# Patient Record
Sex: Male | Born: 1960
Health system: Southern US, Community
[De-identification: ages and names within clinical notes are randomized; demographics above are authoritative.]

## PROBLEM LIST (undated history)

## (undated) DIAGNOSIS — R609 Edema, unspecified: Secondary | ICD-10-CM

## (undated) DIAGNOSIS — I712 Thoracic aortic aneurysm, without rupture, unspecified: Secondary | ICD-10-CM

## (undated) DIAGNOSIS — R600 Localized edema: Secondary | ICD-10-CM

## (undated) DIAGNOSIS — C3490 Malignant neoplasm of unspecified part of unspecified bronchus or lung: Secondary | ICD-10-CM

## (undated) DIAGNOSIS — I38 Endocarditis, valve unspecified: Secondary | ICD-10-CM

## (undated) DIAGNOSIS — T7840XA Allergy, unspecified, initial encounter: Secondary | ICD-10-CM

## (undated) HISTORY — DX: Thoracic aortic aneurysm, without rupture: I71.2

## (undated) HISTORY — PX: TONSILLECTOMY: SUR1361

## (undated) HISTORY — PX: ELBOW SURGERY: SHX618

## (undated) HISTORY — DX: Malignant neoplasm of unspecified part of unspecified bronchus or lung: C34.90

## (undated) HISTORY — DX: Localized edema: R60.0

## (undated) HISTORY — DX: Allergy, unspecified, initial encounter: T78.40XA

## (undated) HISTORY — DX: Endocarditis, valve unspecified: I38

## (undated) HISTORY — DX: Edema, unspecified: R60.9

## (undated) HISTORY — DX: Thoracic aortic aneurysm, without rupture, unspecified: I71.20

---

## 2009-12-03 HISTORY — PX: ELBOW SURGERY: SHX618

## 2011-01-24 ENCOUNTER — Ambulatory Visit: Payer: Self-pay | Admitting: Otolaryngology

## 2011-01-25 ENCOUNTER — Ambulatory Visit: Payer: Self-pay | Admitting: Cardiothoracic Surgery

## 2011-01-26 ENCOUNTER — Ambulatory Visit: Payer: Self-pay | Admitting: Internal Medicine

## 2011-01-30 ENCOUNTER — Ambulatory Visit: Payer: Self-pay | Admitting: Internal Medicine

## 2011-02-01 ENCOUNTER — Ambulatory Visit: Payer: Self-pay | Admitting: Cardiothoracic Surgery

## 2011-02-01 HISTORY — PX: PORTACATH PLACEMENT: SHX2246

## 2011-02-01 HISTORY — PX: OTHER SURGICAL HISTORY: SHX169

## 2011-02-02 ENCOUNTER — Ambulatory Visit: Payer: Self-pay | Admitting: Cardiothoracic Surgery

## 2011-02-07 ENCOUNTER — Inpatient Hospital Stay: Payer: Self-pay | Admitting: Cardiothoracic Surgery

## 2011-02-13 LAB — CEA: CEA: 17.2 ng/mL — ABNORMAL HIGH (ref 0.0–4.7)

## 2011-02-13 LAB — AFP TUMOR MARKER: AFP-Tumor Marker: 3.3 ng/mL (ref 0.0–8.3)

## 2011-02-13 LAB — PSA

## 2011-02-13 LAB — BETA HCG QUANT (REF LAB)

## 2011-02-26 ENCOUNTER — Ambulatory Visit: Payer: Self-pay | Admitting: Otolaryngology

## 2011-03-04 ENCOUNTER — Ambulatory Visit: Payer: Self-pay | Admitting: Cardiothoracic Surgery

## 2011-04-03 ENCOUNTER — Ambulatory Visit: Payer: Self-pay | Admitting: Cardiothoracic Surgery

## 2011-05-04 ENCOUNTER — Ambulatory Visit: Payer: Self-pay | Admitting: Cardiothoracic Surgery

## 2011-06-03 ENCOUNTER — Ambulatory Visit: Payer: Self-pay | Admitting: Cardiothoracic Surgery

## 2011-07-04 ENCOUNTER — Ambulatory Visit: Payer: Self-pay | Admitting: Cardiothoracic Surgery

## 2011-08-04 ENCOUNTER — Ambulatory Visit: Payer: Self-pay | Admitting: Cardiothoracic Surgery

## 2011-09-03 ENCOUNTER — Ambulatory Visit: Payer: Self-pay | Admitting: Cardiothoracic Surgery

## 2011-10-04 ENCOUNTER — Ambulatory Visit: Payer: Self-pay | Admitting: Cardiothoracic Surgery

## 2011-11-03 ENCOUNTER — Ambulatory Visit: Payer: Self-pay | Admitting: Cardiothoracic Surgery

## 2011-12-04 ENCOUNTER — Ambulatory Visit: Payer: Self-pay | Admitting: Cardiothoracic Surgery

## 2011-12-07 LAB — CBC CANCER CENTER
Basophil #: 0 x10 3/mm (ref 0.0–0.1)
Basophil %: 0.1 %
Eosinophil #: 0 x10 3/mm (ref 0.0–0.7)
Eosinophil %: 0 %
HCT: 41 % (ref 40.0–52.0)
HGB: 14.2 g/dL (ref 13.0–18.0)
Lymphocyte #: 0.8 x10 3/mm — ABNORMAL LOW (ref 1.0–3.6)
Lymphocyte %: 10.3 %
MCH: 31.8 pg (ref 26.0–34.0)
MCV: 92 fL (ref 80–100)
Monocyte #: 0.4 x10 3/mm (ref 0.0–0.7)
Neutrophil #: 6.2 x10 3/mm (ref 1.4–6.5)
Neutrophil %: 84 %
Platelet: 176 x10 3/mm (ref 150–440)
RBC: 4.48 10*6/uL (ref 4.40–5.90)
WBC: 7.3 x10 3/mm (ref 3.8–10.6)

## 2011-12-07 LAB — BASIC METABOLIC PANEL
Anion Gap: 9 (ref 7–16)
Calcium, Total: 9.4 mg/dL (ref 8.5–10.1)
Chloride: 97 mmol/L — ABNORMAL LOW (ref 98–107)
Co2: 29 mmol/L (ref 21–32)
Creatinine: 1.15 mg/dL (ref 0.60–1.30)
EGFR (African American): >60
Osmolality: 277 (ref 275–301)
Potassium: 3.9 mmol/L (ref 3.5–5.1)
Sodium: 135 mmol/L — ABNORMAL LOW (ref 136–145)

## 2011-12-07 LAB — HEPATIC FUNCTION PANEL A (ARMC)
Albumin: 3.9 g/dL (ref 3.4–5.0)
Alkaline Phosphatase: 74 U/L (ref 50–136)
Bilirubin, Direct: 0.1 mg/dL (ref 0.00–0.20)
Bilirubin,Total: 0.5 mg/dL (ref 0.2–1.0)
Total Protein: 7.8 g/dL (ref 6.4–8.2)

## 2011-12-07 LAB — CREATININE, URINE, RANDOM: Creatinine, Urine Random: 87.3 mg/dL (ref 30.0–125.0)

## 2011-12-27 LAB — CBC CANCER CENTER
Basophil %: 0 %
Eosinophil %: 0 %
HGB: 14.6 g/dL (ref 13.0–18.0)
Lymphocyte %: 9.5 %
MCV: 92 fL (ref 80–100)
Neutrophil %: 86.6 %
RBC: 4.62 10*6/uL (ref 4.40–5.90)
RDW: 14.2 % (ref 11.5–14.5)
WBC: 7.9 x10 3/mm (ref 3.8–10.6)

## 2011-12-27 LAB — COMPREHENSIVE METABOLIC PANEL
Albumin: 3.9 g/dL (ref 3.4–5.0)
Anion Gap: 13 (ref 7–16)
BUN: 20 mg/dL — ABNORMAL HIGH (ref 7–18)
Bilirubin,Total: 0.5 mg/dL (ref 0.2–1.0)
Calcium, Total: 9.3 mg/dL (ref 8.5–10.1)
Chloride: 100 mmol/L (ref 98–107)
Co2: 24 mmol/L (ref 21–32)
EGFR (African American): >60
EGFR (Non-African Amer.): >60
Potassium: 3.9 mmol/L (ref 3.5–5.1)
SGOT(AST): 21 U/L (ref 15–37)
Sodium: 137 mmol/L (ref 136–145)

## 2011-12-27 LAB — MAGNESIUM: Magnesium: 1.8 mg/dL

## 2012-01-04 ENCOUNTER — Ambulatory Visit: Payer: Self-pay | Admitting: Cardiothoracic Surgery

## 2012-01-18 LAB — CBC CANCER CENTER
Basophil #: 0 x10 3/mm (ref 0.0–0.1)
Basophil %: 0.2 %
Eosinophil %: 0 %
HCT: 41.7 % (ref 40.0–52.0)
HGB: 14.4 g/dL (ref 13.0–18.0)
Lymphocyte #: 0.8 x10 3/mm — ABNORMAL LOW (ref 1.0–3.6)
Lymphocyte %: 10.1 %
MCHC: 34.5 g/dL (ref 32.0–36.0)
Monocyte %: 5.1 %
Neutrophil #: 6.3 x10 3/mm (ref 1.4–6.5)
Neutrophil %: 84.6 %
RBC: 4.55 10*6/uL (ref 4.40–5.90)

## 2012-01-18 LAB — URINALYSIS, COMPLETE
Bilirubin,UR: NEGATIVE
Blood: NEGATIVE
Glucose,UR: 50 mg/dL (ref 0–75)
Leukocyte Esterase: NEGATIVE
Nitrite: NEGATIVE
Ph: 6 (ref 4.5–8.0)
Squamous Epithelial: NONE SEEN

## 2012-01-18 LAB — BASIC METABOLIC PANEL
BUN: 19 mg/dL — ABNORMAL HIGH (ref 7–18)
Chloride: 99 mmol/L (ref 98–107)
Creatinine: 1.18 mg/dL (ref 0.60–1.30)
Glucose: 207 mg/dL — ABNORMAL HIGH (ref 65–99)
Osmolality: 282 (ref 275–301)
Potassium: 3.8 mmol/L (ref 3.5–5.1)
Sodium: 137 mmol/L (ref 136–145)

## 2012-01-18 LAB — HEPATIC FUNCTION PANEL A (ARMC)
Albumin: 4 g/dL (ref 3.4–5.0)
SGOT(AST): 18 U/L (ref 15–37)
SGPT (ALT): 37 U/L
Total Protein: 7.7 g/dL (ref 6.4–8.2)

## 2012-01-18 LAB — MAGNESIUM: Magnesium: 1.8 mg/dL

## 2012-01-18 LAB — PROTEIN, URINE, RANDOM: Protein, Random Urine: <5 mg/dL — ABNORMAL LOW (ref 0–12)

## 2012-02-01 ENCOUNTER — Ambulatory Visit: Payer: Self-pay | Admitting: Cardiothoracic Surgery

## 2012-02-11 LAB — BASIC METABOLIC PANEL
Calcium, Total: 9.3 mg/dL (ref 8.5–10.1)
Chloride: 96 mmol/L — ABNORMAL LOW (ref 98–107)
Glucose: 199 mg/dL — ABNORMAL HIGH (ref 65–99)
Osmolality: 278 (ref 275–301)
Sodium: 135 mmol/L — ABNORMAL LOW (ref 136–145)

## 2012-02-11 LAB — CREATININE, URINE, RANDOM: Creatinine, Urine Random: 27.8 mg/dL — ABNORMAL LOW (ref 30.0–125.0)

## 2012-02-11 LAB — CBC CANCER CENTER
Basophil %: 0 %
Eosinophil %: 0 %
HCT: 43.5 % (ref 40.0–52.0)
HGB: 14.8 g/dL (ref 13.0–18.0)
Lymphocyte #: 0.8 x10 3/mm — ABNORMAL LOW (ref 1.0–3.6)
MCH: 31.7 pg (ref 26.0–34.0)
MCHC: 34.1 g/dL (ref 32.0–36.0)
Monocyte #: 0.5 x10 3/mm (ref 0.0–0.7)
Monocyte %: 5.6 %
Neutrophil %: 86.1 %
Platelet: 181 x10 3/mm (ref 150–440)
WBC: 9.1 x10 3/mm (ref 3.8–10.6)

## 2012-02-11 LAB — HEPATIC FUNCTION PANEL A (ARMC)
Bilirubin, Direct: 0.1 mg/dL (ref 0.00–0.20)
Bilirubin,Total: 0.5 mg/dL (ref 0.2–1.0)
SGOT(AST): 20 U/L (ref 15–37)
SGPT (ALT): 38 U/L
Total Protein: 7.8 g/dL (ref 6.4–8.2)

## 2012-02-11 LAB — MAGNESIUM: Magnesium: 2 mg/dL

## 2012-03-03 ENCOUNTER — Ambulatory Visit: Payer: Self-pay | Admitting: Cardiothoracic Surgery

## 2012-03-07 LAB — CBC CANCER CENTER
Basophil #: 0 x10 3/mm (ref 0.0–0.1)
Eosinophil %: 0 %
Lymphocyte #: 0.7 x10 3/mm — ABNORMAL LOW (ref 1.0–3.6)
Lymphocyte %: 9.9 %
MCHC: 34.6 g/dL (ref 32.0–36.0)
Monocyte %: 5.3 %
Neutrophil #: 5.6 x10 3/mm (ref 1.4–6.5)
Neutrophil %: 84.6 %
Platelet: 176 x10 3/mm (ref 150–440)
RBC: 4.44 10*6/uL (ref 4.40–5.90)
RDW: 13.7 % (ref 11.5–14.5)
WBC: 6.6 x10 3/mm (ref 3.8–10.6)

## 2012-03-07 LAB — HEPATIC FUNCTION PANEL A (ARMC)
Albumin: 3.9 g/dL (ref 3.4–5.0)
SGOT(AST): 23 U/L (ref 15–37)
Total Protein: 7.4 g/dL (ref 6.4–8.2)

## 2012-03-07 LAB — BASIC METABOLIC PANEL
Anion Gap: 12 (ref 7–16)
BUN: 16 mg/dL (ref 7–18)
Chloride: 101 mmol/L (ref 98–107)
Co2: 24 mmol/L (ref 21–32)
Creatinine: 1.14 mg/dL (ref 0.60–1.30)
Glucose: 164 mg/dL — ABNORMAL HIGH (ref 65–99)
Sodium: 137 mmol/L (ref 136–145)

## 2012-03-28 LAB — MAGNESIUM: Magnesium: 2.8 mg/dL — ABNORMAL HIGH

## 2012-03-28 LAB — CBC CANCER CENTER
Basophil #: 0 x10 3/mm (ref 0.0–0.1)
Basophil %: 0.1 %
Eosinophil %: 0.1 %
MCHC: 33 g/dL (ref 32.0–36.0)
Neutrophil #: 5.3 x10 3/mm (ref 1.4–6.5)
Neutrophil %: 85.7 %
RBC: 4.59 10*6/uL (ref 4.40–5.90)
RDW: 13.2 % (ref 11.5–14.5)
WBC: 6.2 x10 3/mm (ref 3.8–10.6)

## 2012-03-28 LAB — CREATININE, URINE, RANDOM: Creatinine, Urine Random: 56.2 mg/dL (ref 30.0–125.0)

## 2012-03-28 LAB — HEPATIC FUNCTION PANEL A (ARMC)
Albumin: 4 g/dL (ref 3.4–5.0)
SGOT(AST): 25 U/L (ref 15–37)
SGPT (ALT): 37 U/L
Total Protein: 7.4 g/dL (ref 6.4–8.2)

## 2012-03-28 LAB — BASIC METABOLIC PANEL
Co2: 27 mmol/L (ref 21–32)
EGFR (African American): >60
EGFR (Non-African Amer.): >60
Osmolality: 281 (ref 275–301)

## 2012-04-02 ENCOUNTER — Ambulatory Visit: Payer: Self-pay | Admitting: Cardiothoracic Surgery

## 2012-04-18 LAB — HEPATIC FUNCTION PANEL A (ARMC)
Bilirubin, Direct: 0.1 mg/dL (ref 0.00–0.20)
Bilirubin,Total: 0.5 mg/dL (ref 0.2–1.0)
SGPT (ALT): 38 U/L
Total Protein: 7.5 g/dL (ref 6.4–8.2)

## 2012-04-18 LAB — CBC CANCER CENTER
Basophil %: 0.3 %
Eosinophil %: 0 %
HGB: 13.8 g/dL (ref 13.0–18.0)
Lymphocyte %: 9.7 %
MCH: 30.9 pg (ref 26.0–34.0)
MCHC: 33.4 g/dL (ref 32.0–36.0)
MCV: 93 fL (ref 80–100)
Neutrophil #: 6.2 x10 3/mm (ref 1.4–6.5)
RBC: 4.46 10*6/uL (ref 4.40–5.90)

## 2012-04-18 LAB — BASIC METABOLIC PANEL
Chloride: 100 mmol/L (ref 98–107)
EGFR (African American): >60
EGFR (Non-African Amer.): >60

## 2012-05-03 ENCOUNTER — Ambulatory Visit: Payer: Self-pay | Admitting: Cardiothoracic Surgery

## 2012-05-09 LAB — HEPATIC FUNCTION PANEL A (ARMC)
Alkaline Phosphatase: 64 U/L (ref 50–136)
Bilirubin, Direct: 0.1 mg/dL (ref 0.00–0.20)
SGOT(AST): 19 U/L (ref 15–37)
Total Protein: 7.4 g/dL (ref 6.4–8.2)

## 2012-05-09 LAB — CBC CANCER CENTER
Basophil #: 0 x10 3/mm (ref 0.0–0.1)
Eosinophil #: 0 x10 3/mm (ref 0.0–0.7)
HCT: 41.1 % (ref 40.0–52.0)
HGB: 13.7 g/dL (ref 13.0–18.0)
Lymphocyte #: 0.6 x10 3/mm — ABNORMAL LOW (ref 1.0–3.6)
MCH: 30.9 pg (ref 26.0–34.0)
Monocyte %: 3.3 %
Neutrophil #: 6.8 x10 3/mm — ABNORMAL HIGH (ref 1.4–6.5)
Platelet: 158 x10 3/mm (ref 150–440)
WBC: 7.7 x10 3/mm (ref 3.8–10.6)

## 2012-05-09 LAB — BASIC METABOLIC PANEL WITH GFR
Anion Gap: 11
BUN: 19 mg/dL — ABNORMAL HIGH
Calcium, Total: 9.4 mg/dL
Chloride: 99 mmol/L
Co2: 25 mmol/L
Creatinine: 1.11 mg/dL
EGFR (African American): >60
EGFR (Non-African Amer.): >60
Glucose: 168 mg/dL — ABNORMAL HIGH
Osmolality: 276
Potassium: 4.1 mmol/L
Sodium: 135 mmol/L — ABNORMAL LOW

## 2012-05-09 LAB — MAGNESIUM: Magnesium: 2 mg/dL

## 2012-05-09 LAB — CREATININE, URINE, RANDOM: Creatinine, Urine Random: 14.1 mg/dL — ABNORMAL LOW (ref 30.0–125.0)

## 2012-05-09 LAB — PROTEIN, URINE, RANDOM: Protein, Random Urine: <5 mg/dL — ABNORMAL LOW (ref 0–12)

## 2012-05-30 LAB — CBC CANCER CENTER
Basophil #: 0 x10 3/mm (ref 0.0–0.1)
Basophil %: 0.1 %
Eosinophil #: 0 x10 3/mm (ref 0.0–0.7)
Eosinophil %: 0 %
HGB: 13 g/dL (ref 13.0–18.0)
Lymphocyte #: 0.5 x10 3/mm — ABNORMAL LOW (ref 1.0–3.6)
Lymphocyte %: 8.2 %
MCH: 31.2 pg (ref 26.0–34.0)
MCHC: 33.4 g/dL (ref 32.0–36.0)
Monocyte %: 3.7 %
Neutrophil %: 88 %
Platelet: 144 x10 3/mm — ABNORMAL LOW (ref 150–440)
RBC: 4.18 10*6/uL — ABNORMAL LOW (ref 4.40–5.90)
RDW: 13.9 % (ref 11.5–14.5)
WBC: 6.4 x10 3/mm (ref 3.8–10.6)

## 2012-05-30 LAB — BASIC METABOLIC PANEL
Anion Gap: 10 (ref 7–16)
BUN: 21 mg/dL — ABNORMAL HIGH (ref 7–18)
Calcium, Total: 8.8 mg/dL (ref 8.5–10.1)
Creatinine: 1.18 mg/dL (ref 0.60–1.30)
EGFR (Non-African Amer.): >60
Osmolality: 278 (ref 275–301)
Potassium: 3.9 mmol/L (ref 3.5–5.1)
Sodium: 135 mmol/L — ABNORMAL LOW (ref 136–145)

## 2012-05-30 LAB — PROTEIN, URINE, RANDOM: Protein, Random Urine: <5 mg/dL — ABNORMAL LOW (ref 0–12)

## 2012-05-30 LAB — HEPATIC FUNCTION PANEL A (ARMC)
Albumin: 3.7 g/dL (ref 3.4–5.0)
Bilirubin,Total: 0.4 mg/dL (ref 0.2–1.0)
SGOT(AST): 19 U/L (ref 15–37)
SGPT (ALT): 30 U/L

## 2012-06-02 ENCOUNTER — Ambulatory Visit: Payer: Self-pay | Admitting: Cardiothoracic Surgery

## 2012-06-20 LAB — CBC CANCER CENTER
Basophil #: 0 x10 3/mm (ref 0.0–0.1)
Eosinophil %: 0 %
Lymphocyte #: 0.6 x10 3/mm — ABNORMAL LOW (ref 1.0–3.6)
Lymphocyte %: 8.7 %
MCH: 31.9 pg (ref 26.0–34.0)
MCV: 94 fL (ref 80–100)
Monocyte #: 0.3 x10 3/mm (ref 0.2–1.0)
Neutrophil %: 87.5 %
Platelet: 170 x10 3/mm (ref 150–440)
RDW: 13.8 % (ref 11.5–14.5)

## 2012-06-20 LAB — BASIC METABOLIC PANEL
Anion Gap: 9 (ref 7–16)
Calcium, Total: 8.9 mg/dL (ref 8.5–10.1)
Co2: 25 mmol/L (ref 21–32)
EGFR (African American): >60
EGFR (Non-African Amer.): >60
Glucose: 170 mg/dL — ABNORMAL HIGH (ref 65–99)
Osmolality: 277 (ref 275–301)
Potassium: 3.8 mmol/L (ref 3.5–5.1)
Sodium: 136 mmol/L (ref 136–145)

## 2012-06-20 LAB — CREATININE, URINE, RANDOM: Creatinine, Urine Random: 31.2 mg/dL (ref 30.0–125.0)

## 2012-06-20 LAB — HEPATIC FUNCTION PANEL A (ARMC): Bilirubin, Direct: 0.1 mg/dL (ref 0.00–0.20)

## 2012-07-03 ENCOUNTER — Ambulatory Visit: Payer: Self-pay | Admitting: Cardiothoracic Surgery

## 2012-07-11 LAB — HEPATIC FUNCTION PANEL A (ARMC)
Albumin: 3.9 g/dL (ref 3.4–5.0)
Alkaline Phosphatase: 70 U/L (ref 50–136)
Bilirubin,Total: 0.6 mg/dL (ref 0.2–1.0)
SGOT(AST): 22 U/L (ref 15–37)
Total Protein: 7.4 g/dL (ref 6.4–8.2)

## 2012-07-11 LAB — BASIC METABOLIC PANEL
Chloride: 100 mmol/L (ref 98–107)
Co2: 24 mmol/L (ref 21–32)
Creatinine: 1.24 mg/dL (ref 0.60–1.30)
Potassium: 3.8 mmol/L (ref 3.5–5.1)
Sodium: 136 mmol/L (ref 136–145)

## 2012-07-11 LAB — CBC CANCER CENTER
Basophil #: 0 x10 3/mm (ref 0.0–0.1)
Basophil %: 0.3 %
Eosinophil #: 0 x10 3/mm (ref 0.0–0.7)
Eosinophil %: 0.1 %
HCT: 40.3 % (ref 40.0–52.0)
HGB: 13.8 g/dL (ref 13.0–18.0)
MCH: 32.7 pg (ref 26.0–34.0)
MCHC: 34.3 g/dL (ref 32.0–36.0)
MCV: 95 fL (ref 80–100)
Monocyte #: 0.3 x10 3/mm (ref 0.2–1.0)
Neutrophil #: 5.6 x10 3/mm (ref 1.4–6.5)
Platelet: 182 x10 3/mm (ref 150–440)
RBC: 4.23 10*6/uL — ABNORMAL LOW (ref 4.40–5.90)

## 2012-07-11 LAB — MAGNESIUM: Magnesium: 2 mg/dL

## 2012-07-11 LAB — PROTEIN, URINE, RANDOM: Protein, Random Urine: 9 mg/dL (ref 0–12)

## 2012-08-01 LAB — BASIC METABOLIC PANEL
BUN: 19 mg/dL — ABNORMAL HIGH (ref 7–18)
Calcium, Total: 8.8 mg/dL (ref 8.5–10.1)
Chloride: 104 mmol/L (ref 98–107)
Creatinine: 1.24 mg/dL (ref 0.60–1.30)
EGFR (African American): >60
EGFR (Non-African Amer.): >60
Glucose: 170 mg/dL — ABNORMAL HIGH (ref 65–99)
Sodium: 140 mmol/L (ref 136–145)

## 2012-08-01 LAB — MAGNESIUM: Magnesium: 1.8 mg/dL

## 2012-08-01 LAB — CBC CANCER CENTER
Basophil #: 0 x10 3/mm (ref 0.0–0.1)
Eosinophil #: 0.1 x10 3/mm (ref 0.0–0.7)
Lymphocyte %: 27 %
MCHC: 33.8 g/dL (ref 32.0–36.0)
Neutrophil %: 61.6 %
Platelet: 133 x10 3/mm — ABNORMAL LOW (ref 150–440)
RDW: 13.2 % (ref 11.5–14.5)

## 2012-08-01 LAB — HEPATIC FUNCTION PANEL A (ARMC)
Albumin: 3.7 g/dL (ref 3.4–5.0)
Alkaline Phosphatase: 63 U/L (ref 50–136)
Bilirubin, Direct: 0.1 mg/dL (ref 0.00–0.20)
Bilirubin,Total: 0.4 mg/dL (ref 0.2–1.0)
SGOT(AST): 25 U/L (ref 15–37)

## 2012-08-01 LAB — PROTEIN, URINE, RANDOM: Protein, Random Urine: <5 mg/dL — ABNORMAL LOW (ref 0–12)

## 2012-08-01 LAB — CREATININE, URINE, RANDOM: Creatinine, Urine Random: 19.5 mg/dL — ABNORMAL LOW (ref 30.0–125.0)

## 2012-08-03 ENCOUNTER — Ambulatory Visit: Payer: Self-pay | Admitting: Cardiothoracic Surgery

## 2012-08-22 LAB — BASIC METABOLIC PANEL
Calcium, Total: 9.2 mg/dL (ref 8.5–10.1)
Co2: 24 mmol/L (ref 21–32)
EGFR (African American): >60
EGFR (Non-African Amer.): >60
Glucose: 101 mg/dL — ABNORMAL HIGH (ref 65–99)
Osmolality: 275 (ref 275–301)
Potassium: 3.8 mmol/L (ref 3.5–5.1)
Sodium: 137 mmol/L (ref 136–145)

## 2012-08-22 LAB — CBC CANCER CENTER
Basophil #: 0 x10 3/mm (ref 0.0–0.1)
Basophil %: 0.7 %
Eosinophil #: 0.1 x10 3/mm (ref 0.0–0.7)
Eosinophil %: 1.6 %
Lymphocyte %: 28.1 %
MCHC: 33.6 g/dL (ref 32.0–36.0)
Monocyte %: 10.6 %
Neutrophil #: 3.4 x10 3/mm (ref 1.4–6.5)
Neutrophil %: 59 %
Platelet: 167 x10 3/mm (ref 150–440)
RDW: 13.5 % (ref 11.5–14.5)
WBC: 5.8 x10 3/mm (ref 3.8–10.6)

## 2012-08-22 LAB — HEPATIC FUNCTION PANEL A (ARMC)
Albumin: 3.7 g/dL (ref 3.4–5.0)
Alkaline Phosphatase: 55 U/L (ref 50–136)
SGOT(AST): 23 U/L (ref 15–37)
SGPT (ALT): 23 U/L (ref 12–78)

## 2012-08-22 LAB — PROTEIN, URINE, RANDOM: Protein, Random Urine: <5 mg/dL — ABNORMAL LOW (ref 0–12)

## 2012-09-02 ENCOUNTER — Ambulatory Visit: Payer: Self-pay | Admitting: Cardiothoracic Surgery

## 2012-09-12 LAB — CBC CANCER CENTER
Basophil %: 0.5 %
Eosinophil #: 0.2 x10 3/mm (ref 0.0–0.7)
HGB: 13.6 g/dL (ref 13.0–18.0)
Lymphocyte #: 1.4 x10 3/mm (ref 1.0–3.6)
Lymphocyte %: 24.6 %
MCH: 31.6 pg (ref 26.0–34.0)
MCHC: 33 g/dL (ref 32.0–36.0)
MCV: 96 fL (ref 80–100)
Monocyte %: 10.4 %
Neutrophil #: 3.6 x10 3/mm (ref 1.4–6.5)
Platelet: 129 x10 3/mm — ABNORMAL LOW (ref 150–440)
RBC: 4.32 10*6/uL — ABNORMAL LOW (ref 4.40–5.90)
WBC: 5.9 x10 3/mm (ref 3.8–10.6)

## 2012-09-12 LAB — BASIC METABOLIC PANEL
BUN: 15 mg/dL (ref 7–18)
Calcium, Total: 9.2 mg/dL (ref 8.5–10.1)
Creatinine: 1.07 mg/dL (ref 0.60–1.30)
EGFR (African American): >60
EGFR (Non-African Amer.): >60
Glucose: 103 mg/dL — ABNORMAL HIGH (ref 65–99)
Osmolality: 275 (ref 275–301)
Potassium: 3.8 mmol/L (ref 3.5–5.1)
Sodium: 137 mmol/L (ref 136–145)

## 2012-09-12 LAB — HEPATIC FUNCTION PANEL A (ARMC)
Albumin: 3.7 g/dL (ref 3.4–5.0)
Alkaline Phosphatase: 54 U/L (ref 50–136)
Bilirubin, Direct: 0.1 mg/dL (ref 0.00–0.20)
Bilirubin,Total: 0.4 mg/dL (ref 0.2–1.0)
Total Protein: 7.3 g/dL (ref 6.4–8.2)

## 2012-09-12 LAB — PROTEIN, URINE, RANDOM: Protein, Random Urine: <5 mg/dL — ABNORMAL LOW (ref 0–12)

## 2012-09-12 LAB — CREATININE, URINE, RANDOM: Creatinine, Urine Random: 14.8 mg/dL — ABNORMAL LOW (ref 30.0–125.0)

## 2012-10-03 ENCOUNTER — Ambulatory Visit: Payer: Self-pay | Admitting: Cardiothoracic Surgery

## 2012-10-03 LAB — CBC CANCER CENTER
Basophil %: 0.7 %
Eosinophil %: 2.7 %
HCT: 42.4 % (ref 40.0–52.0)
HGB: 13.9 g/dL (ref 13.0–18.0)
Lymphocyte #: 1.4 x10 3/mm (ref 1.0–3.6)
Lymphocyte %: 29.6 %
MCH: 31 pg (ref 26.0–34.0)
Monocyte %: 11.5 %
Neutrophil #: 2.7 x10 3/mm (ref 1.4–6.5)
RBC: 4.48 10*6/uL (ref 4.40–5.90)

## 2012-10-03 LAB — BASIC METABOLIC PANEL
BUN: 12 mg/dL (ref 7–18)
Co2: 25 mmol/L (ref 21–32)
Creatinine: 1.02 mg/dL (ref 0.60–1.30)
EGFR (African American): >60
EGFR (Non-African Amer.): >60
Osmolality: 275 (ref 275–301)
Sodium: 137 mmol/L (ref 136–145)

## 2012-10-03 LAB — HEPATIC FUNCTION PANEL A (ARMC)
Albumin: 3.6 g/dL (ref 3.4–5.0)
Bilirubin,Total: 0.4 mg/dL (ref 0.2–1.0)
SGOT(AST): 21 U/L (ref 15–37)
SGPT (ALT): 30 U/L (ref 12–78)
Total Protein: 7 g/dL (ref 6.4–8.2)

## 2012-10-03 LAB — PROTEIN, URINE, RANDOM: Protein, Random Urine: <5 mg/dL — ABNORMAL LOW (ref 0–12)

## 2012-10-03 LAB — CREATININE, URINE, RANDOM: Creatinine, Urine Random: 13.8 mg/dL — ABNORMAL LOW (ref 30.0–125.0)

## 2012-10-03 LAB — MAGNESIUM: Magnesium: 1.9 mg/dL

## 2012-10-17 LAB — CBC CANCER CENTER
Basophil #: 0 x10 3/mm (ref 0.0–0.1)
Eosinophil #: 0.2 x10 3/mm (ref 0.0–0.7)
HCT: 41.2 % (ref 40.0–52.0)
Lymphocyte #: 2 x10 3/mm (ref 1.0–3.6)
MCHC: 33.4 g/dL (ref 32.0–36.0)
MCV: 94 fL (ref 80–100)
Monocyte #: 0.7 x10 3/mm (ref 0.2–1.0)
Monocyte %: 11.1 %
Neutrophil #: 3.3 x10 3/mm (ref 1.4–6.5)
Platelet: 132 x10 3/mm — ABNORMAL LOW (ref 150–440)
RDW: 13.3 % (ref 11.5–14.5)
WBC: 6.3 x10 3/mm (ref 3.8–10.6)

## 2012-10-24 LAB — CBC CANCER CENTER
Basophil #: 0 x10 3/mm (ref 0.0–0.1)
Basophil %: 0.7 %
Eosinophil %: 5.1 %
HGB: 13.7 g/dL (ref 13.0–18.0)
Lymphocyte #: 1.9 x10 3/mm (ref 1.0–3.6)
MCH: 31.5 pg (ref 26.0–34.0)
MCHC: 33.2 g/dL (ref 32.0–36.0)
MCV: 95 fL (ref 80–100)
Monocyte #: 0.6 x10 3/mm (ref 0.2–1.0)
Neutrophil #: 3.7 x10 3/mm (ref 1.4–6.5)
Platelet: 139 x10 3/mm — ABNORMAL LOW (ref 150–440)
RBC: 4.35 10*6/uL — ABNORMAL LOW (ref 4.40–5.90)
RDW: 13 % (ref 11.5–14.5)

## 2012-10-24 LAB — PROTEIN, URINE, RANDOM: Protein, Random Urine: <5 mg/dL — ABNORMAL LOW (ref 0–12)

## 2012-10-24 LAB — HEPATIC FUNCTION PANEL A (ARMC)
Bilirubin, Direct: 0.1 mg/dL (ref 0.00–0.20)
SGPT (ALT): 32 U/L (ref 12–78)

## 2012-10-24 LAB — BASIC METABOLIC PANEL
Anion Gap: 11 (ref 7–16)
BUN: 17 mg/dL (ref 7–18)
Chloride: 99 mmol/L (ref 98–107)
Creatinine: 1.15 mg/dL (ref 0.60–1.30)
EGFR (African American): >60
EGFR (Non-African Amer.): >60
Sodium: 136 mmol/L (ref 136–145)

## 2012-10-24 LAB — MAGNESIUM: Magnesium: 1.7 mg/dL — ABNORMAL LOW

## 2012-11-02 ENCOUNTER — Ambulatory Visit: Payer: Self-pay | Admitting: Cardiothoracic Surgery

## 2012-11-14 LAB — BASIC METABOLIC PANEL
Anion Gap: 8 (ref 7–16)
BUN: 23 mg/dL — ABNORMAL HIGH (ref 7–18)
Chloride: 98 mmol/L (ref 98–107)
Creatinine: 1.23 mg/dL (ref 0.60–1.30)
EGFR (African American): >60
Potassium: 4.9 mmol/L (ref 3.5–5.1)
Sodium: 137 mmol/L (ref 136–145)

## 2012-11-14 LAB — CBC CANCER CENTER
Basophil #: 0 x10 3/mm (ref 0.0–0.1)
HCT: 42.7 % (ref 40.0–52.0)
HGB: 14.8 g/dL (ref 13.0–18.0)
Lymphocyte #: 1.7 x10 3/mm (ref 1.0–3.6)
Lymphocyte %: 32.7 %
MCH: 32.1 pg (ref 26.0–34.0)
Monocyte %: 10.8 %
Platelet: 143 x10 3/mm — ABNORMAL LOW (ref 150–440)
RBC: 4.62 10*6/uL (ref 4.40–5.90)
RDW: 13.1 % (ref 11.5–14.5)
WBC: 5.2 x10 3/mm (ref 3.8–10.6)

## 2012-11-14 LAB — HEPATIC FUNCTION PANEL A (ARMC)
Albumin: 3.9 g/dL (ref 3.4–5.0)
Bilirubin,Total: 0.4 mg/dL (ref 0.2–1.0)
SGOT(AST): 34 U/L (ref 15–37)
Total Protein: 7.6 g/dL (ref 6.4–8.2)

## 2012-11-14 LAB — PROTEIN, URINE, RANDOM: Protein, Random Urine: 8 mg/dL (ref 0–12)

## 2012-11-14 LAB — MAGNESIUM: Magnesium: 2.1 mg/dL

## 2012-12-03 ENCOUNTER — Ambulatory Visit: Payer: Self-pay | Admitting: Cardiothoracic Surgery

## 2012-12-05 LAB — CBC CANCER CENTER
Basophil #: 0 x10 3/mm (ref 0.0–0.1)
Eosinophil #: 0.3 x10 3/mm (ref 0.0–0.7)
Lymphocyte #: 1.8 x10 3/mm (ref 1.0–3.6)
MCHC: 35.6 g/dL (ref 32.0–36.0)
MCV: 92 fL (ref 80–100)
Monocyte #: 0.7 x10 3/mm (ref 0.2–1.0)
Monocyte %: 10.2 %
Neutrophil %: 60.1 %
RBC: 4.41 10*6/uL (ref 4.40–5.90)
RDW: 13.2 % (ref 11.5–14.5)
WBC: 7.1 x10 3/mm (ref 3.8–10.6)

## 2012-12-05 LAB — HEPATIC FUNCTION PANEL A (ARMC)
Alkaline Phosphatase: 67 U/L (ref 50–136)
SGPT (ALT): 42 U/L (ref 12–78)

## 2012-12-05 LAB — BASIC METABOLIC PANEL
Anion Gap: 10 (ref 7–16)
BUN: 20 mg/dL — ABNORMAL HIGH (ref 7–18)
Calcium, Total: 8.8 mg/dL (ref 8.5–10.1)
Chloride: 98 mmol/L (ref 98–107)
Co2: 28 mmol/L (ref 21–32)
Creatinine: 1.21 mg/dL (ref 0.60–1.30)
EGFR (African American): >60
Glucose: 173 mg/dL — ABNORMAL HIGH (ref 65–99)

## 2012-12-05 LAB — PROTEIN, URINE, RANDOM: Protein, Random Urine: <5 mg/dL — ABNORMAL LOW (ref 0–12)

## 2012-12-09 LAB — HEMOGLOBIN A1C: Hemoglobin A1C: 6.2 % (ref 4.2–6.3)

## 2012-12-25 LAB — PROTEIN, URINE, RANDOM: Protein, Random Urine: <5 mg/dL — ABNORMAL LOW (ref 0–12)

## 2012-12-25 LAB — CBC CANCER CENTER
Basophil #: 0.1 x10 3/mm (ref 0.0–0.1)
Eosinophil #: 0.2 x10 3/mm (ref 0.0–0.7)
HCT: 41.4 % (ref 40.0–52.0)
HGB: 14.2 g/dL (ref 13.0–18.0)
Lymphocyte %: 25.9 %
MCH: 31.6 pg (ref 26.0–34.0)
MCHC: 34.4 g/dL (ref 32.0–36.0)
MCV: 92 fL (ref 80–100)
Monocyte %: 9.8 %
Neutrophil #: 3.6 x10 3/mm (ref 1.4–6.5)
Neutrophil %: 59.2 %
RDW: 13 % (ref 11.5–14.5)
WBC: 6.2 x10 3/mm (ref 3.8–10.6)

## 2012-12-25 LAB — CREATININE, URINE, RANDOM: Creatinine, Urine Random: 11.9 mg/dL — ABNORMAL LOW (ref 30.0–125.0)

## 2012-12-25 LAB — HEPATIC FUNCTION PANEL A (ARMC)
Bilirubin,Total: 0.4 mg/dL (ref 0.2–1.0)
SGPT (ALT): 38 U/L (ref 12–78)
Total Protein: 7.4 g/dL (ref 6.4–8.2)

## 2012-12-25 LAB — BASIC METABOLIC PANEL
Anion Gap: 5 — ABNORMAL LOW (ref 7–16)
Calcium, Total: 9.3 mg/dL (ref 8.5–10.1)
Chloride: 102 mmol/L (ref 98–107)
Co2: 33 mmol/L — ABNORMAL HIGH (ref 21–32)
EGFR (Non-African Amer.): >60
Osmolality: 280 (ref 275–301)

## 2013-01-03 ENCOUNTER — Ambulatory Visit: Payer: Self-pay | Admitting: Cardiothoracic Surgery

## 2013-01-16 LAB — BASIC METABOLIC PANEL
Anion Gap: 10 (ref 7–16)
Calcium, Total: 9.2 mg/dL (ref 8.5–10.1)
Chloride: 100 mmol/L (ref 98–107)
Co2: 29 mmol/L (ref 21–32)
Creatinine: 1.18 mg/dL (ref 0.60–1.30)
EGFR (African American): >60
EGFR (Non-African Amer.): >60
Glucose: 122 mg/dL — ABNORMAL HIGH (ref 65–99)
Osmolality: 281 (ref 275–301)
Potassium: 4 mmol/L (ref 3.5–5.1)

## 2013-01-16 LAB — CBC CANCER CENTER
Basophil #: 0 x10 3/mm (ref 0.0–0.1)
Eosinophil #: 0.1 x10 3/mm (ref 0.0–0.7)
Eosinophil %: 1.8 %
HCT: 39.3 % — ABNORMAL LOW (ref 40.0–52.0)
HGB: 13.4 g/dL (ref 13.0–18.0)
Lymphocyte #: 1.8 x10 3/mm (ref 1.0–3.6)
Lymphocyte %: 32.4 %
MCHC: 34.2 g/dL (ref 32.0–36.0)
MCV: 92 fL (ref 80–100)
Monocyte #: 0.5 x10 3/mm (ref 0.2–1.0)
Platelet: 151 x10 3/mm (ref 150–440)
WBC: 5.7 x10 3/mm (ref 3.8–10.6)

## 2013-01-16 LAB — HEPATIC FUNCTION PANEL A (ARMC)
Bilirubin,Total: 0.4 mg/dL (ref 0.2–1.0)
SGPT (ALT): 29 U/L (ref 12–78)
Total Protein: 6.9 g/dL (ref 6.4–8.2)

## 2013-01-16 LAB — CREATININE, URINE, RANDOM: Creatinine, Urine Random: 69.5 mg/dL (ref 30.0–125.0)

## 2013-01-16 LAB — PROTEIN, URINE, RANDOM: Protein, Random Urine: 6 mg/dL (ref 0–12)

## 2013-01-31 ENCOUNTER — Ambulatory Visit: Payer: Self-pay | Admitting: Cardiothoracic Surgery

## 2013-02-06 LAB — CBC CANCER CENTER
Basophil %: 0.9 %
Eosinophil #: 0.2 x10 3/mm (ref 0.0–0.7)
HCT: 40.7 % (ref 40.0–52.0)
HGB: 13.7 g/dL (ref 13.0–18.0)
Lymphocyte #: 1.6 x10 3/mm (ref 1.0–3.6)
MCHC: 33.6 g/dL (ref 32.0–36.0)
MCV: 93 fL (ref 80–100)
Monocyte #: 0.5 x10 3/mm (ref 0.2–1.0)
Monocyte %: 10 %
Neutrophil %: 53.3 %
RBC: 4.37 10*6/uL — ABNORMAL LOW (ref 4.40–5.90)
RDW: 13.4 % (ref 11.5–14.5)
WBC: 5.2 x10 3/mm (ref 3.8–10.6)

## 2013-02-06 LAB — BASIC METABOLIC PANEL
Anion Gap: 10 (ref 7–16)
BUN: 22 mg/dL — ABNORMAL HIGH (ref 7–18)
Chloride: 100 mmol/L (ref 98–107)
Co2: 29 mmol/L (ref 21–32)
Creatinine: 1.18 mg/dL (ref 0.60–1.30)
EGFR (African American): >60
Glucose: 103 mg/dL — ABNORMAL HIGH (ref 65–99)

## 2013-02-06 LAB — HEPATIC FUNCTION PANEL A (ARMC)
Alkaline Phosphatase: 65 U/L (ref 50–136)
Bilirubin,Total: 0.4 mg/dL (ref 0.2–1.0)
SGPT (ALT): 29 U/L (ref 12–78)
Total Protein: 7.2 g/dL (ref 6.4–8.2)

## 2013-02-06 LAB — PROTEIN, URINE, RANDOM: Protein, Random Urine: <5 mg/dL — ABNORMAL LOW (ref 0–12)

## 2013-02-27 LAB — HEPATIC FUNCTION PANEL A (ARMC)
Alkaline Phosphatase: 57 U/L (ref 50–136)
SGPT (ALT): 30 U/L (ref 12–78)
Total Protein: 6.8 g/dL (ref 6.4–8.2)

## 2013-02-27 LAB — BASIC METABOLIC PANEL
BUN: 21 mg/dL — ABNORMAL HIGH (ref 7–18)
Calcium, Total: 8.7 mg/dL (ref 8.5–10.1)
Chloride: 101 mmol/L (ref 98–107)
Co2: 30 mmol/L (ref 21–32)
Creatinine: 1.28 mg/dL (ref 0.60–1.30)
Potassium: 4 mmol/L (ref 3.5–5.1)
Sodium: 137 mmol/L (ref 136–145)

## 2013-02-27 LAB — CBC CANCER CENTER
Basophil %: 1.2 %
Eosinophil %: 4.1 %
HGB: 13.5 g/dL (ref 13.0–18.0)
Lymphocyte #: 1.9 x10 3/mm (ref 1.0–3.6)
MCH: 31.5 pg (ref 26.0–34.0)
Monocyte #: 0.6 x10 3/mm (ref 0.2–1.0)
Neutrophil #: 3.1 x10 3/mm (ref 1.4–6.5)
Neutrophil %: 52.7 %
RBC: 4.28 10*6/uL — ABNORMAL LOW (ref 4.40–5.90)
WBC: 5.9 x10 3/mm (ref 3.8–10.6)

## 2013-02-27 LAB — CREATININE, URINE, RANDOM: Creatinine, Urine Random: 70.1 mg/dL (ref 30.0–125.0)

## 2013-03-03 ENCOUNTER — Ambulatory Visit: Payer: Self-pay | Admitting: Cardiothoracic Surgery

## 2013-03-19 LAB — BASIC METABOLIC PANEL
Anion Gap: 8 (ref 7–16)
BUN: 16 mg/dL (ref 7–18)
Co2: 31 mmol/L (ref 21–32)
Glucose: 86 mg/dL (ref 65–99)
Potassium: 4 mmol/L (ref 3.5–5.1)
Sodium: 140 mmol/L (ref 136–145)

## 2013-03-19 LAB — CBC CANCER CENTER
Basophil #: 0 x10 3/mm (ref 0.0–0.1)
Basophil %: 0.6 %
HCT: 43.1 % (ref 40.0–52.0)
HGB: 14.6 g/dL (ref 13.0–18.0)
Lymphocyte #: 1.5 x10 3/mm (ref 1.0–3.6)
Lymphocyte %: 28 %
MCH: 31.4 pg (ref 26.0–34.0)
Monocyte %: 10.7 %
RBC: 4.65 10*6/uL (ref 4.40–5.90)
RDW: 13.5 % (ref 11.5–14.5)

## 2013-03-19 LAB — URINALYSIS, COMPLETE
Bacteria: NONE SEEN
Bilirubin,UR: NEGATIVE
Glucose,UR: NEGATIVE mg/dL (ref 0–75)
Ketone: NEGATIVE
Leukocyte Esterase: NEGATIVE
Protein: NEGATIVE
RBC,UR: NONE SEEN /HPF (ref 0–5)
Specific Gravity: 1.004 (ref 1.003–1.030)
Squamous Epithelial: NONE SEEN

## 2013-03-19 LAB — HEPATIC FUNCTION PANEL A (ARMC)
Albumin: 3.8 g/dL (ref 3.4–5.0)
Bilirubin, Direct: 0.1 mg/dL (ref 0.00–0.20)
SGOT(AST): 19 U/L (ref 15–37)
Total Protein: 7.2 g/dL (ref 6.4–8.2)

## 2013-03-19 LAB — PROTEIN, URINE, RANDOM: Protein, Random Urine: <5 mg/dL — ABNORMAL LOW (ref 0–12)

## 2013-04-02 ENCOUNTER — Ambulatory Visit: Payer: Self-pay | Admitting: Cardiothoracic Surgery

## 2013-04-10 LAB — MAGNESIUM: Magnesium: 1.6 mg/dL — ABNORMAL LOW

## 2013-04-10 LAB — BASIC METABOLIC PANEL
BUN: 23 mg/dL — ABNORMAL HIGH (ref 7–18)
Co2: 30 mmol/L (ref 21–32)
Creatinine: 1.09 mg/dL (ref 0.60–1.30)
EGFR (African American): >60
EGFR (Non-African Amer.): >60
Glucose: 97 mg/dL (ref 65–99)
Osmolality: 276 (ref 275–301)
Sodium: 136 mmol/L (ref 136–145)

## 2013-04-10 LAB — CBC CANCER CENTER
Basophil #: 0 x10 3/mm (ref 0.0–0.1)
Eosinophil #: 0.1 x10 3/mm (ref 0.0–0.7)
Eosinophil %: 1.5 %
HGB: 14 g/dL (ref 13.0–18.0)
Lymphocyte #: 1.4 x10 3/mm (ref 1.0–3.6)
MCH: 31.6 pg (ref 26.0–34.0)
MCHC: 34.5 g/dL (ref 32.0–36.0)
MCV: 92 fL (ref 80–100)
Neutrophil #: 5.1 x10 3/mm (ref 1.4–6.5)
RBC: 4.44 10*6/uL (ref 4.40–5.90)
RDW: 13.9 % (ref 11.5–14.5)
WBC: 7.2 x10 3/mm (ref 3.8–10.6)

## 2013-04-10 LAB — HEPATIC FUNCTION PANEL A (ARMC)
Bilirubin, Direct: <0.05 mg/dL (ref 0.00–0.20)
Bilirubin,Total: 0.4 mg/dL (ref 0.2–1.0)

## 2013-04-10 LAB — CREATININE, URINE, RANDOM: Creatinine, Urine Random: 39.7 mg/dL (ref 30.0–125.0)

## 2013-05-01 LAB — HEPATIC FUNCTION PANEL A (ARMC)
Albumin: 3.5 g/dL (ref 3.4–5.0)
Bilirubin, Direct: 0.1 mg/dL (ref 0.00–0.20)
Bilirubin,Total: 0.5 mg/dL (ref 0.2–1.0)
SGOT(AST): 29 U/L (ref 15–37)
Total Protein: 6.7 g/dL (ref 6.4–8.2)

## 2013-05-01 LAB — CBC CANCER CENTER
Basophil #: 0 x10 3/mm (ref 0.0–0.1)
Basophil %: 0.8 %
Eosinophil %: 4 %
HCT: 39.1 % — ABNORMAL LOW (ref 40.0–52.0)
Lymphocyte #: 1.6 x10 3/mm (ref 1.0–3.6)
Lymphocyte %: 28.8 %
MCHC: 34.3 g/dL (ref 32.0–36.0)
MCV: 92 fL (ref 80–100)
Monocyte %: 9.6 %
Neutrophil %: 56.8 %
RDW: 13.5 % (ref 11.5–14.5)
WBC: 5.7 x10 3/mm (ref 3.8–10.6)

## 2013-05-01 LAB — BASIC METABOLIC PANEL
Anion Gap: 4 — ABNORMAL LOW (ref 7–16)
BUN: 24 mg/dL — ABNORMAL HIGH (ref 7–18)
Calcium, Total: 9 mg/dL (ref 8.5–10.1)
Chloride: 100 mmol/L (ref 98–107)
Creatinine: 1.16 mg/dL (ref 0.60–1.30)
EGFR (African American): >60
Glucose: 120 mg/dL — ABNORMAL HIGH (ref 65–99)
Potassium: 3.8 mmol/L (ref 3.5–5.1)

## 2013-05-01 LAB — MAGNESIUM: Magnesium: 2 mg/dL

## 2013-05-01 LAB — CREATININE, URINE, RANDOM: Creatinine, Urine Random: 53 mg/dL (ref 30.0–125.0)

## 2013-05-03 ENCOUNTER — Ambulatory Visit: Payer: Self-pay | Admitting: Cardiothoracic Surgery

## 2013-05-20 LAB — BASIC METABOLIC PANEL
Anion Gap: 7 (ref 7–16)
Calcium, Total: 9 mg/dL (ref 8.5–10.1)
Co2: 28 mmol/L (ref 21–32)
EGFR (African American): >60
EGFR (Non-African Amer.): >60
Potassium: 3.9 mmol/L (ref 3.5–5.1)

## 2013-05-20 LAB — HEPATIC FUNCTION PANEL A (ARMC)
Alkaline Phosphatase: 60 U/L (ref 50–136)
Bilirubin, Direct: 0.1 mg/dL (ref 0.00–0.20)
Bilirubin,Total: 0.4 mg/dL (ref 0.2–1.0)
SGOT(AST): 21 U/L (ref 15–37)
SGPT (ALT): 30 U/L (ref 12–78)
Total Protein: 6.8 g/dL (ref 6.4–8.2)

## 2013-05-20 LAB — CBC CANCER CENTER
Basophil #: 0 x10 3/mm (ref 0.0–0.1)
Eosinophil #: 0.2 x10 3/mm (ref 0.0–0.7)
Eosinophil %: 3.7 %
HCT: 39 % — ABNORMAL LOW (ref 40.0–52.0)
HGB: 13.4 g/dL (ref 13.0–18.0)
Lymphocyte #: 1.6 x10 3/mm (ref 1.0–3.6)
MCH: 31.7 pg (ref 26.0–34.0)
MCV: 92 fL (ref 80–100)
Monocyte %: 8.7 %
RDW: 14 % (ref 11.5–14.5)
WBC: 6.5 x10 3/mm (ref 3.8–10.6)

## 2013-05-20 LAB — CREATININE, URINE, RANDOM: Creatinine, Urine Random: 38.8 mg/dL (ref 30.0–125.0)

## 2013-05-20 LAB — MAGNESIUM: Magnesium: 1.9 mg/dL

## 2013-06-02 ENCOUNTER — Ambulatory Visit: Payer: Self-pay | Admitting: Cardiothoracic Surgery

## 2013-06-12 LAB — CBC CANCER CENTER
Basophil #: 0.1 x10 3/mm (ref 0.0–0.1)
Basophil %: 1.1 %
Eosinophil %: 4.4 %
HCT: 38.9 % — ABNORMAL LOW (ref 40.0–52.0)
HGB: 13.8 g/dL (ref 13.0–18.0)
Lymphocyte #: 1.6 x10 3/mm (ref 1.0–3.6)
Lymphocyte %: 28.6 %
MCHC: 35.6 g/dL (ref 32.0–36.0)
MCV: 92 fL (ref 80–100)
Neutrophil %: 57.1 %
RDW: 13.4 % (ref 11.5–14.5)
WBC: 5.8 x10 3/mm (ref 3.8–10.6)

## 2013-06-12 LAB — CREATININE, URINE, RANDOM: Creatinine, Urine Random: 77.4 mg/dL (ref 30.0–125.0)

## 2013-06-12 LAB — HEPATIC FUNCTION PANEL A (ARMC)
Alkaline Phosphatase: 75 U/L (ref 50–136)
SGOT(AST): 21 U/L (ref 15–37)
SGPT (ALT): 33 U/L (ref 12–78)
Total Protein: 6.9 g/dL (ref 6.4–8.2)

## 2013-06-12 LAB — BASIC METABOLIC PANEL
Calcium, Total: 8.6 mg/dL (ref 8.5–10.1)
Co2: 29 mmol/L (ref 21–32)
EGFR (African American): >60
EGFR (Non-African Amer.): >60
Glucose: 100 mg/dL — ABNORMAL HIGH (ref 65–99)
Sodium: 139 mmol/L (ref 136–145)

## 2013-06-12 LAB — PROTEIN, URINE, RANDOM: Protein, Random Urine: <5 mg/dL — ABNORMAL LOW (ref 0–12)

## 2013-06-12 LAB — MAGNESIUM: Magnesium: 2.1 mg/dL

## 2013-07-03 ENCOUNTER — Ambulatory Visit: Payer: Self-pay | Admitting: Cardiothoracic Surgery

## 2013-07-06 LAB — CBC CANCER CENTER
Basophil #: 0 x10 3/mm (ref 0.0–0.1)
Basophil %: 0.8 %
Eosinophil #: 0.1 x10 3/mm (ref 0.0–0.7)
Eosinophil %: 2 %
Lymphocyte #: 1.8 x10 3/mm (ref 1.0–3.6)
Lymphocyte %: 31.6 %
Monocyte #: 0.6 x10 3/mm (ref 0.2–1.0)
Monocyte %: 11.2 %
Neutrophil %: 54.4 %
Platelet: 130 x10 3/mm — ABNORMAL LOW (ref 150–440)
RBC: 4.06 10*6/uL — ABNORMAL LOW (ref 4.40–5.90)
RDW: 13.7 % (ref 11.5–14.5)
WBC: 5.6 x10 3/mm (ref 3.8–10.6)

## 2013-07-06 LAB — BASIC METABOLIC PANEL
Anion Gap: 8 (ref 7–16)
BUN: 17 mg/dL (ref 7–18)
Calcium, Total: 8.7 mg/dL (ref 8.5–10.1)
Co2: 29 mmol/L (ref 21–32)
Creatinine: 1.16 mg/dL (ref 0.60–1.30)
EGFR (Non-African Amer.): >60
Glucose: 130 mg/dL — ABNORMAL HIGH (ref 65–99)
Osmolality: 277 (ref 275–301)
Sodium: 137 mmol/L (ref 136–145)

## 2013-07-06 LAB — PROTEIN, URINE, RANDOM: Protein, Random Urine: 5 mg/dL (ref 0–12)

## 2013-07-06 LAB — CREATININE, URINE, RANDOM: Creatinine, Urine Random: 61.4 mg/dL (ref 30.0–125.0)

## 2013-07-06 LAB — HEPATIC FUNCTION PANEL A (ARMC)
Albumin: 3.5 g/dL (ref 3.4–5.0)
Alkaline Phosphatase: 64 U/L (ref 50–136)
Bilirubin,Total: 0.5 mg/dL (ref 0.2–1.0)
SGPT (ALT): 27 U/L (ref 12–78)

## 2013-07-24 LAB — HEPATIC FUNCTION PANEL A (ARMC)
Albumin: 3.9 g/dL (ref 3.4–5.0)
Alkaline Phosphatase: 63 U/L (ref 50–136)
Bilirubin, Direct: 0.1 mg/dL (ref 0.00–0.20)
Bilirubin,Total: 0.4 mg/dL (ref 0.2–1.0)
SGPT (ALT): 30 U/L (ref 12–78)
Total Protein: 7.1 g/dL (ref 6.4–8.2)

## 2013-07-24 LAB — BASIC METABOLIC PANEL
Anion Gap: 11 (ref 7–16)
BUN: 23 mg/dL — ABNORMAL HIGH (ref 7–18)
Calcium, Total: 9.3 mg/dL (ref 8.5–10.1)
Chloride: 100 mmol/L (ref 98–107)
Creatinine: 1.29 mg/dL (ref 0.60–1.30)
EGFR (African American): >60
Glucose: 118 mg/dL — ABNORMAL HIGH (ref 65–99)
Sodium: 140 mmol/L (ref 136–145)

## 2013-07-24 LAB — CBC CANCER CENTER
Basophil #: 0 x10 3/mm (ref 0.0–0.1)
Basophil %: 0.3 %
Eosinophil %: 2.1 %
HCT: 40.2 % (ref 40.0–52.0)
Lymphocyte #: 1.9 x10 3/mm (ref 1.0–3.6)
Lymphocyte %: 27.8 %
MCV: 94 fL (ref 80–100)
Monocyte #: 0.5 x10 3/mm (ref 0.2–1.0)
Monocyte %: 8.2 %
Neutrophil #: 4.1 x10 3/mm (ref 1.4–6.5)
Neutrophil %: 61.6 %
RBC: 4.29 10*6/uL — ABNORMAL LOW (ref 4.40–5.90)
WBC: 6.7 x10 3/mm (ref 3.8–10.6)

## 2013-07-24 LAB — CREATININE, URINE, RANDOM: Creatinine, Urine Random: 115.2 mg/dL (ref 30.0–125.0)

## 2013-07-28 LAB — URINALYSIS, COMPLETE
Bacteria: NONE SEEN
Glucose,UR: NEGATIVE mg/dL (ref 0–75)
Nitrite: NEGATIVE
Ph: 5 (ref 4.5–8.0)
Protein: NEGATIVE
RBC,UR: <1 /HPF (ref 0–5)
Specific Gravity: 1.005 (ref 1.003–1.030)
WBC UR: <1 /HPF (ref 0–5)

## 2013-07-29 LAB — URINE CULTURE

## 2013-08-03 ENCOUNTER — Ambulatory Visit: Payer: Self-pay | Admitting: Cardiothoracic Surgery

## 2013-08-14 LAB — HEPATIC FUNCTION PANEL A (ARMC)
Albumin: 3.4 g/dL (ref 3.4–5.0)
Alkaline Phosphatase: 69 U/L (ref 50–136)
Bilirubin,Total: 0.5 mg/dL (ref 0.2–1.0)
SGOT(AST): 22 U/L (ref 15–37)
SGPT (ALT): 29 U/L (ref 12–78)
Total Protein: 6.5 g/dL (ref 6.4–8.2)

## 2013-08-14 LAB — BASIC METABOLIC PANEL
Co2: 29 mmol/L (ref 21–32)
Glucose: 153 mg/dL — ABNORMAL HIGH (ref 65–99)
Potassium: 4.2 mmol/L (ref 3.5–5.1)

## 2013-08-14 LAB — CBC CANCER CENTER
Basophil #: 0.1 x10 3/mm (ref 0.0–0.1)
Basophil %: 1.1 %
Eosinophil #: 0.2 x10 3/mm (ref 0.0–0.7)
Eosinophil %: 3.4 %
HCT: 39.9 % — ABNORMAL LOW (ref 40.0–52.0)
Lymphocyte %: 33.3 %
Monocyte %: 8.8 %
Neutrophil #: 3.1 x10 3/mm (ref 1.4–6.5)
Neutrophil %: 53.4 %
Platelet: 123 x10 3/mm — ABNORMAL LOW (ref 150–440)
RBC: 4.24 10*6/uL — ABNORMAL LOW (ref 4.40–5.90)
RDW: 13.5 % (ref 11.5–14.5)
WBC: 5.8 x10 3/mm (ref 3.8–10.6)

## 2013-08-14 LAB — PROTEIN, URINE, RANDOM: Protein, Random Urine: 9 mg/dL (ref 0–12)

## 2013-09-02 ENCOUNTER — Ambulatory Visit: Payer: Self-pay | Admitting: Cardiothoracic Surgery

## 2013-09-04 LAB — HEPATIC FUNCTION PANEL A (ARMC)
Albumin: 3.6 g/dL (ref 3.4–5.0)
Bilirubin, Direct: <0.1 mg/dL (ref 0.00–0.20)
Bilirubin,Total: 0.4 mg/dL (ref 0.2–1.0)
SGPT (ALT): 28 U/L (ref 12–78)

## 2013-09-04 LAB — CBC CANCER CENTER
Basophil #: 0 x10 3/mm (ref 0.0–0.1)
HGB: 13.9 g/dL (ref 13.0–18.0)
Lymphocyte #: 1.8 x10 3/mm (ref 1.0–3.6)
Lymphocyte %: 30.3 %
Monocyte %: 8.8 %
Neutrophil %: 57.5 %
Platelet: 135 x10 3/mm — ABNORMAL LOW (ref 150–440)
RBC: 4.26 10*6/uL — ABNORMAL LOW (ref 4.40–5.90)
RDW: 13.6 % (ref 11.5–14.5)
WBC: 5.8 x10 3/mm (ref 3.8–10.6)

## 2013-09-04 LAB — BASIC METABOLIC PANEL
Anion Gap: 5 — ABNORMAL LOW (ref 7–16)
BUN: 21 mg/dL — ABNORMAL HIGH (ref 7–18)
Calcium, Total: 8.8 mg/dL (ref 8.5–10.1)
Chloride: 104 mmol/L (ref 98–107)
Co2: 28 mmol/L (ref 21–32)
EGFR (African American): >60
Glucose: 125 mg/dL — ABNORMAL HIGH (ref 65–99)
Osmolality: 278 (ref 275–301)
Sodium: 137 mmol/L (ref 136–145)

## 2013-09-04 LAB — CREATININE, URINE, RANDOM: Creatinine, Urine Random: 64.5 mg/dL (ref 30.0–125.0)

## 2013-09-04 LAB — PROTEIN, URINE, RANDOM: Protein, Random Urine: 9 mg/dL (ref 0–12)

## 2013-09-04 LAB — MAGNESIUM: Magnesium: 1.7 mg/dL — ABNORMAL LOW

## 2013-09-25 LAB — CREATININE, URINE, RANDOM: Creatinine, Urine Random: 80.7 mg/dL (ref 30.0–125.0)

## 2013-09-25 LAB — MAGNESIUM: Magnesium: 2 mg/dL

## 2013-09-25 LAB — CBC CANCER CENTER
Basophil #: 0.1 x10 3/mm (ref 0.0–0.1)
Basophil %: 0.8 %
HCT: 40.9 % (ref 40.0–52.0)
HGB: 13.9 g/dL (ref 13.0–18.0)
Lymphocyte #: 1.8 x10 3/mm (ref 1.0–3.6)
MCH: 32.2 pg (ref 26.0–34.0)
MCHC: 34 g/dL (ref 32.0–36.0)
MCV: 95 fL (ref 80–100)
Monocyte %: 9 %
Neutrophil #: 3.8 x10 3/mm (ref 1.4–6.5)
Neutrophil %: 59.9 %
Platelet: 148 x10 3/mm — ABNORMAL LOW (ref 150–440)
RBC: 4.31 10*6/uL — ABNORMAL LOW (ref 4.40–5.90)
RDW: 13.2 % (ref 11.5–14.5)

## 2013-09-25 LAB — HEPATIC FUNCTION PANEL A (ARMC)
Bilirubin,Total: 0.4 mg/dL (ref 0.2–1.0)
SGOT(AST): 19 U/L (ref 15–37)

## 2013-09-25 LAB — BASIC METABOLIC PANEL
BUN: 22 mg/dL — ABNORMAL HIGH (ref 7–18)
Co2: 29 mmol/L (ref 21–32)
Creatinine: 1.2 mg/dL (ref 0.60–1.30)
EGFR (Non-African Amer.): >60
Glucose: 151 mg/dL — ABNORMAL HIGH (ref 65–99)
Osmolality: 280 (ref 275–301)
Sodium: 137 mmol/L (ref 136–145)

## 2013-10-03 ENCOUNTER — Ambulatory Visit: Payer: Self-pay | Admitting: Cardiothoracic Surgery

## 2013-10-03 DIAGNOSIS — I38 Endocarditis, valve unspecified: Secondary | ICD-10-CM

## 2013-10-03 HISTORY — DX: Endocarditis, valve unspecified: I38

## 2013-10-16 LAB — HEPATIC FUNCTION PANEL A (ARMC)
Alkaline Phosphatase: 51 U/L (ref 50–136)
Bilirubin, Direct: 0.1 mg/dL (ref 0.00–0.20)
SGOT(AST): 19 U/L (ref 15–37)
Total Protein: 6.8 g/dL (ref 6.4–8.2)

## 2013-10-16 LAB — CBC CANCER CENTER
Basophil #: 0 x10 3/mm (ref 0.0–0.1)
Basophil %: 0.6 %
Eosinophil %: 2.9 %
HCT: 41.6 % (ref 40.0–52.0)
HGB: 13.9 g/dL (ref 13.0–18.0)
Lymphocyte #: 1.8 x10 3/mm (ref 1.0–3.6)
MCH: 31.4 pg (ref 26.0–34.0)
MCHC: 33.3 g/dL (ref 32.0–36.0)
MCV: 94 fL (ref 80–100)
Monocyte %: 9.8 %
Neutrophil #: 3.2 x10 3/mm (ref 1.4–6.5)
Neutrophil %: 55.5 %
WBC: 5.8 x10 3/mm (ref 3.8–10.6)

## 2013-10-16 LAB — BASIC METABOLIC PANEL
Calcium, Total: 9.1 mg/dL (ref 8.5–10.1)
Chloride: 101 mmol/L (ref 98–107)
Creatinine: 1.06 mg/dL (ref 0.60–1.30)
EGFR (African American): >60
Glucose: 161 mg/dL — ABNORMAL HIGH (ref 65–99)
Potassium: 3.8 mmol/L (ref 3.5–5.1)

## 2013-10-16 LAB — CREATININE, URINE, RANDOM: Creatinine, Urine Random: 35.8 mg/dL (ref 30.0–125.0)

## 2013-11-02 ENCOUNTER — Ambulatory Visit: Payer: Self-pay | Admitting: Cardiothoracic Surgery

## 2013-11-06 LAB — CBC CANCER CENTER
Basophil %: 0.9 %
HGB: 13.7 g/dL (ref 13.0–18.0)
MCH: 31.8 pg (ref 26.0–34.0)
MCHC: 33.8 g/dL (ref 32.0–36.0)
MCV: 94 fL (ref 80–100)
Monocyte #: 0.6 x10 3/mm (ref 0.2–1.0)
Neutrophil %: 55 %
RBC: 4.32 10*6/uL — ABNORMAL LOW (ref 4.40–5.90)

## 2013-11-06 LAB — BASIC METABOLIC PANEL
Anion Gap: 5 — ABNORMAL LOW (ref 7–16)
BUN: 26 mg/dL — ABNORMAL HIGH (ref 7–18)
Calcium, Total: 9.7 mg/dL (ref 8.5–10.1)
Chloride: 101 mmol/L (ref 98–107)
Co2: 33 mmol/L — ABNORMAL HIGH (ref 21–32)
Creatinine: 1.13 mg/dL (ref 0.60–1.30)
EGFR (Non-African Amer.): >60
Glucose: 124 mg/dL — ABNORMAL HIGH (ref 65–99)
Osmolality: 284 (ref 275–301)
Potassium: 3.9 mmol/L (ref 3.5–5.1)
Sodium: 139 mmol/L (ref 136–145)

## 2013-11-06 LAB — HEPATIC FUNCTION PANEL A (ARMC)
Albumin: 3.5 g/dL (ref 3.4–5.0)
Alkaline Phosphatase: 52 U/L
SGOT(AST): 23 U/L (ref 15–37)
Total Protein: 6.9 g/dL (ref 6.4–8.2)

## 2013-11-06 LAB — PROTEIN, URINE, RANDOM: Protein, Random Urine: 8 mg/dL (ref 0–12)

## 2013-11-06 LAB — CREATININE, URINE, RANDOM: Creatinine, Urine Random: 75.9 mg/dL (ref 30.0–125.0)

## 2013-11-24 LAB — HEPATIC FUNCTION PANEL A (ARMC)
Alkaline Phosphatase: 53 U/L
Bilirubin, Direct: <0.1 mg/dL (ref 0.00–0.20)
SGOT(AST): 29 U/L (ref 15–37)
SGPT (ALT): 31 U/L (ref 12–78)

## 2013-11-24 LAB — CBC CANCER CENTER
Basophil #: 0 x10 3/mm (ref 0.0–0.1)
Eosinophil %: 4.8 %
HCT: 40.8 % (ref 40.0–52.0)
HGB: 13.9 g/dL (ref 13.0–18.0)
Lymphocyte #: 1.7 x10 3/mm (ref 1.0–3.6)
Lymphocyte %: 29.3 %
MCHC: 34 g/dL (ref 32.0–36.0)
MCV: 92 fL (ref 80–100)
Monocyte #: 0.5 x10 3/mm (ref 0.2–1.0)
Monocyte %: 8.8 %
Neutrophil #: 3.3 x10 3/mm (ref 1.4–6.5)
Neutrophil %: 56.4 %
Platelet: 135 x10 3/mm — ABNORMAL LOW (ref 150–440)

## 2013-11-24 LAB — BASIC METABOLIC PANEL
Calcium, Total: 9.4 mg/dL (ref 8.5–10.1)
Co2: 32 mmol/L (ref 21–32)
Creatinine: 1.04 mg/dL (ref 0.60–1.30)
EGFR (African American): >60
EGFR (Non-African Amer.): >60
Glucose: 146 mg/dL — ABNORMAL HIGH (ref 65–99)

## 2013-11-24 LAB — PROTEIN, URINE, RANDOM: Protein, Random Urine: <5 mg/dL — ABNORMAL LOW (ref 0–12)

## 2013-11-24 LAB — MAGNESIUM: Magnesium: 1.8 mg/dL

## 2013-12-03 ENCOUNTER — Ambulatory Visit: Payer: Self-pay | Admitting: Cardiothoracic Surgery

## 2013-12-18 LAB — CBC CANCER CENTER
Basophil #: 0.1 x10 3/mm (ref 0.0–0.1)
Basophil %: 0.9 %
Eosinophil #: 0.2 x10 3/mm (ref 0.0–0.7)
Eosinophil %: 2.6 %
HCT: 41.5 % (ref 40.0–52.0)
HGB: 13.9 g/dL (ref 13.0–18.0)
Lymphocyte #: 1.8 x10 3/mm (ref 1.0–3.6)
Lymphocyte %: 31.2 %
MCH: 31.4 pg (ref 26.0–34.0)
MCHC: 33.4 g/dL (ref 32.0–36.0)
MCV: 94 fL (ref 80–100)
MONOS PCT: 10.8 %
Monocyte #: 0.6 x10 3/mm (ref 0.2–1.0)
Neutrophil #: 3.2 x10 3/mm (ref 1.4–6.5)
Neutrophil %: 54.5 %
PLATELETS: 155 x10 3/mm (ref 150–440)
RBC: 4.42 10*6/uL (ref 4.40–5.90)
RDW: 12.8 % (ref 11.5–14.5)
WBC: 5.9 x10 3/mm (ref 3.8–10.6)

## 2013-12-18 LAB — BASIC METABOLIC PANEL
ANION GAP: 11 (ref 7–16)
BUN: 21 mg/dL — ABNORMAL HIGH (ref 7–18)
CHLORIDE: 98 mmol/L (ref 98–107)
CO2: 28 mmol/L (ref 21–32)
Calcium, Total: 8.7 mg/dL (ref 8.5–10.1)
Creatinine: 1.2 mg/dL (ref 0.60–1.30)
EGFR (African American): >60
GLUCOSE: 154 mg/dL — AB (ref 65–99)
OSMOLALITY: 280 (ref 275–301)
Potassium: 3.9 mmol/L (ref 3.5–5.1)
Sodium: 137 mmol/L (ref 136–145)

## 2013-12-18 LAB — HEPATIC FUNCTION PANEL A (ARMC)
Albumin: 3.7 g/dL (ref 3.4–5.0)
Alkaline Phosphatase: 44 U/L — ABNORMAL LOW
BILIRUBIN DIRECT: 0.1 mg/dL (ref 0.00–0.20)
Bilirubin,Total: 0.4 mg/dL (ref 0.2–1.0)
SGOT(AST): 21 U/L (ref 15–37)
SGPT (ALT): 26 U/L (ref 12–78)
Total Protein: 7 g/dL (ref 6.4–8.2)

## 2013-12-18 LAB — CREATININE, URINE, RANDOM: Creatinine, Urine Random: 37.9 mg/dL (ref 30.0–125.0)

## 2013-12-18 LAB — PROTEIN, URINE, RANDOM: PROTEIN, RANDOM URINE: 6 mg/dL (ref 0–12)

## 2013-12-18 LAB — MAGNESIUM: Magnesium: 1.9 mg/dL

## 2014-01-03 ENCOUNTER — Ambulatory Visit: Payer: Self-pay | Admitting: Cardiothoracic Surgery

## 2014-01-08 LAB — CBC CANCER CENTER
BASOS PCT: 0.9 %
Basophil #: 0.1 x10 3/mm (ref 0.0–0.1)
EOS PCT: 3.7 %
Eosinophil #: 0.2 x10 3/mm (ref 0.0–0.7)
HCT: 40.9 % (ref 40.0–52.0)
HGB: 13.9 g/dL (ref 13.0–18.0)
Lymphocyte #: 2 x10 3/mm (ref 1.0–3.6)
Lymphocyte %: 31.3 %
MCH: 31.6 pg (ref 26.0–34.0)
MCHC: 34.1 g/dL (ref 32.0–36.0)
MCV: 93 fL (ref 80–100)
Monocyte #: 0.6 x10 3/mm (ref 0.2–1.0)
Monocyte %: 9 %
Neutrophil #: 3.5 x10 3/mm (ref 1.4–6.5)
Neutrophil %: 55.1 %
PLATELETS: 151 x10 3/mm (ref 150–440)
RBC: 4.42 10*6/uL (ref 4.40–5.90)
RDW: 12.9 % (ref 11.5–14.5)
WBC: 6.4 x10 3/mm (ref 3.8–10.6)

## 2014-01-08 LAB — PROTEIN, URINE, RANDOM: Protein, Random Urine: <5 mg/dL — ABNORMAL LOW (ref 0–12)

## 2014-01-08 LAB — MAGNESIUM: MAGNESIUM: 1.9 mg/dL

## 2014-01-08 LAB — HEPATIC FUNCTION PANEL A (ARMC)
ALBUMIN: 3.6 g/dL (ref 3.4–5.0)
ALT: 28 U/L (ref 12–78)
AST: 21 U/L (ref 15–37)
Alkaline Phosphatase: 51 U/L
BILIRUBIN DIRECT: 0.1 mg/dL (ref 0.00–0.20)
Bilirubin,Total: 0.3 mg/dL (ref 0.2–1.0)
TOTAL PROTEIN: 6.9 g/dL (ref 6.4–8.2)

## 2014-01-08 LAB — BASIC METABOLIC PANEL
Anion Gap: 9 (ref 7–16)
BUN: 20 mg/dL — ABNORMAL HIGH (ref 7–18)
CREATININE: 1.13 mg/dL (ref 0.60–1.30)
Calcium, Total: 8.4 mg/dL — ABNORMAL LOW (ref 8.5–10.1)
Chloride: 101 mmol/L (ref 98–107)
Co2: 30 mmol/L (ref 21–32)
EGFR (African American): >60
EGFR (Non-African Amer.): >60
GLUCOSE: 142 mg/dL — AB (ref 65–99)
OSMOLALITY: 284 (ref 275–301)
POTASSIUM: 3.8 mmol/L (ref 3.5–5.1)
Sodium: 140 mmol/L (ref 136–145)

## 2014-01-08 LAB — CREATININE, URINE, RANDOM: Creatinine, Urine Random: 36.7 mg/dL (ref 30.0–125.0)

## 2014-01-29 LAB — CBC CANCER CENTER
BASOS ABS: 0 x10 3/mm (ref 0.0–0.1)
Basophil %: 0.8 %
EOS PCT: 3.7 %
Eosinophil #: 0.2 x10 3/mm (ref 0.0–0.7)
HCT: 39.3 % — ABNORMAL LOW (ref 40.0–52.0)
HGB: 13.3 g/dL (ref 13.0–18.0)
LYMPHS ABS: 1.9 x10 3/mm (ref 1.0–3.6)
Lymphocyte %: 35.7 %
MCH: 31.1 pg (ref 26.0–34.0)
MCHC: 33.7 g/dL (ref 32.0–36.0)
MCV: 92 fL (ref 80–100)
MONO ABS: 0.6 x10 3/mm (ref 0.2–1.0)
Monocyte %: 10.8 %
NEUTROS ABS: 2.6 x10 3/mm (ref 1.4–6.5)
Neutrophil %: 49 %
Platelet: 149 x10 3/mm — ABNORMAL LOW (ref 150–440)
RBC: 4.27 10*6/uL — ABNORMAL LOW (ref 4.40–5.90)
RDW: 13 % (ref 11.5–14.5)
WBC: 5.3 x10 3/mm (ref 3.8–10.6)

## 2014-01-29 LAB — BASIC METABOLIC PANEL
ANION GAP: 7 (ref 7–16)
BUN: 15 mg/dL (ref 7–18)
CALCIUM: 8.5 mg/dL (ref 8.5–10.1)
CREATININE: 1.15 mg/dL (ref 0.60–1.30)
Chloride: 103 mmol/L (ref 98–107)
Co2: 28 mmol/L (ref 21–32)
EGFR (African American): >60
GLUCOSE: 107 mg/dL — AB (ref 65–99)
OSMOLALITY: 277 (ref 275–301)
POTASSIUM: 4.1 mmol/L (ref 3.5–5.1)
Sodium: 138 mmol/L (ref 136–145)

## 2014-01-29 LAB — CREATININE, URINE, RANDOM: Creatinine, Urine Random: 87.9 mg/dL (ref 30.0–125.0)

## 2014-01-29 LAB — PROTEIN, URINE, RANDOM: Protein, Random Urine: 6 mg/dL (ref 0–12)

## 2014-01-29 LAB — HEPATIC FUNCTION PANEL A (ARMC)
ALBUMIN: 3.3 g/dL — AB (ref 3.4–5.0)
AST: 20 U/L (ref 15–37)
Alkaline Phosphatase: 53 U/L
BILIRUBIN DIRECT: 0.1 mg/dL (ref 0.00–0.20)
Bilirubin,Total: 0.5 mg/dL (ref 0.2–1.0)
SGPT (ALT): 26 U/L (ref 12–78)
TOTAL PROTEIN: 6.7 g/dL (ref 6.4–8.2)

## 2014-01-29 LAB — MAGNESIUM: MAGNESIUM: 1.7 mg/dL — AB

## 2014-01-31 ENCOUNTER — Ambulatory Visit: Payer: Self-pay | Admitting: Cardiothoracic Surgery

## 2014-02-19 LAB — HEPATIC FUNCTION PANEL A (ARMC)
ALT: 25 U/L (ref 12–78)
Albumin: 3.4 g/dL (ref 3.4–5.0)
Alkaline Phosphatase: 47 U/L
BILIRUBIN TOTAL: 0.4 mg/dL (ref 0.2–1.0)
Bilirubin, Direct: 0.1 mg/dL (ref 0.00–0.20)
SGOT(AST): 19 U/L (ref 15–37)
Total Protein: 6.6 g/dL (ref 6.4–8.2)

## 2014-02-19 LAB — BASIC METABOLIC PANEL
Anion Gap: 7 (ref 7–16)
BUN: 26 mg/dL — ABNORMAL HIGH (ref 7–18)
CALCIUM: 8.9 mg/dL (ref 8.5–10.1)
CHLORIDE: 101 mmol/L (ref 98–107)
Co2: 30 mmol/L (ref 21–32)
Creatinine: 1.08 mg/dL (ref 0.60–1.30)
EGFR (African American): >60
EGFR (Non-African Amer.): >60
Glucose: 141 mg/dL — ABNORMAL HIGH (ref 65–99)
OSMOLALITY: 283 (ref 275–301)
Potassium: 4 mmol/L (ref 3.5–5.1)
Sodium: 138 mmol/L (ref 136–145)

## 2014-02-19 LAB — CBC CANCER CENTER
Basophil #: 0 x10 3/mm (ref 0.0–0.1)
Basophil %: 0.9 %
EOS ABS: 0.2 x10 3/mm (ref 0.0–0.7)
Eosinophil %: 3.6 %
HCT: 40.7 % (ref 40.0–52.0)
HGB: 13.5 g/dL (ref 13.0–18.0)
LYMPHS ABS: 1.7 x10 3/mm (ref 1.0–3.6)
LYMPHS PCT: 32.2 %
MCH: 31 pg (ref 26.0–34.0)
MCHC: 33.1 g/dL (ref 32.0–36.0)
MCV: 94 fL (ref 80–100)
MONO ABS: 0.5 x10 3/mm (ref 0.2–1.0)
Monocyte %: 9.4 %
NEUTROS ABS: 2.9 x10 3/mm (ref 1.4–6.5)
Neutrophil %: 53.9 %
PLATELETS: 139 x10 3/mm — AB (ref 150–440)
RBC: 4.34 10*6/uL — AB (ref 4.40–5.90)
RDW: 13.4 % (ref 11.5–14.5)
WBC: 5.4 x10 3/mm (ref 3.8–10.6)

## 2014-02-19 LAB — CREATININE, URINE, RANDOM: Creatinine, Urine Random: 56.3 mg/dL (ref 30.0–125.0)

## 2014-02-19 LAB — PROTEIN, URINE, RANDOM: Protein, Random Urine: <5 mg/dL — ABNORMAL LOW (ref 0–12)

## 2014-02-19 LAB — MAGNESIUM: MAGNESIUM: 1.9 mg/dL

## 2014-02-22 LAB — PROTEIN, URINE, 24 HOUR
Collection Hours: 24 hours
Total Volume: 2500 mL

## 2014-03-03 ENCOUNTER — Ambulatory Visit: Payer: Self-pay | Admitting: Cardiothoracic Surgery

## 2014-03-03 ENCOUNTER — Ambulatory Visit: Payer: Self-pay | Admitting: Internal Medicine

## 2014-03-12 LAB — HEPATIC FUNCTION PANEL A (ARMC)
AST: 15 U/L (ref 15–37)
Albumin: 3.7 g/dL (ref 3.4–5.0)
Alkaline Phosphatase: 44 U/L — ABNORMAL LOW
BILIRUBIN DIRECT: 0.1 mg/dL (ref 0.00–0.20)
BILIRUBIN TOTAL: 0.4 mg/dL (ref 0.2–1.0)
SGPT (ALT): 22 U/L (ref 12–78)
Total Protein: 6.9 g/dL (ref 6.4–8.2)

## 2014-03-12 LAB — PROTEIN, URINE, RANDOM: Protein, Random Urine: 5 mg/dL (ref 0–12)

## 2014-03-12 LAB — BASIC METABOLIC PANEL
Anion Gap: 8 (ref 7–16)
BUN: 23 mg/dL — AB (ref 7–18)
CHLORIDE: 99 mmol/L (ref 98–107)
CO2: 30 mmol/L (ref 21–32)
CREATININE: 1.18 mg/dL (ref 0.60–1.30)
Calcium, Total: 9.2 mg/dL (ref 8.5–10.1)
EGFR (African American): >60
EGFR (Non-African Amer.): >60
Glucose: 146 mg/dL — ABNORMAL HIGH (ref 65–99)
Osmolality: 280 (ref 275–301)
POTASSIUM: 4.2 mmol/L (ref 3.5–5.1)
Sodium: 137 mmol/L (ref 136–145)

## 2014-03-12 LAB — CBC CANCER CENTER
Basophil #: 0 "x10 3/mm "
Basophil %: 0.9 %
Eosinophil #: 0.2 "x10 3/mm "
Eosinophil %: 3.8 %
HCT: 41.5 %
HGB: 13.9 g/dL
Lymphocyte %: 31.1 %
Lymphs Abs: 1.7 "x10 3/mm "
MCH: 31.1 pg
MCHC: 33.4 g/dL
MCV: 93 fL
Monocyte #: 0.5 "x10 3/mm "
Monocyte %: 9.7 %
Neutrophil #: 2.9 "x10 3/mm "
Neutrophil %: 54.5 %
Platelet: 137 "x10 3/mm " — ABNORMAL LOW
RBC: 4.46 "x10 6/mm "
RDW: 13.5 %
WBC: 5.3 "x10 3/mm "

## 2014-03-12 LAB — CREATININE, URINE, RANDOM: Creatinine, Urine Random: 97.2 mg/dL

## 2014-03-12 LAB — MAGNESIUM: Magnesium: 1.9 mg/dL

## 2014-03-15 LAB — LIPID PANEL
Cholesterol: 218 mg/dL — ABNORMAL HIGH (ref 0–200)
HDL Cholesterol: 42 mg/dL (ref 40–60)
LDL CHOLESTEROL, CALC: 147 mg/dL — AB (ref 0–100)
TRIGLYCERIDES: 147 mg/dL (ref 0–200)
VLDL CHOLESTEROL, CALC: 29 mg/dL (ref 5–40)

## 2014-04-02 ENCOUNTER — Ambulatory Visit: Payer: Self-pay | Admitting: Internal Medicine

## 2014-04-02 ENCOUNTER — Ambulatory Visit: Payer: Self-pay | Admitting: Cardiothoracic Surgery

## 2014-04-02 LAB — BASIC METABOLIC PANEL
Anion Gap: 7 (ref 7–16)
BUN: 26 mg/dL — ABNORMAL HIGH (ref 7–18)
CO2: 30 mmol/L (ref 21–32)
Calcium, Total: 8.7 mg/dL (ref 8.5–10.1)
Chloride: 102 mmol/L (ref 98–107)
Creatinine: 1.24 mg/dL (ref 0.60–1.30)
EGFR (African American): >60
Glucose: 138 mg/dL — ABNORMAL HIGH (ref 65–99)
OSMOLALITY: 284 (ref 275–301)
POTASSIUM: 3.9 mmol/L (ref 3.5–5.1)
SODIUM: 139 mmol/L (ref 136–145)

## 2014-04-02 LAB — CBC CANCER CENTER
BASOS PCT: 0.9 %
Basophil #: 0 x10 3/mm (ref 0.0–0.1)
EOS ABS: 0.2 x10 3/mm (ref 0.0–0.7)
Eosinophil %: 4.7 %
HCT: 40.5 % (ref 40.0–52.0)
HGB: 14 g/dL (ref 13.0–18.0)
Lymphocyte #: 1.6 x10 3/mm (ref 1.0–3.6)
Lymphocyte %: 30.9 %
MCH: 31.5 pg (ref 26.0–34.0)
MCHC: 34.5 g/dL (ref 32.0–36.0)
MCV: 91 fL (ref 80–100)
MONOS PCT: 9.9 %
Monocyte #: 0.5 x10 3/mm (ref 0.2–1.0)
NEUTROS PCT: 53.6 %
Neutrophil #: 2.8 x10 3/mm (ref 1.4–6.5)
Platelet: 140 x10 3/mm — ABNORMAL LOW (ref 150–440)
RBC: 4.44 10*6/uL (ref 4.40–5.90)
RDW: 13.7 % (ref 11.5–14.5)
WBC: 5.3 x10 3/mm (ref 3.8–10.6)

## 2014-04-02 LAB — PROTEIN, URINE, RANDOM: Protein, Random Urine: 8 mg/dL (ref 0–12)

## 2014-04-02 LAB — HEPATIC FUNCTION PANEL A (ARMC)
ALBUMIN: 3.7 g/dL (ref 3.4–5.0)
AST: 22 U/L (ref 15–37)
Alkaline Phosphatase: 48 U/L
Bilirubin, Direct: 0.1 mg/dL (ref 0.00–0.20)
Bilirubin,Total: 0.4 mg/dL (ref 0.2–1.0)
SGPT (ALT): 28 U/L (ref 12–78)
TOTAL PROTEIN: 7.1 g/dL (ref 6.4–8.2)

## 2014-04-02 LAB — MAGNESIUM: Magnesium: 1.5 mg/dL — ABNORMAL LOW

## 2014-04-02 LAB — CREATININE, URINE, RANDOM: Creatinine, Urine Random: 115.9 mg/dL (ref 30.0–125.0)

## 2014-04-09 DIAGNOSIS — R609 Edema, unspecified: Secondary | ICD-10-CM | POA: Insufficient documentation

## 2014-04-23 LAB — HEPATIC FUNCTION PANEL A (ARMC)
ALT: 26 U/L (ref 12–78)
Albumin: 3.6 g/dL (ref 3.4–5.0)
Alkaline Phosphatase: 49 U/L
BILIRUBIN DIRECT: 0.1 mg/dL (ref 0.00–0.20)
BILIRUBIN TOTAL: 0.6 mg/dL (ref 0.2–1.0)
SGOT(AST): 19 U/L (ref 15–37)
Total Protein: 7.2 g/dL (ref 6.4–8.2)

## 2014-04-23 LAB — PROTEIN, URINE, RANDOM: Protein, Random Urine: 8 mg/dL (ref 0–12)

## 2014-04-23 LAB — CBC CANCER CENTER
BASOS ABS: 0.1 x10 3/mm (ref 0.0–0.1)
Basophil %: 1.4 %
EOS ABS: 0.3 x10 3/mm (ref 0.0–0.7)
Eosinophil %: 6.5 %
HCT: 41 % (ref 40.0–52.0)
HGB: 14.2 g/dL (ref 13.0–18.0)
LYMPHS PCT: 26.8 %
Lymphocyte #: 1.3 x10 3/mm (ref 1.0–3.6)
MCH: 31.6 pg (ref 26.0–34.0)
MCHC: 34.6 g/dL (ref 32.0–36.0)
MCV: 91 fL (ref 80–100)
Monocyte #: 0.5 x10 3/mm (ref 0.2–1.0)
Monocyte %: 10.1 %
NEUTROS ABS: 2.8 x10 3/mm (ref 1.4–6.5)
Neutrophil %: 55.2 %
Platelet: 136 x10 3/mm — ABNORMAL LOW (ref 150–440)
RBC: 4.49 10*6/uL (ref 4.40–5.90)
RDW: 13.8 % (ref 11.5–14.5)
WBC: 5 x10 3/mm (ref 3.8–10.6)

## 2014-04-23 LAB — BASIC METABOLIC PANEL
Anion Gap: 8 (ref 7–16)
BUN: 23 mg/dL — ABNORMAL HIGH (ref 7–18)
CALCIUM: 8.7 mg/dL (ref 8.5–10.1)
CREATININE: 1.17 mg/dL (ref 0.60–1.30)
Chloride: 100 mmol/L (ref 98–107)
Co2: 31 mmol/L (ref 21–32)
EGFR (African American): >60
Glucose: 120 mg/dL — ABNORMAL HIGH (ref 65–99)
Osmolality: 282 (ref 275–301)
Potassium: 4.4 mmol/L (ref 3.5–5.1)
Sodium: 139 mmol/L (ref 136–145)

## 2014-04-23 LAB — CREATININE, URINE, RANDOM: Creatinine, Urine Random: 54.4 mg/dL (ref 30.0–125.0)

## 2014-04-23 LAB — MAGNESIUM: MAGNESIUM: 1.8 mg/dL

## 2014-05-03 ENCOUNTER — Ambulatory Visit: Payer: Self-pay | Admitting: Internal Medicine

## 2014-05-03 ENCOUNTER — Ambulatory Visit: Payer: Self-pay | Admitting: Cardiothoracic Surgery

## 2014-05-14 LAB — BASIC METABOLIC PANEL
ANION GAP: 8 (ref 7–16)
BUN: 22 mg/dL — AB (ref 7–18)
Calcium, Total: 9 mg/dL (ref 8.5–10.1)
Chloride: 99 mmol/L (ref 98–107)
Co2: 30 mmol/L (ref 21–32)
Creatinine: 1.15 mg/dL (ref 0.60–1.30)
GLUCOSE: 107 mg/dL — AB (ref 65–99)
Osmolality: 278 (ref 275–301)
Potassium: 4 mmol/L (ref 3.5–5.1)
Sodium: 137 mmol/L (ref 136–145)

## 2014-05-14 LAB — CBC CANCER CENTER
BASOS ABS: 0 x10 3/mm (ref 0.0–0.1)
BASOS PCT: 0.6 %
EOS PCT: 3 %
Eosinophil #: 0.2 x10 3/mm (ref 0.0–0.7)
HCT: 39.5 % — ABNORMAL LOW (ref 40.0–52.0)
HGB: 13.4 g/dL (ref 13.0–18.0)
LYMPHS PCT: 27.8 %
Lymphocyte #: 1.5 x10 3/mm (ref 1.0–3.6)
MCH: 31.7 pg (ref 26.0–34.0)
MCHC: 33.9 g/dL (ref 32.0–36.0)
MCV: 94 fL (ref 80–100)
Monocyte #: 0.6 x10 3/mm (ref 0.2–1.0)
Monocyte %: 10.4 %
Neutrophil #: 3.2 x10 3/mm (ref 1.4–6.5)
Neutrophil %: 58.2 %
Platelet: 150 x10 3/mm (ref 150–440)
RBC: 4.22 10*6/uL — AB (ref 4.40–5.90)
RDW: 14 % (ref 11.5–14.5)
WBC: 5.4 x10 3/mm (ref 3.8–10.6)

## 2014-05-14 LAB — HEPATIC FUNCTION PANEL A (ARMC)
AST: 16 U/L (ref 15–37)
Albumin: 3.5 g/dL (ref 3.4–5.0)
Alkaline Phosphatase: 48 U/L
Bilirubin, Direct: 0.1 mg/dL (ref 0.00–0.20)
Bilirubin,Total: 0.4 mg/dL (ref 0.2–1.0)
SGPT (ALT): 24 U/L (ref 12–78)
TOTAL PROTEIN: 6.9 g/dL (ref 6.4–8.2)

## 2014-05-14 LAB — HEMOGLOBIN A1C: Hemoglobin A1C: 6.4 % — ABNORMAL HIGH (ref 4.2–6.3)

## 2014-05-14 LAB — PROTEIN, URINE, RANDOM: Protein, Random Urine: 5 mg/dL (ref 0–12)

## 2014-05-14 LAB — CREATININE, URINE, RANDOM: CREATININE, URINE RANDOM: 74.2 mg/dL (ref 30.0–125.0)

## 2014-05-14 LAB — MAGNESIUM: Magnesium: 1.8 mg/dL

## 2014-06-02 ENCOUNTER — Ambulatory Visit: Payer: Self-pay | Admitting: Cardiothoracic Surgery

## 2014-06-02 ENCOUNTER — Ambulatory Visit: Payer: Self-pay | Admitting: Internal Medicine

## 2014-06-03 LAB — CBC CANCER CENTER
BASOS PCT: 0.7 %
Basophil #: 0 x10 3/mm (ref 0.0–0.1)
EOS ABS: 0.4 x10 3/mm (ref 0.0–0.7)
EOS PCT: 6.2 %
HCT: 39.4 % — ABNORMAL LOW (ref 40.0–52.0)
HGB: 13.4 g/dL (ref 13.0–18.0)
LYMPHS ABS: 1.6 x10 3/mm (ref 1.0–3.6)
Lymphocyte %: 27.7 %
MCH: 31.7 pg (ref 26.0–34.0)
MCHC: 33.9 g/dL (ref 32.0–36.0)
MCV: 93 fL (ref 80–100)
Monocyte #: 0.6 x10 3/mm (ref 0.2–1.0)
Monocyte %: 11.3 %
NEUTROS ABS: 3.1 x10 3/mm (ref 1.4–6.5)
Neutrophil %: 54.1 %
Platelet: 138 x10 3/mm — ABNORMAL LOW (ref 150–440)
RBC: 4.22 10*6/uL — ABNORMAL LOW (ref 4.40–5.90)
RDW: 13.5 % (ref 11.5–14.5)
WBC: 5.7 x10 3/mm (ref 3.8–10.6)

## 2014-06-03 LAB — BASIC METABOLIC PANEL
ANION GAP: 8 (ref 7–16)
BUN: 24 mg/dL — ABNORMAL HIGH (ref 7–18)
CALCIUM: 9.3 mg/dL (ref 8.5–10.1)
CHLORIDE: 99 mmol/L (ref 98–107)
Co2: 30 mmol/L (ref 21–32)
Creatinine: 1.16 mg/dL (ref 0.60–1.30)
EGFR (African American): >60
EGFR (Non-African Amer.): >60
GLUCOSE: 115 mg/dL — AB (ref 65–99)
OSMOLALITY: 279 (ref 275–301)
POTASSIUM: 4 mmol/L (ref 3.5–5.1)
SODIUM: 137 mmol/L (ref 136–145)

## 2014-06-03 LAB — HEPATIC FUNCTION PANEL A (ARMC)
ALT: 25 U/L (ref 12–78)
Albumin: 3.5 g/dL (ref 3.4–5.0)
Alkaline Phosphatase: 48 U/L
Bilirubin, Direct: <0.05 mg/dL (ref 0.00–0.20)
Bilirubin,Total: 0.5 mg/dL (ref 0.2–1.0)
SGOT(AST): 19 U/L (ref 15–37)
TOTAL PROTEIN: 7 g/dL (ref 6.4–8.2)

## 2014-06-03 LAB — MAGNESIUM: Magnesium: 1.6 mg/dL — ABNORMAL LOW

## 2014-06-24 LAB — CBC CANCER CENTER
Basophil #: 0 x10 3/mm (ref 0.0–0.1)
Basophil %: 0.5 %
Eosinophil #: 0.2 x10 3/mm (ref 0.0–0.7)
Eosinophil %: 3 %
HCT: 39.7 % — AB (ref 40.0–52.0)
HGB: 13.2 g/dL (ref 13.0–18.0)
Lymphocyte #: 1.7 x10 3/mm (ref 1.0–3.6)
Lymphocyte %: 28.5 %
MCH: 31.9 pg (ref 26.0–34.0)
MCHC: 33.4 g/dL (ref 32.0–36.0)
MCV: 96 fL (ref 80–100)
Monocyte #: 0.8 x10 3/mm (ref 0.2–1.0)
Monocyte %: 13.4 %
NEUTROS ABS: 3.2 x10 3/mm (ref 1.4–6.5)
Neutrophil %: 54.6 %
Platelet: 147 x10 3/mm — ABNORMAL LOW (ref 150–440)
RBC: 4.15 10*6/uL — AB (ref 4.40–5.90)
RDW: 13.7 % (ref 11.5–14.5)
WBC: 5.8 x10 3/mm (ref 3.8–10.6)

## 2014-07-03 ENCOUNTER — Ambulatory Visit: Payer: Self-pay | Admitting: Internal Medicine

## 2014-07-16 LAB — CBC CANCER CENTER
BASOS ABS: 0 x10 3/mm (ref 0.0–0.1)
Basophil %: 0.7 %
EOS PCT: 1.7 %
Eosinophil #: 0.1 x10 3/mm (ref 0.0–0.7)
HCT: 40.6 % (ref 40.0–52.0)
HGB: 13.5 g/dL (ref 13.0–18.0)
Lymphocyte #: 1.6 x10 3/mm (ref 1.0–3.6)
Lymphocyte %: 23.6 %
MCH: 31.9 pg (ref 26.0–34.0)
MCHC: 33.3 g/dL (ref 32.0–36.0)
MCV: 96 fL (ref 80–100)
Monocyte #: 0.6 x10 3/mm (ref 0.2–1.0)
Monocyte %: 9.1 %
NEUTROS ABS: 4.5 x10 3/mm (ref 1.4–6.5)
NEUTROS PCT: 64.9 %
PLATELETS: 140 x10 3/mm — AB (ref 150–440)
RBC: 4.24 10*6/uL — AB (ref 4.40–5.90)
RDW: 13.6 % (ref 11.5–14.5)
WBC: 6.9 x10 3/mm (ref 3.8–10.6)

## 2014-07-16 LAB — CREATININE, SERUM
Creatinine: 1.32 mg/dL — ABNORMAL HIGH (ref 0.60–1.30)
EGFR (Non-African Amer.): >60

## 2014-08-03 ENCOUNTER — Ambulatory Visit: Payer: Self-pay | Admitting: Cardiothoracic Surgery

## 2014-08-03 ENCOUNTER — Ambulatory Visit: Payer: Self-pay | Admitting: Internal Medicine

## 2014-08-06 LAB — HEPATIC FUNCTION PANEL A (ARMC)
ALK PHOS: 57 U/L
ALT: 29 U/L
Albumin: 3.4 g/dL (ref 3.4–5.0)
BILIRUBIN TOTAL: 0.4 mg/dL (ref 0.2–1.0)
Bilirubin, Direct: <0.1 mg/dL (ref 0.00–0.20)
SGOT(AST): 24 U/L (ref 15–37)
Total Protein: 6.9 g/dL (ref 6.4–8.2)

## 2014-08-06 LAB — CBC CANCER CENTER
BASOS PCT: 1.2 %
Basophil #: 0.1 x10 3/mm (ref 0.0–0.1)
EOS ABS: 0.1 x10 3/mm (ref 0.0–0.7)
Eosinophil %: 2.8 %
HCT: 41.1 % (ref 40.0–52.0)
HGB: 13.9 g/dL (ref 13.0–18.0)
LYMPHS PCT: 31.8 %
Lymphocyte #: 1.7 x10 3/mm (ref 1.0–3.6)
MCH: 32.3 pg (ref 26.0–34.0)
MCHC: 33.8 g/dL (ref 32.0–36.0)
MCV: 96 fL (ref 80–100)
MONO ABS: 0.5 x10 3/mm (ref 0.2–1.0)
MONOS PCT: 8.9 %
NEUTROS ABS: 2.9 x10 3/mm (ref 1.4–6.5)
Neutrophil %: 55.3 %
PLATELETS: 147 x10 3/mm — AB (ref 150–440)
RBC: 4.3 10*6/uL — ABNORMAL LOW (ref 4.40–5.90)
RDW: 13.4 % (ref 11.5–14.5)
WBC: 5.2 x10 3/mm (ref 3.8–10.6)

## 2014-08-06 LAB — PROTEIN, URINE, RANDOM: Protein, Random Urine: 8 mg/dL (ref 0–12)

## 2014-08-06 LAB — CREATININE, URINE, RANDOM: Creatinine, Urine Random: 34.7 mg/dL (ref 30.0–125.0)

## 2014-08-06 LAB — CREATININE, SERUM
Creatinine: 1.21 mg/dL (ref 0.60–1.30)
EGFR (Non-African Amer.): >60

## 2014-08-06 LAB — MAGNESIUM: MAGNESIUM: 1.8 mg/dL

## 2014-08-27 LAB — CBC CANCER CENTER
Basophil #: 0 x10 3/mm (ref 0.0–0.1)
Basophil %: 0.5 %
EOS PCT: 2.7 %
Eosinophil #: 0.2 x10 3/mm (ref 0.0–0.7)
HCT: 41.3 % (ref 40.0–52.0)
HGB: 13.8 g/dL (ref 13.0–18.0)
Lymphocyte #: 1.9 x10 3/mm (ref 1.0–3.6)
Lymphocyte %: 32.4 %
MCH: 31.6 pg (ref 26.0–34.0)
MCHC: 33.4 g/dL (ref 32.0–36.0)
MCV: 95 fL (ref 80–100)
MONO ABS: 0.6 x10 3/mm (ref 0.2–1.0)
Monocyte %: 9.5 %
NEUTROS ABS: 3.2 x10 3/mm (ref 1.4–6.5)
Neutrophil %: 54.9 %
Platelet: 143 x10 3/mm — ABNORMAL LOW (ref 150–440)
RBC: 4.37 10*6/uL — ABNORMAL LOW (ref 4.40–5.90)
RDW: 13.2 % (ref 11.5–14.5)
WBC: 5.9 x10 3/mm (ref 3.8–10.6)

## 2014-08-27 LAB — CREATININE, SERUM: CREATININE: 1.11 mg/dL (ref 0.60–1.30)

## 2014-09-02 ENCOUNTER — Ambulatory Visit: Payer: Self-pay | Admitting: Internal Medicine

## 2014-09-02 ENCOUNTER — Ambulatory Visit: Payer: Self-pay | Admitting: Cardiothoracic Surgery

## 2014-09-17 LAB — CBC CANCER CENTER
BASOS PCT: 0.8 %
Basophil #: 0 x10 3/mm (ref 0.0–0.1)
EOS ABS: 0.3 x10 3/mm (ref 0.0–0.7)
EOS PCT: 4.9 %
HCT: 41.4 % (ref 40.0–52.0)
HGB: 13.9 g/dL (ref 13.0–18.0)
LYMPHS ABS: 1.8 x10 3/mm (ref 1.0–3.6)
LYMPHS PCT: 32.7 %
MCH: 31.8 pg (ref 26.0–34.0)
MCHC: 33.6 g/dL (ref 32.0–36.0)
MCV: 95 fL (ref 80–100)
Monocyte #: 0.6 x10 3/mm (ref 0.2–1.0)
Monocyte %: 10.9 %
NEUTROS PCT: 50.7 %
Neutrophil #: 2.8 x10 3/mm (ref 1.4–6.5)
PLATELETS: 147 x10 3/mm — AB (ref 150–440)
RBC: 4.37 10*6/uL — ABNORMAL LOW (ref 4.40–5.90)
RDW: 13 % (ref 11.5–14.5)
WBC: 5.5 x10 3/mm (ref 3.8–10.6)

## 2014-09-17 LAB — CREATININE, SERUM
Creatinine: 1.32 mg/dL — ABNORMAL HIGH (ref 0.60–1.30)
EGFR (African American): >60
EGFR (Non-African Amer.): >60

## 2014-10-03 ENCOUNTER — Ambulatory Visit: Payer: Self-pay | Admitting: Internal Medicine

## 2014-10-11 LAB — CBC CANCER CENTER
BASOS ABS: 0 x10 3/mm (ref 0.0–0.1)
BASOS PCT: 0.8 %
Eosinophil #: 0.2 x10 3/mm (ref 0.0–0.7)
Eosinophil %: 2.7 %
HCT: 40.7 % (ref 40.0–52.0)
HGB: 13.8 g/dL (ref 13.0–18.0)
LYMPHS ABS: 1.8 x10 3/mm (ref 1.0–3.6)
LYMPHS PCT: 32.1 %
MCH: 32.1 pg (ref 26.0–34.0)
MCHC: 33.9 g/dL (ref 32.0–36.0)
MCV: 95 fL (ref 80–100)
MONOS PCT: 10.2 %
Monocyte #: 0.6 x10 3/mm (ref 0.2–1.0)
NEUTROS ABS: 3.1 x10 3/mm (ref 1.4–6.5)
NEUTROS PCT: 54.2 %
Platelet: 149 x10 3/mm — ABNORMAL LOW (ref 150–440)
RBC: 4.29 10*6/uL — AB (ref 4.40–5.90)
RDW: 13.1 % (ref 11.5–14.5)
WBC: 5.7 x10 3/mm (ref 3.8–10.6)

## 2014-10-11 LAB — COMPREHENSIVE METABOLIC PANEL
Albumin: 3.6 g/dL (ref 3.4–5.0)
Alkaline Phosphatase: 55 U/L
Anion Gap: 7 (ref 7–16)
BILIRUBIN TOTAL: 0.5 mg/dL (ref 0.2–1.0)
BUN: 18 mg/dL (ref 7–18)
CALCIUM: 9.3 mg/dL (ref 8.5–10.1)
Chloride: 101 mmol/L (ref 98–107)
Co2: 31 mmol/L (ref 21–32)
Creatinine: 1.31 mg/dL — ABNORMAL HIGH (ref 0.60–1.30)
EGFR (African American): >60
Glucose: 119 mg/dL — ABNORMAL HIGH (ref 65–99)
OSMOLALITY: 281 (ref 275–301)
Potassium: 3.8 mmol/L (ref 3.5–5.1)
SGOT(AST): 17 U/L (ref 15–37)
SGPT (ALT): 28 U/L
SODIUM: 139 mmol/L (ref 136–145)
TOTAL PROTEIN: 6.7 g/dL (ref 6.4–8.2)

## 2014-10-11 LAB — CREATININE, URINE, RANDOM: CREATININE, URINE RANDOM: 82.8 mg/dL (ref 30.0–125.0)

## 2014-10-11 LAB — MAGNESIUM: Magnesium: 2 mg/dL

## 2014-10-11 LAB — PROTEIN, URINE, RANDOM: Protein, Random Urine: 6 mg/dL (ref 0–12)

## 2014-11-01 LAB — CBC CANCER CENTER
Basophil #: 0 x10 3/mm (ref 0.0–0.1)
Basophil %: 0.6 %
Eosinophil #: 0.1 x10 3/mm (ref 0.0–0.7)
Eosinophil %: 1.3 %
HCT: 43.1 % (ref 40.0–52.0)
HGB: 14.5 g/dL (ref 13.0–18.0)
Lymphocyte #: 1.4 x10 3/mm (ref 1.0–3.6)
Lymphocyte %: 22.5 %
MCH: 31.8 pg (ref 26.0–34.0)
MCHC: 33.6 g/dL (ref 32.0–36.0)
MCV: 95 fL (ref 80–100)
MONOS PCT: 7.4 %
Monocyte #: 0.5 x10 3/mm (ref 0.2–1.0)
Neutrophil #: 4.3 x10 3/mm (ref 1.4–6.5)
Neutrophil %: 68.2 %
Platelet: 161 x10 3/mm (ref 150–440)
RBC: 4.56 10*6/uL (ref 4.40–5.90)
RDW: 13.3 % (ref 11.5–14.5)
WBC: 6.3 x10 3/mm (ref 3.8–10.6)

## 2014-11-01 LAB — CREATININE, SERUM
CREATININE: 1.07 mg/dL (ref 0.60–1.30)
EGFR (African American): >60
EGFR (Non-African Amer.): >60

## 2014-11-02 ENCOUNTER — Ambulatory Visit: Payer: Self-pay | Admitting: Internal Medicine

## 2014-11-19 LAB — HEPATIC FUNCTION PANEL A (ARMC)
ALK PHOS: 58 U/L
ALT: 26 U/L
AST: 18 U/L (ref 15–37)
Albumin: 3.5 g/dL (ref 3.4–5.0)
BILIRUBIN DIRECT: 0.1 mg/dL (ref 0.0–0.2)
Bilirubin,Total: 0.4 mg/dL (ref 0.2–1.0)
Total Protein: 6.7 g/dL (ref 6.4–8.2)

## 2014-11-19 LAB — BASIC METABOLIC PANEL
Anion Gap: 8 (ref 7–16)
BUN: 18 mg/dL (ref 7–18)
CALCIUM: 8.4 mg/dL — AB (ref 8.5–10.1)
CO2: 29 mmol/L (ref 21–32)
CREATININE: 1.4 mg/dL — AB (ref 0.60–1.30)
Chloride: 100 mmol/L (ref 98–107)
EGFR (African American): >60
EGFR (Non-African Amer.): 57 — ABNORMAL LOW
Glucose: 174 mg/dL — ABNORMAL HIGH (ref 65–99)
Osmolality: 280 (ref 275–301)
POTASSIUM: 3.7 mmol/L (ref 3.5–5.1)
Sodium: 137 mmol/L (ref 136–145)

## 2014-11-19 LAB — CBC CANCER CENTER
BASOS ABS: 0 x10 3/mm (ref 0.0–0.1)
BASOS PCT: 0.8 %
EOS PCT: 3.1 %
Eosinophil #: 0.2 x10 3/mm (ref 0.0–0.7)
HCT: 41.3 % (ref 40.0–52.0)
HGB: 13.9 g/dL (ref 13.0–18.0)
Lymphocyte #: 1.3 x10 3/mm (ref 1.0–3.6)
Lymphocyte %: 26.3 %
MCH: 31.7 pg (ref 26.0–34.0)
MCHC: 33.8 g/dL (ref 32.0–36.0)
MCV: 94 fL (ref 80–100)
MONO ABS: 0.6 x10 3/mm (ref 0.2–1.0)
Monocyte %: 11.1 %
NEUTROS PCT: 58.7 %
Neutrophil #: 3 x10 3/mm (ref 1.4–6.5)
Platelet: 127 x10 3/mm — ABNORMAL LOW (ref 150–440)
RBC: 4.39 10*6/uL — ABNORMAL LOW (ref 4.40–5.90)
RDW: 13.4 % (ref 11.5–14.5)
WBC: 5.1 x10 3/mm (ref 3.8–10.6)

## 2014-12-03 ENCOUNTER — Ambulatory Visit: Payer: Self-pay | Admitting: Internal Medicine

## 2014-12-10 LAB — CBC CANCER CENTER
BASOS ABS: 0 x10 3/mm (ref 0.0–0.1)
Basophil %: 0.6 %
Eosinophil #: 0.2 x10 3/mm (ref 0.0–0.7)
Eosinophil %: 3.1 %
HCT: 40.6 % (ref 40.0–52.0)
HGB: 13.6 g/dL (ref 13.0–18.0)
LYMPHS ABS: 1.5 x10 3/mm (ref 1.0–3.6)
Lymphocyte %: 26.9 %
MCH: 31.5 pg (ref 26.0–34.0)
MCHC: 33.6 g/dL (ref 32.0–36.0)
MCV: 94 fL (ref 80–100)
MONO ABS: 0.5 x10 3/mm (ref 0.2–1.0)
Monocyte %: 9.9 %
NEUTROS ABS: 3.3 x10 3/mm (ref 1.4–6.5)
Neutrophil %: 59.5 %
PLATELETS: 151 x10 3/mm (ref 150–440)
RBC: 4.32 10*6/uL — ABNORMAL LOW (ref 4.40–5.90)
RDW: 13.7 % (ref 11.5–14.5)
WBC: 5.6 x10 3/mm (ref 3.8–10.6)

## 2014-12-10 LAB — COMPREHENSIVE METABOLIC PANEL
ALT: 23 U/L
ANION GAP: 6 — AB (ref 7–16)
AST: 17 U/L (ref 15–37)
Albumin: 3.4 g/dL (ref 3.4–5.0)
Alkaline Phosphatase: 50 U/L
BUN: 21 mg/dL — ABNORMAL HIGH (ref 7–18)
Bilirubin,Total: 0.5 mg/dL (ref 0.2–1.0)
CHLORIDE: 102 mmol/L (ref 98–107)
CO2: 30 mmol/L (ref 21–32)
Calcium, Total: 8.4 mg/dL — ABNORMAL LOW (ref 8.5–10.1)
Creatinine: 1.1 mg/dL (ref 0.60–1.30)
Glucose: 160 mg/dL — ABNORMAL HIGH (ref 65–99)
Osmolality: 282 (ref 275–301)
POTASSIUM: 3.6 mmol/L (ref 3.5–5.1)
Sodium: 138 mmol/L (ref 136–145)
TOTAL PROTEIN: 6.6 g/dL (ref 6.4–8.2)

## 2014-12-10 LAB — PROTEIN, URINE, RANDOM: Protein, Random Urine: <5 mg/dL — ABNORMAL LOW (ref 0–12)

## 2014-12-10 LAB — CREATININE, URINE, RANDOM: CREATININE, URINE RANDOM: 70.7 mg/dL (ref 30.0–125.0)

## 2014-12-10 LAB — MAGNESIUM: Magnesium: 1.8 mg/dL

## 2014-12-31 LAB — CBC CANCER CENTER
BASOS ABS: 0.1 x10 3/mm (ref 0.0–0.1)
Basophil %: 1.1 %
Eosinophil #: 0.1 x10 3/mm (ref 0.0–0.7)
Eosinophil %: 2.3 %
HCT: 40.6 % (ref 40.0–52.0)
HGB: 13.8 g/dL (ref 13.0–18.0)
Lymphocyte #: 1.8 x10 3/mm (ref 1.0–3.6)
Lymphocyte %: 30.2 %
MCH: 31.7 pg (ref 26.0–34.0)
MCHC: 34 g/dL (ref 32.0–36.0)
MCV: 93 fL (ref 80–100)
Monocyte #: 0.6 x10 3/mm (ref 0.2–1.0)
Monocyte %: 10.1 %
NEUTROS ABS: 3.4 x10 3/mm (ref 1.4–6.5)
Neutrophil %: 56.3 %
PLATELETS: 141 x10 3/mm — AB (ref 150–440)
RBC: 4.35 10*6/uL — ABNORMAL LOW (ref 4.40–5.90)
RDW: 13.7 % (ref 11.5–14.5)
WBC: 6.1 x10 3/mm (ref 3.8–10.6)

## 2014-12-31 LAB — CREATININE, SERUM
Creatinine: 1.11 mg/dL (ref 0.60–1.30)
EGFR (African American): >60
EGFR (Non-African Amer.): >60

## 2015-01-03 ENCOUNTER — Ambulatory Visit: Payer: Self-pay | Admitting: Internal Medicine

## 2015-01-03 ENCOUNTER — Ambulatory Visit: Payer: Self-pay | Admitting: Cardiothoracic Surgery

## 2015-02-01 ENCOUNTER — Ambulatory Visit
Admit: 2015-02-01 | Disposition: A | Discharge status: Home / Self Care | Payer: Self-pay | Attending: Internal Medicine | Admitting: Internal Medicine

## 2015-03-04 ENCOUNTER — Ambulatory Visit
Admit: 2015-03-04 | Disposition: A | Discharge status: Home / Self Care | Payer: Self-pay | Attending: Internal Medicine | Admitting: Internal Medicine

## 2015-03-04 LAB — CBC CANCER CENTER
Basophil #: 0 x10 3/mm (ref 0.0–0.1)
Basophil %: 0.7 %
Eosinophil #: 0.4 x10 3/mm (ref 0.0–0.7)
Eosinophil %: 8.3 %
HCT: 40.4 % (ref 40.0–52.0)
HGB: 13.8 g/dL (ref 13.0–18.0)
Lymphocyte #: 1.3 x10 3/mm (ref 1.0–3.6)
Lymphocyte %: 26.3 %
MCH: 31.9 pg (ref 26.0–34.0)
MCHC: 34.1 g/dL (ref 32.0–36.0)
MCV: 93 fL (ref 80–100)
Monocyte #: 0.5 x10 3/mm (ref 0.2–1.0)
Monocyte %: 9.4 %
Neutrophil #: 2.8 x10 3/mm (ref 1.4–6.5)
Neutrophil %: 55.3 %
PLATELETS: 145 x10 3/mm — AB (ref 150–440)
RBC: 4.33 10*6/uL — ABNORMAL LOW (ref 4.40–5.90)
RDW: 13.2 % (ref 11.5–14.5)
WBC: 5 x10 3/mm (ref 3.8–10.6)

## 2015-03-04 LAB — CREATININE, SERUM
CREATININE: 1.04 mg/dL
Creatinine, Ser: 1.04
EGFR (African American): >60

## 2015-03-08 ENCOUNTER — Encounter: Payer: Self-pay | Admitting: *Deleted

## 2015-03-25 LAB — CBC CANCER CENTER
BASOS PCT: 0.6 %
Basophil #: 0 x10 3/mm (ref 0.0–0.1)
Eosinophil #: 0.1 x10 3/mm (ref 0.0–0.7)
Eosinophil %: 2.7 %
HCT: 41.1 % (ref 40.0–52.0)
HGB: 13.6 g/dL (ref 13.0–18.0)
LYMPHS PCT: 30.3 %
Lymphocyte #: 1.7 x10 3/mm (ref 1.0–3.6)
MCH: 30.6 pg (ref 26.0–34.0)
MCHC: 33 g/dL (ref 32.0–36.0)
MCV: 93 fL (ref 80–100)
Monocyte #: 0.4 x10 3/mm (ref 0.2–1.0)
Monocyte %: 7.7 %
NEUTROS ABS: 3.2 x10 3/mm (ref 1.4–6.5)
Neutrophil %: 58.7 %
Platelet: 151 x10 3/mm (ref 150–440)
RBC: 4.43 10*6/uL (ref 4.40–5.90)
RDW: 13.5 % (ref 11.5–14.5)
WBC: 5.5 x10 3/mm (ref 3.8–10.6)

## 2015-03-25 LAB — CREATININE, SERUM
Creatinine: 1.1 mg/dL
EGFR (African American): >60

## 2015-04-05 ENCOUNTER — Other Ambulatory Visit: Payer: Self-pay | Admitting: *Deleted

## 2015-04-05 DIAGNOSIS — C3490 Malignant neoplasm of unspecified part of unspecified bronchus or lung: Secondary | ICD-10-CM

## 2015-04-15 ENCOUNTER — Inpatient Hospital Stay: Payer: BLUE CROSS/BLUE SHIELD | Attending: Internal Medicine

## 2015-04-15 ENCOUNTER — Inpatient Hospital Stay (HOSPITAL_BASED_OUTPATIENT_CLINIC_OR_DEPARTMENT_OTHER): Payer: BLUE CROSS/BLUE SHIELD | Admitting: Internal Medicine

## 2015-04-15 ENCOUNTER — Encounter: Payer: Self-pay | Admitting: Internal Medicine

## 2015-04-15 ENCOUNTER — Inpatient Hospital Stay: Payer: BLUE CROSS/BLUE SHIELD

## 2015-04-15 VITALS — BP 122/77 | HR 70 | Temp 96.1°F | Ht 70.0 in | Wt 200.2 lb

## 2015-04-15 DIAGNOSIS — Z9221 Personal history of antineoplastic chemotherapy: Secondary | ICD-10-CM

## 2015-04-15 DIAGNOSIS — Z79899 Other long term (current) drug therapy: Secondary | ICD-10-CM

## 2015-04-15 DIAGNOSIS — R739 Hyperglycemia, unspecified: Secondary | ICD-10-CM

## 2015-04-15 DIAGNOSIS — C3490 Malignant neoplasm of unspecified part of unspecified bronchus or lung: Secondary | ICD-10-CM

## 2015-04-15 DIAGNOSIS — C3492 Malignant neoplasm of unspecified part of left bronchus or lung: Secondary | ICD-10-CM | POA: Insufficient documentation

## 2015-04-15 DIAGNOSIS — D696 Thrombocytopenia, unspecified: Secondary | ICD-10-CM

## 2015-04-15 DIAGNOSIS — C3412 Malignant neoplasm of upper lobe, left bronchus or lung: Secondary | ICD-10-CM

## 2015-04-15 DIAGNOSIS — R600 Localized edema: Secondary | ICD-10-CM

## 2015-04-15 LAB — MAGNESIUM: MAGNESIUM: 2 mg/dL (ref 1.7–2.4)

## 2015-04-15 LAB — CBC WITH DIFFERENTIAL/PLATELET
BASOS ABS: 0 10*3/uL (ref 0–0.1)
Basophils Relative: 1 %
EOS PCT: 2 %
Eosinophils Absolute: 0.1 10*3/uL (ref 0–0.7)
HEMATOCRIT: 39 % — AB (ref 40.0–52.0)
Hemoglobin: 13.1 g/dL (ref 13.0–18.0)
LYMPHS ABS: 1.8 10*3/uL (ref 1.0–3.6)
LYMPHS PCT: 29 %
MCH: 30.6 pg (ref 26.0–34.0)
MCHC: 33.5 g/dL (ref 32.0–36.0)
MCV: 91.3 fL (ref 80.0–100.0)
MONO ABS: 0.4 10*3/uL (ref 0.2–1.0)
Monocytes Relative: 6 %
Neutro Abs: 3.9 10*3/uL (ref 1.4–6.5)
Neutrophils Relative %: 62 %
Platelets: 191 10*3/uL (ref 150–440)
RBC: 4.28 MIL/uL — ABNORMAL LOW (ref 4.40–5.90)
RDW: 13.3 % (ref 11.5–14.5)
WBC: 6.2 10*3/uL (ref 3.8–10.6)

## 2015-04-15 LAB — PROTEIN / CREATININE RATIO, URINE: Creatinine, Urine: 61 mg/dL

## 2015-04-15 LAB — CREATININE, SERUM
CREATININE: 1.21 mg/dL (ref 0.61–1.24)
GFR calc Af Amer: >60 mL/min (ref >60)

## 2015-04-15 LAB — HEPATIC FUNCTION PANEL
ALT: 21 U/L (ref 17–63)
AST: 27 U/L (ref 15–41)
Albumin: 3.7 g/dL (ref 3.5–5.0)
Alkaline Phosphatase: 45 U/L (ref 38–126)
Bilirubin, Direct: <0.1 mg/dL — ABNORMAL LOW (ref 0.1–0.5)
Total Bilirubin: 0.7 mg/dL (ref 0.3–1.2)
Total Protein: 6.9 g/dL (ref 6.5–8.1)

## 2015-04-15 MED ORDER — INV-PEMETREXED CHEMO INJECTION 500 MG LILLY S130
1050.0000 mg | Freq: Once | INTRAVENOUS | Status: AC
  Administered 2015-04-15: 1050 mg via INTRAVENOUS
  Filled 2015-04-15: qty 42

## 2015-04-15 MED ORDER — SODIUM CHLORIDE 0.9 % IV SOLN
Freq: Once | INTRAVENOUS | Status: AC
  Administered 2015-04-15: 16:00:00 via INTRAVENOUS
  Filled 2015-04-15: qty 4

## 2015-04-15 MED ORDER — HEPARIN SOD (PORK) LOCK FLUSH 100 UNIT/ML IV SOLN
500.0000 [IU] | Freq: Once | INTRAVENOUS | Status: DC | PRN
  Filled 2015-04-15: qty 5

## 2015-04-15 MED ORDER — SODIUM CHLORIDE 0.9 % IV SOLN
Freq: Once | INTRAVENOUS | Status: AC
  Administered 2015-04-15: 15:00:00 via INTRAVENOUS
  Filled 2015-04-15: qty 1000

## 2015-04-15 NOTE — Progress Notes (Signed)
Cycle 68/Day 1: Pt. arrives in clinic today for maintenance Pemetrexed per the Lilly S-130 protocol.  The patient reports that he had a cough productive of purulent sputum (Grade 2-requiring treatment)and was placed on a Z pak.  He started the medication on April 27- Apr 03, 2015 with resolution of the infection. He continues to have mild edema of ankles (grade 1).  He had an MRI of his left knee done several week ago at [place] (Dr. Verda Cumins).  The result indicates possible meniscus tear (grade 1)and possible arthritis ( grade 1).  He remains active despite the injury with an ECOG performance status of 0. His condition and labs were reviewed with Dr. Ma Hillock and he was cleared for today's treatment. He will return to clinic in 3 weeks for CBC, serum creatinine, see MD, and maintenance Pemetrexed.

## 2015-04-20 ENCOUNTER — Telehealth: Payer: Self-pay | Admitting: *Deleted

## 2015-04-20 NOTE — Telephone Encounter (Signed)
Patient called me to request his lab results from 04/15/15 and to report an episode of light-headedness in the shower about 2-3 days ago.  Patient's Hgb is 13.1(13.0 =LLN) so cautioned patient to keep shower water cooler so as not to drop his BP while in the shower.  He is on B12 orally, B12 injections Q 9 weeks, and folate orally for his Pemetrexed maintenance treatment. His next B12 injection is due May 27, 2015). Pt. instructed to call back if episodes worsen. Dr.Pandit notified and his recommendation is to continue to monitor episodes.  If it continues then he may consider imaging to r/o brain mets.

## 2015-04-28 NOTE — Progress Notes (Signed)
[place]  Telephone:(336) [phone] Fax:(336) [phone]     ID: Mitchell Mosley OB: 1961-03-11  MR#: [id number]  QJJ#:[id number]  Patient Care Team: [name], MD as PCP - General (Family Medicine)  CHIEF COMPLAINT/DIAGNOSIS:  1. Stage IV poorly differentiated non small cell left lung cancer diagnosed by mediastinal mass biopsy on 02/07/11 (adenocarcinoma, acinar pattern). Negative for EGFR/Kras/ALK mutations. PET scan Feb 2012 showed abnormalities in neck and chest, chest wall, the body of T1, and in the left iliac bone suggestive of bony involvement. On [employer], completed Carboplatinum/Alimta x 4, now on maintenance Alimta since 06/01/11.  2. 2D-ECHOcardiogram Nov 2014 showed mild valvular abnormalities (done for persistent leg edema) - on monitoring by cardiology.    HISTORY OF PRESENT ILLNESS:  Patient returns for continued oncology followup, he is on Alimta chemotherapy, getting treatment on clinical trial. States that he is doing well except for intermittent pain in left knee, states he had MRI left knee at [facility]) reportedly showed meniscus tear and possible arthritis. Also states he had cough and purulent sputum 2 weeks ago which has improved with oral antibiotics (Z-pak). States he still plays tennis regularly. He has issues with chronic mild swelling in both legs especially at the end of the day but denies any pain or rash, states currently swelling is doing better, on Aldactone and takes Lasix as needed. Denies any current cough, dyspnea, orthopnea or PND. No hemoptysis or chest pain. No nausea, vomiting or diarrhea. No peripheral neuropathy. No new skin rash. No pain issues, 0/10.  No new mood disturbances.   REVIEW OF SYSTEMS:   ROS As in HPI above. In addition, no fever, chills or sweats. No new headaches or focal weakness.  No new mood disturbances. No  sore throat, cough, shortness of breath, sputum,  hemoptysis or chest pain. No dizziness or palpitation. No abdominal pain, constipation, diarrhea, dysuria or hematuria. No new skin rash or bleeding symptoms. No new paresthesias in extremities. PS ECOG 0.  PAST MEDICAL HISTORY: Past Medical History  Diagnosis Date  . Lung cancer   . Allergy   . Mild valvular heart disease Nov. 2014    per 2 D Echocardiogram done for persistent leg edema, monitored by cardiology  Denies history of diabetes, hypertension, heart disease, stroke, liver disease or kidney problems.  PAST SURGICAL HISTORY: Past Surgical History  Procedure Laterality Date  . Elbow surgery    . Left anterior thoracotomy with biopsy  March 2012  . Portacath placement  March 2012    FAMILY HISTORY Family History  Problem Relation Age of Onset  . Leukemia Father     ADVANCED DIRECTIVES:   SOCIAL HISTORY: History  Substance Use Topics  . Smoking status: Former Smoker    Types: Cigarettes  . Smokeless tobacco: Not on file  . Alcohol Use: 0.0 oz/week    0 Standard drinks or equivalent per week     Comment: "social drinker"   Currently nonsmoker (minimal smoking history many years ago), denies second-hand smoke exposure. Social alcohol. Works as a Musician.  Allergies  Allergen Reactions  . Penicillins     Reaction and severity unknown    Current Outpatient Prescriptions  Medication Sig Dispense Refill  . folic acid (FOLVITE) 1 MG tablet Take 1 mg by mouth daily.    . furosemide (LASIX) 20 MG tablet Take 20 mg by mouth daily. One tablet daily as needed for swelling    . meloxicam (MOBIC)  7.5 MG tablet Take 7.5 mg by mouth daily.    . Multiple Vitamin (MULTIVITAMIN) capsule Take 1 capsule by mouth daily.    . ondansetron (ZOFRAN-ODT) 4 MG disintegrating tablet Take 4 mg by mouth every 4 (four) hours as needed for nausea or vomiting.    . senna (SENOKOT) 8.6 MG tablet Take 1 tablet by mouth daily. Take as needed around time of chemotherapy    .  spironolactone (ALDACTONE) 25 MG tablet Take 25 mg by mouth daily.    . vitamin B-12 (CYANOCOBALAMIN) 1000 MCG tablet Take 1,000 mcg by mouth daily.     No current facility-administered medications for this visit.    OBJECTIVE: Filed Vitals:   04/15/15 1428  BP: 122/77  Pulse: 70  Temp: 96.1 F (35.6 C)     Body mass index is 28.72 kg/(m^2).    ECOG FS:0 - Asymptomatic  GENERAL: Patient is alert and oriented and in no acute distress. There is no icterus. HEENT: EOMs intact. Oral exam negative for thrush or lesions. No cervical lymphadenopathy. CVS: S1S2, regular LUNGS: Bilaterally clear to auscultation, no rhonchi. ABDOMEN: Soft, nontender. No hepatosplenomegaly clinically.  NEURO: grossly nonfocal, cranial nerves are intact. Gait unremarkable. EXTREMITIES: b/l trace pedal edema. LYMPHATICS: No palpable adenopathy in axillary or inguinal areas.   LAB RESULTS:     Component Value Date/Time   NA 138 12/10/2014 1341   K 3.6 12/10/2014 1341   CL 102 12/10/2014 1341   CO2 30 12/10/2014 1341   GLUCOSE 160* 12/10/2014 1341   BUN 21* 12/10/2014 1341   CREATININE 1.21 04/15/2015 1416   CREATININE 1.10 03/25/2015 1453   CALCIUM 8.4* 12/10/2014 1341   PROT 6.9 04/15/2015 1416   PROT 6.6 12/10/2014 1341   ALBUMIN 3.7 04/15/2015 1416   ALBUMIN 3.4 12/10/2014 1341   AST 27 04/15/2015 1416   AST 17 12/10/2014 1341   ALT 21 04/15/2015 1416   ALT 23 12/10/2014 1341   ALKPHOS 45 04/15/2015 1416   ALKPHOS 50 12/10/2014 1341   BILITOT 0.7 04/15/2015 1416   GFRNONAA >60 04/15/2015 1416   GFRNONAA >60 03/25/2015 1453   GFRAA >60 04/15/2015 1416   GFRAA >60 03/25/2015 1453    No results found for: SPEP, UPEP  Lab Results  Component Value Date   WBC 6.2 04/15/2015   NEUTROABS 3.9 04/15/2015   HGB 13.1 04/15/2015   HCT 39.0* 04/15/2015   MCV 91.3 04/15/2015   PLT 191 04/15/2015             STUDIES: 08/23/14 - CT scan of chest and abdomen with contrast. IMPRESSION:  1.  No acute findings and no evidence for metastatic disease within the chest abdomen or pelvis.  12/06/14 - CT scan of chest and abdomen with contrast. IMPRESSION:  1. No acute findings and no evidence for metastatic disease within the chest or abdomen.  2. 3 mm right middle lobe nodule is unchanged from previous exam. Attention on followup imaging recommended.  3. Aneurysmal dilatation of the ascending aorta. Recommend annual imaging followup by CTA or MRA.  ASSESSMENT / PLAN:   1. Stage IV poorly differentiated non small cell left lung cancer diagnosed by mediastinal mass biopsy on 02/07/11 (adenocarcinoma, acinar pattern). Negative for EGFR/Kras/ALK mutations - have independently reviewed recent CT scan and d/w patient and explained there is no evidence of recurrent/progressive lung cancer or metastatic disease. Have reviewed labs from today. Performance status is good, PS ECOG 0. Lower extremity edema is same (still grade 1),  continue Aldactone alongwith Lasix as needed. Last 2D-ECHOcardiogram Nov 2014 showed mild valvular abnormalities (done for persistent leg edema), no cardiac symptoms. Last 24-hour urine did not show significant proteinuria. Took antibiotic (Z-pak) for recent respiratory tract infection (grade 2) but symptoms have improved currently. Blood counts are adequate for treatment, will proceed with next cycle of maintenance chemotherapy per protocol guidelines with Alimta 500 mg/m2 IV. Continue to monitor weekly labs, next MD followup in 3 weeks with labs and plan continued treatment.   2. Hyperglycemia - per patient blood sugar at home continues to do well. He is following diabetic diet. Will continue to monitor. 3. Lower extremity edema - chronic, mild. Continue on current dose of diuretics and monitor.  4. Thrombocytopenia - grade 1, no bleeding issues. Platelet count better today. Continue to monitor.  5. Nutrition - eating balanced diet. Recent mild weight loss is intentional given increased  workouts and playing tennis. 6. MRI left knee at [facility]) reportedly showed menisucu tear (grade 1) and possible arthritis (grade 1). In between visits, he was advised to call or come to ER in case of fevers, bleeding, acute sickness or new symptoms. He is agreeable to above plan.      [name], MD   04/28/2015 12:19 AM

## 2015-05-05 ENCOUNTER — Telehealth: Payer: Self-pay | Admitting: Family Medicine

## 2015-05-05 NOTE — Telephone Encounter (Signed)
Pt would like to speak with Dr. Rosanna Randy about a medication he is on. Pt wouldn't disclose the name of the medication just that he would like to speak with Dr. Rosanna Randy about it. There is a message in All scripts from 04/28/15 as well. Thanks TNP

## 2015-05-05 NOTE — Telephone Encounter (Signed)
Pt having issues with cost of ED drugs--tald him to try Cialis and have sent in sildenafil also.

## 2015-05-06 ENCOUNTER — Other Ambulatory Visit: Payer: Self-pay | Admitting: *Deleted

## 2015-05-06 ENCOUNTER — Inpatient Hospital Stay (HOSPITAL_BASED_OUTPATIENT_CLINIC_OR_DEPARTMENT_OTHER): Payer: BLUE CROSS/BLUE SHIELD | Admitting: Internal Medicine

## 2015-05-06 ENCOUNTER — Encounter (INDEPENDENT_AMBULATORY_CARE_PROVIDER_SITE_OTHER): Payer: Self-pay

## 2015-05-06 ENCOUNTER — Encounter: Payer: Self-pay | Admitting: *Deleted

## 2015-05-06 ENCOUNTER — Inpatient Hospital Stay: Payer: BLUE CROSS/BLUE SHIELD

## 2015-05-06 ENCOUNTER — Inpatient Hospital Stay: Payer: BLUE CROSS/BLUE SHIELD | Attending: Internal Medicine

## 2015-05-06 VITALS — BP 119/79 | HR 85 | Temp 97.9°F | Resp 18 | Ht 70.0 in | Wt 198.4 lb

## 2015-05-06 DIAGNOSIS — Z79899 Other long term (current) drug therapy: Secondary | ICD-10-CM | POA: Diagnosis not present

## 2015-05-06 DIAGNOSIS — D696 Thrombocytopenia, unspecified: Secondary | ICD-10-CM

## 2015-05-06 DIAGNOSIS — I38 Endocarditis, valve unspecified: Secondary | ICD-10-CM | POA: Insufficient documentation

## 2015-05-06 DIAGNOSIS — M23207 Derangement of unspecified meniscus due to old tear or injury, left knee: Secondary | ICD-10-CM | POA: Insufficient documentation

## 2015-05-06 DIAGNOSIS — Z87891 Personal history of nicotine dependence: Secondary | ICD-10-CM | POA: Insufficient documentation

## 2015-05-06 DIAGNOSIS — C3490 Malignant neoplasm of unspecified part of unspecified bronchus or lung: Secondary | ICD-10-CM

## 2015-05-06 DIAGNOSIS — Z9221 Personal history of antineoplastic chemotherapy: Secondary | ICD-10-CM | POA: Diagnosis not present

## 2015-05-06 DIAGNOSIS — R739 Hyperglycemia, unspecified: Secondary | ICD-10-CM

## 2015-05-06 DIAGNOSIS — C3492 Malignant neoplasm of unspecified part of left bronchus or lung: Secondary | ICD-10-CM | POA: Diagnosis not present

## 2015-05-06 DIAGNOSIS — R600 Localized edema: Secondary | ICD-10-CM

## 2015-05-06 LAB — CBC WITH DIFFERENTIAL/PLATELET
Basophils Absolute: 0 10*3/uL (ref 0–0.1)
Basophils Relative: 0 %
Eosinophils Absolute: 0.1 10*3/uL (ref 0–0.7)
Eosinophils Relative: 2 %
HCT: 42.8 % (ref 40.0–52.0)
Hemoglobin: 14.2 g/dL (ref 13.0–18.0)
LYMPHS ABS: 1.5 10*3/uL (ref 1.0–3.6)
Lymphocytes Relative: 27 %
MCH: 30.8 pg (ref 26.0–34.0)
MCHC: 33.1 g/dL (ref 32.0–36.0)
MCV: 93 fL (ref 80.0–100.0)
MONOS PCT: 8 %
Monocytes Absolute: 0.4 10*3/uL (ref 0.2–1.0)
Neutro Abs: 3.4 10*3/uL (ref 1.4–6.5)
Neutrophils Relative %: 63 %
Platelets: 155 10*3/uL (ref 150–440)
RBC: 4.6 MIL/uL (ref 4.40–5.90)
RDW: 14.2 % (ref 11.5–14.5)
WBC: 5.4 10*3/uL (ref 3.8–10.6)

## 2015-05-06 LAB — CREATININE, SERUM
Creatinine, Ser: 1.23 mg/dL (ref 0.61–1.24)
GFR calc Af Amer: >60 mL/min (ref >60)

## 2015-05-06 MED ORDER — SODIUM CHLORIDE 0.9 % IV SOLN
Freq: Once | INTRAVENOUS | Status: AC
  Administered 2015-05-06: 15:00:00 via INTRAVENOUS
  Filled 2015-05-06: qty 4

## 2015-05-06 MED ORDER — SODIUM CHLORIDE 0.9 % IV SOLN
1050.0000 mg | Freq: Once | INTRAVENOUS | Status: AC
  Administered 2015-05-06: 1050 mg via INTRAVENOUS
  Filled 2015-05-06: qty 42

## 2015-05-06 MED ORDER — SODIUM CHLORIDE 0.9 % IV SOLN
Freq: Once | INTRAVENOUS | Status: AC
  Administered 2015-05-06: 15:00:00 via INTRAVENOUS
  Filled 2015-05-06: qty 1000

## 2015-05-06 MED ORDER — HEPARIN SOD (PORK) LOCK FLUSH 100 UNIT/ML IV SOLN
500.0000 [IU] | Freq: Once | INTRAVENOUS | Status: AC | PRN
  Administered 2015-05-06: 500 [IU]
  Filled 2015-05-06: qty 5

## 2015-05-06 NOTE — Progress Notes (Signed)
Pt has no complaints today, he does still have swelling of ext. Is about the same left leg  more than right leg

## 2015-05-06 NOTE — Progress Notes (Signed)
Lilly S-130 Protocol Patient: Mitchell Mosley returns to clinic today for continued maintenance treatment with Pemetrexed via protocol.  He reports that he is doing well with a good appetite and energy level.  He continues to have mild intermittent swelling of his ankles ( Grade 1) for which he uses diuretics prn.  He called on 5/18 to report some light-headedness ( grade 1)while in the shower.  He denies any further episodes of this and wonders if it could have been related to accumulation of fluid in his inner ear.  His hemoglobin was 13.1 at the time and we discussed possible anemia as a cause.  Today his hemoglobin is 14.2. His performance status is = 0.  He is cleared for treatment today by Dr. Ma Hillock.    Mitchell Samples, RN

## 2015-05-09 ENCOUNTER — Ambulatory Visit: Payer: BLUE CROSS/BLUE SHIELD

## 2015-05-24 NOTE — Progress Notes (Signed)
[place]  Telephone:(336) [phone] Fax:(336) [phone]     ID: ZAIDAN KEEBLE OB: 09-20-1961  MR#: [id number]  OAC#:[id number]  Patient Care Team: [name], MD as PCP - General (Family Medicine)  CHIEF COMPLAINT/DIAGNOSIS:  1. Stage IV poorly differentiated non small cell left lung cancer diagnosed by mediastinal mass biopsy on 02/07/11 (adenocarcinoma, acinar pattern). Negative for EGFR/Kras/ALK mutations. PET scan Feb 2012 showed abnormalities in neck and chest, chest wall, the body of T1, and in the left iliac bone suggestive of bony involvement. On [employer], completed Carboplatinum/Alimta x 4, now on maintenance Alimta since 06/01/11.  2. 2D-ECHOcardiogram Nov 2014 showed mild valvular abnormalities (done for persistent leg edema) - on monitoring by cardiology.    HISTORY OF PRESENT ILLNESS:  Patient returns for continued oncology followup, he is on Alimta chemotherapy, enrolled on clinical trial. States that he is doing well and denies new complaints. States he still plays tennis regularly. He has issues with chronic mild swelling in both legs especially at the end of the day but denies any pain or rash, states currently swelling is doing better, on Aldactone and takes Lasix as needed. Denies any current cough, dyspnea, orthopnea or PND. No hemoptysis or chest pain. No nausea, vomiting or diarrhea. No peripheral neuropathy. No new skin rash. No new mood disturbances.  No pain issues, 0/10.   REVIEW OF SYSTEMS:   ROS As in HPI above. In addition, no fever, chills or sweats. No new headaches or focal weakness.  No new mood disturbances. No  sore throat, cough, shortness of breath, sputum, hemoptysis or chest pain. No dizziness or palpitation. No abdominal pain, constipation, diarrhea, dysuria or hematuria. No new skin rash or bleeding symptoms. No new paresthesias in extremities. PS ECOG 0.  PAST MEDICAL HISTORY: Reviewed Past Medical  History  Diagnosis Date  . Lung cancer   . Allergy   . Mild valvular heart disease Nov. 2014    per 2 D Echocardiogram done for persistent leg edema, monitored by cardiology  Denies history of diabetes, hypertension, heart disease, stroke, liver disease or kidney problems.  PAST SURGICAL HISTORY:Reviewed Past Surgical History  Procedure Laterality Date  . Elbow surgery    . Left anterior thoracotomy with biopsy  March 2012  . Portacath placement  March 2012    FAMILY HISTORY:Reviewed Family History  Problem Relation Age of Onset  . Leukemia Father     ADVANCED DIRECTIVES:  No - patient declined information  SOCIAL HISTORY: Reviewed History  Substance Use Topics  . Smoking status: Former Smoker    Types: Cigarettes  . Smokeless tobacco: Not on file  . Alcohol Use: 0.0 oz/week    0 Standard drinks or equivalent per week     Comment: "social drinker"   Currently nonsmoker (minimal smoking history many years ago), denies second-hand smoke exposure. Social alcohol. Works as a Musician.  Allergies  Allergen Reactions  . Penicillins     Reaction and severity unknown    Current Outpatient Prescriptions  Medication Sig Dispense Refill  . folic acid (FOLVITE) 1 MG tablet Take 1 mg by mouth daily.    . furosemide (LASIX) 20 MG tablet Take 20 mg by mouth daily. One tablet daily as needed for swelling    . meloxicam (MOBIC) 7.5 MG tablet Take 7.5 mg by mouth daily.    . Multiple Vitamin (MULTIVITAMIN) capsule Take 1 capsule by mouth daily.    . ondansetron (ZOFRAN-ODT) 4 MG disintegrating  tablet Take 4 mg by mouth every 4 (four) hours as needed for nausea or vomiting.    . senna (SENOKOT) 8.6 MG tablet Take 1 tablet by mouth daily. Take as needed around time of chemotherapy    . spironolactone (ALDACTONE) 25 MG tablet Take 25 mg by mouth daily.    . vitamin B-12 (CYANOCOBALAMIN) 1000 MCG tablet Take 1,000 mcg by mouth daily.     No current facility-administered  medications for this visit.    OBJECTIVE: Filed Vitals:   05/06/15 1419  BP: 119/79  Pulse: 85  Temp: 97.9 F (36.6 C)  Resp: 18     Body mass index is 28.47 kg/(m^2).    ECOG FS:0 - Asymptomatic  GENERAL: Patient is alert and oriented and in no acute distress. There is no icterus. HEENT: EOMs intact. Oral exam negative for thrush or lesions. No cervical lymphadenopathy. CVS: S1S2, regular LUNGS: Bilaterally clear to auscultation, no rhonchi. ABDOMEN: Soft, nontender. No hepatosplenomegaly clinically.  NEURO: grossly nonfocal, cranial nerves are intact. Gait unremarkable. EXTREMITIES: No pedal edema.   LAB RESULTS:    Component Value Date/Time   NA 138 12/10/2014 1341   K 3.6 12/10/2014 1341   CL 102 12/10/2014 1341   CO2 30 12/10/2014 1341   GLUCOSE 160* 12/10/2014 1341   BUN 21* 12/10/2014 1341   CREATININE 1.23 05/06/2015 1108   CREATININE 1.10 03/25/2015 1453   CALCIUM 8.4* 12/10/2014 1341   PROT 6.9 04/15/2015 1416   PROT 6.6 12/10/2014 1341   ALBUMIN 3.7 04/15/2015 1416   ALBUMIN 3.4 12/10/2014 1341   AST 27 04/15/2015 1416   AST 17 12/10/2014 1341   ALT 21 04/15/2015 1416   ALT 23 12/10/2014 1341   ALKPHOS 45 04/15/2015 1416   ALKPHOS 50 12/10/2014 1341   BILITOT 0.7 04/15/2015 1416   GFRNONAA >60 05/06/2015 1108   GFRNONAA >60 03/25/2015 1453   GFRAA >60 05/06/2015 1108   GFRAA >60 03/25/2015 1453   Lab Results  Component Value Date   WBC 5.4 05/06/2015   NEUTROABS 3.4 05/06/2015   HGB 14.2 05/06/2015   HCT 42.8 05/06/2015   MCV 93.0 05/06/2015   PLT 155 05/06/2015   STUDIES: 08/23/14 - CT scan of chest and abdomen with contrast. IMPRESSION: 1. No acute findings and no evidence for metastatic disease within the chest abdomen or pelvis.  12/06/14 - CT scan of chest and abdomen with contrast. IMPRESSION: 1. No acute findings and no evidence for metastatic disease within the chest or abdomen. 2. 3 mm right middle lobe nodule is unchanged from previous  exam. Attention on followup imaging recommended. 3. Aneurysmal dilatation of the ascending aorta. Recommend annual imaging followup by CTA or MRA.  03/21/15 - CT scan. IMPRESSION:  No CT evidence for intra-abdominal or thoracic recurrence or metastatic disease. Stable 3 mm right middle lobe pulmonary nodule. No acute abnormality within the chest or abdomen. No significant change in aneurysmal dilatation of the ascending aorta allowing for differences in technique. Recommend annual imaging followup by CTA or MRA.   STAGING: Stage 4 lung cancer  Staging form: Lung, AJCC 7th Edition     Clinical stage from 05/24/2015: Stage IV (TX, NX, [name], MD on 05/24/2015.   ASSESSMENT / PLAN:   1. Stage IV poorly differentiated non small cell left lung cancer diagnosed by mediastinal mass biopsy on 02/07/11 (adenocarcinoma, acinar pattern). Negative for EGFR/Kras/ALK mutations - have independently reviewed recent CT scan and d/w patient and explained  there is no evidence of recurrent/progressive lung cancer or metastatic disease. Have reviewed labs from today. Performance status is good, PS ECOG 0. Lower extremity edema is same (still grade 1), continue Aldactone alongwith Lasix as needed. Last 2D-ECHOcardiogram Nov 2014 showed mild valvular abnormalities (done for persistent leg edema), no cardiac symptoms. Last 24-hour urine did not show significant proteinuria - reviewed labs from today and d/w patient. Blood counts are adequate for treatment, will proceed with next cycle of maintenance chemotherapy per protocol guidelines with Alimta 500 mg/m2 IV. Continue to monitor weekly labs, next MD followup in 3 weeks with labs and plan continued treatment.  2. Hyperglycemia - per patient blood sugar at home continues to do well. He is following diabetic diet. Will monitor. 3. Lower extremity edema - chronic, mild. Continue on current dose of diuretics and monitor.  4. Thrombocytopenia - grade 1,  no bleeding issues. Platelet count better today. Continue to monitor.  5. Nutrition - eating balanced diet. Recent mild weight loss is intentional given increased workouts and playing tennis. 6. MRI left knee at [facility]) reportedly showed menisucu tear (grade 1) and possible arthritis (grade 1). In between visits, he was advised to call or come to ER in case of fevers, bleeding, acute sickness or new symptoms. He is agreeable to above plan.    [name], MD   05/24/2015 10:49 PM     -

## 2015-05-26 ENCOUNTER — Other Ambulatory Visit: Payer: Self-pay | Admitting: *Deleted

## 2015-05-26 ENCOUNTER — Encounter: Payer: Self-pay | Admitting: Internal Medicine

## 2015-05-26 DIAGNOSIS — C3492 Malignant neoplasm of unspecified part of left bronchus or lung: Secondary | ICD-10-CM

## 2015-05-26 DIAGNOSIS — C3490 Malignant neoplasm of unspecified part of unspecified bronchus or lung: Secondary | ICD-10-CM

## 2015-05-26 MED ORDER — CYANOCOBALAMIN 1000 MCG/ML IJ SOLN: 1000.0000 ug | Freq: Once | INTRAMUSCULAR | Status: DC

## 2015-05-27 ENCOUNTER — Encounter: Payer: Self-pay | Admitting: *Deleted

## 2015-05-27 ENCOUNTER — Inpatient Hospital Stay: Payer: BLUE CROSS/BLUE SHIELD

## 2015-05-27 ENCOUNTER — Inpatient Hospital Stay (HOSPITAL_BASED_OUTPATIENT_CLINIC_OR_DEPARTMENT_OTHER): Payer: BLUE CROSS/BLUE SHIELD | Admitting: Hematology and Oncology

## 2015-05-27 VITALS — BP 120/74 | HR 66 | Temp 96.8°F | Resp 18 | Ht 70.0 in | Wt 202.2 lb

## 2015-05-27 DIAGNOSIS — D696 Thrombocytopenia, unspecified: Secondary | ICD-10-CM

## 2015-05-27 DIAGNOSIS — C3490 Malignant neoplasm of unspecified part of unspecified bronchus or lung: Secondary | ICD-10-CM

## 2015-05-27 DIAGNOSIS — Z79899 Other long term (current) drug therapy: Secondary | ICD-10-CM

## 2015-05-27 DIAGNOSIS — C3492 Malignant neoplasm of unspecified part of left bronchus or lung: Secondary | ICD-10-CM | POA: Diagnosis not present

## 2015-05-27 DIAGNOSIS — R739 Hyperglycemia, unspecified: Secondary | ICD-10-CM | POA: Diagnosis not present

## 2015-05-27 DIAGNOSIS — Z9221 Personal history of antineoplastic chemotherapy: Secondary | ICD-10-CM

## 2015-05-27 DIAGNOSIS — R600 Localized edema: Secondary | ICD-10-CM

## 2015-05-27 LAB — CBC WITH DIFFERENTIAL/PLATELET
BASOS PCT: 1 %
Basophils Absolute: 0 10*3/uL (ref 0–0.1)
EOS ABS: 0.1 10*3/uL (ref 0–0.7)
EOS PCT: 2 %
HEMATOCRIT: 42 % (ref 40.0–52.0)
Hemoglobin: 13.8 g/dL (ref 13.0–18.0)
Lymphocytes Relative: 22 %
Lymphs Abs: 1.3 10*3/uL (ref 1.0–3.6)
MCH: 30.7 pg (ref 26.0–34.0)
MCHC: 32.9 g/dL (ref 32.0–36.0)
MCV: 93.3 fL (ref 80.0–100.0)
MONO ABS: 0.5 10*3/uL (ref 0.2–1.0)
Monocytes Relative: 9 %
Neutro Abs: 4.1 10*3/uL (ref 1.4–6.5)
Neutrophils Relative %: 66 %
Platelets: 153 10*3/uL (ref 150–440)
RBC: 4.5 MIL/uL (ref 4.40–5.90)
RDW: 14.6 % — AB (ref 11.5–14.5)
WBC: 6.2 10*3/uL (ref 3.8–10.6)

## 2015-05-27 LAB — CREATININE, SERUM
Creatinine, Ser: 1.26 mg/dL — ABNORMAL HIGH (ref 0.61–1.24)
GFR calc Af Amer: >60 mL/min (ref >60)
GFR calc non Af Amer: >60 mL/min (ref >60)

## 2015-05-27 MED ORDER — SODIUM CHLORIDE 0.9 % IJ SOLN
10.0000 mL | INTRAMUSCULAR | Status: AC | PRN
  Administered 2015-05-27: 10 mL
  Filled 2015-05-27: qty 10

## 2015-05-27 MED ORDER — SODIUM CHLORIDE 0.9 % IV SOLN
1050.0000 mg | Freq: Once | INTRAVENOUS | Status: AC
  Administered 2015-05-27: 1050 mg via INTRAVENOUS
  Filled 2015-05-27: qty 42

## 2015-05-27 MED ORDER — HEPARIN SOD (PORK) LOCK FLUSH 100 UNIT/ML IV SOLN
500.0000 [IU] | Freq: Once | INTRAVENOUS | Status: AC | PRN
  Administered 2015-05-27: 500 [IU]
  Filled 2015-05-27: qty 5

## 2015-05-27 MED ORDER — SODIUM CHLORIDE 0.9 % IV SOLN
Freq: Once | INTRAVENOUS | Status: AC
  Administered 2015-05-27: 16:00:00 via INTRAVENOUS
  Filled 2015-05-27: qty 1000

## 2015-05-27 MED ORDER — SODIUM CHLORIDE 0.9 % IV SOLN
Freq: Once | INTRAVENOUS | Status: AC
  Administered 2015-05-27: 16:00:00 via INTRAVENOUS
  Filled 2015-05-27: qty 4

## 2015-05-27 MED ORDER — CYANOCOBALAMIN 1000 MCG/ML IJ SOLN
1000.0000 ug | Freq: Once | INTRAMUSCULAR | Status: AC
  Administered 2015-05-27: 1000 ug via INTRAMUSCULAR
  Filled 2015-05-27: qty 1

## 2015-05-27 NOTE — Progress Notes (Signed)
Research Encounter Note:  The patient comes to clinic today for treatment # 70 of maintenance Pemetrexed per the Lilly S-130 Protocol.  He remains physically active teaching tennis.  His performance status=0.  His review of systems is negative with the exception of continued intermittent, grade 1 edema of his ankles for which he takes Lasix daily. His serum creatinine is 1.26 slightly elevated above the ULN but this is not considered clinically significant. His CBC is within normal limits. He is seen by Dr. Mike Gip today as Dr. Ma Hillock is out of the office. He is cleared for today's treatment and his B12 is ordered for today.  His medication record does not show his over the counter histamine-2 blocker nor his Omega 3 fatty acid supplement but he reports that he continues to use the Histamine-2 blocker prn and takes Omega 3 fatty acids daily.   [name], RN

## 2015-05-28 ENCOUNTER — Encounter: Payer: Self-pay | Admitting: Hematology and Oncology

## 2015-05-28 NOTE — Progress Notes (Signed)
[facility] day:  05/27/2015  Chief Complaint: Mitchell Mosley is a 54 y.o. male with stage IV poorly differentiated non-small cell lung cancer who is seen for assessment prior to maintenance cycle # 66 Alimta on [place] protocol [id number].  HPI: The patient was last seen in the medical oncology clinic on 05/06/2015.  At that time, he was seen by Dr. Ma Hillock. Clinically, he was doing well. He was active playing tennis. He noted a ittle bit of a mild swelling in his legs at the end of the day. He denied any respiratory symptoms. He received his chemotherapy uneventfully.  During the interim, he has done well. He feels "great". He denies any problems. He is taking his folic acid.  Past Medical History  Diagnosis Date  . Lung cancer   . Allergy   . Mild valvular heart disease Nov. 2014    per 2 D Echocardiogram done for persistent leg edema, monitored by cardiology    Past Surgical History  Procedure Laterality Date  . Elbow surgery    . Left anterior thoracotomy with biopsy  March 2012  . Portacath placement  March 2012    Family History  Problem Relation Age of Onset  . Leukemia Father     Social History:  reports that he has quit smoking. His smoking use included Cigarettes. He does not have any smokeless tobacco history on file. He reports that he drinks alcohol. He reports that he does not use illicit drugs.  The patient is alone today.  Allergies:  Allergies  Allergen Reactions  . Penicillins     Reaction and severity unknown    Current Medications: Current Outpatient Prescriptions  Medication Sig Dispense Refill  . folic acid (FOLVITE) 1 MG tablet Take 1 mg by mouth daily.    . furosemide (LASIX) 20 MG tablet Take 20 mg by mouth daily. One tablet daily as needed for swelling    . meloxicam (MOBIC) 7.5 MG tablet Take 7.5 mg by mouth daily.    . Multiple Vitamin (MULTIVITAMIN) capsule Take 1 capsule by mouth daily.    .  ondansetron (ZOFRAN-ODT) 4 MG disintegrating tablet Take 4 mg by mouth every 4 (four) hours as needed for nausea or vomiting.    . senna (SENOKOT) 8.6 MG tablet Take 1 tablet by mouth daily. Take as needed around time of chemotherapy    . spironolactone (ALDACTONE) 25 MG tablet Take 25 mg by mouth daily.    . vitamin B-12 (CYANOCOBALAMIN) 1000 MCG tablet Take 1,000 mcg by mouth daily.     No current facility-administered medications for this visit.   Facility-Administered Medications Ordered in Other Visits  Medication Dose Route Frequency Provider Last Rate Last Dose  . sodium chloride 0.9 % injection 10 mL  10 mL Intracatheter PRN [name], MD   10 mL at 05/27/15 1543    Review of Systems:  GENERAL:  Feels good.  Active.  No fevers, sweats or weight loss. PERFORMANCE STATUS (ECOG):  0 HEENT:  No visual changes, runny nose, sore throat, mouth sores or tenderness. Lungs: No shortness of breath or cough.  No hemoptysis. Cardiac:  No chest pain, palpitations, orthopnea, or PND. GI:  No nausea, vomiting, diarrhea, constipation, melena or hematochezia. GU:  No urgency, frequency, dysuria, or hematuria. Musculoskeletal:  No back pain.  No joint pain.  No muscle tenderness. Extremities:  No pain or swelling. Skin:  No rashes or skin changes. Neuro:  No  headache, numbness or weakness, balance or coordination issues. Endocrine:  No diabetes, thyroid issues, hot flashes or night sweats. Psych:  No mood changes, depression or anxiety. Pain:  No focal pain. Review of systems:  All other systems reviewed and found to be negative.   Physical Exam: There were no vitals taken for this visit.  GENERAL:  Well developed, well nourished, sitting comfortably in the exam room in no acute distress. MENTAL STATUS:  Alert and oriented to person, place and time. HEAD:  Dark hair.  Normocephalic, atraumatic, face symmetric, no Cushingoid features. EYES:  Brown eyes.  Pupils equal round and reactive to  light and accomodation.  No conjunctivitis or scleral icterus. ENT:  Oropharynx clear without lesion.  Tongue normal. Mucous membranes moist.  RESPIRATORY:  Clear to auscultation without rales, wheezes or rhonchi. CARDIOVASCULAR:  Regular rate and rhythm without murmur, rub or gallop. ABDOMEN:  Soft, non-tender, with active bowel sounds, and no hepatosplenomegaly.  No masses. SKIN:  No rashes, ulcers or lesions. EXTREMITIES: No edema, no skin discoloration or tenderness.  No palpable cords. LYMPH NODES: No palpable cervical, supraclavicular, axillary or inguinal adenopathy  NEUROLOGICAL: Unremarkable. PSYCH:  Appropriate.   Appointment on 05/27/2015  Component Date Value Ref Range Status  . WBC 05/27/2015 6.2  3.8 - 10.6 K/uL Final  . RBC 05/27/2015 4.50  4.40 - 5.90 MIL/uL Final  . Hemoglobin 05/27/2015 13.8  13.0 - 18.0 g/dL Final  . HCT 05/27/2015 42.0  40.0 - 52.0 % Final  . MCV 05/27/2015 93.3  80.0 - 100.0 fL Final  . MCH 05/27/2015 30.7  26.0 - 34.0 pg Final  . MCHC 05/27/2015 32.9  32.0 - 36.0 g/dL Final  . RDW 05/27/2015 14.6* 11.5 - 14.5 % Final  . Platelets 05/27/2015 153  150 - 440 K/uL Final  . Neutrophils Relative % 05/27/2015 66   Final  . Neutro Abs 05/27/2015 4.1  1.4 - 6.5 K/uL Final  . Lymphocytes Relative 05/27/2015 22   Final  . Lymphs Abs 05/27/2015 1.3  1.0 - 3.6 K/uL Final  . Monocytes Relative 05/27/2015 9   Final  . Monocytes Absolute 05/27/2015 0.5  0.2 - 1.0 K/uL Final  . Eosinophils Relative 05/27/2015 2   Final  . Eosinophils Absolute 05/27/2015 0.1  0 - 0.7 K/uL Final  . Basophils Relative 05/27/2015 1   Final  . Basophils Absolute 05/27/2015 0.0  0 - 0.1 K/uL Final  . Creatinine, Ser 05/27/2015 1.26* 0.61 - 1.24 mg/dL Final  . GFR calc non Af Amer 05/27/2015 >60  >60 mL/min Final  . GFR calc Af Amer 05/27/2015 >60  >60 mL/min Final   Comment: (NOTE) The eGFR has been calculated using the CKD EPI equation. This calculation has not been validated in  all clinical situations. eGFR's persistently <60 mL/min signify possible Chronic Kidney Disease.     Assessment:  Mitchell Mosley is a 54 y.o. male with stage IV poorly differentiated non-small cell lung cancer diagnosed by mediastinal C on 02/07/2011. Pathology revealed adenocarcinoma, acinar pattern. Tumor was negative for EGFR, K-ras, and ALK.  He is treated on [employer].  He initially received 4 cycles of carboplatin and Alimta. He has been on maintenance Alimta since 0629/2012.  He has received 69 cycles to date (last 05/06/2015).   Symptomatically, he is tolerating his therapy well. He denies complaints. Exam is unremarkable.  Plan: 1. Labs:  CBC with diff, Cr. 2. Cycle #70 Alimta today. 3. B12 1000 mcg IM  today. 4. Anticipate follow-up imaging per protocol. 5. RTC in 3 weeks for MD assessment, labs, and cycle #71 Alimta.  Lequita Asal, MD  05/28/2015, 6:34 PM

## 2015-05-31 ENCOUNTER — Telehealth: Payer: Self-pay

## 2015-05-31 NOTE — Telephone Encounter (Signed)
Spoke with insurance company in regards to Cialis (in alscripts) for 20 mg and this approved for 6 tablets every 25 days no more than that in that period. Patient advised and he states he does not want this filled right now but to keep the information on file for now.-aa

## 2015-06-08 ENCOUNTER — Telehealth: Payer: Self-pay | Admitting: *Deleted

## 2015-06-08 ENCOUNTER — Other Ambulatory Visit: Payer: Self-pay | Admitting: *Deleted

## 2015-06-08 DIAGNOSIS — C3492 Malignant neoplasm of unspecified part of left bronchus or lung: Secondary | ICD-10-CM

## 2015-06-08 MED ORDER — MECLIZINE HCL 25 MG PO TABS: 25.0000 mg | ORAL_TABLET | Freq: Three times a day (TID) | ORAL | Status: DC | PRN

## 2015-06-08 NOTE — Telephone Encounter (Signed)
Pt. called to report that he gets dizzy when turning head from left to right. I inquired whether patient has experienced headache, sinus drainage, sinus pressure or congestion, or sore throat.   He denies any other symptoms.  Dr. Ma Hillock notified and he ordered Meclizine 25 mg PO TID ( sig # 60) for dizziness.  Prescription called into patient's preferred pharmacy and order entered into EPIC. Called patient back and gave him instructions for Meclizine which he was able to repeat back to me.

## 2015-06-17 ENCOUNTER — Encounter: Payer: Self-pay | Admitting: *Deleted

## 2015-06-17 ENCOUNTER — Inpatient Hospital Stay: Payer: BLUE CROSS/BLUE SHIELD

## 2015-06-17 ENCOUNTER — Encounter: Payer: Self-pay | Admitting: Internal Medicine

## 2015-06-17 ENCOUNTER — Other Ambulatory Visit: Payer: Self-pay | Admitting: *Deleted

## 2015-06-17 ENCOUNTER — Inpatient Hospital Stay (HOSPITAL_BASED_OUTPATIENT_CLINIC_OR_DEPARTMENT_OTHER): Payer: BLUE CROSS/BLUE SHIELD | Admitting: Internal Medicine

## 2015-06-17 ENCOUNTER — Inpatient Hospital Stay: Payer: BLUE CROSS/BLUE SHIELD | Attending: Internal Medicine

## 2015-06-17 VITALS — BP 115/69 | HR 69 | Temp 97.3°F | Resp 18 | Ht 70.0 in | Wt 198.4 lb

## 2015-06-17 DIAGNOSIS — I38 Endocarditis, valve unspecified: Secondary | ICD-10-CM

## 2015-06-17 DIAGNOSIS — C3492 Malignant neoplasm of unspecified part of left bronchus or lung: Secondary | ICD-10-CM | POA: Diagnosis not present

## 2015-06-17 DIAGNOSIS — Z5111 Encounter for antineoplastic chemotherapy: Secondary | ICD-10-CM | POA: Diagnosis not present

## 2015-06-17 DIAGNOSIS — C3490 Malignant neoplasm of unspecified part of unspecified bronchus or lung: Secondary | ICD-10-CM

## 2015-06-17 DIAGNOSIS — C3412 Malignant neoplasm of upper lobe, left bronchus or lung: Secondary | ICD-10-CM

## 2015-06-17 DIAGNOSIS — D696 Thrombocytopenia, unspecified: Secondary | ICD-10-CM

## 2015-06-17 DIAGNOSIS — R600 Localized edema: Secondary | ICD-10-CM

## 2015-06-17 DIAGNOSIS — Z9221 Personal history of antineoplastic chemotherapy: Secondary | ICD-10-CM

## 2015-06-17 DIAGNOSIS — R739 Hyperglycemia, unspecified: Secondary | ICD-10-CM | POA: Diagnosis not present

## 2015-06-17 DIAGNOSIS — Z87891 Personal history of nicotine dependence: Secondary | ICD-10-CM

## 2015-06-17 DIAGNOSIS — Z79899 Other long term (current) drug therapy: Secondary | ICD-10-CM | POA: Diagnosis not present

## 2015-06-17 LAB — CBC WITH DIFFERENTIAL/PLATELET
BASOS ABS: 0 10*3/uL (ref 0–0.1)
BASOS PCT: 1 %
EOS ABS: 0.1 10*3/uL (ref 0–0.7)
Eosinophils Relative: 2 %
HCT: 41.8 % (ref 40.0–52.0)
HEMOGLOBIN: 13.9 g/dL (ref 13.0–18.0)
LYMPHS PCT: 27 %
Lymphs Abs: 1.5 10*3/uL (ref 1.0–3.6)
MCH: 31 pg (ref 26.0–34.0)
MCHC: 33.2 g/dL (ref 32.0–36.0)
MCV: 93.6 fL (ref 80.0–100.0)
MONO ABS: 0.5 10*3/uL (ref 0.2–1.0)
MONOS PCT: 10 %
NEUTROS ABS: 3.4 10*3/uL (ref 1.4–6.5)
Neutrophils Relative %: 60 %
PLATELETS: 140 10*3/uL — AB (ref 150–440)
RBC: 4.46 MIL/uL (ref 4.40–5.90)
RDW: 14.3 % (ref 11.5–14.5)
WBC: 5.6 10*3/uL (ref 3.8–10.6)

## 2015-06-17 LAB — HEPATIC FUNCTION PANEL
ALT: 19 U/L (ref 17–63)
AST: 25 U/L (ref 15–41)
Albumin: 4.1 g/dL (ref 3.5–5.0)
Alkaline Phosphatase: 45 U/L (ref 38–126)
BILIRUBIN TOTAL: 0.9 mg/dL (ref 0.3–1.2)
Bilirubin, Direct: <0.1 mg/dL — ABNORMAL LOW (ref 0.1–0.5)
Total Protein: 6.8 g/dL (ref 6.5–8.1)

## 2015-06-17 LAB — CREATININE, URINE, RANDOM: CREATININE, URINE: 83 mg/dL

## 2015-06-17 LAB — CREATININE, SERUM
Creatinine, Ser: 1.25 mg/dL — ABNORMAL HIGH (ref 0.61–1.24)
GFR calc non Af Amer: >60 mL/min (ref >60)

## 2015-06-17 LAB — MAGNESIUM: Magnesium: 2.1 mg/dL (ref 1.7–2.4)

## 2015-06-17 MED ORDER — SODIUM CHLORIDE 0.9 % IV SOLN
1050.0000 mg | Freq: Once | INTRAVENOUS | Status: AC
  Administered 2015-06-17: 1050 mg via INTRAVENOUS
  Filled 2015-06-17: qty 42

## 2015-06-17 MED ORDER — SODIUM CHLORIDE 0.9 % IV SOLN
Freq: Once | INTRAVENOUS | Status: AC
Start: 2015-06-17 — End: 2015-06-17
  Administered 2015-06-17: 15:00:00 via INTRAVENOUS
  Filled 2015-06-17: qty 1000

## 2015-06-17 MED ORDER — SODIUM CHLORIDE 0.9 % IV SOLN
Freq: Once | INTRAVENOUS | Status: AC
  Administered 2015-06-17: 15:00:00 via INTRAVENOUS
  Filled 2015-06-17: qty 4

## 2015-06-17 MED ORDER — SODIUM CHLORIDE 0.9 % IJ SOLN
10.0000 mL | INTRAMUSCULAR | Status: AC | PRN
  Administered 2015-06-17: 10 mL
  Filled 2015-06-17: qty 10

## 2015-06-17 MED ORDER — HEPARIN SOD (PORK) LOCK FLUSH 100 UNIT/ML IV SOLN
500.0000 [IU] | Freq: Once | INTRAVENOUS | Status: AC | PRN
  Administered 2015-06-17: 500 [IU]
  Filled 2015-06-17: qty 5

## 2015-06-17 NOTE — Progress Notes (Signed)
Mitchell Mosley:  Cycle 571/Day 1-The patient arrives in clinic today for Cycle 71 of maintenance Pemetrexed.  He denies any new adverse events and continues to have mild intermittent edema of his ankles ( Grade 1) and intermittent dizziness felt to be related to fluid in his inner ear. He never used the Meclizine that was prescribed for him as his ear "popped" spontaneously and he had some temporaty relief. His weight is down to 90 kg as he continues to work out religiously.  His clinic nurse has recommended he increase his fluid intake with the exceptionally warm weather and given his physical activity.   His performance status = 0.   His serum creatinine is slightly elevated at 1.25 but considered not clinically significant. His CBC is within normal limits. He was seen by Dr. Ma Hillock and approved for today's treatment. A CT of the chest/abdomen with contrast has been ordered for Jul 04, 2015.     [name], RN

## 2015-06-17 NOTE — Progress Notes (Signed)
Pt states if he turns his head really fast he gets dizzy, he had felt a noise for short time and when the noise went away and he felt better.  His edema of legs about the same. No other concerns.

## 2015-06-20 LAB — PROTEIN ELECTRO, RANDOM URINE
ALBUMIN ELP UR: 100 %
ALPHA-2-GLOBULIN, U: 0 %
Alpha-1-Globulin, U: 0 %
Beta Globulin, U: 0 %
Gamma Globulin, U: 0 %
Total Protein, Urine: 4.2 mg/dL

## 2015-06-21 ENCOUNTER — Telehealth: Payer: Self-pay | Admitting: *Deleted

## 2015-06-21 MED ORDER — FOLIC ACID 1 MG PO TABS: 1.0000 mg | ORAL_TABLET | Freq: Every day | ORAL | Status: DC

## 2015-06-21 NOTE — Telephone Encounter (Signed)
Escribed

## 2015-06-22 NOTE — Progress Notes (Signed)
[facility]  [phone] [phone]     ID: Mitchell Mosley OB: 06/08/1961  MR#: [id number]  JYN#:[id number]  Patient Care Team: [name], MD as PCP - General (Family Medicine)  CHIEF COMPLAINT/DIAGNOSIS:  1. Stage IV poorly differentiated non small cell left lung cancer diagnosed by mediastinal mass biopsy on 02/07/11 (adenocarcinoma, acinar pattern). Negative for EGFR/Kras/ALK mutations. PET scan Feb 2012 showed abnormalities in neck and chest, chest wall, the body of T1, and in the left iliac bone suggestive of bony involvement. On [employer], completed Carboplatinum/Alimta x 4, now on maintenance Alimta since 06/01/11.  2. 2D-ECHOcardiogram Nov 2014 showed mild valvular abnormalities (done for persistent leg edema) - on monitoring by cardiology.    HISTORY OF PRESENT ILLNESS:  Patient returns for continued oncology followup and plan next dose of Alimta chemotherapy, he is enrolled on clinical trial. States that he is doing well, denies new complaints. States he still plays tennis regularly. He has issues with chronic mild swelling in both legs especially at the end of the day but denies any pain or rash, states currently swelling is doing better, on Aldactone and Lasix as needed. Denies any current cough, dyspnea, orthopnea or PND. No hemoptysis or chest pain. No nausea, vomiting or diarrhea. No peripheral neuropathy. No new skin rash. No pain issues, 0/10.   REVIEW OF SYSTEMS:   ROS As in HPI above. In addition, no fever, chills. No new headaches or focal weakness.  No new mood disturbances. No  sore throat, cough, shortness of breath, sputum, hemoptysis or chest pain. No dizziness or palpitation. No abdominal pain, constipation, diarrhea, dysuria or hematuria. No new skin rash or bleeding symptoms. No new paresthesias in extremities. No polyuria polydipsia . PS ECOG 0.  PAST MEDICAL HISTORY: Reviewed Past Medical History   Diagnosis Date  . Lung cancer   . Allergy   . Mild valvular heart disease Nov. 2014    per 2 D Echocardiogram done for persistent leg edema, monitored by cardiology  Denies history of diabetes, hypertension, heart disease, stroke, liver disease or kidney problems.  PAST SURGICAL HISTORY:Reviewed Past Surgical History  Procedure Laterality Date  . Elbow surgery    . Left anterior thoracotomy with biopsy  March 2012  . Portacath placement  March 2012    FAMILY HISTORY:Reviewed Family History  Problem Relation Age of Onset  . Leukemia Father     SOCIAL HISTORY: Reviewed History  Substance Use Topics  . Smoking status: Former Smoker    Types: Cigarettes  . Smokeless tobacco: Not on file  . Alcohol Use: 0.0 oz/week    0 Standard drinks or equivalent per week     Comment: "social drinker"   Currently nonsmoker (minimal smoking history many years ago), denies second-hand smoke exposure. Social alcohol. Works as a Musician.  Allergies  Allergen Reactions  . Penicillins     Reaction and severity unknown    Current Outpatient Prescriptions  Medication Sig Dispense Refill  . furosemide (LASIX) 20 MG tablet Take 20 mg by mouth daily. One tablet daily as needed for swelling    . meclizine (ANTIVERT) 25 MG tablet Take 1 tablet (25 mg total) by mouth 3 (three) times daily as needed for dizziness. 60 tablet 0  . meloxicam (MOBIC) 7.5 MG tablet Take 7.5 mg by mouth daily.    . Multiple Vitamin (MULTIVITAMIN) capsule Take 1 capsule by mouth daily.    . ondansetron (ZOFRAN-ODT) 4 MG disintegrating  tablet Take 4 mg by mouth every 4 (four) hours as needed for nausea or vomiting.    . senna (SENOKOT) 8.6 MG tablet Take 1 tablet by mouth daily. Take as needed around time of chemotherapy    . spironolactone (ALDACTONE) 25 MG tablet Take 25 mg by mouth daily.    . vitamin B-12 (CYANOCOBALAMIN) 1000 MCG tablet Take 1,000 mcg by mouth daily.    . folic acid (FOLVITE) 1 MG tablet  Take 1 tablet (1 mg total) by mouth daily. 30 tablet 6   No current facility-administered medications for this visit.   Facility-Administered Medications Ordered in Other Visits  Medication Dose Route Frequency Provider Last Rate Last Dose  . sodium chloride 0.9 % injection 10 mL  10 mL Intracatheter PRN [name], MD   10 mL at 05/27/15 1543  . sodium chloride 0.9 % injection 10 mL  10 mL Intracatheter PRN [name], MD   10 mL at 06/17/15 1510    OBJECTIVE: Filed Vitals:   06/17/15 1410  BP: 115/69  Pulse: 69  Temp: 97.3 F (36.3 C)  Resp: 18     Body mass index is 28.47 kg/(m^2).    ECOG FS:0 - Asymptomatic  GENERAL: Alert and oriented and in no acute d. No icterus or pallor HEENT: EOMs intact. Oral exam negative for thrush. No cervical lymphadenopathy. CVS: S1S2, regular LUNGS: Bilaterally clear to auscultation, no  crepitations or rhonchi. ABDOMEN: Soft, nontender. No hepatomegaly clinically.  NEURO: grossly nonfocal, cranial nerves are intact. Gait unremarkable. EXTREMITIES: No pedal edema.   LAB RESULTS: WBC 5.6, ANC 3.4, hemoglobin 13.9, platelets 140.    Component Value Date/Time   NA 138 12/10/2014 1341   K 3.6 12/10/2014 1341   CL 102 12/10/2014 1341   CO2 30 12/10/2014 1341   GLUCOSE 160* 12/10/2014 1341   BUN 21* 12/10/2014 1341   CREATININE 1.25* 06/17/2015 1052   CREATININE 1.10 03/25/2015 1453   CALCIUM 8.4* 12/10/2014 1341   PROT 6.8 06/17/2015 1052   PROT 6.6 12/10/2014 1341   ALBUMIN 4.1 06/17/2015 1052   ALBUMIN 3.4 12/10/2014 1341   AST 25 06/17/2015 1052   AST 17 12/10/2014 1341   ALT 19 06/17/2015 1052   ALT 23 12/10/2014 1341   ALKPHOS 45 06/17/2015 1052   ALKPHOS 50 12/10/2014 1341   BILITOT 0.9 06/17/2015 1052   BILITOT 0.5 12/10/2014 1341   GFRNONAA >60 06/17/2015 1052   GFRNONAA >60 03/25/2015 1453   GFRAA >60 06/17/2015 1052   GFRAA >60 03/25/2015 1453   Lab Results  Component Value Date   WBC 5.6 06/17/2015    NEUTROABS 3.4 06/17/2015   HGB 13.9 06/17/2015   HCT 41.8 06/17/2015   MCV 93.6 06/17/2015   PLT 140* 06/17/2015   STUDIES: 08/23/14 - CT scan of chest and abdomen with contrast. IMPRESSION: 1. No acute findings and no evidence for metastatic disease within the chest abdomen or pelvis.  12/06/14 - CT scan of chest and abdomen with contrast. IMPRESSION: 1. No acute findings and no evidence for metastatic disease within the chest or abdomen. 2. 3 mm right middle lobe nodule is unchanged from previous exam. Attention on followup imaging recommended. 3. Aneurysmal dilatation of the ascending aorta. Recommend annual imaging followup by CTA or MRA.  03/21/15 - CT scan. IMPRESSION:  No CT evidence for intra-abdominal or thoracic recurrence or metastatic disease. Stable 3 mm right middle lobe pulmonary nodule. No acute abnormality within the chest or abdomen. No significant change  in aneurysmal dilatation of the ascending aorta allowing for differences in technique. Recommend annual imaging followup by CTA or MRA.   STAGING: Stage 4 lung cancer  Staging form: Lung, AJCC 7th Edition     Clinical stage from 05/24/2015: Stage IV (TX, NX, [name], MD on 05/24/2015.   ASSESSMENT / PLAN:   1. Stage IV poorly differentiated non small cell left lung cancer diagnosed by mediastinal mass biopsy on 02/07/11 (adenocarcinoma, acinar pattern). Negative for EGFR/Kras/ALK mutations. CT scan on April 18 negative for any recurrent/progressive metastatic lung cancer  - have  reviewed labs from today and discussed with patient. He continues to do fairly steady without any new side effects from ongoing chemotherapy. He wants to continue on current treatment. Performance status is good, PS ECOG 0. Lower extremity edema is same (still grade 1), continue Aldactone alongwith Lasix as needed. Last 2D-ECHOcardiogram Nov 2014 showed mild valvular abnormalities (done for persistent leg edema), no cardiac  symptoms. Last 24-hour urine did not show significant proteinuria - reviewed labs from today and d/w patient. Blood counts are adequate for treatment, will proceed with next cycle of maintenance chemotherapy per protocol guidelines with Alimta 500 mg/m2 IV. Continue to monitor weekly labs, next MD followup in 3 weeks with labs and plan continued treatment.  2. Hyperglycemia - per patient blood sugar at home continues to do well. He is following diabetic diet. Will monitor. 3. Lower extremity edema - grade 1, chronic, mild. Continue on current dose of diuretics and monitor.  4. Thrombocytopenia - grade 1, no bleeding issues. Platelet count  just below normal range Continue to monitor.  5. Nutrition - eating well Recent mild weight loss is intentional given increased workouts and playing tennis. In between visits, he was advised to call or come to ER in case of fevers, bleeding, acute sickness or new symptoms. He is agreeable to above plan.    [name], MD   06/22/2015 6:42 PM     -

## 2015-07-04 ENCOUNTER — Ambulatory Visit
Admission: RE | Admit: 2015-07-04 | Discharge: 2015-07-04 | Disposition: A | Discharge status: Home / Self Care | Payer: BLUE CROSS/BLUE SHIELD | Source: Ambulatory Visit | Attending: Internal Medicine | Admitting: Internal Medicine

## 2015-07-04 DIAGNOSIS — R911 Solitary pulmonary nodule: Secondary | ICD-10-CM | POA: Diagnosis not present

## 2015-07-04 DIAGNOSIS — C3492 Malignant neoplasm of unspecified part of left bronchus or lung: Secondary | ICD-10-CM | POA: Diagnosis not present

## 2015-07-04 MED ORDER — IOHEXOL 300 MG/ML  SOLN
100.0000 mL | Freq: Once | INTRAMUSCULAR | Status: AC | PRN
  Administered 2015-07-04: 100 mL via INTRAVENOUS

## 2015-07-08 ENCOUNTER — Inpatient Hospital Stay (HOSPITAL_BASED_OUTPATIENT_CLINIC_OR_DEPARTMENT_OTHER): Payer: BLUE CROSS/BLUE SHIELD | Admitting: Internal Medicine

## 2015-07-08 ENCOUNTER — Inpatient Hospital Stay: Payer: BLUE CROSS/BLUE SHIELD

## 2015-07-08 ENCOUNTER — Encounter: Payer: Self-pay | Admitting: Internal Medicine

## 2015-07-08 ENCOUNTER — Encounter: Payer: Self-pay | Admitting: *Deleted

## 2015-07-08 ENCOUNTER — Inpatient Hospital Stay: Payer: BLUE CROSS/BLUE SHIELD | Attending: Internal Medicine

## 2015-07-08 VITALS — BP 105/72 | HR 78 | Temp 98.0°F | Resp 18

## 2015-07-08 VITALS — BP 103/71 | HR 76 | Temp 98.2°F | Ht 70.0 in | Wt 200.0 lb

## 2015-07-08 DIAGNOSIS — R600 Localized edema: Secondary | ICD-10-CM | POA: Diagnosis not present

## 2015-07-08 DIAGNOSIS — Z87891 Personal history of nicotine dependence: Secondary | ICD-10-CM | POA: Diagnosis not present

## 2015-07-08 DIAGNOSIS — D696 Thrombocytopenia, unspecified: Secondary | ICD-10-CM | POA: Diagnosis not present

## 2015-07-08 DIAGNOSIS — C3490 Malignant neoplasm of unspecified part of unspecified bronchus or lung: Secondary | ICD-10-CM

## 2015-07-08 DIAGNOSIS — R739 Hyperglycemia, unspecified: Secondary | ICD-10-CM | POA: Diagnosis not present

## 2015-07-08 DIAGNOSIS — C3412 Malignant neoplasm of upper lobe, left bronchus or lung: Secondary | ICD-10-CM

## 2015-07-08 DIAGNOSIS — Z79899 Other long term (current) drug therapy: Secondary | ICD-10-CM | POA: Insufficient documentation

## 2015-07-08 DIAGNOSIS — C3492 Malignant neoplasm of unspecified part of left bronchus or lung: Secondary | ICD-10-CM

## 2015-07-08 LAB — CBC WITH DIFFERENTIAL/PLATELET
Basophils Absolute: 0 10*3/uL (ref 0–0.1)
Basophils Relative: 1 %
EOS PCT: 3 %
Eosinophils Absolute: 0.2 10*3/uL (ref 0–0.7)
HEMATOCRIT: 40.9 % (ref 40.0–52.0)
Hemoglobin: 13.9 g/dL (ref 13.0–18.0)
LYMPHS ABS: 1.8 10*3/uL (ref 1.0–3.6)
Lymphocytes Relative: 30 %
MCH: 31.5 pg (ref 26.0–34.0)
MCHC: 34 g/dL (ref 32.0–36.0)
MCV: 92.8 fL (ref 80.0–100.0)
MONO ABS: 0.6 10*3/uL (ref 0.2–1.0)
Monocytes Relative: 10 %
NEUTROS PCT: 56 %
Neutro Abs: 3.4 10*3/uL (ref 1.4–6.5)
PLATELETS: 140 10*3/uL — AB (ref 150–440)
RBC: 4.41 MIL/uL (ref 4.40–5.90)
RDW: 14.2 % (ref 11.5–14.5)
WBC: 5.9 10*3/uL (ref 3.8–10.6)

## 2015-07-08 LAB — CREATININE, SERUM
Creatinine, Ser: 1.26 mg/dL — ABNORMAL HIGH (ref 0.61–1.24)
GFR calc Af Amer: >60 mL/min (ref >60)

## 2015-07-08 MED ORDER — HEPARIN SOD (PORK) LOCK FLUSH 100 UNIT/ML IV SOLN
500.0000 [IU] | Freq: Once | INTRAVENOUS | Status: AC
  Administered 2015-07-08: 500 [IU] via INTRAVENOUS
  Filled 2015-07-08: qty 5

## 2015-07-08 MED ORDER — SODIUM CHLORIDE 0.9 % IV SOLN
1050.0000 mg | Freq: Once | INTRAVENOUS | Status: AC
  Administered 2015-07-08: 1050 mg via INTRAVENOUS
  Filled 2015-07-08: qty 42

## 2015-07-08 MED ORDER — SODIUM CHLORIDE 0.9 % IJ SOLN
10.0000 mL | INTRAMUSCULAR | Status: AC | PRN
  Administered 2015-07-08: 10 mL via INTRAVENOUS
  Filled 2015-07-08: qty 10

## 2015-07-08 MED ORDER — SODIUM CHLORIDE 0.9 % IV SOLN
Freq: Once | INTRAVENOUS | Status: AC
  Administered 2015-07-08: 15:00:00 via INTRAVENOUS
  Filled 2015-07-08: qty 1000

## 2015-07-08 MED ORDER — SODIUM CHLORIDE 0.9 % IV SOLN
Freq: Once | INTRAVENOUS | Status: AC
  Administered 2015-07-08: 15:00:00 via INTRAVENOUS
  Filled 2015-07-08: qty 4

## 2015-07-08 NOTE — Addendum Note (Signed)
Addended by: Daleen Bo on: 07/08/2015 04:21 PM   Modules accepted: Orders

## 2015-07-08 NOTE — Addendum Note (Signed)
Addended by: Daleen Bo on: 07/08/2015 02:54 PM   Modules accepted: Orders

## 2015-07-08 NOTE — Progress Notes (Signed)
Mitchell Mosley Protocol Research Encounter:  The patient arrives in clinic for Cycle 572 maintenance Pemetrexed.  He reports that he is feeling well and denies any new symptoms.  His review of systems is negative.  His Grade 1 ankle/pretibial intermittent edema continues.  He remains active with a PS of 0.  His labs show continued Grade 1 thrombocytopenia (platelets = 140) and a serum creatinine of 1.26.  These values are considered clinically insignificant by Dr. Ma Hillock.  He reports that he has two areas on the medial aspect of his feet that appear to be bruised. These areas are dry and not painful.  Dr. Ma Hillock describes these areas as hyperpigmented( Grade 1) and he suspects these are due to friction from the patient's footwear.  He will continue to monitor these areas and if need be he will order a dermatology consult.   [name], RN

## 2015-07-08 NOTE — Progress Notes (Signed)
Edema of lower ext. About the same. He has noticed a spot on both feet that is darkened color and does not hurt but looks like bruising.Marland Kitchen

## 2015-07-11 NOTE — Progress Notes (Signed)
[facility]  Telephone:(336) [phone] [phone]) [phone]     ID: Mitchell Mosley OB: Apr 26, 1961  MR#: [id number]  WYO#:[id number]  Patient Care Team: [name], MD as PCP - General (Family Medicine)  CHIEF COMPLAINT/DIAGNOSIS:  1. Stage IV poorly differentiated non small cell left lung cancer diagnosed by mediastinal mass biopsy on 02/07/11 (adenocarcinoma, acinar pattern). Negative for EGFR/Kras/ALK mutations. PET scan Feb 2012 showed abnormalities in neck and chest, chest wall, the body of T1, and in the left iliac bone suggestive of bony involvement. On [employer], completed Carboplatinum/Alimta x 4, now on maintenance Alimta since 06/01/11.  2. 2D-ECHOcardiogram Nov 2014 showed mild valvular abnormalities (done for persistent leg edema) - on monitoring by cardiology.    HISTORY OF PRESENT ILLNESS:  Patient returns for continued oncology followup and treatment planning for lung cancer as described above, he is on Alimta chemotherapy, he is enrolled on clinical trial. States that he is doing steady, denies any new complaints. States that he has noticed one or 2 more small hyperpigmented skin lesions that appeared over the medial side of the foot, denies any itching/discharge/blisters. 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 He has issues with chronic mild swelling in both legs especially at the end of the day but denies any pain, states currently swelling is doing better, on Aldactone and Lasix as needed. Denies any current cough, dyspnea, orthopnea or PND. No hemoptysis or chest pain. No nausea, vomiting or diarrhea. No peripheral neuropathy. No pain issues, 0/10.   REVIEW OF SYSTEMS:   ROS As in HPI above. In addition, no fever, chills. No new headaches or focal weakness.  No new mood disturbances. No  sore throat or dysphagia. Denies any cough, shortness of breath, sputum, hemoptysis or chest pain. No dizziness or  palpitation. No abdominal pain, constipation, diarrhea, dysuria or hematuria. No bleeding symptoms. No new paresthesias in extremities. No polyuria polydipsia . PS ECOG 0.  PAST MEDICAL HISTORY: Reviewed Past Medical History  Diagnosis Date  . Allergy   . Mild valvular heart disease Nov. 2014    per 2 D Echocardiogram done for persistent leg edema, monitored by cardiology  . Lung cancer   Denies history of diabetes, hypertension, heart disease, stroke, liver disease or kidney problems.  PAST SURGICAL HISTORY:Reviewed Past Surgical History  Procedure Laterality Date  . Elbow surgery    . Left anterior thoracotomy with biopsy  March 2012  . Portacath placement  March 2012    FAMILY HISTORY:Reviewed Family History  Problem Relation Age of Onset  . Leukemia Father     SOCIAL HISTORY: Reviewed History  Substance Use Topics  . Smoking status: Former Smoker    Types: Cigarettes  . Smokeless tobacco: Not on file  . Alcohol Use: 0.0 oz/week    0 Standard drinks or equivalent per week     Comment: "social drinker"   Currently nonsmoker (minimal smoking history many years ago), denies second-hand smoke exposure. Social alcohol. Works as a Musician.  Allergies  Allergen Reactions  . Penicillins     Reaction and severity unknown    Current Outpatient Prescriptions  Medication Sig Dispense Refill  . folic acid (FOLVITE) 1 MG tablet Take 1 tablet (1 mg total) by mouth daily. 30 tablet 6  . furosemide (LASIX) 20 MG tablet Take 20 mg by mouth daily. One tablet daily as needed for swelling    . meclizine (ANTIVERT) 25 MG tablet Take  1 tablet (25 mg total) by mouth 3 (three) times daily as needed for dizziness. 60 tablet 0  . meloxicam (MOBIC) 7.5 MG tablet Take 7.5 mg by mouth daily.    . Multiple Vitamin (MULTIVITAMIN) capsule Take 1 capsule by mouth daily.    . ondansetron (ZOFRAN-ODT) 4 MG disintegrating tablet Take 4 mg by mouth every 4 (four) hours as needed for nausea  or vomiting.    . senna (SENOKOT) 8.6 MG tablet Take 1 tablet by mouth daily. Take as needed around time of chemotherapy    . spironolactone (ALDACTONE) 25 MG tablet Take 25 mg by mouth daily.    . vitamin B-12 (CYANOCOBALAMIN) 1000 MCG tablet Take 1,000 mcg by mouth daily.     No current facility-administered medications for this visit.   Facility-Administered Medications Ordered in Other Visits  Medication Dose Route Frequency Provider Last Rate Last Dose  . sodium chloride 0.9 % injection 10 mL  10 mL Intracatheter PRN [name], MD   10 mL at 05/27/15 1543  . sodium chloride 0.9 % injection 10 mL  10 mL Intracatheter PRN [name], MD   10 mL at 06/17/15 1510  . sodium chloride 0.9 % injection 10 mL  10 mL Intravenous PRN [name], MD   10 mL at 07/08/15 1340    OBJECTIVE: Filed Vitals:   07/08/15 1406  BP: 103/71  Pulse: 76  Temp: 98.2 F (36.8 C)     Body mass index is 28.69 kg/(m^2).    ECOG FS:0 - Asymptomatic  GENERAL: Patient is alert and oriented and in no acute d. No icterus or pallor HEENT: EOMs intact. Oral exam negative for thrush. No cervical lymphadenopathy. CVS: S1S2, regular LUNGS: Bilaterally clear to auscultation, no  crepitations or rhonchi. ABDOMEN: Soft, nontender. No hepatomegaly clinically.  NEURO: grossly nonfocal, cranial nerves are intact. Gait unremarkable. EXTREMITIES: No pedal edema. Has 1-2 small brownish hyperpigmented flat skin lesions in the medial side off each foot, no pustules or blisters. No erythema.   LAB RESULTS: WBC 5.6, ANC 3.4, hemoglobin 13.9, platelets 140.    Component Value Date/Time   NA 138 12/10/2014 1341   K 3.6 12/10/2014 1341   CL 102 12/10/2014 1341   CO2 30 12/10/2014 1341   GLUCOSE 160* 12/10/2014 1341   BUN 21* 12/10/2014 1341   CREATININE 1.26* 07/08/2015 1337   CREATININE 1.10 03/25/2015 1453   CALCIUM 8.4* 12/10/2014 1341   PROT 6.8 06/17/2015 1052   PROT 6.6 12/10/2014 1341   ALBUMIN 4.1  06/17/2015 1052   ALBUMIN 3.4 12/10/2014 1341   AST 25 06/17/2015 1052   AST 17 12/10/2014 1341   ALT 19 06/17/2015 1052   ALT 23 12/10/2014 1341   ALKPHOS 45 06/17/2015 1052   ALKPHOS 50 12/10/2014 1341   BILITOT 0.9 06/17/2015 1052   BILITOT 0.5 12/10/2014 1341   GFRNONAA >60 07/08/2015 1337   GFRNONAA >60 03/25/2015 1453   GFRAA >60 07/08/2015 1337   GFRAA >60 03/25/2015 1453   Lab Results  Component Value Date   WBC 5.9 07/08/2015   NEUTROABS 3.4 07/08/2015   HGB 13.9 07/08/2015   HCT 40.9 07/08/2015   MCV 92.8 07/08/2015   PLT 140* 07/08/2015   STUDIES: 08/23/14 - CT scan of chest and abdomen with contrast. IMPRESSION: 1. No acute findings and no evidence for metastatic disease within the chest abdomen or pelvis.  12/06/14 - CT scan of chest and abdomen with contrast. IMPRESSION: 1. No acute findings and no  evidence for metastatic disease within the chest or abdomen. 2. 3 mm right middle lobe nodule is unchanged from previous exam. Attention on followup imaging recommended. 3. Aneurysmal dilatation of the ascending aorta. Recommend annual imaging followup by CTA or MRA.  03/21/15 - CT scan. IMPRESSION:  No CT evidence for intra-abdominal or thoracic recurrence or metastatic disease. Stable 3 mm right middle lobe pulmonary nodule. No acute abnormality within the chest or abdomen. No significant change in aneurysmal dilatation of the ascending aorta allowing for differences in technique. Recommend annual imaging followup by CTA or MRA.   07/04/15 - CT scan of the chest and abdomen with contrast. IMPRESSION: No CT evidence for intra-abdominal or thoracic recurrence or metastatic disease.  Stable 3 mm right middle lobe pulmonary nodule. No significant interval change aneurysmal dilatation of the ascending aorta. Recommend annual imaging followup by CTA or MRA. This recommendation follows 2010 ACCF/AHA/AATS/ACR/ASA/SCA/SCAI/SIR/STS/SVM Guidelines for the Diagnosis and Management of  Patients with Thoracic Aortic Disease. Circulation.2010; 121: W960-A540  STAGING: Stage 4 lung cancer  Staging form: Lung, AJCC 7th Edition     Clinical stage from 05/24/2015: Stage IV (TX, NX, [name], MD on 05/24/2015.   ASSESSMENT / PLAN:   1. Stage IV poorly differentiated non small cell left lung cancer diagnosed by mediastinal mass biopsy on 02/07/11 (adenocarcinoma, acinar pattern). Negative for EGFR/Kras/ALK mutations. CT scan on April 18 negative for any recurrent/progressive metastatic lung cancer. Regarding pedal edema, last 2D-ECHOcardiogram Nov 2014 showed mild valvular abnormalities (done for persistent leg edema), no cardiac symptoms. Last 24-hour urine did not show significant proteinuria - reviewed recent CT scan of the chest/abdomen from August 1, and labs from today and discussed with patient. Have explained that there is no radiological evidence to suggest recurrent/progression of lung cancer. Clinically doing steady without any new side effects from ongoing chemotherapy. He wants to continue on current treatment. Performance status is good, PS ECOG 0. Lower extremity edema is same (still grade 1), continue Aldactone alongwith Lasix as needed. Blood counts are adequate for treatment, will proceed with next cycle of maintenance chemotherapy per protocol guidelines with Alimta 500 mg/m2 IV. Continue to monitor weekly labs, next MD followup in 3 weeks with labs and plan continued treatment.  2. Hyperglycemia - per patient blood sugar at home continues to do well. He is following diabetic diet. Will monitor. 3. Lower extremity edema - grade 1, chronic, mild. Continue on current dose of diuretics and monitor.  4. Thrombocytopenia - grade 1, no bleeding issues. Platelet count  just below normal range, will monitor.  5. Nutrition - eating well. 6. Hyperpigmented small skin lesion medial side of both feet - ? Small bruising from friction by footwear vs other  etiology, if it worsens will get dermatology evaluation (grade 1). In between visits, he was advised to call or come to ER in case of fevers, bleeding, acute sickness or new symptoms. He is agreeable to above plan.    [name], MD   07/11/2015 9:22 AM     -

## 2015-07-19 ENCOUNTER — Other Ambulatory Visit: Payer: Self-pay | Admitting: *Deleted

## 2015-07-19 MED ORDER — FUROSEMIDE 20 MG PO TABS: 20.0000 mg | ORAL_TABLET | Freq: Every day | ORAL | Status: DC

## 2015-07-19 NOTE — Telephone Encounter (Signed)
Escribed

## 2015-07-22 ENCOUNTER — Other Ambulatory Visit: Payer: Self-pay | Admitting: *Deleted

## 2015-07-22 ENCOUNTER — Telehealth: Payer: Self-pay | Admitting: *Deleted

## 2015-07-22 DIAGNOSIS — C3492 Malignant neoplasm of unspecified part of left bronchus or lung: Secondary | ICD-10-CM

## 2015-07-22 NOTE — Telephone Encounter (Signed)
Research coordinator returned from lunch and found voicemail from patient Mitchell Mosley requesting that his appointment for 08/01/2015 be rescheduled to 07/29/2015.  I contacted Lattie Haw and appointment was rescheduled for 07/29/2015 at 1:15 pm for labs, see MD, and 1 hour infusion of maintenance Alimta. The patient's labwork was rescheduled in EPIC and I called and spoke with the patient confirming his rescheduled appointment.   [name], RN

## 2015-07-29 ENCOUNTER — Inpatient Hospital Stay: Payer: BLUE CROSS/BLUE SHIELD

## 2015-07-29 ENCOUNTER — Other Ambulatory Visit: Payer: Self-pay | Admitting: *Deleted

## 2015-07-29 ENCOUNTER — Encounter: Payer: Self-pay | Admitting: Internal Medicine

## 2015-07-29 ENCOUNTER — Inpatient Hospital Stay (HOSPITAL_BASED_OUTPATIENT_CLINIC_OR_DEPARTMENT_OTHER): Payer: BLUE CROSS/BLUE SHIELD | Admitting: Internal Medicine

## 2015-07-29 ENCOUNTER — Encounter: Payer: Self-pay | Admitting: *Deleted

## 2015-07-29 VITALS — BP 126/82 | HR 73 | Temp 96.3°F | Resp 18 | Ht 70.0 in | Wt 197.8 lb

## 2015-07-29 DIAGNOSIS — C3492 Malignant neoplasm of unspecified part of left bronchus or lung: Secondary | ICD-10-CM

## 2015-07-29 DIAGNOSIS — C3490 Malignant neoplasm of unspecified part of unspecified bronchus or lung: Secondary | ICD-10-CM

## 2015-07-29 DIAGNOSIS — D696 Thrombocytopenia, unspecified: Secondary | ICD-10-CM | POA: Diagnosis not present

## 2015-07-29 DIAGNOSIS — Z79899 Other long term (current) drug therapy: Secondary | ICD-10-CM

## 2015-07-29 DIAGNOSIS — R739 Hyperglycemia, unspecified: Secondary | ICD-10-CM | POA: Diagnosis not present

## 2015-07-29 DIAGNOSIS — C3412 Malignant neoplasm of upper lobe, left bronchus or lung: Secondary | ICD-10-CM

## 2015-07-29 DIAGNOSIS — R600 Localized edema: Secondary | ICD-10-CM | POA: Diagnosis not present

## 2015-07-29 LAB — CBC WITH DIFFERENTIAL/PLATELET
BASOS ABS: 0 10*3/uL (ref 0–0.1)
BASOS PCT: 1 %
Eosinophils Absolute: 0.1 10*3/uL (ref 0–0.7)
Eosinophils Relative: 1 %
HEMATOCRIT: 41.1 % (ref 40.0–52.0)
HEMOGLOBIN: 13.8 g/dL (ref 13.0–18.0)
LYMPHS PCT: 24 %
Lymphs Abs: 1.4 10*3/uL (ref 1.0–3.6)
MCH: 31.3 pg (ref 26.0–34.0)
MCHC: 33.7 g/dL (ref 32.0–36.0)
MCV: 92.8 fL (ref 80.0–100.0)
Monocytes Absolute: 0.5 10*3/uL (ref 0.2–1.0)
Monocytes Relative: 10 %
NEUTROS ABS: 3.6 10*3/uL (ref 1.4–6.5)
NEUTROS PCT: 64 %
Platelets: 155 10*3/uL (ref 150–440)
RBC: 4.43 MIL/uL (ref 4.40–5.90)
RDW: 13.8 % (ref 11.5–14.5)
WBC: 5.6 10*3/uL (ref 3.8–10.6)

## 2015-07-29 LAB — CREATININE, SERUM
Creatinine, Ser: 1.1 mg/dL (ref 0.61–1.24)
GFR calc non Af Amer: >60 mL/min (ref >60)

## 2015-07-29 MED ORDER — INV-PEMETREXED CHEMO INJECTION 500 MG LILLY S130
1050.0000 mg | Freq: Once | INTRAVENOUS | Status: AC
  Administered 2015-07-29: 1050 mg via INTRAVENOUS
  Filled 2015-07-29: qty 42

## 2015-07-29 MED ORDER — SODIUM CHLORIDE 0.9 % IV SOLN
Freq: Once | INTRAVENOUS | Status: AC
  Administered 2015-07-29: 15:00:00 via INTRAVENOUS
  Filled 2015-07-29: qty 1000

## 2015-07-29 MED ORDER — CYANOCOBALAMIN 1000 MCG/ML IJ SOLN
1000.0000 ug | Freq: Once | INTRAMUSCULAR | Status: AC
  Administered 2015-07-29: 1000 ug via INTRAMUSCULAR
  Filled 2015-07-29: qty 1

## 2015-07-29 MED ORDER — SODIUM CHLORIDE 0.9 % IJ SOLN
10.0000 mL | INTRAMUSCULAR | Status: DC | PRN
  Filled 2015-07-29: qty 10

## 2015-07-29 MED ORDER — SODIUM CHLORIDE 0.9 % IV SOLN
Freq: Once | INTRAVENOUS | Status: AC
  Administered 2015-07-29: 15:00:00 via INTRAVENOUS
  Filled 2015-07-29: qty 4

## 2015-07-29 MED ORDER — HEPARIN SOD (PORK) LOCK FLUSH 100 UNIT/ML IV SOLN
500.0000 [IU] | Freq: Once | INTRAVENOUS | Status: AC
  Administered 2015-07-29: 500 [IU] via INTRAVENOUS
  Filled 2015-07-29: qty 5

## 2015-07-29 NOTE — Progress Notes (Signed)
Mitchell Mosley Research Encounter:  Patient arrived in clinic for Cycle 573 maintenance Alimta. He reports that he is felling well and has no new symptoms. His review of systems is negative. His weight is stable.  His labs are within normal limits. His previous grade 1 thrombocytopenia is resolved today.  The intermittent pretibial/ankle edema ( Grade 1) continues.  The hyperpigmented areas( Grade 1) of his insteps bilaterally is unchanged. He remains active with a performance status =0. His B 12 injection is due today. He has been examined by Dr. Ma Hillock and is cleared for today's treatment.    Mitchell Samples, RN

## 2015-08-01 ENCOUNTER — Other Ambulatory Visit: Payer: BLUE CROSS/BLUE SHIELD

## 2015-08-01 ENCOUNTER — Ambulatory Visit: Payer: BLUE CROSS/BLUE SHIELD

## 2015-08-01 ENCOUNTER — Ambulatory Visit: Payer: BLUE CROSS/BLUE SHIELD | Admitting: Internal Medicine

## 2015-08-01 ENCOUNTER — Other Ambulatory Visit: Payer: Self-pay | Admitting: *Deleted

## 2015-08-15 NOTE — Progress Notes (Signed)
[facility]  Telephone:(336) [phone] Fax:(336) [phone]     ID: Mitchell Mosley OB: Dec 04, 1960  MR#: [id number]  QMG#:[id number]  Patient Care Team: [name], MD as PCP - General (Family Medicine)  CHIEF COMPLAINT/DIAGNOSIS:  1. Stage IV poorly differentiated non small cell left lung cancer diagnosed by mediastinal mass biopsy on 02/07/11 (adenocarcinoma, acinar pattern). Negative for EGFR/Kras/ALK mutations. PET scan Feb 2012 showed abnormalities in neck and chest, chest wall, the body of T1, and in the left iliac bone suggestive of bony involvement. On [employer], completed Carboplatinum/Alimta x 4, now on maintenance Alimta since 06/01/11.  2. 2D-ECHOcardiogram Nov 2014 showed mild valvular abnormalities (done for persistent leg edema) - on monitoring by cardiology.    HISTORY OF PRESENT ILLNESS:  Patient returns for oncology followup and plan next dose of Alimta chemotherapy, he is enrolled on clinical trial. He denies new complaints. States he still plays tennis regularly. He has issues with chronic mild swelling in both legs especially at the end of the day but denies any pain or rash, states currently swelling is doing better, on Aldactone and Lasix as needed. Denies any current cough, dyspnea, orthopnea or PND. No hemoptysis or chest pain. No nausea, vomiting or diarrhea. No peripheral neuropathy. No new skin rash. No pain issues.   REVIEW OF SYSTEMS:   ROS As in HPI above. In addition, no fevers. No new headaches or focal weakness.  No sore throat, cough, shortness of breath, sputum, hemoptysis or chest pain. No dizziness or palpitation. No abdominal pain, constipation, diarrhea, dysuria or hematuria. No new skin rash or bleeding symptoms. No new paresthesias in extremities. No polyuria polydipsia . PS ECOG 0.  PAST MEDICAL HISTORY: Reviewed Past Medical History  Diagnosis Date  . Allergy   . Mild valvular heart disease Nov. 2014   per 2 D Echocardiogram done for persistent leg edema, monitored by cardiology  . Lung cancer   Denies history of diabetes, hypertension, heart disease, stroke, liver disease or kidney problems.  PAST SURGICAL HISTORY:Reviewed Past Surgical History  Procedure Laterality Date  . Elbow surgery    . Left anterior thoracotomy with biopsy  March 2012  . Portacath placement  March 2012    FAMILY HISTORY:Reviewed Family History  Problem Relation Age of Onset  . Leukemia Father     SOCIAL HISTORY: Reviewed Social History  Substance Use Topics  . Smoking status: Former Smoker    Types: Cigarettes  . Smokeless tobacco: Not on file  . Alcohol Use: 0.0 oz/week    0 Standard drinks or equivalent per week     Comment: "social drinker"   Currently nonsmoker (minimal smoking history many years ago), denies second-hand smoke exposure. Social alcohol. Works as a Musician.  Allergies  Allergen Reactions  . Penicillins     Reaction and severity unknown    Current Outpatient Prescriptions  Medication Sig Dispense Refill  . folic acid (FOLVITE) 1 MG tablet Take 1 tablet (1 mg total) by mouth daily. 30 tablet 6  . furosemide (LASIX) 20 MG tablet Take 1 tablet (20 mg total) by mouth daily. One tablet daily as needed for swelling 30 tablet 2  . meclizine (ANTIVERT) 25 MG tablet Take 1 tablet (25 mg total) by mouth 3 (three) times daily as needed for dizziness. 60 tablet 0  . meloxicam (MOBIC) 7.5 MG tablet Take 7.5 mg by mouth daily.    . Multiple Vitamin (MULTIVITAMIN) capsule Take 1 capsule by mouth  daily.    . ondansetron (ZOFRAN-ODT) 4 MG disintegrating tablet Take 4 mg by mouth every 4 (four) hours as needed for nausea or vomiting.    . senna (SENOKOT) 8.6 MG tablet Take 1 tablet by mouth daily. Take as needed around time of chemotherapy    . spironolactone (ALDACTONE) 25 MG tablet Take 25 mg by mouth daily.    . vitamin B-12 (CYANOCOBALAMIN) 1000 MCG tablet Take 1,000 mcg by mouth  daily.     No current facility-administered medications for this visit.   Facility-Administered Medications Ordered in Other Visits  Medication Dose Route Frequency Provider Last Rate Last Dose  . sodium chloride 0.9 % injection 10 mL  10 mL Intracatheter PRN [name], MD   10 mL at 05/27/15 1543  . sodium chloride 0.9 % injection 10 mL  10 mL Intracatheter PRN [name], MD   10 mL at 06/17/15 1510  . sodium chloride 0.9 % injection 10 mL  10 mL Intravenous PRN [name], MD   10 mL at 07/08/15 1340    OBJECTIVE: Filed Vitals:   07/29/15 1359  BP: 126/82  Pulse: 73  Temp: 96.3 F (35.7 C)  Resp: 18     Body mass index is 28.37 kg/(m^2).    ECOG FS:0 - Asymptomatic  GENERAL: Alert and oriented and in no acute distress. No icterus. HEENT: EOMs intact. Oral exam negative for thrush. No cervical lymphadenopathy. CVS: S1S2, regular LUNGS: Bilaterally clear to auscultation, no  crepitations or rhonchi. ABDOMEN: Soft, nontender. No hepatomegaly clinically.  NEURO: grossly nonfocal, cranial nerves are intact. Gait unremarkable. EXTREMITIES: b/l trace pedal edema.   LAB RESULTS: WBC 5.6, ANC 3.4, hemoglobin 13.9, platelets 140.    Component Value Date/Time   NA 138 12/10/2014 1341   K 3.6 12/10/2014 1341   CL 102 12/10/2014 1341   CO2 30 12/10/2014 1341   GLUCOSE 160* 12/10/2014 1341   BUN 21* 12/10/2014 1341   CREATININE 1.10 07/29/2015 1339   CREATININE 1.10 03/25/2015 1453   CALCIUM 8.4* 12/10/2014 1341   PROT 6.8 06/17/2015 1052   PROT 6.6 12/10/2014 1341   ALBUMIN 4.1 06/17/2015 1052   ALBUMIN 3.4 12/10/2014 1341   AST 25 06/17/2015 1052   AST 17 12/10/2014 1341   ALT 19 06/17/2015 1052   ALT 23 12/10/2014 1341   ALKPHOS 45 06/17/2015 1052   ALKPHOS 50 12/10/2014 1341   BILITOT 0.9 06/17/2015 1052   BILITOT 0.5 12/10/2014 1341   GFRNONAA >60 07/29/2015 1339   GFRNONAA >60 03/25/2015 1453   GFRNONAA >60 12/31/2014 1346   GFRAA >60 07/29/2015 1339    GFRAA >60 03/25/2015 1453   GFRAA >60 12/31/2014 1346   Lab Results  Component Value Date   WBC 5.6 07/29/2015   NEUTROABS 3.6 07/29/2015   HGB 13.8 07/29/2015   HCT 41.1 07/29/2015   MCV 92.8 07/29/2015   PLT 155 07/29/2015   STUDIES: 08/23/14 - CT scan of chest and abdomen with contrast. IMPRESSION: 1. No acute findings and no evidence for metastatic disease within the chest abdomen or pelvis.  12/06/14 - CT scan of chest and abdomen with contrast. IMPRESSION: 1. No acute findings and no evidence for metastatic disease within the chest or abdomen. 2. 3 mm right middle lobe nodule is unchanged from previous exam. Attention on followup imaging recommended. 3. Aneurysmal dilatation of the ascending aorta. Recommend annual imaging followup by CTA or MRA.  03/21/15 - CT scan. IMPRESSION:  No CT evidence for intra-abdominal  or thoracic recurrence or metastatic disease. Stable 3 mm right middle lobe pulmonary nodule. No acute abnormality within the chest or abdomen. No significant change in aneurysmal dilatation of the ascending aorta allowing for differences in technique. Recommend annual imaging followup by CTA or MRA.   07/04/15 - CT scan of chest/abdomen -  IMPRESSION:  No CT evidence for intra-abdominal or thoracic recurrence or metastatic disease. Stable 3 mm right middle lobe pulmonary nodule. No significant interval change aneurysmal dilatation of the ascending aorta. Recommend annual imaging followup by CTA or MRA. This recommendation follows 2010 ACCF/AHA/AATS/ACR/ASA/SCA/SCAI/SIR/STS/SVM Guidelines for the Diagnosis and Management of Patients with Thoracic Aortic Disease. Circulation.2010; 121: M076-K088.   ASSESSMENT / PLAN:   1. Stage IV poorly differentiated non small cell left lung cancer diagnosed by mediastinal mass biopsy on 02/07/11 (adenocarcinoma, acinar pattern). Negative for EGFR/Kras/ALK mutations. CT scan on April 18 negative for any recurrent/progressive metastatic lung cancer   - have  reviewed labs from today and discussed with patient. He continues to do fairly steady without any new side effects from ongoing chemotherapy. He wants to continue on current treatment. Performance status is good, PS ECOG 0. Lower extremity edema is same (still grade 1), continue Aldactone alongwith Lasix as needed. Last 2D-ECHOcardiogram Nov 2014 showed mild valvular abnormalities (done for persistent leg edema), no cardiac symptoms. Last 24-hour urine did not show significant proteinuria - reviewed labs from today and recent CT scan from August 1 and d/w patient. Blood counts are adequate for treatment, will proceed with next cycle of maintenance chemotherapy per protocol guidelines with Alimta 500 mg/m2 IV. Continue to monitor weekly labs, next MD followup in 3 weeks with labs and plan continued treatment.  2. Hyperglycemia - per patient blood sugar at home continues to do well. He is following diabetic diet. Will monitor. 3. Lower extremity edema - grade 1, chronic, mild. Continue on current dose of diuretics and monitor.  4. Thrombocytopenia - grade 1, no bleeding issues. Platelet count  In low normal range Continue to monitor.  5. Nutrition - eating well Recent mild weight loss is intentional given increased workouts and playing tennis. In between visits, he was advised to call or come to ER in case of fevers, bleeding, acute sickness or new symptoms. He is agreeable to above plan.    [name], MD   08/15/2015 11:59 PM     -

## 2015-08-18 ENCOUNTER — Other Ambulatory Visit: Payer: Self-pay | Admitting: *Deleted

## 2015-08-18 DIAGNOSIS — C3490 Malignant neoplasm of unspecified part of unspecified bronchus or lung: Secondary | ICD-10-CM

## 2015-08-19 ENCOUNTER — Inpatient Hospital Stay (HOSPITAL_BASED_OUTPATIENT_CLINIC_OR_DEPARTMENT_OTHER): Payer: BLUE CROSS/BLUE SHIELD | Admitting: Internal Medicine

## 2015-08-19 ENCOUNTER — Inpatient Hospital Stay: Payer: BLUE CROSS/BLUE SHIELD

## 2015-08-19 ENCOUNTER — Other Ambulatory Visit: Payer: Self-pay | Admitting: *Deleted

## 2015-08-19 ENCOUNTER — Inpatient Hospital Stay: Payer: BLUE CROSS/BLUE SHIELD | Attending: Internal Medicine

## 2015-08-19 ENCOUNTER — Encounter: Payer: Self-pay | Admitting: *Deleted

## 2015-08-19 VITALS — BP 124/84 | HR 98 | Temp 96.9°F | Resp 18 | Ht 70.0 in | Wt 193.8 lb

## 2015-08-19 DIAGNOSIS — R600 Localized edema: Secondary | ICD-10-CM | POA: Insufficient documentation

## 2015-08-19 DIAGNOSIS — Z23 Encounter for immunization: Secondary | ICD-10-CM | POA: Diagnosis not present

## 2015-08-19 DIAGNOSIS — C3492 Malignant neoplasm of unspecified part of left bronchus or lung: Secondary | ICD-10-CM

## 2015-08-19 DIAGNOSIS — Z87891 Personal history of nicotine dependence: Secondary | ICD-10-CM | POA: Diagnosis not present

## 2015-08-19 DIAGNOSIS — R739 Hyperglycemia, unspecified: Secondary | ICD-10-CM

## 2015-08-19 DIAGNOSIS — Z79899 Other long term (current) drug therapy: Secondary | ICD-10-CM | POA: Insufficient documentation

## 2015-08-19 DIAGNOSIS — D696 Thrombocytopenia, unspecified: Secondary | ICD-10-CM | POA: Insufficient documentation

## 2015-08-19 DIAGNOSIS — C3490 Malignant neoplasm of unspecified part of unspecified bronchus or lung: Secondary | ICD-10-CM

## 2015-08-19 LAB — CBC WITH DIFFERENTIAL/PLATELET
Basophils Absolute: 0 10*3/uL (ref 0–0.1)
Basophils Relative: 0 %
EOS PCT: 1 %
Eosinophils Absolute: 0.1 10*3/uL (ref 0–0.7)
HCT: 42.8 % (ref 40.0–52.0)
Hemoglobin: 14.4 g/dL (ref 13.0–18.0)
Lymphocytes Relative: 23 %
Lymphs Abs: 1.1 10*3/uL (ref 1.0–3.6)
MCH: 31.7 pg (ref 26.0–34.0)
MCHC: 33.6 g/dL (ref 32.0–36.0)
MCV: 94.3 fL (ref 80.0–100.0)
MONO ABS: 0.5 10*3/uL (ref 0.2–1.0)
Monocytes Relative: 10 %
Neutro Abs: 2.9 10*3/uL (ref 1.4–6.5)
Neutrophils Relative %: 66 %
PLATELETS: 155 10*3/uL (ref 150–440)
RBC: 4.54 MIL/uL (ref 4.40–5.90)
RDW: 13.7 % (ref 11.5–14.5)
WBC: 4.5 10*3/uL (ref 3.8–10.6)

## 2015-08-19 LAB — HEPATIC FUNCTION PANEL
ALK PHOS: 47 U/L (ref 38–126)
ALT: 18 U/L (ref 17–63)
AST: 28 U/L (ref 15–41)
Albumin: 4.3 g/dL (ref 3.5–5.0)
BILIRUBIN INDIRECT: 1.1 mg/dL — AB (ref 0.3–0.9)
Bilirubin, Direct: 0.2 mg/dL (ref 0.1–0.5)
TOTAL PROTEIN: 7.4 g/dL (ref 6.5–8.1)
Total Bilirubin: 1.3 mg/dL — ABNORMAL HIGH (ref 0.3–1.2)

## 2015-08-19 LAB — MAGNESIUM: Magnesium: 1.9 mg/dL (ref 1.7–2.4)

## 2015-08-19 LAB — PROTEIN / CREATININE RATIO, URINE
Creatinine, Urine: 13 mg/dL
Total Protein, Urine: <6 mg/dL

## 2015-08-19 LAB — CREATININE, SERUM
Creatinine, Ser: 1.08 mg/dL (ref 0.61–1.24)
GFR calc Af Amer: >60 mL/min (ref >60)
GFR calc non Af Amer: >60 mL/min (ref >60)

## 2015-08-19 MED ORDER — HEPARIN SOD (PORK) LOCK FLUSH 100 UNIT/ML IV SOLN
INTRAVENOUS | Status: AC
  Filled 2015-08-19: qty 5

## 2015-08-19 MED ORDER — INFLUENZA VAC SPLIT QUAD 0.5 ML IM SUSY
0.5000 mL | PREFILLED_SYRINGE | Freq: Once | INTRAMUSCULAR | Status: AC
  Administered 2015-08-19: 0.5 mL via INTRAMUSCULAR
  Filled 2015-08-19: qty 0.5

## 2015-08-19 MED ORDER — HEPARIN SOD (PORK) LOCK FLUSH 100 UNIT/ML IV SOLN
500.0000 [IU] | Freq: Once | INTRAVENOUS | Status: AC | PRN
  Administered 2015-08-19: 500 [IU]

## 2015-08-19 MED ORDER — SODIUM CHLORIDE 0.9 % IJ SOLN
10.0000 mL | INTRAMUSCULAR | Status: DC | PRN
  Administered 2015-08-19: 10 mL
  Filled 2015-08-19: qty 10

## 2015-08-19 MED ORDER — SODIUM CHLORIDE 0.9 % IV SOLN
Freq: Once | INTRAVENOUS | Status: AC
  Administered 2015-08-19: 15:00:00 via INTRAVENOUS
  Filled 2015-08-19: qty 4

## 2015-08-19 MED ORDER — SODIUM CHLORIDE 0.9 % IV SOLN
1050.0000 mg | Freq: Once | INTRAVENOUS | Status: AC
  Administered 2015-08-19: 1050 mg via INTRAVENOUS
  Filled 2015-08-19: qty 42

## 2015-08-19 MED ORDER — SODIUM CHLORIDE 0.9 % IV SOLN
Freq: Once | INTRAVENOUS | Status: AC
  Administered 2015-08-19: 15:00:00 via INTRAVENOUS
  Filled 2015-08-19: qty 1000

## 2015-08-19 MED ORDER — CYANOCOBALAMIN 1000 MCG/ML IJ SOLN: 1000.0000 ug | Freq: Once | INTRAMUSCULAR | Status: DC

## 2015-08-19 NOTE — Progress Notes (Signed)
Mr. Smithey is here for Cycle 74 per protocol [facility] S130 with maintenance Alimta.   Pt reports feeling well, PS 0.  Continues to have Grade 1 ankle edema and Grade 1 hyperpigmentation skin changes instep of both feet.  He reports slight intermittent itching medial aspect of right upper arm.  Michela Pitcher he has had this in the past.  Medication list updated per Judeen Hammans and added fish oil and glucosamine, which he has been taking previously.  Labs are all acceptable to treat.  Total bili is now 1.3 (Grade 1), up from .9  on 06/17/15.  Dr. Ma Hillock aware and wants CMP repeated with next cycle and he will order an abdominal ultrasound for next week. Current weight is 87.9 kg.  If weight drops to 85.05 kg, Alimta dose to be recalculated for next cycle.

## 2015-08-19 NOTE — Progress Notes (Signed)
Pt here today and he is doing well.  He still has swelling of ext. And using fluid pills. He still has bruising like areas on feet and have not changed. Has the spot on the right upper arm inner part of arm that itches and scratches it and he puts cortisone cream on.

## 2015-08-22 ENCOUNTER — Other Ambulatory Visit: Payer: Self-pay | Admitting: *Deleted

## 2015-08-22 MED ORDER — SPIRONOLACTONE 25 MG PO TABS: 25.0000 mg | ORAL_TABLET | Freq: Every day | ORAL | Status: DC

## 2015-08-24 ENCOUNTER — Ambulatory Visit
Admission: RE | Admit: 2015-08-24 | Discharge: 2015-08-24 | Disposition: A | Discharge status: Home / Self Care | Payer: BLUE CROSS/BLUE SHIELD | Source: Ambulatory Visit | Attending: Internal Medicine | Admitting: Internal Medicine

## 2015-08-24 ENCOUNTER — Ambulatory Visit: Payer: BLUE CROSS/BLUE SHIELD

## 2015-08-24 DIAGNOSIS — C3490 Malignant neoplasm of unspecified part of unspecified bronchus or lung: Secondary | ICD-10-CM | POA: Diagnosis not present

## 2015-08-24 DIAGNOSIS — C3492 Malignant neoplasm of unspecified part of left bronchus or lung: Secondary | ICD-10-CM

## 2015-08-24 DIAGNOSIS — Z23 Encounter for immunization: Secondary | ICD-10-CM

## 2015-09-08 NOTE — Progress Notes (Addendum)
[place]  [phone] [phone]     ID: Mitchell Mosley OB: 07-22-1961  MR#: [id number]  GGE#:[id number]  Patient Care Team: [name], MD as PCP - General (Family Medicine)  CHIEF COMPLAINT/DIAGNOSIS:  1. Stage IV poorly differentiated non small cell left lung cancer diagnosed by mediastinal mass biopsy on 02/07/11 (adenocarcinoma, acinar pattern). Negative for EGFR/Kras/ALK mutations. PET scan Feb 2012 showed abnormalities in neck and chest, chest wall, the body of T1, and in the left iliac bone suggestive of bony involvement. On [employer], completed Carboplatinum/Alimta x 4, now on maintenance Alimta since 06/01/11.  2. 2D-ECHOcardiogram Nov 2014 showed mild valvular abnormalities (done for persistent leg edema) - also seen by cardiology.    HISTORY OF PRESENT ILLNESS:  Patient returns for oncology evaluation and plan next dose of Alimta chemotherapy, he is enrolled on clinical trial. States he is doing steady and denies new complaints, he still plays and coaches tennis regularly. States chronic mild swelling in both legs especially at the end of the day is steady and under control, takes Aldactone and Lasix as needed. Denies any current cough, dyspnea, orthopnea or PND. No hemoptysis or chest pain. No nausea, vomiting or diarrhea. No peripheral neuropathy. No new skin rash. No pain issues.   REVIEW OF SYSTEMS:   ROS As in HPI above. In addition, no fevers or chills. No new headaches or focal weakness. No sore throat or dysphagia. No dizziness or palpitation. No abdominal pain, constipation, diarrhea, dysuria or hematuria. No new skin rash or bleeding symptoms. No new paresthesias in extremities. No polyuria polydipsia . PS ECOG 0.  PAST MEDICAL HISTORY: Reviewed Past Medical History  Diagnosis Date  . Allergy   . Mild valvular heart disease Nov. 2014    per 2 D Echocardiogram done for persistent leg edema,  monitored by cardiology  . Lung cancer   Denies history of diabetes, hypertension, heart disease, stroke, liver disease or kidney problems.  PAST SURGICAL HISTORY:Reviewed Past Surgical History  Procedure Laterality Date  . Elbow surgery    . Left anterior thoracotomy with biopsy  March 2012  . Portacath placement  March 2012    FAMILY HISTORY:Reviewed Family History  Problem Relation Age of Onset  . Leukemia Father     SOCIAL HISTORY: Reviewed Social History  Substance Use Topics  . Smoking status: Former Smoker    Types: Cigarettes  . Smokeless tobacco: Not on file  . Alcohol Use: 0.0 oz/week    0 Standard drinks or equivalent per week     Comment: "social drinker"   Currently nonsmoker (minimal smoking history many years ago), denies second-hand smoke exposure. Social alcohol. Works as a Musician.  Allergies  Allergen Reactions  . Penicillins     Reaction and severity unknown    Current Outpatient Prescriptions  Medication Sig Dispense Refill  . folic acid (FOLVITE) 1 MG tablet Take 1 tablet (1 mg total) by mouth daily. 30 tablet 6  . furosemide (LASIX) 20 MG tablet Take 1 tablet (20 mg total) by mouth daily. One tablet daily as needed for swelling 30 tablet 2  . glucosamine-chondroitin 500-400 MG tablet Take 1 tablet by mouth daily.    . meclizine (ANTIVERT) 25 MG tablet Take 1 tablet (25 mg total) by mouth 3 (three) times daily as needed for dizziness. 60 tablet 0  . meloxicam (MOBIC) 7.5 MG tablet Take 7.5 mg by mouth daily.    . Multiple Vitamin (MULTIVITAMIN)  capsule Take 1 capsule by mouth daily.    . Omega-3 Fatty Acids (FISH OIL) 1000 MG CAPS Take 1 capsule by mouth daily.    . ondansetron (ZOFRAN-ODT) 4 MG disintegrating tablet Take 4 mg by mouth every 4 (four) hours as needed for nausea or vomiting.    . senna (SENOKOT) 8.6 MG tablet Take 1 tablet by mouth daily. Take as needed around time of chemotherapy    . vitamin B-12 (CYANOCOBALAMIN) 1000  MCG tablet Take 1,000 mcg by mouth daily.    Marland Kitchen spironolactone (ALDACTONE) 25 MG tablet Take 1 tablet (25 mg total) by mouth daily. 90 tablet 1   No current facility-administered medications for this visit.   Facility-Administered Medications Ordered in Other Visits  Medication Dose Route Frequency Provider Last Rate Last Dose  . sodium chloride 0.9 % injection 10 mL  10 mL Intracatheter PRN [name], MD   10 mL at 05/27/15 1543  . sodium chloride 0.9 % injection 10 mL  10 mL Intracatheter PRN [name], MD   10 mL at 06/17/15 1510  . sodium chloride 0.9 % injection 10 mL  10 mL Intravenous PRN [name], MD   10 mL at 07/08/15 1340    OBJECTIVE: Filed Vitals:   08/19/15 1353  BP: 124/84  Pulse: 98  Temp: 96.9 F (36.1 C)  Resp: 18     Body mass index is 27.81 kg/(m^2).    ECOG FS:0 - Asymptomatic  GENERAL: patient is alert and oriented and in no acute distress. No icterus or pallor. HEENT: EOMs intact. Oral exam negative for thrush. No cervical lymphadenopathy. CVS: S1S2, regular LUNGS: Bilaterally clear to auscultation, no crepitations or rhonchi. ABDOMEN: Soft, nontender. No hepatomegaly clinically.  NEURO: grossly nonfocal, cranial nerves are intact. Gait unremarkable. EXTREMITIES: b/l trace pedal edema, chronic.   LAB RESULTS:     Component Value Date/Time   NA 138 12/10/2014 1341   K 3.6 12/10/2014 1341   CL 102 12/10/2014 1341   CO2 30 12/10/2014 1341   GLUCOSE 160* 12/10/2014 1341   BUN 21* 12/10/2014 1341   CREATININE 1.08 08/19/2015 1002   CREATININE 1.10 03/25/2015 1453   CALCIUM 8.4* 12/10/2014 1341   PROT 7.4 08/19/2015 1002   PROT 6.6 12/10/2014 1341   ALBUMIN 4.3 08/19/2015 1002   ALBUMIN 3.4 12/10/2014 1341   AST 28 08/19/2015 1002   AST 17 12/10/2014 1341   ALT 18 08/19/2015 1002   ALT 23 12/10/2014 1341   ALKPHOS 47 08/19/2015 1002   ALKPHOS 50 12/10/2014 1341   BILITOT 1.3* 08/19/2015 1002   BILITOT 0.5 12/10/2014 1341   GFRNONAA  >60 08/19/2015 1002   GFRNONAA >60 03/25/2015 1453   GFRNONAA >60 12/31/2014 1346   GFRAA >60 08/19/2015 1002   GFRAA >60 03/25/2015 1453   GFRAA >60 12/31/2014 1346   Lab Results  Component Value Date   WBC 4.5 08/19/2015   NEUTROABS 2.9 08/19/2015   HGB 14.4 08/19/2015   HCT 42.8 08/19/2015   MCV 94.3 08/19/2015   PLT 155 08/19/2015  ANC 2.9.   STUDIES: 08/23/14 - CT scan of chest and abdomen with contrast. IMPRESSION: 1. No acute findings and no evidence for metastatic disease within the chest abdomen or pelvis.  12/06/14 - CT scan of chest and abdomen with contrast. IMPRESSION: 1. No acute findings and no evidence for metastatic disease within the chest or abdomen. 2. 3 mm right middle lobe nodule is unchanged from previous exam. Attention on followup imaging  recommended. 3. Aneurysmal dilatation of the ascending aorta. Recommend annual imaging followup by CTA or MRA.  03/21/15 - CT scan. IMPRESSION:  No CT evidence for intra-abdominal or thoracic recurrence or metastatic disease. Stable 3 mm right middle lobe pulmonary nodule. No acute abnormality within the chest or abdomen. No significant change in aneurysmal dilatation of the ascending aorta allowing for differences in technique. Recommend annual imaging followup by CTA or MRA.   07/04/15 - CT scan of chest/abdomen -  IMPRESSION:  No CT evidence for intra-abdominal or thoracic recurrence or metastatic disease. Stable 3 mm right middle lobe pulmonary nodule. No significant interval change aneurysmal dilatation of the ascending aorta. Recommend annual imaging followup by CTA or MRA. This recommendation follows 2010 ACCF/AHA/AATS/ACR/ASA/SCA/SCAI/SIR/STS/SVM Guidelines for the Diagnosis and Management of Patients with Thoracic Aortic Disease. Circulation.2010; 121: W867-R373.   ASSESSMENT / PLAN:   1. Stage IV poorly differentiated non small cell left lung cancer diagnosed by mediastinal mass biopsy on 02/07/11 (adenocarcinoma, acinar  pattern). Negative for EGFR/Kras/ALK mutations. CT scan on April 18 negative for any recurrent/progressive metastatic lung cancer  - have  reviewed labs from today and discussed with patient. He continues to do fairly steady without any new side effects from ongoing chemotherapy. He wants to continue on current treatment. Performance status is good, PS ECOG 0. Lower extremity edema is same (still grade 1), continue Aldactone alongwith Lasix as needed. Last 2D-ECHOcardiogram Nov 2014 showed mild valvular abnormalities (done for persistent leg edema), no cardiac symptoms. Last 24-hour urine did not show significant proteinuria - have reviewed labs from today and d/w patient. Blood counts are adequate for treatment, will proceed with next cycle of maintenance chemotherapy per protocol guidelines with Alimta 500 mg/m2 IV. Continue to monitor weekly labs, next MD followup in 3 weeks with labs and plan continued treatment.  2. Hyperglycemia - Blood glucose 160 today but post-prandial. He states blood sugar at home continues to do well and is following diabetic diet. Will monitor. 3. Lower extremity edema - grade 1, chronic, mild. Continue on current dose of diuretics and monitor.  4. Thrombocytopenia - grade 1, no bleeding issues. Platelet count in low normal range Continue to monitor.  5. Nutrition - eating well. Recent mild weight loss is intentional given increased workouts and playing tennis. 6. Serum bilirubin mildly elevated at 1.3 - will get US abdomen to evaluate liver. 7.  In between visits, he was advised to call or come to ER in case of fevers, bleeding, acute sickness or new symptoms. He is agreeable to above plan.    [name], MD   09/08/2015 11:05 AM     -

## 2015-09-09 ENCOUNTER — Inpatient Hospital Stay: Payer: BLUE CROSS/BLUE SHIELD

## 2015-09-09 ENCOUNTER — Encounter: Payer: Self-pay | Admitting: Internal Medicine

## 2015-09-09 ENCOUNTER — Inpatient Hospital Stay (HOSPITAL_BASED_OUTPATIENT_CLINIC_OR_DEPARTMENT_OTHER): Payer: BLUE CROSS/BLUE SHIELD | Admitting: Internal Medicine

## 2015-09-09 ENCOUNTER — Inpatient Hospital Stay: Payer: BLUE CROSS/BLUE SHIELD | Attending: Internal Medicine

## 2015-09-09 ENCOUNTER — Encounter: Payer: Self-pay | Admitting: *Deleted

## 2015-09-09 VITALS — BP 138/86 | HR 72 | Temp 96.5°F | Resp 18

## 2015-09-09 VITALS — BP 136/88 | HR 71 | Temp 96.7°F | Resp 18 | Ht 70.0 in | Wt 198.2 lb

## 2015-09-09 DIAGNOSIS — R739 Hyperglycemia, unspecified: Secondary | ICD-10-CM | POA: Diagnosis not present

## 2015-09-09 DIAGNOSIS — R600 Localized edema: Secondary | ICD-10-CM

## 2015-09-09 DIAGNOSIS — C3412 Malignant neoplasm of upper lobe, left bronchus or lung: Secondary | ICD-10-CM | POA: Diagnosis not present

## 2015-09-09 DIAGNOSIS — Z5111 Encounter for antineoplastic chemotherapy: Secondary | ICD-10-CM | POA: Insufficient documentation

## 2015-09-09 DIAGNOSIS — D696 Thrombocytopenia, unspecified: Secondary | ICD-10-CM

## 2015-09-09 DIAGNOSIS — Z87891 Personal history of nicotine dependence: Secondary | ICD-10-CM

## 2015-09-09 DIAGNOSIS — C3490 Malignant neoplasm of unspecified part of unspecified bronchus or lung: Secondary | ICD-10-CM

## 2015-09-09 DIAGNOSIS — Z79899 Other long term (current) drug therapy: Secondary | ICD-10-CM | POA: Diagnosis not present

## 2015-09-09 DIAGNOSIS — C801 Malignant (primary) neoplasm, unspecified: Secondary | ICD-10-CM

## 2015-09-09 LAB — COMPREHENSIVE METABOLIC PANEL
ALBUMIN: 4.1 g/dL (ref 3.5–5.0)
ALT: 18 U/L (ref 17–63)
AST: 24 U/L (ref 15–41)
Alkaline Phosphatase: 43 U/L (ref 38–126)
Anion gap: 4 — ABNORMAL LOW (ref 5–15)
BILIRUBIN TOTAL: 0.9 mg/dL (ref 0.3–1.2)
BUN: 17 mg/dL (ref 6–20)
CHLORIDE: 98 mmol/L — AB (ref 101–111)
CO2: 30 mmol/L (ref 22–32)
CREATININE: 1.07 mg/dL (ref 0.61–1.24)
Calcium: 8.8 mg/dL — ABNORMAL LOW (ref 8.9–10.3)
GFR calc Af Amer: >60 mL/min (ref >60)
GLUCOSE: 105 mg/dL — AB (ref 65–99)
POTASSIUM: 4.4 mmol/L (ref 3.5–5.1)
Sodium: 132 mmol/L — ABNORMAL LOW (ref 135–145)
Total Protein: 7.2 g/dL (ref 6.5–8.1)

## 2015-09-09 LAB — CBC WITH DIFFERENTIAL/PLATELET
BASOS ABS: 0 10*3/uL (ref 0–0.1)
BASOS PCT: 1 %
Eosinophils Absolute: 0.1 10*3/uL (ref 0–0.7)
Eosinophils Relative: 2 %
HEMATOCRIT: 42 % (ref 40.0–52.0)
Hemoglobin: 14.2 g/dL (ref 13.0–18.0)
LYMPHS PCT: 27 %
Lymphs Abs: 1.4 10*3/uL (ref 1.0–3.6)
MCH: 32.3 pg (ref 26.0–34.0)
MCHC: 33.8 g/dL (ref 32.0–36.0)
MCV: 95.6 fL (ref 80.0–100.0)
Monocytes Absolute: 0.6 10*3/uL (ref 0.2–1.0)
Monocytes Relative: 11 %
NEUTROS ABS: 3.1 10*3/uL (ref 1.4–6.5)
Neutrophils Relative %: 59 %
PLATELETS: 156 10*3/uL (ref 150–440)
RBC: 4.39 MIL/uL — AB (ref 4.40–5.90)
RDW: 14 % (ref 11.5–14.5)
WBC: 5.2 10*3/uL (ref 3.8–10.6)

## 2015-09-09 MED ORDER — SODIUM CHLORIDE 0.9 % IJ SOLN
10.0000 mL | INTRAMUSCULAR | Status: DC | PRN
  Filled 2015-09-09: qty 10

## 2015-09-09 MED ORDER — HEPARIN SOD (PORK) LOCK FLUSH 100 UNIT/ML IV SOLN
500.0000 [IU] | Freq: Once | INTRAVENOUS | Status: AC
  Administered 2015-09-09: 500 [IU] via INTRAVENOUS
  Filled 2015-09-09: qty 5

## 2015-09-09 MED ORDER — SODIUM CHLORIDE 0.9 % IV SOLN
Freq: Once | INTRAVENOUS | Status: AC
  Administered 2015-09-09: 15:00:00 via INTRAVENOUS
  Filled 2015-09-09: qty 1000

## 2015-09-09 MED ORDER — SODIUM CHLORIDE 0.9 % IV SOLN
Freq: Once | INTRAVENOUS | Status: AC
  Administered 2015-09-09: 16:00:00 via INTRAVENOUS
  Filled 2015-09-09: qty 4

## 2015-09-09 MED ORDER — INV-PEMETREXED CHEMO INJECTION 500 MG LILLY S130
1050.0000 mg | Freq: Once | INTRAVENOUS | Status: AC
  Administered 2015-09-09: 1050 mg via INTRAVENOUS
  Filled 2015-09-09: qty 42

## 2015-09-09 NOTE — Progress Notes (Signed)
09/09/2015 '@1430'$  [name] returns to clinic this afternoon for consideration of treatment cycle 575 of maintenance Alimta on [employer] S130 protocol. Patient reports he has been doing well since his last Alimta infusion. Patient had abdominal U/S following last treatment due to slight elevation in bilirubin level. T.bili is 0.9 today - which is back within normal limits. Dr. Ma Hillock reports the abd ultrasound was normal as well. CBC and chemistries are all within acceptable parameters for patient to receive treatment today. VS are stable and patient reports his B/P is a little higher than normal due to not eating healthy over the past 2-3 weeks. Weight is back up a little, but patient reports he has been purposely trying to lose some weight. Reports continued mild edema in his feet and lower legs - 1+ pitting nearly up to knees on left leg, but states this is his norm. Dr. Ma Hillock in to examine [name] and is made aware of edema that is unchanged. States OK to proceed with Alimta today. Patient will receive his B12 injection at his next appointment. He requests needing to move his appointment back a couple of days for personal reasons. Will contact study monitor to make sure this is OK.   Edema in bilateral lower extremeties - grade 1; attribution

## 2015-09-10 NOTE — Progress Notes (Signed)
[place]  Telephone:(336) [phone] Fax:(336) [phone]     ID: Mitchell Mosley OB: Dec 16, 1960  MR#: [id number]  ITG#:[id number]  Patient Care Team: [name], MD as PCP - General (Family Medicine)  CHIEF COMPLAINT/DIAGNOSIS:  1. Stage IV poorly differentiated non small cell left lung cancer diagnosed by mediastinal mass biopsy on 02/07/11 (adenocarcinoma, acinar pattern). Negative for EGFR/Kras/ALK mutations. PET scan Feb 2012 showed abnormalities in neck and chest, chest wall, the body of T1, and in the left iliac bone suggestive of bony involvement. On [employer], completed Carboplatinum/Alimta x 4, now on maintenance Alimta since 06/01/11.  2. 2D-ECHOcardiogram Nov 2014 showed mild valvular abnormalities (done for persistent leg edema) - also seen by cardiology.    HISTORY OF PRESENT ILLNESS:  Patient returns for continuing oncology follow-up, he is on Alimta chemotherapy, he is enrolled on clinical trial. States he is doing steady overall, he denies new complaints, he still plays and coaches tennis regularly. He continues to get chronic mild swelling in both legs especially at the end of the day is steady and under control, takes Aldactone and Lasix as needed. Denies any new cough, dyspnea, orthopnea or PND. No hemoptysis or chest pain. No nausea, vomiting or diarrhea. No peripheral neuropathy. No new skin rash. No pain issues. Denies imbalance or falls.   REVIEW OF SYSTEMS:   ROS As in HPI above. In addition, no fevers. No new headaches or focal weakness. No new mood disturbances. No sore throat or dysphagia. No dizziness or palpitation. No abdominal pain, constipation, diarrhea, dysuria or hematuria. No new skin rash or bleeding symptoms. No new paresthesias in extremities. No polyuria polydipsia . PS ECOG 0.  PAST MEDICAL HISTORY: Reviewed Past Medical History  Diagnosis Date  . Allergy   . Mild valvular heart disease Nov. 2014    per 2  D Echocardiogram done for persistent leg edema, monitored by cardiology  . Lung cancer   Denies history of diabetes, hypertension, heart disease, stroke, liver disease or kidney problems.  PAST SURGICAL HISTORY:Reviewed Past Surgical History  Procedure Laterality Date  . Elbow surgery    . Left anterior thoracotomy with biopsy  March 2012  . Portacath placement  March 2012    FAMILY HISTORY:Reviewed Family History  Problem Relation Age of Onset  . Leukemia Father     SOCIAL HISTORY: Reviewed Social History  Substance Use Topics  . Smoking status: Former Smoker    Types: Cigarettes  . Smokeless tobacco: Not on file  . Alcohol Use: 0.0 oz/week    0 Standard drinks or equivalent per week     Comment: "social drinker"   Currently nonsmoker (minimal smoking history many years ago), denies second-hand smoke exposure. Social alcohol. Works as a Musician.  Allergies  Allergen Reactions  . Penicillins     Reaction and severity unknown    Current Outpatient Prescriptions  Medication Sig Dispense Refill  . folic acid (FOLVITE) 1 MG tablet Take 1 tablet (1 mg total) by mouth daily. 30 tablet 6  . furosemide (LASIX) 20 MG tablet Take 1 tablet (20 mg total) by mouth daily. One tablet daily as needed for swelling 30 tablet 2  . glucosamine-chondroitin 500-400 MG tablet Take 1 tablet by mouth daily.    . meclizine (ANTIVERT) 25 MG tablet Take 1 tablet (25 mg total) by mouth 3 (three) times daily as needed for dizziness. 60 tablet 0  . meloxicam (MOBIC) 7.5 MG tablet Take 7.5 mg by  mouth daily.    . Multiple Vitamin (MULTIVITAMIN) capsule Take 1 capsule by mouth daily.    . Omega-3 Fatty Acids (FISH OIL) 1000 MG CAPS Take 1 capsule by mouth daily.    . ondansetron (ZOFRAN-ODT) 4 MG disintegrating tablet Take 4 mg by mouth every 4 (four) hours as needed for nausea or vomiting.    . senna (SENOKOT) 8.6 MG tablet Take 1 tablet by mouth daily. Take as needed around time of  chemotherapy    . spironolactone (ALDACTONE) 25 MG tablet Take 1 tablet (25 mg total) by mouth daily. 90 tablet 1  . vitamin B-12 (CYANOCOBALAMIN) 1000 MCG tablet Take 1,000 mcg by mouth daily.     No current facility-administered medications for this visit.   Facility-Administered Medications Ordered in Other Visits  Medication Dose Route Frequency Provider Last Rate Last Dose  . sodium chloride 0.9 % injection 10 mL  10 mL Intracatheter PRN [name], MD   10 mL at 05/27/15 1543  . sodium chloride 0.9 % injection 10 mL  10 mL Intracatheter PRN [name], MD   10 mL at 06/17/15 1510  . sodium chloride 0.9 % injection 10 mL  10 mL Intravenous PRN [name], MD   10 mL at 07/08/15 1340    OBJECTIVE: Filed Vitals:   09/09/15 1403  BP: 136/88  Pulse: 71  Temp: 96.7 F (35.9 C)  Resp: 18     Body mass index is 28.44 kg/(m^2).    ECOG FS:0 - Asymptomatic  GENERAL: Alert and oriented and in no acute distress. No icterus or pallor. HEENT: EOMs intact. Oral exam negative for thrush. No cervical lymphadenopathy. CVS: S1S2, regular, no murmurs LUNGS: Bilaterally clear to auscultation, no crepitations or rhonchi. ABDOMEN: Soft, nontender. No hepatomegaly clinically.  NEURO: grossly nonfocal, cranial nerves are intact. Gait unremarkable. EXTREMITIES: b/l trace pedal edema, chronic.   LAB RESULTS:    Component Value Date/Time   NA 132* 09/09/2015 1153   NA 138 12/10/2014 1341   K 4.4 09/09/2015 1153   K 3.6 12/10/2014 1341   CL 98* 09/09/2015 1153   CL 102 12/10/2014 1341   CO2 30 09/09/2015 1153   CO2 30 12/10/2014 1341   GLUCOSE 105* 09/09/2015 1153   GLUCOSE 160* 12/10/2014 1341   BUN 17 09/09/2015 1153   BUN 21* 12/10/2014 1341   CREATININE 1.07 09/09/2015 1153   CREATININE 1.10 03/25/2015 1453   CALCIUM 8.8* 09/09/2015 1153   CALCIUM 8.4* 12/10/2014 1341   PROT 7.2 09/09/2015 1153   PROT 6.6 12/10/2014 1341   ALBUMIN 4.1 09/09/2015 1153   ALBUMIN 3.4  12/10/2014 1341   AST 24 09/09/2015 1153   AST 17 12/10/2014 1341   ALT 18 09/09/2015 1153   ALT 23 12/10/2014 1341   ALKPHOS 43 09/09/2015 1153   ALKPHOS 50 12/10/2014 1341   BILITOT 0.9 09/09/2015 1153   BILITOT 0.5 12/10/2014 1341   GFRNONAA >60 09/09/2015 1153   GFRNONAA >60 03/25/2015 1453   GFRNONAA >60 12/31/2014 1346   GFRAA >60 09/09/2015 1153   GFRAA >60 03/25/2015 1453   GFRAA >60 12/31/2014 1346   Lab Results  Component Value Date   WBC 5.2 09/09/2015   NEUTROABS 3.1 09/09/2015   HGB 14.2 09/09/2015   HCT 42.0 09/09/2015   MCV 95.6 09/09/2015   PLT 156 09/09/2015  ANC 3.1.   STUDIES: 08/23/14 - CT scan of chest and abdomen with contrast. IMPRESSION: 1. No acute findings and no evidence for metastatic  disease within the chest abdomen or pelvis.  12/06/14 - CT scan of chest and abdomen with contrast. IMPRESSION: 1. No acute findings and no evidence for metastatic disease within the chest or abdomen. 2. 3 mm right middle lobe nodule is unchanged from previous exam. Attention on followup imaging recommended. 3. Aneurysmal dilatation of the ascending aorta. Recommend annual imaging followup by CTA or MRA.  03/21/15 - CT scan. IMPRESSION:  No CT evidence for intra-abdominal or thoracic recurrence or metastatic disease. Stable 3 mm right middle lobe pulmonary nodule. No acute abnormality within the chest or abdomen. No significant change in aneurysmal dilatation of the ascending aorta allowing for differences in technique. Recommend annual imaging followup by CTA or MRA.   07/04/15 - CT scan of chest/abdomen -  IMPRESSION:  No CT evidence for intra-abdominal or thoracic recurrence or metastatic disease. Stable 3 mm right middle lobe pulmonary nodule. No significant interval change aneurysmal dilatation of the ascending aorta. Recommend annual imaging followup by CTA or MRA. This recommendation follows 2010 ACCF/AHA/AATS/ACR/ASA/SCA/SCAI/SIR/STS/SVM Guidelines for the Diagnosis  and Management of Patients with Thoracic Aortic Disease. Circulation.2010; 121: W388-E280.   ASSESSMENT / PLAN:   1. Stage IV poorly differentiated non small cell left lung cancer diagnosed by mediastinal mass biopsy on 02/07/11 (adenocarcinoma, acinar pattern). Negative for EGFR/Kras/ALK mutations. CT scan on April 18 negative for any recurrent/progressive metastatic lung cancer  - have  reviewed labs from today and discussed with patient. He continues to do fairly steady without any new side effects from ongoing chemotherapy. He wants to continue on current treatment. Performance status is good, PS ECOG 0. Lower extremity edema is same (still grade 1), continue Aldactone alongwith Lasix as needed. Last 2D-ECHOcardiogram Nov 2014 showed mild valvular abnormalities (done for persistent leg edema), no cardiac symptoms. Last 24-hour urine did not show significant proteinuria   -   reviewed labs from today and d/w patient. Blood counts are adequate for treatment, will proceed with next cycle of maintenance chemotherapy per protocol guidelines with Alimta 500 mg/m2 IV. Continue to monitor labs per protocol requirements, next MD followup in 3 weeks with labs and plan continued treatment.  2. Hyperglycemia - Blood glucose 105 today but post-prandial. He states blood sugar at home continues to do well and is following diabetic diet. Will monitor. 3. Lower extremity edema - grade 1, chronic, mild. Continue on current dose of diuretics and monitor.  4. Thrombocytopenia - grade 1, no bleeding issues. Platelet count in low normal range Continue to monitor.  5. Nutrition - eating well. Mild weight loss is intentional given increased workouts and playing tennis. 6. Serum bilirubin - normalized on LFT today, recent ultrasound liver reported unremarkable.   7.  In between visits, he was advised to call or come to ER in case of fevers, bleeding, acute sickness or new symptoms. He is agreeable to above plan.     [name], MD   09/10/2015 9:10 AM     -

## 2015-09-28 ENCOUNTER — Inpatient Hospital Stay: Payer: BLUE CROSS/BLUE SHIELD

## 2015-09-28 ENCOUNTER — Inpatient Hospital Stay (HOSPITAL_BASED_OUTPATIENT_CLINIC_OR_DEPARTMENT_OTHER): Payer: BLUE CROSS/BLUE SHIELD | Admitting: Internal Medicine

## 2015-09-28 ENCOUNTER — Encounter: Payer: Self-pay | Admitting: *Deleted

## 2015-09-28 ENCOUNTER — Encounter: Payer: Self-pay | Admitting: Internal Medicine

## 2015-09-28 VITALS — BP 127/81 | HR 77 | Temp 96.6°F | Wt 196.3 lb

## 2015-09-28 DIAGNOSIS — C3490 Malignant neoplasm of unspecified part of unspecified bronchus or lung: Secondary | ICD-10-CM

## 2015-09-28 DIAGNOSIS — C3492 Malignant neoplasm of unspecified part of left bronchus or lung: Secondary | ICD-10-CM | POA: Diagnosis not present

## 2015-09-28 DIAGNOSIS — C3412 Malignant neoplasm of upper lobe, left bronchus or lung: Secondary | ICD-10-CM | POA: Diagnosis not present

## 2015-09-28 DIAGNOSIS — Z006 Encounter for examination for normal comparison and control in clinical research program: Secondary | ICD-10-CM | POA: Diagnosis not present

## 2015-09-28 LAB — CBC WITH DIFFERENTIAL/PLATELET
BASOS PCT: 0 %
Basophils Absolute: 0 10*3/uL (ref 0–0.1)
Eosinophils Absolute: 0.1 10*3/uL (ref 0–0.7)
Eosinophils Relative: 2 %
HEMATOCRIT: 44.1 % (ref 40.0–52.0)
HEMOGLOBIN: 15.1 g/dL (ref 13.0–18.0)
LYMPHS ABS: 1.1 10*3/uL (ref 1.0–3.6)
Lymphocytes Relative: 24 %
MCH: 32.2 pg (ref 26.0–34.0)
MCHC: 34.2 g/dL (ref 32.0–36.0)
MCV: 94.1 fL (ref 80.0–100.0)
MONO ABS: 0.4 10*3/uL (ref 0.2–1.0)
MONOS PCT: 7 %
NEUTROS ABS: 3.2 10*3/uL (ref 1.4–6.5)
NEUTROS PCT: 67 %
Platelets: 172 10*3/uL (ref 150–440)
RBC: 4.68 MIL/uL (ref 4.40–5.90)
RDW: 13.6 % (ref 11.5–14.5)
WBC: 4.8 10*3/uL (ref 3.8–10.6)

## 2015-09-28 LAB — COMPREHENSIVE METABOLIC PANEL
ALK PHOS: 50 U/L (ref 38–126)
ALT: 20 U/L (ref 17–63)
ANION GAP: 6 (ref 5–15)
AST: 27 U/L (ref 15–41)
Albumin: 4.3 g/dL (ref 3.5–5.0)
BILIRUBIN TOTAL: 0.8 mg/dL (ref 0.3–1.2)
BUN: 16 mg/dL (ref 6–20)
CALCIUM: 9.4 mg/dL (ref 8.9–10.3)
CO2: 31 mmol/L (ref 22–32)
Chloride: 97 mmol/L — ABNORMAL LOW (ref 101–111)
Creatinine, Ser: 1.07 mg/dL (ref 0.61–1.24)
GLUCOSE: 128 mg/dL — AB (ref 65–99)
POTASSIUM: 4.5 mmol/L (ref 3.5–5.1)
Sodium: 134 mmol/L — ABNORMAL LOW (ref 135–145)
TOTAL PROTEIN: 7.7 g/dL (ref 6.5–8.1)

## 2015-09-28 MED ORDER — CYANOCOBALAMIN 1000 MCG/ML IJ SOLN
1000.0000 ug | Freq: Once | INTRAMUSCULAR | Status: AC
  Administered 2015-09-28: 1000 ug via INTRAMUSCULAR
  Filled 2015-09-28: qty 1

## 2015-09-28 MED ORDER — SODIUM CHLORIDE 0.9 % IJ SOLN
10.0000 mL | INTRAMUSCULAR | Status: DC | PRN
  Administered 2015-09-28: 10 mL
  Filled 2015-09-28: qty 10

## 2015-09-28 MED ORDER — SODIUM CHLORIDE 0.9 % IV SOLN
Freq: Once | INTRAVENOUS | Status: AC
  Administered 2015-09-28: 15:00:00 via INTRAVENOUS
  Filled 2015-09-28: qty 4

## 2015-09-28 MED ORDER — HEPARIN SOD (PORK) LOCK FLUSH 100 UNIT/ML IV SOLN
500.0000 [IU] | Freq: Once | INTRAVENOUS | Status: DC | PRN
  Filled 2015-09-28: qty 5

## 2015-09-28 MED ORDER — SODIUM CHLORIDE 0.9 % IV SOLN
Freq: Once | INTRAVENOUS | Status: AC
  Administered 2015-09-28: 15:00:00 via INTRAVENOUS
  Filled 2015-09-28: qty 1000

## 2015-09-28 MED ORDER — SODIUM CHLORIDE 0.9 % IV SOLN
1050.0000 mg | Freq: Once | INTRAVENOUS | Status: AC
  Administered 2015-09-28: 1050 mg via INTRAVENOUS
  Filled 2015-09-28: qty 42

## 2015-09-28 NOTE — Progress Notes (Signed)
09/28/15 '@13'$ :7 Mitchell Mosley returns to clinic this afternoon for consideration of cycle #576 Alimta infusion. He denies any new adverse events related to his chemo. He has ongoing edema in his bilateral lower extremeties, which occasionally moves up into lower legs and is also pitting at times. He reports it improves after he takes his fluid pill, but states it does not help to elevate his feet. Reports he is doing well otherwise. States he played 11 hours of tennis straight 2 weeks ago and did well, raising over $10,000 for charity. VS are stable. CBC and chemistries are within acceptable parameters for patient to receive Alimta. Grade 1 hyponatremia noted (sodium level 134). Performance status remains 0. Weight is stable at 89.05kg. Dr. Rogue Bussing in to examine patient. He also observed pitting edema in patient's lower legs and ankles. Mitchell Mosley is due for his vitamin B12 injection today, and will be scheduled for a CT scan prior to his next infusion of Alimta. CT scheduled for 10/17/15 and patient to return to clinic on 10/21/15.   Edema in bilateral lower extremeties - grade 1; attribution likely related  Hyponatremia - grade 1; possibly related

## 2015-09-28 NOTE — Progress Notes (Signed)
[place] OFFICE PROGRESS NOTE  Patient Care Team: [name], MD as PCP - General (Family Medicine)   SUMMARY OF ONCOLOGIC HISTORY:  # 2012- METASTATIC ADENO CA of LEFT LUNG [acinar pattern] s/p MED LN Bx [NEG- EGFR/K-ras/ALK mutation];[name]; [name]   INTERVAL HISTORY: A pleasant 54 year old [place] male patient with above history of metastatic adenocarcinoma the lung currently on maintenance Alimta since June 2012 is here for follow-up.  Overall patient is doing well. He denies any significant weight loss. Denies any nausea vomiting abdominal pain. Denies any sores in the mouth. In fact patient, participated in a fundraising event; played tennis for 11 hours.  He denies any significant swelling in the legs; he is on Lasix/spironolactone. Denies any skin rash.  REVIEW OF SYSTEMS:  A complete 10 point review of system is done which is negative except mentioned above/history of present illness.   PAST MEDICAL HISTORY :  Past Medical History  Diagnosis Date  . Allergy   . Mild valvular heart disease Nov. 2014    per 2 D Echocardiogram done for persistent leg edema, monitored by cardiology  . Lung cancer ([place])   . Peripheral edema     PAST SURGICAL HISTORY :   Past Surgical History  Procedure Laterality Date  . Elbow surgery    . Left anterior thoracotomy with biopsy  March 2012  . Portacath placement  March 2012    FAMILY HISTORY :   Family History  Problem Relation Age of Onset  . Leukemia Father     SOCIAL HISTORY:   Social History  Substance Use Topics  . Smoking status: Former Smoker    Types: Cigarettes  . Smokeless tobacco: None  . Alcohol Use: 0.0 oz/week    0 Standard drinks or equivalent per week     Comment: "social drinker"     ALLERGIES:  is allergic to penicillins.  MEDICATIONS:  Current Outpatient Prescriptions   Medication Sig Dispense Refill  . folic acid (FOLVITE) 1 MG tablet Take 1 tablet (1 mg total) by mouth daily. 30 tablet 6  . furosemide (LASIX) 20 MG tablet Take 1 tablet (20 mg total) by mouth daily. One tablet daily as needed for swelling 30 tablet 2  . glucosamine-chondroitin 500-400 MG tablet Take 1 tablet by mouth daily.    . meclizine (ANTIVERT) 25 MG tablet Take 1 tablet (25 mg total) by mouth 3 (three) times daily as needed for dizziness. 60 tablet 0  . meloxicam (MOBIC) 7.5 MG tablet Take 7.5 mg by mouth daily.    . Multiple Vitamin (MULTIVITAMIN) capsule Take 1 capsule by mouth daily.    . Omega-3 Fatty Acids (FISH OIL) 1000 MG CAPS Take 1 capsule by mouth daily.    . ondansetron (ZOFRAN-ODT) 4 MG disintegrating tablet Take 4 mg by mouth every 4 (four) hours as needed for nausea or vomiting.    . senna (SENOKOT) 8.6 MG tablet Take 1 tablet by mouth daily. Take as needed around time of chemotherapy    . spironolactone (ALDACTONE) 25 MG tablet Take 1 tablet (25 mg total) by mouth daily. 90 tablet 1  . vitamin B-12 (CYANOCOBALAMIN) 1000 MCG tablet Take 1,000 mcg by mouth daily.     No current facility-administered medications for this visit.   Facility-Administered Medications Ordered in Other Visits  Medication Dose Route Frequency Provider Last Rate Last Dose  . sodium chloride 0.9 % injection  10 mL  10 mL Intracatheter PRN [name], MD   10 mL at 05/27/15 1543  . sodium chloride 0.9 % injection 10 mL  10 mL Intracatheter PRN [name], MD   10 mL at 06/17/15 1510  . sodium chloride 0.9 % injection 10 mL  10 mL Intravenous PRN [name], MD   10 mL at 07/08/15 1340    PHYSICAL EXAMINATION: ECOG PERFORMANCE STATUS: 0 - Asymptomatic  BP 127/81 mmHg  Pulse 77  Temp(Src) 96.6 F (35.9 C) (Tympanic)  Wt 196 lb 5.1 oz (89.05 kg)  SpO2 98%  Filed Weights   09/28/15 1351  Weight: 196 lb 5.1 oz (89.05 kg)    GENERAL: Well-nourished well-developed; Alert, no  distress and comfortable. [name]. EYES: no pallor or icterus OROPHARYNX: no thrush or ulceration; good dentition  NECK: supple, [name], [name]; No lower extremity edema ABDOMEN:abdomen soft, [name]  LABORATORY DATA:  I have reviewed the data as listed    Component Value Date/Time   NA 134* 09/28/2015 0828   NA 138 12/10/2014 1341   K 4.5 09/28/2015 0828   K 3.6 12/10/2014 1341   CL 97* 09/28/2015 0828   CL 102 12/10/2014 1341   CO2 31 09/28/2015 0828   CO2 30 12/10/2014 1341   GLUCOSE 128* 09/28/2015 0828   GLUCOSE 160* 12/10/2014 1341   BUN 16 09/28/2015 0828   BUN 21* 12/10/2014 1341   CREATININE 1.07 09/28/2015 0828   CREATININE 1.10 03/25/2015 1453   CALCIUM 9.4 09/28/2015 0828   CALCIUM 8.4* 12/10/2014 1341   PROT 7.7 09/28/2015 0828   PROT 6.6 12/10/2014 1341   ALBUMIN 4.3 09/28/2015 0828   ALBUMIN 3.4 12/10/2014 1341   AST 27 09/28/2015 0828   AST 17 12/10/2014 1341   ALT 20 09/28/2015 0828   ALT 23 12/10/2014 1341   ALKPHOS 50 09/28/2015 0828   ALKPHOS 50 12/10/2014 1341   BILITOT 0.8 09/28/2015 0828   BILITOT 0.5 12/10/2014 1341   GFRNONAA >60 09/28/2015 0828   GFRNONAA >60 03/25/2015 1453   GFRNONAA >60 12/31/2014 1346   GFRAA >60 09/28/2015 0828   GFRAA >60 03/25/2015 1453   GFRAA >60 12/31/2014 1346    No results found for: SPEP, UPEP  Lab Results  Component Value Date   WBC 4.8 09/28/2015   NEUTROABS 3.2 09/28/2015   HGB 15.1 09/28/2015   HCT 44.1 09/28/2015   MCV 94.1 09/28/2015   PLT 172 09/28/2015      Chemistry      Component Value Date/Time    NA 134* 09/28/2015 0828   NA 138 12/10/2014 1341   K 4.5 09/28/2015 0828   K 3.6 12/10/2014 1341   CL 97* 09/28/2015 0828   CL 102 12/10/2014 1341   CO2 31 09/28/2015 0828   CO2 30 12/10/2014 1341   BUN 16 09/28/2015 0828   BUN 21* 12/10/2014 1341   CREATININE 1.07 09/28/2015 0828   CREATININE 1.10 03/25/2015 1453      Component Value Date/Time   CALCIUM 9.4 09/28/2015 0828   CALCIUM 8.4* 12/10/2014 1341   ALKPHOS 50 09/28/2015 8841  ALKPHOS 50 12/10/2014 1341   AST 27 09/28/2015 0828   AST 17 12/10/2014 1341   ALT 20 09/28/2015 0828   ALT 23 12/10/2014 1341   BILITOT 0.8 09/28/2015 0828   BILITOT 0.5 12/10/2014 1341       RADIOGRAPHIC STUDIES: I have personally reviewed the radiological images as listed and agreed with the findings in the report. No results found.   ASSESSMENT & PLAN:   # Metastatic adenocarcinoma the lung- currently on maintenance Alimta/on protocol; June 2012. Clinically no concerns for obvious progression at this time.   # Continue Alimta every 3 weeks. Patient is on folic MGNO/I37 injections. CBC CMP reviewed unremarkable.   #  Patient is awaiting a CT scan in the next 2 weeks; follow up in approximately 3 weeks.    No orders of the defined types were placed in this encounter.   All questions were answered. The patient knows to call the clinic with any problems, questions or concerns. No barriers to learning was detected. I spent 25 minutes counseling the patient face to face. The total time spent in the appointment was 30 minutes and more than 50% was on counseling and review of test results     [name], MD 09/28/2015 2:02 PM

## 2015-10-17 ENCOUNTER — Ambulatory Visit
Admission: RE | Admit: 2015-10-17 | Discharge: 2015-10-17 | Disposition: A | Discharge status: Home / Self Care | Payer: BLUE CROSS/BLUE SHIELD | Source: Ambulatory Visit | Attending: Internal Medicine | Admitting: Internal Medicine

## 2015-10-17 DIAGNOSIS — C3490 Malignant neoplasm of unspecified part of unspecified bronchus or lung: Secondary | ICD-10-CM | POA: Insufficient documentation

## 2015-10-17 DIAGNOSIS — R911 Solitary pulmonary nodule: Secondary | ICD-10-CM | POA: Diagnosis not present

## 2015-10-17 MED ORDER — IOHEXOL 300 MG/ML  SOLN
100.0000 mL | Freq: Once | INTRAMUSCULAR | Status: AC | PRN
  Administered 2015-10-17: 100 mL via INTRAVENOUS

## 2015-10-21 ENCOUNTER — Ambulatory Visit: Payer: BLUE CROSS/BLUE SHIELD

## 2015-10-21 ENCOUNTER — Encounter: Payer: Self-pay | Admitting: Internal Medicine

## 2015-10-21 ENCOUNTER — Other Ambulatory Visit: Payer: Self-pay | Admitting: *Deleted

## 2015-10-21 ENCOUNTER — Inpatient Hospital Stay: Payer: BLUE CROSS/BLUE SHIELD | Attending: Internal Medicine

## 2015-10-21 ENCOUNTER — Encounter: Payer: Self-pay | Admitting: *Deleted

## 2015-10-21 ENCOUNTER — Inpatient Hospital Stay: Payer: BLUE CROSS/BLUE SHIELD

## 2015-10-21 ENCOUNTER — Other Ambulatory Visit: Payer: BLUE CROSS/BLUE SHIELD

## 2015-10-21 ENCOUNTER — Ambulatory Visit: Payer: BLUE CROSS/BLUE SHIELD | Admitting: Internal Medicine

## 2015-10-21 ENCOUNTER — Inpatient Hospital Stay (HOSPITAL_BASED_OUTPATIENT_CLINIC_OR_DEPARTMENT_OTHER): Payer: BLUE CROSS/BLUE SHIELD | Admitting: Internal Medicine

## 2015-10-21 VITALS — BP 121/70 | HR 70 | Temp 98.8°F | Resp 18 | Ht 70.0 in | Wt 195.1 lb

## 2015-10-21 DIAGNOSIS — Z87891 Personal history of nicotine dependence: Secondary | ICD-10-CM | POA: Insufficient documentation

## 2015-10-21 DIAGNOSIS — Z79899 Other long term (current) drug therapy: Secondary | ICD-10-CM

## 2015-10-21 DIAGNOSIS — C3490 Malignant neoplasm of unspecified part of unspecified bronchus or lung: Secondary | ICD-10-CM

## 2015-10-21 DIAGNOSIS — Z5111 Encounter for antineoplastic chemotherapy: Secondary | ICD-10-CM | POA: Insufficient documentation

## 2015-10-21 DIAGNOSIS — C3412 Malignant neoplasm of upper lobe, left bronchus or lung: Secondary | ICD-10-CM | POA: Diagnosis not present

## 2015-10-21 DIAGNOSIS — R600 Localized edema: Secondary | ICD-10-CM

## 2015-10-21 LAB — PROTEIN / CREATININE RATIO, URINE: CREATININE, URINE: 15 mg/dL

## 2015-10-21 LAB — COMPREHENSIVE METABOLIC PANEL
ALT: 18 U/L (ref 17–63)
AST: 22 U/L (ref 15–41)
Albumin: 4.2 g/dL (ref 3.5–5.0)
Alkaline Phosphatase: 42 U/L (ref 38–126)
Anion gap: 7 (ref 5–15)
BILIRUBIN TOTAL: 0.7 mg/dL (ref 0.3–1.2)
BUN: 18 mg/dL (ref 6–20)
CALCIUM: 9.4 mg/dL (ref 8.9–10.3)
CHLORIDE: 99 mmol/L — AB (ref 101–111)
CO2: 28 mmol/L (ref 22–32)
CREATININE: 0.89 mg/dL (ref 0.61–1.24)
Glucose, Bld: 105 mg/dL — ABNORMAL HIGH (ref 65–99)
Potassium: 4.2 mmol/L (ref 3.5–5.1)
Sodium: 134 mmol/L — ABNORMAL LOW (ref 135–145)
TOTAL PROTEIN: 7.5 g/dL (ref 6.5–8.1)

## 2015-10-21 LAB — CBC WITH DIFFERENTIAL/PLATELET
Basophils Absolute: 0 10*3/uL (ref 0–0.1)
Basophils Relative: 1 %
EOS PCT: 3 %
Eosinophils Absolute: 0.1 10*3/uL (ref 0–0.7)
HEMATOCRIT: 43.5 % (ref 40.0–52.0)
Hemoglobin: 14.9 g/dL (ref 13.0–18.0)
LYMPHS ABS: 1.3 10*3/uL (ref 1.0–3.6)
LYMPHS PCT: 29 %
MCH: 32.6 pg (ref 26.0–34.0)
MCHC: 34.2 g/dL (ref 32.0–36.0)
MCV: 95.2 fL (ref 80.0–100.0)
MONO ABS: 0.4 10*3/uL (ref 0.2–1.0)
Monocytes Relative: 10 %
NEUTROS ABS: 2.7 10*3/uL (ref 1.4–6.5)
Neutrophils Relative %: 57 %
PLATELETS: 161 10*3/uL (ref 150–440)
RBC: 4.57 MIL/uL (ref 4.40–5.90)
RDW: 13.7 % (ref 11.5–14.5)
WBC: 4.6 10*3/uL (ref 3.8–10.6)

## 2015-10-21 LAB — MAGNESIUM: MAGNESIUM: 2 mg/dL (ref 1.7–2.4)

## 2015-10-21 MED ORDER — SODIUM CHLORIDE 0.9 % IV SOLN
Freq: Once | INTRAVENOUS | Status: AC
  Administered 2015-10-21: 13:00:00 via INTRAVENOUS
  Filled 2015-10-21: qty 1000

## 2015-10-21 MED ORDER — HEPARIN SOD (PORK) LOCK FLUSH 100 UNIT/ML IV SOLN
INTRAVENOUS | Status: AC
  Filled 2015-10-21: qty 5

## 2015-10-21 MED ORDER — SODIUM CHLORIDE 0.9 % IV SOLN
Freq: Once | INTRAVENOUS | Status: AC
  Administered 2015-10-21: 13:00:00 via INTRAVENOUS
  Filled 2015-10-21: qty 4

## 2015-10-21 MED ORDER — HEPARIN SOD (PORK) LOCK FLUSH 100 UNIT/ML IV SOLN
500.0000 [IU] | Freq: Once | INTRAVENOUS | Status: AC | PRN
  Administered 2015-10-21: 500 [IU]

## 2015-10-21 MED ORDER — SODIUM CHLORIDE 0.9 % IV SOLN
500.0000 mg/m2 | Freq: Once | INTRAVENOUS | Status: AC
  Administered 2015-10-21: 1050 mg via INTRAVENOUS
  Filled 2015-10-21: qty 42

## 2015-10-21 NOTE — Progress Notes (Signed)
10/21/2015 '@11'$ :50am [facility] returns to clinic this morning for consideration of cycle #577 infusion of Alimta on the [employer] research study. Patient reports he is doing well and denies any new side effects from the study drug. He does continue to experience some edema in BLE, which is not new, and for which he takes both Lasix and spirinolactone. [facility] had a CT scan earlier this week, which continues to show no evidence of disease. He denies any new symptoms. Weight and VS are stable. CBC and chemistries are all within acceptable parameters for patient to receive his Alimta infusion as scheduled. Grade 1 hyponatremia noted (sodium level 134). Performance status remains 0. Dr. Rogue Bussing to sign orders. Adverse event with grade and attribution as follows:   Edema in bilateral lower extremeties - grade 1; attribution likely related  Hyponatremia - grade 1; possibly related

## 2015-10-21 NOTE — Progress Notes (Signed)
[facility] OFFICE PROGRESS NOTE  Patient Care Team: [name], MD as PCP - General (Family Medicine)   SUMMARY OF ONCOLOGIC HISTORY:  # 2012- METASTATIC ADENO CA of LEFT LUNG [acinar pattern] s/p MED LN Bx [NEG- EGFR/K-ras/ALK mutation];[name]; [name]; CT NOV 2016- NED   INTERVAL HISTORY: A pleasant 54 year old [place] male patient with above history of metastatic adenocarcinoma the lung currently on maintenance Alimta since June 2012 is here for follow-up.  Patient denies any unusual cough or shortness of breath or chest pain. He continues to have intermittent swelling in the legs especially with exertion; for which is on Lasix/spironolactone; he denies any skin rash.  Appetite is good. Denies any weight loss. Denies any unusual headaches or vision changes or double vision.  REVIEW OF SYSTEMS:  A complete 10 point review of system is done which is negative except mentioned above/history of present illness.   PAST MEDICAL HISTORY :  Past Medical History  Diagnosis Date  . Allergy   . Mild valvular heart disease Nov. 2014    per 2 D Echocardiogram done for persistent leg edema, monitored by cardiology  . Lung cancer ([place])   . Peripheral edema     PAST SURGICAL HISTORY :   Past Surgical History  Procedure Laterality Date  . Elbow surgery    . Left anterior thoracotomy with biopsy  March 2012  . Portacath placement  March 2012    FAMILY HISTORY :   Family History  Problem Relation Age of Onset  . Leukemia Father     SOCIAL HISTORY:   Social History  Substance Use Topics  . Smoking status: Former Smoker    Types: Cigarettes  . Smokeless tobacco: Never Used  . Alcohol Use: 0.0 oz/week    0 Standard drinks or equivalent per week     Comment: "social drinker"     ALLERGIES:  is allergic to penicillins.  MEDICATIONS:  Current Outpatient  Prescriptions  Medication Sig Dispense Refill  . folic acid (FOLVITE) 1 MG tablet Take 1 tablet (1 mg total) by mouth daily. 30 tablet 6  . furosemide (LASIX) 20 MG tablet Take 1 tablet (20 mg total) by mouth daily. One tablet daily as needed for swelling 30 tablet 2  . glucosamine-chondroitin 500-400 MG tablet Take 1 tablet by mouth daily.    . meclizine (ANTIVERT) 25 MG tablet Take 1 tablet (25 mg total) by mouth 3 (three) times daily as needed for dizziness. 60 tablet 0  . meloxicam (MOBIC) 7.5 MG tablet Take 7.5 mg by mouth daily.    . Multiple Vitamin (MULTIVITAMIN) capsule Take 1 capsule by mouth daily.    . Omega-3 Fatty Acids (FISH OIL) 1000 MG CAPS Take 1 capsule by mouth daily.    . ondansetron (ZOFRAN-ODT) 4 MG disintegrating tablet Take 4 mg by mouth every 4 (four) hours as needed for nausea or vomiting.    . senna (SENOKOT) 8.6 MG tablet Take 1 tablet by mouth daily. Take as needed around time of chemotherapy    . spironolactone (ALDACTONE) 25 MG tablet Take 1 tablet (25 mg total) by mouth daily. 90 tablet 1  . vitamin B-12 (CYANOCOBALAMIN) 1000 MCG tablet Take 1,000 mcg by mouth daily.     No current facility-administered medications for this visit.   Facility-Administered Medications Ordered in Other Visits  Medication Dose Route Frequency Provider Last Rate Last Dose  . heparin  lock flush 100 unit/mL  500 Units Intracatheter Once PRN [name], MD      . INV-PEMEtrexed Lilly S130 (ALIMTA) 1,050 mg in sodium chloride 0.9 % 100 mL chemo infusion  500 mg/m2 (Treatment Plan Actual) Intravenous Once [name], MD      . ondansetron (ZOFRAN) 8 mg, dexamethasone (DECADRON) 10 mg in sodium chloride 0.9 % 50 mL IVPB   Intravenous Once [name], [name], MD   10 mL at 05/27/15 1543  . sodium chloride 0.9 % injection 10 mL  10 mL Intracatheter PRN [name], MD   10 mL at  06/17/15 1510  . sodium chloride 0.9 % injection 10 mL  10 mL Intravenous PRN [name], MD   10 mL at 07/08/15 1340    PHYSICAL EXAMINATION: ECOG PERFORMANCE STATUS: 0 - Asymptomatic  BP 121/70 mmHg  Pulse 70  Temp(Src) 98.8 F (37.1 C) (Oral)  Resp 18  Ht '5\' 10"'  (1.778 m)  Wt 195 lb 1.7 oz (88.5 kg)  BMI 27.99 kg/m2  Filed Weights   10/21/15 1143  Weight: 195 lb 1.7 oz (88.5 kg)    GENERAL: Well-nourished well-developed; Alert, [name]; good dentition  NECK: supple, [name], [name]; No lower extremity edema ABDOMEN:abdomen soft, [name]  LABORATORY DATA:  I have reviewed the data as listed    Component Value Date/Time   NA 134* 10/21/2015 0804   NA 138 12/10/2014 1341   K 4.2 10/21/2015 0804   K 3.6 12/10/2014 1341   CL 99* 10/21/2015 0804   CL 102 12/10/2014 1341   CO2 28 10/21/2015 0804   CO2 30 12/10/2014 1341   GLUCOSE 105* 10/21/2015 0804   GLUCOSE 160* 12/10/2014 1341   BUN 18 10/21/2015 0804   BUN 21* 12/10/2014 1341   CREATININE 0.89 10/21/2015 0804   CREATININE 1.10 03/25/2015 1453   CALCIUM 9.4 10/21/2015 0804   CALCIUM 8.4* 12/10/2014 1341   PROT 7.5 10/21/2015 0804   PROT 6.6 12/10/2014 1341   ALBUMIN 4.2 10/21/2015 0804   ALBUMIN 3.4 12/10/2014 1341   AST 22 10/21/2015 0804   AST 17 12/10/2014 1341   ALT 18 10/21/2015 0804   ALT 23 12/10/2014 1341   ALKPHOS 42 10/21/2015 0804   ALKPHOS 50 12/10/2014 1341   BILITOT 0.7 10/21/2015 0804   BILITOT 0.5  12/10/2014 1341   GFRNONAA >60 10/21/2015 0804   GFRNONAA >60 03/25/2015 1453   GFRNONAA >60 12/31/2014 1346   GFRAA >60 10/21/2015 0804   GFRAA >60 03/25/2015 1453   GFRAA >60 12/31/2014 1346    No results found for: SPEP, UPEP  Lab Results  Component Value Date   WBC 4.6 10/21/2015   NEUTROABS 2.7 10/21/2015   HGB 14.9 10/21/2015   HCT 43.5 10/21/2015   MCV 95.2 10/21/2015   PLT 161 10/21/2015      Chemistry      Component Value Date/Time  NA 134* 10/21/2015 0804   NA 138 12/10/2014 1341   K 4.2 10/21/2015 0804   K 3.6 12/10/2014 1341   CL 99* 10/21/2015 0804   CL 102 12/10/2014 1341   CO2 28 10/21/2015 0804   CO2 30 12/10/2014 1341   BUN 18 10/21/2015 0804   BUN 21* 12/10/2014 1341   CREATININE 0.89 10/21/2015 0804   CREATININE 1.10 03/25/2015 1453      Component Value Date/Time   CALCIUM 9.4 10/21/2015 0804   CALCIUM 8.4* 12/10/2014 1341   ALKPHOS 42 10/21/2015 0804   ALKPHOS 50 12/10/2014 1341   AST 22 10/21/2015 0804   AST 17 12/10/2014 1341   ALT 18 10/21/2015 0804   ALT 23 12/10/2014 1341   BILITOT 0.7 10/21/2015 0804   BILITOT 0.5 12/10/2014 1341       RADIOGRAPHIC STUDIES: I have personally reviewed the radiological images as listed and agreed with the findings in the report. No results found.   ASSESSMENT & PLAN:   # Metastatic adenocarcinoma the lung- currently on maintenance Alimta/on protocol; June 2012. Clinically no concerns for obvious progression at this time. November 2016- CT scan-chest abdomen pelvis does not show any any evidence of recurrence/progression.   # Continue Alimta every 3 weeks. Patient is on folic PGDN/Q56 injections. CBC CMP reviewed unremarkable.  # Mild swelling in the legs- continues to be on Lasix/spirinolactone.    Follow up in approximately 3 weeks/prior to next cycle of chemotherapy. Independently reviewed the imaging. Patient was given a copy of the report.    No orders of the defined types were placed in  this encounter.   All questions were answered. The patient knows to call the clinic with any problems, questions or concerns. No barriers to learning was detected.    [name], MD 10/21/2015 12:38 PM

## 2015-11-04 ENCOUNTER — Telehealth: Payer: Self-pay | Admitting: *Deleted

## 2015-11-04 MED ORDER — FUROSEMIDE 20 MG PO TABS: 20.0000 mg | ORAL_TABLET | Freq: Every day | ORAL | Status: DC

## 2015-11-04 NOTE — Telephone Encounter (Signed)
Escribed

## 2015-11-11 ENCOUNTER — Inpatient Hospital Stay: Payer: BLUE CROSS/BLUE SHIELD

## 2015-11-11 ENCOUNTER — Inpatient Hospital Stay: Payer: BLUE CROSS/BLUE SHIELD | Attending: Internal Medicine | Admitting: Internal Medicine

## 2015-11-11 VITALS — BP 132/84 | HR 70 | Temp 96.2°F | Resp 18 | Wt 196.7 lb

## 2015-11-11 DIAGNOSIS — E871 Hypo-osmolality and hyponatremia: Secondary | ICD-10-CM

## 2015-11-11 DIAGNOSIS — D6959 Other secondary thrombocytopenia: Secondary | ICD-10-CM | POA: Diagnosis not present

## 2015-11-11 DIAGNOSIS — T451X5S Adverse effect of antineoplastic and immunosuppressive drugs, sequela: Secondary | ICD-10-CM

## 2015-11-11 DIAGNOSIS — C3432 Malignant neoplasm of lower lobe, left bronchus or lung: Secondary | ICD-10-CM | POA: Diagnosis not present

## 2015-11-11 DIAGNOSIS — C3490 Malignant neoplasm of unspecified part of unspecified bronchus or lung: Secondary | ICD-10-CM

## 2015-11-11 DIAGNOSIS — I38 Endocarditis, valve unspecified: Secondary | ICD-10-CM | POA: Diagnosis not present

## 2015-11-11 DIAGNOSIS — Z5111 Encounter for antineoplastic chemotherapy: Secondary | ICD-10-CM | POA: Diagnosis present

## 2015-11-11 DIAGNOSIS — Z79899 Other long term (current) drug therapy: Secondary | ICD-10-CM | POA: Diagnosis not present

## 2015-11-11 DIAGNOSIS — C3492 Malignant neoplasm of unspecified part of left bronchus or lung: Secondary | ICD-10-CM | POA: Insufficient documentation

## 2015-11-11 DIAGNOSIS — F17211 Nicotine dependence, cigarettes, in remission: Secondary | ICD-10-CM | POA: Diagnosis not present

## 2015-11-11 DIAGNOSIS — R600 Localized edema: Secondary | ICD-10-CM | POA: Diagnosis not present

## 2015-11-11 DIAGNOSIS — Z006 Encounter for examination for normal comparison and control in clinical research program: Secondary | ICD-10-CM | POA: Insufficient documentation

## 2015-11-11 LAB — COMPREHENSIVE METABOLIC PANEL
ALT: 17 U/L (ref 17–63)
ANION GAP: 8 (ref 5–15)
AST: 24 U/L (ref 15–41)
Albumin: 4.2 g/dL (ref 3.5–5.0)
Alkaline Phosphatase: 43 U/L (ref 38–126)
BILIRUBIN TOTAL: 0.6 mg/dL (ref 0.3–1.2)
BUN: 17 mg/dL (ref 6–20)
CALCIUM: 9.9 mg/dL (ref 8.9–10.3)
CHLORIDE: 93 mmol/L — AB (ref 101–111)
CO2: 31 mmol/L (ref 22–32)
CREATININE: 0.99 mg/dL (ref 0.61–1.24)
Glucose, Bld: 125 mg/dL — ABNORMAL HIGH (ref 65–99)
Potassium: 4.1 mmol/L (ref 3.5–5.1)
Sodium: 132 mmol/L — ABNORMAL LOW (ref 135–145)
Total Protein: 7.5 g/dL (ref 6.5–8.1)

## 2015-11-11 LAB — CBC WITH DIFFERENTIAL/PLATELET
Basophils Absolute: 0 10*3/uL (ref 0–0.1)
Basophils Relative: 1 %
EOS PCT: 2 %
Eosinophils Absolute: 0.1 10*3/uL (ref 0–0.7)
HCT: 44 % (ref 40.0–52.0)
Hemoglobin: 14.9 g/dL (ref 13.0–18.0)
LYMPHS ABS: 1.1 10*3/uL (ref 1.0–3.6)
LYMPHS PCT: 16 %
MCH: 32 pg (ref 26.0–34.0)
MCHC: 33.8 g/dL (ref 32.0–36.0)
MCV: 94.8 fL (ref 80.0–100.0)
MONO ABS: 0.7 10*3/uL (ref 0.2–1.0)
Monocytes Relative: 10 %
Neutro Abs: 4.8 10*3/uL (ref 1.4–6.5)
Neutrophils Relative %: 71 %
PLATELETS: 146 10*3/uL — AB (ref 150–440)
RBC: 4.65 MIL/uL (ref 4.40–5.90)
RDW: 13.6 % (ref 11.5–14.5)
WBC: 6.7 10*3/uL (ref 3.8–10.6)

## 2015-11-11 MED ORDER — SODIUM CHLORIDE 0.9 % IV SOLN
Freq: Once | INTRAVENOUS | Status: AC
  Administered 2015-11-11: 15:00:00 via INTRAVENOUS
  Filled 2015-11-11: qty 4

## 2015-11-11 MED ORDER — SODIUM CHLORIDE 0.9 % IV SOLN
Freq: Once | INTRAVENOUS | Status: AC
  Administered 2015-11-11: 15:00:00 via INTRAVENOUS
  Filled 2015-11-11: qty 1000

## 2015-11-11 MED ORDER — HEPARIN SOD (PORK) LOCK FLUSH 100 UNIT/ML IV SOLN
500.0000 [IU] | Freq: Once | INTRAVENOUS | Status: AC | PRN
  Administered 2015-11-11: 500 [IU]
  Filled 2015-11-11: qty 5

## 2015-11-11 MED ORDER — SODIUM CHLORIDE 0.9 % IV SOLN
500.0000 mg/m2 | Freq: Once | INTRAVENOUS | Status: AC
  Administered 2015-11-11: 1050 mg via INTRAVENOUS
  Filled 2015-11-11: qty 42

## 2015-11-11 NOTE — Progress Notes (Signed)
[facility] OFFICE PROGRESS NOTE  Patient Care Team: [name], MD as PCP - General (Family Medicine)   SUMMARY OF ONCOLOGIC HISTORY:  # 2012- METASTATIC ADENO CA of LEFT LUNG [acinar pattern] s/p MED LN Bx [NEG- EGFR/K-ras/ALK mutation];[name]; [name]; CT NOV 2016- NED   INTERVAL HISTORY: A pleasant 54 year old [place] male patient with above history of metastatic adenocarcinoma the lung currently on maintenance Alimta since June 2012 is here for follow-up.  Patient doing well overall. He has intermittent swelling in the legs which is improved on diuretics. He denies any skin rash.  Patient denies any headaches or Vision changes. Appetite is good. Denies any weight loss.  REVIEW OF SYSTEMS:  A complete 10 point review of system is done which is negative except mentioned above/history of present illness.   PAST MEDICAL HISTORY :  Past Medical History  Diagnosis Date  . Allergy   . Mild valvular heart disease Nov. 2014    per 2 D Echocardiogram done for persistent leg edema, monitored by cardiology  . Lung cancer ([place])   . Peripheral edema     PAST SURGICAL HISTORY :   Past Surgical History  Procedure Laterality Date  . Elbow surgery    . Left anterior thoracotomy with biopsy  March 2012  . Portacath placement  March 2012    FAMILY HISTORY :   Family History  Problem Relation Age of Onset  . Leukemia Father     SOCIAL HISTORY:   Social History  Substance Use Topics  . Smoking status: Former Smoker    Types: Cigarettes  . Smokeless tobacco: Never Used  . Alcohol Use: 0.0 oz/week    0 Standard drinks or equivalent per week     Comment: "social drinker"     ALLERGIES:  is allergic to penicillins.  MEDICATIONS:  Current Outpatient Prescriptions  Medication Sig Dispense Refill  . folic acid (FOLVITE) 1 MG tablet Take 1 tablet (1 mg  total) by mouth daily. 30 tablet 6  . furosemide (LASIX) 20 MG tablet Take 1 tablet (20 mg total) by mouth daily. One tablet daily as needed for swelling 30 tablet 2  . glucosamine-chondroitin 500-400 MG tablet Take 1 tablet by mouth daily.    . meclizine (ANTIVERT) 25 MG tablet Take 1 tablet (25 mg total) by mouth 3 (three) times daily as needed for dizziness. 60 tablet 0  . meloxicam (MOBIC) 7.5 MG tablet Take 7.5 mg by mouth daily.    . Multiple Vitamin (MULTIVITAMIN) capsule Take 1 capsule by mouth daily.    . Omega-3 Fatty Acids (FISH OIL) 1000 MG CAPS Take 1 capsule by mouth daily.    . ondansetron (ZOFRAN-ODT) 4 MG disintegrating tablet Take 4 mg by mouth every 4 (four) hours as needed for nausea or vomiting.    . senna (SENOKOT) 8.6 MG tablet Take 1 tablet by mouth daily. Take as needed around time of chemotherapy    . spironolactone (ALDACTONE) 25 MG tablet Take 1 tablet (25 mg total) by mouth daily. 90 tablet 1  . vitamin B-12 (CYANOCOBALAMIN) 1000 MCG tablet Take 1,000 mcg by mouth daily.     No current facility-administered medications for this visit.   Facility-Administered Medications Ordered in Other Visits  Medication Dose Route Frequency Provider Last Rate Last Dose  . sodium chloride 0.9 % injection 10 mL  10 mL Intracatheter PRN [name], MD  10 mL at 05/27/15 1543  . sodium chloride 0.9 % injection 10 mL  10 mL Intracatheter PRN Sandeep Pandit, MD   10 mL at 06/17/15 1510  . sodium chloride 0.9 % injection 10 mL  10 mL Intravenous PRN Janak Choksi, MD   10 mL at 07/08/15 1340    PHYSICAL EXAMINATION: ECOG PERFORMANCE STATUS: 0 - Asymptomatic  BP 132/84 mmHg  Pulse 70  Temp(Src) 96.2 F (35.7 C) (Tympanic)  Resp 18  Wt 196 lb 10.4 oz (89.2 kg)  Filed Weights   11/11/15 1354  Weight: 196 lb 10.4 oz (89.2 kg)    GENERAL: Well-nourished well-developed; Alert, no distress and comfortable. [name]. EYES: no pallor or icterus OROPHARYNX: no thrush or  ulceration; good dentition  NECK: supple, [name], [name]; No lower extremity edema ABDOMEN:abdomen soft, [name]  LABORATORY DATA:  I have reviewed the data as listed    Component Value Date/Time   NA 132* 11/11/2015 1023   NA 138 12/10/2014 1341   K 4.1 11/11/2015 1023   K 3.6 12/10/2014 1341   CL 93* 11/11/2015 1023   CL 102 12/10/2014 1341   CO2 31 11/11/2015 1023   CO2 30 12/10/2014 1341   GLUCOSE 125* 11/11/2015 1023   GLUCOSE 160* 12/10/2014 1341   BUN 17 11/11/2015 1023   BUN 21* 12/10/2014 1341   CREATININE 0.99 11/11/2015 1023   CREATININE 1.10 03/25/2015 1453   CALCIUM 9.9 11/11/2015 1023   CALCIUM 8.4* 12/10/2014 1341   PROT 7.5 11/11/2015 1023   PROT 6.6 12/10/2014 1341   ALBUMIN 4.2 11/11/2015 1023   ALBUMIN 3.4 12/10/2014 1341   AST 24 11/11/2015 1023   AST 17 12/10/2014 1341   ALT 17 11/11/2015 1023   ALT 23 12/10/2014 1341   ALKPHOS 43 11/11/2015 1023   ALKPHOS 50 12/10/2014 1341   BILITOT 0.6 11/11/2015 1023   BILITOT 0.5 12/10/2014 1341   GFRNONAA >60 11/11/2015 1023   GFRNONAA >60 03/25/2015 1453   GFRNONAA >60 12/31/2014 1346   GFRAA >60 11/11/2015 1023   GFRAA >60 03/25/2015 1453   GFRAA >60 12/31/2014 1346    No results found for: SPEP, UPEP  Lab Results  Component Value Date   WBC 6.7 11/11/2015   NEUTROABS 4.8 11/11/2015   HGB 14.9 11/11/2015   HCT 44.0 11/11/2015   MCV 94.8 11/11/2015   PLT 146* 11/11/2015      Chemistry      Component Value Date/Time   NA 132* 11/11/2015 1023   NA 138 12/10/2014 1341   K 4.1 11/11/2015 1023   K 3.6  12/10/2014 1341   CL 93* 11/11/2015 1023   CL 102 12/10/2014 1341   CO2 31 11/11/2015 1023   CO2 30 12/10/2014 1341   BUN 17 11/11/2015 1023   BUN 21* 12/10/2014 1341   CREATININE 0.99 11/11/2015 1023   CREATININE 1.10 03/25/2015 1453      Component Value Date/Time   CALCIUM 9.9 11/11/2015 1023   CALCIUM 8.4* 12/10/2014 1341   ALKPHOS 43 11/11/2015 1023   ALKPHOS 50 12/10/2014 1341   AST 24 11/11/2015 1023     AST 17 12/10/2014 1341   ALT 17 11/11/2015 1023   ALT 23 12/10/2014 1341   BILITOT 0.6 11/11/2015 1023   BILITOT 0.5 12/10/2014 1341       RADIOGRAPHIC STUDIES: I have personally reviewed the radiological images as listed and agreed with the findings in the report. No results found.   ASSESSMENT & PLAN:   # Metastatic adenocarcinoma the lung- currently on maintenance Alimta/on protocol; June 2012. Clinically no concerns for obvious progression at this time. November 2016- CT scan-chest abdomen pelvis does not show any any evidence of recurrence/progression.   #patient tolerating chemotherapy very well. Continue Alimta every 3 weeks. Patient is on folic VQQV/Z56 injections. CBC within normal limits except for mild thrombocytopenia platelet count of 147. CMP reviewed unremarkable-except for mild hyponatremia of 133 sodium.  # Mild swelling in the legs- continues to be on Lasix/spirinolactone.   # Thrombocytopenia grade 1. No changes to chemotherapy.   Follow up in approximately 3 weeks/prior to next cycle of chemotherapy/ with covering physician; follow-up with me in 6 weeks. Reviewed the plan with the research team.   No orders of the defined types were placed in this encounter.   All questions were answered. The patient knows to call the clinic with any problems, questions or concerns. No barriers to learning was detected.    [name], MD 11/11/2015 2:11 PM

## 2015-11-11 NOTE — Progress Notes (Signed)
11/11/2015  @  14:05pm  Mr. Godeaux returns to clinic today for consideration of cycle #578 infusion of Alimta on the [employer] research study. Patient reports he is doing well and denies any new side effects from the study drug. Patient continues to experience bilateral leg edema grade 1 related to study drug, which is not new and for which he takes Lasix and Spironolactone. Weight and VS stable, labs from today showed platelets 147K , sodium 132, chloride 93. Dr. Rogue Bussing aware and will continue to follow. Performance status remains 0. Patient will receive treatment as planned. Next cycle planned for 12/02/15 and will receive Vitamin B12 at that time.              Edema in bilateral lower extremities - grade 1; likely related to Alimta Hyponatremia - grade 1; likely related to Alimta

## 2015-11-15 ENCOUNTER — Telehealth: Payer: Self-pay | Admitting: *Deleted

## 2015-11-15 NOTE — Telephone Encounter (Addendum)
Caled to inquire what he should take for a cold, is something OTC OK and what?  Attempted to call patient back x 3. No answer on phone. Dr Rogue Bussing said pt could take any OTC cold medicine,sudofed or whatever is available. Pt to call us if he developes a fever of 101

## 2015-11-18 ENCOUNTER — Telehealth: Payer: Self-pay | Admitting: *Deleted

## 2015-11-18 MED ORDER — FOLIC ACID 1 MG PO TABS: 1.0000 mg | ORAL_TABLET | Freq: Every day | ORAL | Status: DC

## 2015-11-18 NOTE — Telephone Encounter (Signed)
Escribed

## 2015-12-02 ENCOUNTER — Inpatient Hospital Stay: Payer: BLUE CROSS/BLUE SHIELD

## 2015-12-02 ENCOUNTER — Inpatient Hospital Stay (HOSPITAL_BASED_OUTPATIENT_CLINIC_OR_DEPARTMENT_OTHER): Payer: BLUE CROSS/BLUE SHIELD | Admitting: Family Medicine

## 2015-12-02 ENCOUNTER — Encounter: Payer: Self-pay | Admitting: Family Medicine

## 2015-12-02 ENCOUNTER — Encounter: Payer: Self-pay | Admitting: Oncology

## 2015-12-02 VITALS — HR 76 | Temp 97.6°F | Resp 18 | Ht 70.0 in | Wt 196.2 lb

## 2015-12-02 DIAGNOSIS — R600 Localized edema: Secondary | ICD-10-CM

## 2015-12-02 DIAGNOSIS — E871 Hypo-osmolality and hyponatremia: Secondary | ICD-10-CM

## 2015-12-02 DIAGNOSIS — Z006 Encounter for examination for normal comparison and control in clinical research program: Secondary | ICD-10-CM | POA: Diagnosis not present

## 2015-12-02 DIAGNOSIS — T451X5S Adverse effect of antineoplastic and immunosuppressive drugs, sequela: Secondary | ICD-10-CM

## 2015-12-02 DIAGNOSIS — C3491 Malignant neoplasm of unspecified part of right bronchus or lung: Secondary | ICD-10-CM

## 2015-12-02 DIAGNOSIS — D6959 Other secondary thrombocytopenia: Secondary | ICD-10-CM

## 2015-12-02 DIAGNOSIS — C3492 Malignant neoplasm of unspecified part of left bronchus or lung: Secondary | ICD-10-CM

## 2015-12-02 DIAGNOSIS — Z5111 Encounter for antineoplastic chemotherapy: Secondary | ICD-10-CM | POA: Diagnosis not present

## 2015-12-02 DIAGNOSIS — C3490 Malignant neoplasm of unspecified part of unspecified bronchus or lung: Secondary | ICD-10-CM

## 2015-12-02 DIAGNOSIS — F17211 Nicotine dependence, cigarettes, in remission: Secondary | ICD-10-CM

## 2015-12-02 DIAGNOSIS — I38 Endocarditis, valve unspecified: Secondary | ICD-10-CM

## 2015-12-02 DIAGNOSIS — Z79899 Other long term (current) drug therapy: Secondary | ICD-10-CM

## 2015-12-02 LAB — COMPREHENSIVE METABOLIC PANEL
ALK PHOS: 47 U/L (ref 38–126)
ALT: 16 U/L — AB (ref 17–63)
AST: 22 U/L (ref 15–41)
Albumin: 3.8 g/dL (ref 3.5–5.0)
Anion gap: 7 (ref 5–15)
BILIRUBIN TOTAL: 1 mg/dL (ref 0.3–1.2)
BUN: 20 mg/dL (ref 6–20)
CALCIUM: 9.3 mg/dL (ref 8.9–10.3)
CO2: 28 mmol/L (ref 22–32)
CREATININE: 1.2 mg/dL (ref 0.61–1.24)
Chloride: 96 mmol/L — ABNORMAL LOW (ref 101–111)
GFR calc Af Amer: >60 mL/min (ref >60)
Glucose, Bld: 167 mg/dL — ABNORMAL HIGH (ref 65–99)
Potassium: 3.8 mmol/L (ref 3.5–5.1)
Sodium: 131 mmol/L — ABNORMAL LOW (ref 135–145)
TOTAL PROTEIN: 7.2 g/dL (ref 6.5–8.1)

## 2015-12-02 LAB — CBC WITH DIFFERENTIAL/PLATELET
Basophils Absolute: 0 10*3/uL (ref 0–0.1)
Basophils Relative: 1 %
Eosinophils Absolute: 0.1 10*3/uL (ref 0–0.7)
Eosinophils Relative: 1 %
HEMATOCRIT: 40.9 % (ref 40.0–52.0)
Hemoglobin: 14.2 g/dL (ref 13.0–18.0)
LYMPHS PCT: 16 %
Lymphs Abs: 1 10*3/uL (ref 1.0–3.6)
MCH: 32.6 pg (ref 26.0–34.0)
MCHC: 34.8 g/dL (ref 32.0–36.0)
MCV: 93.6 fL (ref 80.0–100.0)
MONO ABS: 0.5 10*3/uL (ref 0.2–1.0)
MONOS PCT: 9 %
NEUTROS ABS: 4.6 10*3/uL (ref 1.4–6.5)
Neutrophils Relative %: 73 %
Platelets: 166 10*3/uL (ref 150–440)
RBC: 4.37 MIL/uL — ABNORMAL LOW (ref 4.40–5.90)
RDW: 13 % (ref 11.5–14.5)
WBC: 6.2 10*3/uL (ref 3.8–10.6)

## 2015-12-02 LAB — PROTEIN / CREATININE RATIO, URINE
CREATININE, URINE: 77 mg/dL
Total Protein, Urine: <6 mg/dL

## 2015-12-02 MED ORDER — CYANOCOBALAMIN 1000 MCG/ML IJ SOLN
1000.0000 ug | Freq: Once | INTRAMUSCULAR | Status: AC
  Administered 2015-12-02: 1000 ug via INTRAMUSCULAR
  Filled 2015-12-02: qty 1

## 2015-12-02 MED ORDER — AZITHROMYCIN 250 MG PO TABS: 250.0000 mg | ORAL_TABLET | Freq: Every day | ORAL | Status: DC

## 2015-12-02 MED ORDER — SPIRONOLACTONE 25 MG PO TABS: 25.0000 mg | ORAL_TABLET | Freq: Every day | ORAL | Status: DC

## 2015-12-02 MED ORDER — HEPARIN SOD (PORK) LOCK FLUSH 100 UNIT/ML IV SOLN
500.0000 [IU] | Freq: Once | INTRAVENOUS | Status: AC | PRN
  Administered 2015-12-02: 500 [IU]
  Filled 2015-12-02: qty 5

## 2015-12-02 MED ORDER — FUROSEMIDE 20 MG PO TABS: 20.0000 mg | ORAL_TABLET | Freq: Every day | ORAL | Status: DC

## 2015-12-02 MED ORDER — SODIUM CHLORIDE 0.9 % IV SOLN
Freq: Once | INTRAVENOUS | Status: AC
  Administered 2015-12-02: 11:00:00 via INTRAVENOUS
  Filled 2015-12-02: qty 1000

## 2015-12-02 MED ORDER — ONDANSETRON 4 MG PO TBDP: 4.0000 mg | ORAL_TABLET | ORAL | Status: DC | PRN

## 2015-12-02 MED ORDER — SODIUM CHLORIDE 0.9 % IV SOLN
Freq: Once | INTRAVENOUS | Status: AC
  Administered 2015-12-02: 11:00:00 via INTRAVENOUS
  Filled 2015-12-02: qty 4

## 2015-12-02 MED ORDER — SODIUM CHLORIDE 0.9 % IV SOLN
500.0000 mg/m2 | Freq: Once | INTRAVENOUS | Status: AC
  Administered 2015-12-02: 1050 mg via INTRAVENOUS
  Filled 2015-12-02: qty 42

## 2015-12-02 NOTE — Progress Notes (Signed)
[facility] OFFICE PROGRESS NOTE  Patient Care Team: [name], MD as PCP - General (Family Medicine)   SUMMARY OF ONCOLOGIC HISTORY:  2012- METASTATIC ADENO CA of LEFT LUNG [acinar pattern] s/p MED LN Bx [NEG- EGFR/K-ras/ALK mutation];[name]; [name]; CT NOV 2016- NED   INTERVAL HISTORY: Patient is here for continued follow-up and treatment consideration regarding metastatic adenocarcinoma of the left lung. He is currently on maintenance Alimta since June 2012 under [facility] protocol. His most recent CT scan in November 2016 showed no evidence of recurrent or progressive disease. Patient doing well overall. He has intermittent swelling in the legs which is improved on diuretics. He has some fluid buildup around the knuckle of the second finger of the right hand. Most likely cystic and discussed with patient seeing an orthopedic doctor that may be able to drain.  He has also been having some chest congestion, he has taken over-the-counter Sudafed as well as Mucinex with improvement. He stopped Mucinex several days ago and reports that congestion has returned in the chest and sputum is now green. He denies any fever, chills, shortness of breath, or chest pain. Otherwise he is feeling very well and denies any other acute complaints.  REVIEW OF SYSTEMS:  A complete 10 point review of system is done which is negative except mentioned above/history of present illness.   PAST MEDICAL HISTORY :  Past Medical History  Diagnosis Date  . Allergy   . Mild valvular heart disease Nov. 2014    per 2 D Echocardiogram done for persistent leg edema, monitored by cardiology  . Lung cancer ([place])   . Peripheral edema     PAST SURGICAL HISTORY :   Past Surgical History  Procedure Laterality Date  . Elbow surgery    . Left anterior thoracotomy with biopsy  March 2012  . Portacath  placement  March 2012    FAMILY HISTORY :   Family History  Problem Relation Age of Onset  . Leukemia Father     SOCIAL HISTORY:   Social History  Substance Use Topics  . Smoking status: Former Smoker    Types: Cigarettes  . Smokeless tobacco: Never Used  . Alcohol Use: 0.0 oz/week    0 Standard drinks or equivalent per week     Comment: "social drinker"     ALLERGIES:  is allergic to penicillins.  MEDICATIONS:  Current Outpatient Prescriptions  Medication Sig Dispense Refill  . folic acid (FOLVITE) 1 MG tablet Take 1 tablet (1 mg total) by mouth daily. 30 tablet 6  . furosemide (LASIX) 20 MG tablet Take 1 tablet (20 mg total) by mouth daily. One tablet daily as needed for swelling 30 tablet 2  . glucosamine-chondroitin 500-400 MG tablet Take 1 tablet by mouth daily.    . meclizine (ANTIVERT) 25 MG tablet Take 1 tablet (25 mg total) by mouth 3 (three) times daily as needed for dizziness. 60 tablet 0  . meloxicam (MOBIC) 7.5 MG tablet Take 7.5 mg by mouth daily.    . Multiple Vitamin (MULTIVITAMIN) capsule Take 1 capsule by mouth daily.    . Omega-3 Fatty Acids (FISH OIL) 1000 MG CAPS Take 1 capsule by mouth daily.    . ondansetron (ZOFRAN-ODT) 4 MG disintegrating tablet Take 4 mg by mouth every 4 (four) hours as needed for nausea or vomiting.    . senna (SENOKOT) 8.6 MG tablet Take 1 tablet  by mouth daily. Take as needed around time of chemotherapy    . spironolactone (ALDACTONE) 25 MG tablet Take 1 tablet (25 mg total) by mouth daily. 90 tablet 1  . vitamin B-12 (CYANOCOBALAMIN) 1000 MCG tablet Take 1,000 mcg by mouth daily.    Marland Kitchen azithromycin (ZITHROMAX) 250 MG tablet Take 1 tablet (250 mg total) by mouth daily. 6 each 0   No current facility-administered medications for this visit.   Facility-Administered Medications Ordered in Other Visits  Medication Dose Route Frequency Provider Last Rate Last Dose  . 0.9 %  sodium chloride infusion   Intravenous Once Evlyn Kanner,  NP      . cyanocobalamin ((VITAMIN B-12)) injection 1,000 mcg  1,000 mcg Intramuscular Once Evlyn Kanner, NP      . heparin lock flush 100 unit/mL  500 Units Intracatheter Once PRN Evlyn Kanner, NP      . ondansetron (ZOFRAN) 8 mg, dexamethasone (DECADRON) 10 mg in sodium chloride 0.9 % 50 mL IVPB   Intravenous Once Evlyn Kanner, NP      . sodium chloride 0.9 % injection 10 mL  10 mL Intracatheter PRN [name], MD   10 mL at 05/27/15 1543  . sodium chloride 0.9 % injection 10 mL  10 mL Intracatheter PRN [name], MD   10 mL at 06/17/15 1510  . sodium chloride 0.9 % injection 10 mL  10 mL Intravenous PRN [name], MD   10 mL at 07/08/15 1340    PHYSICAL EXAMINATION: ECOG PERFORMANCE STATUS: 0 - Asymptomatic  Pulse 76  Temp(Src) 97.6 F (36.4 C) (Tympanic)  Resp 18  Ht 5' 10" (1.778 m)  Wt 196 lb 3.4 oz (89 kg)  BMI 28.15 kg/m2  Filed Weights   12/02/15 1033  Weight: 196 lb 3.4 oz (89 kg)    GENERAL: Well-nourished well-developed; Alert, no distress and comfortable.  EYES: no pallor or icterus OROPHARYNX: no thrush or ulceration; good dentition  NECK: supple, [name], axillary or inguinal regions LUNGS: clear to auscultation. No wheeze or crackles HEART/CVS: regular rate & rhythm and no murmurs; No lower extremity edema ABDOMEN: abdomen soft, non-tender and normal bowel sounds Musculoskeletal: no cyanosis of digits and no clubbing  PSYCH: alert & oriented x 3 with fluent speech NEURO: no focal motor/sensory deficits SKIN:  no rashes or significant lesions  LABORATORY DATA:  I have reviewed the data as listed    Component Value Date/Time   NA 131* 12/02/2015 0816   NA 138 12/10/2014 1341   K 3.8 12/02/2015 0816   K 3.6 12/10/2014 1341   CL 96* 12/02/2015 0816   CL 102 12/10/2014 1341   CO2 28 12/02/2015 0816   CO2 30 12/10/2014 1341   GLUCOSE 167* 12/02/2015 0816   GLUCOSE 160*  12/10/2014 1341   BUN 20 12/02/2015 0816   BUN 21* 12/10/2014 1341   CREATININE 1.20 12/02/2015 0816   CREATININE 1.10 03/25/2015 1453   CALCIUM 9.3 12/02/2015 0816   CALCIUM 8.4* 12/10/2014 1341   PROT 7.2 12/02/2015 0816   PROT 6.6 12/10/2014 1341   ALBUMIN 3.8 12/02/2015 0816   ALBUMIN 3.4 12/10/2014 1341   AST 22 12/02/2015 0816   AST 17 12/10/2014 1341   ALT 16* 12/02/2015 0816   ALT 23 12/10/2014 1341   ALKPHOS 47 12/02/2015 0816   ALKPHOS 50 12/10/2014 1341   BILITOT 1.0 12/02/2015 0816   BILITOT 0.5 12/10/2014  [place] 12/02/2015 0816   GFRNONAA >60 03/25/2015 1453   GFRNONAA >60 12/31/2014 1346   GFRAA >60 12/02/2015 0816   GFRAA >60 03/25/2015 1453   GFRAA >60 12/31/2014 1346    No results found for: SPEP, UPEP  Lab Results  Component Value Date   WBC 6.2 12/02/2015   NEUTROABS 4.6 12/02/2015   HGB 14.2 12/02/2015   HCT 40.9 12/02/2015   MCV 93.6 12/02/2015   PLT 166 12/02/2015      Chemistry      Component Value Date/Time   NA 131* 12/02/2015 0816   NA 138 12/10/2014 1341   K 3.8 12/02/2015 0816   K 3.6 12/10/2014 1341   CL 96* 12/02/2015 0816   CL 102 12/10/2014 1341   CO2 28 12/02/2015 0816   CO2 30 12/10/2014 1341   BUN 20 12/02/2015 0816   BUN 21* 12/10/2014 1341   CREATININE 1.20 12/02/2015 0816   CREATININE 1.10 03/25/2015 1453      Component Value Date/Time   CALCIUM 9.3 12/02/2015 0816   CALCIUM 8.4* 12/10/2014 1341   ALKPHOS 47 12/02/2015 0816   ALKPHOS 50 12/10/2014 1341   AST 22 12/02/2015 0816   AST 17 12/10/2014 1341   ALT 16* 12/02/2015 0816   ALT 23 12/10/2014 1341   BILITOT 1.0 12/02/2015 0816   BILITOT 0.5 12/10/2014 1341       RADIOGRAPHIC STUDIES: I have personally reviewed the radiological images as listed and agreed with the findings in the report. No results found.   ASSESSMENT & PLAN:   # Metastatic adenocarcinoma the lung- currently on maintenance Alimta/on protocol; June 2012. Clinically no  concerns for obvious progression at this time. November 2016- CT scan-chest abdomen pelvis does not show any evidence of recurrence/progression. Patient is tolerating chemotherapy very well, will continue with Alimta as per protocol every 3 weeks.   # Mild swelling in the legs- continue use of Lasix/spirinolactone.   Chest congestion/upper respiratory infection. Chest congestion and has been going on for several days now with use of over-the-counter Mucinex and Sudafed there was some improvement. Patient has since stopped those and chest congestion has returned with now green sputum. We will start five-day azithromycin. Patient advised to call us if fever, chills, or worsening cough.   Follow up in approximately 3 weeks/prior to next cycle of chemotherapy with Dr. Rogue Bussing.   All questions were answered. The patient knows to call the clinic with any problems, questions or concerns. No barriers to learning was detected.    Evlyn Kanner, NP 12/02/2015 10:53 AM

## 2015-12-02 NOTE — Progress Notes (Signed)
12/02/2015 @ 10:30am   [facility] returns to clinic today for consideration of cycle #579 infusion of Alimta on the [employer] research study. Patient reports he is doing well. Reports he developed a cyst like knot on right hand second knuckle prior to last visit (11/11/15) but has no pain. Recently had congestion and took Sudafed and Mucinex. Patient reports chest congestion with green sputum has returned.  CBC and chemistries all within acceptable parameters.  Georgeanne Nim NP reviewed all with patient and recommended he see Orthopedist. Antibiotic, Z-pack, will be sent to pharmacy to treat chest congestion per Georgeanne Nim NP.   Patient continues to experience improved bilateral leg edema grade 1 related to study drug, which is not new and for which he takes Lasix and Spironolactone. Weight and VS stable today.  Weight is 89.0 kg. He will receive treatment as planned and B12 injection today. Patient will return in 3 weeks on December 23, 2015 to see Dr. Rogue Bussing for next cycle.  Adverse events and grade attributions below:               Edema in bilateral lower extremities - grade 1; likely related to Alimta Hyponatremia - grade 1; likely related to Alimta  Cyst right hand 2nd knuckle- grade 1, unlikely related to Alimta   Chest congestion/upper respiratory infection- grade 2, unlikely related to Alimta

## 2015-12-02 NOTE — Progress Notes (Signed)
Pt states swelling is better in lower ext.  Pt feels it is diet related.  He has a cyst like looking nodule at 2nd knuckle of right hand that he is concerned about. Eating and drinking.  He has had chest congestion and was taking mucinex and it got better and then stopped it and the congestion is better. Coughs up green sputum.

## 2015-12-23 ENCOUNTER — Inpatient Hospital Stay: Payer: BLUE CROSS/BLUE SHIELD | Attending: Internal Medicine | Admitting: Internal Medicine

## 2015-12-23 ENCOUNTER — Inpatient Hospital Stay: Payer: BLUE CROSS/BLUE SHIELD

## 2015-12-23 VITALS — BP 131/85 | HR 75 | Temp 97.5°F | Resp 18 | Ht 70.0 in | Wt 193.1 lb

## 2015-12-23 DIAGNOSIS — C3491 Malignant neoplasm of unspecified part of right bronchus or lung: Secondary | ICD-10-CM

## 2015-12-23 DIAGNOSIS — R600 Localized edema: Secondary | ICD-10-CM | POA: Diagnosis not present

## 2015-12-23 DIAGNOSIS — C3492 Malignant neoplasm of unspecified part of left bronchus or lung: Secondary | ICD-10-CM | POA: Insufficient documentation

## 2015-12-23 DIAGNOSIS — I38 Endocarditis, valve unspecified: Secondary | ICD-10-CM | POA: Diagnosis not present

## 2015-12-23 DIAGNOSIS — Z79899 Other long term (current) drug therapy: Secondary | ICD-10-CM | POA: Insufficient documentation

## 2015-12-23 DIAGNOSIS — Z006 Encounter for examination for normal comparison and control in clinical research program: Secondary | ICD-10-CM | POA: Diagnosis not present

## 2015-12-23 DIAGNOSIS — Z5111 Encounter for antineoplastic chemotherapy: Secondary | ICD-10-CM | POA: Diagnosis present

## 2015-12-23 DIAGNOSIS — C3490 Malignant neoplasm of unspecified part of unspecified bronchus or lung: Secondary | ICD-10-CM

## 2015-12-23 DIAGNOSIS — Z87891 Personal history of nicotine dependence: Secondary | ICD-10-CM | POA: Insufficient documentation

## 2015-12-23 DIAGNOSIS — C3432 Malignant neoplasm of lower lobe, left bronchus or lung: Secondary | ICD-10-CM | POA: Diagnosis not present

## 2015-12-23 LAB — CBC WITH DIFFERENTIAL/PLATELET
BASOS PCT: 1 %
Basophils Absolute: 0 10*3/uL (ref 0–0.1)
EOS ABS: 0.1 10*3/uL (ref 0–0.7)
Eosinophils Relative: 3 %
HCT: 40.2 % (ref 40.0–52.0)
HEMOGLOBIN: 13.7 g/dL (ref 13.0–18.0)
Lymphocytes Relative: 32 %
Lymphs Abs: 1.7 10*3/uL (ref 1.0–3.6)
MCH: 31.7 pg (ref 26.0–34.0)
MCHC: 34 g/dL (ref 32.0–36.0)
MCV: 93 fL (ref 80.0–100.0)
Monocytes Absolute: 0.6 10*3/uL (ref 0.2–1.0)
Monocytes Relative: 11 %
NEUTROS PCT: 53 %
Neutro Abs: 2.8 10*3/uL (ref 1.4–6.5)
Platelets: 165 10*3/uL (ref 150–440)
RBC: 4.33 MIL/uL — AB (ref 4.40–5.90)
RDW: 13.2 % (ref 11.5–14.5)
WBC: 5.2 10*3/uL (ref 3.8–10.6)

## 2015-12-23 LAB — CREATININE, URINE, RANDOM: CREATININE, URINE: 31 mg/dL

## 2015-12-23 LAB — COMPREHENSIVE METABOLIC PANEL
ALBUMIN: 3.8 g/dL (ref 3.5–5.0)
ALK PHOS: 43 U/L (ref 38–126)
ALT: 19 U/L (ref 17–63)
ANION GAP: 5 (ref 5–15)
AST: 26 U/L (ref 15–41)
BUN: 18 mg/dL (ref 6–20)
CALCIUM: 9.2 mg/dL (ref 8.9–10.3)
CHLORIDE: 99 mmol/L — AB (ref 101–111)
CO2: 29 mmol/L (ref 22–32)
Creatinine, Ser: 0.95 mg/dL (ref 0.61–1.24)
GFR calc Af Amer: >60 mL/min (ref >60)
GFR calc non Af Amer: >60 mL/min (ref >60)
GLUCOSE: 106 mg/dL — AB (ref 65–99)
Potassium: 3.6 mmol/L (ref 3.5–5.1)
SODIUM: 133 mmol/L — AB (ref 135–145)
Total Bilirubin: 0.8 mg/dL (ref 0.3–1.2)
Total Protein: 6.9 g/dL (ref 6.5–8.1)

## 2015-12-23 LAB — PROTEIN, URINE, RANDOM: Total Protein, Urine: <6 mg/dL

## 2015-12-23 LAB — MAGNESIUM: Magnesium: 2 mg/dL (ref 1.7–2.4)

## 2015-12-23 MED ORDER — SODIUM CHLORIDE 0.9 % IV SOLN
Freq: Once | INTRAVENOUS | Status: AC
  Administered 2015-12-23: 15:00:00 via INTRAVENOUS
  Filled 2015-12-23: qty 4

## 2015-12-23 MED ORDER — SODIUM CHLORIDE 0.9 % IJ SOLN
10.0000 mL | INTRAMUSCULAR | Status: DC | PRN
  Administered 2015-12-23: 10 mL via INTRAVENOUS
  Filled 2015-12-23: qty 10

## 2015-12-23 MED ORDER — SODIUM CHLORIDE 0.9 % IV SOLN
500.0000 mg/m2 | Freq: Once | INTRAVENOUS | Status: AC
  Administered 2015-12-23: 1050 mg via INTRAVENOUS
  Filled 2015-12-23: qty 42

## 2015-12-23 MED ORDER — SODIUM CHLORIDE 0.9 % IV SOLN
Freq: Once | INTRAVENOUS | Status: AC
  Administered 2015-12-23: 15:00:00 via INTRAVENOUS
  Filled 2015-12-23: qty 1000

## 2015-12-23 MED ORDER — HEPARIN SOD (PORK) LOCK FLUSH 100 UNIT/ML IV SOLN
500.0000 [IU] | Freq: Once | INTRAVENOUS | Status: AC
  Administered 2015-12-23: 500 [IU] via INTRAVENOUS
  Filled 2015-12-23: qty 5

## 2015-12-23 NOTE — Progress Notes (Signed)
[facility] OFF[facility] PROGRESS NOTE  Patient Care Team: [facility]., MD as PCP - General (Family Medicine)   SUMMARY OF ONCOLOGIC HISTORY:  # 2012- METASTATIC ADENO CA of LEFT LUNG [acinar pattern] s/p MED LN Bx [NEG- EGFR/K-ras/ALK mutation];[name]; [name]; CT NOV 2016- NED   INTERVAL HISTORY: [name] with above history of metastatic adenocarcinoma the lung currently on maintenance Alimta since June 2012 is here for follow-up.  Patient had episode of upper respiratory tract infection that was treated with Z-Pak.   Currently he feels good and no cough no shortness of breath or chest pain. Patient denies any headaches or Vision changes. Appetite is good. Denies any weight loss.  REVIEW OF SYSTEMS:  A complete 10 point review of system is done which is negative except mentioned above/history of present illness.   PAST MEDICAL HISTORY :  Past Medical History  Diagnosis Date  . Allergy   . Mild valvular heart disease Nov. 2014    per 2 D Echocardiogram done for persistent leg edema, monitored by cardiology  . Lung cancer ([place])   . Peripheral edema     PAST SURGICAL HISTORY :   Past Surgical History  Procedure Laterality Date  . Elbow surgery    . Left anterior thoracotomy with biopsy  March 2012  . Portacath placement  March 2012    FAMILY HISTORY :   Family History  Problem Relation Age of Onset  . Leukemia Father     SOCIAL HISTORY:   Social History  Substance Use Topics  . Smoking status: Former Smoker    Types: Cigarettes  . Smokeless tobacco: Never Used  . Alcohol Use: 0.0 oz/week    0 Standard drinks or equivalent per week     Comment: "social drinker"     ALLERGIES:  is allergic to penicillins.  MEDICATIONS:  Current Outpatient Prescriptions  Medication Sig Dispense Refill  . folic acid  (FOLVITE) 1 MG tablet Take 1 tablet (1 mg total) by mouth daily. 30 tablet 6  . furosemide (LASIX) 20 MG tablet Take 1 tablet (20 mg total) by mouth daily. One tablet daily as needed for swelling 90 tablet 1  . glucosamine-chondroitin 500-400 MG tablet Take 1 tablet by mouth daily.    . meclizine (ANTIVERT) 25 MG tablet Take 1 tablet (25 mg total) by mouth 3 (three) times daily as needed for dizziness. 60 tablet 0  . meloxicam (MOBIC) 7.5 MG tablet Take 7.5 mg by mouth daily.    . Multiple Vitamin (MULTIVITAMIN) capsule Take 1 capsule by mouth daily.    . Omega-3 Fatty Acids (FISH OIL) 1000 MG CAPS Take 1 capsule by mouth daily.    . ondansetron (ZOFRAN-ODT) 4 MG disintegrating tablet Take 1 tablet (4 mg total) by mouth every 4 (four) hours as needed for nausea or vomiting. 20 tablet 2  . senna (SENOKOT) 8.6 MG tablet Take 1 tablet by mouth daily. Take as needed around time of chemotherapy    . spironolactone (ALDACTONE) 25 MG tablet Take 1 tablet (25 mg total) by mouth daily. 90 tablet 1  . vitamin B-12 (CYANOCOBALAMIN) 1000 MCG tablet Take 1,000 mcg by mouth daily.    Mitchell Kitchen CIALIS 20 MG tablet Take 0.5 tablets by mouth. Every 2 days as needed  12   No current facility-administered medications for this visit.   Facility-Administered Medications Ordered in Other  Visits  Medication Dose Route Frequency Provider Last Rate Last Dose  . heparin lock flush 100 unit/mL  500 Units Intravenous Once Mitchell Sickle, MD      . sodium chloride 0.9 % injection 10 mL  10 mL Intracatheter PRN Mitchell Alf, MD   10 mL at 05/27/15 1543  . sodium chloride 0.9 % injection 10 mL  10 mL Intracatheter PRN Mitchell Alf, MD   10 mL at 06/17/15 1510  . sodium chloride 0.9 % injection 10 mL  10 mL Intravenous PRN Mitchell Gleason, MD   10 mL at 07/08/15 1340  . sodium chloride 0.9 % injection 10 mL  10 mL Intravenous PRN Mitchell Sickle, MD        PHYSICAL EXAMINATION: ECOG PERFORMANCE STATUS: 0 -  Asymptomatic  BP 131/85 mmHg  Pulse 75  Temp(Src) 97.5 F (36.4 C) (Tympanic)  Resp 18  Ht '5\' 10"'  (1.778 m)  Wt 193 lb 2 oz (87.6 kg)  BMI 27.71 kg/m2  Filed Weights   12/23/15 1409  Weight: 193 lb 2 oz (87.6 kg)    GENERAL: Well-nourished well-developed; Alert, [name]; good dentition  NECK: supple, [name], [name]; No lower extremity edema ABDOMEN:abdomen soft, [name]  LABORATORY DATA:  I have reviewed the data as listed    Component Value Date/Time   NA 133* 12/23/2015 1345   NA 138 12/10/2014 1341   K 3.6 12/23/2015 1345   K 3.6 12/10/2014 1341   CL 99* 12/23/2015 1345   CL 102 12/10/2014 1341   CO2 29 12/23/2015 1345   CO2 30 12/10/2014 1341   GLUCOSE 106* 12/23/2015 1345   GLUCOSE 160* 12/10/2014 1341   BUN 18 12/23/2015 1345   BUN 21* 12/10/2014 1341   CREATININE 0.95 12/23/2015 1345   CREATININE 1.10 03/25/2015 1453   CALCIUM 9.2 12/23/2015 1345   CALCIUM 8.4* 12/10/2014 1341   PROT 6.9 12/23/2015 1345   PROT 6.6 12/10/2014 1341   ALBUMIN 3.8 12/23/2015 1345   ALBUMIN 3.4 12/10/2014 1341   AST 26 12/23/2015 1345   AST 17 12/10/2014 1341   ALT 19 12/23/2015 1345   ALT 23 12/10/2014 1341   ALKPHOS 43 12/23/2015 1345   ALKPHOS 50 12/10/2014 1341   BILITOT 0.8 12/23/2015 1345   BILITOT 0.5 12/10/2014 1341   GFRNONAA >60 12/23/2015 1345   GFRNONAA >60 03/25/2015 1453   GFRNONAA >60 12/31/2014 1346   GFRAA >60 12/23/2015 1345   GFRAA >60 03/25/2015 1453   GFRAA >60  12/31/2014 1346    No results found for: SPEP, UPEP  Lab Results  Component Value Date   WBC 5.2 12/23/2015   NEUTROABS 2.8 12/23/2015   HGB 13.7 12/23/2015   HCT 40.2 12/23/2015   MCV 93.0 12/23/2015   PLT 165 12/23/2015      Chemistry      Component Value Date/Time   NA 133* 12/23/2015 1345   NA 138 12/10/2014 1341   K 3.6 12/23/2015 1345   K 3.6 12/10/2014 1341   CL 99* 12/23/2015  1345   CL 102 12/10/2014 1341   CO2 29 12/23/2015 1345   CO2 30 12/10/2014 1341   BUN 18 12/23/2015 1345   BUN 21* 12/10/2014 1341   CREATININE 0.95 12/23/2015 1345   CREATININE 1.10 03/25/2015 1453      Component Value Date/Time   CALCIUM 9.2 12/23/2015 1345   CALCIUM 8.4* 12/10/2014 1341   ALKPHOS 43 12/23/2015 1345   ALKPHOS 50 12/10/2014 1341   AST 26 12/23/2015 1345   AST 17 12/10/2014 1341   ALT 19 12/23/2015 1345   ALT 23 12/10/2014 1341   BILITOT 0.8 12/23/2015 1345   BILITOT 0.5 12/10/2014 1341       RADIOGRAPHIC STUDIES: I have personally reviewed the radiological images as listed and agreed with the findings in the report. No results found.   ASSESSMENT & PLAN:   # Metastatic adenocarcinoma the lung- currently on maintenance Alimta/on protocol; June 2012. Clinically no concerns for obvious progression at this time. November 2016- CT scan-chest abdomen pelvis does not show any any evidence of recurrence/progression.   # patient tolerating chemotherapy very well. Continue Alimta every 3 weeks. Patient is on folic OZHY/Q65 injections. CBC within normal limits.  # Mild swelling in the legs-currently improved continues to be on Lasix/spirinolactone.   # Awaiting on the CMP; otherwise proceed with treatment today.   # Follow up in approximately 3 weeks/prior to next cycle of chemotherapy/ will plan ordering CT scans at next visit.  # 25 minutes face-to-face with the patient discussing the above plan of care; more than 50% of time spent on prognosis/ natural history;  counseling and coordination.     Mitchell Sickle, MD 12/23/2015 2:17 PM

## 2015-12-23 NOTE — Progress Notes (Signed)
12/23/2015 @  14:15pm  Mr. Goyer returns to clinic today for consideration of cycle #580 infusion of Alimta on the [employer] research study. Patient reports he is doing well. Patient reports the cyst like knot is better and has now decreased in size. The congestion/upper respiratory symptoms he had at last visit (12/02/2015) was resolved after taking a Z-pack as prescribed. Continues to have mild swelling in the legs which is a grade 1; currently on Lasix/Spirinolactone.   CBC and chemistries reviewed with Dr. Rogue Bussing and all within acceptable parameters. Weight remains stable at 87.6 kg. Vital signs stable. He will receive treatment as planned. Patient will return in 3 weeks on January 13, 2016 to see Dr. Rogue Bussing for the next cycle. His CT  will be scheduled at next visit.  Adverse events and grade attributions below:                                                         Edema in bilateral lower extremities - grade 1; likely related to Alimta                                      Hyponatremia - grade 1; likely related to Alimta                                     Cyst right hand 2nd knuckle- grade 1, unlikely related to Alimta

## 2016-01-13 ENCOUNTER — Inpatient Hospital Stay: Payer: BLUE CROSS/BLUE SHIELD | Attending: Internal Medicine

## 2016-01-13 ENCOUNTER — Inpatient Hospital Stay: Payer: BLUE CROSS/BLUE SHIELD

## 2016-01-13 ENCOUNTER — Inpatient Hospital Stay (HOSPITAL_BASED_OUTPATIENT_CLINIC_OR_DEPARTMENT_OTHER): Payer: BLUE CROSS/BLUE SHIELD | Admitting: Internal Medicine

## 2016-01-13 VITALS — BP 120/79 | HR 67 | Temp 96.0°F | Resp 18 | Ht 70.0 in | Wt 198.4 lb

## 2016-01-13 DIAGNOSIS — C3490 Malignant neoplasm of unspecified part of unspecified bronchus or lung: Secondary | ICD-10-CM

## 2016-01-13 DIAGNOSIS — C3492 Malignant neoplasm of unspecified part of left bronchus or lung: Secondary | ICD-10-CM | POA: Diagnosis not present

## 2016-01-13 DIAGNOSIS — Z006 Encounter for examination for normal comparison and control in clinical research program: Secondary | ICD-10-CM | POA: Diagnosis not present

## 2016-01-13 DIAGNOSIS — Z5111 Encounter for antineoplastic chemotherapy: Secondary | ICD-10-CM | POA: Insufficient documentation

## 2016-01-13 DIAGNOSIS — R600 Localized edema: Secondary | ICD-10-CM | POA: Insufficient documentation

## 2016-01-13 DIAGNOSIS — C3491 Malignant neoplasm of unspecified part of right bronchus or lung: Secondary | ICD-10-CM

## 2016-01-13 DIAGNOSIS — I38 Endocarditis, valve unspecified: Secondary | ICD-10-CM | POA: Diagnosis not present

## 2016-01-13 DIAGNOSIS — Z87891 Personal history of nicotine dependence: Secondary | ICD-10-CM | POA: Diagnosis not present

## 2016-01-13 DIAGNOSIS — Z79899 Other long term (current) drug therapy: Secondary | ICD-10-CM | POA: Diagnosis not present

## 2016-01-13 LAB — CBC WITH DIFFERENTIAL/PLATELET
BASOS ABS: 0 10*3/uL (ref 0–0.1)
Basophils Relative: 1 %
Eosinophils Absolute: 0.2 10*3/uL (ref 0–0.7)
Eosinophils Relative: 3 %
HEMATOCRIT: 38.9 % — AB (ref 40.0–52.0)
HEMOGLOBIN: 13.5 g/dL (ref 13.0–18.0)
LYMPHS ABS: 1.7 10*3/uL (ref 1.0–3.6)
LYMPHS PCT: 30 %
MCH: 32.1 pg (ref 26.0–34.0)
MCHC: 34.6 g/dL (ref 32.0–36.0)
MCV: 92.5 fL (ref 80.0–100.0)
Monocytes Absolute: 0.5 10*3/uL (ref 0.2–1.0)
Monocytes Relative: 8 %
NEUTROS ABS: 3.3 10*3/uL (ref 1.4–6.5)
Neutrophils Relative %: 58 %
PLATELETS: 149 10*3/uL — AB (ref 150–440)
RBC: 4.2 MIL/uL — AB (ref 4.40–5.90)
RDW: 13 % (ref 11.5–14.5)
WBC: 5.8 10*3/uL (ref 3.8–10.6)

## 2016-01-13 LAB — COMPREHENSIVE METABOLIC PANEL
ALK PHOS: 44 U/L (ref 38–126)
ALT: 21 U/L (ref 17–63)
AST: 28 U/L (ref 15–41)
Albumin: 4 g/dL (ref 3.5–5.0)
Anion gap: 6 (ref 5–15)
BILIRUBIN TOTAL: 0.5 mg/dL (ref 0.3–1.2)
BUN: 26 mg/dL — AB (ref 6–20)
CALCIUM: 8.5 mg/dL — AB (ref 8.9–10.3)
CHLORIDE: 98 mmol/L — AB (ref 101–111)
CO2: 27 mmol/L (ref 22–32)
CREATININE: 1.19 mg/dL (ref 0.61–1.24)
GFR calc Af Amer: >60 mL/min (ref >60)
Glucose, Bld: 125 mg/dL — ABNORMAL HIGH (ref 65–99)
Potassium: 3.6 mmol/L (ref 3.5–5.1)
Sodium: 131 mmol/L — ABNORMAL LOW (ref 135–145)
Total Protein: 6.8 g/dL (ref 6.5–8.1)

## 2016-01-13 MED ORDER — DEXAMETHASONE SODIUM PHOSPHATE 100 MG/10ML IJ SOLN
Freq: Once | INTRAMUSCULAR | Status: AC
  Administered 2016-01-13: 15:00:00 via INTRAVENOUS
  Filled 2016-01-13: qty 4

## 2016-01-13 MED ORDER — SODIUM CHLORIDE 0.9 % IV SOLN
500.0000 mg/m2 | Freq: Once | INTRAVENOUS | Status: AC
  Administered 2016-01-13: 1050 mg via INTRAVENOUS
  Filled 2016-01-13: qty 42

## 2016-01-13 MED ORDER — HEPARIN SOD (PORK) LOCK FLUSH 100 UNIT/ML IV SOLN
500.0000 [IU] | Freq: Once | INTRAVENOUS | Status: AC | PRN
  Administered 2016-01-13: 500 [IU]
  Filled 2016-01-13: qty 5

## 2016-01-13 MED ORDER — SODIUM CHLORIDE 0.9 % IV SOLN
Freq: Once | INTRAVENOUS | Status: AC
  Administered 2016-01-13: 15:00:00 via INTRAVENOUS
  Filled 2016-01-13: qty 1000

## 2016-01-13 MED ORDER — SODIUM CHLORIDE 0.9 % IJ SOLN
10.0000 mL | INTRAMUSCULAR | Status: DC | PRN
  Filled 2016-01-13: qty 10

## 2016-01-13 NOTE — Progress Notes (Signed)
[facility] OFFICE PROGRESS NOTE  Patient Care Team: Mitchell Mosley., MD as PCP - General (Family Medicine)   SUMMARY OF ONCOLOGIC HISTORY:  # 2012- METASTATIC ADENO CA of LEFT LUNG [acinar pattern] s/p MED LN Bx [NEG- EGFR/K-ras/ALK mutation];[name]; [name]; CT NOV 2016- NED   INTERVAL HISTORY: [name] with above history of metastatic adenocarcinoma the lung currently on maintenance Alimta since June 2012 is here for follow-up.   Patient has been very active. No new cough or shortness of breath or chest pain.  He has mild intermittent swelling in the legs. No skin rash. Patient does not take the medication/steroids. Patient denies any headaches or Vision changes. Appetite is good. Denies any weight loss.  REVIEW OF SYSTEMS:  A complete 10 point review of system is done which is negative except mentioned above/history of present illness.   PAST MEDICAL HISTORY :  Past Medical History  Diagnosis Date  . Allergy   . Mild valvular heart disease Nov. 2014    per 2 D Echocardiogram done for persistent leg edema, monitored by cardiology  . Lung cancer ([place])   . Peripheral edema     PAST SURGICAL HISTORY :   Past Surgical History  Procedure Laterality Date  . Elbow surgery    . Left anterior thoracotomy with biopsy  March 2012  . Portacath placement  March 2012    FAMILY HISTORY :   Family History  Problem Relation Age of Onset  . Leukemia Father     SOCIAL HISTORY:   Social History  Substance Use Topics  . Smoking status: Former Smoker    Types: Cigarettes  . Smokeless tobacco: Never Used  . Alcohol Use: 0.0 oz/week    0 Standard drinks or equivalent per week     Comment: "social drinker"     ALLERGIES:  is allergic to penicillins.  MEDICATIONS:  Current Outpatient Prescriptions  Medication Sig Dispense  Refill  . CIALIS 20 MG tablet Take 0.5 tablets by mouth. Every 2 days as needed  12  . folic acid (FOLVITE) 1 MG tablet Take 1 tablet (1 mg total) by mouth daily. 30 tablet 6  . furosemide (LASIX) 20 MG tablet Take 1 tablet (20 mg total) by mouth daily. One tablet daily as needed for swelling 90 tablet 1  . glucosamine-chondroitin 500-400 MG tablet Take 1 tablet by mouth daily.    . meclizine (ANTIVERT) 25 MG tablet Take 1 tablet (25 mg total) by mouth 3 (three) times daily as needed for dizziness. 60 tablet 0  . meloxicam (MOBIC) 7.5 MG tablet Take 7.5 mg by mouth daily.    . Multiple Vitamin (MULTIVITAMIN) capsule Take 1 capsule by mouth daily.    . Omega-3 Fatty Acids (FISH OIL) 1000 MG CAPS Take 1 capsule by mouth daily.    . ondansetron (ZOFRAN-ODT) 4 MG disintegrating tablet Take 1 tablet (4 mg total) by mouth every 4 (four) hours as needed for nausea or vomiting. 20 tablet 2  . senna (SENOKOT) 8.6 MG tablet Take 1 tablet by mouth daily. Take as needed around time of chemotherapy    . spironolactone (ALDACTONE) 25 MG tablet Take 1 tablet (25 mg total) by mouth daily. 90 tablet 1  . vitamin B-12 (CYANOCOBALAMIN) 1000 MCG tablet Take 1,000 mcg by mouth daily.     No current facility-administered medications for this visit.  Facility-Administered Medications Ordered in Other Visits  Medication Dose Route Frequency Provider Last Rate Last Dose  . sodium chloride 0.9 % injection 10 mL  10 mL Intracatheter PRN [name], MD   10 mL at 05/27/15 1543  . sodium chloride 0.9 % injection 10 mL  10 mL Intracatheter PRN [name], MD   10 mL at 06/17/15 1510  . sodium chloride 0.9 % injection 10 mL  10 mL Intravenous PRN [name], MD   10 mL at 07/08/15 1340    PHYSICAL EXAMINATION: ECOG PERFORMANCE STATUS: 0 - Asymptomatic  BP 120/79 mmHg  Pulse 67  Temp(Src) 96 F (35.6 C) (Tympanic)  Resp 18  Ht '5\' 10"'$  (1.778 m)  Wt 198 lb 6.6 oz (90 kg)  BMI 28.47 kg/m2  Filed Weights    01/13/16 1356  Weight: 198 lb 6.6 oz (90 kg)    GENERAL: Well-nourished well-developed; Alert, [name]; good dentition  NECK: supple, [name], [name]; 1+ bilateral lower extremity swelling. ABDOMEN:abdomen soft, [name]  LABORATORY DATA:  I have reviewed the data as listed    Component Value Date/Time   NA 133* 12/23/2015 1345   NA 138 12/10/2014 1341   K 3.6 12/23/2015 1345   K 3.6 12/10/2014 1341   CL 99* 12/23/2015 1345   CL 102 12/10/2014 1341   CO2 29 12/23/2015 1345   CO2 30 12/10/2014 1341   GLUCOSE 106* 12/23/2015 1345   GLUCOSE 160* 12/10/2014 1341   BUN 18 12/23/2015 1345   BUN 21* 12/10/2014 1341   CREATININE 0.95 12/23/2015 1345   CREATININE 1.10 03/25/2015 1453   CALCIUM 9.2 12/23/2015 1345   CALCIUM 8.4* 12/10/2014 1341   PROT 6.9 12/23/2015 1345   PROT 6.6 12/10/2014 1341   ALBUMIN 3.8 12/23/2015 1345   ALBUMIN 3.4 12/10/2014 1341   AST 26 12/23/2015 1345   AST 17 12/10/2014 1341   ALT 19 12/23/2015 1345   ALT 23 12/10/2014 1341   ALKPHOS 43 12/23/2015 1345   ALKPHOS 50 12/10/2014 1341   BILITOT 0.8 12/23/2015 1345   BILITOT 0.5 12/10/2014 1341   GFRNONAA >60 12/23/2015 1345   GFRNONAA >60 03/25/2015 1453   GFRNONAA >60 12/31/2014 1346   GFRAA >60 12/23/2015 1345   GFRAA >60 03/25/2015 1453   GFRAA >60 12/31/2014 1346    No results found for: SPEP, UPEP  Lab Results  Component Value Date   WBC 5.8 01/13/2016   NEUTROABS 3.3 01/13/2016   HGB 13.5 01/13/2016    HCT 38.9* 01/13/2016   MCV 92.5 01/13/2016   PLT 149* 01/13/2016      Chemistry      Component Value Date/Time   NA 133* 12/23/2015 1345   NA 138 12/10/2014 1341   K 3.6 12/23/2015 1345   K 3.6 12/10/2014 1341   CL 99* 12/23/2015 1345   CL 102 12/10/2014 1341   CO2 29 12/23/2015 1345   CO2 30 12/10/2014 1341   BUN 18 12/23/2015 1345   BUN 21* 12/10/2014 1341   CREATININE 0.95 12/23/2015  1345   CREATININE 1.10 03/25/2015 1453      Component Value Date/Time   CALCIUM 9.2 12/23/2015 1345   CALCIUM 8.4* 12/10/2014 1341   ALKPHOS 43 12/23/2015 1345   ALKPHOS 50 12/10/2014 1341   AST 26 12/23/2015 1345   AST 17 12/10/2014 1341   ALT 19 12/23/2015 1345   ALT 23 12/10/2014 1341   BILITOT 0.8 12/23/2015 1345   BILITOT 0.5 12/10/2014 1341       RADIOGRAPHIC STUDIES: I have personally reviewed the radiological images as listed and agreed with the findings in the report. No results found.   ASSESSMENT & PLAN:   # Metastatic adenocarcinoma the lung- currently on maintenance Alimta/on protocol; June 2012. Last imaging, November 2016- CT scan-chest abdomen pelvis does not show any any evidence of recurrence/progression.   # Clinically no concerns for obvious progression at this time. Patient tolerating chemotherapy well. Continue Alimta every 3 weeks. Patient is on folic TMMI/T94 injections. CBC-limits; platelets 149. CMP pending; otherwise proceed with treatment today.   # Mild swelling in the legs-currently improved continues to be on Lasix/spirinolactone.   # Follow up in approximately 3 weeks/ CT scans prior to next visit.     [name], MD 01/13/2016 2:05 PM

## 2016-01-13 NOTE — Progress Notes (Signed)
  Mitchell Mosley returns to clinic today for consideration of cycle #581 infusion of Alimta on the [employer] research study. Patient reports no complaints today. His only concern is recent weight gain, he states he's been working out more and that it may come from muscle gain. His weight has increased from last visit on 12/23/2015 which was 87.6kg (193.2 lbs) to 90 kg (198.6 lbs) today. This was discussed with Dr. Rogue Bussing and patient was encouraged to continue with current exercise regimen. Patient also reports the cyst on right hand 2nd knuckle continues to decrease in size and does not limit his activity. Patient continues with mild swelling in the legs and is currently improved. He continues to be on Lasix/Spirinolactone.  CBC and chemistries were reviewed with Dr. Rogue Bussing and all were within acceptable parameters to proceed with treatment as planned for today. Vital signs are stable. Patient is scheduled for CT of chest and abdomen on January 30, 2016 at 8:15am. He will return to clinic on February 03, 2016 to see MD, labs, infusion and B-12 injection. Adverse events and grade attributions below:  Mirian Mo, RN, BSN 01/13/2016 4:51 PM        Edema in bilateral lower extremities- grade 1; likely related to Alimta     Hyponatremia- grade 1; likely related to Alimta     Cyst right hand 2nd knuckle- grade 1, unlikely related to Alimta

## 2016-01-30 ENCOUNTER — Ambulatory Visit
Admission: RE | Admit: 2016-01-30 | Discharge: 2016-01-30 | Disposition: A | Discharge status: Home / Self Care | Payer: BLUE CROSS/BLUE SHIELD | Source: Ambulatory Visit | Attending: Internal Medicine | Admitting: Internal Medicine

## 2016-01-30 DIAGNOSIS — C3491 Malignant neoplasm of unspecified part of right bronchus or lung: Secondary | ICD-10-CM | POA: Diagnosis not present

## 2016-01-30 DIAGNOSIS — I712 Thoracic aortic aneurysm, without rupture: Secondary | ICD-10-CM | POA: Diagnosis not present

## 2016-01-30 MED ORDER — IOHEXOL 300 MG/ML  SOLN
100.0000 mL | Freq: Once | INTRAMUSCULAR | Status: AC | PRN
  Administered 2016-01-30: 100 mL via INTRAVENOUS

## 2016-02-01 ENCOUNTER — Other Ambulatory Visit: Payer: Self-pay | Admitting: *Deleted

## 2016-02-02 DIAGNOSIS — I38 Endocarditis, valve unspecified: Secondary | ICD-10-CM | POA: Insufficient documentation

## 2016-02-02 DIAGNOSIS — R600 Localized edema: Secondary | ICD-10-CM | POA: Insufficient documentation

## 2016-02-02 DIAGNOSIS — C3492 Malignant neoplasm of unspecified part of left bronchus or lung: Secondary | ICD-10-CM | POA: Insufficient documentation

## 2016-02-02 DIAGNOSIS — Z880 Allergy status to penicillin: Secondary | ICD-10-CM | POA: Insufficient documentation

## 2016-02-02 DIAGNOSIS — I712 Thoracic aortic aneurysm, without rupture: Secondary | ICD-10-CM | POA: Insufficient documentation

## 2016-02-02 DIAGNOSIS — Z5111 Encounter for antineoplastic chemotherapy: Secondary | ICD-10-CM | POA: Insufficient documentation

## 2016-02-02 DIAGNOSIS — Z87891 Personal history of nicotine dependence: Secondary | ICD-10-CM | POA: Insufficient documentation

## 2016-02-02 DIAGNOSIS — Z791 Long term (current) use of non-steroidal anti-inflammatories (NSAID): Secondary | ICD-10-CM | POA: Insufficient documentation

## 2016-02-02 DIAGNOSIS — Z006 Encounter for examination for normal comparison and control in clinical research program: Secondary | ICD-10-CM | POA: Insufficient documentation

## 2016-02-02 DIAGNOSIS — Z79899 Other long term (current) drug therapy: Secondary | ICD-10-CM | POA: Insufficient documentation

## 2016-02-03 ENCOUNTER — Inpatient Hospital Stay: Payer: BLUE CROSS/BLUE SHIELD | Attending: Internal Medicine

## 2016-02-03 ENCOUNTER — Inpatient Hospital Stay (HOSPITAL_BASED_OUTPATIENT_CLINIC_OR_DEPARTMENT_OTHER): Payer: BLUE CROSS/BLUE SHIELD | Admitting: Internal Medicine

## 2016-02-03 ENCOUNTER — Inpatient Hospital Stay: Payer: BLUE CROSS/BLUE SHIELD

## 2016-02-03 ENCOUNTER — Encounter: Payer: Self-pay | Admitting: *Deleted

## 2016-02-03 ENCOUNTER — Encounter: Payer: Self-pay | Admitting: Internal Medicine

## 2016-02-03 VITALS — BP 122/77 | HR 68 | Temp 97.3°F | Resp 18 | Ht 70.0 in | Wt 198.2 lb

## 2016-02-03 DIAGNOSIS — C3491 Malignant neoplasm of unspecified part of right bronchus or lung: Secondary | ICD-10-CM

## 2016-02-03 DIAGNOSIS — Z791 Long term (current) use of non-steroidal anti-inflammatories (NSAID): Secondary | ICD-10-CM | POA: Diagnosis not present

## 2016-02-03 DIAGNOSIS — R600 Localized edema: Secondary | ICD-10-CM | POA: Diagnosis not present

## 2016-02-03 DIAGNOSIS — I712 Thoracic aortic aneurysm, without rupture: Secondary | ICD-10-CM | POA: Diagnosis not present

## 2016-02-03 DIAGNOSIS — Z79899 Other long term (current) drug therapy: Secondary | ICD-10-CM

## 2016-02-03 DIAGNOSIS — Z880 Allergy status to penicillin: Secondary | ICD-10-CM | POA: Diagnosis not present

## 2016-02-03 DIAGNOSIS — Z87891 Personal history of nicotine dependence: Secondary | ICD-10-CM | POA: Diagnosis not present

## 2016-02-03 DIAGNOSIS — I38 Endocarditis, valve unspecified: Secondary | ICD-10-CM

## 2016-02-03 DIAGNOSIS — C3492 Malignant neoplasm of unspecified part of left bronchus or lung: Secondary | ICD-10-CM

## 2016-02-03 DIAGNOSIS — Z5111 Encounter for antineoplastic chemotherapy: Secondary | ICD-10-CM | POA: Diagnosis present

## 2016-02-03 DIAGNOSIS — Z006 Encounter for examination for normal comparison and control in clinical research program: Secondary | ICD-10-CM

## 2016-02-03 DIAGNOSIS — C3490 Malignant neoplasm of unspecified part of unspecified bronchus or lung: Secondary | ICD-10-CM

## 2016-02-03 LAB — COMPREHENSIVE METABOLIC PANEL
ALBUMIN: 4 g/dL (ref 3.5–5.0)
ALT: 19 U/L (ref 17–63)
ANION GAP: 6 (ref 5–15)
AST: 22 U/L (ref 15–41)
Alkaline Phosphatase: 49 U/L (ref 38–126)
BUN: 22 mg/dL — ABNORMAL HIGH (ref 6–20)
CHLORIDE: 99 mmol/L — AB (ref 101–111)
CO2: 28 mmol/L (ref 22–32)
Calcium: 8.6 mg/dL — ABNORMAL LOW (ref 8.9–10.3)
Creatinine, Ser: 1.18 mg/dL (ref 0.61–1.24)
GFR calc non Af Amer: >60 mL/min (ref >60)
GLUCOSE: 136 mg/dL — AB (ref 65–99)
POTASSIUM: 3.6 mmol/L (ref 3.5–5.1)
SODIUM: 133 mmol/L — AB (ref 135–145)
Total Bilirubin: 0.7 mg/dL (ref 0.3–1.2)
Total Protein: 7.1 g/dL (ref 6.5–8.1)

## 2016-02-03 LAB — CBC WITH DIFFERENTIAL/PLATELET
BASOS PCT: 1 %
Basophils Absolute: 0 10*3/uL (ref 0–0.1)
EOS ABS: 0.2 10*3/uL (ref 0–0.7)
EOS PCT: 4 %
HCT: 41.1 % (ref 40.0–52.0)
Hemoglobin: 14 g/dL (ref 13.0–18.0)
LYMPHS ABS: 1.7 10*3/uL (ref 1.0–3.6)
Lymphocytes Relative: 31 %
MCH: 31.9 pg (ref 26.0–34.0)
MCHC: 34.1 g/dL (ref 32.0–36.0)
MCV: 93.5 fL (ref 80.0–100.0)
MONOS PCT: 7 %
Monocytes Absolute: 0.4 10*3/uL (ref 0.2–1.0)
Neutro Abs: 3.1 10*3/uL (ref 1.4–6.5)
Neutrophils Relative %: 57 %
PLATELETS: 151 10*3/uL (ref 150–440)
RBC: 4.39 MIL/uL — AB (ref 4.40–5.90)
RDW: 13.2 % (ref 11.5–14.5)
WBC: 5.4 10*3/uL (ref 3.8–10.6)

## 2016-02-03 MED ORDER — SODIUM CHLORIDE 0.9 % IJ SOLN
10.0000 mL | INTRAMUSCULAR | Status: DC | PRN
  Filled 2016-02-03: qty 10

## 2016-02-03 MED ORDER — SODIUM CHLORIDE 0.9% FLUSH
10.0000 mL | Freq: Once | INTRAVENOUS | Status: AC
  Administered 2016-02-03: 10 mL via INTRAVENOUS
  Filled 2016-02-03: qty 10

## 2016-02-03 MED ORDER — CYANOCOBALAMIN 1000 MCG/ML IJ SOLN
1000.0000 ug | Freq: Once | INTRAMUSCULAR | Status: AC
  Administered 2016-02-03: 1000 ug via INTRAMUSCULAR
  Filled 2016-02-03: qty 1

## 2016-02-03 MED ORDER — HEPARIN SOD (PORK) LOCK FLUSH 100 UNIT/ML IV SOLN: 500.0000 [IU] | Freq: Once | INTRAVENOUS | Status: DC | PRN

## 2016-02-03 MED ORDER — SODIUM CHLORIDE 0.9 % IV SOLN
Freq: Once | INTRAVENOUS | Status: AC
  Administered 2016-02-03: 14:00:00 via INTRAVENOUS
  Filled 2016-02-03: qty 1000

## 2016-02-03 MED ORDER — SODIUM CHLORIDE 0.9 % IV SOLN
Freq: Once | INTRAVENOUS | Status: AC
  Administered 2016-02-03: 15:00:00 via INTRAVENOUS
  Filled 2016-02-03: qty 4

## 2016-02-03 MED ORDER — SODIUM CHLORIDE 0.9 % IV SOLN
500.0000 mg/m2 | Freq: Once | INTRAVENOUS | Status: AC
  Administered 2016-02-03: 1050 mg via INTRAVENOUS
  Filled 2016-02-03: qty 42

## 2016-02-03 MED ORDER — HEPARIN SOD (PORK) LOCK FLUSH 100 UNIT/ML IV SOLN
500.0000 [IU] | Freq: Once | INTRAVENOUS | Status: AC
  Administered 2016-02-03: 500 [IU] via INTRAVENOUS
  Filled 2016-02-03: qty 5

## 2016-02-03 NOTE — Progress Notes (Signed)
[facility] OFFICE PROGRESS NOTE  Patient Care Team: Mitchell Mosley., MD as PCP - General (Family Medicine)   SUMMARY OF ONCOLOGIC HISTORY:  # 2012- METASTATIC ADENO CA of LEFT LUNG [acinar pattern] s/p MED LN Bx [NEG- EGFR/K-ras/ALK mutation];[name]; [name]; CT FEB  2016- NED  # Thoracic Aneurysm [4cm- stable]   INTERVAL HISTORY: [name] with above history of metastatic adenocarcinoma the lung currently on maintenance Alimta since June 2012 is here for follow-up/ review the results of his labs/scans.  Patient has been very active. No new cough or shortness of breath or chest pain. No skin rash. Patient does not take the medication/steroids. Patient denies any headaches or Vision changes. Appetite is good. Denies any weight loss.  REVIEW OF SYSTEMS:  A complete 10 point review of system is done which is negative except mentioned above/history of present illness.   PAST MEDICAL HISTORY :  Past Medical History  Diagnosis Date  . Allergy   . Mild valvular heart disease Nov. 2014    per 2 D Echocardiogram done for persistent leg edema, monitored by cardiology  . Lung cancer ([place])   . Peripheral edema     PAST SURGICAL HISTORY :   Past Surgical History  Procedure Laterality Date  . Elbow surgery    . Left anterior thoracotomy with biopsy  March 2012  . Portacath placement  March 2012    FAMILY HISTORY :   Family History  Problem Relation Age of Onset  . Leukemia Father     SOCIAL HISTORY:   Social History  Substance Use Topics  . Smoking status: Former Smoker    Types: Cigarettes  . Smokeless tobacco: Never Used  . Alcohol Use: 0.0 oz/week    0 Standard drinks or equivalent per week     Comment: "social drinker"     ALLERGIES:  is allergic to penicillins.  MEDICATIONS:  Current Outpatient Prescriptions   Medication Sig Dispense Refill  . CIALIS 20 MG tablet Take 0.5 tablets by mouth. Every 2 days as needed  12  . folic acid (FOLVITE) 1 MG tablet Take 1 tablet (1 mg total) by mouth daily. 30 tablet 6  . furosemide (LASIX) 20 MG tablet Take 1 tablet (20 mg total) by mouth daily. One tablet daily as needed for swelling 90 tablet 1  . glucosamine-chondroitin 500-400 MG tablet Take 1 tablet by mouth daily.    . meclizine (ANTIVERT) 25 MG tablet Take 1 tablet (25 mg total) by mouth 3 (three) times daily as needed for dizziness. 60 tablet 0  . meloxicam (MOBIC) 7.5 MG tablet Take 7.5 mg by mouth daily.    . Multiple Vitamin (MULTIVITAMIN) capsule Take 1 capsule by mouth daily.    . Omega-3 Fatty Acids (FISH OIL) 1000 MG CAPS Take 1 capsule by mouth daily.    . ondansetron (ZOFRAN-ODT) 4 MG disintegrating tablet Take 1 tablet (4 mg total) by mouth every 4 (four) hours as needed for nausea or vomiting. 20 tablet 2  . senna (SENOKOT) 8.6 MG tablet Take 1 tablet by mouth daily. Take as needed around time of chemotherapy    . spironolactone (ALDACTONE) 25 MG tablet Take 1 tablet (25 mg total) by mouth daily. 90 tablet 1  . vitamin B-12 (CYANOCOBALAMIN) 1000 MCG tablet Take 1,000 mcg by mouth daily.     No current facility-administered medications for this  visit.   Facility-Administered Medications Ordered in Other Visits  Medication Dose Route Frequency Provider Last Rate Last Dose  . sodium chloride 0.9 % injection 10 mL  10 mL Intracatheter PRN Mitchell Alf, MD   10 mL at 05/27/15 1543  . sodium chloride 0.9 % injection 10 mL  10 mL Intracatheter PRN Mitchell Alf, MD   10 mL at 06/17/15 1510  . sodium chloride 0.9 % injection 10 mL  10 mL Intravenous PRN Mitchell Gleason, MD   10 mL at 07/08/15 1340    PHYSICAL EXAMINATION: ECOG PERFORMANCE STATUS: 0 - Asymptomatic  There were no vitals taken for this visit.  There were no vitals filed for this visit.  GENERAL: Well-nourished well-developed;  Alert, [name]; good dentition  NECK: supple, [name], [name]; 1+ bilateral lower extremity swelling. ABDOMEN:abdomen soft, [name]  LABORATORY DATA:  I have reviewed the data as listed    Component Value Date/Time   NA 131* 01/13/2016 1342   NA 138 12/10/2014 1341   K 3.6 01/13/2016 1342   K 3.6 12/10/2014 1341   CL 98* 01/13/2016 1342   CL 102 12/10/2014 1341   CO2 27 01/13/2016 1342   CO2 30 12/10/2014 1341   GLUCOSE 125* 01/13/2016 1342   GLUCOSE 160* 12/10/2014 1341   BUN 26* 01/13/2016 1342   BUN 21* 12/10/2014 1341   CREATININE 1.19 01/13/2016 1342   CREATININE 1.10 03/25/2015 1453   CALCIUM 8.5* 01/13/2016 1342   CALCIUM 8.4* 12/10/2014 1341   PROT 6.8 01/13/2016 1342   PROT 6.6 12/10/2014 1341   ALBUMIN 4.0 01/13/2016 1342   ALBUMIN 3.4 12/10/2014 1341   AST 28 01/13/2016 1342   AST 17 12/10/2014 1341   ALT 21 01/13/2016 1342   ALT 23 12/10/2014 1341   ALKPHOS 44 01/13/2016 1342   ALKPHOS 50 12/10/2014 1341   BILITOT 0.5 01/13/2016 1342   BILITOT 0.5 12/10/2014 1341   GFRNONAA >60 01/13/2016 1342   GFRNONAA >60 03/25/2015 1453   GFRNONAA >60 12/31/2014 1346   GFRAA >60 01/13/2016 1342   GFRAA >60 03/25/2015 1453   GFRAA >60 12/31/2014 1346    No results found for: SPEP, UPEP  Lab Results  Component Value Date   WBC 5.8 01/13/2016   NEUTROABS 3.3 01/13/2016   HGB 13.5 01/13/2016   HCT 38.9* 01/13/2016   MCV 92.5 01/13/2016   PLT 149* 01/13/2016      Chemistry       Component Value Date/Time   NA 131* 01/13/2016 1342   NA 138 12/10/2014 1341   K 3.6 01/13/2016 1342   K 3.6 12/10/2014 1341   CL 98* 01/13/2016 1342   CL 102 12/10/2014 1341   CO2 27 01/13/2016 1342   CO2 30 12/10/2014 1341   BUN 26* 01/13/2016 1342   BUN 21* 12/10/2014 1341   CREATININE 1.19 01/13/2016 1342   CREATININE 1.10 03/25/2015 1453      Component Value Date/Time   CALCIUM 8.5* 01/13/2016 1342   CALCIUM 8.4* 12/10/2014 1341   ALKPHOS 44  01/13/2016 1342   ALKPHOS 50 12/10/2014 1341   AST 28 01/13/2016 1342   AST 17 12/10/2014 1341   ALT 21 01/13/2016 1342   ALT 23 12/10/2014 1341   BILITOT 0.5 01/13/2016 1342   BILITOT 0.5 12/10/2014 1341         ASSESSMENT & PLAN:   # Metastatic adenocarcinoma the lung- currently on maintenance Alimta/on protocol; June 2012. FEB 2017- CT scan-chest abdomen pelvis does not show any any evidence of recurrence/progression [Reviewed the images myself; 3 mm right lower lobe nodule-likely benign./Stable since 2012.]  # Clinically no concerns for obvious progression at this time. Patient tolerating chemotherapy well. Continue Alimta every 3 weeks. Patient is on folic GXIV/H29 injections. CBC within normal limits; creatinine 1.18/stable.   # Mild swelling in the legs-currently improved; none at this time;  continues to be on Lasix/spirinolactone.   # Thoracic aneurysm 4 cm in size; stable. We will continue to monitor for now.  # Follow up in approximately 3 weeks/ labs M.D. Visit.   # 25 minutes face-to-face with the patient discussing the above plan of care; more than 50% of time spent on prognosis/ natural history; counseling and coordination.      [name], MD 02/03/2016 1:18 PM

## 2016-02-03 NOTE — Progress Notes (Signed)
[name] returns to clinic this afternoon for consideration of cycle #582 Alimta infusion. Patient states he is doing well overall. He has noticed that he felt more fatigued this last cycle than he usually does. States this is similar to the time when he first began receiving chemotherapy and was fatigued for several days afterward. States hit norm has been to feel a little fatigue for 1-2 days following his Alimta infusion, but this past treatment, the fatigue lasted 4-5 days after the infusion. States he did not notice it during the day while at work, but in the evenings he felt like he had no energy and was not motivated to even get up. Continues to report edema in his feet and ankles and occasionally his hands bilaterally. Dr. Rogue Bussing in to examine patient and reviewed labs with him, noting that his sodium level remains a little low, but no intervention was prescribed. Patient requested a copy of his lab work from today and will provide that to him. Will proceed with Alimta infusion as scheduled and [name] will also receive a B12 injection this cycle. Current AE's with grade and attribution as follows:       Edema in hands and BLE - grade 1; likely related to Alimta    Hyponatremia- grade 1; likely related to Alimta    Fatigue - grade 1, likely related to [place], BSN, MHA, OCN 02/03/2016 2:38 PM

## 2016-02-24 ENCOUNTER — Inpatient Hospital Stay: Payer: BLUE CROSS/BLUE SHIELD

## 2016-02-24 ENCOUNTER — Inpatient Hospital Stay (HOSPITAL_BASED_OUTPATIENT_CLINIC_OR_DEPARTMENT_OTHER): Payer: BLUE CROSS/BLUE SHIELD | Admitting: Internal Medicine

## 2016-02-24 ENCOUNTER — Encounter: Payer: Self-pay | Admitting: Internal Medicine

## 2016-02-24 VITALS — BP 115/79 | HR 66 | Temp 97.4°F | Resp 18 | Ht 70.0 in | Wt 197.5 lb

## 2016-02-24 DIAGNOSIS — C3491 Malignant neoplasm of unspecified part of right bronchus or lung: Secondary | ICD-10-CM

## 2016-02-24 DIAGNOSIS — C3490 Malignant neoplasm of unspecified part of unspecified bronchus or lung: Secondary | ICD-10-CM

## 2016-02-24 DIAGNOSIS — Z006 Encounter for examination for normal comparison and control in clinical research program: Secondary | ICD-10-CM | POA: Diagnosis not present

## 2016-02-24 DIAGNOSIS — Z5111 Encounter for antineoplastic chemotherapy: Secondary | ICD-10-CM | POA: Diagnosis not present

## 2016-02-24 DIAGNOSIS — C3492 Malignant neoplasm of unspecified part of left bronchus or lung: Secondary | ICD-10-CM

## 2016-02-24 LAB — COMPREHENSIVE METABOLIC PANEL
ALT: 22 U/L (ref 17–63)
ANION GAP: 6 (ref 5–15)
AST: 32 U/L (ref 15–41)
Albumin: 4.1 g/dL (ref 3.5–5.0)
Alkaline Phosphatase: 44 U/L (ref 38–126)
BUN: 27 mg/dL — AB (ref 6–20)
CHLORIDE: 97 mmol/L — AB (ref 101–111)
CO2: 28 mmol/L (ref 22–32)
Calcium: 8.8 mg/dL — ABNORMAL LOW (ref 8.9–10.3)
Creatinine, Ser: 0.98 mg/dL (ref 0.61–1.24)
GFR calc Af Amer: >60 mL/min (ref >60)
Glucose, Bld: 135 mg/dL — ABNORMAL HIGH (ref 65–99)
POTASSIUM: 3.8 mmol/L (ref 3.5–5.1)
Sodium: 131 mmol/L — ABNORMAL LOW (ref 135–145)
Total Bilirubin: 0.8 mg/dL (ref 0.3–1.2)
Total Protein: 7.1 g/dL (ref 6.5–8.1)

## 2016-02-24 LAB — CBC WITH DIFFERENTIAL/PLATELET
BASOS ABS: 0 10*3/uL (ref 0–0.1)
BASOS PCT: 1 %
EOS PCT: 1 %
Eosinophils Absolute: 0.1 10*3/uL (ref 0–0.7)
HCT: 38.1 % — ABNORMAL LOW (ref 40.0–52.0)
Hemoglobin: 13.3 g/dL (ref 13.0–18.0)
LYMPHS PCT: 25 %
Lymphs Abs: 1.6 10*3/uL (ref 1.0–3.6)
MCH: 32.2 pg (ref 26.0–34.0)
MCHC: 34.9 g/dL (ref 32.0–36.0)
MCV: 92.2 fL (ref 80.0–100.0)
MONO ABS: 0.5 10*3/uL (ref 0.2–1.0)
Monocytes Relative: 9 %
Neutro Abs: 4.1 10*3/uL (ref 1.4–6.5)
Neutrophils Relative %: 64 %
PLATELETS: 152 10*3/uL (ref 150–440)
RBC: 4.13 MIL/uL — ABNORMAL LOW (ref 4.40–5.90)
RDW: 13.2 % (ref 11.5–14.5)
WBC: 6.3 10*3/uL (ref 3.8–10.6)

## 2016-02-24 LAB — CREATININE, URINE, RANDOM: CREATININE, URINE: 39 mg/dL

## 2016-02-24 LAB — MAGNESIUM: MAGNESIUM: 1.8 mg/dL (ref 1.7–2.4)

## 2016-02-24 LAB — PROTEIN, URINE, RANDOM

## 2016-02-24 MED ORDER — CYANOCOBALAMIN 1000 MCG/ML IJ SOLN
1000.0000 ug | Freq: Once | INTRAMUSCULAR | Status: DC
  Filled 2016-02-24: qty 1

## 2016-02-24 MED ORDER — SODIUM CHLORIDE 0.9 % IV SOLN
Freq: Once | INTRAVENOUS | Status: AC
  Administered 2016-02-24: 15:00:00 via INTRAVENOUS
  Filled 2016-02-24: qty 1000

## 2016-02-24 MED ORDER — HEPARIN SOD (PORK) LOCK FLUSH 100 UNIT/ML IV SOLN
500.0000 [IU] | Freq: Once | INTRAVENOUS | Status: AC | PRN
  Administered 2016-02-24: 500 [IU]
  Filled 2016-02-24 (×2): qty 5

## 2016-02-24 MED ORDER — SODIUM CHLORIDE 0.9 % IV SOLN
500.0000 mg/m2 | Freq: Once | INTRAVENOUS | Status: AC
  Administered 2016-02-24: 1050 mg via INTRAVENOUS
  Filled 2016-02-24: qty 42

## 2016-02-24 MED ORDER — SODIUM CHLORIDE 0.9 % IJ SOLN
10.0000 mL | INTRAMUSCULAR | Status: DC | PRN
  Administered 2016-02-24: 10 mL
  Filled 2016-02-24: qty 10

## 2016-02-24 MED ORDER — SODIUM CHLORIDE 0.9 % IV SOLN
Freq: Once | INTRAVENOUS | Status: AC
  Administered 2016-02-24: 15:00:00 via INTRAVENOUS
  Filled 2016-02-24: qty 4

## 2016-02-24 NOTE — Progress Notes (Signed)
  Mr. Raghav Verrilli returns to clinic this afternoon for consideration of cycle #583 Alimta infusion. Patient states he is doing well and continues to exercise and play tennis. Dr. Rogue Bussing in to examine patient and reviewed all labs with patient. He continues to report edema in his feet and ankles. It was noted that his sodium level remains a little low, but no intervention was prescribed at this time.  Patient also states he did not see an increase in fatigue with last treatment on 02/03/16.  Vital signs and weight are stable. CBC and chemistries all within acceptable parameters to proceed with Alimta infusion as scheduled. The patient will return to clinic March 16, 2016 to see MD, labs, and receive Alimta infusion. Current AE's with grade and attribution as follows:    GUSTAVE LINDEMAN [id number]  02/24/2016  Adverse Event Log  Study/Protocol: [name] S130 Cycle: #583  Event Grade Onset Date Resolved Date Drug Name Attribution Treatment Comments  Edema (mild) Grade 1 05/30/2012 ongoing Alimta Possible None   Hyponatremia Grade 1 09/28/2015 ongoing Alimta Possible None   Fatigue Grade 1 02/03/2016 ongoing Alimta Possible None   Mirian Mo, RN, BSN 02/24/2016 2:41 PM

## 2016-02-24 NOTE — Progress Notes (Signed)
[facility] OFFICE PROGRESS NOTE  Patient Care Team: [name], MD as PCP - General (Family Medicine)   SUMMARY OF ONCOLOGIC HISTORY:  # 2012- METASTATIC ADENO CA of LEFT LUNG [acinar pattern] s/p MED LN Bx [NEG- EGFR/K-ras/ALK mutation];[name]; [name]; CT FEB  2016- NED  # Thoracic Aneurysm [4cm- stable]   INTERVAL HISTORY: [name] with above history of metastatic adenocarcinoma the lung currently on maintenance Alimta since June 2012 is here for follow-up prior to his chemotherapy.   Patient denies any cough or shortness of breath or chest pain. Denies any headaches. Denies any skin rash. He continues to be very active playing tennis.  Patient denies any headaches or Vision changes. Appetite is good. Denies any weight loss.  REVIEW OF SYSTEMS:  A complete 10 point review of system is done which is negative except mentioned above/history of present illness.   PAST MEDICAL HISTORY :  Past Medical History  Diagnosis Date  . Allergy   . Mild valvular heart disease Nov. 2014    per 2 D Echocardiogram done for persistent leg edema, monitored by cardiology  . Lung cancer ([place])   . Peripheral edema     PAST SURGICAL HISTORY :   Past Surgical History  Procedure Laterality Date  . Elbow surgery    . Left anterior thoracotomy with biopsy  March 2012  . Portacath placement  March 2012    FAMILY HISTORY :   Family History  Problem Relation Age of Onset  . Leukemia Father     SOCIAL HISTORY:   Social History  Substance Use Topics  . Smoking status: Former Smoker    Types: Cigarettes  . Smokeless tobacco: Never Used  . Alcohol Use: 0.0 oz/week    0 Standard drinks or equivalent per week     Comment: "social drinker"     ALLERGIES:  is allergic to penicillins.  MEDICATIONS:  Current Outpatient Prescriptions   Medication Sig Dispense Refill  . folic acid (FOLVITE) 1 MG tablet Take 1 tablet (1 mg total) by mouth daily. 30 tablet 6  . furosemide (LASIX) 20 MG tablet Take 1 tablet (20 mg total) by mouth daily. One tablet daily as needed for swelling 90 tablet 1  . meclizine (ANTIVERT) 25 MG tablet Take 1 tablet (25 mg total) by mouth 3 (three) times daily as needed for dizziness. 60 tablet 0  . meloxicam (MOBIC) 7.5 MG tablet Take 7.5 mg by mouth daily.    . ondansetron (ZOFRAN-ODT) 4 MG disintegrating tablet Take 1 tablet (4 mg total) by mouth every 4 (four) hours as needed for nausea or vomiting. 20 tablet 2  . senna (SENOKOT) 8.6 MG tablet Take 1 tablet by mouth daily. Take as needed around time of chemotherapy    . spironolactone (ALDACTONE) 25 MG tablet Take 1 tablet (25 mg total) by mouth daily. 90 tablet 1  . vitamin B-12 (CYANOCOBALAMIN) 1000 MCG tablet Take 1,000 mcg by mouth daily.    Mitchell Kitchen glucosamine-chondroitin 500-400 MG tablet Take 1 tablet by mouth daily. Reported on 02/24/2016    . Multiple Vitamin (MULTIVITAMIN) capsule Take 1 capsule by mouth daily. Reported on 02/24/2016    . Omega-3 Fatty Acids (FISH OIL) 1000 MG CAPS Take 1 capsule by mouth daily. Reported on 02/24/2016     No current facility-administered medications for this visit.   Facility-Administered Medications Ordered in  Other Visits  Medication Dose Route Frequency Provider Last Rate Last Dose  . sodium chloride 0.9 % injection 10 mL  10 mL Intracatheter PRN Janese Banks, MD   10 mL at 05/27/15 1543  . sodium chloride 0.9 % injection 10 mL  10 mL Intracatheter PRN Janese Banks, MD   10 mL at 06/17/15 1510  . sodium chloride 0.9 % injection 10 mL  10 mL Intravenous PRN Johney Maine, MD   10 mL at 07/08/15 1340    PHYSICAL EXAMINATION: ECOG PERFORMANCE STATUS: 0 - Asymptomatic  BP 115/79 mmHg  Pulse 66  Temp(Src) 97.4 F (36.3 C) (Tympanic)  Resp 18  Ht 5\' 10"  (1.778 m)  Wt 197 lb 8.5 oz (89.6 kg)  BMI 28.34  kg/m2  Filed Weights   02/24/16 1406  Weight: 197 lb 8.5 oz (89.6 kg)    GENERAL: Well-nourished well-developed; Alert, [name]; good dentition  NECK: supple, [name], [name]; 1+ bilateral lower extremity swelling. ABDOMEN:abdomen soft, [name]  LABORATORY DATA:  I have reviewed the data as listed    Component Value Date/Time   NA 131* 02/24/2016 1333   NA 138 12/10/2014 1341   K 3.8 02/24/2016 1333   K 3.6 12/10/2014 1341   CL 97* 02/24/2016 1333   CL 102 12/10/2014 1341   CO2 28 02/24/2016 1333   CO2 30 12/10/2014 1341   GLUCOSE 135* 02/24/2016 1333   GLUCOSE 160* 12/10/2014 1341   BUN 27* 02/24/2016 1333   BUN 21* 12/10/2014 1341   CREATININE 0.98 02/24/2016 1333   CREATININE 1.10 03/25/2015 1453   CALCIUM 8.8* 02/24/2016 1333   CALCIUM 8.4* 12/10/2014 1341   PROT 7.1 02/24/2016 1333   PROT 6.6 12/10/2014 1341   ALBUMIN 4.1 02/24/2016 1333   ALBUMIN 3.4 12/10/2014 1341   AST 32 02/24/2016 1333   AST 17 12/10/2014 1341   ALT 22 02/24/2016 1333   ALT 23 12/10/2014 1341   ALKPHOS 44 02/24/2016 1333   ALKPHOS 50 12/10/2014 1341   BILITOT 0.8 02/24/2016 1333   BILITOT 0.5 12/10/2014 1341   GFRNONAA >60 02/24/2016 1333   GFRNONAA >60 03/25/2015 1453   GFRNONAA >60 12/31/2014 1346   GFRAA >60 02/24/2016 1333   GFRAA >60 03/25/2015 1453   GFRAA >60 12/31/2014 1346    No results found for: SPEP, UPEP  Lab Results  Component Value Date   WBC 6.3 02/24/2016   NEUTROABS 4.1  02/24/2016   HGB 13.3 02/24/2016   HCT 38.1* 02/24/2016   MCV 92.2 02/24/2016   PLT 152 02/24/2016      Chemistry      Component Value Date/Time   NA 131* 02/24/2016 1333   NA 138 12/10/2014 1341   K 3.8 02/24/2016 1333   K 3.6 12/10/2014 1341   CL 97* 02/24/2016 1333   CL 102 12/10/2014 1341   CO2 28 02/24/2016 1333   CO2 30 12/10/2014 1341   BUN 27* 02/24/2016 1333   BUN 21* 12/10/2014 1341   CREATININE 0.98 02/24/2016 1333   CREATININE  1.10 03/25/2015 1453      Component Value Date/Time   CALCIUM 8.8* 02/24/2016 1333   CALCIUM 8.4* 12/10/2014 1341   ALKPHOS 44 02/24/2016 1333   ALKPHOS 50 12/10/2014 1341   AST 32 02/24/2016 1333   AST 17 12/10/2014 1341   ALT 22 02/24/2016 1333   ALT 23 12/10/2014 1341   BILITOT 0.8 02/24/2016 1333   BILITOT 0.5 12/10/2014 1341         ASSESSMENT & PLAN:   # Metastatic adenocarcinoma the lung- currently on maintenance Alimta/on protocol; June 2012. FEB 2017- CT scan-chest abdomen pelvis does not show any any evidence of recurrence/progression.  # Clinically no concerns for obvious progression at this time. Patient tolerating chemotherapy well. Continue Alimta every 3 weeks. Patient is on folic QVOH/C09 injections. CBC within normal limits; creatinine 0.98  # Mild swelling in the legs-currently improved; none at this time;  continues to be on Lasix/spirinolactone.    We will continue to monitor for now.  # Follow up in approximately 3 weeks/ labs M.D. Visit/ discussed with clinical trials RN.     [name], MD 02/24/2016 2:19 PM

## 2016-02-24 NOTE — Progress Notes (Signed)
Pt here for treatment today. He cont. To have swelling of lower ext. Cont. To take diurectics.  Has a system that he does stool softners around the treatment each time. Drinking and eating well without problems.

## 2016-03-16 ENCOUNTER — Inpatient Hospital Stay (HOSPITAL_BASED_OUTPATIENT_CLINIC_OR_DEPARTMENT_OTHER): Payer: BLUE CROSS/BLUE SHIELD | Admitting: Internal Medicine

## 2016-03-16 ENCOUNTER — Encounter: Payer: Self-pay | Admitting: *Deleted

## 2016-03-16 ENCOUNTER — Inpatient Hospital Stay: Payer: BLUE CROSS/BLUE SHIELD | Attending: Internal Medicine

## 2016-03-16 ENCOUNTER — Inpatient Hospital Stay: Payer: BLUE CROSS/BLUE SHIELD

## 2016-03-16 VITALS — BP 123/83 | HR 72 | Temp 97.3°F | Wt 190.8 lb

## 2016-03-16 DIAGNOSIS — Z5111 Encounter for antineoplastic chemotherapy: Secondary | ICD-10-CM | POA: Diagnosis not present

## 2016-03-16 DIAGNOSIS — C3491 Malignant neoplasm of unspecified part of right bronchus or lung: Secondary | ICD-10-CM

## 2016-03-16 DIAGNOSIS — R634 Abnormal weight loss: Secondary | ICD-10-CM

## 2016-03-16 DIAGNOSIS — C3492 Malignant neoplasm of unspecified part of left bronchus or lung: Secondary | ICD-10-CM

## 2016-03-16 DIAGNOSIS — C7989 Secondary malignant neoplasm of other specified sites: Secondary | ICD-10-CM | POA: Diagnosis not present

## 2016-03-16 DIAGNOSIS — Z880 Allergy status to penicillin: Secondary | ICD-10-CM

## 2016-03-16 DIAGNOSIS — Z79899 Other long term (current) drug therapy: Secondary | ICD-10-CM

## 2016-03-16 DIAGNOSIS — I38 Endocarditis, valve unspecified: Secondary | ICD-10-CM

## 2016-03-16 DIAGNOSIS — Z87891 Personal history of nicotine dependence: Secondary | ICD-10-CM | POA: Diagnosis not present

## 2016-03-16 DIAGNOSIS — Z006 Encounter for examination for normal comparison and control in clinical research program: Secondary | ICD-10-CM | POA: Diagnosis not present

## 2016-03-16 DIAGNOSIS — R600 Localized edema: Secondary | ICD-10-CM | POA: Diagnosis not present

## 2016-03-16 LAB — CBC WITH DIFFERENTIAL/PLATELET
BASOS PCT: 1 %
Basophils Absolute: 0 10*3/uL (ref 0–0.1)
Eosinophils Absolute: 0.1 10*3/uL (ref 0–0.7)
Eosinophils Relative: 3 %
HEMATOCRIT: 38.1 % — AB (ref 40.0–52.0)
HEMOGLOBIN: 13.3 g/dL (ref 13.0–18.0)
Lymphocytes Relative: 30 %
Lymphs Abs: 1.1 10*3/uL (ref 1.0–3.6)
MCH: 32.4 pg (ref 26.0–34.0)
MCHC: 34.8 g/dL (ref 32.0–36.0)
MCV: 93.2 fL (ref 80.0–100.0)
Monocytes Absolute: 0.4 10*3/uL (ref 0.2–1.0)
Monocytes Relative: 12 %
NEUTROS ABS: 2.1 10*3/uL (ref 1.4–6.5)
NEUTROS PCT: 54 %
PLATELETS: 159 10*3/uL (ref 150–440)
RBC: 4.09 MIL/uL — AB (ref 4.40–5.90)
RDW: 13.9 % (ref 11.5–14.5)
WBC: 3.8 10*3/uL (ref 3.8–10.6)

## 2016-03-16 LAB — COMPREHENSIVE METABOLIC PANEL
ALK PHOS: 40 U/L (ref 38–126)
ALT: 23 U/L (ref 17–63)
AST: 37 U/L (ref 15–41)
Albumin: 4.2 g/dL (ref 3.5–5.0)
Anion gap: 6 (ref 5–15)
BILIRUBIN TOTAL: 1 mg/dL (ref 0.3–1.2)
BUN: 25 mg/dL — AB (ref 6–20)
CALCIUM: 8.9 mg/dL (ref 8.9–10.3)
CO2: 28 mmol/L (ref 22–32)
CREATININE: 1.12 mg/dL (ref 0.61–1.24)
Chloride: 98 mmol/L — ABNORMAL LOW (ref 101–111)
Glucose, Bld: 125 mg/dL — ABNORMAL HIGH (ref 65–99)
Potassium: 3.7 mmol/L (ref 3.5–5.1)
Sodium: 132 mmol/L — ABNORMAL LOW (ref 135–145)
TOTAL PROTEIN: 6.9 g/dL (ref 6.5–8.1)

## 2016-03-16 MED ORDER — SODIUM CHLORIDE 0.9 % IV SOLN
Freq: Once | INTRAVENOUS | Status: AC
  Administered 2016-03-16: 11:00:00 via INTRAVENOUS
  Filled 2016-03-16: qty 4

## 2016-03-16 MED ORDER — SODIUM CHLORIDE 0.9 % IV SOLN
Freq: Once | INTRAVENOUS | Status: AC
  Administered 2016-03-16: 11:00:00 via INTRAVENOUS
  Filled 2016-03-16: qty 1000

## 2016-03-16 MED ORDER — HEPARIN SOD (PORK) LOCK FLUSH 100 UNIT/ML IV SOLN
500.0000 [IU] | Freq: Once | INTRAVENOUS | Status: AC | PRN
  Administered 2016-03-16: 500 [IU]
  Filled 2016-03-16: qty 5

## 2016-03-16 MED ORDER — INV-PEMETREXED CHEMO INJECTION 500 MG LILLY S130
500.0000 mg/m2 | Freq: Once | INTRAVENOUS | Status: AC
  Administered 2016-03-16: 1050 mg via INTRAVENOUS
  Filled 2016-03-16: qty 42

## 2016-03-16 NOTE — Progress Notes (Signed)
Pt gets B12 injection every 2 months.

## 2016-03-16 NOTE — Progress Notes (Signed)
Patient ambulates without assistance, vitals documented, denies pain or discomfort.  Medication record updated, information provided by patient.

## 2016-03-16 NOTE — Progress Notes (Signed)
[facility] PROGRESS NOTE  Patient Care Team: [facility]., MD as PCP - General (Family Medicine)   SUMMARY OF ONCOLOGIC HISTORY:  # 2012- METASTATIC ADENO CA of LEFT LUNG [acinar pattern] s/p MED LN Bx [NEG- EGFR/K-ras/ALK mutation];[name]; [name]; CT FEB  2016- NED  # Thoracic Aneurysm [4cm- stable]   INTERVAL HISTORY: [name] with above history of metastatic adenocarcinoma the lung currently on maintenance Alimta since June 2012 is here for follow-up prior to his chemotherapy.   Patient denies any nausea vomiting. Denies any headaches. Denies any skin rash. Denies any swelling in the legs. Patient denies any cough or shortness of breath or chest pain. Denies any headaches. Denies any skin rash. His appetite is good. He is more physically active. He has lost about 7 pounds.  REVIEW OF SYSTEMS:  A complete 10 point review of system is done which is negative except mentioned above/history of present illness.   PAST MEDICAL HISTORY :  Past Medical History  Diagnosis Date  . Allergy   . Mild valvular heart disease Nov. 2014    per 2 D Echocardiogram done for persistent leg edema, monitored by cardiology  . Lung cancer ([place])   . Peripheral edema     PAST SURGICAL HISTORY :   Past Surgical History  Procedure Laterality Date  . Elbow surgery    . Left anterior thoracotomy with biopsy  March 2012  . Portacath placement  March 2012    FAMILY HISTORY :   Family History  Problem Relation Age of Onset  . Leukemia Father     SOCIAL HISTORY:   Social History  Substance Use Topics  . Smoking status: Former Smoker    Types: Cigarettes  . Smokeless tobacco: Never Used  . Alcohol Use: 0.0 oz/week    0 Standard drinks or equivalent per week     Comment: "social drinker"     ALLERGIES:  is allergic to  penicillins.  MEDICATIONS:  Current Outpatient Prescriptions  Medication Sig Dispense Refill  . folic acid (FOLVITE) 1 MG tablet Take 1 tablet (1 mg total) by mouth daily. 30 tablet 6  . furosemide (LASIX) 20 MG tablet Take 1 tablet (20 mg total) by mouth daily. One tablet daily as needed for swelling 90 tablet 1  . glucosamine-chondroitin 500-400 MG tablet Take 1 tablet by mouth daily. Reported on 02/24/2016    . meclizine (ANTIVERT) 25 MG tablet Take 1 tablet (25 mg total) by mouth 3 (three) times daily as needed for dizziness. 60 tablet 0  . meloxicam (MOBIC) 7.5 MG tablet Take 7.5 mg by mouth daily.    . Multiple Vitamin (MULTIVITAMIN) capsule Take 1 capsule by mouth daily. Reported on 02/24/2016    . Omega-3 Fatty Acids (FISH OIL) 1000 MG CAPS Take 1 capsule by mouth daily. Reported on 02/24/2016    . ondansetron (ZOFRAN-ODT) 4 MG disintegrating tablet Take 1 tablet (4 mg total) by mouth every 4 (four) hours as needed for nausea or vomiting. 20 tablet 2  . senna (SENOKOT) 8.6 MG tablet Take 1 tablet by mouth daily. Take as needed around time of chemotherapy    . spironolactone (ALDACTONE) 25 MG tablet Take 1 tablet (25 mg total) by mouth daily. 90 tablet 1  . vitamin B-12 (CYANOCOBALAMIN) 1000 MCG tablet Take 1,000 mcg by mouth daily.     No current facility-administered  medications for this visit.   Facility-Administered Medications Ordered in Other Visits  Medication Dose Route Frequency Provider Last Rate Last Dose  . sodium chloride 0.9 % injection 10 mL  10 mL Intracatheter PRN [name], MD   10 mL at 05/27/15 1543  . sodium chloride 0.9 % injection 10 mL  10 mL Intracatheter PRN [name], MD   10 mL at 06/17/15 1510  . sodium chloride 0.9 % injection 10 mL  10 mL Intravenous PRN [name], MD   10 mL at 07/08/15 1340    PHYSICAL EXAMINATION: ECOG PERFORMANCE STATUS: 0 - Asymptomatic  BP 123/83 mmHg  Pulse 72  Temp(Src) 97.3 F (36.3 C) (Tympanic)  Wt 190 lb  12.9 oz (86.55 kg)  Filed Weights   03/16/16 1011  Weight: 190 lb 12.9 oz (86.55 kg)    GENERAL: Well-nourished well-developed; Alert, [name]; good dentition  NECK: supple, [name], [name]; 1+ bilateral lower extremity swelling. ABDOMEN:abdomen soft, [name]  LABORATORY DATA:  I have reviewed the data as listed    Component Value Date/Time   NA 132* 03/16/2016 0821   NA 138 12/10/2014 1341   K 3.7 03/16/2016 0821   K 3.6 12/10/2014 1341   CL 98* 03/16/2016 0821   CL 102 12/10/2014 1341   CO2 28 03/16/2016 0821   CO2 30 12/10/2014 1341   GLUCOSE 125* 03/16/2016 0821   GLUCOSE 160* 12/10/2014 1341   BUN 25* 03/16/2016 0821   BUN 21* 12/10/2014 1341   CREATININE 1.12 03/16/2016 0821   CREATININE 1.10 03/25/2015 1453   CALCIUM 8.9 03/16/2016 0821   CALCIUM 8.4* 12/10/2014 1341   PROT 6.9 03/16/2016 0821   PROT 6.6 12/10/2014 1341   ALBUMIN 4.2 03/16/2016 0821   ALBUMIN 3.4 12/10/2014 1341   AST 37 03/16/2016 0821   AST 17 12/10/2014 1341   ALT 23 03/16/2016 0821   ALT 23 12/10/2014 1341   ALKPHOS 40 03/16/2016 0821   ALKPHOS 50 12/10/2014 1341   BILITOT 1.0 03/16/2016 0821   BILITOT 0.5 12/10/2014 1341   GFRNONAA >60 03/16/2016 0821   GFRNONAA >60 03/25/2015 1453   GFRNONAA >60 12/31/2014 1346   GFRAA >60 03/16/2016 0821   GFRAA >60 03/25/2015 1453   GFRAA >60 12/31/2014 1346    No results found for: SPEP, UPEP  Lab Results  Component Value Date   WBC 3.8 03/16/2016    NEUTROABS 2.1 03/16/2016   HGB 13.3 03/16/2016   HCT 38.1* 03/16/2016   MCV 93.2 03/16/2016   PLT 159 03/16/2016      Chemistry      Component Value Date/Time   NA 132* 03/16/2016 0821   NA 138 12/10/2014 1341   K 3.7 03/16/2016 0821   K 3.6 12/10/2014 1341   CL 98* 03/16/2016 0821   CL 102 12/10/2014 1341   CO2 28 03/16/2016 0821   CO2 30 12/10/2014 1341   BUN 25* 03/16/2016 0821   BUN 21* 12/10/2014 1341   CREATININE 1.12 03/16/2016 0821   CREATININE 1.10 03/25/2015 1453  Component Value Date/Time   CALCIUM 8.9 03/16/2016 0821   CALCIUM 8.4* 12/10/2014 1341   ALKPHOS 40 03/16/2016 0821   ALKPHOS 50 12/10/2014 1341   AST 37 03/16/2016 0821   AST 17 12/10/2014 1341   ALT 23 03/16/2016 0821   ALT 23 12/10/2014 1341   BILITOT 1.0 03/16/2016 0821   BILITOT 0.5 12/10/2014 1341       ASSESSMENT & PLAN:   # Metastatic adenocarcinoma the lung- currently on maintenance Alimta/on protocol; June 2012. FEB 2017- CT scan-chest abdomen pelvis does not show any any evidence of recurrence/progression.  # Continue Alimta chemotherapy maintenance. CBC CMP adequate.   # 7 pounds weight loss intentional. Monitor for now.  # Mild swelling in the legs-currently improved; none at this time;  continues to be on Lasix/spirinolactone.   # Follow up in approximately 3 weeks/ labs M.D. Visit/ discussed with clinical trials RN.     [name], MD 03/16/2016 10:26 AM

## 2016-03-16 NOTE — Progress Notes (Signed)
[facility] returns to clinic this morning for consideration of cycle 584 of Alimta on the [employer] study. Patient has lost about 7 lbs and states this was intentional due to playing a lot more tennis lately. Informed that we will need to dose reduce the Alimta if he loses another 1.5kg Dr. Rogue Bussing in to examine patient and reviewed all labs as well. He continues to report a trace of edema in his feet and ankles. Sodium level remains a little low, but is about the same as usual. Patient continues to report some mild fatigue which begins the next day after his Alimta infusion and lasts for 2-3 days before resolving.  Vital signs stable. CBC and chemistries all within acceptable parameters to proceed with Alimta infusion as scheduled. The patient will return to clinic in 3 weeks - 04/06/16 for next cycle of Alimta. Return appt requested. AE's with grade and attribution as follows:  Adverse Event Log  Study/Protocol: [name] S130 Cycle: #584  Event Grade Onset Date Resolved Date Drug Name Attribution Treatment Comments  Edema (mild) Grade 1 05/30/2012 ongoing Alimta Possible None   Hyponatremia Grade 1 09/28/2015 ongoing Alimta Possible None   Fatigue Grade 1 02/03/2016 ongoing Alimta Possible None   Yolande Jolly, BSN, MHA, OCN 03/16/2016 10:28 AM

## 2016-03-19 DIAGNOSIS — N529 Male erectile dysfunction, unspecified: Secondary | ICD-10-CM | POA: Insufficient documentation

## 2016-03-20 ENCOUNTER — Encounter: Payer: Self-pay | Admitting: Family Medicine

## 2016-04-05 ENCOUNTER — Ambulatory Visit (INDEPENDENT_AMBULATORY_CARE_PROVIDER_SITE_OTHER): Payer: BLUE CROSS/BLUE SHIELD | Admitting: Family Medicine

## 2016-04-05 ENCOUNTER — Encounter: Payer: Self-pay | Admitting: Family Medicine

## 2016-04-05 VITALS — BP 112/62 | Temp 98.4°F | Resp 16 | Ht 70.0 in | Wt 194.0 lb

## 2016-04-05 DIAGNOSIS — Z1211 Encounter for screening for malignant neoplasm of colon: Secondary | ICD-10-CM

## 2016-04-05 DIAGNOSIS — Z0000 Encounter for general adult medical examination without abnormal findings: Secondary | ICD-10-CM | POA: Diagnosis not present

## 2016-04-05 DIAGNOSIS — Z125 Encounter for screening for malignant neoplasm of prostate: Secondary | ICD-10-CM | POA: Diagnosis not present

## 2016-04-05 LAB — POCT URINALYSIS DIPSTICK
BILIRUBIN UA: NEGATIVE
GLUCOSE UA: NEGATIVE
Ketones, UA: NEGATIVE
Leukocytes, UA: NEGATIVE
NITRITE UA: NEGATIVE
PROTEIN UA: NEGATIVE
RBC UA: NEGATIVE
SPEC GRAV UA: 1.01
Urobilinogen, UA: 0.2
pH, UA: 6.5

## 2016-04-05 NOTE — Progress Notes (Addendum)
Patient ID: [facility], male   DOB: 30-May-1961, 55 y.o.   MRN: [id number]         Patient: [facility], Male    DOB: [birth year], 55 y.o.   MRN: [id number] Visit Date: 04/05/2016  Today's Provider: Wilhemena Durie, MD   Chief Complaint  Patient presents with  . Annual Exam   Subjective:    Annual physical exam Mitchell [facility] is a 55 y.o. male who presents today for health maintenance and complete physical. He feels well. He reports exercising occasionally. He reports he is sleeping well.  Colonoscopy- never. Tdap- 03/16/2015.    Review of Systems  Constitutional: Negative.   HENT: Negative.   Eyes: Negative.   Respiratory: Negative.   Cardiovascular: Negative.   Gastrointestinal: Negative.   Endocrine: Negative.   Genitourinary: Negative.   Musculoskeletal: Negative.   Skin: Negative.   Allergic/Immunologic: Negative.   Neurological: Negative.   Hematological: Negative.   Psychiatric/Behavioral: Negative.     Social History      He  reports that he has quit smoking. His smoking use included Cigarettes. He has never used smokeless tobacco. He reports that he drinks alcohol. He reports that he does not use illicit drugs.       Social History   Social History  . Marital Status: Married    Spouse Name: N/A  . Number of Children: N/A  . Years of Education: N/A   Social History Main Topics  . Smoking status: Former Smoker    Types: Cigarettes  . Smokeless tobacco: Never Used  . Alcohol Use: 0.0 oz/week    0 Standard drinks or equivalent per week     Comment: "social drinker"   . Drug Use: No  . Sexual Activity: Yes   Other Topics Concern  . Not on file   Social History Narrative    Past Medical History  Diagnosis Date  . Allergy   . Mild valvular heart disease Nov. 2014    per 2 D Echocardiogram done for persistent leg edema, monitored by cardiology  . Lung cancer ([place])   . Peripheral edema      Patient Active Problem List   Diagnosis Date Noted  . ED (erectile dysfunction) of organic origin 03/19/2016  . Lung cancer ([place]) 10/21/2015  . Non-small cell carcinoma of right lung, stage 4 ([place]) 04/05/2015  . Edema, peripheral 04/09/2014  . Carcinoma of lung ([place]) 04/09/2014    Past Surgical History  Procedure Laterality Date  . Elbow surgery    . Left anterior thoracotomy with biopsy  March 2012  . Portacath placement  March 2012  . Tonsillectomy      Family History        Family Status  Relation Status Death Age  . Father Deceased 83  . Mother Alive   . Sister Alive   . Brother Alive   . Brother Alive   . Brother Alive         His family history includes Diabetes in his mother; Leukemia in his father.    Allergies  Allergen Reactions  . Penicillins     Reaction and severity unknown    Previous Medications   FOLIC ACID (FOLVITE) 1 MG TABLET    Take 1 tablet (1 mg total) by mouth daily.   FUROSEMIDE (LASIX) 20 MG TABLET    Take 1 tablet (20 mg total) by mouth daily. One tablet daily as needed for swelling   GLUCOSAMINE-CHONDROITIN 500-400 MG TABLET  Take 1 tablet by mouth daily. Reported on 02/24/2016   MECLIZINE (ANTIVERT) 25 MG TABLET    Take 1 tablet (25 mg total) by mouth 3 (three) times daily as needed for dizziness.   MELOXICAM (MOBIC) 7.5 MG TABLET    Take 7.5 mg by mouth daily.   MULTIPLE VITAMIN (MULTIVITAMIN) CAPSULE    Take 1 capsule by mouth daily. Reported on 02/24/2016   OMEGA-3 FATTY ACIDS (FISH OIL) 1000 MG CAPS    Take 1 capsule by mouth daily. Reported on 02/24/2016   ONDANSETRON (ZOFRAN-ODT) 4 MG DISINTEGRATING TABLET    Take 1 tablet (4 mg total) by mouth every 4 (four) hours as needed for nausea or vomiting.   SENNA (SENOKOT) 8.6 MG TABLET    Take 1 tablet by mouth daily. Take as needed around time of chemotherapy   SPIRONOLACTONE (ALDACTONE) 25 MG TABLET    Take 1 tablet (25 mg total) by mouth daily.   VITAMIN B-12 (CYANOCOBALAMIN) 1000 MCG TABLET    Take 1,000 mcg by mouth  daily.    Patient Care Team: [name], MD as PCP - General (Family Medicine)     Objective:   Vitals: There were no vitals taken for this visit.   Physical Exam  Constitutional: He is oriented to person, place, and time. He appears well-developed and well-nourished.  Well-nourished well-developed [place] male in very good physical shape  HENT:  Head: Normocephalic and atraumatic.  Right Ear: External ear normal.  Left Ear: External ear normal.  Nose: Nose normal.  Mouth/Throat: Oropharynx is clear and moist.  Eyes: Conjunctivae and EOM are normal. Pupils are equal, round, and reactive to light.  Neck: Neck supple. No thyromegaly present.  Cardiovascular: Normal rate, regular rhythm, normal heart sounds and intact distal pulses.   Pulmonary/Chest: Effort normal and breath sounds normal.  Port-A-Cath still in place in left anterior chest wall  Abdominal: Soft. He exhibits no mass. There is no tenderness.  Musculoskeletal: He exhibits edema.  1+ lower extremity edema  Lymphadenopathy:    He has no cervical adenopathy.  Neurological: He is alert and oriented to person, place, and time. He has normal reflexes. No cranial nerve deficit. He exhibits normal muscle tone. Coordination normal.  Skin: Skin is warm and dry.  Psychiatric: He has a normal mood and affect. His behavior is normal. Judgment and thought content normal.     Depression Screen No flowsheet data found.    Assessment & Plan:     Routine Health Maintenance and Physical Exam  Exercise Activities and Dietary recommendations Goals    None      Immunization History  Administered Date(s) Administered  . Influenza,inj,Quad PF,36+ Mos 08/19/2015  . Tdap 03/16/2015    Health Maintenance  Topic Date Due  . Hepatitis C Screening  1961/08/13  . HIV Screening  06/10/1977  . COLONOSCOPY  06/10/2012  . INFLUENZA VACCINE  07/03/2016  . TETANUS/TDAP  03/15/2025     Patient willing now to go  through with screening colonoscopy at age 18. Discussed health benefits of physical activity, and encouraged him to engage in regular exercise appropriate for his age and condition.              Metastatic adenocarcinoma of the lung since 2012             In remission. On clinical trial.             Chronic right elbow and left knee pain with left  knee effusion              Bilateral Pterygium             ED             Rx Sildenafil as it is now generic. Cialis worked well for him. I have done the exam and reviewed the above chart and it is accurate to the best of my knowledge.  I have done the exam and reviewed the above chart and it is accurate to the best of my knowledge.  --------------------------------------------------------------------

## 2016-04-06 ENCOUNTER — Inpatient Hospital Stay: Payer: BLUE CROSS/BLUE SHIELD

## 2016-04-06 ENCOUNTER — Inpatient Hospital Stay: Payer: BLUE CROSS/BLUE SHIELD | Attending: Internal Medicine

## 2016-04-06 ENCOUNTER — Encounter: Payer: Self-pay | Admitting: *Deleted

## 2016-04-06 ENCOUNTER — Inpatient Hospital Stay (HOSPITAL_BASED_OUTPATIENT_CLINIC_OR_DEPARTMENT_OTHER): Payer: BLUE CROSS/BLUE SHIELD | Admitting: Internal Medicine

## 2016-04-06 VITALS — BP 111/75 | HR 60 | Temp 96.0°F | Resp 18 | Wt 191.1 lb

## 2016-04-06 DIAGNOSIS — Z006 Encounter for examination for normal comparison and control in clinical research program: Secondary | ICD-10-CM | POA: Insufficient documentation

## 2016-04-06 DIAGNOSIS — M7989 Other specified soft tissue disorders: Secondary | ICD-10-CM

## 2016-04-06 DIAGNOSIS — C3491 Malignant neoplasm of unspecified part of right bronchus or lung: Secondary | ICD-10-CM

## 2016-04-06 DIAGNOSIS — I38 Endocarditis, valve unspecified: Secondary | ICD-10-CM | POA: Insufficient documentation

## 2016-04-06 DIAGNOSIS — Z79899 Other long term (current) drug therapy: Secondary | ICD-10-CM | POA: Diagnosis not present

## 2016-04-06 DIAGNOSIS — Z5111 Encounter for antineoplastic chemotherapy: Secondary | ICD-10-CM | POA: Insufficient documentation

## 2016-04-06 DIAGNOSIS — C3492 Malignant neoplasm of unspecified part of left bronchus or lung: Secondary | ICD-10-CM

## 2016-04-06 DIAGNOSIS — M549 Dorsalgia, unspecified: Secondary | ICD-10-CM | POA: Insufficient documentation

## 2016-04-06 DIAGNOSIS — R600 Localized edema: Secondary | ICD-10-CM

## 2016-04-06 DIAGNOSIS — E871 Hypo-osmolality and hyponatremia: Secondary | ICD-10-CM | POA: Insufficient documentation

## 2016-04-06 DIAGNOSIS — I712 Thoracic aortic aneurysm, without rupture: Secondary | ICD-10-CM

## 2016-04-06 DIAGNOSIS — E860 Dehydration: Secondary | ICD-10-CM | POA: Insufficient documentation

## 2016-04-06 DIAGNOSIS — R300 Dysuria: Secondary | ICD-10-CM | POA: Insufficient documentation

## 2016-04-06 DIAGNOSIS — N390 Urinary tract infection, site not specified: Secondary | ICD-10-CM | POA: Insufficient documentation

## 2016-04-06 DIAGNOSIS — D696 Thrombocytopenia, unspecified: Secondary | ICD-10-CM | POA: Insufficient documentation

## 2016-04-06 LAB — COMPREHENSIVE METABOLIC PANEL
ALT: 21 U/L (ref 17–63)
AST: 28 U/L (ref 15–41)
Albumin: 4.1 g/dL (ref 3.5–5.0)
Alkaline Phosphatase: 41 U/L (ref 38–126)
Anion gap: 7 (ref 5–15)
BUN: 31 mg/dL — AB (ref 6–20)
CHLORIDE: 102 mmol/L (ref 101–111)
CO2: 27 mmol/L (ref 22–32)
CREATININE: 1.13 mg/dL (ref 0.61–1.24)
Calcium: 9.1 mg/dL (ref 8.9–10.3)
GFR calc Af Amer: >60 mL/min (ref >60)
GFR calc non Af Amer: >60 mL/min (ref >60)
Glucose, Bld: 141 mg/dL — ABNORMAL HIGH (ref 65–99)
POTASSIUM: 3.9 mmol/L (ref 3.5–5.1)
SODIUM: 136 mmol/L (ref 135–145)
Total Bilirubin: 0.7 mg/dL (ref 0.3–1.2)
Total Protein: 6.6 g/dL (ref 6.5–8.1)

## 2016-04-06 LAB — CBC WITH DIFFERENTIAL/PLATELET
BASOS ABS: 0 10*3/uL (ref 0–0.1)
BASOS PCT: 1 %
EOS ABS: 0.1 10*3/uL (ref 0–0.7)
EOS PCT: 3 %
HCT: 39.7 % — ABNORMAL LOW (ref 40.0–52.0)
Hemoglobin: 13.6 g/dL (ref 13.0–18.0)
Lymphocytes Relative: 29 %
Lymphs Abs: 1.4 10*3/uL (ref 1.0–3.6)
MCH: 31.9 pg (ref 26.0–34.0)
MCHC: 34.3 g/dL (ref 32.0–36.0)
MCV: 92.9 fL (ref 80.0–100.0)
Monocytes Absolute: 0.4 10*3/uL (ref 0.2–1.0)
Monocytes Relative: 8 %
Neutro Abs: 3 10*3/uL (ref 1.4–6.5)
Neutrophils Relative %: 61 %
PLATELETS: 156 10*3/uL (ref 150–440)
RBC: 4.28 MIL/uL — AB (ref 4.40–5.90)
RDW: 13.7 % (ref 11.5–14.5)
WBC: 4.9 10*3/uL (ref 3.8–10.6)

## 2016-04-06 MED ORDER — SODIUM CHLORIDE 0.9 % IV SOLN
Freq: Once | INTRAVENOUS | Status: AC
  Administered 2016-04-06: 15:00:00 via INTRAVENOUS
  Filled 2016-04-06: qty 4

## 2016-04-06 MED ORDER — CYANOCOBALAMIN 1000 MCG/ML IJ SOLN
1000.0000 ug | Freq: Once | INTRAMUSCULAR | Status: AC
  Administered 2016-04-06: 1000 ug via INTRAMUSCULAR
  Filled 2016-04-06: qty 1

## 2016-04-06 MED ORDER — SODIUM CHLORIDE 0.9 % IV SOLN
500.0000 mg/m2 | Freq: Once | INTRAVENOUS | Status: AC
  Administered 2016-04-06: 1050 mg via INTRAVENOUS
  Filled 2016-04-06: qty 42

## 2016-04-06 MED ORDER — SODIUM CHLORIDE 0.9 % IV SOLN
Freq: Once | INTRAVENOUS | Status: AC
  Administered 2016-04-06: 15:00:00 via INTRAVENOUS
  Filled 2016-04-06: qty 1000

## 2016-04-06 MED ORDER — SODIUM CHLORIDE 0.9 % IJ SOLN
10.0000 mL | INTRAMUSCULAR | Status: DC | PRN
  Filled 2016-04-06: qty 10

## 2016-04-06 MED ORDER — HEPARIN SOD (PORK) LOCK FLUSH 100 UNIT/ML IV SOLN
500.0000 [IU] | Freq: Once | INTRAVENOUS | Status: AC | PRN
  Administered 2016-04-06: 500 [IU]
  Filled 2016-04-06: qty 5

## 2016-04-06 NOTE — Progress Notes (Signed)
Mitchell Mosley OFFICE PROGRESS NOTE  Patient Care Team: Mitchell Mosley., MD as PCP - General (Family Medicine)   SUMMARY OF ONCOLOGIC HISTORY:  # 2012- METASTATIC ADENO CA of LEFT LUNG [acinar pattern] s/p MED LN Bx [NEG- EGFR/K-ras/ALK mutation];[name]; [name]; CT FEB  2016- NED  # Thoracic Aneurysm [4cm- stable]   INTERVAL HISTORY: [name] with above history of metastatic adenocarcinoma the lung currently on maintenance Alimta since June 2012 is here for follow-up prior to his chemotherapy.   Continues to be very active. He recently had trauma to the left knee while playing a tennis match.   Patient denies any nausea vomiting. Denies any headaches. Denies any skin rash. Denies any swelling in the legs. Patient denies any cough or shortness of breath or chest pain. Denies any headaches. Denies any skin rash. His appetite is good.   REVIEW OF SYSTEMS:  A complete 10 point review of system is done which is negative except mentioned above/history of present illness.   PAST MEDICAL HISTORY :  Past Medical History  Diagnosis Date  . Allergy   . Mild valvular heart disease Nov. 2014    per 2 D Echocardiogram done for persistent leg edema, monitored by cardiology  . Lung cancer ([place])   . Peripheral edema     PAST SURGICAL HISTORY :   Past Surgical History  Procedure Laterality Date  . Elbow surgery    . Left anterior thoracotomy with biopsy  March 2012  . Portacath placement  March 2012  . Tonsillectomy      FAMILY HISTORY :   Family History  Problem Relation Age of Onset  . Leukemia Father   . Diabetes Mother     SOCIAL HISTORY:   Social History  Substance Use Topics  . Smoking status: Never Smoker   . Smokeless tobacco: Never Used  . Alcohol Use: 0.0 oz/week    0 Standard drinks or equivalent per week   Comment: "social drinker"     ALLERGIES:  is allergic to penicillins.  MEDICATIONS:  Current Outpatient Prescriptions  Medication Sig Dispense Refill  . folic acid (FOLVITE) 1 MG tablet Take 1 tablet (1 mg total) by mouth daily. 30 tablet 6  . furosemide (LASIX) 20 MG tablet Take 1 tablet (20 mg total) by mouth daily. One tablet daily as needed for swelling 90 tablet 1  . glucosamine-chondroitin 500-400 MG tablet Take 1 tablet by mouth daily. Reported on 02/24/2016    . meclizine (ANTIVERT) 25 MG tablet Take 1 tablet (25 mg total) by mouth 3 (three) times daily as needed for dizziness. 60 tablet 0  . meloxicam (MOBIC) 7.5 MG tablet Take 7.5 mg by mouth daily.    . Multiple Vitamin (MULTIVITAMIN) capsule Take 1 capsule by mouth daily. Reported on 02/24/2016    . Omega-3 Fatty Acids (FISH OIL) 1000 MG CAPS Take 1 capsule by mouth daily. Reported on 02/24/2016    . ondansetron (ZOFRAN-ODT) 4 MG disintegrating tablet Take 1 tablet (4 mg total) by mouth every 4 (four) hours as needed for nausea or vomiting. 20 tablet 2  . senna (SENOKOT) 8.6 MG tablet Take 1 tablet by mouth daily. Take as needed around time of chemotherapy    . spironolactone (ALDACTONE) 25 MG tablet Take 1 tablet (25 mg total) by mouth daily. 90 tablet 1  . vitamin B-12 (CYANOCOBALAMIN) 1000 MCG tablet  Take 1,000 mcg by mouth daily.     No current facility-administered medications for this visit.   Facility-Administered Medications Ordered in Other Visits  Medication Dose Route Frequency Provider Last Rate Last Dose  . sodium chloride 0.9 % injection 10 mL  10 mL Intracatheter PRN Mitchell Alf, MD   10 mL at 05/27/15 1543  . sodium chloride 0.9 % injection 10 mL  10 mL Intracatheter PRN Mitchell Alf, MD   10 mL at 06/17/15 1510  . sodium chloride 0.9 % injection 10 mL  10 mL Intravenous PRN Mitchell Gleason, MD   10 mL at 07/08/15 1340    PHYSICAL EXAMINATION: ECOG PERFORMANCE STATUS: 0 - Asymptomatic  BP 111/75 mmHg  Pulse  60  Temp(Src) 96 F (35.6 C) (Tympanic)  Resp 18  Wt 191 lb 2.2 oz (86.7 kg)  Filed Weights   04/06/16 1435  Weight: 191 lb 2.2 oz (86.7 kg)    GENERAL: Well-nourished well-developed; Alert, [name]; good dentition  NECK: supple, [name], [name]; 1+ bilateral lower extremity swelling. ABDOMEN:abdomen soft, [name]; Ablation of the medial side of the left knee noted. Mild swelling of the left knee noted.  LABORATORY DATA:  I have reviewed the data as listed    Component Value Date/Time   NA 136 04/06/2016 1416   NA 138 12/10/2014 1341   K 3.9 04/06/2016 1416   K 3.6 12/10/2014 1341   CL 102 04/06/2016 1416   CL 102 12/10/2014 1341   CO2 27 04/06/2016 1416   CO2 30 12/10/2014 1341   GLUCOSE 141* 04/06/2016 1416   GLUCOSE 160* 12/10/2014 1341   BUN 31* 04/06/2016 1416   BUN 21* 12/10/2014 1341   CREATININE 1.13 04/06/2016 1416   CREATININE 1.10 03/25/2015 1453   CALCIUM 9.1 04/06/2016 1416   CALCIUM 8.4* 12/10/2014 1341   PROT 6.6 04/06/2016 1416   PROT 6.6 12/10/2014 1341   ALBUMIN 4.1 04/06/2016 1416   ALBUMIN 3.4 12/10/2014 1341   AST 28 04/06/2016 1416   AST 17 12/10/2014 1341   ALT 21 04/06/2016 1416   ALT 23 12/10/2014 1341   ALKPHOS 41 04/06/2016 1416   ALKPHOS 50 12/10/2014 1341   BILITOT 0.7 04/06/2016 1416   BILITOT 0.5 12/10/2014 1341   GFRNONAA >60 04/06/2016 1416   GFRNONAA >60 03/25/2015 1453   GFRNONAA >60 12/31/2014 1346   GFRAA >60 04/06/2016 1416   GFRAA >60  03/25/2015 1453   GFRAA >60 12/31/2014 1346    No results found for: SPEP, UPEP  Lab Results  Component Value Date   WBC 4.9 04/06/2016   NEUTROABS 3.0 04/06/2016   HGB 13.6 04/06/2016   HCT 39.7* 04/06/2016   MCV 92.9 04/06/2016   PLT 156 04/06/2016      Chemistry      Component Value Date/Time   NA 136 04/06/2016 1416   NA 138 12/10/2014 1341   K 3.9 04/06/2016 1416   K 3.6 12/10/2014 1341   CL 102 04/06/2016 1416   CL 102 12/10/2014 1341   CO2  27 04/06/2016 1416   CO2 30 12/10/2014 1341   BUN 31* 04/06/2016 1416   BUN 21* 12/10/2014 1341   CREATININE 1.13 04/06/2016 1416   CREATININE 1.10 03/25/2015 1453      Component Value Date/Time   CALCIUM 9.1 04/06/2016 1416   CALCIUM 8.4* 12/10/2014 1341   ALKPHOS 41 04/06/2016 1416   ALKPHOS 50 12/10/2014 1341   AST 28 04/06/2016 1416   AST 17 12/10/2014 1341   ALT 21 04/06/2016 1416   ALT 23 12/10/2014 1341   BILITOT 0.7 04/06/2016 1416   BILITOT 0.5 12/10/2014 1341       ASSESSMENT & PLAN:   # Metastatic adenocarcinoma the lung- currently on maintenance Alimta/on protocol; June 2012. Clinically no recurrence recurrence. # Continue Alimta chemotherapy maintenance. CBC CMP adequate.   # Mild swelling in the legs-currently improved; none at this time;  continues to be on Lasix/spirinolactone.   # Left knee swelling/ s- p fall; continue ice.  # Follow up in approximately 3 weeks/ labs M.D. Visit/ discussed with clinical trials RN.     [name], MD 04/06/2016 2:51 PM

## 2016-04-06 NOTE — Progress Notes (Signed)
Mitchell Mosley returns to clinic this afternoon for consideration of cycle #585 Alimta on the [employer] research study. He denies experiencing any new problems or side effects since last visit. Dr. Rogue Bussing in to examine patient and reviewed all labs as well. He continues to report a trace of edema in his feet and ankles. Patient continues to report some mild fatigue which begins the next day after his Alimta infusion and lasts for 2-3 days before resolving. He also reports that he fell while playing in a tennis tournament last weekend and has an abrasion on his left knee from this.  Vital signs remain stable. CBC and chemistries all within acceptable parameters to proceed with Alimta infusion as scheduled. He is scheduled to receive a B12 injection today per protocol. The patient will return to clinic in 3 weeks - 04/27/16 for next cycle of Alimta. Return appt requested. AE's with grade and attribution as follows:  Adverse Event Log  Study/Protocol: Eli Lilly S130 Cycle: #585  Event Grade Onset Date Resolved Date Drug Name Attribution Treatment Comments  Edema (mild) Grade 1 05/30/2012 ongoing Alimta Possible None   Abrasion to left knee Grade 1 03/31/16 resolving  unrelated none Fell playing tennis  Fatigue Grade 1 02/03/2016 ongoing Alimta Possible None   Yolande Jolly, BSN, MHA, OCN 04/06/2016 2:57 PM

## 2016-04-09 NOTE — Addendum Note (Signed)
Addended by: Miguel Aschoff on: 04/09/2016 05:57 PM   Modules accepted: Miquel Dunn

## 2016-04-18 DIAGNOSIS — S6981XA Other specified injuries of right wrist, hand and finger(s), initial encounter: Secondary | ICD-10-CM | POA: Diagnosis not present

## 2016-04-18 DIAGNOSIS — M25531 Pain in right wrist: Secondary | ICD-10-CM | POA: Diagnosis not present

## 2016-04-18 DIAGNOSIS — M25431 Effusion, right wrist: Secondary | ICD-10-CM | POA: Diagnosis not present

## 2016-04-18 DIAGNOSIS — M778 Other enthesopathies, not elsewhere classified: Secondary | ICD-10-CM | POA: Diagnosis not present

## 2016-04-24 ENCOUNTER — Telehealth: Payer: Self-pay | Admitting: *Deleted

## 2016-04-24 ENCOUNTER — Ambulatory Visit (INDEPENDENT_AMBULATORY_CARE_PROVIDER_SITE_OTHER): Payer: BLUE CROSS/BLUE SHIELD | Admitting: Family Medicine

## 2016-04-24 ENCOUNTER — Encounter: Payer: Self-pay | Admitting: Family Medicine

## 2016-04-24 VITALS — BP 108/64 | HR 80 | Temp 98.6°F | Resp 16 | Wt 197.8 lb

## 2016-04-24 DIAGNOSIS — R319 Hematuria, unspecified: Secondary | ICD-10-CM

## 2016-04-24 DIAGNOSIS — N390 Urinary tract infection, site not specified: Secondary | ICD-10-CM | POA: Diagnosis not present

## 2016-04-24 DIAGNOSIS — R300 Dysuria: Secondary | ICD-10-CM | POA: Diagnosis not present

## 2016-04-24 LAB — POCT URINALYSIS DIPSTICK
BILIRUBIN UA: NEGATIVE
GLUCOSE UA: NEGATIVE
KETONES UA: NEGATIVE
Protein, UA: NEGATIVE
Spec Grav, UA: <=1.005
Urobilinogen, UA: 0.2
pH, UA: 6

## 2016-04-24 MED ORDER — DOXYCYCLINE HYCLATE 100 MG PO TABS: 100.0000 mg | ORAL_TABLET | Freq: Two times a day (BID) | ORAL | Status: DC

## 2016-04-24 NOTE — Progress Notes (Signed)
Subjective:     Patient ID: Mitchell Mosley, male   DOB: 1961/02/08, 55 y.o.   MRN: [id number]  HPI  Chief Complaint  Patient presents with  . Dysuria    Patient comes in office today with concerns of lower back pain, burning with urination, chills and headache. Patient states that he had chemo treatment yesterday and nurse suggested that he might have a UTI. Patient also reports pressure when urination and diffuclty trying to void.   Reports he is married and monogamous. No hx of UTI or kidney stones. Has not documented a fever at home.   Review of Systems     Objective:   Physical Exam  Constitutional: He appears well-developed and well-nourished. No distress.  Genitourinary:  No CVA tenderness       Assessment:    1. Dysuria - Urine culture - POCT urinalysis dipstick  2. Urinary tract infection with hematuria, site unspecified - doxycycline (VIBRA-TABS) 100 MG tablet; Take 1 tablet (100 mg total) by mouth 2 (two) times daily.  Dispense: 14 tablet; Refill: 0    Plan:    Further f/u pending culture results.

## 2016-04-24 NOTE — Patient Instructions (Signed)
Continue increased fluid intake.

## 2016-04-24 NOTE — Telephone Encounter (Signed)
12:42pm  Received t/c from Mitchell Mosley reporting he is feeling bad - like he just had chemo - only his chemo isn't due until Friday. Reports he is having chills and is achy - specifically in his back and flank area. States he feels dehydrated and experienced some burning when he urinated yesterday, so he has been drinking a lot of water last night and today. States he felt bad yesterday, but feels worse today - so he is going to see a PA at the [facility]. Instructed Mitchell Mosley to make sure they check his urine for possible UTI and he states he will. States he does not have a thermometer at work so he is not sure whether he is running a fever. Also reports he hurt his wrist one week ago today and has been diagnosed with tendonitis. States he saw someone at [facility] and they gave him a brace for his wrist and told him not to use the wrist for awhile. States he cannot curently hit a tennis ball without causing a lot of pain. Instructed  Mitchell Mosley to call me back this afternoon and let me know what the PA says and he agreed. He is concerned whether he will receive his chemotherapy on Friday and was informed that it depends on his counts, whether he has a fever, and if he's still feeling sick. Yolande Jolly, BSN, MHA, OCN 04/24/2016 12:57 PM   Received t/c back from Mitchell Mosley, who states he saw the PA at [facility] and they confirmed that he has a UTI. States they gave him a prescription for Doxycycline and he will get it filled this afternoon and begin taking it today. Patient to call back if his condition does not improve and he has an appointment for his Alimta infusion on Friday, 04/27/16. Yolande Jolly, BSN, MHA, OCN 04/24/2016 3:22 PM

## 2016-04-25 ENCOUNTER — Other Ambulatory Visit: Payer: Self-pay | Admitting: *Deleted

## 2016-04-25 ENCOUNTER — Telehealth: Payer: Self-pay | Admitting: *Deleted

## 2016-04-25 DIAGNOSIS — C3491 Malignant neoplasm of unspecified part of right bronchus or lung: Secondary | ICD-10-CM

## 2016-04-25 NOTE — Telephone Encounter (Signed)
Received t/c from [name] this morning reporting that he experienced some hematuria after he urinated earlier. States he passed a small amount of bright red blood, and wanted to report this to me. Instructed patient that it is not uncommon to experience a small amount of blood in the urine with a UTI, but cautioned him that this should resolve within a day or two of beginning the antibiotics, and he should call back to let me know if it continues. Also instructed patient to drink plenty of water, and he states he is doing this. Reports he was only able to get one dose of the antibiotic in yesterday evening, and took it again this morning. States it has to be taken with food and that he has to sit upright for 10 minutes or so after taking the Doxycycline. [name] reports that he feels a little better today compared to yesterday. He has an appointment in clinic with Dr. Rogue Bussing for consideration of Alimta infusion in 2 days. Per his PCP visit yesterday, he was afebrile, but will re-evaluate symptoms when he sees Dr. Rogue Bussing this week. Yolande Jolly, BSN, MHA, OCN 04/25/2016 9:02 AM

## 2016-04-27 ENCOUNTER — Inpatient Hospital Stay: Payer: BLUE CROSS/BLUE SHIELD

## 2016-04-27 ENCOUNTER — Inpatient Hospital Stay (HOSPITAL_BASED_OUTPATIENT_CLINIC_OR_DEPARTMENT_OTHER): Payer: BLUE CROSS/BLUE SHIELD | Admitting: Internal Medicine

## 2016-04-27 VITALS — BP 120/81 | HR 69 | Temp 97.4°F | Resp 18 | Wt 191.1 lb

## 2016-04-27 DIAGNOSIS — C3491 Malignant neoplasm of unspecified part of right bronchus or lung: Secondary | ICD-10-CM

## 2016-04-27 DIAGNOSIS — E871 Hypo-osmolality and hyponatremia: Secondary | ICD-10-CM

## 2016-04-27 DIAGNOSIS — C3492 Malignant neoplasm of unspecified part of left bronchus or lung: Secondary | ICD-10-CM | POA: Diagnosis not present

## 2016-04-27 DIAGNOSIS — M549 Dorsalgia, unspecified: Secondary | ICD-10-CM | POA: Diagnosis not present

## 2016-04-27 DIAGNOSIS — N390 Urinary tract infection, site not specified: Secondary | ICD-10-CM

## 2016-04-27 DIAGNOSIS — Z79899 Other long term (current) drug therapy: Secondary | ICD-10-CM | POA: Diagnosis not present

## 2016-04-27 DIAGNOSIS — I38 Endocarditis, valve unspecified: Secondary | ICD-10-CM | POA: Diagnosis not present

## 2016-04-27 DIAGNOSIS — M7989 Other specified soft tissue disorders: Secondary | ICD-10-CM | POA: Diagnosis not present

## 2016-04-27 DIAGNOSIS — I712 Thoracic aortic aneurysm, without rupture: Secondary | ICD-10-CM | POA: Diagnosis not present

## 2016-04-27 DIAGNOSIS — R300 Dysuria: Secondary | ICD-10-CM

## 2016-04-27 DIAGNOSIS — E860 Dehydration: Secondary | ICD-10-CM

## 2016-04-27 DIAGNOSIS — C3490 Malignant neoplasm of unspecified part of unspecified bronchus or lung: Secondary | ICD-10-CM

## 2016-04-27 DIAGNOSIS — D696 Thrombocytopenia, unspecified: Secondary | ICD-10-CM

## 2016-04-27 DIAGNOSIS — Z006 Encounter for examination for normal comparison and control in clinical research program: Secondary | ICD-10-CM

## 2016-04-27 DIAGNOSIS — R600 Localized edema: Secondary | ICD-10-CM | POA: Diagnosis not present

## 2016-04-27 DIAGNOSIS — Z5111 Encounter for antineoplastic chemotherapy: Secondary | ICD-10-CM | POA: Diagnosis not present

## 2016-04-27 LAB — CBC WITH DIFFERENTIAL/PLATELET
BASOS ABS: 0 10*3/uL (ref 0–0.1)
Basophils Relative: 1 %
EOS PCT: 1 %
Eosinophils Absolute: 0 10*3/uL (ref 0–0.7)
HCT: 38.9 % — ABNORMAL LOW (ref 40.0–52.0)
HEMOGLOBIN: 13.4 g/dL (ref 13.0–18.0)
LYMPHS PCT: 29 %
Lymphs Abs: 0.9 10*3/uL — ABNORMAL LOW (ref 1.0–3.6)
MCH: 31.7 pg (ref 26.0–34.0)
MCHC: 34.4 g/dL (ref 32.0–36.0)
MCV: 92.1 fL (ref 80.0–100.0)
Monocytes Absolute: 0.5 10*3/uL (ref 0.2–1.0)
Monocytes Relative: 15 %
NEUTROS ABS: 1.7 10*3/uL (ref 1.4–6.5)
NEUTROS PCT: 54 %
PLATELETS: 129 10*3/uL — AB (ref 150–440)
RBC: 4.22 MIL/uL — AB (ref 4.40–5.90)
RDW: 13.7 % (ref 11.5–14.5)
WBC: 3.1 10*3/uL — AB (ref 3.8–10.6)

## 2016-04-27 LAB — CREATININE, URINE, RANDOM: CREATININE, URINE: 72 mg/dL

## 2016-04-27 LAB — COMPREHENSIVE METABOLIC PANEL
ALBUMIN: 3.8 g/dL (ref 3.5–5.0)
ALK PHOS: 41 U/L (ref 38–126)
ALT: 23 U/L (ref 17–63)
ANION GAP: 8 (ref 5–15)
AST: 34 U/L (ref 15–41)
BUN: 19 mg/dL (ref 6–20)
CO2: 25 mmol/L (ref 22–32)
Calcium: 8.7 mg/dL — ABNORMAL LOW (ref 8.9–10.3)
Chloride: 96 mmol/L — ABNORMAL LOW (ref 101–111)
Creatinine, Ser: 1.12 mg/dL (ref 0.61–1.24)
GFR calc Af Amer: >60 mL/min (ref >60)
GFR calc non Af Amer: >60 mL/min (ref >60)
GLUCOSE: 148 mg/dL — AB (ref 65–99)
POTASSIUM: 3.9 mmol/L (ref 3.5–5.1)
SODIUM: 129 mmol/L — AB (ref 135–145)
Total Bilirubin: 0.8 mg/dL (ref 0.3–1.2)
Total Protein: 6.9 g/dL (ref 6.5–8.1)

## 2016-04-27 LAB — URINE CULTURE

## 2016-04-27 LAB — MAGNESIUM: Magnesium: 1.7 mg/dL (ref 1.7–2.4)

## 2016-04-27 LAB — PROTEIN, URINE, RANDOM: Total Protein, Urine: <6 mg/dL

## 2016-04-27 MED ORDER — HEPARIN SOD (PORK) LOCK FLUSH 100 UNIT/ML IV SOLN
500.0000 [IU] | Freq: Once | INTRAVENOUS | Status: AC | PRN
  Administered 2016-04-27: 500 [IU]

## 2016-04-27 MED ORDER — SODIUM CHLORIDE 0.9 % IJ SOLN
10.0000 mL | INTRAMUSCULAR | Status: DC | PRN
Start: 2016-04-27 — End: 2016-04-27
  Administered 2016-04-27: 10 mL
  Filled 2016-04-27: qty 10

## 2016-04-27 NOTE — Progress Notes (Signed)
Mitchell Mosley OFFICE PROGRESS NOTE  Patient Care Team: [name], MD as PCP - General (Family Medicine)   SUMMARY OF ONCOLOGIC HISTORY:  # 2012- METASTATIC ADENO CA of LEFT LUNG [acinar pattern] s/p MED LN Bx [NEG- EGFR/K-ras/ALK mutation];[name]; [name]; CT FEB  2016- NED  # Thoracic Aneurysm [4cm- stable]   INTERVAL HISTORY: [name] with above history of metastatic adenocarcinoma the lung currently on maintenance Alimta since June 2012 is here for follow-up prior to his chemotherapy.   Patient noted to have burning with urination; also noted to have back pain bilaterally. Diagnosed with UTI on doxycycline; today being her third day. Symptoms significant improved. No fevers. He felt significantly dehydrated; he had not been drinking enough fluids.   Patient denies any nausea vomiting. Denies any headaches. Denies any skin rash. Denies any swelling in the legs. Patient denies any cough or shortness of breath or chest pain. Denies any headaches. Denies any skin rash.   REVIEW OF SYSTEMS:  A complete 10 point review of system is done which is negative except mentioned above/history of present illness.   PAST MEDICAL HISTORY :  Past Medical History  Diagnosis Date  . Allergy   . Mild valvular heart disease Nov. 2014    per 2 D Echocardiogram done for persistent leg edema, monitored by cardiology  . Lung cancer ([place])   . Peripheral edema     PAST SURGICAL HISTORY :   Past Surgical History  Procedure Laterality Date  . Elbow surgery    . Left anterior thoracotomy with biopsy  March 2012  . Portacath placement  March 2012  . Tonsillectomy      FAMILY HISTORY :   Family History  Problem Relation Age of Onset  . Leukemia Father   . Diabetes Mother     SOCIAL HISTORY:   Social History  Substance Use Topics  .  Smoking status: Never Smoker   . Smokeless tobacco: Never Used  . Alcohol Use: 0.0 oz/week    0 Standard drinks or equivalent per week     Comment: "social drinker"     ALLERGIES:  is allergic to penicillins.  MEDICATIONS:  Current Outpatient Prescriptions  Medication Sig Dispense Refill  . doxycycline (VIBRA-TABS) 100 MG tablet Take 1 tablet (100 mg total) by mouth 2 (two) times daily. 14 tablet 0  . folic acid (FOLVITE) 1 MG tablet Take 1 tablet (1 mg total) by mouth daily. 30 tablet 6  . furosemide (LASIX) 20 MG tablet Take 1 tablet (20 mg total) by mouth daily. One tablet daily as needed for swelling 90 tablet 1  . glucosamine-chondroitin 500-400 MG tablet Take 1 tablet by mouth daily. Reported on 02/24/2016    . meclizine (ANTIVERT) 25 MG tablet Take 1 tablet (25 mg total) by mouth 3 (three) times daily as needed for dizziness. 60 tablet 0  . Multiple Vitamin (MULTIVITAMIN) capsule Take 1 capsule by mouth daily. Reported on 02/24/2016    . Omega-3 Fatty Acids (FISH OIL) 1000 MG CAPS Take 1 capsule by mouth daily. Reported on 02/24/2016    . ondansetron (ZOFRAN-ODT) 4 MG disintegrating tablet Take 1 tablet (4 mg total) by mouth every 4 (four) hours as needed for nausea or vomiting. 20 tablet 2  . senna (SENOKOT) 8.6 MG tablet Take 1 tablet by mouth daily. Take as needed around time of chemotherapy    .  spironolactone (ALDACTONE) 25 MG tablet Take 1 tablet (25 mg total) by mouth daily. 90 tablet 1  . VIMOVO 500-20 MG TBEC     . vitamin B-12 (CYANOCOBALAMIN) 1000 MCG tablet Take 1,000 mcg by mouth daily.     No current facility-administered medications for this visit.   Facility-Administered Medications Ordered in Other Visits  Medication Dose Route Frequency Provider Last Rate Last Dose  . sodium chloride 0.9 % injection 10 mL  10 mL Intracatheter PRN [name], MD   10 mL at 05/27/15 1543  . sodium chloride 0.9 % injection 10 mL  10 mL Intracatheter PRN [name], MD   10 mL  at 06/17/15 1510  . sodium chloride 0.9 % injection 10 mL  10 mL Intravenous PRN [name], MD   10 mL at 07/08/15 1340    PHYSICAL EXAMINATION: ECOG PERFORMANCE STATUS: 0 - Asymptomatic  BP 120/81 mmHg  Pulse 69  Temp(Src) 97.4 F (36.3 C) (Tympanic)  Resp 18  Wt 191 lb 2.2 oz (86.7 kg)  Filed Weights   04/27/16 1440  Weight: 191 lb 2.2 oz (86.7 kg)    GENERAL: Well-nourished well-developed; Alert, [name]; good dentition  NECK: supple, [name], [name]; 1+ bilateral lower extremity swelling. ABDOMEN:abdomen soft, [name].   LABORATORY DATA:  I have reviewed the data as listed    Component Value Date/Time   NA 129* 04/27/2016 1415   NA 138 12/10/2014 1341   K 3.9 04/27/2016 1415   K 3.6 12/10/2014 1341   CL 96* 04/27/2016 1415   CL 102 12/10/2014 1341   CO2 25 04/27/2016 1415   CO2 30 12/10/2014 1341   GLUCOSE 148* 04/27/2016 1415   GLUCOSE 160* 12/10/2014 1341   BUN 19 04/27/2016 1415   BUN 21* 12/10/2014 1341   CREATININE 1.12 04/27/2016 1415   CREATININE 1.10 03/25/2015 1453   CALCIUM 8.7* 04/27/2016 1415   CALCIUM 8.4* 12/10/2014 1341   PROT 6.9 04/27/2016 1415   PROT 6.6 12/10/2014 1341   ALBUMIN 3.8 04/27/2016 1415   ALBUMIN 3.4 12/10/2014 1341   AST 34 04/27/2016 1415   AST 17 12/10/2014 1341   ALT 23 04/27/2016 1415   ALT 23 12/10/2014 1341   ALKPHOS 41 04/27/2016 1415   ALKPHOS 50 12/10/2014 1341   BILITOT 0.8 04/27/2016 1415   BILITOT 0.5 12/10/2014 1341    GFRNONAA >60 04/27/2016 1415   GFRNONAA >60 03/25/2015 1453   GFRNONAA >60 12/31/2014 1346   GFRAA >60 04/27/2016 1415   GFRAA >60 03/25/2015 1453   GFRAA >60 12/31/2014 1346    No results found for: SPEP, UPEP  Lab Results  Component Value Date   WBC 3.1* 04/27/2016   NEUTROABS 1.7 04/27/2016   HGB 13.4 04/27/2016   HCT 38.9* 04/27/2016   MCV 92.1 04/27/2016   PLT 129* 04/27/2016      Chemistry      Component Value Date/Time   NA 129* 04/27/2016 1415   NA 138 12/10/2014 1341   K 3.9 04/27/2016 1415   K  3.6 12/10/2014 1341   CL 96* 04/27/2016 1415   CL 102 12/10/2014 1341   CO2 25 04/27/2016 1415   CO2 30 12/10/2014 1341   BUN 19 04/27/2016 1415   BUN 21* 12/10/2014 1341   CREATININE 1.12 04/27/2016 1415   CREATININE 1.10 03/25/2015 1453      Component Value Date/Time   CALCIUM 8.7* 04/27/2016 1415   CALCIUM 8.4* 12/10/2014 1341   ALKPHOS 41 04/27/2016 1415   ALKPHOS 50 12/10/2014 1341   AST 34 04/27/2016 1415   AST 17 12/10/2014 1341   ALT 23 04/27/2016 1415   ALT 23 12/10/2014 1341   BILITOT 0.8 04/27/2016 1415   BILITOT 0.5 12/10/2014 1341       ASSESSMENT & PLAN:   # Metastatic adenocarcinoma the lung- currently on maintenance Alimta/on protocol; June 2012. Clinically no recurrence. HOLD CHEMO today- for UTI [see discussion below]. I discussed the rationale for holding chemotherapy.  # Thrombocytopenia/platelets 129/white count 3.1- absolute neutrophil count 1.7. Likely from chemotherapy.   # UTI-question pyelonephritis based on symptoms. Escherichia coli; sensitive tetracyclines. Patient is on doxycycline. Improving.  # Hyponatremia-Sodium 129. Likely combination of recent UTI related dehydration; continued Lasix. Recommend holding Lasix today. Continued by mouth fluids.  # Follow up in approximately 3 weeks/ labs M.D. Visit/ discussed with clinical trials RN; patient has scans scheduled prior to the next visit.      [name],  MD 04/27/2016 2:57 PM

## 2016-04-27 NOTE — Progress Notes (Signed)
[name] returns to clinic today for consideration of cycle #586 Alimta on the [employer] research study. He reports a UTI that presented with symptoms on 04/24/2016; he saw PCP at [facility] the same day and was prescribed antibiotics for the UTI. On 04/25/2016 he reported via t/c that he had experienced some hematuria after he urinated. He denies any hematuria today but continues to have some chills in the evening hours and sometimes feels a tingling sensation in his back. He is afebrile today, temp. 97.4. He also reports that he experienced some tendonitis of the right wrist and was seen on 04/17/2016 at [facility]. He is currently wearing a wrist brace most of the time.  Dr. Rogue Bussing in to examine the patient and  reviewed labs; white cell count low today (3.1), sodium low (129), and low calcuim (8.7). Dr. Rogue Bussing recommended hold off on Lasix for next few weeks and resume when urinary infection has resolved. The patient was agreeable to this plan. VS and weight stable today.  Dr. Rogue Bussing recommended no treatment today due to UTI and current symptoms; patient was also in agreement with this plan.  He will continue his antibiotics and will return to clinic in 3 weeks to see MD, get labs and consider Alimta infusion.  Mirian Mo, RN, BSN 04/27/2016 3:31 PM       Adverse Event Log  Study/Protocol: [name] S130 Cycle: #586     *No treatment received today  Event Grade Onset Date Resolved Date Drug Name Attribution Treatment Comments  Edema (mild) Grade 1 05/30/2012 ongoing Alimta Possible None   Abrasion to left knee Grade 1 03/31/16 resolving  unrelated none Fell playing tennis  Hyponatremia Grade 3 04/27/2016 ongoing  Unrelated Held Lasix Encouraged fluids  UTI Grade 2 04/24/2016 ongoing  Unrelated Antibiotics-Doxycycline Pt. States symptoms improving  Thrombocytopenia Grade 1 04/27/2016 ongoing Alimta Likely None   Fatigue Grade 1  02/03/2016 ongoing Alimta Possible None   Lymphocytopenia Grade 1 04/27/2016 ongoing  Unrelated None   Anemia Grade 1 04/27/2016 ongoing  Unrelated None   Hypocalcemia Grade 1 04/27/2016 ongoing  Unrelated None   Tendonitis Grade 2 04/17/2016 ongoing  Unrelated Wrist brace   Hematuria Grade 1 04/25/2016 04/27/2016  Unrelated Antibiotics-Doxycycline   Mirian Mo, RN, BSN 04/27/2016 3:31 PM

## 2016-04-27 NOTE — Progress Notes (Signed)
Patient currently on antibiotics for bladder infection.  Also has sports injury in right hand.

## 2016-05-01 ENCOUNTER — Telehealth: Payer: Self-pay

## 2016-05-01 NOTE — Telephone Encounter (Signed)
[id number] 

## 2016-05-01 NOTE — Telephone Encounter (Signed)
-----   Message from Carmon Ginsberg, [place] sent at 04/27/2016  1:54 PM EDT ----- Continue doxycycline for an E.Coli UTI

## 2016-05-08 ENCOUNTER — Telehealth: Payer: Self-pay | Admitting: *Deleted

## 2016-05-08 NOTE — Telephone Encounter (Signed)
05/07/2016 '@16'$ :26pm  Received t/c from patient Mitchell Mosley reporting that he has stopped taking Lasix per MD instructions as of 04/27/2016, but they he has continued to take Spironolactone. Patient wondering whether this is correct. Inst that I will consult with Dr. Rogue Bussing to make sure what he is supposed to do. States he is feeling well, and denies that he has experienced any increased swelling in his ble since stopping the Lasix. Inst to call us if this occurs. Yolande Jolly, BSN, MHA, OCN 05/07/2016 4:35 PM  Spoke with Dr. Rogue Bussing late yesterday and confirmed that patient is supposed to continue taking Spironolactone, and Dr. Rogue Bussing confirmed this. T/C back to patient to inform that Dr. Rogue Bussing does want him to continue taking the Spironolactone and hold the Lasix. Patient states that's what he thought, but wanted to make sure. Yolande Jolly, BSN, MHA, OCN 05/08/2016 9:46 AM

## 2016-05-08 NOTE — Telephone Encounter (Signed)
[id number] 

## 2016-05-09 NOTE — Telephone Encounter (Signed)
Pt is returning call.  CH#[phone]/PS

## 2016-05-09 NOTE — Telephone Encounter (Signed)
Patient was advised he states he completed antibiotic and symptoms are now gone. KW

## 2016-05-14 ENCOUNTER — Other Ambulatory Visit: Payer: Self-pay | Admitting: Family Medicine

## 2016-05-14 ENCOUNTER — Ambulatory Visit
Admission: RE | Admit: 2016-05-14 | Discharge: 2016-05-14 | Disposition: A | Discharge status: Home / Self Care | Payer: BLUE CROSS/BLUE SHIELD | Source: Ambulatory Visit | Attending: Internal Medicine | Admitting: Internal Medicine

## 2016-05-14 DIAGNOSIS — I712 Thoracic aortic aneurysm, without rupture: Secondary | ICD-10-CM | POA: Insufficient documentation

## 2016-05-14 DIAGNOSIS — C3491 Malignant neoplasm of unspecified part of right bronchus or lung: Secondary | ICD-10-CM | POA: Insufficient documentation

## 2016-05-14 MED ORDER — IOPAMIDOL (ISOVUE-300) INJECTION 61%
100.0000 mL | Freq: Once | INTRAVENOUS | Status: AC | PRN
  Administered 2016-05-14: 100 mL via INTRAVENOUS

## 2016-05-18 ENCOUNTER — Inpatient Hospital Stay: Payer: BLUE CROSS/BLUE SHIELD

## 2016-05-18 ENCOUNTER — Inpatient Hospital Stay: Payer: BLUE CROSS/BLUE SHIELD | Attending: Internal Medicine

## 2016-05-18 ENCOUNTER — Telehealth: Payer: Self-pay | Admitting: Pharmacist

## 2016-05-18 ENCOUNTER — Encounter: Payer: Self-pay | Admitting: *Deleted

## 2016-05-18 ENCOUNTER — Inpatient Hospital Stay (HOSPITAL_BASED_OUTPATIENT_CLINIC_OR_DEPARTMENT_OTHER): Payer: BLUE CROSS/BLUE SHIELD | Admitting: Internal Medicine

## 2016-05-18 VITALS — BP 104/68 | HR 68 | Temp 96.6°F | Resp 18 | Wt 194.7 lb

## 2016-05-18 DIAGNOSIS — Z006 Encounter for examination for normal comparison and control in clinical research program: Secondary | ICD-10-CM

## 2016-05-18 DIAGNOSIS — C33 Malignant neoplasm of trachea: Secondary | ICD-10-CM

## 2016-05-18 DIAGNOSIS — D696 Thrombocytopenia, unspecified: Secondary | ICD-10-CM | POA: Diagnosis not present

## 2016-05-18 DIAGNOSIS — E871 Hypo-osmolality and hyponatremia: Secondary | ICD-10-CM | POA: Diagnosis not present

## 2016-05-18 DIAGNOSIS — Z85118 Personal history of other malignant neoplasm of bronchus and lung: Secondary | ICD-10-CM | POA: Insufficient documentation

## 2016-05-18 DIAGNOSIS — C3491 Malignant neoplasm of unspecified part of right bronchus or lung: Secondary | ICD-10-CM

## 2016-05-18 DIAGNOSIS — I712 Thoracic aortic aneurysm, without rupture: Secondary | ICD-10-CM | POA: Insufficient documentation

## 2016-05-18 DIAGNOSIS — R609 Edema, unspecified: Secondary | ICD-10-CM

## 2016-05-18 DIAGNOSIS — C7802 Secondary malignant neoplasm of left lung: Secondary | ICD-10-CM | POA: Diagnosis not present

## 2016-05-18 DIAGNOSIS — R600 Localized edema: Secondary | ICD-10-CM | POA: Insufficient documentation

## 2016-05-18 DIAGNOSIS — I38 Endocarditis, valve unspecified: Secondary | ICD-10-CM | POA: Insufficient documentation

## 2016-05-18 DIAGNOSIS — C3490 Malignant neoplasm of unspecified part of unspecified bronchus or lung: Secondary | ICD-10-CM

## 2016-05-18 DIAGNOSIS — Z5111 Encounter for antineoplastic chemotherapy: Secondary | ICD-10-CM

## 2016-05-18 DIAGNOSIS — Z880 Allergy status to penicillin: Secondary | ICD-10-CM

## 2016-05-18 DIAGNOSIS — C3480 Malignant neoplasm of overlapping sites of unspecified bronchus and lung: Secondary | ICD-10-CM

## 2016-05-18 DIAGNOSIS — R2243 Localized swelling, mass and lump, lower limb, bilateral: Secondary | ICD-10-CM

## 2016-05-18 DIAGNOSIS — Z79899 Other long term (current) drug therapy: Secondary | ICD-10-CM | POA: Diagnosis not present

## 2016-05-18 LAB — CBC WITH DIFFERENTIAL/PLATELET
BASOS PCT: 1 %
Basophils Absolute: 0 10*3/uL (ref 0–0.1)
Eosinophils Absolute: 0.2 10*3/uL (ref 0–0.7)
Eosinophils Relative: 5 %
HEMATOCRIT: 37.7 % — AB (ref 40.0–52.0)
HEMOGLOBIN: 12.7 g/dL — AB (ref 13.0–18.0)
LYMPHS ABS: 1.4 10*3/uL (ref 1.0–3.6)
Lymphocytes Relative: 30 %
MCH: 31.1 pg (ref 26.0–34.0)
MCHC: 33.6 g/dL (ref 32.0–36.0)
MCV: 92.6 fL (ref 80.0–100.0)
MONO ABS: 0.5 10*3/uL (ref 0.2–1.0)
MONOS PCT: 11 %
NEUTROS ABS: 2.6 10*3/uL (ref 1.4–6.5)
Neutrophils Relative %: 53 %
Platelets: 120 10*3/uL — ABNORMAL LOW (ref 150–440)
RBC: 4.07 MIL/uL — ABNORMAL LOW (ref 4.40–5.90)
RDW: 14.1 % (ref 11.5–14.5)
WBC: 4.8 10*3/uL (ref 3.8–10.6)

## 2016-05-18 LAB — COMPREHENSIVE METABOLIC PANEL
ALBUMIN: 4.1 g/dL (ref 3.5–5.0)
ALK PHOS: 34 U/L — AB (ref 38–126)
ALT: 21 U/L (ref 17–63)
ANION GAP: 9 (ref 5–15)
AST: 29 U/L (ref 15–41)
BILIRUBIN TOTAL: 0.8 mg/dL (ref 0.3–1.2)
BUN: 29 mg/dL — AB (ref 6–20)
CALCIUM: 9.1 mg/dL (ref 8.9–10.3)
CO2: 25 mmol/L (ref 22–32)
CREATININE: 1.14 mg/dL (ref 0.61–1.24)
Chloride: 98 mmol/L — ABNORMAL LOW (ref 101–111)
Glucose, Bld: 104 mg/dL — ABNORMAL HIGH (ref 65–99)
POTASSIUM: 4.1 mmol/L (ref 3.5–5.1)
Sodium: 132 mmol/L — ABNORMAL LOW (ref 135–145)
TOTAL PROTEIN: 6.6 g/dL (ref 6.5–8.1)

## 2016-05-18 MED ORDER — SODIUM CHLORIDE 0.9 % IV SOLN
500.0000 mg/m2 | Freq: Once | INTRAVENOUS | Status: AC
  Administered 2016-05-18: 1050 mg via INTRAVENOUS
  Filled 2016-05-18: qty 42

## 2016-05-18 MED ORDER — SODIUM CHLORIDE 0.9 % IV SOLN
Freq: Once | INTRAVENOUS | Status: AC
  Administered 2016-05-18: 15:00:00 via INTRAVENOUS
  Filled 2016-05-18: qty 1000

## 2016-05-18 MED ORDER — SODIUM CHLORIDE 0.9 % IV SOLN
Freq: Once | INTRAVENOUS | Status: AC
  Administered 2016-05-18: 15:00:00 via INTRAVENOUS
  Filled 2016-05-18: qty 4

## 2016-05-18 MED ORDER — HEPARIN SOD (PORK) LOCK FLUSH 100 UNIT/ML IV SOLN
INTRAVENOUS | Status: AC
  Filled 2016-05-18: qty 5

## 2016-05-18 MED ORDER — HEPARIN SOD (PORK) LOCK FLUSH 100 UNIT/ML IV SOLN
500.0000 [IU] | Freq: Once | INTRAVENOUS | Status: AC | PRN
  Administered 2016-05-18: 500 [IU]

## 2016-05-18 NOTE — Progress Notes (Signed)
[facility] OFFICE PROGRESS NOTE  Patient Care Team: [name], [name], stage 4 [facility])   Staging form: Lung, AJCC 7th Edition     Clinical stage from 05/24/2015: Stage IV ([place], NX, [name], MD on 05/24/2015    Oncology History   # 2012- METASTATIC ADENO CA of LEFT LUNG [acinar pattern] s/p MED LN Bx [NEG- EGFR/K-ras/ALK mutation];[name]; [name]; CT FEB 2016- NED  # Thoracic Aneurysm [4cm- stable]     Malignant neoplasm of trachea, bronchus, and lung ([place])   05/18/2016 Initial Diagnosis Malignant neoplasm of trachea, bronchus, and lung ([place])     INTERVAL HISTORY:  [name] with above history of metastatic adenocarcinoma the lung currently on maintenance Alimta since June 2012 is here for follow-up prior to his chemotherapy.   Patient's chemotherapy was held last cycle because of UTI/pyelonephritis. Patient's symptoms have significantly improved.  His appetite is good. No weight loss. No chest pain or shortness of breath or cough. No headaches. He continues to be very active/playing tennis 2 hours today.  Notes to have mildly increased swelling of the bilateral lower extremities.   REVIEW OF SYSTEMS:  A complete 10 point review of system is done which is negative except mentioned above/history of present illness.   PAST MEDICAL HISTORY :  Past Medical History  Diagnosis Date  . Allergy   . Mild valvular heart disease Nov. 2014    per 2 D Echocardiogram done for persistent leg edema, monitored by cardiology  . Peripheral edema   . Lung cancer ([place])     PAST SURGICAL HISTORY :   Past Surgical History  Procedure Laterality Date  . Elbow surgery    . Left anterior thoracotomy with biopsy  March 2012  . Portacath placement   March 2012  . Tonsillectomy      FAMILY HISTORY :   Family History  Problem Relation Age of Onset  . Leukemia Father   . Diabetes Mother     SOCIAL HISTORY:   Social History  Substance Use Topics  . Smoking status: Never Smoker   . Smokeless tobacco: Never Used  . Alcohol Use: 0.0 oz/week    0 Standard drinks or equivalent per week     Comment: "social drinker"     ALLERGIES:  is allergic to penicillins.  MEDICATIONS:  Current Outpatient Prescriptions  Medication Sig Dispense Refill  . CIALIS 20 MG tablet TAKE 1/2 TO 1 TABLET BY MOUTH EVERY 2 DAYS AS NEEDED 10 tablet 12  . doxycycline (VIBRA-TABS) 100 MG tablet Take 1 tablet (100 mg total) by mouth 2 (two) times daily. 14 tablet 0  . folic acid (FOLVITE) 1 MG tablet Take 1 tablet (1 mg total) by mouth daily. 30 tablet 6  . furosemide (LASIX) 20 MG tablet Take 1 tablet (20 mg total) by mouth daily. One tablet daily as needed for swelling 90 tablet 1  . glucosamine-chondroitin 500-400 MG tablet Take 1 tablet by mouth daily. Reported on 02/24/2016    . meclizine (ANTIVERT) 25 MG tablet Take 1 tablet (25 mg total) by mouth 3 (three) times daily as needed for dizziness. 60 tablet 0  . Multiple Vitamin (MULTIVITAMIN) capsule Take 1 capsule by mouth daily. Reported on 02/24/2016    . Omega-3 Fatty Acids (FISH OIL) 1000  MG CAPS Take 1 capsule by mouth daily. Reported on 02/24/2016    . ondansetron (ZOFRAN-ODT) 4 MG disintegrating tablet Take 1 tablet (4 mg total) by mouth every 4 (four) hours as needed for nausea or vomiting. 20 tablet 2  . senna (SENOKOT) 8.6 MG tablet Take 1 tablet by mouth daily. Take as needed around time of chemotherapy    . spironolactone (ALDACTONE) 25 MG tablet Take 1 tablet (25 mg total) by mouth daily. 90 tablet 1  . VIMOVO 500-20 MG TBEC     . vitamin B-12 (CYANOCOBALAMIN) 1000 MCG tablet Take 1,000 mcg by mouth daily.     No current facility-administered medications for this visit.   Facility-Administered  Medications Ordered in Other Visits  Medication Dose Route Frequency Provider Last Rate Last Dose  . sodium chloride 0.9 % injection 10 mL  10 mL Intracatheter PRN [name], MD   10 mL at 05/27/15 1543  . sodium chloride 0.9 % injection 10 mL  10 mL Intracatheter PRN [name], MD   10 mL at 06/17/15 1510  . sodium chloride 0.9 % injection 10 mL  10 mL Intravenous PRN [name], MD   10 mL at 07/08/15 1340    PHYSICAL EXAMINATION: ECOG PERFORMANCE STATUS: 0 - Asymptomatic  BP 104/68 mmHg  Pulse 68  Temp(Src) 96.6 F (35.9 C) (Tympanic)  Resp 18  Wt 194 lb 10.7 oz (88.3 kg)  Filed Weights   05/18/16 1416  Weight: 194 lb 10.7 oz (88.3 kg)     GENERAL: Well-nourished well-developed; Alert, [name]; good dentition  NECK: supple, [name], [name]; 1+ bilateral lower extremity swelling. ABDOMEN:abdomen soft, [name];  LABORATORY DATA:  I have reviewed the data as listed    Component Value Date/Time   NA 132* 05/18/2016 1142   NA 138 12/10/2014 1341   K 4.1 05/18/2016 1142   K 3.6 12/10/2014 1341   CL 98* 05/18/2016 1142   CL 102 12/10/2014 1341   CO2 25 05/18/2016 1142   CO2 30 12/10/2014 1341   GLUCOSE 104* 05/18/2016 1142   GLUCOSE 160* 12/10/2014 1341   BUN 29* 05/18/2016 1142   BUN 21* 12/10/2014 1341   CREATININE 1.14 05/18/2016 1142   CREATININE 1.10 03/25/2015 1453   CALCIUM 9.1 05/18/2016 1142   CALCIUM 8.4* 12/10/2014 1341   PROT 6.6 05/18/2016 1142   PROT 6.6  12/10/2014 1341   ALBUMIN 4.1 05/18/2016 1142   ALBUMIN 3.4 12/10/2014 1341   AST 29 05/18/2016 1142   AST 17 12/10/2014 1341   ALT 21 05/18/2016 1142   ALT 23 12/10/2014 1341   ALKPHOS 34* 05/18/2016 1142   ALKPHOS 50 12/10/2014 1341   BILITOT 0.8 05/18/2016 1142   BILITOT 0.5 12/10/2014 1341   GFRNONAA >60 05/18/2016 1142   GFRNONAA >60 03/25/2015 1453   GFRNONAA >60 12/31/2014 1346   GFRAA >60 05/18/2016 1142   GFRAA >60 03/25/2015 1453   GFRAA >60 12/31/2014 1346    No results found for: SPEP, UPEP  Lab Results  Component Value  Date   WBC 4.8 05/18/2016   NEUTROABS 2.6 05/18/2016   HGB 12.7* 05/18/2016   HCT 37.7* 05/18/2016   MCV 92.6 05/18/2016   PLT 120* 05/18/2016      Chemistry      Component Value Date/Time   NA 132* 05/18/2016 1142   NA 138 12/10/2014 1341   K 4.1 05/18/2016 1142   K 3.6 12/10/2014 1341   CL 98* 05/18/2016 1142   CL 102 12/10/2014 1341   CO2 25 05/18/2016 1142   CO2 30 12/10/2014 1341   BUN 29* 05/18/2016 1142   BUN 21* 12/10/2014 1341   CREATININE 1.14 05/18/2016 1142   CREATININE 1.10 03/25/2015 1453      Component Value Date/Time   CALCIUM 9.1 05/18/2016 1142   CALCIUM 8.4* 12/10/2014 1341   ALKPHOS 34* 05/18/2016 1142   ALKPHOS 50 12/10/2014 1341   AST 29 05/18/2016 1142   AST 17 12/10/2014 1341   ALT 21 05/18/2016 1142   ALT 23 12/10/2014 1341   BILITOT 0.8 05/18/2016 1142   BILITOT 0.5 12/10/2014 1341       RADIOGRAPHIC STUDIES: I have personally reviewed the radiological images as listed and agreed with the findings in the report. No results found.   ASSESSMENT & PLAN:  Malignant neoplasm of trachea, bronchus, and lung ([place]) Patient currently on Alimta maintenance since 2012. Clinically no evidence of progression. CT chest abdomen pelvis NED. Reviewed the images myself. Patient given a copy of the CT scans. Reviewed with clinical trials nurse.   Encounter for antineoplastic chemotherapy Continue maintenance  Alimta-CBC CMP unremarkable- except for mild thrombocytopenia platelets 120.  Hyponatremia Sodium 132 slightly improved. Monitor for now.  Thrombocytopenia (HCC) Platelets 120; grade 1. No new recommendations; monitor for now.  Edema, peripheral Grade 1; continue Lasix when necessary.     No orders of the defined types were placed in this encounter.   All questions were answered. The patient knows to call the clinic with any problems, questions or concerns.      [name], MD 05/18/2016 6:46 PM

## 2016-05-18 NOTE — Assessment & Plan Note (Signed)
Platelets 120; grade 1. No new recommendations; monitor for now.

## 2016-05-18 NOTE — Assessment & Plan Note (Signed)
Continue maintenance Alimta-CBC CMP unremarkable- except for mild thrombocytopenia platelets 120.

## 2016-05-18 NOTE — Assessment & Plan Note (Signed)
Grade 1; continue Lasix when necessary.

## 2016-05-18 NOTE — Telephone Encounter (Signed)
Spoke with patient regarding new NSAID patient is taking (Vimovo). This medication has the potential to increase levels of Alimta and worsen side effects. Told patient to let us know if he has worsening symptoms after chemo today. Vimovo can be held prior to Alimta administration and for several days after to decrease the likely hood of increased side effects of Alimta

## 2016-05-18 NOTE — Assessment & Plan Note (Signed)
Sodium 132 slightly improved. Monitor for now.

## 2016-05-18 NOTE — Progress Notes (Signed)
Mitchell Mosley returns to clinic today for consideration of cycle #587 Alimta infusion on the [employer] research study. Patient reports he is feeling much better today than he did his last visit. He had a CT scan this week, which showed "no evidence for metastatic disease within the chest or abdomen." Dr. Rogue Bussing has reviewed these results with the patient. He continues to report some tendonitis of the right wrist which was evaluated by [facility] on 04/17/16. 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  VS and weight remain stable. Dr. Rogue Bussing in to examine the patient and noted that his platelets are still a little low at 120,000. Sodium level has improved to grade 1. Hypocalcemia and lymphocytopenia are now resolved. Patient denies  further symptoms of UTI or hematuria since he finished his antibiotics. Mitchell Mosley reports he is again experiencing some mild edema in his BLE at times and Dr. Rogue Bussing instructed him to resume taking the Lasix '20mg'$  only as needed. Will proceed with Cycle 587 of Alimta as scheduled. Patient will receive his B12 injection at his next scheduled appointment in 3 weeks. Yolande Jolly, BSN, MHA, OCN 05/18/2016 2:42 PM     Adverse Event Log  Study/Protocol: Eli Lilly S130 Cycle: #587  Event Grade Onset Date Resolved Date Drug Name Attribution Treatment Comments  Edema (mild) Grade 1 05/30/2012 ongoing Alimta Possible None   Abrasion to left knee Grade 1 03/31/16 05/18/16  unrelated none Fell playing tennis  Hyponatremia Grade 1 05/18/16   unrelated none   Hyponatremia Grade 3 04/27/16 05/18/16  Unrelated Held Lasix Encouraged fluids  UTI Grade 2 04/24/2016 05/01/16  Unrelated Antibiotics: Doxycycline Denies symptoms   Thrombocytopenia Grade 1 04/27/16 ongoing Alimta Likely None   Fatigue Grade 1 02/03/16 ongoing Alimta Possible None    Lymphocytopenia Grade 1 04/27/2016 05/19/15  Unrelated None   Anemia Grade 1 04/27/2016 ongoing  Unrelated None   Hypocalcemia Grade 1 04/27/2016 05/18/16  Unrelated None   Tendonitis Grade 2 04/17/2016 ongoing  Unrelated Wrist brace   Hematuria Grade 1 04/25/2016 04/27/2016  Unrelated Antibiotics-Doxycycline         Yolande Jolly, BSN, MHA, OCN 05/18/2016 2:42 PM

## 2016-05-18 NOTE — Assessment & Plan Note (Addendum)
Stage IV NED. Patient currently on Alimta maintenance since 2012. Clinically no evidence of progression. CT chest abdomen pelvis NED. Reviewed the images myself. Patient given a copy of the CT scans. Reviewed with clinical trials nurse.

## 2016-05-23 DIAGNOSIS — R609 Edema, unspecified: Secondary | ICD-10-CM | POA: Diagnosis not present

## 2016-06-08 ENCOUNTER — Inpatient Hospital Stay: Payer: BLUE CROSS/BLUE SHIELD | Attending: Internal Medicine

## 2016-06-08 ENCOUNTER — Encounter: Payer: Self-pay | Admitting: *Deleted

## 2016-06-08 ENCOUNTER — Inpatient Hospital Stay: Payer: BLUE CROSS/BLUE SHIELD

## 2016-06-08 ENCOUNTER — Inpatient Hospital Stay (HOSPITAL_BASED_OUTPATIENT_CLINIC_OR_DEPARTMENT_OTHER): Payer: BLUE CROSS/BLUE SHIELD | Admitting: Internal Medicine

## 2016-06-08 VITALS — BP 121/71 | HR 75 | Temp 97.4°F | Resp 18 | Wt 194.7 lb

## 2016-06-08 DIAGNOSIS — Z79899 Other long term (current) drug therapy: Secondary | ICD-10-CM

## 2016-06-08 DIAGNOSIS — C3492 Malignant neoplasm of unspecified part of left bronchus or lung: Secondary | ICD-10-CM | POA: Diagnosis not present

## 2016-06-08 DIAGNOSIS — I712 Thoracic aortic aneurysm, without rupture: Secondary | ICD-10-CM

## 2016-06-08 DIAGNOSIS — Z006 Encounter for examination for normal comparison and control in clinical research program: Secondary | ICD-10-CM

## 2016-06-08 DIAGNOSIS — C3480 Malignant neoplasm of overlapping sites of unspecified bronchus and lung: Principal | ICD-10-CM

## 2016-06-08 DIAGNOSIS — Z5111 Encounter for antineoplastic chemotherapy: Secondary | ICD-10-CM | POA: Diagnosis not present

## 2016-06-08 DIAGNOSIS — Z791 Long term (current) use of non-steroidal anti-inflammatories (NSAID): Secondary | ICD-10-CM | POA: Diagnosis not present

## 2016-06-08 DIAGNOSIS — Z880 Allergy status to penicillin: Secondary | ICD-10-CM

## 2016-06-08 DIAGNOSIS — C3491 Malignant neoplasm of unspecified part of right bronchus or lung: Secondary | ICD-10-CM

## 2016-06-08 DIAGNOSIS — C33 Malignant neoplasm of trachea: Secondary | ICD-10-CM

## 2016-06-08 DIAGNOSIS — I38 Endocarditis, valve unspecified: Secondary | ICD-10-CM

## 2016-06-08 DIAGNOSIS — C3490 Malignant neoplasm of unspecified part of unspecified bronchus or lung: Secondary | ICD-10-CM

## 2016-06-08 DIAGNOSIS — R600 Localized edema: Secondary | ICD-10-CM

## 2016-06-08 DIAGNOSIS — D696 Thrombocytopenia, unspecified: Secondary | ICD-10-CM | POA: Insufficient documentation

## 2016-06-08 LAB — COMPREHENSIVE METABOLIC PANEL
ALT: 17 U/L (ref 17–63)
AST: 23 U/L (ref 15–41)
Albumin: 4.3 g/dL (ref 3.5–5.0)
Alkaline Phosphatase: 43 U/L (ref 38–126)
Anion gap: 7 (ref 5–15)
BILIRUBIN TOTAL: 0.9 mg/dL (ref 0.3–1.2)
BUN: 21 mg/dL — AB (ref 6–20)
CHLORIDE: 100 mmol/L — AB (ref 101–111)
CO2: 28 mmol/L (ref 22–32)
CREATININE: 1.01 mg/dL (ref 0.61–1.24)
Calcium: 9.2 mg/dL (ref 8.9–10.3)
Glucose, Bld: 116 mg/dL — ABNORMAL HIGH (ref 65–99)
POTASSIUM: 3.9 mmol/L (ref 3.5–5.1)
Sodium: 135 mmol/L (ref 135–145)
TOTAL PROTEIN: 7.1 g/dL (ref 6.5–8.1)

## 2016-06-08 LAB — CBC WITH DIFFERENTIAL/PLATELET
Basophils Absolute: 0 10*3/uL (ref 0–0.1)
Basophils Relative: 1 %
EOS PCT: 2 %
Eosinophils Absolute: 0.1 10*3/uL (ref 0–0.7)
HEMATOCRIT: 38.5 % — AB (ref 40.0–52.0)
Hemoglobin: 13.3 g/dL (ref 13.0–18.0)
LYMPHS ABS: 1.4 10*3/uL (ref 1.0–3.6)
LYMPHS PCT: 32 %
MCH: 32.2 pg (ref 26.0–34.0)
MCHC: 34.6 g/dL (ref 32.0–36.0)
MCV: 93.1 fL (ref 80.0–100.0)
MONO ABS: 0.5 10*3/uL (ref 0.2–1.0)
MONOS PCT: 11 %
NEUTROS ABS: 2.5 10*3/uL (ref 1.4–6.5)
Neutrophils Relative %: 54 %
PLATELETS: 160 10*3/uL (ref 150–440)
RBC: 4.13 MIL/uL — ABNORMAL LOW (ref 4.40–5.90)
RDW: 14.7 % — AB (ref 11.5–14.5)
WBC: 4.6 10*3/uL (ref 3.8–10.6)

## 2016-06-08 MED ORDER — SODIUM CHLORIDE 0.9 % IJ SOLN
10.0000 mL | INTRAMUSCULAR | Status: DC | PRN
  Administered 2016-06-08: 10 mL
  Filled 2016-06-08: qty 10

## 2016-06-08 MED ORDER — SODIUM CHLORIDE 0.9 % IV SOLN
Freq: Once | INTRAVENOUS | Status: AC
  Administered 2016-06-08: 15:00:00 via INTRAVENOUS
  Filled 2016-06-08: qty 1000

## 2016-06-08 MED ORDER — HEPARIN SOD (PORK) LOCK FLUSH 100 UNIT/ML IV SOLN
500.0000 [IU] | Freq: Once | INTRAVENOUS | Status: AC | PRN
  Administered 2016-06-08: 500 [IU]

## 2016-06-08 MED ORDER — CYANOCOBALAMIN 1000 MCG/ML IJ SOLN
1000.0000 ug | Freq: Once | INTRAMUSCULAR | Status: AC
  Administered 2016-06-08: 1000 ug via INTRAMUSCULAR
  Filled 2016-06-08: qty 1

## 2016-06-08 MED ORDER — SODIUM CHLORIDE 0.9 % IV SOLN
500.0000 mg/m2 | Freq: Once | INTRAVENOUS | Status: AC
  Administered 2016-06-08: 1050 mg via INTRAVENOUS
  Filled 2016-06-08: qty 42

## 2016-06-08 MED ORDER — SODIUM CHLORIDE 0.9 % IV SOLN
Freq: Once | INTRAVENOUS | Status: AC
  Administered 2016-06-08: 15:00:00 via INTRAVENOUS
  Filled 2016-06-08: qty 4

## 2016-06-08 NOTE — Progress Notes (Signed)
[facility] OFFICE PROGRESS NOTE  Patient Care Team: [name], [name], stage 4 [facility])   Staging form: Lung, AJCC 7th Edition     Clinical stage from 05/24/2015: Stage IV ([place], NX, [name], MD on 05/24/2015    Oncology History   # 2012- METASTATIC ADENO CA of LEFT LUNG [acinar pattern] s/p MED LN Bx [NEG- EGFR/K-ras/ALK mutation];[name]; [name]; CT FEB 2016- NED  # Thoracic Aneurysm [4cm- stable]     Malignant neoplasm of trachea, bronchus, and lung ([place])   05/18/2016 Initial Diagnosis Malignant neoplasm of trachea, bronchus, and lung ([place])     INTERVAL HISTORY:  [name] with above history of metastatic adenocarcinoma the lung currently on maintenance Alimta since June 2012 is here for follow-up prior to his chemotherapy.   No shortness of breath or chest pain. His appetite is good. No weight loss. NNo headaches. He continues to be very active/playing tennis 2 hours today.  Notes to have mildly increased swelling of the bilateral lower extremities. He continues to take Lasix as needed.  REVIEW OF SYSTEMS:  A complete 10 point review of system is done which is negative except mentioned above/history of present illness.   PAST MEDICAL HISTORY :  Past Medical History  Diagnosis Date  . Allergy   . Mild valvular heart disease Nov. 2014    per 2 D Echocardiogram done for persistent leg edema, monitored by cardiology  . Peripheral edema   . Lung cancer ([place])     PAST SURGICAL HISTORY :   Past Surgical History  Procedure Laterality Date  . Elbow surgery    . Left anterior thoracotomy with biopsy  March 2012  . Portacath placement  March 2012  . Tonsillectomy      FAMILY HISTORY :   Family History  Problem Relation Age  of Onset  . Leukemia Father   . Diabetes Mother     SOCIAL HISTORY:   Social History  Substance Use Topics  . Smoking status: Never Smoker   . Smokeless tobacco: Never Used  . Alcohol Use: 0.0 oz/week    0 Standard drinks or equivalent per week     Comment: "social drinker"     ALLERGIES:  is allergic to penicillins.  MEDICATIONS:  Current Outpatient Prescriptions  Medication Sig Dispense Refill  . CIALIS 20 MG tablet TAKE 1/2 TO 1 TABLET BY MOUTH EVERY 2 DAYS AS NEEDED 10 tablet 12  . doxycycline (VIBRA-TABS) 100 MG tablet Take 1 tablet (100 mg total) by mouth 2 (two) times daily. 14 tablet 0  . folic acid (FOLVITE) 1 MG tablet Take 1 tablet (1 mg total) by mouth daily. 30 tablet 6  . furosemide (LASIX) 20 MG tablet Take 1 tablet (20 mg total) by mouth daily. One tablet daily as needed for swelling 90 tablet 1  . glucosamine-chondroitin 500-400 MG tablet Take 1 tablet by mouth daily. Reported on 02/24/2016    . meclizine (ANTIVERT) 25 MG tablet Take 1 tablet (25 mg total) by mouth 3 (three) times daily as needed for dizziness. 60 tablet 0  . Multiple Vitamin (MULTIVITAMIN) capsule Take 1 capsule by mouth daily. Reported on 02/24/2016    . Omega-3 Fatty Acids (FISH OIL) 1000 MG CAPS Take 1 capsule by mouth daily. Reported on 02/24/2016    .  ondansetron (ZOFRAN-ODT) 4 MG disintegrating tablet Take 1 tablet (4 mg total) by mouth every 4 (four) hours as needed for nausea or vomiting. 20 tablet 2  . senna (SENOKOT) 8.6 MG tablet Take 1 tablet by mouth daily. Take as needed around time of chemotherapy    . spironolactone (ALDACTONE) 25 MG tablet Take 1 tablet (25 mg total) by mouth daily. 90 tablet 1  . VIMOVO 500-20 MG TBEC     . vitamin B-12 (CYANOCOBALAMIN) 1000 MCG tablet Take 1,000 mcg by mouth daily.    . Naproxen-Esomeprazole 375-20 MG TBEC Take by mouth.     No current facility-administered medications for this visit.   Facility-Administered Medications Ordered in Other Visits   Medication Dose Route Frequency Provider Last Rate Last Dose  . sodium chloride 0.9 % injection 10 mL  10 mL Intracatheter PRN [name], MD   10 mL at 05/27/15 1543  . sodium chloride 0.9 % injection 10 mL  10 mL Intracatheter PRN [name], MD   10 mL at 06/17/15 1510  . sodium chloride 0.9 % injection 10 mL  10 mL Intravenous PRN [name], MD   10 mL at 07/08/15 1340    PHYSICAL EXAMINATION: ECOG PERFORMANCE STATUS: 0 - Asymptomatic  BP 121/71 mmHg  Pulse 75  Temp(Src) 97.4 F (36.3 C) (Tympanic)  Resp 18  Wt 194 lb 10.7 oz (88.3 kg)  SpO2   Filed Weights   06/08/16 1403  Weight: 194 lb 10.7 oz (88.3 kg)     GENERAL: Well-nourished well-developed; Alert, [name]; good dentition  NECK: supple, [name], [name]; 1+ bilateral lower extremity swelling. ABDOMEN:abdomen soft, [name];  LABORATORY DATA:  I have reviewed the data as listed    Component Value Date/Time   NA 135 06/08/2016 1119   NA 138 12/10/2014 1341   K 3.9 06/08/2016 1119   K 3.6 12/10/2014 1341   CL 100* 06/08/2016 1119   CL 102 12/10/2014 1341   CO2 28 06/08/2016 1119   CO2 30 12/10/2014 1341   GLUCOSE 116* 06/08/2016 1119   GLUCOSE 160* 12/10/2014 1341   BUN 21* 06/08/2016 1119   BUN 21* 12/10/2014 1341   CREATININE 1.01 06/08/2016 1119   CREATININE 1.10 03/25/2015 1453   CALCIUM 9.2 06/08/2016 1119   CALCIUM 8.4* 12/10/2014 1341   PROT 7.1 06/08/2016 1119   PROT 6.6 12/10/2014 1341   ALBUMIN 4.3  06/08/2016 1119   ALBUMIN 3.4 12/10/2014 1341   AST 23 06/08/2016 1119   AST 17 12/10/2014 1341   ALT 17 06/08/2016 1119   ALT 23 12/10/2014 1341   ALKPHOS 43 06/08/2016 1119   ALKPHOS 50 12/10/2014 1341   BILITOT 0.9 06/08/2016 1119   BILITOT 0.5 12/10/2014 1341   GFRNONAA >60 06/08/2016 1119   GFRNONAA >60 03/25/2015 1453   GFRNONAA >60 12/31/2014 1346   GFRAA >60 06/08/2016 1119   GFRAA >60 03/25/2015 1453   GFRAA >60 12/31/2014 1346    No results found for: SPEP, UPEP  Lab Results  Component Value Date  WBC 4.6 06/08/2016   NEUTROABS 2.5 06/08/2016   HGB 13.3 06/08/2016   HCT 38.5* 06/08/2016   MCV 93.1 06/08/2016   PLT 160 06/08/2016      Chemistry      Component Value Date/Time   NA 135 06/08/2016 1119   NA 138 12/10/2014 1341   K 3.9 06/08/2016 1119   K 3.6 12/10/2014 1341   CL 100* 06/08/2016 1119   CL 102 12/10/2014 1341   CO2 28 06/08/2016 1119   CO2 30 12/10/2014 1341   BUN 21* 06/08/2016 1119   BUN 21* 12/10/2014 1341   CREATININE 1.01 06/08/2016 1119   CREATININE 1.10 03/25/2015 1453      Component Value Date/Time   CALCIUM 9.2 06/08/2016 1119   CALCIUM 8.4* 12/10/2014 1341   ALKPHOS 43 06/08/2016 1119   ALKPHOS 50 12/10/2014 1341   AST 23 06/08/2016 1119   AST 17 12/10/2014 1341   ALT 17 06/08/2016 1119   ALT 23 12/10/2014 1341   BILITOT 0.9 06/08/2016 1119   BILITOT 0.5 12/10/2014 1341       RADIOGRAPHIC STUDIES: I have personally reviewed the radiological images as listed and agreed with the findings in the report. No results found.   ASSESSMENT & PLAN:  Malignant neoplasm of trachea, bronchus, and lung ([place]) Stage IV NED. Patient currently on Alimta maintenance since 2012. Clinically no evidence of progression.June 12 th 2017- T chest abdomen pelvis NED.   # Continue maintenance Alimta. Tolerating well without any major side effects. On folate/ B12 injection today.   #Sodium 135- improved. Monitor for now.  # Grade 1; continue  Lasix when necessary.    No orders of the defined types were placed in this encounter.   All questions were answered. The patient knows to call the clinic with any problems, questions or concerns.      [name], MD 06/08/2016 2:37 PM

## 2016-06-08 NOTE — Assessment & Plan Note (Addendum)
Stage IV NED. Patient currently on Alimta maintenance since 2012. Clinically no evidence of progression.June 12 th 2017- T chest abdomen pelvis NED.   # Continue maintenance Alimta. Tolerating well without any major side effects. On folate/ B12 injection today.   #Sodium 135- improved. Monitor for now.  # Grade 1; continue Lasix when necessary.

## 2016-06-08 NOTE — Progress Notes (Signed)
Mitchell Mosley returns to clinic today for consideration of cycle #588 Alimta infusion on the [employer] research study. He continues to report some tendonitis of the right wrist, but states this is getting better.  VS and weight remain stable. SpO2 is 100% per pulseoximeter this afternoon. Dr. Rogue Bussing in to examine the patient and Mitchell Mosley denies any new problems. Reports he is taking Lasix more this week due to feeling a little more swelling in his BLE. Reports he spoke with [name] in the pharmacy about his new medication Vimovo, and the fact that it could make the side effects of his Alimta worse; however he states he could not tell any difference with the last infusion. He plans to play in a tennis tournament the weekend of his next infusion and requests that we delay his treatment until the following Monday. Will schedule visit #589 for Monday, July 31,2017. Will proceed with Cycle 588 of Alimta as scheduled today. Patient will also receive a B12 injection today. Yolande Jolly, BSN, MHA, OCN 06/08/2016 2:42 PM     Adverse Event Log  Study/Protocol: Eli Lilly S130 Cycle: #588 (for 587)  Event Grade Onset Date Resolved Date Drug Name Pemetrexed Attribution Treatment Comments  Edema (mild) Grade 1 05/30/2012 ongoing  Possible None   Hyponatremia Grade 1 05/18/16 06/08/16  unrelated none   Thrombocytopenia Grade 1 04/27/16 06/08/16  Likely None   Fatigue Grade 1 02/03/16 ongoing  Possible None   Anemia Grade 1 04/27/2016 ongoing  Unrelated None   Tendonitis Grade 2 04/17/2016 ongoing  Unrelated Anti-inflammatory drug Vimovo         Yolande Jolly, BSN, MHA, OCN 06/08/2016 2:15 PM

## 2016-07-01 ENCOUNTER — Other Ambulatory Visit: Payer: Self-pay | Admitting: Internal Medicine

## 2016-07-02 ENCOUNTER — Inpatient Hospital Stay (HOSPITAL_BASED_OUTPATIENT_CLINIC_OR_DEPARTMENT_OTHER): Payer: BLUE CROSS/BLUE SHIELD | Admitting: Internal Medicine

## 2016-07-02 ENCOUNTER — Encounter: Payer: Self-pay | Admitting: *Deleted

## 2016-07-02 ENCOUNTER — Inpatient Hospital Stay: Payer: BLUE CROSS/BLUE SHIELD

## 2016-07-02 VITALS — BP 108/73 | HR 70 | Temp 97.6°F | Wt 197.1 lb

## 2016-07-02 DIAGNOSIS — C33 Malignant neoplasm of trachea: Secondary | ICD-10-CM

## 2016-07-02 DIAGNOSIS — C3490 Malignant neoplasm of unspecified part of unspecified bronchus or lung: Secondary | ICD-10-CM

## 2016-07-02 DIAGNOSIS — C7802 Secondary malignant neoplasm of left lung: Secondary | ICD-10-CM | POA: Diagnosis not present

## 2016-07-02 DIAGNOSIS — C3491 Malignant neoplasm of unspecified part of right bronchus or lung: Secondary | ICD-10-CM

## 2016-07-02 DIAGNOSIS — I38 Endocarditis, valve unspecified: Secondary | ICD-10-CM | POA: Diagnosis not present

## 2016-07-02 DIAGNOSIS — Z791 Long term (current) use of non-steroidal anti-inflammatories (NSAID): Secondary | ICD-10-CM

## 2016-07-02 DIAGNOSIS — C3492 Malignant neoplasm of unspecified part of left bronchus or lung: Secondary | ICD-10-CM | POA: Diagnosis not present

## 2016-07-02 DIAGNOSIS — C3481 Malignant neoplasm of overlapping sites of right bronchus and lung: Secondary | ICD-10-CM

## 2016-07-02 DIAGNOSIS — Z006 Encounter for examination for normal comparison and control in clinical research program: Secondary | ICD-10-CM

## 2016-07-02 DIAGNOSIS — I712 Thoracic aortic aneurysm, without rupture: Secondary | ICD-10-CM | POA: Diagnosis not present

## 2016-07-02 DIAGNOSIS — Z880 Allergy status to penicillin: Secondary | ICD-10-CM | POA: Diagnosis not present

## 2016-07-02 DIAGNOSIS — Z79899 Other long term (current) drug therapy: Secondary | ICD-10-CM | POA: Diagnosis not present

## 2016-07-02 DIAGNOSIS — C3480 Malignant neoplasm of overlapping sites of unspecified bronchus and lung: Principal | ICD-10-CM

## 2016-07-02 DIAGNOSIS — D696 Thrombocytopenia, unspecified: Secondary | ICD-10-CM | POA: Diagnosis not present

## 2016-07-02 DIAGNOSIS — R600 Localized edema: Secondary | ICD-10-CM

## 2016-07-02 DIAGNOSIS — Z5111 Encounter for antineoplastic chemotherapy: Secondary | ICD-10-CM | POA: Diagnosis not present

## 2016-07-02 LAB — URINALYSIS COMPLETE WITH MICROSCOPIC (ARMC ONLY)
Bacteria, UA: NONE SEEN
Bilirubin Urine: NEGATIVE
GLUCOSE, UA: NEGATIVE mg/dL
Hgb urine dipstick: NEGATIVE
KETONES UR: NEGATIVE mg/dL
Leukocytes, UA: NEGATIVE
Nitrite: NEGATIVE
PROTEIN: NEGATIVE mg/dL
SPECIFIC GRAVITY, URINE: 1.01 (ref 1.005–1.030)
WBC UA: NONE SEEN WBC/hpf (ref 0–5)
pH: 5 (ref 5.0–8.0)

## 2016-07-02 LAB — CBC WITH DIFFERENTIAL/PLATELET
BASOS ABS: 0 10*3/uL (ref 0–0.1)
BASOS PCT: 1 %
Eosinophils Absolute: 0.1 10*3/uL (ref 0–0.7)
Eosinophils Relative: 3 %
HEMATOCRIT: 38.9 % — AB (ref 40.0–52.0)
HEMOGLOBIN: 13.6 g/dL (ref 13.0–18.0)
LYMPHS PCT: 31 %
Lymphs Abs: 1.5 10*3/uL (ref 1.0–3.6)
MCH: 32.2 pg (ref 26.0–34.0)
MCHC: 35 g/dL (ref 32.0–36.0)
MCV: 92.2 fL (ref 80.0–100.0)
MONO ABS: 0.4 10*3/uL (ref 0.2–1.0)
Monocytes Relative: 9 %
NEUTROS ABS: 2.7 10*3/uL (ref 1.4–6.5)
NEUTROS PCT: 56 %
Platelets: 143 10*3/uL — ABNORMAL LOW (ref 150–440)
RBC: 4.22 MIL/uL — AB (ref 4.40–5.90)
RDW: 14.2 % (ref 11.5–14.5)
WBC: 4.8 10*3/uL (ref 3.8–10.6)

## 2016-07-02 LAB — COMPREHENSIVE METABOLIC PANEL
ALBUMIN: 4.3 g/dL (ref 3.5–5.0)
ALT: 19 U/L (ref 17–63)
AST: 25 U/L (ref 15–41)
Alkaline Phosphatase: 47 U/L (ref 38–126)
Anion gap: 5 (ref 5–15)
BILIRUBIN TOTAL: 0.9 mg/dL (ref 0.3–1.2)
BUN: 21 mg/dL — AB (ref 6–20)
CO2: 27 mmol/L (ref 22–32)
CREATININE: 1.05 mg/dL (ref 0.61–1.24)
Calcium: 8.8 mg/dL — ABNORMAL LOW (ref 8.9–10.3)
Chloride: 103 mmol/L (ref 101–111)
GFR calc Af Amer: >60 mL/min (ref >60)
GLUCOSE: 110 mg/dL — AB (ref 65–99)
POTASSIUM: 4.1 mmol/L (ref 3.5–5.1)
Sodium: 135 mmol/L (ref 135–145)
TOTAL PROTEIN: 7.2 g/dL (ref 6.5–8.1)

## 2016-07-02 MED ORDER — SODIUM CHLORIDE 0.9 % IV SOLN
Freq: Once | INTRAVENOUS | Status: AC
  Administered 2016-07-02: 15:00:00 via INTRAVENOUS
  Filled 2016-07-02: qty 4

## 2016-07-02 MED ORDER — HEPARIN SOD (PORK) LOCK FLUSH 100 UNIT/ML IV SOLN
500.0000 [IU] | Freq: Once | INTRAVENOUS | Status: AC | PRN
  Administered 2016-07-02: 500 [IU]
  Filled 2016-07-02: qty 5

## 2016-07-02 MED ORDER — INV-PEMETREXED CHEMO INJECTION 500 MG LILLY S130
500.0000 mg/m2 | Freq: Once | INTRAVENOUS | Status: AC
  Administered 2016-07-02: 1050 mg via INTRAVENOUS
  Filled 2016-07-02: qty 42

## 2016-07-02 MED ORDER — SODIUM CHLORIDE 0.9 % IV SOLN
Freq: Once | INTRAVENOUS | Status: AC
  Administered 2016-07-02: 15:00:00 via INTRAVENOUS
  Filled 2016-07-02: qty 1000

## 2016-07-02 MED ORDER — SODIUM CHLORIDE 0.9 % IJ SOLN
10.0000 mL | INTRAMUSCULAR | Status: DC | PRN
  Administered 2016-07-02: 10 mL
  Filled 2016-07-02: qty 10

## 2016-07-02 NOTE — Progress Notes (Signed)
[name] returns to clinic today for consideration of cycle #589 Alimta infusion on the [employer] research study. Reports he is doing well, but is very tired from having just finished a tennis camp for kids this past weekend. VS and weight remain stable.  Dr. Rogue Bussing in to examine the patient and Mr. Fiero denies any new problems. Reports he is still experiencing some mild swelling in his BLE and takes Lasix as needed.  CBC and chemistries are largely wnl with just a slight drop in platelet count to 143,000 this cycle (grade 1), and continued mild anemia (grade 1). Urinalysis is negative for protein today. He continues to take medication for his tendonitis, which has improved some. Will proceed with Cycle 589 of Alimta as scheduled today.  Yolande Jolly, BSN, MHA, OCN 07/02/2016 2:42 PM     Adverse Event Log  Study/Protocol: Eli Lilly S130 Cycle: #589 (for 588)  Event Grade Onset Date Resolved Date Drug Name Pemetrexed Attribution Treatment Comments  Edema (mild) Grade 1 05/30/2012 ongoing  Possible None   Thrombocytopenia Grade 1 07/02/16   Probable None   Fatigue Grade 1 02/03/16 ongoing  Possible None   Anemia Grade 1 04/27/2016 ongoing  Unrelated None   Tendonitis Grade 2 04/17/2016 ongoing  Unrelated Anti-inflammatory drug Vimovo         Yolande Jolly, BSN, MHA, OCN 07/02/2016 2:38 PM

## 2016-07-02 NOTE — Assessment & Plan Note (Signed)
Stage IV NED. Patient currently on Alimta maintenance since 2012. Clinically no evidence of progression.June 12 th 2017- T chest abdomen pelvis NED.   # Continue maintenance Alimta. Tolerating well without any major side effects. On folate. .   # Mild thrombocytopenia platelets 140s.   # Grade 1; continue Lasix when necessary.   # Follow-up with me in 3 weeks' labs chemotherapy.

## 2016-07-02 NOTE — Progress Notes (Signed)
[place] OFFICE PROGRESS NOTE  Patient Care Team: [name], [name], stage 4 [facility])   Staging form: Lung, AJCC 7th Edition     Clinical stage from 05/24/2015: Stage IV ([place], NX, [name], MD on 05/24/2015    Oncology History   # 2012- METASTATIC ADENO CA of LEFT LUNG [acinar pattern] s/p MED LN Bx [NEG- EGFR/K-ras/ALK mutation];[name]; [name]; CT FEB 2016- NED  # Thoracic Aneurysm [4cm- stable]     Malignant neoplasm of trachea, bronchus, and lung ([place])   05/18/2016 Initial Diagnosis    Malignant neoplasm of trachea, bronchus, and lung ([place])       INTERVAL HISTORY:  [name] with above history of metastatic adenocarcinoma the lung currently on maintenance Alimta since June 2012 is here for follow-up prior to his chemotherapy.   He continues to be active is physically. No headaches. No shortness of breath or chest pain. His appetite is good. No weight loss. Notes to have mildly increased swelling of the bilateral lower extremities. He continues to take Lasix as needed.  REVIEW OF SYSTEMS:  A complete 10 point review of system is done which is negative except mentioned above/history of present illness.   PAST MEDICAL HISTORY :  Past Medical History:  Diagnosis Date  . Allergy   . Lung cancer ([place])   . Mild valvular heart disease Nov. 2014   per 2 D Echocardiogram done for persistent leg edema, monitored by cardiology  . Peripheral edema     PAST SURGICAL HISTORY :   Past Surgical History:  Procedure Laterality Date  . ELBOW SURGERY    . Left anterior thoracotomy with biopsy  March 2012  . PORTACATH PLACEMENT  March 2012  . TONSILLECTOMY      FAMILY HISTORY :   Family History  Problem Relation Age of Onset  .  Leukemia Father   . Diabetes Mother     SOCIAL HISTORY:   Social History  Substance Use Topics  . Smoking status: Never Smoker  . Smokeless tobacco: Never Used  . Alcohol use 0.0 oz/week     Comment: "social drinker"     ALLERGIES:  is allergic to penicillins.  MEDICATIONS:  Current Outpatient Prescriptions  Medication Sig Dispense Refill  . CIALIS 20 MG tablet TAKE 1/2 TO 1 TABLET BY MOUTH EVERY 2 DAYS AS NEEDED 10 tablet 12  . doxycycline (VIBRA-TABS) 100 MG tablet Take 1 tablet (100 mg total) by mouth 2 (two) times daily. 14 tablet 0  . folic acid (FOLVITE) 1 MG tablet Take 1 tablet (1 mg total) by mouth daily. 30 tablet 6  . furosemide (LASIX) 20 MG tablet Take 1 tablet (20 mg total) by mouth daily. One tablet daily as needed for swelling 90 tablet 1  . glucosamine-chondroitin 500-400 MG tablet Take 1 tablet by mouth daily. Reported on 02/24/2016    . meclizine (ANTIVERT) 25 MG tablet Take 1 tablet (25 mg total) by mouth 3 (three) times daily as needed for dizziness. 60 tablet 0  . Multiple Vitamin (MULTIVITAMIN) capsule Take 1 capsule by mouth daily. Reported on 02/24/2016    . Naproxen-Esomeprazole 375-20 MG TBEC Take by mouth.    . Omega-3 Fatty Acids (FISH OIL) 1000 MG CAPS Take 1 capsule by mouth daily. Reported on 02/24/2016    .  ondansetron (ZOFRAN-ODT) 4 MG disintegrating tablet Take 1 tablet (4 mg total) by mouth every 4 (four) hours as needed for nausea or vomiting. 20 tablet 2  . senna (SENOKOT) 8.6 MG tablet Take 1 tablet by mouth daily. Take as needed around time of chemotherapy    . spironolactone (ALDACTONE) 25 MG tablet Take 1 tablet (25 mg total) by mouth daily. 90 tablet 1  . VIMOVO 500-20 MG TBEC     . vitamin B-12 (CYANOCOBALAMIN) 1000 MCG tablet Take 1,000 mcg by mouth daily.     No current facility-administered medications for this visit.    Facility-Administered Medications Ordered in Other Visits  Medication Dose Route Frequency Provider Last Rate Last  Dose  . sodium chloride 0.9 % injection 10 mL  10 mL Intracatheter PRN [name], MD   10 mL at 05/27/15 1543  . sodium chloride 0.9 % injection 10 mL  10 mL Intracatheter PRN [name], MD   10 mL at 06/17/15 1510  . sodium chloride 0.9 % injection 10 mL  10 mL Intravenous PRN [name], MD   10 mL at 07/08/15 1340  . sodium chloride 0.9 % injection 10 mL  10 mL Intracatheter PRN [name], MD   10 mL at 07/02/16 1500    PHYSICAL EXAMINATION: ECOG PERFORMANCE STATUS: 0 - Asymptomatic  BP 108/73 (BP Location: Left Arm, Patient Position: Sitting)   Pulse 70   Temp 97.6 F (36.4 C) (Tympanic)   Wt 197 lb 2 oz (89.4 kg)   BMI 28.28 kg/m   Filed Weights   07/02/16 1420  Weight: 197 lb 2 oz (89.4 kg)     GENERAL: Well-nourished well-developed; Alert, [name]; good dentition  NECK: supple, [name], [name]; 1+ bilateral lower extremity swelling. ABDOMEN:abdomen soft, [name];  LABORATORY DATA:  I have reviewed the data as listed    Component Value Date/Time   NA 135 07/02/2016 1135   NA 138 12/10/2014 1341   K 4.1 07/02/2016 1135   K 3.6 12/10/2014 1341   CL 103 07/02/2016 1135   CL 102 12/10/2014 1341   CO2 27 07/02/2016 1135   CO2 30 12/10/2014 1341   GLUCOSE 110 (H) 07/02/2016 1135   GLUCOSE 160 (H) 12/10/2014 1341   BUN 21 (H) 07/02/2016 1135   BUN 21 (H) 12/10/2014 1341   CREATININE 1.05 07/02/2016 1135   CREATININE 1.10 03/25/2015 1453   CALCIUM 8.8 (L)  07/02/2016 1135   CALCIUM 8.4 (L) 12/10/2014 1341   PROT 7.2 07/02/2016 1135   PROT 6.6 12/10/2014 1341   ALBUMIN 4.3 07/02/2016 1135   ALBUMIN 3.4 12/10/2014 1341   AST 25 07/02/2016 1135   AST 17 12/10/2014 1341   ALT 19 07/02/2016 1135   ALT 23 12/10/2014 1341   ALKPHOS 47 07/02/2016 1135   ALKPHOS 50 12/10/2014 1341   BILITOT 0.9 07/02/2016 1135   BILITOT 0.5 12/10/2014 1341   GFRNONAA >60 07/02/2016 1135   GFRNONAA >60 03/25/2015 1453   GFRAA >60 07/02/2016 1135   GFRAA >60  03/25/2015 1453    No results found for: SPEP, UPEP  Lab Results  Component Value Date   WBC 4.8 07/02/2016   NEUTROABS 2.7 07/02/2016   HGB 13.6 07/02/2016   HCT 38.9 (L) 07/02/2016   MCV 92.2 07/02/2016   PLT 143 (L) 07/02/2016      Chemistry      Component Value Date/Time   NA 135 07/02/2016 1135   NA 138 12/10/2014 1341   K 4.1 07/02/2016 1135   K 3.6 12/10/2014 1341   CL 103 07/02/2016 1135   CL 102 12/10/2014 1341   CO2 27 07/02/2016 1135   CO2 30 12/10/2014 1341   BUN 21 (H) 07/02/2016 1135   BUN 21 (H) 12/10/2014 1341   CREATININE 1.05 07/02/2016 1135   CREATININE 1.10 03/25/2015 1453      Component Value Date/Time   CALCIUM 8.8 (L) 07/02/2016 1135   CALCIUM 8.4 (L) 12/10/2014 1341   ALKPHOS 47 07/02/2016 1135   ALKPHOS 50 12/10/2014 1341   AST 25 07/02/2016 1135   AST 17 12/10/2014 1341   ALT 19 07/02/2016 1135   ALT 23 12/10/2014 1341   BILITOT 0.9 07/02/2016 1135   BILITOT 0.5 12/10/2014 1341       RADIOGRAPHIC STUDIES: I have personally reviewed the radiological images as listed and agreed with the findings in the report. No results found.   ASSESSMENT & PLAN:  Malignant neoplasm of trachea, bronchus, and lung ([place]) Stage IV NED. Patient currently on Alimta maintenance since 2012. Clinically no evidence of progression.June 12 th 2017- T chest abdomen pelvis NED.   # Continue maintenance Alimta. Tolerating well without any major side effects. On folate. .   #  Mild thrombocytopenia platelets 140s.   # Grade 1; continue Lasix when necessary.   # Follow-up with me in 3 weeks' labs chemotherapy.   No orders of the defined types were placed in this encounter.  All questions were answered. The patient knows to call the clinic with any problems, questions or concerns.      [name], MD 07/02/2016 5:27 PM

## 2016-07-05 ENCOUNTER — Other Ambulatory Visit: Payer: Self-pay | Admitting: *Deleted

## 2016-07-05 ENCOUNTER — Encounter: Payer: Self-pay | Admitting: *Deleted

## 2016-07-05 MED ORDER — FOLIC ACID 1 MG PO TABS: 1.0000 mg | ORAL_TABLET | Freq: Every day | ORAL | 6 refills | Status: DC

## 2016-07-05 NOTE — Progress Notes (Signed)
Incoming fax from [employer] [facility] on folic acid 1 mg. This was approved by Dr. Rogue Bussing

## 2016-07-16 ENCOUNTER — Other Ambulatory Visit: Payer: Self-pay | Admitting: *Deleted

## 2016-07-16 DIAGNOSIS — C3480 Malignant neoplasm of overlapping sites of unspecified bronchus and lung: Principal | ICD-10-CM

## 2016-07-16 DIAGNOSIS — C33 Malignant neoplasm of trachea: Secondary | ICD-10-CM

## 2016-07-20 ENCOUNTER — Other Ambulatory Visit: Payer: BLUE CROSS/BLUE SHIELD

## 2016-07-20 ENCOUNTER — Inpatient Hospital Stay: Payer: BLUE CROSS/BLUE SHIELD

## 2016-07-20 ENCOUNTER — Other Ambulatory Visit: Payer: Self-pay

## 2016-07-20 ENCOUNTER — Encounter: Payer: Self-pay | Admitting: *Deleted

## 2016-07-20 ENCOUNTER — Inpatient Hospital Stay: Payer: BLUE CROSS/BLUE SHIELD | Admitting: Internal Medicine

## 2016-07-20 ENCOUNTER — Inpatient Hospital Stay: Payer: BLUE CROSS/BLUE SHIELD | Attending: Internal Medicine | Admitting: Internal Medicine

## 2016-07-20 ENCOUNTER — Encounter (INDEPENDENT_AMBULATORY_CARE_PROVIDER_SITE_OTHER): Payer: Self-pay

## 2016-07-20 VITALS — BP 119/77 | HR 93 | Temp 98.2°F | Resp 18 | Wt 197.3 lb

## 2016-07-20 DIAGNOSIS — M65841 Other synovitis and tenosynovitis, right hand: Secondary | ICD-10-CM | POA: Diagnosis not present

## 2016-07-20 DIAGNOSIS — C33 Malignant neoplasm of trachea: Secondary | ICD-10-CM

## 2016-07-20 DIAGNOSIS — Z880 Allergy status to penicillin: Secondary | ICD-10-CM | POA: Diagnosis not present

## 2016-07-20 DIAGNOSIS — C3401 Malignant neoplasm of right main bronchus: Secondary | ICD-10-CM | POA: Insufficient documentation

## 2016-07-20 DIAGNOSIS — C3480 Malignant neoplasm of overlapping sites of unspecified bronchus and lung: Principal | ICD-10-CM

## 2016-07-20 DIAGNOSIS — C7802 Secondary malignant neoplasm of left lung: Secondary | ICD-10-CM | POA: Insufficient documentation

## 2016-07-20 DIAGNOSIS — I38 Endocarditis, valve unspecified: Secondary | ICD-10-CM | POA: Diagnosis not present

## 2016-07-20 DIAGNOSIS — I712 Thoracic aortic aneurysm, without rupture: Secondary | ICD-10-CM | POA: Insufficient documentation

## 2016-07-20 DIAGNOSIS — C3402 Malignant neoplasm of left main bronchus: Secondary | ICD-10-CM

## 2016-07-20 DIAGNOSIS — Z5111 Encounter for antineoplastic chemotherapy: Secondary | ICD-10-CM | POA: Diagnosis not present

## 2016-07-20 DIAGNOSIS — R600 Localized edema: Secondary | ICD-10-CM | POA: Insufficient documentation

## 2016-07-20 DIAGNOSIS — Z006 Encounter for examination for normal comparison and control in clinical research program: Secondary | ICD-10-CM

## 2016-07-20 DIAGNOSIS — Z79899 Other long term (current) drug therapy: Secondary | ICD-10-CM | POA: Insufficient documentation

## 2016-07-20 DIAGNOSIS — D696 Thrombocytopenia, unspecified: Secondary | ICD-10-CM | POA: Diagnosis not present

## 2016-07-20 DIAGNOSIS — C3490 Malignant neoplasm of unspecified part of unspecified bronchus or lung: Secondary | ICD-10-CM

## 2016-07-20 LAB — COMPREHENSIVE METABOLIC PANEL
ALK PHOS: 44 U/L (ref 38–126)
ALT: 16 U/L — AB (ref 17–63)
ANION GAP: 7 (ref 5–15)
AST: 24 U/L (ref 15–41)
Albumin: 3.8 g/dL (ref 3.5–5.0)
BUN: 22 mg/dL — AB (ref 6–20)
CO2: 28 mmol/L (ref 22–32)
CREATININE: 1.05 mg/dL (ref 0.61–1.24)
Calcium: 8.5 mg/dL — ABNORMAL LOW (ref 8.9–10.3)
Chloride: 97 mmol/L — ABNORMAL LOW (ref 101–111)
GLUCOSE: 104 mg/dL — AB (ref 65–99)
Potassium: 3.5 mmol/L (ref 3.5–5.1)
SODIUM: 132 mmol/L — AB (ref 135–145)
Total Bilirubin: 0.7 mg/dL (ref 0.3–1.2)
Total Protein: 6.9 g/dL (ref 6.5–8.1)

## 2016-07-20 LAB — CBC WITH DIFFERENTIAL/PLATELET
Basophils Absolute: 0 10*3/uL (ref 0–0.1)
Basophils Relative: 0 %
EOS ABS: 0.1 10*3/uL (ref 0–0.7)
EOS PCT: 2 %
HCT: 37.1 % — ABNORMAL LOW (ref 40.0–52.0)
HEMOGLOBIN: 13 g/dL (ref 13.0–18.0)
LYMPHS ABS: 1.5 10*3/uL (ref 1.0–3.6)
LYMPHS PCT: 30 %
MCH: 32.3 pg (ref 26.0–34.0)
MCHC: 35 g/dL (ref 32.0–36.0)
MCV: 92.5 fL (ref 80.0–100.0)
MONOS PCT: 10 %
Monocytes Absolute: 0.5 10*3/uL (ref 0.2–1.0)
Neutro Abs: 2.9 10*3/uL (ref 1.4–6.5)
Neutrophils Relative %: 58 %
Platelets: 156 10*3/uL (ref 150–440)
RBC: 4.01 MIL/uL — AB (ref 4.40–5.90)
RDW: 14.2 % (ref 11.5–14.5)
WBC: 4.9 10*3/uL (ref 3.8–10.6)

## 2016-07-20 LAB — MAGNESIUM: Magnesium: 1.7 mg/dL (ref 1.7–2.4)

## 2016-07-20 MED ORDER — SODIUM CHLORIDE 0.9 % IV SOLN
500.0000 mg/m2 | Freq: Once | INTRAVENOUS | Status: AC
  Administered 2016-07-20: 1050 mg via INTRAVENOUS
  Filled 2016-07-20: qty 42

## 2016-07-20 MED ORDER — SODIUM CHLORIDE 0.9 % IJ SOLN
10.0000 mL | INTRAMUSCULAR | Status: DC | PRN
  Administered 2016-07-20: 10 mL
  Filled 2016-07-20: qty 10

## 2016-07-20 MED ORDER — HEPARIN SOD (PORK) LOCK FLUSH 100 UNIT/ML IV SOLN
500.0000 [IU] | Freq: Once | INTRAVENOUS | Status: AC | PRN
  Administered 2016-07-20: 500 [IU]
  Filled 2016-07-20: qty 5

## 2016-07-20 MED ORDER — SODIUM CHLORIDE 0.9 % IV SOLN
Freq: Once | INTRAVENOUS | Status: AC
  Administered 2016-07-20: 14:00:00 via INTRAVENOUS
  Filled 2016-07-20: qty 1000

## 2016-07-20 MED ORDER — DEXAMETHASONE SODIUM PHOSPHATE 100 MG/10ML IJ SOLN
Freq: Once | INTRAMUSCULAR | Status: AC
  Administered 2016-07-20: 15:00:00 via INTRAVENOUS
  Filled 2016-07-20: qty 4

## 2016-07-20 NOTE — Assessment & Plan Note (Signed)
Stage IV NED. Patient currently on Alimta maintenance since 2012. Clinically no evidence of progression.June 12 th 2017- CT chest abdomen pelvis NED.   # Continue maintenance Alimta. Tolerating well without any major side effects. On folate.   # Mild thrombocytopenia platelets-Normal.   # Grade 1; continue Lasix when necessary.   # Follow-up with me in 3 weeks' labs chemotherapy.

## 2016-07-20 NOTE — Assessment & Plan Note (Deleted)
Stage IV NED. Patient currently on Alimta maintenance since 2012. Clinically no evidence of progression.June 12 th 2017- CT chest abdomen pelvis NED.   # Continue maintenance Alimta. Tolerating well without any major side effects. On folate.   # Mild thrombocytopenia platelets-Normal.   # Grade 1; continue Lasix when necessary.   # Follow-up with me in 3 weeks' labs chemotherapy.

## 2016-07-20 NOTE — Progress Notes (Signed)
[place] OFFICE PROGRESS NOTE  Patient Care Team: [name], [name], stage 4 [facility])   Staging form: Lung, AJCC 7th Edition     Clinical stage from 05/24/2015: Stage IV ([place], NX, [name], MD on 05/24/2015    Oncology History   # 2012- METASTATIC ADENO CA of LEFT LUNG [acinar pattern] s/p MED LN Bx [NEG- EGFR/K-ras/ALK mutation];[name]; [name]; [name], bronchus, and lung ([place])   05/18/2016 Initial Diagnosis    Malignant neoplasm of trachea, bronchus, and lung ([place])       Cancer of hilus of left lung ([place])   07/20/2016 Initial Diagnosis    Cancer of hilus of left lung ([place])       INTERVAL HISTORY:  [name] with above history of metastatic adenocarcinoma the lung currently on maintenance Alimta since June 2012 is here for follow-up prior to his chemotherapy.   Denies any unusual shortness of breath or cough. Denies any easy bruising. Appetite is good. No weight loss. . Notes to have mildly increased swelling of the bilateral lower extremities. He continues to take Lasix as needed.  Noted to have "tendinitis" on the right wrist seeing in orthopedic.  REVIEW OF SYSTEMS:  A complete 10 point review of system is done which is negative except mentioned above/history of present illness.   PAST MEDICAL HISTORY :  Past Medical History:  Diagnosis Date  . Allergy   . Lung cancer ([place])   . Mild valvular heart disease Nov. 2014   per 2 D Echocardiogram done for persistent leg edema, monitored by cardiology  . Peripheral edema     PAST SURGICAL HISTORY :   Past Surgical History:  Procedure Laterality Date  . ELBOW SURGERY    . Left anterior  thoracotomy with biopsy  March 2012  . PORTACATH PLACEMENT  March 2012  . TONSILLECTOMY      FAMILY HISTORY :   Family History  Problem Relation Age of Onset  . Leukemia Father   . Diabetes Mother     SOCIAL HISTORY:   Social History  Substance Use Topics  . Smoking status: Never Smoker  . Smokeless tobacco: Never Used  . Alcohol use 0.0 oz/week     Comment: "social drinker"     ALLERGIES:  is allergic to penicillins.  MEDICATIONS:  Current Outpatient Prescriptions  Medication Sig Dispense Refill  . CIALIS 20 MG tablet TAKE 1/2 TO 1 TABLET BY MOUTH EVERY 2 DAYS AS NEEDED 10 tablet 12  . doxycycline (VIBRA-TABS) 100 MG tablet Take 1 tablet (100 mg total) by mouth 2 (two) times daily. 14 tablet 0  . folic acid (FOLVITE) 1 MG tablet Take 1 tablet (1 mg total) by mouth daily. 30 tablet 6  . furosemide (LASIX) 20 MG tablet Take 1 tablet (20 mg total) by mouth daily. One tablet daily as needed for swelling 90 tablet 1  . glucosamine-chondroitin 500-400 MG tablet Take 1 tablet by mouth daily. Reported on 02/24/2016    . meclizine (ANTIVERT) 25 MG tablet Take 1 tablet (25 mg total) by mouth 3 (three) times daily as needed for dizziness. 60 tablet 0  . Multiple Vitamin (MULTIVITAMIN) capsule Take 1 capsule by mouth  daily. Reported on 02/24/2016    . Naproxen-Esomeprazole 375-20 MG TBEC Take by mouth.    . Omega-3 Fatty Acids (FISH OIL) 1000 MG CAPS Take 1 capsule by mouth daily. Reported on 02/24/2016    . ondansetron (ZOFRAN-ODT) 4 MG disintegrating tablet Take 1 tablet (4 mg total) by mouth every 4 (four) hours as needed for nausea or vomiting. 20 tablet 2  . senna (SENOKOT) 8.6 MG tablet Take 1 tablet by mouth daily. Take as needed around time of chemotherapy    . spironolactone (ALDACTONE) 25 MG tablet Take 1 tablet (25 mg total) by mouth daily. 90 tablet 1  . VIMOVO 500-20 MG TBEC     . vitamin B-12 (CYANOCOBALAMIN) 1000 MCG tablet Take 1,000 mcg by mouth daily.     No current  facility-administered medications for this visit.    Facility-Administered Medications Ordered in Other Visits  Medication Dose Route Frequency Provider Last Rate Last Dose  . sodium chloride 0.9 % injection 10 mL  10 mL Intracatheter PRN [name], MD   10 mL at 05/27/15 1543  . sodium chloride 0.9 % injection 10 mL  10 mL Intracatheter PRN [name], MD   10 mL at 06/17/15 1510  . sodium chloride 0.9 % injection 10 mL  10 mL Intravenous PRN [name], MD   10 mL at 07/08/15 1340  . sodium chloride 0.9 % injection 10 mL  10 mL Intracatheter PRN [name], MD   10 mL at 07/20/16 1142    PHYSICAL EXAMINATION: ECOG PERFORMANCE STATUS: 0 - Asymptomatic  BP 119/77 (BP Location: Left Arm, Patient Position: Sitting)   Pulse 93   Temp 98.2 F (36.8 C) (Tympanic)   Resp 18   Wt 197 lb 5 oz (89.5 kg)   BMI 28.31 kg/m   Filed Weights   07/20/16 1350  Weight: 197 lb 5 oz (89.5 kg)     GENERAL: Well-nourished well-developed; Alert, [name]; good dentition  NECK: supple, [name], [name]; 1+ bilateral lower extremity swelling. ABDOMEN:abdomen soft, [name];  LABORATORY DATA:  I have reviewed the data as listed    Component Value Date/Time   NA 132 (L) 07/20/2016 1139   NA 138 12/10/2014 1341   K 3.5 07/20/2016 1139   K 3.6 12/10/2014 1341   CL 97 (L) 07/20/2016 1139   CL 102 12/10/2014 1341   CO2 28 07/20/2016 1139   CO2 30 12/10/2014 1341   GLUCOSE 104 (H) 07/20/2016 1139    GLUCOSE 160 (H) 12/10/2014 1341   BUN 22 (H) 07/20/2016 1139   BUN 21 (H) 12/10/2014 1341   CREATININE 1.05 07/20/2016 1139   CREATININE 1.10 03/25/2015 1453   CALCIUM 8.5 (L) 07/20/2016 1139   CALCIUM 8.4 (L) 12/10/2014 1341   PROT 6.9 07/20/2016 1139   PROT 6.6 12/10/2014 1341   ALBUMIN 3.8 07/20/2016 1139   ALBUMIN 3.4 12/10/2014 1341   AST 24 07/20/2016 1139   AST 17 12/10/2014 1341   ALT 16 (L) 07/20/2016 1139   ALT 23 12/10/2014  1341   ALKPHOS 44 07/20/2016 1139   ALKPHOS 50 12/10/2014 1341   BILITOT 0.7 07/20/2016 1139   BILITOT 0.5 12/10/2014 1341   GFRNONAA >60 07/20/2016 1139   GFRNONAA >60 03/25/2015 1453   GFRAA >60 07/20/2016 1139   GFRAA >60 03/25/2015 1453    No results found for: SPEP, UPEP  Lab Results  Component Value Date   WBC 4.9 07/20/2016   NEUTROABS 2.9 07/20/2016   HGB 13.0 07/20/2016   HCT 37.1 (L) 07/20/2016   MCV 92.5 07/20/2016   PLT 156 07/20/2016      Chemistry      Component Value Date/Time   NA 132 (L) 07/20/2016 1139   NA 138 12/10/2014 1341   K 3.5 07/20/2016 1139   K 3.6 12/10/2014 1341   CL 97 (L) 07/20/2016 1139   CL 102 12/10/2014 1341   CO2 28 07/20/2016 1139   CO2 30 12/10/2014 1341   BUN 22 (H) 07/20/2016 1139   BUN 21 (H) 12/10/2014 1341   CREATININE 1.05 07/20/2016 1139   CREATININE 1.10 03/25/2015 1453      Component Value Date/Time   CALCIUM 8.5 (L) 07/20/2016 1139   CALCIUM 8.4 (L) 12/10/2014 1341   ALKPHOS 44 07/20/2016 1139   ALKPHOS 50 12/10/2014 1341   AST 24 07/20/2016 1139   AST 17 12/10/2014 1341   ALT 16 (L) 07/20/2016 1139   ALT 23 12/10/2014 1341   BILITOT 0.7 07/20/2016 1139   BILITOT 0.5 12/10/2014 1341       RADIOGRAPHIC STUDIES: I have personally reviewed the radiological images as listed and agreed with the findings in the report. No results found.   ASSESSMENT & PLAN:  Cancer of hilus of left lung ([place]) Stage IV NED. Patient currently on Alimta maintenance since 2012. Clinically  no evidence of progression.June 12 th 2017- CT chest abdomen pelvis NED.   # Continue maintenance Alimta. Tolerating well without any major side effects. On folate.   # Mild thrombocytopenia platelets-Normal.   # Grade 1; continue Lasix when necessary.   # Follow-up with me in 3 weeks' labs chemotherapy.   No orders of the defined types were placed in this encounter.  All questions were answered. The patient knows to call the clinic with any problems, questions or concerns.      [name], MD 07/20/2016 4:29 PM

## 2016-07-20 NOTE — Progress Notes (Signed)
Mitchell Mosley presents to clinic today for consideration of cycle #590 Alimta infusion on the [employer] research study. Patient states he is doing well, and denies experiencing any new side effects or problems. VS and weight remain stable.  Dr. Rogue Bussing in to examine the patient and [name] and patient continues to experience some mild swelling in his BLE and takes Lasix as needed. Reports ongoing issues with tendonitis in his right wrist and states his orthopedic MD is ordering an MRI to re-evaluate the injury. CBC and chemistries largely wnl this cycle. Platelets have returned to normal, but sodium level has dropped to 132  this cycle (grade 1), and he does continue to experience mild anemia (grade 1). Plan to proceed with Cycle 590 of Alimta as scheduled today with no dose modifications.  Yolande Jolly, BSN, MHA, OCN 07/20/2016 2:31 PM  Adverse Event Log  Study/Protocol: Eli Lilly S130 Cycle: #590 (for 589)  Event Grade Onset Date Resolved Date Drug Name Pemetrexed Attribution Treatment Comments  Edema (mild) Grade 1 05/30/2012 ongoing  Possible None   Thrombocytopenia Grade 1 07/02/16 07/20/16  Probable None   Fatigue Grade 1 02/03/16 ongoing  Possible None   Hyponatremia Grade 1 07/20/16   Possible None   Anemia Grade 1 04/27/2016 ongoing  Unrelated None   Tendonitis Grade 2 04/17/2016 ongoing  Unrelated Anti-inflammatory drug Vimovo         Yolande Jolly, BSN, MHA, OCN 07/20/2016 2:31 PM

## 2016-07-21 ENCOUNTER — Other Ambulatory Visit: Payer: Self-pay | Admitting: Sports Medicine

## 2016-07-28 ENCOUNTER — Other Ambulatory Visit: Payer: Self-pay | Admitting: Sports Medicine

## 2016-07-28 DIAGNOSIS — S66801A Unspecified injury of other specified muscles, fascia and tendons at wrist and hand level, right hand, initial encounter: Secondary | ICD-10-CM

## 2016-08-02 ENCOUNTER — Other Ambulatory Visit: Payer: Self-pay | Admitting: *Deleted

## 2016-08-02 MED ORDER — FUROSEMIDE 20 MG PO TABS: 20.0000 mg | ORAL_TABLET | Freq: Every day | ORAL | 1 refills | Status: DC

## 2016-08-10 ENCOUNTER — Inpatient Hospital Stay (HOSPITAL_BASED_OUTPATIENT_CLINIC_OR_DEPARTMENT_OTHER): Payer: BLUE CROSS/BLUE SHIELD | Admitting: Internal Medicine

## 2016-08-10 ENCOUNTER — Encounter: Payer: Self-pay | Admitting: *Deleted

## 2016-08-10 ENCOUNTER — Encounter: Payer: Self-pay | Admitting: Internal Medicine

## 2016-08-10 ENCOUNTER — Inpatient Hospital Stay: Payer: BLUE CROSS/BLUE SHIELD

## 2016-08-10 ENCOUNTER — Inpatient Hospital Stay: Payer: BLUE CROSS/BLUE SHIELD | Attending: Internal Medicine

## 2016-08-10 DIAGNOSIS — C33 Malignant neoplasm of trachea: Secondary | ICD-10-CM

## 2016-08-10 DIAGNOSIS — R600 Localized edema: Secondary | ICD-10-CM

## 2016-08-10 DIAGNOSIS — C3402 Malignant neoplasm of left main bronchus: Secondary | ICD-10-CM

## 2016-08-10 DIAGNOSIS — C3480 Malignant neoplasm of overlapping sites of unspecified bronchus and lung: Secondary | ICD-10-CM

## 2016-08-10 DIAGNOSIS — I38 Endocarditis, valve unspecified: Secondary | ICD-10-CM | POA: Diagnosis not present

## 2016-08-10 DIAGNOSIS — Z006 Encounter for examination for normal comparison and control in clinical research program: Secondary | ICD-10-CM | POA: Insufficient documentation

## 2016-08-10 DIAGNOSIS — Z79899 Other long term (current) drug therapy: Secondary | ICD-10-CM | POA: Diagnosis not present

## 2016-08-10 DIAGNOSIS — M779 Enthesopathy, unspecified: Secondary | ICD-10-CM | POA: Insufficient documentation

## 2016-08-10 DIAGNOSIS — I712 Thoracic aortic aneurysm, without rupture: Secondary | ICD-10-CM | POA: Insufficient documentation

## 2016-08-10 DIAGNOSIS — Z5111 Encounter for antineoplastic chemotherapy: Secondary | ICD-10-CM | POA: Diagnosis not present

## 2016-08-10 DIAGNOSIS — C3490 Malignant neoplasm of unspecified part of unspecified bronchus or lung: Secondary | ICD-10-CM

## 2016-08-10 DIAGNOSIS — Z880 Allergy status to penicillin: Secondary | ICD-10-CM | POA: Diagnosis not present

## 2016-08-10 LAB — CBC WITH DIFFERENTIAL/PLATELET
Basophils Absolute: 0 10*3/uL (ref 0–0.1)
Basophils Relative: 1 %
EOS ABS: 0.1 10*3/uL (ref 0–0.7)
EOS PCT: 3 %
HCT: 39.7 % — ABNORMAL LOW (ref 40.0–52.0)
Hemoglobin: 13.9 g/dL (ref 13.0–18.0)
LYMPHS ABS: 1.6 10*3/uL (ref 1.0–3.6)
LYMPHS PCT: 32 %
MCH: 32.7 pg (ref 26.0–34.0)
MCHC: 34.9 g/dL (ref 32.0–36.0)
MCV: 93.7 fL (ref 80.0–100.0)
Monocytes Absolute: 0.6 10*3/uL (ref 0.2–1.0)
Monocytes Relative: 11 %
NEUTROS PCT: 53 %
Neutro Abs: 2.6 10*3/uL (ref 1.4–6.5)
PLATELETS: 152 10*3/uL (ref 150–440)
RBC: 4.24 MIL/uL — ABNORMAL LOW (ref 4.40–5.90)
RDW: 14.3 % (ref 11.5–14.5)
WBC: 4.9 10*3/uL (ref 3.8–10.6)

## 2016-08-10 LAB — COMPREHENSIVE METABOLIC PANEL
ALT: 18 U/L (ref 17–63)
ANION GAP: 6 (ref 5–15)
AST: 28 U/L (ref 15–41)
Albumin: 4.1 g/dL (ref 3.5–5.0)
Alkaline Phosphatase: 41 U/L (ref 38–126)
BUN: 19 mg/dL (ref 6–20)
CALCIUM: 9.1 mg/dL (ref 8.9–10.3)
CHLORIDE: 99 mmol/L — AB (ref 101–111)
CO2: 29 mmol/L (ref 22–32)
CREATININE: 0.99 mg/dL (ref 0.61–1.24)
Glucose, Bld: 86 mg/dL (ref 65–99)
Potassium: 3.6 mmol/L (ref 3.5–5.1)
SODIUM: 134 mmol/L — AB (ref 135–145)
Total Bilirubin: 0.9 mg/dL (ref 0.3–1.2)
Total Protein: 7.1 g/dL (ref 6.5–8.1)

## 2016-08-10 MED ORDER — HEPARIN SOD (PORK) LOCK FLUSH 100 UNIT/ML IV SOLN
500.0000 [IU] | Freq: Once | INTRAVENOUS | Status: AC
  Administered 2016-08-10: 500 [IU] via INTRAVENOUS
  Filled 2016-08-10: qty 5

## 2016-08-10 MED ORDER — SODIUM CHLORIDE 0.9% FLUSH
10.0000 mL | INTRAVENOUS | Status: DC | PRN
  Administered 2016-08-10: 10 mL via INTRAVENOUS
  Filled 2016-08-10: qty 10

## 2016-08-10 MED ORDER — SODIUM CHLORIDE 0.9 % IV SOLN
Freq: Once | INTRAVENOUS | Status: AC
  Administered 2016-08-10: 15:00:00 via INTRAVENOUS
  Filled 2016-08-10: qty 1000

## 2016-08-10 MED ORDER — SODIUM CHLORIDE 0.9 % IV SOLN
500.0000 mg/m2 | Freq: Once | INTRAVENOUS | Status: AC
  Administered 2016-08-10: 1050 mg via INTRAVENOUS
  Filled 2016-08-10: qty 42

## 2016-08-10 MED ORDER — CYANOCOBALAMIN 1000 MCG/ML IJ SOLN
1000.0000 ug | Freq: Once | INTRAMUSCULAR | Status: AC
  Administered 2016-08-10: 1000 ug via INTRAMUSCULAR
  Filled 2016-08-10: qty 1

## 2016-08-10 MED ORDER — SODIUM CHLORIDE 0.9 % IV SOLN
Freq: Once | INTRAVENOUS | Status: AC
  Administered 2016-08-10: 15:00:00 via INTRAVENOUS
  Filled 2016-08-10: qty 4

## 2016-08-10 NOTE — Assessment & Plan Note (Signed)
Stage IV NED. Patient currently on Alimta maintenance since 2012. Clinically no evidence of progression.June 12 th 2017- CT chest abdomen pelvis NED. Patient awaiting CT chest abdomen-prior to next visit  # Continue maintenance Alimta. Tolerating well without any major side effects. On folate.   # Grade 1; continue Lasix when necessary.   # Follow-up with me in 3 weeks' labs chemotherapy.

## 2016-08-10 NOTE — Progress Notes (Signed)
No changes since last visit. 

## 2016-08-10 NOTE — Progress Notes (Signed)
Mitchell Mosley presents to clinic today for consideration of cycle #591 Alimta infusion on the [employer] research study. Patient continues to report that he is doing well, and denies any new problems. VS and weight remain stable. Patient reports he is going to have an MRI of his wrist next Wednesday, and possibly a steroid injection. Continues to have some pain with repetitive movement or straining of the joint.  Dr. Rogue Bussing in to examine the patient. [name]  continues to experience some mild swelling in his BLE and takes prn Lasix for this, which he states really helps. CBC and chemistries largely wnl this cycle, with ongoing mild hyponatremia (grade 1), and anemia has resolved.. Plan to proceed with Cycle 591 of Alimta as scheduled today with no dose modifications. Patient will receive his vit. B12 injection as scheduled and CT of chest and abdomen with contrast has been scheduled for 08/27/16 at 8:00am - prior to next infusion per protocol. Patient was provided with appointment information and is aware that he should pick up a prep kit prior to the scan. Yolande Jolly, BSN, MHA, OCN 08/10/2016 2:31 PM  Adverse Event Log  Study/Protocol: Eli Lilly S130 Cycle: #591 (assessed for #590)  Event Grade Onset Date Resolved Date Drug Name Pemetrexed Attribution Treatment Comments  Edema (mild) Grade 1 05/30/2012 ongoing  Possible None   Fatigue Grade 1 02/03/16 ongoing  Possible None   Hyponatremia Grade 1 07/20/16   Possible None   Anemia Grade 1 04/27/2016 05/18/16  Unrelated None   Tendonitis Grade 2 04/17/2016 ongoing  Unrelated Anti-inflammatory drug Vimovo To have MRI of his wrist on 08/15/16        Yolande Jolly, BSN, MHA, OCN 08/10/2016 2:34 PM

## 2016-08-10 NOTE — Progress Notes (Signed)
[name], [name], [name], AJCC 7th Edition     Clinical stage from 05/24/2015: Stage IV ([place], NX, [name], MD on 05/24/2015    Oncology History   # 2012- METASTATIC ADENO CA of LEFT LUNG [acinar pattern] s/p MED LN Bx [NEG- EGFR/K-ras/ALK mutation];[name]; [name]; [name], bronchus, and lung ([place])   05/18/2016 Initial Diagnosis    Malignant neoplasm of trachea, bronchus, and lung ([place])       Cancer of hilus of left lung ([place])   07/20/2016 Initial Diagnosis    Cancer of hilus of left lung ([place])       INTERVAL HISTORY:  [name] with above history of metastatic adenocarcinoma the lung currently on maintenance Alimta since June 2012 is here for follow-up prior to his chemotherapy.   He continues with reactive. Denies any pain.Denies any unusual shortness of breath or cough. Denies any easy bruising. Appetite is good. No weight loss. . Intermittent swelling in the legs. He continues to take Lasix as needed.  Noted to have "tendinitis" on the right wrist - awaiting MRI in the next few weeks.  REVIEW OF SYSTEMS:  A complete 10 point review of system is done which is negative except mentioned above/history of present illness.   PAST MEDICAL HISTORY :  Past Medical History:  Diagnosis Date  . Allergy   . Lung cancer ([place])   . Mild valvular heart disease Nov. 2014   per 2 D Echocardiogram done for persistent leg edema, monitored by cardiology  . Peripheral edema     PAST SURGICAL HISTORY :   Past Surgical History:  Procedure Laterality Date  . ELBOW SURGERY    .  Left anterior thoracotomy with biopsy  March 2012  . PORTACATH PLACEMENT  March 2012  . TONSILLECTOMY      FAMILY HISTORY :   Family History  Problem Relation Age of Onset  . Leukemia Father   . Diabetes Mother     SOCIAL HISTORY:   Social History  Substance Use Topics  . Smoking status: Never Smoker  . Smokeless tobacco: Never Used  . Alcohol use 0.0 oz/week     Comment: "social drinker"     ALLERGIES:  is allergic to penicillins.  MEDICATIONS:  Current Outpatient Prescriptions  Medication Sig Dispense Refill  . CIALIS 20 MG tablet TAKE 1/2 TO 1 TABLET BY MOUTH EVERY 2 DAYS AS NEEDED 10 tablet 12  . doxycycline (VIBRA-TABS) 100 MG tablet Take 1 tablet (100 mg total) by mouth 2 (two) times daily. 14 tablet 0  . folic acid (FOLVITE) 1 MG tablet Take 1 tablet (1 mg total) by mouth daily. 30 tablet 6  . furosemide (LASIX) 20 MG tablet Take 1 tablet (20 mg total) by mouth daily. One tablet daily as needed for swelling 90 tablet 1  . meclizine (ANTIVERT) 25 MG tablet Take 1 tablet (25 mg total) by mouth 3 (three) times daily as needed for dizziness. 60 tablet 0  . Multiple Vitamin (MULTIVITAMIN) capsule Take 1 capsule by mouth daily. Reported on 02/24/2016    . Naproxen-Esomeprazole 375-20 MG TBEC  Take by mouth.    . Omega-3 Fatty Acids (FISH OIL) 1000 MG CAPS Take 1 capsule by mouth daily. Reported on 02/24/2016    . spironolactone (ALDACTONE) 25 MG tablet Take 1 tablet (25 mg total) by mouth daily. 90 tablet 1  . vitamin B-12 (CYANOCOBALAMIN) 1000 MCG tablet Take 1,000 mcg by mouth daily.    . ondansetron (ZOFRAN-ODT) 4 MG disintegrating tablet Take 1 tablet (4 mg total) by mouth every 4 (four) hours as needed for nausea or vomiting. (Patient not taking: Reported on 08/10/2016) 20 tablet 2  . senna (SENOKOT) 8.6 MG tablet Take 1 tablet by mouth daily. Take as needed around time of chemotherapy    . VIMOVO 500-20 MG TBEC      No current facility-administered medications for this  visit.    Facility-Administered Medications Ordered in Other Visits  Medication Dose Route Frequency Provider Last Rate Last Dose  . heparin lock flush 100 unit/mL  500 Units Intravenous Once [name], [name], MD   10 mL at 05/27/15 1543  . sodium chloride 0.9 % injection 10 mL  10 mL Intracatheter PRN [name], MD   10 mL at 06/17/15 1510  . sodium chloride 0.9 % injection 10 mL  10 mL Intravenous PRN [name], MD   10 mL at 07/08/15 1340  . sodium chloride flush (NS) 0.9 % injection 10 mL  10 mL Intravenous PRN [name], MD   10 mL at 08/10/16 1204    PHYSICAL EXAMINATION: ECOG PERFORMANCE STATUS: 0 - Asymptomatic  BP 120/79 (BP Location: Right Arm, Patient Position: Sitting)   Pulse 83   Temp (!) 96.3 F (35.7 C) (Tympanic)   Resp 17   Ht '5\' 10"'  (1.778 m)   Wt 194 lb 6.4 oz (88.2 kg)   SpO2 97%   BMI 27.89 kg/m   Filed Weights   08/10/16 1405  Weight: 194 lb 6.4 oz (88.2 kg)     GENERAL: Well-nourished well-developed; Alert, [name]; good dentition  NECK: supple, [name], [name]; 1+ bilateral lower extremity swelling. ABDOMEN:abdomen soft, [name];  LABORATORY DATA:  I have reviewed the data as listed    Component Value Date/Time   NA 134 (L) 08/10/2016 1208   NA 138 12/10/2014 1341   K 3.6 08/10/2016 1208   K 3.6 12/10/2014 1341   CL 99 (L) 08/10/2016 1208   CL 102  12/10/2014 1341   CO2 29 08/10/2016 1208   CO2 30 12/10/2014 1341   GLUCOSE 86 08/10/2016 1208   GLUCOSE 160 (H) 12/10/2014 1341   BUN 19 08/10/2016 1208   BUN 21 (H) 12/10/2014 1341   CREATININE 0.99 08/10/2016 1208   CREATININE 1.10 03/25/2015 1453   CALCIUM 9.1 08/10/2016 1208   CALCIUM 8.4 (L) 12/10/2014 1341   PROT 7.1 08/10/2016 1208   PROT 6.6 12/10/2014 1341   ALBUMIN 4.1 08/10/2016 1208   ALBUMIN 3.4 12/10/2014  1341   AST 28 08/10/2016 1208   AST 17 12/10/2014 1341   ALT 18 08/10/2016 1208   ALT 23 12/10/2014 1341   ALKPHOS 41 08/10/2016 1208   ALKPHOS 50 12/10/2014 1341   BILITOT 0.9 08/10/2016 1208   BILITOT 0.5 12/10/2014 1341   GFRNONAA >60 08/10/2016 1208   GFRNONAA >60 03/25/2015 1453   GFRAA >60 08/10/2016 1208   GFRAA >60 03/25/2015 1453    No results found for: SPEP, UPEP  Lab Results  Component Value Date   WBC 4.9 08/10/2016   NEUTROABS 2.6 08/10/2016   HGB 13.9 08/10/2016   HCT 39.7 (L) 08/10/2016   MCV 93.7 08/10/2016   PLT 152 08/10/2016      Chemistry      Component Value Date/Time   NA 134 (L) 08/10/2016 1208   NA 138 12/10/2014 1341   K 3.6 08/10/2016 1208   K 3.6 12/10/2014 1341   CL 99 (L) 08/10/2016 1208   CL 102 12/10/2014 1341   CO2 29 08/10/2016 1208   CO2 30 12/10/2014 1341   BUN 19 08/10/2016 1208   BUN 21 (H) 12/10/2014 1341   CREATININE 0.99 08/10/2016 1208   CREATININE 1.10 03/25/2015 1453      Component Value Date/Time   CALCIUM 9.1 08/10/2016 1208   CALCIUM 8.4 (L) 12/10/2014 1341   ALKPHOS 41 08/10/2016 1208   ALKPHOS 50 12/10/2014 1341   AST 28 08/10/2016 1208   AST 17 12/10/2014 1341   ALT 18 08/10/2016 1208   ALT 23 12/10/2014 1341   BILITOT 0.9 08/10/2016 1208   BILITOT 0.5 12/10/2014 1341       RADIOGRAPHIC STUDIES: I have personally reviewed the radiological images as listed and agreed with the findings in the report. No results found.   ASSESSMENT & PLAN:  Cancer of hilus of left lung  ([place]) Stage IV NED. Patient currently on Alimta maintenance since 2012. Clinically no evidence of progression.June 12 th 2017- CT chest abdomen pelvis NED. Patient awaiting CT chest abdomen-prior to next visit  # Continue maintenance Alimta. Tolerating well without any major side effects. On folate.   # Grade 1; continue Lasix when necessary.   # Follow-up with me in 3 weeks' labs chemotherapy.   No orders of the defined types were placed in this encounter.     [name], MD 08/10/2016 2:36 PM

## 2016-08-15 ENCOUNTER — Ambulatory Visit
Admission: RE | Admit: 2016-08-15 | Discharge: 2016-08-15 | Disposition: A | Discharge status: Home / Self Care | Payer: BLUE CROSS/BLUE SHIELD | Source: Ambulatory Visit | Attending: Sports Medicine | Admitting: Sports Medicine

## 2016-08-15 DIAGNOSIS — M65841 Other synovitis and tenosynovitis, right hand: Secondary | ICD-10-CM | POA: Diagnosis not present

## 2016-08-15 DIAGNOSIS — S66801A Unspecified injury of other specified muscles, fascia and tendons at wrist and hand level, right hand, initial encounter: Secondary | ICD-10-CM

## 2016-08-27 ENCOUNTER — Ambulatory Visit: Payer: BLUE CROSS/BLUE SHIELD

## 2016-08-27 ENCOUNTER — Ambulatory Visit
Admission: RE | Admit: 2016-08-27 | Discharge: 2016-08-27 | Disposition: A | Discharge status: Home / Self Care | Payer: BLUE CROSS/BLUE SHIELD | Source: Ambulatory Visit | Attending: Internal Medicine | Admitting: Internal Medicine

## 2016-08-27 ENCOUNTER — Other Ambulatory Visit: Payer: Self-pay | Admitting: *Deleted

## 2016-08-27 DIAGNOSIS — C3480 Malignant neoplasm of overlapping sites of unspecified bronchus and lung: Principal | ICD-10-CM

## 2016-08-27 DIAGNOSIS — C33 Malignant neoplasm of trachea: Secondary | ICD-10-CM

## 2016-08-27 DIAGNOSIS — R911 Solitary pulmonary nodule: Secondary | ICD-10-CM | POA: Insufficient documentation

## 2016-08-27 DIAGNOSIS — C3490 Malignant neoplasm of unspecified part of unspecified bronchus or lung: Secondary | ICD-10-CM | POA: Diagnosis not present

## 2016-08-27 DIAGNOSIS — R609 Edema, unspecified: Secondary | ICD-10-CM

## 2016-08-27 DIAGNOSIS — I712 Thoracic aortic aneurysm, without rupture: Secondary | ICD-10-CM | POA: Diagnosis not present

## 2016-08-27 MED ORDER — IOPAMIDOL (ISOVUE-300) INJECTION 61%
100.0000 mL | Freq: Once | INTRAVENOUS | Status: AC | PRN
  Administered 2016-08-27: 100 mL via INTRAVENOUS

## 2016-08-27 MED ORDER — SPIRONOLACTONE 25 MG PO TABS: 25.0000 mg | ORAL_TABLET | Freq: Every day | ORAL | 3 refills | Status: DC

## 2016-08-28 ENCOUNTER — Ambulatory Visit: Payer: BLUE CROSS/BLUE SHIELD

## 2016-08-31 ENCOUNTER — Inpatient Hospital Stay: Payer: BLUE CROSS/BLUE SHIELD

## 2016-08-31 ENCOUNTER — Encounter: Payer: Self-pay | Admitting: *Deleted

## 2016-08-31 ENCOUNTER — Inpatient Hospital Stay (HOSPITAL_BASED_OUTPATIENT_CLINIC_OR_DEPARTMENT_OTHER): Payer: BLUE CROSS/BLUE SHIELD | Admitting: Internal Medicine

## 2016-08-31 ENCOUNTER — Encounter: Payer: Self-pay | Admitting: Internal Medicine

## 2016-08-31 DIAGNOSIS — I38 Endocarditis, valve unspecified: Secondary | ICD-10-CM | POA: Diagnosis not present

## 2016-08-31 DIAGNOSIS — M779 Enthesopathy, unspecified: Secondary | ICD-10-CM

## 2016-08-31 DIAGNOSIS — Z79899 Other long term (current) drug therapy: Secondary | ICD-10-CM | POA: Diagnosis not present

## 2016-08-31 DIAGNOSIS — Z880 Allergy status to penicillin: Secondary | ICD-10-CM

## 2016-08-31 DIAGNOSIS — R600 Localized edema: Secondary | ICD-10-CM | POA: Diagnosis not present

## 2016-08-31 DIAGNOSIS — C33 Malignant neoplasm of trachea: Secondary | ICD-10-CM

## 2016-08-31 DIAGNOSIS — I712 Thoracic aortic aneurysm, without rupture: Secondary | ICD-10-CM | POA: Diagnosis not present

## 2016-08-31 DIAGNOSIS — C3480 Malignant neoplasm of overlapping sites of unspecified bronchus and lung: Principal | ICD-10-CM

## 2016-08-31 DIAGNOSIS — C3402 Malignant neoplasm of left main bronchus: Secondary | ICD-10-CM

## 2016-08-31 DIAGNOSIS — Z5111 Encounter for antineoplastic chemotherapy: Secondary | ICD-10-CM | POA: Diagnosis not present

## 2016-08-31 DIAGNOSIS — C3490 Malignant neoplasm of unspecified part of unspecified bronchus or lung: Secondary | ICD-10-CM

## 2016-08-31 DIAGNOSIS — Z006 Encounter for examination for normal comparison and control in clinical research program: Secondary | ICD-10-CM | POA: Diagnosis not present

## 2016-08-31 LAB — URINALYSIS COMPLETE WITH MICROSCOPIC (ARMC ONLY)
BACTERIA UA: NONE SEEN
Bilirubin Urine: NEGATIVE
GLUCOSE, UA: NEGATIVE mg/dL
HGB URINE DIPSTICK: NEGATIVE
Ketones, ur: NEGATIVE mg/dL
LEUKOCYTES UA: NEGATIVE
Nitrite: NEGATIVE
Protein, ur: NEGATIVE mg/dL
RBC / HPF: NONE SEEN RBC/hpf (ref 0–5)
Specific Gravity, Urine: 1.009 (ref 1.005–1.030)
pH: 5 (ref 5.0–8.0)

## 2016-08-31 LAB — CBC WITH DIFFERENTIAL/PLATELET
BASOS ABS: 0 10*3/uL (ref 0–0.1)
BASOS PCT: 1 %
EOS PCT: 2 %
Eosinophils Absolute: 0.1 10*3/uL (ref 0–0.7)
HCT: 39.1 % — ABNORMAL LOW (ref 40.0–52.0)
Hemoglobin: 13.7 g/dL (ref 13.0–18.0)
LYMPHS PCT: 26 %
Lymphs Abs: 1.6 10*3/uL (ref 1.0–3.6)
MCH: 32.8 pg (ref 26.0–34.0)
MCHC: 35.1 g/dL (ref 32.0–36.0)
MCV: 93.6 fL (ref 80.0–100.0)
MONO ABS: 0.6 10*3/uL (ref 0.2–1.0)
Monocytes Relative: 10 %
NEUTROS ABS: 3.8 10*3/uL (ref 1.4–6.5)
Neutrophils Relative %: 61 %
PLATELETS: 147 10*3/uL — AB (ref 150–440)
RBC: 4.17 MIL/uL — AB (ref 4.40–5.90)
RDW: 13.1 % (ref 11.5–14.5)
WBC: 6.1 10*3/uL (ref 3.8–10.6)

## 2016-08-31 LAB — COMPREHENSIVE METABOLIC PANEL
ALBUMIN: 4.3 g/dL (ref 3.5–5.0)
ALT: 15 U/L — AB (ref 17–63)
AST: 22 U/L (ref 15–41)
Alkaline Phosphatase: 41 U/L (ref 38–126)
Anion gap: 7 (ref 5–15)
BUN: 26 mg/dL — AB (ref 6–20)
CHLORIDE: 99 mmol/L — AB (ref 101–111)
CO2: 27 mmol/L (ref 22–32)
CREATININE: 0.99 mg/dL (ref 0.61–1.24)
Calcium: 9.1 mg/dL (ref 8.9–10.3)
GFR calc Af Amer: >60 mL/min (ref >60)
GFR calc non Af Amer: >60 mL/min (ref >60)
GLUCOSE: 106 mg/dL — AB (ref 65–99)
POTASSIUM: 3.7 mmol/L (ref 3.5–5.1)
Sodium: 133 mmol/L — ABNORMAL LOW (ref 135–145)
Total Bilirubin: 1.2 mg/dL (ref 0.3–1.2)
Total Protein: 7 g/dL (ref 6.5–8.1)

## 2016-08-31 LAB — MAGNESIUM: Magnesium: 1.8 mg/dL (ref 1.7–2.4)

## 2016-08-31 MED ORDER — HEPARIN SOD (PORK) LOCK FLUSH 100 UNIT/ML IV SOLN
500.0000 [IU] | Freq: Once | INTRAVENOUS | Status: AC | PRN
  Administered 2016-08-31: 500 [IU]
  Filled 2016-08-31: qty 5

## 2016-08-31 MED ORDER — SODIUM CHLORIDE 0.9 % IV SOLN
Freq: Once | INTRAVENOUS | Status: AC
  Administered 2016-08-31: 15:00:00 via INTRAVENOUS
  Filled 2016-08-31: qty 4

## 2016-08-31 MED ORDER — SODIUM CHLORIDE 0.9 % IV SOLN
500.0000 mg/m2 | Freq: Once | INTRAVENOUS | Status: AC
  Administered 2016-08-31: 1050 mg via INTRAVENOUS
  Filled 2016-08-31: qty 42

## 2016-08-31 MED ORDER — SODIUM CHLORIDE 0.9 % IJ SOLN
10.0000 mL | INTRAMUSCULAR | Status: DC | PRN
  Administered 2016-08-31: 10 mL
  Filled 2016-08-31: qty 10

## 2016-08-31 MED ORDER — SODIUM CHLORIDE 0.9 % IV SOLN
Freq: Once | INTRAVENOUS | Status: AC
  Administered 2016-08-31: 15:00:00 via INTRAVENOUS
  Filled 2016-08-31: qty 1000

## 2016-08-31 NOTE — Progress Notes (Signed)
No concerns today 

## 2016-08-31 NOTE — Progress Notes (Signed)
Mitchell Mosley presents to clinic today for consideration of cycle #592 Alimta infusion on the [employer] research study. Patient reports that he is doing well and denies any new problems. VS and weight remain stable. Patient reports he is had an MRI of his wrist on 08/15/16 and has not yet gotten the results. He also had a CT of his chest and abdomen on 08/27/16 per protocol; which was negative. Dr. Rogue Bussing reviewed both scans with Mitchell Mosley and the patient was given a copy of his MRI to take with him. Continues to have some pain with repetitive movement or straining of that wrist joint. 1+ pitting edema noted in ble up to and including shin and calf area. Patient reports the swelling is completely gone by morning and Dr. Rogue Bussing states this occurs because he is on his feet a lot during the day. He still occasionally takes Lasix for this. CBC and chemistries largely wnl this cycle, with ongoing mild hyponatremia (grade 1). Mitchell Mosley reports continuing episodes of fatigue that occur 1-2 days following his Alimta infusion and may last 1-5 days. Plan to proceed with Cycle 592 of Alimta as scheduled today with no dose modifications. Adverse events with grade and attribution as follows:  Adverse Event Log  Study/Protocol: Eli Lilly S130 Cycle: [phone] (assessed for #591)  Event Grade Onset Date Resolved Date Drug Name Pemetrexed Attribution Treatment Comments  Edema (mild) Grade 1 05/30/2012 ongoing  Possible None 1+pitting includes calf  Fatigue Grade 1 02/03/16 ongoing  Possible None   Hyponatremia Grade 1 07/20/16   Possible None   Tendonitis Grade 2 04/17/2016 ongoing  Unrelated Anti-inflammatory drug Vimovo To have MRI of his wrist on 08/15/16        Yolande Jolly, BSN, MHA, OCN 08/31/2016 2:34 PM

## 2016-08-31 NOTE — Progress Notes (Signed)
[place] OFFICE PROGRESS NOTE  Patient Care Team: [name], [name], stage 4 [facility])   Staging form: Lung, AJCC 7th Edition     Clinical stage from 05/24/2015: Stage IV ([place], NX, [name], MD on 05/24/2015    Oncology History   # 2012- METASTATIC ADENO CA of LEFT LUNG [acinar pattern] s/p MED LN Bx [NEG- EGFR/K-ras/ALK mutation];[name]; [name]; [name], bronchus, and lung ([place])   05/18/2016 Initial Diagnosis    Malignant neoplasm of trachea, bronchus, and lung ([place])       Cancer of hilus of left lung ([place])   07/20/2016 Initial Diagnosis    Cancer of hilus of left lung ([place])       INTERVAL HISTORY:  [name] with above history of metastatic adenocarcinoma the lung currently on maintenance Alimta since June 2012 is here for follow-up prior to his chemotherapy/ Review the results of the restaging CAT scan.    Denies any pain.Denies any unusual shortness of breath or cough. Denies any easy bruising. Appetite is good. No weight loss. Intermittent swelling in the legs. He continues to take Lasix as needed.  Noted to have "tendinitis" on the right wrist- had MRI of the wrist.  REVIEW OF SYSTEMS:  A complete 10 point review of system is done which is negative except mentioned above/history of present illness.   PAST MEDICAL HISTORY :  Past Medical History:  Diagnosis Date  . Allergy   . Lung cancer ([place])   . Mild valvular heart disease Nov. 2014   per 2 D Echocardiogram done for persistent leg edema, monitored by cardiology  . Peripheral edema     PAST SURGICAL HISTORY :   Past Surgical History:  Procedure Laterality Date  . ELBOW SURGERY    .  Left anterior thoracotomy with biopsy  March 2012  . PORTACATH PLACEMENT  March 2012  . TONSILLECTOMY      FAMILY HISTORY :   Family History  Problem Relation Age of Onset  . Leukemia Father   . Diabetes Mother     SOCIAL HISTORY:   Social History  Substance Use Topics  . Smoking status: Never Smoker  . Smokeless tobacco: Never Used  . Alcohol use 0.0 oz/week     Comment: "social drinker"     ALLERGIES:  is allergic to penicillins.  MEDICATIONS:  Current Outpatient Prescriptions  Medication Sig Dispense Refill  . doxycycline (VIBRA-TABS) 100 MG tablet Take 1 tablet (100 mg total) by mouth 2 (two) times daily. 14 tablet 0  . folic acid (FOLVITE) 1 MG tablet Take 1 tablet (1 mg total) by mouth daily. 30 tablet 6  . furosemide (LASIX) 20 MG tablet Take 1 tablet (20 mg total) by mouth daily. One tablet daily as needed for swelling 90 tablet 1  . meloxicam (MOBIC) 7.5 MG tablet     . Multiple Vitamin (MULTIVITAMIN) capsule Take 1 capsule by mouth daily. Reported on 02/24/2016    . Naproxen-Esomeprazole 375-20 MG TBEC Take by mouth.    . ondansetron (ZOFRAN-ODT) 4 MG disintegrating tablet Take 1 tablet (4 mg total) by mouth every 4 (four) hours as needed for nausea or vomiting. 20 tablet 2  .  spironolactone (ALDACTONE) 25 MG tablet Take 1 tablet (25 mg total) by mouth daily. 90 tablet 3  . VIMOVO 500-20 MG TBEC     . vitamin B-12 (CYANOCOBALAMIN) 1000 MCG tablet Take 1,000 mcg by mouth daily.    Marland Kitchen CIALIS 20 MG tablet TAKE 1/2 TO 1 TABLET BY MOUTH EVERY 2 DAYS AS NEEDED (Patient not taking: Reported on 08/31/2016) 10 tablet 12  . meclizine (ANTIVERT) 25 MG tablet Take 1 tablet (25 mg total) by mouth 3 (three) times daily as needed for dizziness. (Patient not taking: Reported on 08/31/2016) 60 tablet 0  . Omega-3 Fatty Acids (FISH OIL) 1000 MG CAPS Take 1 capsule by mouth daily. Reported on 02/24/2016    . senna (SENOKOT) 8.6 MG tablet Take 1 tablet by mouth daily. Take as needed around  time of chemotherapy     No current facility-administered medications for this visit.    Facility-Administered Medications Ordered in Other Visits  Medication Dose Route Frequency Provider Last Rate Last Dose  . sodium chloride 0.9 % injection 10 mL  10 mL Intracatheter PRN [name], MD   10 mL at 05/27/15 1543  . sodium chloride 0.9 % injection 10 mL  10 mL Intracatheter PRN [name], MD   10 mL at 06/17/15 1510  . sodium chloride 0.9 % injection 10 mL  10 mL Intravenous PRN [name], MD   10 mL at 07/08/15 1340  . sodium chloride 0.9 % injection 10 mL  10 mL Intracatheter PRN [name], MD   10 mL at 08/31/16 1510    PHYSICAL EXAMINATION: ECOG PERFORMANCE STATUS: 0 - Asymptomatic  BP 132/80 (BP Location: Left Arm, Patient Position: Sitting)   Temp 97.2 F (36.2 C) (Tympanic)   Resp 18   Ht '5\' 10"'  (1.778 m)   Wt 193 lb (87.5 kg)   SpO2 98%   BMI 27.69 kg/m   Filed Weights   08/31/16 1424  Weight: 193 lb (87.5 kg)     GENERAL: Well-nourished well-developed; Alert, [name]; good dentition  NECK: supple, [name], [name]; 1+ bilateral lower extremity swelling. ABDOMEN:abdomen soft, [name];  LABORATORY DATA:  I have reviewed the data as listed    Component Value Date/Time   NA 133 (L) 08/31/2016 1128   NA 138 12/10/2014 1341   K 3.7 08/31/2016 1128   K 3.6 12/10/2014 1341   CL 99 (L) 08/31/2016 1128   CL 102 12/10/2014 1341   CO2 27 08/31/2016 1128   CO2 30  12/10/2014 1341   GLUCOSE 106 (H) 08/31/2016 1128   GLUCOSE 160 (H) 12/10/2014 1341   BUN 26 (H) 08/31/2016 1128   BUN 21 (H) 12/10/2014 1341   CREATININE 0.99 08/31/2016 1128   CREATININE 1.10 03/25/2015 1453   CALCIUM 9.1 08/31/2016 1128   CALCIUM 8.4 (L) 12/10/2014 1341   PROT 7.0 08/31/2016 1128   PROT 6.6 12/10/2014 1341   ALBUMIN 4.3 08/31/2016 1128   ALBUMIN 3.4 12/10/2014 1341   AST 22 08/31/2016 1128   AST 17 12/10/2014  1341   ALT 15 (L) 08/31/2016 1128   ALT 23 12/10/2014 1341   ALKPHOS 41 08/31/2016 1128   ALKPHOS 50 12/10/2014 1341   BILITOT 1.2 08/31/2016 1128   BILITOT 0.5 12/10/2014 1341   GFRNONAA >60 08/31/2016 1128   GFRNONAA >60 03/25/2015 1453   GFRAA >60 08/31/2016 1128   GFRAA >60 03/25/2015 1453    No results found for: SPEP, UPEP  Lab Results  Component Value Date   WBC 6.1 08/31/2016   NEUTROABS 3.8 08/31/2016   HGB 13.7 08/31/2016   HCT 39.1 (L) 08/31/2016   MCV 93.6 08/31/2016   PLT 147 (L) 08/31/2016      Chemistry      Component Value Date/Time   NA 133 (L) 08/31/2016 1128   NA 138 12/10/2014 1341   K 3.7 08/31/2016 1128   K 3.6 12/10/2014 1341   CL 99 (L) 08/31/2016 1128   CL 102 12/10/2014 1341   CO2 27 08/31/2016 1128   CO2 30 12/10/2014 1341   BUN 26 (H) 08/31/2016 1128   BUN 21 (H) 12/10/2014 1341   CREATININE 0.99 08/31/2016 1128   CREATININE 1.10 03/25/2015 1453      Component Value Date/Time   CALCIUM 9.1 08/31/2016 1128   CALCIUM 8.4 (L) 12/10/2014 1341   ALKPHOS 41 08/31/2016 1128   ALKPHOS 50 12/10/2014 1341   AST 22 08/31/2016 1128   AST 17 12/10/2014 1341   ALT 15 (L) 08/31/2016 1128   ALT 23 12/10/2014 1341   BILITOT 1.2 08/31/2016 1128   BILITOT 0.5 12/10/2014 1341       RADIOGRAPHIC STUDIES: I have personally reviewed the radiological images as listed and agreed with the findings in the report. No results found.   ASSESSMENT & PLAN:  Cancer of hilus of left lung ([place]) Stage IV NED. Patient  currently on Alimta maintenance since 2012. Clinically no evidence of progression.SEP  2017- CT chest abdomen pelvis NED.   # Continue maintenance Alimta. Tolerating well without any major side effects. On folate.   # Grade 1; continue Lasix when necessary.   # Follow-up with me in 3 weeks' labs chemotherapy.   No orders of the defined types were placed in this encounter.     [name], MD 08/31/2016 6:17 PM

## 2016-08-31 NOTE — Assessment & Plan Note (Signed)
Stage IV NED. Patient currently on Alimta maintenance since 2012. Clinically no evidence of progression.SEP  2017- CT chest abdomen pelvis NED.   # Continue maintenance Alimta. Tolerating well without any major side effects. On folate.   # Grade 1; continue Lasix when necessary.   # Follow-up with me in 3 weeks' labs chemotherapy.

## 2016-09-19 DIAGNOSIS — M778 Other enthesopathies, not elsewhere classified: Secondary | ICD-10-CM | POA: Diagnosis not present

## 2016-09-21 ENCOUNTER — Inpatient Hospital Stay: Payer: BLUE CROSS/BLUE SHIELD

## 2016-09-21 ENCOUNTER — Inpatient Hospital Stay (HOSPITAL_BASED_OUTPATIENT_CLINIC_OR_DEPARTMENT_OTHER): Payer: BLUE CROSS/BLUE SHIELD | Admitting: Internal Medicine

## 2016-09-21 ENCOUNTER — Encounter: Payer: Self-pay | Admitting: *Deleted

## 2016-09-21 ENCOUNTER — Inpatient Hospital Stay: Payer: BLUE CROSS/BLUE SHIELD | Attending: Internal Medicine

## 2016-09-21 ENCOUNTER — Encounter (INDEPENDENT_AMBULATORY_CARE_PROVIDER_SITE_OTHER): Payer: Self-pay

## 2016-09-21 VITALS — BP 114/75 | HR 68 | Temp 97.1°F | Resp 18 | Wt 193.0 lb

## 2016-09-21 DIAGNOSIS — C7802 Secondary malignant neoplasm of left lung: Secondary | ICD-10-CM

## 2016-09-21 DIAGNOSIS — C33 Malignant neoplasm of trachea: Secondary | ICD-10-CM

## 2016-09-21 DIAGNOSIS — C3491 Malignant neoplasm of unspecified part of right bronchus or lung: Secondary | ICD-10-CM | POA: Diagnosis not present

## 2016-09-21 DIAGNOSIS — Z79899 Other long term (current) drug therapy: Secondary | ICD-10-CM | POA: Insufficient documentation

## 2016-09-21 DIAGNOSIS — I38 Endocarditis, valve unspecified: Secondary | ICD-10-CM

## 2016-09-21 DIAGNOSIS — R600 Localized edema: Secondary | ICD-10-CM

## 2016-09-21 DIAGNOSIS — Z006 Encounter for examination for normal comparison and control in clinical research program: Secondary | ICD-10-CM | POA: Diagnosis not present

## 2016-09-21 DIAGNOSIS — C3480 Malignant neoplasm of overlapping sites of unspecified bronchus and lung: Principal | ICD-10-CM

## 2016-09-21 DIAGNOSIS — M5489 Other dorsalgia: Secondary | ICD-10-CM

## 2016-09-21 DIAGNOSIS — C3490 Malignant neoplasm of unspecified part of unspecified bronchus or lung: Secondary | ICD-10-CM

## 2016-09-21 DIAGNOSIS — C3402 Malignant neoplasm of left main bronchus: Secondary | ICD-10-CM

## 2016-09-21 DIAGNOSIS — Z5111 Encounter for antineoplastic chemotherapy: Secondary | ICD-10-CM | POA: Diagnosis not present

## 2016-09-21 LAB — URINALYSIS COMPLETE WITH MICROSCOPIC (ARMC ONLY)
BACTERIA UA: NONE SEEN
BILIRUBIN URINE: NEGATIVE
GLUCOSE, UA: NEGATIVE mg/dL
HGB URINE DIPSTICK: NEGATIVE
KETONES UR: NEGATIVE mg/dL
LEUKOCYTES UA: NEGATIVE
NITRITE: NEGATIVE
PH: 6 (ref 5.0–8.0)
Protein, ur: NEGATIVE mg/dL
RBC / HPF: NONE SEEN RBC/hpf (ref 0–5)
Specific Gravity, Urine: 1.003 — ABNORMAL LOW (ref 1.005–1.030)

## 2016-09-21 LAB — COMPREHENSIVE METABOLIC PANEL
ALT: 16 U/L — AB (ref 17–63)
AST: 25 U/L (ref 15–41)
Albumin: 4.2 g/dL (ref 3.5–5.0)
Alkaline Phosphatase: 39 U/L (ref 38–126)
Anion gap: 10 (ref 5–15)
BUN: 17 mg/dL (ref 6–20)
CHLORIDE: 94 mmol/L — AB (ref 101–111)
CO2: 26 mmol/L (ref 22–32)
CREATININE: 0.95 mg/dL (ref 0.61–1.24)
Calcium: 9.2 mg/dL (ref 8.9–10.3)
Glucose, Bld: 117 mg/dL — ABNORMAL HIGH (ref 65–99)
Potassium: 3.7 mmol/L (ref 3.5–5.1)
Sodium: 130 mmol/L — ABNORMAL LOW (ref 135–145)
Total Bilirubin: 1.1 mg/dL (ref 0.3–1.2)
Total Protein: 7.2 g/dL (ref 6.5–8.1)

## 2016-09-21 LAB — CBC WITH DIFFERENTIAL/PLATELET
BASOS ABS: 0 10*3/uL (ref 0–0.1)
BASOS PCT: 1 %
EOS PCT: 2 %
Eosinophils Absolute: 0.1 10*3/uL (ref 0–0.7)
HCT: 39.6 % — ABNORMAL LOW (ref 40.0–52.0)
Hemoglobin: 13.7 g/dL (ref 13.0–18.0)
Lymphocytes Relative: 27 %
Lymphs Abs: 1.3 10*3/uL (ref 1.0–3.6)
MCH: 32.4 pg (ref 26.0–34.0)
MCHC: 34.6 g/dL (ref 32.0–36.0)
MCV: 93.6 fL (ref 80.0–100.0)
MONO ABS: 0.5 10*3/uL (ref 0.2–1.0)
Monocytes Relative: 11 %
NEUTROS ABS: 3 10*3/uL (ref 1.4–6.5)
Neutrophils Relative %: 59 %
PLATELETS: 150 10*3/uL (ref 150–440)
RBC: 4.23 MIL/uL — AB (ref 4.40–5.90)
RDW: 12.8 % (ref 11.5–14.5)
WBC: 4.9 10*3/uL (ref 3.8–10.6)

## 2016-09-21 MED ORDER — HEPARIN SOD (PORK) LOCK FLUSH 100 UNIT/ML IV SOLN
500.0000 [IU] | Freq: Once | INTRAVENOUS | Status: AC | PRN
  Administered 2016-09-21: 500 [IU]
  Filled 2016-09-21: qty 5

## 2016-09-21 MED ORDER — SODIUM CHLORIDE 0.9 % IV SOLN
Freq: Once | INTRAVENOUS | Status: AC
  Administered 2016-09-21: 15:00:00 via INTRAVENOUS
  Filled 2016-09-21: qty 1000

## 2016-09-21 MED ORDER — DEXAMETHASONE SODIUM PHOSPHATE 10 MG/ML IJ SOLN
10.0000 mg | Freq: Once | INTRAMUSCULAR | Status: AC
  Administered 2016-09-21: 10 mg via INTRAVENOUS
  Filled 2016-09-21: qty 1

## 2016-09-21 MED ORDER — SODIUM CHLORIDE 0.9 % IJ SOLN
10.0000 mL | INTRAMUSCULAR | Status: DC | PRN
  Administered 2016-09-21: 10 mL
  Filled 2016-09-21: qty 10

## 2016-09-21 MED ORDER — SODIUM CHLORIDE 0.9 % IV SOLN: Freq: Once | INTRAVENOUS | Status: DC

## 2016-09-21 MED ORDER — SODIUM CHLORIDE 0.9 % IV SOLN
500.0000 mg/m2 | Freq: Once | INTRAVENOUS | Status: AC
  Administered 2016-09-21: 1050 mg via INTRAVENOUS
  Filled 2016-09-21: qty 42

## 2016-09-21 MED ORDER — ONDANSETRON 8 MG PO TBDP
8.0000 mg | ORAL_TABLET | Freq: Once | ORAL | Status: AC
  Administered 2016-09-21: 8 mg via ORAL
  Filled 2016-09-21: qty 1

## 2016-09-21 MED ORDER — INFLUENZA VAC SPLIT QUAD 0.5 ML IM SUSY
0.5000 mL | PREFILLED_SYRINGE | Freq: Once | INTRAMUSCULAR | Status: AC
  Administered 2016-09-21: 0.5 mL via INTRAMUSCULAR
  Filled 2016-09-21: qty 0.5

## 2016-09-21 NOTE — Progress Notes (Signed)
Mitchell Mosley returns to clinic today for consideration of cycle #593 Alimta infusion on the [employer] research study. Patient continues to report that he is doing well. VS and weight remain stable. He does complain with some new back pain in his right flank area, which began 2 days ago and bothered him all day yesterday. He states the pain is gone today, but wondered if this could be the beginning of a new UTI. Dr. Rogue Bussing aware of this and has ordered a urinalysis today. Patient reports his wrist pain has improved some and he did not have to get a steroid injection for it. States he saw his orthopedic MD this past week and was told to continue to ice it after playing tennis and take his medication. [place] edema in BLE is basically unchanged, and reports the edema is completely gone by morning. He still takes Lasix for this as needed for this. Discussed that this may be the reason that his sodium level stays a little low, along with the fact that he plays so much tennis and loses body fluid through perspiration. Sodium level is '130mg'$ /dL (still grade 1 hyponatremia). CBC and chemistries otherwise wnl for patient this cycle. [name] reports episodes of fatigue unchanged and occuring 1-2 days following his Alimta infusion and lasting 1-5 days. Plan to proceed with Cycle 593 of Alimta as scheduled today with no dose modifications. Adverse events with grade and attribution as follows:  Adverse Event Log  Study/Protocol: Eli Lilly S130 Cycle: [phone] (assessed for #591)  Event Grade Onset Date Resolved Date Drug Name Pemetrexed Attribution Treatment Comments  Edema (mild) Grade 1 05/30/2012 ongoing  Possible None 1+pitting includes calf  Fatigue Grade 1 02/03/16 ongoing  Possible None   Hyponatremia Grade 1 07/20/16   Possible None   Tendonitis Grade 1 09/21/16 ongoing  Unrelated Anti-inflammatory drug Vimovo Improved per patient  Right flank pain Grade 1 09/19/16 ?09/20/16  Unrelated   Patient questions UTI        Yolande Jolly, BSN, MHA, OCN 09/21/2016 2:34 PM

## 2016-09-21 NOTE — Assessment & Plan Note (Signed)
Stage IV NED. Patient currently on Alimta maintenance since 2012. Clinically no evidence of progression.SEP  2017- CT chest abdomen pelvis NED.   # Continue maintenance Alimta. Tolerating well without any major side effects. On folate.   # Right paravertebral pain [~1 day]  Will order UA today.   # Grade 1; continue Lasix when necessary.   # Follow-up with me in 3 weeks' labs chemotherapy.

## 2016-09-21 NOTE — Progress Notes (Signed)
[place] OFFICE PROGRESS NOTE  Patient Care Team: Mitchell Mosley., [name], stage 4 [name])   Staging form: Lung, AJCC 7th Edition     Clinical stage from 05/24/2015: Stage IV ([name], NX, M1b) - Signed by Mitchell Alf, MD on 05/24/2015    Oncology History   # 2012- METASTATIC ADENO CA of LEFT LUNG [acinar pattern] s/p MED LN Bx [NEG- EGFR/K-ras/ALK mutation];[name]; [name]; [name], bronchus, and lung ([place])   05/18/2016 Initial Diagnosis    Malignant neoplasm of trachea, bronchus, and lung ([name])       Cancer of hilus of left lung ([name])   07/20/2016 Initial Diagnosis    Cancer of hilus of left lung ([name])       INTERVAL HISTORY:  [name] with above history of metastatic adenocarcinoma the lung currently on maintenance Alimta since June 2012 is here for follow-up prior to his chemotherapy.   He noted to have episode of right flank pain yesterday that lasted for almost one day. Denies any fevers or chills. Results spontaneously.   Denies any pain.Denies any unusual shortness of breath or cough. Denies any easy bruising. Appetite is good. No weight loss. Intermittent swelling in the legs. He continues to take Lasix as needed.   REVIEW OF SYSTEMS:  A complete 10 point review of system is done which is negative except mentioned above/history of present illness.   PAST MEDICAL HISTORY :  Past Medical History:  Diagnosis Date  . Allergy   . Lung cancer ([place])   . Mild valvular heart disease Nov. 2014   per 2 D Echocardiogram done for persistent leg edema, monitored by cardiology  . Peripheral edema     PAST SURGICAL HISTORY :   Past Surgical History:  Procedure Laterality  Date  . ELBOW SURGERY    . Left anterior thoracotomy with biopsy  March 2012  . PORTACATH PLACEMENT  March 2012  . TONSILLECTOMY      FAMILY HISTORY :   Family History  Problem Relation Age of Onset  . Leukemia Father   . Diabetes Mother     SOCIAL HISTORY:   Social History  Substance Use Topics  . Smoking status: Never Smoker  . Smokeless tobacco: Never Used  . Alcohol use 0.0 oz/week     Comment: "social drinker"     ALLERGIES:  is allergic to penicillins.  MEDICATIONS:  Current Outpatient Prescriptions  Medication Sig Dispense Refill  . CIALIS 20 MG tablet TAKE 1/2 TO 1 TABLET BY MOUTH EVERY 2 DAYS AS NEEDED 10 tablet 12  . doxycycline (VIBRA-TABS) 100 MG tablet Take 1 tablet (100 mg total) by mouth 2 (two) times daily. 14 tablet 0  . folic acid (FOLVITE) 1 MG tablet Take 1 tablet (1 mg total) by mouth daily. 30 tablet 6  . furosemide (LASIX) 20 MG tablet Take 1 tablet (20 mg total) by mouth daily. One tablet daily as needed for swelling 90 tablet 1  . meclizine (ANTIVERT) 25 MG tablet Take 1 tablet (25 mg total) by mouth 3 (three) times daily as needed for dizziness. 60 tablet 0  . meloxicam (MOBIC) 7.5 MG tablet     . Multiple Vitamin (MULTIVITAMIN) capsule Take 1 capsule  by mouth daily. Reported on 02/24/2016    . Naproxen-Esomeprazole 375-20 MG TBEC Take by mouth.    . Omega-3 Fatty Acids (FISH OIL) 1000 MG CAPS Take 1 capsule by mouth daily. Reported on 02/24/2016    . ondansetron (ZOFRAN-ODT) 4 MG disintegrating tablet Take 1 tablet (4 mg total) by mouth every 4 (four) hours as needed for nausea or vomiting. 20 tablet 2  . senna (SENOKOT) 8.6 MG tablet Take 1 tablet by mouth daily. Take as needed around time of chemotherapy    . spironolactone (ALDACTONE) 25 MG tablet Take 1 tablet (25 mg total) by mouth daily. 90 tablet 3  . VIMOVO 500-20 MG TBEC     . vitamin B-12 (CYANOCOBALAMIN) 1000 MCG tablet Take 1,000 mcg by mouth daily.     Current Facility-Administered  Medications  Medication Dose Route Frequency Provider Last Rate Last Dose  . Influenza vac split quadrivalent PF (FLUARIX) injection 0.5 mL  0.5 mL Intramuscular Once Mitchell Sickle, MD       Facility-Administered Medications Ordered in Other Visits  Medication Dose Route Frequency Provider Last Rate Last Dose  . sodium chloride 0.9 % injection 10 mL  10 mL Intracatheter PRN Mitchell Alf, MD   10 mL at 05/27/15 1543  . sodium chloride 0.9 % injection 10 mL  10 mL Intracatheter PRN Mitchell Alf, MD   10 mL at 06/17/15 1510  . sodium chloride 0.9 % injection 10 mL  10 mL Intravenous PRN Mitchell Gleason, MD   10 mL at 07/08/15 1340    PHYSICAL EXAMINATION: ECOG PERFORMANCE STATUS: 0 - Asymptomatic  BP 114/75 (BP Location: Left Arm, Patient Position: Sitting)   Pulse 68   Temp 97.1 F (36.2 C) (Tympanic)   Resp 18   Wt 193 lb (87.5 kg)   BMI 27.69 kg/m   Filed Weights   09/21/16 1423  Weight: 193 lb (87.5 kg)     GENERAL: Well-nourished well-developed; Alert, [name]; good dentition  NECK: supple, [name], [name]; 1+ bilateral lower extremity swelling. ABDOMEN:abdomen soft, [name];  LABORATORY DATA:  I have reviewed the data as listed    Component Value Date/Time   NA 130 (L) 09/21/2016 1154   NA 138 12/10/2014 1341   K 3.7 09/21/2016 1154   K 3.6 12/10/2014 1341   CL 94 (L) 09/21/2016 1154   CL 102 12/10/2014 1341   CO2 26 09/21/2016 1154   CO2 30 12/10/2014 1341   GLUCOSE 117  (H) 09/21/2016 1154   GLUCOSE 160 (H) 12/10/2014 1341   BUN 17 09/21/2016 1154   BUN 21 (H) 12/10/2014 1341   CREATININE 0.95 09/21/2016 1154   CREATININE 1.10 03/25/2015 1453   CALCIUM 9.2 09/21/2016 1154   CALCIUM 8.4 (L) 12/10/2014 1341   PROT 7.2 09/21/2016 1154   PROT 6.6 12/10/2014 1341   ALBUMIN 4.2 09/21/2016 1154   ALBUMIN 3.4 12/10/2014 1341   AST 25 09/21/2016 1154   AST 17 12/10/2014 1341   ALT 16 (L) 09/21/2016 1154   ALT 23 12/10/2014  1341   ALKPHOS 39 09/21/2016 1154   ALKPHOS 50 12/10/2014 1341   BILITOT 1.1 09/21/2016 1154   BILITOT 0.5 12/10/2014 1341   GFRNONAA >60 09/21/2016 1154   GFRNONAA >60 03/25/2015 1453   GFRAA >60 09/21/2016 1154   GFRAA >60 03/25/2015 1453    No results found for: SPEP, UPEP  Lab Results  Component Value Date   WBC 4.9 09/21/2016   NEUTROABS 3.0 09/21/2016   HGB 13.7 09/21/2016   HCT 39.6 (L) 09/21/2016   MCV 93.6 09/21/2016   PLT 150 09/21/2016      Chemistry      Component Value Date/Time   NA 130 (L) 09/21/2016 1154   NA 138 12/10/2014 1341   K 3.7 09/21/2016 1154   K 3.6 12/10/2014 1341   CL 94 (L) 09/21/2016 1154   CL 102 12/10/2014 1341   CO2 26 09/21/2016 1154   CO2 30 12/10/2014 1341   BUN 17 09/21/2016 1154   BUN 21 (H) 12/10/2014 1341   CREATININE 0.95 09/21/2016 1154   CREATININE 1.10 03/25/2015 1453      Component Value Date/Time   CALCIUM 9.2 09/21/2016 1154   CALCIUM 8.4 (L) 12/10/2014 1341   ALKPHOS 39 09/21/2016 1154   ALKPHOS 50 12/10/2014 1341   AST 25 09/21/2016 1154   AST 17 12/10/2014 1341   ALT 16 (L) 09/21/2016 1154   ALT 23 12/10/2014 1341   BILITOT 1.1 09/21/2016 1154   BILITOT 0.5 12/10/2014 1341       RADIOGRAPHIC STUDIES: I have personally reviewed the radiological images as listed and agreed with the findings in the report. No results found.   ASSESSMENT & PLAN:  Cancer of hilus of left lung ([place]) Stage IV NED. Patient currently on Alimta maintenance since 2012.  Clinically no evidence of progression.SEP  2017- CT chest abdomen pelvis NED.   # Continue maintenance Alimta. Tolerating well without any major side effects. On folate.   # Right paravertebral pain [~1 day]  Will order UA today.   # Grade 1; continue Lasix when necessary.   # Follow-up with me in 3 weeks' labs chemotherapy.   No orders of the defined types were placed in this encounter.     Mitchell Sickle, MD 09/21/2016 3:01 PM

## 2016-09-21 NOTE — Progress Notes (Signed)
Patient states yesterday he had pain in his right back.  Patient would like to get flu shot today.

## 2016-10-08 ENCOUNTER — Telehealth: Payer: Self-pay | Admitting: *Deleted

## 2016-10-08 NOTE — Telephone Encounter (Signed)
Received t/c from Thea Silversmith this morning. States he is at the dentist to have his teeth cleaned and the hygienist is concerned whether he needs to have this procedure since he is receiving chemotherapy. Spoke with hygienist Tomasa Hosteller states she will perform an "above the gum" cleaning to avoid injury by aggressive cleaning. Question posed to Dr. Rogue Bussing, and he states it is fine for Mitchell Mosley to have his teeth cleaned. T/C back to Mitchell Mosley to inform of Dr. Sharmaine Base instructions.  Yolande Jolly, BSN, MHA, OCN 10/08/2016 8:43 AM

## 2016-10-12 ENCOUNTER — Inpatient Hospital Stay: Payer: BLUE CROSS/BLUE SHIELD

## 2016-10-12 ENCOUNTER — Encounter: Payer: Self-pay | Admitting: *Deleted

## 2016-10-12 ENCOUNTER — Inpatient Hospital Stay: Payer: BLUE CROSS/BLUE SHIELD | Attending: Internal Medicine | Admitting: Internal Medicine

## 2016-10-12 VITALS — BP 115/69 | HR 73 | Temp 96.1°F | Resp 18 | Wt 198.0 lb

## 2016-10-12 DIAGNOSIS — M25531 Pain in right wrist: Secondary | ICD-10-CM | POA: Diagnosis not present

## 2016-10-12 DIAGNOSIS — I712 Thoracic aortic aneurysm, without rupture: Secondary | ICD-10-CM

## 2016-10-12 DIAGNOSIS — Z791 Long term (current) use of non-steroidal anti-inflammatories (NSAID): Secondary | ICD-10-CM | POA: Diagnosis not present

## 2016-10-12 DIAGNOSIS — C3480 Malignant neoplasm of overlapping sites of unspecified bronchus and lung: Principal | ICD-10-CM

## 2016-10-12 DIAGNOSIS — Z79899 Other long term (current) drug therapy: Secondary | ICD-10-CM | POA: Diagnosis not present

## 2016-10-12 DIAGNOSIS — C3402 Malignant neoplasm of left main bronchus: Secondary | ICD-10-CM | POA: Insufficient documentation

## 2016-10-12 DIAGNOSIS — Z5111 Encounter for antineoplastic chemotherapy: Secondary | ICD-10-CM | POA: Diagnosis not present

## 2016-10-12 DIAGNOSIS — R609 Edema, unspecified: Secondary | ICD-10-CM

## 2016-10-12 DIAGNOSIS — C33 Malignant neoplasm of trachea: Secondary | ICD-10-CM

## 2016-10-12 DIAGNOSIS — Z006 Encounter for examination for normal comparison and control in clinical research program: Secondary | ICD-10-CM | POA: Diagnosis not present

## 2016-10-12 DIAGNOSIS — I38 Endocarditis, valve unspecified: Secondary | ICD-10-CM

## 2016-10-12 DIAGNOSIS — C3490 Malignant neoplasm of unspecified part of unspecified bronchus or lung: Secondary | ICD-10-CM

## 2016-10-12 LAB — COMPREHENSIVE METABOLIC PANEL
ALK PHOS: 39 U/L (ref 38–126)
ALT: 16 U/L — ABNORMAL LOW (ref 17–63)
AST: 24 U/L (ref 15–41)
Albumin: 4 g/dL (ref 3.5–5.0)
Anion gap: 7 (ref 5–15)
BILIRUBIN TOTAL: 0.6 mg/dL (ref 0.3–1.2)
BUN: 24 mg/dL — AB (ref 6–20)
CALCIUM: 8.9 mg/dL (ref 8.9–10.3)
CO2: 26 mmol/L (ref 22–32)
CREATININE: 1.06 mg/dL (ref 0.61–1.24)
Chloride: 97 mmol/L — ABNORMAL LOW (ref 101–111)
GFR calc Af Amer: >60 mL/min (ref >60)
Glucose, Bld: 106 mg/dL — ABNORMAL HIGH (ref 65–99)
POTASSIUM: 3.8 mmol/L (ref 3.5–5.1)
Sodium: 130 mmol/L — ABNORMAL LOW (ref 135–145)
TOTAL PROTEIN: 6.9 g/dL (ref 6.5–8.1)

## 2016-10-12 LAB — CBC WITH DIFFERENTIAL/PLATELET
BASOS ABS: 0 10*3/uL (ref 0–0.1)
Basophils Relative: 1 %
Eosinophils Absolute: 0.1 10*3/uL (ref 0–0.7)
Eosinophils Relative: 2 %
HEMATOCRIT: 40.2 % (ref 40.0–52.0)
Hemoglobin: 13.6 g/dL (ref 13.0–18.0)
LYMPHS PCT: 24 %
Lymphs Abs: 1.5 10*3/uL (ref 1.0–3.6)
MCH: 32.1 pg (ref 26.0–34.0)
MCHC: 33.7 g/dL (ref 32.0–36.0)
MCV: 95.3 fL (ref 80.0–100.0)
MONO ABS: 0.7 10*3/uL (ref 0.2–1.0)
MONOS PCT: 11 %
NEUTROS ABS: 3.7 10*3/uL (ref 1.4–6.5)
Neutrophils Relative %: 62 %
Platelets: 147 10*3/uL — ABNORMAL LOW (ref 150–440)
RBC: 4.22 MIL/uL — ABNORMAL LOW (ref 4.40–5.90)
RDW: 13.1 % (ref 11.5–14.5)
WBC: 6 10*3/uL (ref 3.8–10.6)

## 2016-10-12 MED ORDER — SODIUM CHLORIDE 0.9 % IV SOLN
500.0000 mg/m2 | Freq: Once | INTRAVENOUS | Status: AC
  Administered 2016-10-12: 1050 mg via INTRAVENOUS
  Filled 2016-10-12: qty 42

## 2016-10-12 MED ORDER — SODIUM CHLORIDE 0.9 % IJ SOLN
10.0000 mL | INTRAMUSCULAR | Status: DC | PRN
  Administered 2016-10-12: 10 mL
  Filled 2016-10-12: qty 10

## 2016-10-12 MED ORDER — HEPARIN SOD (PORK) LOCK FLUSH 100 UNIT/ML IV SOLN
500.0000 [IU] | Freq: Once | INTRAVENOUS | Status: AC | PRN
  Administered 2016-10-12: 500 [IU]
  Filled 2016-10-12: qty 5

## 2016-10-12 MED ORDER — SODIUM CHLORIDE 0.9 % IV SOLN
Freq: Once | INTRAVENOUS | Status: AC
  Administered 2016-10-12: 15:00:00 via INTRAVENOUS
  Filled 2016-10-12: qty 1000

## 2016-10-12 MED ORDER — CYANOCOBALAMIN 1000 MCG/ML IJ SOLN
1000.0000 ug | Freq: Once | INTRAMUSCULAR | Status: AC
  Administered 2016-10-12: 1000 ug via INTRAMUSCULAR
  Filled 2016-10-12: qty 1

## 2016-10-12 MED ORDER — SODIUM CHLORIDE 0.9 % IV SOLN
Freq: Once | INTRAVENOUS | Status: AC
  Administered 2016-10-12: 15:00:00 via INTRAVENOUS
  Filled 2016-10-12: qty 4

## 2016-10-12 NOTE — Assessment & Plan Note (Signed)
Stage IV NED. Patient currently on Alimta maintenance since 2012. Clinically no evidence of progression.SEP  2017- CT chest abdomen pelvis NED.   # Continue maintenance Alimta. Tolerating well without any major side effects. On folate. Will again need scans in Jan 2018.   # Grade 1; continue Lasix when necessary.   # Follow-up with me in 3 weeks' labs chemotherapy.

## 2016-10-12 NOTE — Progress Notes (Signed)
[place] OFFICE PROGRESS NOTE  Patient Care Team: [name], [name], stage 4 [facility])   Staging form: Lung, AJCC 7th Edition     Clinical stage from 05/24/2015: Stage IV ([place], NX, [name], MD on 05/24/2015    Oncology History   # 2012- METASTATIC ADENO CA of LEFT LUNG [acinar pattern] s/p MED LN Bx [NEG- EGFR/K-ras/ALK mutation];[name]; [name]; [name], bronchus, and lung ([place])   05/18/2016 Initial Diagnosis    Malignant neoplasm of trachea, bronchus, and lung ([place])       Cancer of hilus of left lung ([place])   07/20/2016 Initial Diagnosis    Cancer of hilus of left lung ([place])       INTERVAL HISTORY:  [name] with above history of metastatic adenocarcinoma the lung currently on maintenance Alimta since June 2012 is here for follow-up prior to his chemotherapy.    Denies any pain.Denies any unusual shortness of breath or cough. Denies any easy bruising. Appetite is good. No weight loss. Intermittent swelling in the legs. He continues to take Lasix as needed. No chest pain. Intermittent swelling in the legs for which she is on when necessary Lasix/aldactone.    REVIEW OF SYSTEMS:  A complete 10 point review of system is done which is negative except mentioned above/history of present illness.   PAST MEDICAL HISTORY :  Past Medical History:  Diagnosis Date  . Allergy   . Lung cancer ([place])   . Mild valvular heart disease Nov. 2014   per 2 D Echocardiogram done for persistent leg edema, monitored by cardiology  . Peripheral edema     PAST SURGICAL HISTORY :   Past Surgical History:  Procedure Laterality Date  . ELBOW SURGERY    . Left anterior  thoracotomy with biopsy  March 2012  . PORTACATH PLACEMENT  March 2012  . TONSILLECTOMY      FAMILY HISTORY :   Family History  Problem Relation Age of Onset  . Leukemia Father   . Diabetes Mother     SOCIAL HISTORY:   Social History  Substance Use Topics  . Smoking status: Never Smoker  . Smokeless tobacco: Never Used  . Alcohol use 0.0 oz/week     Comment: "social drinker"     ALLERGIES:  is allergic to penicillins.  MEDICATIONS:  Current Outpatient Prescriptions  Medication Sig Dispense Refill  . CIALIS 20 MG tablet TAKE 1/2 TO 1 TABLET BY MOUTH EVERY 2 DAYS AS NEEDED 10 tablet 12  . doxycycline (VIBRA-TABS) 100 MG tablet Take 1 tablet (100 mg total) by mouth 2 (two) times daily. 14 tablet 0  . folic acid (FOLVITE) 1 MG tablet Take 1 tablet (1 mg total) by mouth daily. 30 tablet 6  . furosemide (LASIX) 20 MG tablet Take 1 tablet (20 mg total) by mouth daily. One tablet daily as needed for swelling 90 tablet 1  . meclizine (ANTIVERT) 25 MG tablet Take 1 tablet (25 mg total) by mouth 3 (three) times daily as needed for dizziness. 60 tablet 0  . meloxicam (MOBIC) 7.5 MG tablet     . Multiple Vitamin (MULTIVITAMIN) capsule Take 1 capsule by mouth daily. Reported on 02/24/2016    .  Naproxen-Esomeprazole 375-20 MG TBEC Take by mouth.    . Omega-3 Fatty Acids (FISH OIL) 1000 MG CAPS Take 1 capsule by mouth daily. Reported on 02/24/2016    . ondansetron (ZOFRAN-ODT) 4 MG disintegrating tablet Take 1 tablet (4 mg total) by mouth every 4 (four) hours as needed for nausea or vomiting. 20 tablet 2  . senna (SENOKOT) 8.6 MG tablet Take 1 tablet by mouth daily. Take as needed around time of chemotherapy    . spironolactone (ALDACTONE) 25 MG tablet Take 1 tablet (25 mg total) by mouth daily. 90 tablet 3  . VIMOVO 500-20 MG TBEC     . vitamin B-12 (CYANOCOBALAMIN) 1000 MCG tablet Take 1,000 mcg by mouth daily.     No current facility-administered medications for this visit.     Facility-Administered Medications Ordered in Other Visits  Medication Dose Route Frequency Provider Last Rate Last Dose  . 0.9 %  sodium chloride infusion   Intravenous Once [name], MD      . heparin lock flush 100 unit/mL  500 Units Intracatheter Once PRN [name], MD      . INV-PEMEtrexed Lilly S130 (ALIMTA) 1,050 mg in sodium chloride 0.9 % 100 mL chemo infusion  500 mg/m2 (Treatment Plan Recorded) Intravenous Once [name], MD      . ondansetron (ZOFRAN) 8 mg, dexamethasone (DECADRON) 10 mg in sodium chloride 0.9 % 50 mL IVPB   Intravenous Once [name], [name], MD   10 mL at 05/27/15 1543  . sodium chloride 0.9 % injection 10 mL  10 mL Intracatheter PRN [name], MD   10 mL at 06/17/15 1510  . sodium chloride 0.9 % injection 10 mL  10 mL Intravenous PRN [name], MD   10 mL at 07/08/15 1340  . sodium chloride 0.9 % injection 10 mL  10 mL Intracatheter PRN [name], MD        PHYSICAL EXAMINATION: ECOG PERFORMANCE STATUS: 0 - Asymptomatic  BP 115/69 (BP Location: Left Arm, Patient Position: Sitting)   Pulse 73   Temp (!) 96.1 F (35.6 C) (Tympanic)   Resp 18   Wt 198 lb (89.8 kg)   BMI 28.41 kg/m   Filed Weights   10/12/16 1409  Weight: 198 lb (89.8 kg)     GENERAL: Well-nourished well-developed; Alert, [name]; good dentition  NECK: supple, [name], [name]; 1+ bilateral lower extremity swelling. ABDOMEN:abdomen soft, non-tender and normal bowel sounds Musculoskeletal:no cyanosis of digits and no clubbing  PSYCH: alert & oriented x 3 with  fluent speech NEURO: no focal motor/sensory deficits SKIN:  no rashes or significant lesions;  LABORATORY DATA:  I have reviewed the data as listed    Component Value Date/Time   NA 130 (L) 10/12/2016 1144   NA 138 12/10/2014 1341   K 3.8 10/12/2016 1144   K 3.6 12/10/2014 1341   CL 97 (L) 10/12/2016 1144   CL 102 12/10/2014 1341   CO2 26 10/12/2016 1144   CO2 30 12/10/2014 1341   GLUCOSE 106 (H) 10/12/2016 1144   GLUCOSE 160 (H)  12/10/2014 1341   BUN 24 (H) 10/12/2016 1144   BUN 21 (H) 12/10/2014 1341   CREATININE 1.06 10/12/2016 1144   CREATININE 1.10 03/25/2015 1453   CALCIUM 8.9 10/12/2016 1144   CALCIUM 8.4 (L) 12/10/2014 1341   PROT 6.9 10/12/2016 1144   PROT 6.6 12/10/2014 1341   ALBUMIN 4.0 10/12/2016 1144   ALBUMIN 3.4 12/10/2014 1341   AST 24 10/12/2016 1144   AST 17 12/10/2014 1341   ALT 16 (L) 10/12/2016 1144   ALT 23 12/10/2014 1341   ALKPHOS 39 10/12/2016 1144   ALKPHOS 50 12/10/2014 1341   BILITOT 0.6 10/12/2016 1144   BILITOT 0.5 12/10/2014 1341   GFRNONAA >60 10/12/2016 1144   GFRNONAA >60 03/25/2015 1453   GFRAA >60 10/12/2016 1144   GFRAA >60 03/25/2015 1453    No results found for: SPEP, UPEP  Lab Results  Component Value Date   WBC 6.0 10/12/2016   NEUTROABS 3.7 10/12/2016   HGB 13.6 10/12/2016   HCT 40.2 10/12/2016   MCV 95.3 10/12/2016   PLT 147 (L) 10/12/2016      Chemistry      Component Value Date/Time   NA 130 (L) 10/12/2016 1144   NA 138 12/10/2014 1341   K 3.8 10/12/2016 1144   K 3.6 12/10/2014 1341   CL 97 (L) 10/12/2016 1144   CL 102 12/10/2014 1341   CO2 26 10/12/2016 1144   CO2 30 12/10/2014 1341   BUN 24 (H) 10/12/2016 1144   BUN 21 (H) 12/10/2014 1341   CREATININE 1.06 10/12/2016 1144   CREATININE 1.10 03/25/2015 1453      Component Value Date/Time   CALCIUM 8.9 10/12/2016 1144   CALCIUM 8.4 (L) 12/10/2014 1341   ALKPHOS 39 10/12/2016 1144   ALKPHOS 50 12/10/2014 1341   AST 24 10/12/2016 1144   AST 17  12/10/2014 1341   ALT 16 (L) 10/12/2016 1144   ALT 23 12/10/2014 1341   BILITOT 0.6 10/12/2016 1144   BILITOT 0.5 12/10/2014 1341       RADIOGRAPHIC STUDIES: I have personally reviewed the radiological images as listed and agreed with the findings in the report. No results found.   ASSESSMENT & PLAN:  Cancer of hilus of left lung ([place]) Stage IV NED. Patient currently on Alimta maintenance since 2012. Clinically no evidence of progression.SEP  2017- CT chest abdomen pelvis NED.   # Continue maintenance Alimta. Tolerating well without any major side effects. On folate. Will again need scans in Jan 2018.   # Grade 1; continue Lasix when necessary.   # Follow-up with me in 3 weeks' labs chemotherapy.   Orders Placed This Encounter  Procedures  . Magnesium    Standing Status:   Future    Standing Expiration Date:   10/12/2017  . Urinalysis complete, with microscopic [facility])    Standing Status:   Future    Standing Expiration Date:   10/12/2017      [name], MD 10/12/2016 3:00 PM

## 2016-10-12 NOTE — Progress Notes (Signed)
  Mitchell Mosley returns to clinic today for consideration of cycle #594 Alimta infusion and vitamin B12 injection on the [employer] study. Patient reports that he is doing well and denies any further right flank pain since his last clinic visit. VS and weight remain stable.  Patient reports his wrist pain remains improved but has not yet resolved and that his orthopedic MD told him to continue with the same treatment and if his tendonitis worsens, he will evaluate him for a steroid injection at that time. Trace of edema in BLE is unchanged, and Mitchell Mosley continues to take Lasix for this as needed. Discussed again with patient that this may be the reason that his sodium level remains a little low. Sodium level is '130mg'$ /dL (still grade 1 hyponatremia). Platelet count dropped just under normal to 147,000. CBC and chemistries otherwise wnl for patient this cycle. Mitchell Mosley reports fatigue level is unchanged and occurs 1-2 days following his Alimta for 1-5 days. States he does not usually experience as much fatigue following treatments that he receives a B12 injection - like today. Dr. Rogue Bussing in to examine patient and approves his treatment. Plan to proceed with Cycle 594 of Alimta as scheduled today with no dose modifications. Adverse events with grade and attribution as follows:  Adverse Event Log  Study/Protocol: Eli Lilly S130 Cycle: #593 (assessed for #591)  Event Grade Onset Date Resolved Date Drug Name Pemetrexed Attribution Treatment Comments  Edema (mild) Grade1 05/30/12 ongoing  Possible None 1+pitting includes calf  Fatigue Grade 1 02/03/16 ongoing  Possible None   Hyponatremia Grade 1 07/20/16   Possible None   Tendonitis Grade 1 09/21/16 ongoing  Unrelated Anti-inflammatory drug Vimovo Improved per patient  Low platelet count Grade 1 10/12/16   Possible None Counts decreased to 147,000         Yolande Jolly, BSN, MHA, OCN 10/12/2016 2:43 PM

## 2016-10-12 NOTE — Progress Notes (Signed)
Patient is here for follow up  

## 2016-10-22 DIAGNOSIS — M9901 Segmental and somatic dysfunction of cervical region: Secondary | ICD-10-CM | POA: Diagnosis not present

## 2016-10-22 DIAGNOSIS — M9902 Segmental and somatic dysfunction of thoracic region: Secondary | ICD-10-CM | POA: Diagnosis not present

## 2016-10-22 DIAGNOSIS — M542 Cervicalgia: Secondary | ICD-10-CM | POA: Diagnosis not present

## 2016-10-22 DIAGNOSIS — M546 Pain in thoracic spine: Secondary | ICD-10-CM | POA: Diagnosis not present

## 2016-10-24 DIAGNOSIS — M9901 Segmental and somatic dysfunction of cervical region: Secondary | ICD-10-CM | POA: Diagnosis not present

## 2016-10-24 DIAGNOSIS — M9902 Segmental and somatic dysfunction of thoracic region: Secondary | ICD-10-CM | POA: Diagnosis not present

## 2016-10-24 DIAGNOSIS — M546 Pain in thoracic spine: Secondary | ICD-10-CM | POA: Diagnosis not present

## 2016-10-24 DIAGNOSIS — M542 Cervicalgia: Secondary | ICD-10-CM | POA: Diagnosis not present

## 2016-10-29 ENCOUNTER — Telehealth: Payer: Self-pay | Admitting: *Deleted

## 2016-10-29 DIAGNOSIS — M546 Pain in thoracic spine: Secondary | ICD-10-CM | POA: Diagnosis not present

## 2016-10-29 DIAGNOSIS — M9901 Segmental and somatic dysfunction of cervical region: Secondary | ICD-10-CM | POA: Diagnosis not present

## 2016-10-29 DIAGNOSIS — M542 Cervicalgia: Secondary | ICD-10-CM | POA: Diagnosis not present

## 2016-10-29 DIAGNOSIS — M9902 Segmental and somatic dysfunction of thoracic region: Secondary | ICD-10-CM | POA: Diagnosis not present

## 2016-10-29 NOTE — Telephone Encounter (Signed)
Received t/c from [name] this morning requesting that his appointment date be changed from Friday - 11/02/16 to either Wednesday - 10/31/16 or Monday - 11/05/16. States his son has an Special educational needs teacher that day and he will not be able to get his treatment on that afternoon. Informed Heather and Tia on Dr. Aletha Halim team and they state Wednesday would be better for them. Tia will make the appointment and contact Mr. Gritz with the date and time. Yolande Jolly, BSN, MHA, OCN 10/29/2016  11:09 AM

## 2016-10-31 ENCOUNTER — Inpatient Hospital Stay (HOSPITAL_BASED_OUTPATIENT_CLINIC_OR_DEPARTMENT_OTHER): Payer: BLUE CROSS/BLUE SHIELD | Admitting: Internal Medicine

## 2016-10-31 ENCOUNTER — Inpatient Hospital Stay: Payer: BLUE CROSS/BLUE SHIELD

## 2016-10-31 ENCOUNTER — Encounter: Payer: Self-pay | Admitting: *Deleted

## 2016-10-31 DIAGNOSIS — M25531 Pain in right wrist: Secondary | ICD-10-CM

## 2016-10-31 DIAGNOSIS — R609 Edema, unspecified: Secondary | ICD-10-CM | POA: Diagnosis not present

## 2016-10-31 DIAGNOSIS — M9901 Segmental and somatic dysfunction of cervical region: Secondary | ICD-10-CM | POA: Diagnosis not present

## 2016-10-31 DIAGNOSIS — C3402 Malignant neoplasm of left main bronchus: Secondary | ICD-10-CM

## 2016-10-31 DIAGNOSIS — I38 Endocarditis, valve unspecified: Secondary | ICD-10-CM | POA: Diagnosis not present

## 2016-10-31 DIAGNOSIS — M546 Pain in thoracic spine: Secondary | ICD-10-CM | POA: Diagnosis not present

## 2016-10-31 DIAGNOSIS — Z79899 Other long term (current) drug therapy: Secondary | ICD-10-CM

## 2016-10-31 DIAGNOSIS — M542 Cervicalgia: Secondary | ICD-10-CM | POA: Diagnosis not present

## 2016-10-31 DIAGNOSIS — Z791 Long term (current) use of non-steroidal anti-inflammatories (NSAID): Secondary | ICD-10-CM

## 2016-10-31 DIAGNOSIS — I712 Thoracic aortic aneurysm, without rupture: Secondary | ICD-10-CM | POA: Diagnosis not present

## 2016-10-31 DIAGNOSIS — Z006 Encounter for examination for normal comparison and control in clinical research program: Secondary | ICD-10-CM | POA: Diagnosis not present

## 2016-10-31 DIAGNOSIS — Z5111 Encounter for antineoplastic chemotherapy: Secondary | ICD-10-CM | POA: Diagnosis not present

## 2016-10-31 DIAGNOSIS — C33 Malignant neoplasm of trachea: Secondary | ICD-10-CM

## 2016-10-31 DIAGNOSIS — C3480 Malignant neoplasm of overlapping sites of unspecified bronchus and lung: Principal | ICD-10-CM

## 2016-10-31 DIAGNOSIS — M9902 Segmental and somatic dysfunction of thoracic region: Secondary | ICD-10-CM | POA: Diagnosis not present

## 2016-10-31 LAB — URINALYSIS COMPLETE WITH MICROSCOPIC (ARMC ONLY)
Bacteria, UA: NONE SEEN
Bilirubin Urine: NEGATIVE
GLUCOSE, UA: NEGATIVE mg/dL
HGB URINE DIPSTICK: NEGATIVE
KETONES UR: NEGATIVE mg/dL
Leukocytes, UA: NEGATIVE
NITRITE: NEGATIVE
PROTEIN: NEGATIVE mg/dL
RBC / HPF: NONE SEEN RBC/hpf (ref 0–5)
SPECIFIC GRAVITY, URINE: 1.004 — AB (ref 1.005–1.030)
Squamous Epithelial / LPF: NONE SEEN
WBC, UA: NONE SEEN WBC/hpf (ref 0–5)
pH: 6 (ref 5.0–8.0)

## 2016-10-31 LAB — COMPREHENSIVE METABOLIC PANEL
ALT: 17 U/L (ref 17–63)
ANION GAP: 9 (ref 5–15)
AST: 25 U/L (ref 15–41)
Albumin: 4.1 g/dL (ref 3.5–5.0)
Alkaline Phosphatase: 37 U/L — ABNORMAL LOW (ref 38–126)
BUN: 31 mg/dL — ABNORMAL HIGH (ref 6–20)
CHLORIDE: 97 mmol/L — AB (ref 101–111)
CO2: 25 mmol/L (ref 22–32)
CREATININE: 1.04 mg/dL (ref 0.61–1.24)
Calcium: 9.1 mg/dL (ref 8.9–10.3)
Glucose, Bld: 158 mg/dL — ABNORMAL HIGH (ref 65–99)
Potassium: 3.8 mmol/L (ref 3.5–5.1)
Sodium: 131 mmol/L — ABNORMAL LOW (ref 135–145)
Total Bilirubin: 0.6 mg/dL (ref 0.3–1.2)
Total Protein: 7 g/dL (ref 6.5–8.1)

## 2016-10-31 LAB — CBC WITH DIFFERENTIAL/PLATELET
Basophils Absolute: 0.1 10*3/uL (ref 0–0.1)
Basophils Relative: 2 %
EOS ABS: 0.1 10*3/uL (ref 0–0.7)
EOS PCT: 2 %
HCT: 40.3 % (ref 40.0–52.0)
Hemoglobin: 13.9 g/dL (ref 13.0–18.0)
LYMPHS ABS: 1.3 10*3/uL (ref 1.0–3.6)
LYMPHS PCT: 21 %
MCH: 32.1 pg (ref 26.0–34.0)
MCHC: 34.4 g/dL (ref 32.0–36.0)
MCV: 93.3 fL (ref 80.0–100.0)
MONO ABS: 0.5 10*3/uL (ref 0.2–1.0)
MONOS PCT: 8 %
Neutro Abs: 4.3 10*3/uL (ref 1.4–6.5)
Neutrophils Relative %: 67 %
PLATELETS: 146 10*3/uL — AB (ref 150–440)
RBC: 4.32 MIL/uL — AB (ref 4.40–5.90)
RDW: 13.3 % (ref 11.5–14.5)
WBC: 6.4 10*3/uL (ref 3.8–10.6)

## 2016-10-31 LAB — MAGNESIUM: MAGNESIUM: 1.8 mg/dL (ref 1.7–2.4)

## 2016-10-31 MED ORDER — HEPARIN SOD (PORK) LOCK FLUSH 100 UNIT/ML IV SOLN
500.0000 [IU] | Freq: Once | INTRAVENOUS | Status: AC | PRN
  Administered 2016-10-31: 500 [IU]

## 2016-10-31 MED ORDER — SODIUM CHLORIDE 0.9 % IV SOLN
Freq: Once | INTRAVENOUS | Status: AC
  Administered 2016-10-31: 14:00:00 via INTRAVENOUS
  Filled 2016-10-31: qty 1000

## 2016-10-31 MED ORDER — SODIUM CHLORIDE 0.9 % IV SOLN
500.0000 mg/m2 | Freq: Once | INTRAVENOUS | Status: AC
  Administered 2016-10-31: 1050 mg via INTRAVENOUS
  Filled 2016-10-31: qty 42

## 2016-10-31 MED ORDER — DEXAMETHASONE SODIUM PHOSPHATE 100 MG/10ML IJ SOLN
Freq: Once | INTRAMUSCULAR | Status: AC
  Administered 2016-10-31: 15:00:00 via INTRAVENOUS
  Filled 2016-10-31: qty 4

## 2016-10-31 MED ORDER — SODIUM CHLORIDE 0.9 % IJ SOLN
10.0000 mL | INTRAMUSCULAR | Status: DC | PRN
  Administered 2016-10-31: 10 mL
  Filled 2016-10-31: qty 10

## 2016-10-31 NOTE — Progress Notes (Signed)
Patient here today for follow up on Cancer of hilus of left lung.  Patient has no concerns today

## 2016-10-31 NOTE — Progress Notes (Signed)
  Mitchell Mosley returns to clinic today for consideration of cycle #595 Alimta infusion on the [employer] research study. He continues to report that he is doing well and denies any further right flank pain since his last clinic visit. VS remain stable. Weight is unchanged since last visit.  Patient reports his wrist pain is about the same as before - not yet completely resolved. Trace of edema in BLE is unchanged, and Mr. Mallozzi continues to take prn Lasix for this. Sodium level remains a little low at '131mg'$ /dL, which is a grade 1 hyponatremia. Platelets also a little low at 146,000. CBC and chemistries including magnesium are otherwise wnl for patient this cycle. Mr. Kail reports fatigue level is unchanged and occurs 1-2 days following his Alimta for 1-5 days.  Dr. Rogue Bussing in to examine patient and has signed his treatment orders. Patient reports to Dr. Rogue Bussing that he has been seeing a chiropractor, who suggested that he have a detox soak to help with the swelling he experiences in his hands and ble, but wanted to make sure this would not interfere with his chemotherapy. Dr. B states this will be OK. Will follow up with patient next visit to see whether this improved his mild edema. Plan to proceed with Cycle 595 of Alimta as scheduled today with no dose modifications. Adverse events with grade and attribution as follows:  Adverse Event Log  Study/Protocol: Eli Lilly S130 Cycle: #595 (assessed for #594)  Event Grade Onset Date Resolved Date Drug Name Pemetrexed Attribution Treatment Comments  Edema (mild) Grade 1 05/30/12 ongoing  Possible None Trace in ble to calf  Fatigue Grade 1 02/03/16 ongoing  Possible None   Hyponatremia Grade 1 07/20/16   Possible None   Tendonitis Grade 1 09/21/16 ongoing  Unrelated Anti-inflammatory drug Vimovo Unchanged per patient  Low platelet count Grade 1 10/12/16   Possible None Counts decreased to 146,000         Yolande Jolly, BSN,  MHA, OCN 10/31/2016 2:16 PM

## 2016-10-31 NOTE — Progress Notes (Signed)
[place] OFFICE PROGRESS NOTE  Patient Care Team: [name], [name], stage 4 [facility])   Staging form: Lung, AJCC 7th Edition     Clinical stage from 05/24/2015: Stage IV ([place], NX, [name], MD on 05/24/2015    Oncology History   # 2012- METASTATIC ADENO CA of LEFT LUNG [acinar pattern] s/p MED LN Bx [NEG- EGFR/K-ras/ALK mutation];[name]; [name]; [name], bronchus, and lung ([place])   05/18/2016 Initial Diagnosis    Malignant neoplasm of trachea, bronchus, and lung ([place])       Cancer of hilus of left lung ([place])   07/20/2016 Initial Diagnosis    Cancer of hilus of left lung ([place])       INTERVAL HISTORY:  [name] with above history of metastatic adenocarcinoma the lung currently on maintenance Alimta since June 2012 is here for follow-up prior to his chemotherapy.   He has mild pain in his right wrist; on NSAIDs. Otherwise denies any unusual shortness of breath or cough.Appetite is good. No weight loss. No chest pain. Intermittent swelling in the legs for which he is on when necessary Lasix/aldactone.    REVIEW OF SYSTEMS:  A complete 10 point review of system is done which is negative except mentioned above/history of present illness.   PAST MEDICAL HISTORY :  Past Medical History:  Diagnosis Date  . Allergy   . Lung cancer ([place])   . Mild valvular heart disease Nov. 2014   per 2 D Echocardiogram done for persistent leg edema, monitored by cardiology  . Peripheral edema     PAST SURGICAL HISTORY :   Past Surgical History:  Procedure Laterality Date  . ELBOW SURGERY    . Left anterior thoracotomy with biopsy  March 2012  . PORTACATH PLACEMENT   March 2012  . TONSILLECTOMY      FAMILY HISTORY :   Family History  Problem Relation Age of Onset  . Leukemia Father   . Diabetes Mother     SOCIAL HISTORY:   Social History  Substance Use Topics  . Smoking status: Never Smoker  . Smokeless tobacco: Never Used  . Alcohol use 0.0 oz/week     Comment: "social drinker"     ALLERGIES:  is allergic to penicillins.  MEDICATIONS:  Current Outpatient Prescriptions  Medication Sig Dispense Refill  . CIALIS 20 MG tablet TAKE 1/2 TO 1 TABLET BY MOUTH EVERY 2 DAYS AS NEEDED 10 tablet 12  . doxycycline (VIBRA-TABS) 100 MG tablet Take 1 tablet (100 mg total) by mouth 2 (two) times daily. 14 tablet 0  . folic acid (FOLVITE) 1 MG tablet Take 1 tablet (1 mg total) by mouth daily. 30 tablet 6  . furosemide (LASIX) 20 MG tablet Take 1 tablet (20 mg total) by mouth daily. One tablet daily as needed for swelling 90 tablet 1  . meclizine (ANTIVERT) 25 MG tablet Take 1 tablet (25 mg total) by mouth 3 (three) times daily as needed for dizziness. 60 tablet 0  . meloxicam (MOBIC) 7.5 MG tablet     . Multiple Vitamin (MULTIVITAMIN) capsule Take 1 capsule by mouth daily. Reported on 02/24/2016    . Naproxen-Esomeprazole 375-20 MG TBEC Take by  mouth.    . Omega-3 Fatty Acids (FISH OIL) 1000 MG CAPS Take 1 capsule by mouth daily. Reported on 02/24/2016    . ondansetron (ZOFRAN-ODT) 4 MG disintegrating tablet Take 1 tablet (4 mg total) by mouth every 4 (four) hours as needed for nausea or vomiting. 20 tablet 2  . senna (SENOKOT) 8.6 MG tablet Take 1 tablet by mouth daily. Take as needed around time of chemotherapy    . spironolactone (ALDACTONE) 25 MG tablet Take 1 tablet (25 mg total) by mouth daily. 90 tablet 3  . VIMOVO 500-20 MG TBEC     . vitamin B-12 (CYANOCOBALAMIN) 1000 MCG tablet Take 1,000 mcg by mouth daily.     No current facility-administered medications for this visit.    Facility-Administered Medications Ordered in Other Visits  Medication  Dose Route Frequency Provider Last Rate Last Dose  . sodium chloride 0.9 % injection 10 mL  10 mL Intracatheter PRN [name], MD   10 mL at 05/27/15 1543  . sodium chloride 0.9 % injection 10 mL  10 mL Intracatheter PRN [name], MD   10 mL at 06/17/15 1510  . sodium chloride 0.9 % injection 10 mL  10 mL Intravenous PRN [name], MD   10 mL at 07/08/15 1340  . sodium chloride 0.9 % injection 10 mL  10 mL Intracatheter PRN [name], MD   10 mL at 10/31/16 1345    PHYSICAL EXAMINATION: ECOG PERFORMANCE STATUS: 0 - Asymptomatic  BP 130/80 (BP Location: Right Arm, Patient Position: Sitting)   Pulse 78   Temp (!) 96.5 F (35.8 C) (Tympanic)   Wt 198 lb (89.8 kg)   BMI 28.41 kg/m   Filed Weights   10/31/16 1404  Weight: 198 lb (89.8 kg)     GENERAL: Well-nourished well-developed; Alert, [name]; good dentition  NECK: supple, [name], [name]; 1+ bilateral lower extremity swelling. ABDOMEN:abdomen soft, [name];  LABORATORY DATA:  I have reviewed the data as listed    Component Value Date/Time   NA 131 (L) 10/31/2016 1330   NA 138 12/10/2014 1341   K 3.8 10/31/2016 1330   K 3.6 12/10/2014 1341   CL 97 (L) 10/31/2016 1330   CL 102 12/10/2014 1341   CO2 25 10/31/2016 1330   CO2 30 12/10/2014 1341   GLUCOSE 158 (H) 10/31/2016 1330   GLUCOSE 160 (H) 12/10/2014 1341   BUN 31 (H) 10/31/2016 1330   BUN 21 (H) 12/10/2014 1341   CREATININE 1.04 10/31/2016 1330    CREATININE 1.10 03/25/2015 1453   CALCIUM 9.1 10/31/2016 1330   CALCIUM 8.4 (L) 12/10/2014 1341   PROT 7.0 10/31/2016 1330   PROT 6.6 12/10/2014 1341   ALBUMIN 4.1 10/31/2016 1330   ALBUMIN 3.4 12/10/2014 1341   AST 25 10/31/2016 1330   AST 17 12/10/2014 1341   ALT 17 10/31/2016 1330   ALT 23 12/10/2014 1341   ALKPHOS 37 (L) 10/31/2016 1330   ALKPHOS 50 12/10/2014 1341   BILITOT 0.6 10/31/2016 1330   BILITOT  0.5 12/10/2014 1341   GFRNONAA >60 10/31/2016 1330   GFRNONAA >60 03/25/2015 1453   GFRAA >60 10/31/2016 1330   GFRAA >60 03/25/2015 1453    No results found for: SPEP, UPEP  Lab Results  Component Value Date   WBC 6.4 10/31/2016   NEUTROABS 4.3 10/31/2016   HGB 13.9 10/31/2016   HCT 40.3 10/31/2016   MCV 93.3 10/31/2016   PLT 146 (L) 10/31/2016      Chemistry      Component Value Date/Time   NA 131 (L) 10/31/2016 1330   NA 138 12/10/2014 1341   K 3.8 10/31/2016 1330   K 3.6 12/10/2014 1341   CL 97 (L) 10/31/2016 1330   CL 102 12/10/2014 1341   CO2 25 10/31/2016 1330   CO2 30 12/10/2014 1341   BUN 31 (H) 10/31/2016 1330   BUN 21 (H) 12/10/2014 1341   CREATININE 1.04 10/31/2016 1330   CREATININE 1.10 03/25/2015 1453      Component Value Date/Time   CALCIUM 9.1 10/31/2016 1330   CALCIUM 8.4 (L) 12/10/2014 1341   ALKPHOS 37 (L) 10/31/2016 1330   ALKPHOS 50 12/10/2014 1341   AST 25 10/31/2016 1330   AST 17 12/10/2014 1341   ALT 17 10/31/2016 1330   ALT 23 12/10/2014 1341   BILITOT 0.6 10/31/2016 1330   BILITOT 0.5 12/10/2014 1341       RADIOGRAPHIC STUDIES: I have personally reviewed the radiological images as listed and agreed with the findings in the report. No results found.   ASSESSMENT & PLAN:  Cancer of hilus of left lung ([place]) Stage IV NED. Patient currently on Alimta maintenance since 2012. Clinically no evidence of progression.SEP  2017- CT chest abdomen pelvis NED.   # Continue maintenance Alimta. Tolerating well without any major side  effects. On folate. Will again need scans in Jan 2018.   # Grade 1; continue Lasix when necessary.   # right wrist pain/ as per chiroproctor prn.   # Follow-up with me in 3 weeks' labs chemotherapy/ dec 20th.   No orders of the defined types were placed in this encounter.     [name], MD 10/31/2016 4:36 PM

## 2016-10-31 NOTE — Assessment & Plan Note (Addendum)
Stage IV NED. Patient currently on Alimta maintenance since 2012. Clinically no evidence of progression.SEP  2017- CT chest abdomen pelvis NED.   # Continue maintenance Alimta. Tolerating well without any major side effects. On folate. Will again need scans in Jan 2018.   # Grade 1; continue Lasix when necessary.   # right wrist pain/ as per chiroproctor prn.   # Follow-up with me in 3 weeks' labs chemotherapy/ dec 20th.

## 2016-11-02 ENCOUNTER — Inpatient Hospital Stay: Payer: BLUE CROSS/BLUE SHIELD | Admitting: Internal Medicine

## 2016-11-02 ENCOUNTER — Inpatient Hospital Stay: Payer: BLUE CROSS/BLUE SHIELD

## 2016-11-02 DIAGNOSIS — M542 Cervicalgia: Secondary | ICD-10-CM | POA: Diagnosis not present

## 2016-11-02 DIAGNOSIS — M9902 Segmental and somatic dysfunction of thoracic region: Secondary | ICD-10-CM | POA: Diagnosis not present

## 2016-11-02 DIAGNOSIS — M9901 Segmental and somatic dysfunction of cervical region: Secondary | ICD-10-CM | POA: Diagnosis not present

## 2016-11-02 DIAGNOSIS — M546 Pain in thoracic spine: Secondary | ICD-10-CM | POA: Diagnosis not present

## 2016-11-05 DIAGNOSIS — M9901 Segmental and somatic dysfunction of cervical region: Secondary | ICD-10-CM | POA: Diagnosis not present

## 2016-11-05 DIAGNOSIS — M542 Cervicalgia: Secondary | ICD-10-CM | POA: Diagnosis not present

## 2016-11-05 DIAGNOSIS — M9902 Segmental and somatic dysfunction of thoracic region: Secondary | ICD-10-CM | POA: Diagnosis not present

## 2016-11-05 DIAGNOSIS — M546 Pain in thoracic spine: Secondary | ICD-10-CM | POA: Diagnosis not present

## 2016-11-16 ENCOUNTER — Other Ambulatory Visit: Payer: Self-pay | Admitting: Internal Medicine

## 2016-11-21 ENCOUNTER — Inpatient Hospital Stay: Payer: BLUE CROSS/BLUE SHIELD | Attending: Internal Medicine

## 2016-11-21 ENCOUNTER — Inpatient Hospital Stay: Payer: BLUE CROSS/BLUE SHIELD

## 2016-11-21 ENCOUNTER — Inpatient Hospital Stay (HOSPITAL_BASED_OUTPATIENT_CLINIC_OR_DEPARTMENT_OTHER): Payer: BLUE CROSS/BLUE SHIELD | Admitting: Internal Medicine

## 2016-11-21 VITALS — BP 108/71 | HR 65 | Temp 96.4°F | Resp 18 | Wt 199.5 lb

## 2016-11-21 DIAGNOSIS — Z006 Encounter for examination for normal comparison and control in clinical research program: Secondary | ICD-10-CM | POA: Insufficient documentation

## 2016-11-21 DIAGNOSIS — I712 Thoracic aortic aneurysm, without rupture: Secondary | ICD-10-CM

## 2016-11-21 DIAGNOSIS — Z79899 Other long term (current) drug therapy: Secondary | ICD-10-CM | POA: Diagnosis not present

## 2016-11-21 DIAGNOSIS — C3402 Malignant neoplasm of left main bronchus: Secondary | ICD-10-CM

## 2016-11-21 DIAGNOSIS — R600 Localized edema: Secondary | ICD-10-CM

## 2016-11-21 DIAGNOSIS — I38 Endocarditis, valve unspecified: Secondary | ICD-10-CM

## 2016-11-21 DIAGNOSIS — C3490 Malignant neoplasm of unspecified part of unspecified bronchus or lung: Secondary | ICD-10-CM

## 2016-11-21 DIAGNOSIS — Z5111 Encounter for antineoplastic chemotherapy: Secondary | ICD-10-CM | POA: Diagnosis not present

## 2016-11-21 DIAGNOSIS — C3480 Malignant neoplasm of overlapping sites of unspecified bronchus and lung: Principal | ICD-10-CM

## 2016-11-21 DIAGNOSIS — C33 Malignant neoplasm of trachea: Secondary | ICD-10-CM

## 2016-11-21 LAB — CBC WITH DIFFERENTIAL/PLATELET
BASOS ABS: 0 10*3/uL (ref 0–0.1)
BASOS PCT: 1 %
Eosinophils Absolute: 0.1 10*3/uL (ref 0–0.7)
Eosinophils Relative: 3 %
HEMATOCRIT: 39 % — AB (ref 40.0–52.0)
Hemoglobin: 13.3 g/dL (ref 13.0–18.0)
Lymphocytes Relative: 32 %
Lymphs Abs: 1.3 10*3/uL (ref 1.0–3.6)
MCH: 31.9 pg (ref 26.0–34.0)
MCHC: 34.2 g/dL (ref 32.0–36.0)
MCV: 93.3 fL (ref 80.0–100.0)
MONO ABS: 0.5 10*3/uL (ref 0.2–1.0)
Monocytes Relative: 12 %
NEUTROS ABS: 2.1 10*3/uL (ref 1.4–6.5)
Neutrophils Relative %: 52 %
PLATELETS: 144 10*3/uL — AB (ref 150–440)
RBC: 4.18 MIL/uL — AB (ref 4.40–5.90)
RDW: 13.5 % (ref 11.5–14.5)
WBC: 4.1 10*3/uL (ref 3.8–10.6)

## 2016-11-21 LAB — COMPREHENSIVE METABOLIC PANEL
ALBUMIN: 4 g/dL (ref 3.5–5.0)
ALT: 15 U/L — AB (ref 17–63)
AST: 24 U/L (ref 15–41)
Alkaline Phosphatase: 37 U/L — ABNORMAL LOW (ref 38–126)
Anion gap: 4 — ABNORMAL LOW (ref 5–15)
BILIRUBIN TOTAL: 0.9 mg/dL (ref 0.3–1.2)
BUN: 13 mg/dL (ref 6–20)
CHLORIDE: 99 mmol/L — AB (ref 101–111)
CO2: 29 mmol/L (ref 22–32)
Calcium: 9 mg/dL (ref 8.9–10.3)
Creatinine, Ser: 0.96 mg/dL (ref 0.61–1.24)
GFR calc Af Amer: >60 mL/min (ref >60)
GFR calc non Af Amer: >60 mL/min (ref >60)
GLUCOSE: 102 mg/dL — AB (ref 65–99)
POTASSIUM: 3.9 mmol/L (ref 3.5–5.1)
Sodium: 132 mmol/L — ABNORMAL LOW (ref 135–145)
Total Protein: 6.8 g/dL (ref 6.5–8.1)

## 2016-11-21 MED ORDER — SODIUM CHLORIDE 0.9 % IJ SOLN
10.0000 mL | INTRAMUSCULAR | Status: DC | PRN
  Filled 2016-11-21: qty 10

## 2016-11-21 MED ORDER — SODIUM CHLORIDE 0.9 % IV SOLN
500.0000 mg/m2 | Freq: Once | INTRAVENOUS | Status: AC
  Administered 2016-11-21: 1050 mg via INTRAVENOUS
  Filled 2016-11-21: qty 42

## 2016-11-21 MED ORDER — SODIUM CHLORIDE 0.9 % IV SOLN
Freq: Once | INTRAVENOUS | Status: AC
  Administered 2016-11-21: 15:00:00 via INTRAVENOUS
  Filled 2016-11-21: qty 1000

## 2016-11-21 MED ORDER — HEPARIN SOD (PORK) LOCK FLUSH 100 UNIT/ML IV SOLN
500.0000 [IU] | Freq: Once | INTRAVENOUS | Status: AC | PRN
  Administered 2016-11-21: 500 [IU]
  Filled 2016-11-21: qty 5

## 2016-11-21 MED ORDER — DEXAMETHASONE SODIUM PHOSPHATE 100 MG/10ML IJ SOLN
Freq: Once | INTRAMUSCULAR | Status: AC
  Administered 2016-11-21: 15:00:00 via INTRAVENOUS
  Filled 2016-11-21: qty 4

## 2016-11-21 NOTE — Progress Notes (Signed)
The patient returns to clinic today for consideration of cycle #596 Alimta infusion on the [employer] research study. He continues to report that he is doing well and denies any further right flank pain. His weight is stable at 90.5 kg (199 lbs). VS are also stable. He continues to have some wrist pain and questioned if study would consider paying for massage therapy. Currently, he is getting chiropractic treatment as needed for tendonitis. Dr. Rogue Bussing in to examine the patient and review labs.  He continues to have trace edema BLE and is unchanged and he continues to take Lasix and Spironolactone daily for edema. Sodium remains slightly low at '132mg'$ /dL, which is a grade 1 hyponatremia. Platelets also slightly low at 144,000. CBC and chemistries are within acceptable parameters to receive treatment today per Dr. Rogue Bussing.  The patient was reminded of CT scan appointment on 12/10/2016 and was also scheduled for return appointment on 12/14/2016 to see MD, review CT results, labs and Alimta. Adverse events with grade and attribution as follows:  Mirian Mo, RN, BSN 11/21/2016 2:45 PM       Adverse Event Log  Study/Protocol: Eli Lilly S130 Cycle: #595 (assessed for #594)  Event Grade Onset Date Resolved Date Drug Name Pemetrexed Attribution Treatment Comments  Edema (mild) Grade 1 05/30/12 ongoing  Possible None Trace in ble to calf  Fatigue Grade 1 02/03/16 ongoing  Possible None   Hyponatremia Grade 1 07/20/16 ongoing  Possible None   Tendonitis Grade 1 09/21/16 ongoing  Unrelated Anti-inflammatory drug Vimovo Unchanged per patient  Low platelet count Grade 1 10/12/16 ongoing  Possible None Counts decreased to 144,000        Mirian Mo, RN, BSN 11/21/2016 2:48 PM

## 2016-11-21 NOTE — Progress Notes (Signed)
[place] OFFICE PROGRESS NOTE  Patient Care Team: [name], [name], stage 4 [facility])   Staging form: Lung, AJCC 7th Edition     Clinical stage from 05/24/2015: Stage IV ([place], NX, [name], MD on 05/24/2015    Oncology History   # 2012- METASTATIC ADENO CA of LEFT LUNG [acinar pattern] s/p MED LN Bx [NEG- EGFR/K-ras/ALK mutation];[name]; [name]; [name], bronchus, and lung ([place])   05/18/2016 Initial Diagnosis    Malignant neoplasm of trachea, bronchus, and lung ([place])       Cancer of hilus of left lung ([place])   07/20/2016 Initial Diagnosis    Cancer of hilus of left lung ([place])       INTERVAL HISTORY:  [name] with above history of metastatic adenocarcinoma the lung currently on maintenance Alimta since June 2012 is here for follow-up prior to his chemotherapy.   He denies any unusual shortness of breath or cough.Appetite is good. No weight loss. No chest pain. Intermittent swelling in the legs for Which he takes daily Lasix/aldactone.    REVIEW OF SYSTEMS:  A complete 10 point review of system is done which is negative except mentioned above/history of present illness.   PAST MEDICAL HISTORY :  Past Medical History:  Diagnosis Date  . Allergy   . Lung cancer ([place])   . Mild valvular heart disease Nov. 2014   per 2 D Echocardiogram done for persistent leg edema, monitored by cardiology  . Peripheral edema     PAST SURGICAL HISTORY :   Past Surgical History:  Procedure Laterality Date  . ELBOW SURGERY    . Left anterior thoracotomy with biopsy  March 2012  . PORTACATH PLACEMENT  March 2012  . TONSILLECTOMY      FAMILY HISTORY :    Family History  Problem Relation Age of Onset  . Leukemia Father   . Diabetes Mother     SOCIAL HISTORY:   Social History  Substance Use Topics  . Smoking status: Never Smoker  . Smokeless tobacco: Never Used  . Alcohol use 0.0 oz/week     Comment: "social drinker"     ALLERGIES:  is allergic to penicillins.  MEDICATIONS:  Current Outpatient Prescriptions  Medication Sig Dispense Refill  . CIALIS 20 MG tablet TAKE 1/2 TO 1 TABLET BY MOUTH EVERY 2 DAYS AS NEEDED 10 tablet 12  . doxycycline (VIBRA-TABS) 100 MG tablet Take 1 tablet (100 mg total) by mouth 2 (two) times daily. 14 tablet 0  . folic acid (FOLVITE) 1 MG tablet TAKE 1 TABLET (1 MG TOTAL) BY MOUTH DAILY. 30 tablet 2  . furosemide (LASIX) 20 MG tablet Take 1 tablet (20 mg total) by mouth daily. One tablet daily as needed for swelling 90 tablet 1  . meclizine (ANTIVERT) 25 MG tablet Take 1 tablet (25 mg total) by mouth 3 (three) times daily as needed for dizziness. 60 tablet 0  . meloxicam (MOBIC) 7.5 MG tablet     . Multiple Vitamin (MULTIVITAMIN) capsule Take 1 capsule by mouth daily. Reported on 02/24/2016    . Naproxen-Esomeprazole 375-20 MG TBEC Take by mouth.    . Omega-3 Fatty Acids (FISH OIL) 1000 MG  CAPS Take 1 capsule by mouth daily. Reported on 02/24/2016    . ondansetron (ZOFRAN-ODT) 4 MG disintegrating tablet Take 1 tablet (4 mg total) by mouth every 4 (four) hours as needed for nausea or vomiting. 20 tablet 2  . senna (SENOKOT) 8.6 MG tablet Take 1 tablet by mouth daily. Take as needed around time of chemotherapy    . spironolactone (ALDACTONE) 25 MG tablet Take 1 tablet (25 mg total) by mouth daily. 90 tablet 3  . VIMOVO 500-20 MG TBEC     . vitamin B-12 (CYANOCOBALAMIN) 1000 MCG tablet Take 1,000 mcg by mouth daily.     No current facility-administered medications for this visit.    Facility-Administered Medications Ordered in Other Visits  Medication Dose Route Frequency Provider Last Rate Last Dose  .  sodium chloride 0.9 % injection 10 mL  10 mL Intracatheter PRN [name], MD   10 mL at 05/27/15 1543  . sodium chloride 0.9 % injection 10 mL  10 mL Intracatheter PRN [name], MD   10 mL at 06/17/15 1510  . sodium chloride 0.9 % injection 10 mL  10 mL Intravenous PRN [name], MD   10 mL at 07/08/15 1340  . sodium chloride 0.9 % injection 10 mL  10 mL Intracatheter PRN [name], MD        PHYSICAL EXAMINATION: ECOG PERFORMANCE STATUS: 0 - Asymptomatic  BP 108/71 (BP Location: Left Arm, Patient Position: Sitting)   Pulse 65   Temp (!) 96.4 F (35.8 C) (Tympanic)   Resp 18   Wt 199 lb 8 oz (90.5 kg)   BMI 28.63 kg/m   Filed Weights   11/21/16 1416  Weight: 199 lb 8 oz (90.5 kg)     GENERAL: Well-nourished well-developed; Alert, [name]; good dentition  NECK: supple, [name], [name]; 1+ bilateral lower extremity swelling. ABDOMEN:abdomen soft, [name];  LABORATORY DATA:  I have reviewed the data as listed    Component Value Date/Time   NA 132 (L) 11/21/2016 1059   NA 138 12/10/2014 1341   K 3.9 11/21/2016 1059   K 3.6 12/10/2014 1341   CL 99 (L) 11/21/2016 1059   CL 102 12/10/2014 1341   CO2 29 11/21/2016 1059   CO2 30 12/10/2014 1341   GLUCOSE 102 (H) 11/21/2016 1059   GLUCOSE 160 (H) 12/10/2014 1341   BUN 13 11/21/2016 1059   BUN 21 (H) 12/10/2014 1341   CREATININE 0.96 11/21/2016 1059   CREATININE 1.10 03/25/2015 1453   CALCIUM 9.0 11/21/2016 1059    CALCIUM 8.4 (L) 12/10/2014 1341   PROT 6.8 11/21/2016 1059   PROT 6.6 12/10/2014 1341   ALBUMIN 4.0 11/21/2016 1059   ALBUMIN 3.4 12/10/2014 1341   AST 24 11/21/2016 1059   AST 17 12/10/2014 1341   ALT 15 (L) 11/21/2016 1059   ALT 23 12/10/2014 1341   ALKPHOS 37 (L) 11/21/2016 1059   ALKPHOS 50 12/10/2014 1341   BILITOT 0.9 11/21/2016 1059   BILITOT 0.5 12/10/2014 1341   GFRNONAA >60  11/21/2016 1059   GFRNONAA >60 03/25/2015 1453   GFRAA >60 11/21/2016 1059   GFRAA >60 03/25/2015 1453    No results found for: SPEP, UPEP  Lab Results  Component Value Date   WBC 4.1 11/21/2016   NEUTROABS 2.1 11/21/2016   HGB 13.3 11/21/2016   HCT 39.0 (L) 11/21/2016   MCV 93.3 11/21/2016   PLT 144 (L) 11/21/2016      Chemistry      Component Value Date/Time   NA 132 (L) 11/21/2016 1059   NA 138 12/10/2014 1341   K 3.9 11/21/2016 1059   K 3.6 12/10/2014 1341   CL 99 (L) 11/21/2016 1059   CL 102 12/10/2014 1341   CO2 29 11/21/2016 1059   CO2 30 12/10/2014 1341   BUN 13 11/21/2016 1059   BUN 21 (H) 12/10/2014 1341   CREATININE 0.96 11/21/2016 1059   CREATININE 1.10 03/25/2015 1453      Component Value Date/Time   CALCIUM 9.0 11/21/2016 1059   CALCIUM 8.4 (L) 12/10/2014 1341   ALKPHOS 37 (L) 11/21/2016 1059   ALKPHOS 50 12/10/2014 1341   AST 24 11/21/2016 1059   AST 17 12/10/2014 1341   ALT 15 (L) 11/21/2016 1059   ALT 23 12/10/2014 1341   BILITOT 0.9 11/21/2016 1059   BILITOT 0.5 12/10/2014 1341       RADIOGRAPHIC STUDIES: I have personally reviewed the radiological images as listed and agreed with the findings in the report. No results found.   ASSESSMENT & PLAN:  Cancer of hilus of left lung ([place]) Stage IV NED. Patient currently on Alimta maintenance since 2012. Clinically no evidence of progression.SEP  2017- CT chest abdomen pelvis NED.   # Continue maintenance Alimta. Tolerating well without any major side effects. On folate. Will await scans in Jan 2018.   #  Grade 1; continue Lasix/aldactone daily.   # Follow-up with me in 3 weeks' labs chemotherapy/ scans prior.   No orders of the defined types were placed in this encounter.     [name], MD 11/21/2016 5:17 PM

## 2016-11-21 NOTE — Assessment & Plan Note (Signed)
Stage IV NED. Patient currently on Alimta maintenance since 2012. Clinically no evidence of progression.SEP  2017- CT chest abdomen pelvis NED.   # Continue maintenance Alimta. Tolerating well without any major side effects. On folate. Will await scans in Jan 2018.   # Grade 1; continue Lasix/aldactone daily.   # Follow-up with me in 3 weeks' labs chemotherapy/ scans prior.

## 2016-12-10 ENCOUNTER — Ambulatory Visit
Admission: RE | Admit: 2016-12-10 | Discharge: 2016-12-10 | Disposition: A | Discharge status: Home / Self Care | Payer: BLUE CROSS/BLUE SHIELD | Source: Ambulatory Visit | Attending: Internal Medicine | Admitting: Internal Medicine

## 2016-12-10 DIAGNOSIS — C3480 Malignant neoplasm of overlapping sites of unspecified bronchus and lung: Secondary | ICD-10-CM | POA: Insufficient documentation

## 2016-12-10 DIAGNOSIS — C33 Malignant neoplasm of trachea: Secondary | ICD-10-CM | POA: Diagnosis not present

## 2016-12-10 DIAGNOSIS — I700 Atherosclerosis of aorta: Secondary | ICD-10-CM | POA: Insufficient documentation

## 2016-12-10 DIAGNOSIS — C3490 Malignant neoplasm of unspecified part of unspecified bronchus or lung: Secondary | ICD-10-CM | POA: Diagnosis not present

## 2016-12-10 DIAGNOSIS — I712 Thoracic aortic aneurysm, without rupture: Secondary | ICD-10-CM | POA: Diagnosis not present

## 2016-12-10 MED ORDER — IOPAMIDOL (ISOVUE-300) INJECTION 61%
85.0000 mL | Freq: Once | INTRAVENOUS | Status: AC | PRN
  Administered 2016-12-10: 85 mL via INTRAVENOUS

## 2016-12-11 ENCOUNTER — Telehealth: Payer: Self-pay | Admitting: *Deleted

## 2016-12-11 ENCOUNTER — Other Ambulatory Visit: Payer: Self-pay | Admitting: Internal Medicine

## 2016-12-11 MED ORDER — LEVOFLOXACIN 500 MG PO TABS: 500.0000 mg | ORAL_TABLET | Freq: Every day | ORAL | 0 refills | Status: DC

## 2016-12-11 NOTE — Telephone Encounter (Signed)
-----   Message from [name], MD sent at 12/11/2016  9:19 AM EST ----- Mitchell Mosley- Please inform patient that CT scan shows possible mild pneumonia- recommend Levaquin for 7 days.

## 2016-12-11 NOTE — Progress Notes (Signed)
Heather- Please inform patient that CT scan shows possible mild pneumonia- recommend Levaquin for 7 days.

## 2016-12-11 NOTE — Telephone Encounter (Signed)
Left msg for patient to return call.  Asked pt to return my call to ensure that he received this phone call. In voice mail-I Notified patient that Dr. Rogue Bussing sent an antibiotic to his pharmacy.

## 2016-12-12 ENCOUNTER — Telehealth: Payer: Self-pay | Admitting: *Deleted

## 2016-12-12 NOTE — Telephone Encounter (Signed)
Received t/c from Mr. Lagrand stating that he had received Heather's message from yesterday and wanted to let her & Dr. Rogue Bussing know that he will pick up the medication today and get started on it. Questions what his CT showed and results from Impression section #2 was read to patient. Mr. Hartlage states he has no real symptoms consistent with pneumonia and has not really felt bad. Denies experiencing a cough, but does report some sinus drainage with a little blood, which he attributes to the extremely cold, dry air recently. States he has had a couple of episodes of feeling lightheaded for a few seconds and questions what might cause this. He also reports experiencing low back pain for 1-2 days last week, but states this has now resolved. Dr. Rogue Bussing informed of above symptoms and that Mr. Spellman will puick up his prescription today. Dr. Rogue Bussing states he will need to see and evaluate patient on Friday before determining whether his treatment will have to be delayed. Yolande Jolly, BSN, MHA, OCN 12/12/2016  9:20 AM  T/C back to Mr. Corwin to inform that Dr. Rogue Bussing will wait and assess his condition on Friday before determining whether he will receive chemotherapy. Patient states he was reading the side effects of the antibiotic Levofloxacin that Dr. B prescribed and one of the side effects is the potential for tendonitis, particularly achilles heel. 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 Instructed patient that he should not be exerting himself playing tennis while he is taking the antibiotic. Will check with Dr. Rogue Bussing about what his activity level should be. Yolande Jolly, BSN, MHA, OCN 12/12/2016 10:21 AM

## 2016-12-13 ENCOUNTER — Other Ambulatory Visit: Payer: Self-pay | Admitting: *Deleted

## 2016-12-13 DIAGNOSIS — C3480 Malignant neoplasm of overlapping sites of unspecified bronchus and lung: Principal | ICD-10-CM

## 2016-12-13 DIAGNOSIS — C33 Malignant neoplasm of trachea: Secondary | ICD-10-CM

## 2016-12-14 ENCOUNTER — Inpatient Hospital Stay: Payer: BLUE CROSS/BLUE SHIELD | Attending: Internal Medicine

## 2016-12-14 ENCOUNTER — Encounter: Payer: Self-pay | Admitting: *Deleted

## 2016-12-14 ENCOUNTER — Inpatient Hospital Stay: Payer: BLUE CROSS/BLUE SHIELD

## 2016-12-14 ENCOUNTER — Other Ambulatory Visit: Payer: Self-pay | Admitting: Internal Medicine

## 2016-12-14 ENCOUNTER — Inpatient Hospital Stay (HOSPITAL_BASED_OUTPATIENT_CLINIC_OR_DEPARTMENT_OTHER): Payer: BLUE CROSS/BLUE SHIELD | Admitting: Internal Medicine

## 2016-12-14 VITALS — BP 116/70 | HR 77 | Temp 96.8°F | Wt 203.4 lb

## 2016-12-14 DIAGNOSIS — C3491 Malignant neoplasm of unspecified part of right bronchus or lung: Secondary | ICD-10-CM

## 2016-12-14 DIAGNOSIS — I38 Endocarditis, valve unspecified: Secondary | ICD-10-CM | POA: Diagnosis not present

## 2016-12-14 DIAGNOSIS — C33 Malignant neoplasm of trachea: Secondary | ICD-10-CM

## 2016-12-14 DIAGNOSIS — R0789 Other chest pain: Secondary | ICD-10-CM | POA: Insufficient documentation

## 2016-12-14 DIAGNOSIS — R600 Localized edema: Secondary | ICD-10-CM

## 2016-12-14 DIAGNOSIS — C3402 Malignant neoplasm of left main bronchus: Secondary | ICD-10-CM

## 2016-12-14 DIAGNOSIS — Z79899 Other long term (current) drug therapy: Secondary | ICD-10-CM | POA: Diagnosis not present

## 2016-12-14 DIAGNOSIS — C3480 Malignant neoplasm of overlapping sites of unspecified bronchus and lung: Principal | ICD-10-CM

## 2016-12-14 DIAGNOSIS — C7802 Secondary malignant neoplasm of left lung: Secondary | ICD-10-CM | POA: Insufficient documentation

## 2016-12-14 DIAGNOSIS — Z5111 Encounter for antineoplastic chemotherapy: Secondary | ICD-10-CM

## 2016-12-14 DIAGNOSIS — C801 Malignant (primary) neoplasm, unspecified: Secondary | ICD-10-CM

## 2016-12-14 LAB — CBC WITH DIFFERENTIAL/PLATELET
BASOS PCT: 1 %
Basophils Absolute: 0 10*3/uL (ref 0–0.1)
Eosinophils Absolute: 0.2 10*3/uL (ref 0–0.7)
Eosinophils Relative: 3 %
HEMATOCRIT: 40.8 % (ref 40.0–52.0)
Hemoglobin: 13.8 g/dL (ref 13.0–18.0)
Lymphocytes Relative: 23 %
Lymphs Abs: 1.2 10*3/uL (ref 1.0–3.6)
MCH: 31.7 pg (ref 26.0–34.0)
MCHC: 33.7 g/dL (ref 32.0–36.0)
MCV: 93.9 fL (ref 80.0–100.0)
MONO ABS: 0.6 10*3/uL (ref 0.2–1.0)
MONOS PCT: 11 %
NEUTROS ABS: 3.3 10*3/uL (ref 1.4–6.5)
Neutrophils Relative %: 62 %
Platelets: 155 10*3/uL (ref 150–440)
RBC: 4.35 MIL/uL — ABNORMAL LOW (ref 4.40–5.90)
RDW: 13.8 % (ref 11.5–14.5)
WBC: 5.3 10*3/uL (ref 3.8–10.6)

## 2016-12-14 LAB — COMPREHENSIVE METABOLIC PANEL
ALBUMIN: 4.1 g/dL (ref 3.5–5.0)
ALT: 18 U/L (ref 17–63)
ANION GAP: 7 (ref 5–15)
AST: 24 U/L (ref 15–41)
Alkaline Phosphatase: 40 U/L (ref 38–126)
BILIRUBIN TOTAL: 0.6 mg/dL (ref 0.3–1.2)
BUN: 26 mg/dL — ABNORMAL HIGH (ref 6–20)
CO2: 25 mmol/L (ref 22–32)
Calcium: 9.5 mg/dL (ref 8.9–10.3)
Chloride: 101 mmol/L (ref 101–111)
Creatinine, Ser: 1.17 mg/dL (ref 0.61–1.24)
GFR calc non Af Amer: >60 mL/min (ref >60)
GLUCOSE: 98 mg/dL (ref 65–99)
Potassium: 4.3 mmol/L (ref 3.5–5.1)
Sodium: 133 mmol/L — ABNORMAL LOW (ref 135–145)
TOTAL PROTEIN: 7.2 g/dL (ref 6.5–8.1)

## 2016-12-14 LAB — URINALYSIS, COMPLETE (UACMP) WITH MICROSCOPIC
BILIRUBIN URINE: NEGATIVE
Bacteria, UA: NONE SEEN
GLUCOSE, UA: NEGATIVE mg/dL
HGB URINE DIPSTICK: NEGATIVE
KETONES UR: NEGATIVE mg/dL
LEUKOCYTES UA: NEGATIVE
NITRITE: NEGATIVE
PROTEIN: NEGATIVE mg/dL
Specific Gravity, Urine: 1.006 (ref 1.005–1.030)
Squamous Epithelial / LPF: NONE SEEN
WBC, UA: NONE SEEN WBC/hpf (ref 0–5)
pH: 6 (ref 5.0–8.0)

## 2016-12-14 MED ORDER — CYANOCOBALAMIN 1000 MCG/ML IJ SOLN
1000.0000 ug | Freq: Once | INTRAMUSCULAR | Status: AC
  Administered 2016-12-14: 1000 ug via INTRAMUSCULAR
  Filled 2016-12-14: qty 1

## 2016-12-14 MED ORDER — CYANOCOBALAMIN 1000 MCG/ML IJ SOLN: 1000.0000 ug | Freq: Once | INTRAMUSCULAR | Status: DC

## 2016-12-14 NOTE — Progress Notes (Signed)
Patient here today for follow up. Patient states that he still has some mild discomfort in chest from pneumonia

## 2016-12-14 NOTE — Progress Notes (Signed)
[place] OFFICE PROGRESS NOTE  Patient Care Team: [name], [name], stage 4 [facility])   Staging form: Lung, AJCC 7th Edition     Clinical stage from 05/24/2015: Stage IV ([place], NX, [name], MD on 05/24/2015    Oncology History   # 2012- METASTATIC ADENO CA of LEFT LUNG [acinar pattern] s/p MED LN Bx [NEG- EGFR/K-ras/ALK mutation];[name]; [name]; CT JULY 2016- NED; JAN 2018- NED  # Thoracic Aneurysm [4cm- stable]     Malignant neoplasm of trachea, bronchus, and lung ([place])   05/18/2016 Initial Diagnosis    Malignant neoplasm of trachea, bronchus, and lung ([place])       Cancer of hilus of left lung ([place])   07/20/2016 Initial Diagnosis    Cancer of hilus of left lung ([place])       INTERVAL HISTORY:  [name] with above history of metastatic adenocarcinoma the lung currently on maintenance Alimta since June 2012 is here for follow-up prior to his chemotherapy; Review the results of his restaging CAT scan.  In the interim patient was started on Levaquin based on infiltrates noted on CT scan.   Patient denies any significant cough. Denies any significant shortness of breath. Admits to mild chest pressure however this is improved. Marland KitchenAppetite is good. No weight loss. Intermittent swelling in the legs for Which he takes daily Lasix/aldactone.    REVIEW OF SYSTEMS:  A complete 10 point review of system is done which is negative except mentioned above/history of present illness.   PAST MEDICAL HISTORY :  Past Medical History:  Diagnosis Date  . Allergy   . Lung cancer ([place])   . Mild valvular heart disease Nov. 2014   per 2 D Echocardiogram done for persistent leg edema, monitored by cardiology  . Peripheral edema     PAST SURGICAL  HISTORY :   Past Surgical History:  Procedure Laterality Date  . ELBOW SURGERY    . Left anterior thoracotomy with biopsy  March 2012  . PORTACATH PLACEMENT  March 2012  . TONSILLECTOMY      FAMILY HISTORY :   Family History  Problem Relation Age of Onset  . Leukemia Father   . Diabetes Mother     SOCIAL HISTORY:   Social History  Substance Use Topics  . Smoking status: Never Smoker  . Smokeless tobacco: Never Used  . Alcohol use 0.0 oz/week     Comment: "social drinker"     ALLERGIES:  is allergic to penicillins.  MEDICATIONS:  Current Outpatient Prescriptions  Medication Sig Dispense Refill  . CIALIS 20 MG tablet TAKE 1/2 TO 1 TABLET BY MOUTH EVERY 2 DAYS AS NEEDED 10 tablet 12  . folic acid (FOLVITE) 1 MG tablet TAKE 1 TABLET (1 MG TOTAL) BY MOUTH DAILY. 30 tablet 2  . furosemide (LASIX) 20 MG tablet Take 1 tablet (20 mg total) by mouth daily. One tablet daily as needed for swelling 90 tablet 1  . levofloxacin (LEVAQUIN) 500 MG tablet Take 1 tablet (500 mg total) by mouth daily. 7 tablet 0  . meloxicam (MOBIC) 7.5 MG tablet Take 7.5 mg by mouth daily.     . Multiple Vitamin (MULTIVITAMIN) capsule Take 1 capsule by mouth daily. Reported on 02/24/2016    . Omega-3 Fatty Acids (FISH OIL)  1000 MG CAPS Take 1 capsule by mouth daily. Reported on 02/24/2016    . senna (SENOKOT) 8.6 MG tablet Take 1 tablet by mouth daily. Take as needed around time of chemotherapy    . spironolactone (ALDACTONE) 25 MG tablet Take 1 tablet (25 mg total) by mouth daily. 90 tablet 3  . vitamin B-12 (CYANOCOBALAMIN) 1000 MCG tablet Take 1,000 mcg by mouth daily.    . ondansetron (ZOFRAN-ODT) 4 MG disintegrating tablet Take 1 tablet (4 mg total) by mouth every 4 (four) hours as needed for nausea or vomiting. (Patient not taking: Reported on 12/14/2016) 20 tablet 2   No current facility-administered medications for this visit.    Facility-Administered Medications Ordered in Other Visits  Medication  Dose Route Frequency Provider Last Rate Last Dose  . sodium chloride 0.9 % injection 10 mL  10 mL Intracatheter PRN [name], MD   10 mL at 05/27/15 1543  . sodium chloride 0.9 % injection 10 mL  10 mL Intracatheter PRN [name], MD   10 mL at 06/17/15 1510  . sodium chloride 0.9 % injection 10 mL  10 mL Intravenous PRN [name], MD   10 mL at 07/08/15 1340    PHYSICAL EXAMINATION: ECOG PERFORMANCE STATUS: 0 - Asymptomatic  BP 116/70 (BP Location: Right Arm, Patient Position: Sitting)   Pulse 77   Temp (!) 96.8 F (36 C) (Tympanic)   Wt 203 lb 6 oz (92.3 kg)   BMI 29.18 kg/m   Filed Weights   12/14/16 1357  Weight: 203 lb 6 oz (92.3 kg)     GENERAL: Well-nourished well-developed; Alert, [name]; good dentition  NECK: supple, [name], [name]; 1+ bilateral lower extremity swelling. ABDOMEN:abdomen soft, [name];  LABORATORY DATA:  I have reviewed the data as listed    Component Value Date/Time   NA 133 (L) 12/14/2016 1050   NA 138 12/10/2014 1341   K 4.3 12/14/2016 1050   K 3.6 12/10/2014 1341   CL 101 12/14/2016 1050   CL 102 12/10/2014 1341   CO2 25 12/14/2016 1050   CO2 30 12/10/2014 1341   GLUCOSE 98 12/14/2016 1050   GLUCOSE 160 (H) 12/10/2014 1341   BUN 26 (H) 12/14/2016 1050   BUN 21 (H) 12/10/2014 1341   CREATININE 1.17 12/14/2016 1050   CREATININE 1.10 03/25/2015 1453   CALCIUM 9.5 12/14/2016 1050   CALCIUM 8.4 (L) 12/10/2014 1341   PROT 7.2 12/14/2016 1050    PROT 6.6 12/10/2014 1341   ALBUMIN 4.1 12/14/2016 1050   ALBUMIN 3.4 12/10/2014 1341   AST 24 12/14/2016 1050   AST 17 12/10/2014 1341   ALT 18 12/14/2016 1050   ALT 23 12/10/2014 1341   ALKPHOS 40 12/14/2016 1050   ALKPHOS 50 12/10/2014 1341   BILITOT 0.6 12/14/2016 1050   BILITOT 0.5 12/10/2014 1341   GFRNONAA >60 12/14/2016 1050   GFRNONAA >60 03/25/2015 1453   GFRAA >60 12/14/2016 1050   GFRAA >60 03/25/2015 1453    No results found for: SPEP, UPEP  Lab Results  Component Value Date   WBC 5.3 12/14/2016   NEUTROABS 3.3 12/14/2016   HGB 13.8 12/14/2016   HCT 40.8 12/14/2016   MCV 93.9 12/14/2016   PLT 155 12/14/2016      Chemistry      Component Value Date/Time   NA 133 (L) 12/14/2016 1050   NA 138 12/10/2014 1341   K 4.3 12/14/2016 1050   K 3.6 12/10/2014 1341   CL 101 12/14/2016 1050   CL 102 12/10/2014 1341   CO2 25 12/14/2016 1050   CO2 30 12/10/2014 1341   BUN 26 (H) 12/14/2016 1050   BUN 21 (H) 12/10/2014 1341   CREATININE 1.17 12/14/2016 1050   CREATININE 1.10 03/25/2015 1453      Component Value Date/Time   CALCIUM 9.5 12/14/2016 1050   CALCIUM 8.4 (L) 12/10/2014 1341   ALKPHOS 40 12/14/2016 1050   ALKPHOS 50 12/10/2014 1341   AST 24 12/14/2016 1050   AST 17 12/10/2014 1341   ALT 18 12/14/2016 1050   ALT 23 12/10/2014 1341   BILITOT 0.6 12/14/2016 1050   BILITOT 0.5 12/10/2014 1341       RADIOGRAPHIC STUDIES: I have personally reviewed the radiological images as listed and agreed with the findings in the report. No results found.   ASSESSMENT & PLAN:  Cancer of hilus of left lung ([place]) Stage IV NED. Patient currently on Alimta maintenance since 2012. Clinically no evidence of progression. JAN 2018-- CT chest abdomen pelvis NED; but bilateral lower lobe infiltrates.   # HOLD treatment today/ for possible pneumonia.   # Infiltrates noted on the CT scan- patient on Levaquin.  # Grade 1; continue Lasix/aldactone daily.   # Follow-up  with me in 3 weeks' labs chemotherapy.   No orders of the defined types were placed in this encounter.     [name], MD 12/14/2016 2:12 PM

## 2016-12-14 NOTE — Progress Notes (Signed)
Mitchell Mosley returns to clinic today for consideration of cycle #597 Alimta infusion on the [employer] research study. VS remain stable and patient is afebrile. His weight is up a little today at 92.3 kg (203lbs 6oz). Patient denies experiencing fever, chills, cough, shortness of breath or any new symptoms related to pneumonia other than some new mild bilateral chest pain and increased fatigue at times. States he has needed more sleep over the past week or so due to fatigue. He also reports having lower back pain for about 2 days last week. This has now resolved and U/A collected this morning was negative for UTI. Reports he is taking the Levaquin as previously instructed and this is his third day on the medication. Questions Dr. 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 Dr. Rogue Bussing recommended that Mitchell [place] continue with light activity for a few more days. Patient also continues to have some wrist pain and now reports some new swelling in his hands this week, which has improved a little today. He continues to have trace edema in his BLE and takes Lasix as needed and Spironolactone daily for edema. Serum sodium level remains slightly low at '133mg'$ /dL, which is a grade 1 hyponatremia. Platelets have recovered to 155,000. CBC and chemistries are otherwise within acceptable parameters; however Dr. Rogue Bussing wants to hold patient's Alimta infusion today due to patient having pneumonia. Will reschedule chemotherapy in 2-3 weeks, but Mitchell Mosley will still get his B12 injection today as scheduled. Adverse events with grade and attribution as follows:    Adverse Event Log  Study/Protocol: Eli Lilly S130 Cycle: #597 (assessed for #596)  Event Grade Onset Date Resolved Date Drug Name Pemetrexed Attribution Treatment Comments  Edema (mild) Grade 1 05/30/12 ongoing  Possible None Trace in ble to calf; now trace in hands bil.  Fatigue Grade  1 02/03/16 ongoing  Possible None Sleeping more lately  Hyponatremia Grade 1 07/20/16 ongoing  Possible None   Tendonitis Grade 1 09/21/16 ongoing  Unrelated Anti-inflammatory drug Vimovo Unchanged per patient  Low platelet count Grade 1 10/12/16 ongoing 12/14/2016 Possible None Counts increased to 155,000  Pneumonia (possible)  Grade 2 12/10/16   Unlikely Levaquin bilateral pulmonary infiltrates        Yolande Jolly, BSN, MHA, OCN 12/14/2016 2:28 PM

## 2016-12-14 NOTE — Assessment & Plan Note (Addendum)
Stage IV NED. Patient currently on Alimta maintenance since 2012. Clinically no evidence of progression. JAN 2018-- CT chest abdomen pelvis NED; but bilateral lower lobe infiltrates.   # HOLD treatment today/ for possible pneumonia.   # Infiltrates noted on the CT scan- patient on Levaquin.  # Grade 1; continue Lasix/aldactone daily.   # Follow-up with me in 3 weeks' labs chemotherapy.   # I reviewed the blood work- with the patient in detail; also reviewed the imaging independently [as summarized above]; and with the patient in detail.

## 2016-12-25 ENCOUNTER — Telehealth: Payer: Self-pay | Admitting: *Deleted

## 2016-12-25 NOTE — Telephone Encounter (Signed)
Received phone call from Mitchell Mosley this morning questioning whether he will need to have a follow up scan prior to his next Alimta infusion since he had some pneumonia on the previous scan. Patient reports he has finished taking his antibiotic and remains asymptomatic. This was reported to Dr. Rogue Bussing and he states no additional scan is needed at this time. T/C made back to Mr. [name] and informed him that he will not need another scan prior to his next treatment, which is scheduled on Monday. Yolande Jolly, BSN, MHA, OCN 12/25/2016 11:07 AM

## 2016-12-31 ENCOUNTER — Inpatient Hospital Stay: Payer: BLUE CROSS/BLUE SHIELD

## 2016-12-31 ENCOUNTER — Inpatient Hospital Stay (HOSPITAL_BASED_OUTPATIENT_CLINIC_OR_DEPARTMENT_OTHER): Payer: BLUE CROSS/BLUE SHIELD | Admitting: Internal Medicine

## 2016-12-31 ENCOUNTER — Encounter: Payer: Self-pay | Admitting: *Deleted

## 2016-12-31 VITALS — BP 110/71 | HR 83 | Temp 97.4°F | Wt 202.0 lb

## 2016-12-31 DIAGNOSIS — C7802 Secondary malignant neoplasm of left lung: Secondary | ICD-10-CM | POA: Diagnosis not present

## 2016-12-31 DIAGNOSIS — C33 Malignant neoplasm of trachea: Secondary | ICD-10-CM

## 2016-12-31 DIAGNOSIS — C3491 Malignant neoplasm of unspecified part of right bronchus or lung: Secondary | ICD-10-CM | POA: Diagnosis not present

## 2016-12-31 DIAGNOSIS — C3402 Malignant neoplasm of left main bronchus: Secondary | ICD-10-CM

## 2016-12-31 DIAGNOSIS — R600 Localized edema: Secondary | ICD-10-CM

## 2016-12-31 DIAGNOSIS — I38 Endocarditis, valve unspecified: Secondary | ICD-10-CM | POA: Diagnosis not present

## 2016-12-31 DIAGNOSIS — C3480 Malignant neoplasm of overlapping sites of unspecified bronchus and lung: Principal | ICD-10-CM

## 2016-12-31 DIAGNOSIS — Z79899 Other long term (current) drug therapy: Secondary | ICD-10-CM

## 2016-12-31 DIAGNOSIS — C3490 Malignant neoplasm of unspecified part of unspecified bronchus or lung: Secondary | ICD-10-CM

## 2016-12-31 DIAGNOSIS — R0789 Other chest pain: Secondary | ICD-10-CM | POA: Diagnosis not present

## 2016-12-31 LAB — COMPREHENSIVE METABOLIC PANEL
ALBUMIN: 4.1 g/dL (ref 3.5–5.0)
ALT: 17 U/L (ref 17–63)
AST: 26 U/L (ref 15–41)
Alkaline Phosphatase: 45 U/L (ref 38–126)
Anion gap: 7 (ref 5–15)
BILIRUBIN TOTAL: 0.6 mg/dL (ref 0.3–1.2)
BUN: 15 mg/dL (ref 6–20)
CHLORIDE: 101 mmol/L (ref 101–111)
CO2: 27 mmol/L (ref 22–32)
Calcium: 8.9 mg/dL (ref 8.9–10.3)
Creatinine, Ser: 0.98 mg/dL (ref 0.61–1.24)
GFR calc Af Amer: >60 mL/min (ref >60)
GFR calc non Af Amer: >60 mL/min (ref >60)
GLUCOSE: 142 mg/dL — AB (ref 65–99)
POTASSIUM: 3.7 mmol/L (ref 3.5–5.1)
SODIUM: 135 mmol/L (ref 135–145)
TOTAL PROTEIN: 7.4 g/dL (ref 6.5–8.1)

## 2016-12-31 LAB — URINALYSIS, COMPLETE (UACMP) WITH MICROSCOPIC
BACTERIA UA: NONE SEEN
Bilirubin Urine: NEGATIVE
GLUCOSE, UA: NEGATIVE mg/dL
Hgb urine dipstick: NEGATIVE
KETONES UR: NEGATIVE mg/dL
LEUKOCYTES UA: NEGATIVE
Nitrite: NEGATIVE
PROTEIN: NEGATIVE mg/dL
SQUAMOUS EPITHELIAL / LPF: NONE SEEN
Specific Gravity, Urine: 1.004 — ABNORMAL LOW (ref 1.005–1.030)
pH: 6 (ref 5.0–8.0)

## 2016-12-31 LAB — CBC WITH DIFFERENTIAL/PLATELET
BASOS ABS: 0 10*3/uL (ref 0–0.1)
BASOS PCT: 1 %
EOS ABS: 0.2 10*3/uL (ref 0–0.7)
Eosinophils Relative: 5 %
HCT: 41.3 % (ref 40.0–52.0)
Hemoglobin: 14.4 g/dL (ref 13.0–18.0)
Lymphocytes Relative: 29 %
Lymphs Abs: 1.4 10*3/uL (ref 1.0–3.6)
MCH: 32.1 pg (ref 26.0–34.0)
MCHC: 34.8 g/dL (ref 32.0–36.0)
MCV: 92.2 fL (ref 80.0–100.0)
MONO ABS: 0.3 10*3/uL (ref 0.2–1.0)
MONOS PCT: 6 %
Neutro Abs: 2.9 10*3/uL (ref 1.4–6.5)
Neutrophils Relative %: 59 %
PLATELETS: 140 10*3/uL — AB (ref 150–440)
RBC: 4.49 MIL/uL (ref 4.40–5.90)
RDW: 13.3 % (ref 11.5–14.5)
WBC: 4.8 10*3/uL (ref 3.8–10.6)

## 2016-12-31 LAB — MAGNESIUM: MAGNESIUM: 2.1 mg/dL (ref 1.7–2.4)

## 2016-12-31 MED ORDER — HEPARIN SOD (PORK) LOCK FLUSH 100 UNIT/ML IV SOLN
500.0000 [IU] | Freq: Once | INTRAVENOUS | Status: AC | PRN
  Administered 2016-12-31: 500 [IU]
  Filled 2016-12-31: qty 5

## 2016-12-31 MED ORDER — SODIUM CHLORIDE 0.9 % IV SOLN
500.0000 mg/m2 | Freq: Once | INTRAVENOUS | Status: AC
  Administered 2016-12-31: 1050 mg via INTRAVENOUS
  Filled 2016-12-31: qty 42

## 2016-12-31 MED ORDER — SODIUM CHLORIDE 0.9 % IV SOLN
Freq: Once | INTRAVENOUS | Status: AC
  Administered 2016-12-31: 12:00:00 via INTRAVENOUS
  Filled 2016-12-31: qty 4

## 2016-12-31 MED ORDER — SODIUM CHLORIDE 0.9 % IV SOLN
Freq: Once | INTRAVENOUS | Status: AC
  Administered 2016-12-31: 12:00:00 via INTRAVENOUS
  Filled 2016-12-31: qty 1000

## 2016-12-31 NOTE — Progress Notes (Signed)
Mitchell Mosley returns to clinic today for consideration of cycle #598 Alimta infusion on the [employer] research study. States he feels much better today. VS are stable and patient remains afebrile. His weight is 91.6kg (202 lbs), which is in usual range. Patient denies experiencing fever, chills, shortness of breath or any new symptoms related to recent bout of pneumonia and reports the mild chest pain he previously experienced has resolved. He does have an occasional cough, which he relates to the rainy weather and continues to experience increased fatigue at times. Reports he finished taking the Levaquin as prescribed on 12/18/16. Patient continues to report some wrist pain and takes Vimovo as prescribed for this. He also continues to have trace edema in his BLE and takes Lasix as needed and Spironolactone daily for the edema. Serum sodium level is normal today; however platelet count has decreased to 140,000 - grade 1, and patient's glucose is slightly elevated at '140mg'$ /dL - but this is not a fasting glucose and therefore was not graded. CBC and chemistries are otherwise within acceptable parameters and Dr. Rogue Bussing plans to go ahead and administer Alimta infusion today as scheduled. Adverse events with grade and attribution as follows:    Adverse Event Log Study/Protocol: Eli Lilly S130 Cycle: 597 Event Grade Onset Date Resolved Date Drug Name Pemetrexed Attribution Treatment Comments  Edema (mild) Grade 1 05/30/12 ongoing  Possible  Trace edema in ble up to calf  Fatigue Grade1 02/03/16 ongoing  Possible    Hyponatremia Grade 1 07/20/16 12/31/16  Possible  Sodium 135  Tendonitis Grade 1 09/21/16 ongoing  Unrelated Anti-inflammatory drug Vimovo Still being treated per pt  Low Platelet Count Grade 1 12/31/16 ongoing  Possible  Platelet count 140,000  Pneumonia (possible) Grade 2 12/10/16 12/31/16  Unlikely Levaquin Completed abx; no symptoms  Cough Grade 1 12/30/16   Unrelated  NP cough   Yolande Jolly, BSN, MHA, OCN 12/31/2016 11:13 AM

## 2016-12-31 NOTE — Assessment & Plan Note (Addendum)
Stage IV NED. Patient currently on Alimta maintenance since 2012.JAN 2018-- CT chest abdomen pelvis NED; but bilateral lower lobe infiltrates [see discussion below].   #  Proceed with chemo/ alimta today; platelets- 140 okay. No clinical evidence of progression at this time.  # ? Atypical infection- s/p Levaquin. No symptoms.   # Grade 1; continue Lasix/aldactone daily.   # Follow-up with me in 3 weeks' labs chemotherapy.

## 2016-12-31 NOTE — Progress Notes (Signed)
[place] OFFICE PROGRESS NOTE  Patient Care Team: [name], [name], stage 4 [facility])   Staging form: Lung, AJCC 7th Edition     Clinical stage from 05/24/2015: Stage IV ([place], NX, [name], MD on 05/24/2015    Oncology History   # 2012- METASTATIC ADENO CA of LEFT LUNG [acinar pattern] s/p MED LN Bx [NEG- EGFR/K-ras/ALK mutation];[name]; [name]; CT JULY 2016- NED; JAN 2018- NED  # Thoracic Aneurysm [4cm- stable]     Malignant neoplasm of trachea, bronchus, and lung ([place])   05/18/2016 Initial Diagnosis    Malignant neoplasm of trachea, bronchus, and lung ([place])       Cancer of hilus of left lung ([place])   07/20/2016 Initial Diagnosis    Cancer of hilus of left lung ([place])       INTERVAL HISTORY:  [name] with above history of metastatic adenocarcinoma the lung currently on maintenance Alimta since June 2012 is here for follow-up prior to his chemotherapy;  Chemotherapy was held at last visit because of "vague infiltrates noted on a restaging CT scan". Patient is asymptomatic. Patient received antibiotics.  Patient denies any significant cough. Denies any significant shortness of breath. .Appetite is good. No weight loss. Intermittent swelling in the legs for Which he takes daily Lasix/aldactone.    REVIEW OF SYSTEMS:  A complete 10 point review of system is done which is negative except mentioned above/history of present illness.   PAST MEDICAL HISTORY :  Past Medical History:  Diagnosis Date  . Allergy   . Lung cancer ([place])   . Mild valvular heart disease Nov. 2014   per 2 D Echocardiogram done for persistent leg edema, monitored by cardiology  . Peripheral edema     PAST SURGICAL HISTORY :   Past Surgical History:   Procedure Laterality Date  . ELBOW SURGERY    . Left anterior thoracotomy with biopsy  March 2012  . PORTACATH PLACEMENT  March 2012  . TONSILLECTOMY      FAMILY HISTORY :   Family History  Problem Relation Age of Onset  . Leukemia Father   . Diabetes Mother     SOCIAL HISTORY:   Social History  Substance Use Topics  . Smoking status: Never Smoker  . Smokeless tobacco: Never Used  . Alcohol use 0.0 oz/week     Comment: "social drinker"     ALLERGIES:  is allergic to penicillins.  MEDICATIONS:  Current Outpatient Prescriptions  Medication Sig Dispense Refill  . CIALIS 20 MG tablet TAKE 1/2 TO 1 TABLET BY MOUTH EVERY 2 DAYS AS NEEDED 10 tablet 12  . folic acid (FOLVITE) 1 MG tablet TAKE 1 TABLET (1 MG TOTAL) BY MOUTH DAILY. 30 tablet 2  . furosemide (LASIX) 20 MG tablet Take 1 tablet (20 mg total) by mouth daily. One tablet daily as needed for swelling 90 tablet 1  . meloxicam (MOBIC) 7.5 MG tablet Take 7.5 mg by mouth daily.     . Multiple Vitamin (MULTIVITAMIN) capsule Take 1 capsule by mouth daily. Reported on 02/24/2016    . Omega-3 Fatty Acids (FISH OIL) 1000 MG CAPS Take 1 capsule by mouth daily. Reported on 02/24/2016    . ondansetron (ZOFRAN-ODT) 4 MG disintegrating tablet Take 1 tablet (4 mg total) by mouth  every 4 (four) hours as needed for nausea or vomiting. 20 tablet 2  . senna (SENOKOT) 8.6 MG tablet Take 1 tablet by mouth daily. Take as needed around time of chemotherapy    . spironolactone (ALDACTONE) 25 MG tablet Take 1 tablet (25 mg total) by mouth daily. 90 tablet 3  . vitamin B-12 (CYANOCOBALAMIN) 1000 MCG tablet Take 1,000 mcg by mouth daily.     No current facility-administered medications for this visit.    Facility-Administered Medications Ordered in Other Visits  Medication Dose Route Frequency Provider Last Rate Last Dose  . heparin lock flush 100 unit/mL  500 Units Intracatheter Once PRN [name], MD      . INV-PEMEtrexed Lilly S130  (ALIMTA) 1,050 mg in sodium chloride 0.9 % 100 mL chemo infusion  500 mg/m2 (Treatment Plan Recorded) Intravenous Once [name], MD   1,050 mg at 12/31/16 1228  . sodium chloride 0.9 % injection 10 mL  10 mL Intracatheter PRN [name], MD   10 mL at 05/27/15 1543  . sodium chloride 0.9 % injection 10 mL  10 mL Intracatheter PRN [name], MD   10 mL at 06/17/15 1510  . sodium chloride 0.9 % injection 10 mL  10 mL Intravenous PRN [name], MD   10 mL at 07/08/15 1340    PHYSICAL EXAMINATION: ECOG PERFORMANCE STATUS: 0 - Asymptomatic  BP 110/71 (BP Location: Right Arm, Patient Position: Sitting)   Pulse 83   Temp 97.4 F (36.3 C) (Tympanic)   Wt 202 lb (91.6 kg)   BMI 28.98 kg/m   Filed Weights   12/31/16 1033  Weight: 202 lb (91.6 kg)     GENERAL: Well-nourished well-developed; Alert, [name]; good dentition  NECK: supple, [name], [name]; 1+ bilateral lower extremity swelling. ABDOMEN:abdomen soft, [name];  LABORATORY DATA:  I have reviewed the data as listed    Component Value Date/Time   NA 135 12/31/2016 0953   NA 138 12/10/2014 1341   K 3.7 12/31/2016 0953   K 3.6 12/10/2014 1341   CL 101 12/31/2016 0953   CL 102 12/10/2014 1341   CO2 27 12/31/2016 0953   CO2 30 12/10/2014 1341   GLUCOSE 142 (H) 12/31/2016 0953   GLUCOSE 160 (H) 12/10/2014 1341   BUN 15 12/31/2016 0953   BUN 21 (H) 12/10/2014 1341   CREATININE 0.98 12/31/2016 0953   CREATININE 1.10  03/25/2015 1453   CALCIUM 8.9 12/31/2016 0953   CALCIUM 8.4 (L) 12/10/2014 1341   PROT 7.4 12/31/2016 0953   PROT 6.6 12/10/2014 1341   ALBUMIN 4.1 12/31/2016 0953   ALBUMIN 3.4 12/10/2014 1341   AST 26 12/31/2016 0953   AST 17 12/10/2014 1341   ALT 17 12/31/2016 0953   ALT 23 12/10/2014 1341   ALKPHOS 45 12/31/2016 0953   ALKPHOS 50 12/10/2014 1341   BILITOT 0.6 12/31/2016 0953   BILITOT 0.5 12/10/2014 1341   GFRNONAA >60 12/31/2016 0953   GFRNONAA >60 03/25/2015 1453   GFRAA >60 12/31/2016 4081  GFRAA >60 03/25/2015 1453    No results found for: SPEP, UPEP  Lab Results  Component Value Date   WBC 4.8 12/31/2016   NEUTROABS 2.9 12/31/2016   HGB 14.4 12/31/2016   HCT 41.3 12/31/2016   MCV 92.2 12/31/2016   PLT 140 (L) 12/31/2016      Chemistry      Component Value Date/Time   NA 135 12/31/2016 0953   NA 138 12/10/2014 1341   K 3.7 12/31/2016 0953   K 3.6 12/10/2014 1341   CL 101 12/31/2016 0953   CL 102 12/10/2014 1341   CO2 27 12/31/2016 0953   CO2 30 12/10/2014 1341   BUN 15 12/31/2016 0953   BUN 21 (H) 12/10/2014 1341   CREATININE 0.98 12/31/2016 0953   CREATININE 1.10 03/25/2015 1453      Component Value Date/Time   CALCIUM 8.9 12/31/2016 0953   CALCIUM 8.4 (L) 12/10/2014 1341   ALKPHOS 45 12/31/2016 0953   ALKPHOS 50 12/10/2014 1341   AST 26 12/31/2016 0953   AST 17 12/10/2014 1341   ALT 17 12/31/2016 0953   ALT 23 12/10/2014 1341   BILITOT 0.6 12/31/2016 0953   BILITOT 0.5 12/10/2014 1341       RADIOGRAPHIC STUDIES: I have personally reviewed the radiological images as listed and agreed with the findings in the report. No results found.   ASSESSMENT & PLAN:  Cancer of hilus of left lung ([place]) Stage IV NED. Patient currently on Alimta maintenance since 2012.JAN 2018-- CT chest abdomen pelvis NED; but bilateral lower lobe infiltrates [see discussion below].   #  Proceed with chemo/ alimta today; platelets- 140 okay. No clinical evidence of  progression at this time.  # ? Atypical infection- s/p Levaquin. No symptoms.   # Grade 1; continue Lasix/aldactone daily.   # Follow-up with me in 3 weeks' labs chemotherapy.   No orders of the defined types were placed in this encounter.     [name], MD 12/31/2016 12:29 PM

## 2016-12-31 NOTE — Progress Notes (Signed)
Patient here today for follow up.  Patient states no new concerns today  

## 2017-01-25 ENCOUNTER — Encounter: Payer: Self-pay | Admitting: *Deleted

## 2017-01-25 ENCOUNTER — Inpatient Hospital Stay (HOSPITAL_BASED_OUTPATIENT_CLINIC_OR_DEPARTMENT_OTHER): Payer: BLUE CROSS/BLUE SHIELD | Admitting: Internal Medicine

## 2017-01-25 ENCOUNTER — Inpatient Hospital Stay: Payer: BLUE CROSS/BLUE SHIELD | Attending: Internal Medicine

## 2017-01-25 ENCOUNTER — Inpatient Hospital Stay: Payer: BLUE CROSS/BLUE SHIELD

## 2017-01-25 DIAGNOSIS — C3480 Malignant neoplasm of overlapping sites of unspecified bronchus and lung: Principal | ICD-10-CM

## 2017-01-25 DIAGNOSIS — C3402 Malignant neoplasm of left main bronchus: Secondary | ICD-10-CM

## 2017-01-25 DIAGNOSIS — Z006 Encounter for examination for normal comparison and control in clinical research program: Secondary | ICD-10-CM

## 2017-01-25 DIAGNOSIS — I38 Endocarditis, valve unspecified: Secondary | ICD-10-CM | POA: Insufficient documentation

## 2017-01-25 DIAGNOSIS — C7802 Secondary malignant neoplasm of left lung: Secondary | ICD-10-CM

## 2017-01-25 DIAGNOSIS — E871 Hypo-osmolality and hyponatremia: Secondary | ICD-10-CM

## 2017-01-25 DIAGNOSIS — Z5111 Encounter for antineoplastic chemotherapy: Secondary | ICD-10-CM | POA: Diagnosis not present

## 2017-01-25 DIAGNOSIS — Z79899 Other long term (current) drug therapy: Secondary | ICD-10-CM | POA: Insufficient documentation

## 2017-01-25 DIAGNOSIS — R600 Localized edema: Secondary | ICD-10-CM

## 2017-01-25 DIAGNOSIS — C3490 Malignant neoplasm of unspecified part of unspecified bronchus or lung: Secondary | ICD-10-CM

## 2017-01-25 DIAGNOSIS — C3491 Malignant neoplasm of unspecified part of right bronchus or lung: Secondary | ICD-10-CM | POA: Insufficient documentation

## 2017-01-25 DIAGNOSIS — I712 Thoracic aortic aneurysm, without rupture: Secondary | ICD-10-CM | POA: Diagnosis not present

## 2017-01-25 DIAGNOSIS — C33 Malignant neoplasm of trachea: Secondary | ICD-10-CM

## 2017-01-25 LAB — COMPREHENSIVE METABOLIC PANEL
ALBUMIN: 4.2 g/dL (ref 3.5–5.0)
ALT: 17 U/L (ref 17–63)
ANION GAP: 7 (ref 5–15)
AST: 22 U/L (ref 15–41)
Alkaline Phosphatase: 43 U/L (ref 38–126)
BUN: 15 mg/dL (ref 6–20)
CHLORIDE: 96 mmol/L — AB (ref 101–111)
CO2: 26 mmol/L (ref 22–32)
Calcium: 9 mg/dL (ref 8.9–10.3)
Creatinine, Ser: 0.86 mg/dL (ref 0.61–1.24)
GFR calc Af Amer: >60 mL/min (ref >60)
GFR calc non Af Amer: >60 mL/min (ref >60)
GLUCOSE: 105 mg/dL — AB (ref 65–99)
POTASSIUM: 4 mmol/L (ref 3.5–5.1)
SODIUM: 129 mmol/L — AB (ref 135–145)
TOTAL PROTEIN: 7.4 g/dL (ref 6.5–8.1)
Total Bilirubin: 1.1 mg/dL (ref 0.3–1.2)

## 2017-01-25 LAB — CBC WITH DIFFERENTIAL/PLATELET
BASOS ABS: 0 10*3/uL (ref 0–0.1)
BASOS PCT: 1 %
EOS ABS: 0.1 10*3/uL (ref 0–0.7)
Eosinophils Relative: 2 %
HCT: 39.9 % — ABNORMAL LOW (ref 40.0–52.0)
Hemoglobin: 13.9 g/dL (ref 13.0–18.0)
Lymphocytes Relative: 38 %
Lymphs Abs: 1.6 10*3/uL (ref 1.0–3.6)
MCH: 32 pg (ref 26.0–34.0)
MCHC: 34.8 g/dL (ref 32.0–36.0)
MCV: 92 fL (ref 80.0–100.0)
MONO ABS: 0.5 10*3/uL (ref 0.2–1.0)
MONOS PCT: 11 %
NEUTROS ABS: 2.1 10*3/uL (ref 1.4–6.5)
Neutrophils Relative %: 48 %
PLATELETS: 153 10*3/uL (ref 150–440)
RBC: 4.34 MIL/uL — ABNORMAL LOW (ref 4.40–5.90)
RDW: 13.5 % (ref 11.5–14.5)
WBC: 4.3 10*3/uL (ref 3.8–10.6)

## 2017-01-25 MED ORDER — SODIUM CHLORIDE 0.9 % IJ SOLN
10.0000 mL | INTRAMUSCULAR | Status: DC | PRN
  Administered 2017-01-25: 10 mL
  Filled 2017-01-25: qty 10

## 2017-01-25 MED ORDER — HEPARIN SOD (PORK) LOCK FLUSH 100 UNIT/ML IV SOLN
500.0000 [IU] | Freq: Once | INTRAVENOUS | Status: AC | PRN
  Administered 2017-01-25: 500 [IU]
  Filled 2017-01-25: qty 5

## 2017-01-25 MED ORDER — SODIUM CHLORIDE 0.9 % IV SOLN
Freq: Once | INTRAVENOUS | Status: AC
  Administered 2017-01-25: 15:00:00 via INTRAVENOUS
  Filled 2017-01-25: qty 1000

## 2017-01-25 MED ORDER — SODIUM CHLORIDE 0.9 % IV SOLN
Freq: Once | INTRAVENOUS | Status: AC
  Administered 2017-01-25: 15:00:00 via INTRAVENOUS
  Filled 2017-01-25: qty 4

## 2017-01-25 MED ORDER — SODIUM CHLORIDE 0.9 % IV SOLN
500.0000 mg/m2 | Freq: Once | INTRAVENOUS | Status: AC
  Administered 2017-01-25: 1050 mg via INTRAVENOUS
  Filled 2017-01-25: qty 42

## 2017-01-25 NOTE — Progress Notes (Signed)
Patient here for follow-up for lung cancer. He has no medical complaints today.

## 2017-01-25 NOTE — Progress Notes (Signed)
[place] OFFICE PROGRESS NOTE  Patient Care Team: [name], [name], stage 4 [facility])   Staging form: Lung, AJCC 7th Edition     Clinical stage from 05/24/2015: Stage IV ([place], NX, [name], MD on 05/24/2015    Oncology History   # 2012- METASTATIC ADENO CA of LEFT LUNG [acinar pattern] s/p MED LN Bx [NEG- EGFR/K-ras/ALK mutation];[name]; [name]; CT JULY 2016- NED; JAN 2018- NED  # Thoracic Aneurysm [4cm- stable]     Malignant neoplasm of trachea, bronchus, and lung ([place])   05/18/2016 Initial Diagnosis    Malignant neoplasm of trachea, bronchus, and lung ([place])       Cancer of hilus of left lung ([place])   07/20/2016 Initial Diagnosis    Cancer of hilus of left lung ([place])       INTERVAL HISTORY:  [name] with above history of metastatic adenocarcinoma the lung currently on maintenance Alimta since June 2012 is here for follow-up prior to his chemotherapy;  Patient denies any significant cough. Denies any significant shortness of breath. .Appetite is good. No weight loss. Intermittent swelling in the legs for Which he takes daily Lasix/aldactone. Denies any headaches. Denies any vision changes. He is very active.   REVIEW OF SYSTEMS:  A complete 10 point review of system is done which is negative except mentioned above/history of present illness.   PAST MEDICAL HISTORY :  Past Medical History:  Diagnosis Date  . Allergy   . Lung cancer ([place])   . Mild valvular heart disease Nov. 2014   per 2 D Echocardiogram done for persistent leg edema, monitored by cardiology  . Peripheral edema     PAST SURGICAL HISTORY :   Past Surgical History:  Procedure Laterality Date  . ELBOW SURGERY    . Left anterior thoracotomy with biopsy   March 2012  . PORTACATH PLACEMENT  March 2012  . TONSILLECTOMY      FAMILY HISTORY :   Family History  Problem Relation Age of Onset  . Leukemia Father   . Diabetes Mother     SOCIAL HISTORY:   Social History  Substance Use Topics  . Smoking status: Never Smoker  . Smokeless tobacco: Never Used  . Alcohol use 0.0 oz/week     Comment: "social drinker"     ALLERGIES:  is allergic to penicillins.  MEDICATIONS:  Current Outpatient Prescriptions  Medication Sig Dispense Refill  . folic acid (FOLVITE) 1 MG tablet TAKE 1 TABLET (1 MG TOTAL) BY MOUTH DAILY. 30 tablet 2  . furosemide (LASIX) 20 MG tablet Take 1 tablet (20 mg total) by mouth daily. One tablet daily as needed for swelling 90 tablet 1  . meloxicam (MOBIC) 7.5 MG tablet Take 7.5 mg by mouth daily.     . Multiple Vitamin (MULTIVITAMIN) capsule Take 1 capsule by mouth daily. Reported on 02/24/2016    . Omega-3 Fatty Acids (FISH OIL) 1000 MG CAPS Take 1 capsule by mouth daily. Reported on 02/24/2016    . spironolactone (ALDACTONE) 25 MG tablet Take 1 tablet (25 mg total) by mouth daily. 90 tablet 3  . vitamin B-12 (CYANOCOBALAMIN) 1000 MCG tablet Take 1,000 mcg by mouth daily.    Marland Kitchen CIALIS 20 MG tablet TAKE 1/2 TO 1 TABLET BY MOUTH EVERY 2  DAYS AS NEEDED (Patient not taking: Reported on 01/25/2017) 10 tablet 12  . ondansetron (ZOFRAN-ODT) 4 MG disintegrating tablet Take 1 tablet (4 mg total) by mouth every 4 (four) hours as needed for nausea or vomiting. (Patient not taking: Reported on 01/25/2017) 20 tablet 2  . senna (SENOKOT) 8.6 MG tablet Take 1 tablet by mouth daily. Take as needed around time of chemotherapy     No current facility-administered medications for this visit.    Facility-Administered Medications Ordered in Other Visits  Medication Dose Route Frequency Provider Last Rate Last Dose  . sodium chloride 0.9 % injection 10 mL  10 mL Intracatheter PRN [name], MD   10 mL at 05/27/15 1543  . sodium chloride 0.9  % injection 10 mL  10 mL Intracatheter PRN [name], MD   10 mL at 06/17/15 1510  . sodium chloride 0.9 % injection 10 mL  10 mL Intravenous PRN [name], MD   10 mL at 07/08/15 1340  . sodium chloride 0.9 % injection 10 mL  10 mL Intracatheter PRN [name], MD   10 mL at 01/25/17 1458    PHYSICAL EXAMINATION: ECOG PERFORMANCE STATUS: 0 - Asymptomatic  BP 114/76 (BP Location: Right Arm, Patient Position: Sitting)   Pulse 80   Temp 97.6 F (36.4 C) (Tympanic)   Resp 20   Ht '5\' 10"'$  (1.778 m)   Wt 202 lb 11.2 oz (91.9 kg)   BMI 29.08 kg/m   Filed Weights   01/25/17 1404  Weight: 202 lb 11.2 oz (91.9 kg)     GENERAL: Well-nourished well-developed; Alert, [name]; good dentition  NECK: supple, [name], [name]; 1+ bilateral lower extremity swelling. ABDOMEN:abdomen soft, [name];  LABORATORY DATA:  I have reviewed the data as listed    Component Value Date/Time   NA 129 (L) 01/25/2017 1122   NA 138 12/10/2014 1341   K 4.0 01/25/2017 1122   K 3.6 12/10/2014 1341   CL 96 (L) 01/25/2017 1122   CL 102 12/10/2014 1341   CO2 26 01/25/2017 1122   CO2 30 12/10/2014 1341   GLUCOSE 105 (H) 01/25/2017 1122   GLUCOSE 160 (H) 12/10/2014 1341   BUN 15 01/25/2017 1122   BUN 21 (H) 12/10/2014 1341   CREATININE 0.86 01/25/2017 1122   CREATININE 1.10 03/25/2015 1453   CALCIUM 9.0 01/25/2017 1122   CALCIUM 8.4 (L) 12/10/2014 1341   PROT 7.4 01/25/2017 1122   PROT 6.6 12/10/2014  1341   ALBUMIN 4.2 01/25/2017 1122   ALBUMIN 3.4 12/10/2014 1341   AST 22 01/25/2017 1122   AST 17 12/10/2014 1341   ALT 17 01/25/2017 1122   ALT 23 12/10/2014 1341   ALKPHOS 43 01/25/2017 1122   ALKPHOS 50 12/10/2014 1341   BILITOT 1.1 01/25/2017 1122   BILITOT 0.5 12/10/2014 1341   GFRNONAA >60 01/25/2017 1122   GFRNONAA >60 03/25/2015 1453   GFRAA >60 01/25/2017 1122   GFRAA >60 03/25/2015 1453    No results found for: SPEP, UPEP  Lab  Results  Component Value Date   WBC 4.3 01/25/2017   NEUTROABS 2.1 01/25/2017   HGB 13.9 01/25/2017   HCT 39.9 (L) 01/25/2017   MCV 92.0 01/25/2017   PLT 153 01/25/2017      Chemistry      Component Value Date/Time   NA 129 (L) 01/25/2017 1122   NA 138 12/10/2014 1341   K 4.0 01/25/2017 1122   K 3.6 12/10/2014 1341   CL 96 (L) 01/25/2017 1122   CL 102 12/10/2014 1341   CO2 26 01/25/2017 1122   CO2 30 12/10/2014 1341   BUN 15 01/25/2017 1122   BUN 21 (H) 12/10/2014 1341   CREATININE 0.86 01/25/2017 1122   CREATININE 1.10 03/25/2015 1453      Component Value Date/Time   CALCIUM 9.0 01/25/2017 1122   CALCIUM 8.4 (L) 12/10/2014 1341   ALKPHOS 43 01/25/2017 1122   ALKPHOS 50 12/10/2014 1341   AST 22 01/25/2017 1122   AST 17 12/10/2014 1341   ALT 17 01/25/2017 1122   ALT 23 12/10/2014 1341   BILITOT 1.1 01/25/2017 1122   BILITOT 0.5 12/10/2014 1341       RADIOGRAPHIC STUDIES: I have personally reviewed the radiological images as listed and agreed with the findings in the report. No results found.   ASSESSMENT & PLAN:  Cancer of hilus of left lung ([place]) Stage IV NED. Patient currently on Alimta maintenance since 2012.JAN 2018-- CT chest abdomen pelvis NED; but bilateral lower lobe infiltrates [see discussion below].   #  Proceed with chemo/ alimta today; platelets- 153. No clinical evidence of progression at this time.  # Hyponatremia- sodium 129;. ? Sec to lasix. Asymptomatic.   # Bil LE edema Grade 1; continue  Lasix/aldactone daily.   # Follow-up with me in 3 weeks' labs chemotherapy.   No orders of the defined types were placed in this encounter.     [name], MD 01/25/2017 4:26 PM

## 2017-01-25 NOTE — Progress Notes (Cosign Needed)
Mitchell Mosley returns to clinic today for consideration of cycle #599, Alimta infusion on the [employer] research study. VS are stable and patient remains afebrile. His weight is 91.6kg (202 lbs), which is in unchanged from last visit.  Reports occasional fatigue. Patient continues to report some wrist pain and states that he last took Vimovo in August 2017. He also continues to have trace edema in his BLE and takes Lasix as needed and Spironolactone daily for the edema. Serum sodium level is 129 mmol/L, Grade 3.  Last Grade 3 hyponatremia was 04/26/16. Per protocol, MD discretion regarding holding treatment.  Dr. Rogue Bussing aware and decided to treat today as planned.  Platelet count WNL today  and patient's glucose is slightly elevated at 105 mg/dL - but this is not a fasting glucose and therefore was not graded. CBC and other chemistries are within acceptable parameters and Dr. Rogue Bussing plans to go ahead and administer Alimta infusion today as scheduled. Adverse events with grade and attribution as follows:    Adverse Event Log Study/Protocol: [name] S130 Cycle: 598 Event Grade Onset Date Resolved Date Drug Name Pemetrexed Attribution Treatment Comments  Edema (mild) Grade 1 05/30/12 ongoing  Possible  Trace edema in ble up to calf  Fatigue Grade1 02/03/16 ongoing  Possible    Hyponatremia Grade 3 01/25/17 ongoing  Possible  Sodium 129  Tendonitis Grade 1 09/21/16 ongoing  Unrelated None   Low Platelet Count Grade 1 12/31/16 01/25/2017  Possible  Platelet count 153,000  Cough Grade 1 12/30/16 01/25/2017  Unrelated  NP cough   [name] R. [place], [place] MS 01/25/17, 2:55PM

## 2017-01-25 NOTE — Assessment & Plan Note (Addendum)
Stage IV NED. Patient currently on Alimta maintenance since 2012.JAN 2018-- CT chest abdomen pelvis NED; but bilateral lower lobe infiltrates [see discussion below].   #  Proceed with chemo/ alimta today; platelets- 153. No clinical evidence of progression at this time.  # Hyponatremia- sodium 129;. ? Sec to lasix. Asymptomatic.   # Bil LE edema Grade 1; continue Lasix/aldactone daily.   # Follow-up with me in 3 weeks' labs chemotherapy.

## 2017-01-29 NOTE — Progress Notes (Signed)
Dr. Rogue Bussing to co-sign Research encounter created by Kenton Kingfisher, RN, MS

## 2017-01-30 ENCOUNTER — Other Ambulatory Visit: Payer: Self-pay | Admitting: Internal Medicine

## 2017-02-08 ENCOUNTER — Other Ambulatory Visit: Payer: Self-pay | Admitting: Internal Medicine

## 2017-02-15 ENCOUNTER — Inpatient Hospital Stay: Payer: BLUE CROSS/BLUE SHIELD

## 2017-02-15 ENCOUNTER — Inpatient Hospital Stay (HOSPITAL_BASED_OUTPATIENT_CLINIC_OR_DEPARTMENT_OTHER): Payer: BLUE CROSS/BLUE SHIELD | Admitting: Internal Medicine

## 2017-02-15 ENCOUNTER — Encounter: Payer: Self-pay | Admitting: *Deleted

## 2017-02-15 ENCOUNTER — Inpatient Hospital Stay: Payer: BLUE CROSS/BLUE SHIELD | Attending: Internal Medicine

## 2017-02-15 VITALS — BP 113/76 | HR 77 | Temp 97.6°F | Resp 18 | Ht 70.0 in | Wt 202.0 lb

## 2017-02-15 DIAGNOSIS — C3402 Malignant neoplasm of left main bronchus: Secondary | ICD-10-CM

## 2017-02-15 DIAGNOSIS — I712 Thoracic aortic aneurysm, without rupture: Secondary | ICD-10-CM | POA: Diagnosis not present

## 2017-02-15 DIAGNOSIS — R600 Localized edema: Secondary | ICD-10-CM | POA: Insufficient documentation

## 2017-02-15 DIAGNOSIS — C3491 Malignant neoplasm of unspecified part of right bronchus or lung: Secondary | ICD-10-CM

## 2017-02-15 DIAGNOSIS — Z006 Encounter for examination for normal comparison and control in clinical research program: Secondary | ICD-10-CM | POA: Diagnosis not present

## 2017-02-15 DIAGNOSIS — C3480 Malignant neoplasm of overlapping sites of unspecified bronchus and lung: Secondary | ICD-10-CM

## 2017-02-15 DIAGNOSIS — C33 Malignant neoplasm of trachea: Secondary | ICD-10-CM

## 2017-02-15 DIAGNOSIS — Z79899 Other long term (current) drug therapy: Secondary | ICD-10-CM | POA: Diagnosis not present

## 2017-02-15 DIAGNOSIS — Z791 Long term (current) use of non-steroidal anti-inflammatories (NSAID): Secondary | ICD-10-CM | POA: Diagnosis not present

## 2017-02-15 DIAGNOSIS — C7802 Secondary malignant neoplasm of left lung: Secondary | ICD-10-CM | POA: Diagnosis not present

## 2017-02-15 DIAGNOSIS — I38 Endocarditis, valve unspecified: Secondary | ICD-10-CM | POA: Diagnosis not present

## 2017-02-15 DIAGNOSIS — Z5111 Encounter for antineoplastic chemotherapy: Secondary | ICD-10-CM | POA: Insufficient documentation

## 2017-02-15 DIAGNOSIS — E871 Hypo-osmolality and hyponatremia: Secondary | ICD-10-CM

## 2017-02-15 LAB — COMPREHENSIVE METABOLIC PANEL
ALBUMIN: 4.2 g/dL (ref 3.5–5.0)
ALK PHOS: 41 U/L (ref 38–126)
ALT: 20 U/L (ref 17–63)
ANION GAP: 7 (ref 5–15)
AST: 25 U/L (ref 15–41)
BILIRUBIN TOTAL: 0.7 mg/dL (ref 0.3–1.2)
BUN: 19 mg/dL (ref 6–20)
CO2: 27 mmol/L (ref 22–32)
Calcium: 9.3 mg/dL (ref 8.9–10.3)
Chloride: 95 mmol/L — ABNORMAL LOW (ref 101–111)
Creatinine, Ser: 1.04 mg/dL (ref 0.61–1.24)
GFR calc Af Amer: >60 mL/min (ref >60)
GFR calc non Af Amer: >60 mL/min (ref >60)
GLUCOSE: 106 mg/dL — AB (ref 65–99)
POTASSIUM: 4 mmol/L (ref 3.5–5.1)
SODIUM: 129 mmol/L — AB (ref 135–145)
TOTAL PROTEIN: 7 g/dL (ref 6.5–8.1)

## 2017-02-15 LAB — CBC WITH DIFFERENTIAL/PLATELET
BASOS ABS: 0 10*3/uL (ref 0–0.1)
BASOS PCT: 1 %
EOS ABS: 0.3 10*3/uL (ref 0–0.7)
Eosinophils Relative: 5 %
HEMATOCRIT: 39.5 % — AB (ref 40.0–52.0)
HEMOGLOBIN: 13.8 g/dL (ref 13.0–18.0)
Lymphocytes Relative: 33 %
Lymphs Abs: 1.8 10*3/uL (ref 1.0–3.6)
MCH: 31.8 pg (ref 26.0–34.0)
MCHC: 35 g/dL (ref 32.0–36.0)
MCV: 90.8 fL (ref 80.0–100.0)
Monocytes Absolute: 0.5 10*3/uL (ref 0.2–1.0)
Monocytes Relative: 9 %
Neutro Abs: 2.7 10*3/uL (ref 1.4–6.5)
Neutrophils Relative %: 52 %
Platelets: 157 10*3/uL (ref 150–440)
RBC: 4.35 MIL/uL — ABNORMAL LOW (ref 4.40–5.90)
RDW: 13.2 % (ref 11.5–14.5)
WBC: 5.3 10*3/uL (ref 3.8–10.6)

## 2017-02-15 MED ORDER — HEPARIN SOD (PORK) LOCK FLUSH 100 UNIT/ML IV SOLN
500.0000 [IU] | Freq: Once | INTRAVENOUS | Status: AC | PRN
  Administered 2017-02-15: 500 [IU]
  Filled 2017-02-15 (×2): qty 5

## 2017-02-15 MED ORDER — SODIUM CHLORIDE 0.9 % IV SOLN
500.0000 mg/m2 | Freq: Once | INTRAVENOUS | Status: AC
  Administered 2017-02-15: 1050 mg via INTRAVENOUS
  Filled 2017-02-15: qty 42

## 2017-02-15 MED ORDER — SODIUM CHLORIDE 0.9 % IJ SOLN
10.0000 mL | INTRAMUSCULAR | Status: DC | PRN
  Administered 2017-02-15: 10 mL
  Filled 2017-02-15: qty 10

## 2017-02-15 MED ORDER — CYANOCOBALAMIN 1000 MCG/ML IJ SOLN
1000.0000 ug | Freq: Once | INTRAMUSCULAR | Status: AC
  Administered 2017-02-15: 1000 ug via INTRAMUSCULAR
  Filled 2017-02-15: qty 1

## 2017-02-15 MED ORDER — SODIUM CHLORIDE 0.9 % IV SOLN
Freq: Once | INTRAVENOUS | Status: AC
  Administered 2017-02-15: 16:00:00 via INTRAVENOUS
  Filled 2017-02-15: qty 4

## 2017-02-15 MED ORDER — SODIUM CHLORIDE 0.9 % IV SOLN
Freq: Once | INTRAVENOUS | Status: AC
  Administered 2017-02-15: 16:00:00 via INTRAVENOUS
  Filled 2017-02-15: qty 1000

## 2017-02-15 NOTE — Progress Notes (Signed)
Patient here for lung cancer follow-up. Pt would like to go to [place] this summer. He is inquiring about contraindications regarding vaccinations.

## 2017-02-15 NOTE — Progress Notes (Signed)
[place] OFFICE PROGRESS NOTE  Patient Care Team: [name], [name], stage 4 [facility])   Staging form: Lung, AJCC 7th Edition     Clinical stage from 05/24/2015: Stage IV ([place], NX, [name], MD on 05/24/2015    Oncology History   # 2012- METASTATIC ADENO CA of LEFT LUNG [acinar pattern] s/p MED LN Bx [NEG- EGFR/K-ras/ALK mutation];[name]; [name]; CT JULY 2016- NED; JAN 2018- NED  # Thoracic Aneurysm [4cm- stable]     Cancer of hilus of left lung ([place])   07/20/2016 Initial Diagnosis    Cancer of hilus of left lung ([place])       INTERVAL HISTORY:  [name] with above history of metastatic adenocarcinoma the lung currently on maintenance Alimta since June 2012 is here for follow-up prior to his chemotherapy;  Patient denies any significant cough. Denies any significant shortness of breath. .Appetite is good. No weight loss.Denies any headaches. Denies any vision changes. He is very active. He has been on meloxicam for his wrist/ feet pain.    REVIEW OF SYSTEMS:  A complete 10 point review of system is done which is negative except mentioned above/history of present illness.   PAST MEDICAL HISTORY :  Past Medical History:  Diagnosis Date  . Allergy   . Lung cancer ([place])   . Mild valvular heart disease Nov. 2014   per 2 D Echocardiogram done for persistent leg edema, monitored by cardiology  . Peripheral edema     PAST SURGICAL HISTORY :   Past Surgical History:  Procedure Laterality Date  . ELBOW SURGERY    . Left anterior thoracotomy with biopsy  March 2012  . PORTACATH PLACEMENT  March 2012  . TONSILLECTOMY      FAMILY HISTORY :   Family History  Problem Relation Age of Onset  . Leukemia Father   . Diabetes Mother      SOCIAL HISTORY:   Social History  Substance Use Topics  . Smoking status: Never Smoker  . Smokeless tobacco: Never Used  . Alcohol use 0.0 oz/week     Comment: "social drinker"     ALLERGIES:  is allergic to penicillins.  MEDICATIONS:  Current Outpatient Prescriptions  Medication Sig Dispense Refill  . folic acid (FOLVITE) 1 MG tablet TAKE 1 TABLET (1 MG TOTAL) BY MOUTH DAILY. 30 tablet 2  . furosemide (LASIX) 20 MG tablet TAKE 1 TABLET (20 MG TOTAL) BY MOUTH DAILY. ONE TABLET DAILY AS NEEDED FOR SWELLING 90 tablet 1  . meloxicam (MOBIC) 7.5 MG tablet Take 7.5 mg by mouth daily.     . Multiple Vitamin (MULTIVITAMIN) capsule Take 1 capsule by mouth daily. Reported on 02/24/2016    . Omega-3 Fatty Acids (FISH OIL) 1000 MG CAPS Take 1 capsule by mouth daily. Reported on 02/24/2016    . spironolactone (ALDACTONE) 25 MG tablet Take 1 tablet (25 mg total) by mouth daily. 90 tablet 3  . vitamin B-12 (CYANOCOBALAMIN) 1000 MCG tablet Take 1,000 mcg by mouth daily.    Marland Kitchen CIALIS 20 MG tablet TAKE 1/2 TO 1 TABLET BY MOUTH EVERY 2 DAYS AS NEEDED (Patient not taking: Reported on 01/25/2017) 10 tablet 12  . ondansetron (ZOFRAN-ODT) 4 MG disintegrating tablet Take 1 tablet (4 mg total) by mouth every 4 (four)  hours as needed for nausea or vomiting. (Patient not taking: Reported on 01/25/2017) 20 tablet 2  . senna (SENOKOT) 8.6 MG tablet Take 1 tablet by mouth daily. Take as needed around time of chemotherapy     No current facility-administered medications for this visit.    Facility-Administered Medications Ordered in Other Visits  Medication Dose Route Frequency Provider Last Rate Last Dose  . sodium chloride 0.9 % injection 10 mL  10 mL Intracatheter PRN [name], MD   10 mL at 05/27/15 1543  . sodium chloride 0.9 % injection 10 mL  10 mL Intracatheter PRN [name], MD   10 mL at 06/17/15 1510  . sodium chloride 0.9 % injection 10 mL  10 mL Intravenous PRN [name], MD   10 mL at  07/08/15 1340    PHYSICAL EXAMINATION: ECOG PERFORMANCE STATUS: 0 - Asymptomatic  BP 113/76 (BP Location: Left Arm, Patient Position: Sitting)   Pulse 77   Temp 97.6 F (36.4 C) (Tympanic)   Resp 18   Ht _0  (1.778 m)   Wt 202 lb (91.6 kg)   BMI 28.98 kg/m   Filed Weights   02/15/17 1445  Weight: 202 lb (91.6 kg)     GENERAL: Well-nourished well-developed; Alert, [name]; good dentition  NECK: supple, [name], [name]; 1+ bilateral lower extremity swelling. ABDOMEN:abdomen soft, [name];  LABORATORY DATA:  I have reviewed the data as listed    Component Value Date/Time   NA 129 (L) 02/15/2017 1130   NA 138 12/10/2014 1341   K 4.0 02/15/2017 1130   K 3.6 12/10/2014 1341   CL 95 (L) 02/15/2017 1130   CL 102 12/10/2014 1341   CO2 27 02/15/2017 1130   CO2 30 12/10/2014 1341   GLUCOSE 106 (H) 02/15/2017 1130   GLUCOSE 160 (H) 12/10/2014 1341   BUN 19 02/15/2017 1130   BUN 21 (H) 12/10/2014 1341   CREATININE 1.04 02/15/2017 1130   CREATININE 1.10 03/25/2015 1453   CALCIUM 9.3 02/15/2017 1130   CALCIUM 8.4 (L) 12/10/2014 1341   PROT 7.0 02/15/2017 1130   PROT 6.6 12/10/2014 1341   ALBUMIN 4.2 02/15/2017 1130   ALBUMIN 3.4 12/10/2014 1341   AST 25 02/15/2017 1130   AST 17 12/10/2014 1341   ALT 20 02/15/2017 1130   ALT 23 12/10/2014 1341   ALKPHOS 41 02/15/2017 1130   ALKPHOS 50 12/10/2014 1341   BILITOT 0.7 02/15/2017 1130   BILITOT 0.5 12/10/2014 1341   GFRNONAA >60  02/15/2017 1130   GFRNONAA >60 03/25/2015 1453   GFRAA >60 02/15/2017 1130   GFRAA >60 03/25/2015 1453    No results found for: SPEP, UPEP  Lab Results  Component Value Date   WBC 5.3 02/15/2017   NEUTROABS 2.7 02/15/2017   HGB 13.8 02/15/2017   HCT 39.5 (L) 02/15/2017   MCV 90.8 02/15/2017   PLT 157 02/15/2017      Chemistry      Component Value Date/Time   NA 129 (L) 02/15/2017 1130   NA  138 12/10/2014 1341   K 4.0 02/15/2017 1130   K 3.6 12/10/2014 1341   CL 95 (L) 02/15/2017 1130   CL 102 12/10/2014 1341   CO2 27 02/15/2017 1130   CO2 30 12/10/2014 1341   BUN 19 02/15/2017 1130   BUN 21 (H) 12/10/2014 1341   CREATININE 1.04 02/15/2017 1130   CREATININE 1.10 03/25/2015 1453      Component Value Date/Time   CALCIUM 9.3 02/15/2017 1130   CALCIUM 8.4 (L) 12/10/2014 1341   ALKPHOS 41 02/15/2017 1130   ALKPHOS 50 12/10/2014 1341   AST 25 02/15/2017 1130   AST 17 12/10/2014 1341   ALT 20 02/15/2017 1130   ALT 23 12/10/2014 1341   BILITOT 0.7 02/15/2017 1130   BILITOT 0.5 12/10/2014 1341       RADIOGRAPHIC STUDIES: I have personally reviewed the radiological images as listed and agreed with the findings in the report. No results found.   ASSESSMENT & PLAN:  Cancer of hilus of left lung ([place]) Stage IV NED. Patient currently on Alimta maintenance since 2012. JAN 2018-- CT chest abdomen pelvis NED.   #  Proceed with chemo/ alimta today; platelets- 153. No clinical evidence of progression at this time.  # Hyponatremia- sodium 129;. ? Sec to lasix vs meloxicam. Asymptomatic. Pt will try to come off meloxicam x3 weeks; to see if the sodium levels improve.   # Bil LE edema Grade 1; continue Lasix/aldactone daily.   # Follow-up with me in 3 weeks' labs chemotherapy.   No orders of the defined types were placed in this encounter.     [name], MD 02/16/2017 12:20 PM

## 2017-02-15 NOTE — Progress Notes (Signed)
Mitchell Mosley returns to clinic today for consideration of cycle #600, Alimta infusion on the [employer] research study. VS are stable and patient remains afebrile. His weight is stable at 91.63kg (202 lbs).  Reports occasional fatigue, less frequent than before. Patient continues to report some wrist pain which he says is getting better. He also continues to have trace edema in his BLE and takes Lasix and Spironolactone daily for the edema. Serum sodium level is 129 mmol/L, Grade 3 again. He discussed with Dr. Rogue Bussing that he will stop taking Mobic for the next 3 weeks to see if his sodium level improves. Platelet count WNL today  and patient's glucose is slightly elevated at 106 mg/dL - but this is not a fasting glucose and therefore was not graded. CBC and other chemistries are within acceptable parameters and Dr. Rogue Bussing plans to go ahead and administer Alimta infusion today as scheduled. Patient also discussed with Dr. Rogue Bussing that he is planning a trip to [place] and wants to take the necessary immunizations prior to his Trip. Dr. B referred him back to his GP for this information. Adverse events with grade and attribution as follows:    Adverse Event Log Study/Protocol: [name] S130 Cycle: 599 Event Grade Onset Date Resolved Date Drug Name Pemetrexed Attribution Treatment Comments  Edema (mild) Grade 1 05/30/12 ongoing  Possible  Trace edema in ble up to calf  Fatigue Grade1 02/03/16 ongoing  Possible    Hyponatremia Grade 3 01/25/17 ongoing  Possible  Sodium 129  Tendonitis Grade 1 09/21/16 ongoing  Unrelated None   Yolande Jolly, BSN, [place], OCN 02/15/2017 3:31 PM

## 2017-02-15 NOTE — Assessment & Plan Note (Addendum)
Stage IV NED. Patient currently on Alimta maintenance since 2012. JAN 2018-- CT chest abdomen pelvis NED.   #  Proceed with chemo/ alimta today; platelets- 153. No clinical evidence of progression at this time.  # Hyponatremia- sodium 129;. ? Sec to lasix vs meloxicam. Asymptomatic. Pt will try to come off meloxicam x3 weeks; to see if the sodium levels improve.   # Bil LE edema Grade 1; continue Lasix/aldactone daily.   # Follow-up with me in 3 weeks' labs chemotherapy.

## 2017-03-08 ENCOUNTER — Inpatient Hospital Stay (HOSPITAL_BASED_OUTPATIENT_CLINIC_OR_DEPARTMENT_OTHER): Payer: BLUE CROSS/BLUE SHIELD | Admitting: Internal Medicine

## 2017-03-08 ENCOUNTER — Inpatient Hospital Stay: Payer: BLUE CROSS/BLUE SHIELD | Attending: Internal Medicine

## 2017-03-08 ENCOUNTER — Inpatient Hospital Stay: Payer: BLUE CROSS/BLUE SHIELD

## 2017-03-08 ENCOUNTER — Encounter: Payer: Self-pay | Admitting: *Deleted

## 2017-03-08 VITALS — BP 120/79 | HR 78 | Temp 97.0°F | Resp 18

## 2017-03-08 VITALS — BP 119/90 | HR 81 | Temp 96.4°F | Resp 18 | Wt 204.0 lb

## 2017-03-08 DIAGNOSIS — C3402 Malignant neoplasm of left main bronchus: Secondary | ICD-10-CM

## 2017-03-08 DIAGNOSIS — Z5111 Encounter for antineoplastic chemotherapy: Secondary | ICD-10-CM | POA: Insufficient documentation

## 2017-03-08 DIAGNOSIS — C7802 Secondary malignant neoplasm of left lung: Secondary | ICD-10-CM | POA: Insufficient documentation

## 2017-03-08 DIAGNOSIS — K760 Fatty (change of) liver, not elsewhere classified: Secondary | ICD-10-CM | POA: Diagnosis not present

## 2017-03-08 DIAGNOSIS — Z79899 Other long term (current) drug therapy: Secondary | ICD-10-CM

## 2017-03-08 DIAGNOSIS — I712 Thoracic aortic aneurysm, without rupture: Secondary | ICD-10-CM | POA: Diagnosis not present

## 2017-03-08 DIAGNOSIS — C33 Malignant neoplasm of trachea: Secondary | ICD-10-CM

## 2017-03-08 DIAGNOSIS — R600 Localized edema: Secondary | ICD-10-CM

## 2017-03-08 DIAGNOSIS — Z006 Encounter for examination for normal comparison and control in clinical research program: Secondary | ICD-10-CM | POA: Diagnosis not present

## 2017-03-08 DIAGNOSIS — C3491 Malignant neoplasm of unspecified part of right bronchus or lung: Secondary | ICD-10-CM | POA: Insufficient documentation

## 2017-03-08 DIAGNOSIS — C3480 Malignant neoplasm of overlapping sites of unspecified bronchus and lung: Secondary | ICD-10-CM

## 2017-03-08 DIAGNOSIS — E871 Hypo-osmolality and hyponatremia: Secondary | ICD-10-CM

## 2017-03-08 LAB — COMPREHENSIVE METABOLIC PANEL
ALK PHOS: 39 U/L (ref 38–126)
ALT: 19 U/L (ref 17–63)
ANION GAP: 9 (ref 5–15)
AST: 24 U/L (ref 15–41)
Albumin: 4.1 g/dL (ref 3.5–5.0)
BUN: 24 mg/dL — ABNORMAL HIGH (ref 6–20)
CO2: 25 mmol/L (ref 22–32)
Calcium: 9.3 mg/dL (ref 8.9–10.3)
Chloride: 99 mmol/L — ABNORMAL LOW (ref 101–111)
Creatinine, Ser: 0.94 mg/dL (ref 0.61–1.24)
GFR calc Af Amer: >60 mL/min (ref >60)
GLUCOSE: 108 mg/dL — AB (ref 65–99)
POTASSIUM: 4.1 mmol/L (ref 3.5–5.1)
SODIUM: 133 mmol/L — AB (ref 135–145)
TOTAL PROTEIN: 7.1 g/dL (ref 6.5–8.1)
Total Bilirubin: 0.9 mg/dL (ref 0.3–1.2)

## 2017-03-08 LAB — URINALYSIS, COMPLETE (UACMP) WITH MICROSCOPIC
BACTERIA UA: NONE SEEN
Bilirubin Urine: NEGATIVE
GLUCOSE, UA: NEGATIVE mg/dL
Hgb urine dipstick: NEGATIVE
Ketones, ur: NEGATIVE mg/dL
Leukocytes, UA: NEGATIVE
Nitrite: NEGATIVE
Protein, ur: NEGATIVE mg/dL
SQUAMOUS EPITHELIAL / LPF: NONE SEEN
Specific Gravity, Urine: 1.005 (ref 1.005–1.030)
WBC, UA: NONE SEEN WBC/hpf (ref 0–5)
pH: 6 (ref 5.0–8.0)

## 2017-03-08 LAB — CBC WITH DIFFERENTIAL/PLATELET
Basophils Absolute: 0 10*3/uL (ref 0–0.1)
Basophils Relative: 1 %
EOS ABS: 0.1 10*3/uL (ref 0–0.7)
EOS PCT: 2 %
HCT: 40.4 % (ref 40.0–52.0)
Hemoglobin: 14 g/dL (ref 13.0–18.0)
LYMPHS ABS: 1.6 10*3/uL (ref 1.0–3.6)
Lymphocytes Relative: 33 %
MCH: 31.8 pg (ref 26.0–34.0)
MCHC: 34.8 g/dL (ref 32.0–36.0)
MCV: 91.4 fL (ref 80.0–100.0)
MONOS PCT: 9 %
Monocytes Absolute: 0.5 10*3/uL (ref 0.2–1.0)
Neutro Abs: 2.7 10*3/uL (ref 1.4–6.5)
Neutrophils Relative %: 55 %
PLATELETS: 175 10*3/uL (ref 150–440)
RBC: 4.42 MIL/uL (ref 4.40–5.90)
RDW: 13.5 % (ref 11.5–14.5)
WBC: 5 10*3/uL (ref 3.8–10.6)

## 2017-03-08 LAB — MAGNESIUM: MAGNESIUM: 1.9 mg/dL (ref 1.7–2.4)

## 2017-03-08 MED ORDER — SODIUM CHLORIDE 0.9 % IV SOLN
Freq: Once | INTRAVENOUS | Status: AC
  Administered 2017-03-08: 15:00:00 via INTRAVENOUS
  Filled 2017-03-08: qty 4

## 2017-03-08 MED ORDER — SODIUM CHLORIDE 0.9 % IV SOLN
Freq: Once | INTRAVENOUS | Status: AC
  Administered 2017-03-08: 15:00:00 via INTRAVENOUS
  Filled 2017-03-08: qty 1000

## 2017-03-08 MED ORDER — SODIUM CHLORIDE 0.9 % IV SOLN
500.0000 mg/m2 | Freq: Once | INTRAVENOUS | Status: AC
  Administered 2017-03-08: 1050 mg via INTRAVENOUS
  Filled 2017-03-08: qty 42

## 2017-03-08 MED ORDER — HEPARIN SOD (PORK) LOCK FLUSH 100 UNIT/ML IV SOLN
500.0000 [IU] | Freq: Once | INTRAVENOUS | Status: AC | PRN
  Administered 2017-03-08: 500 [IU]
  Filled 2017-03-08: qty 5

## 2017-03-08 NOTE — Progress Notes (Signed)
Patient offers no complaints today. 

## 2017-03-08 NOTE — Progress Notes (Cosign Needed)
Mitchell Mosley returns to clinic today for consideration of cycle #601, Alimta infusion on the [employer] research study. VS are stable and patient remains afebrile. His weight is stable at 92.5kg (202 lbs).  Reports occasional fatigue, less frequent than before. Patient continues to report some wrist pain which he says is getting better. He also continues to have trace edema in his BLE and takes Lasix and Spironolactone daily for the edema. Serum sodium level is 133 mmol/L, Grade 1. Platelet count WNL today  and patient's glucose is slightly elevated at 108 mg/dL - but this is not a fasting glucose and therefore was not graded. CBC and other chemistries are within acceptable parameters and Dr. Rogue Bussing plans to go ahead and administer Alimta infusion today as scheduled.  Patient denies any new adverse events since last visit.  Adverse events with grade and attribution as follows:    Adverse Event Log Study/Protocol: Eli Lilly S130 Cycle: 600 Event Grade Onset Date Resolved Date Drug Name Pemetrexed Attribution Treatment Comments  Edema (mild) Grade 1 05/30/12 ongoing  Possible  Trace edema in ble up to calf  Fatigue Grade1 02/03/16 ongoing  Possible    Hyponatremia Grade 3 01/25/17 03/08/17  Possible  Sodium 129  Tendonitis Grade 1 09/21/16 ongoing  Unrelated None   Hyponatremia Grade 1 03/08/17   Possible  Sodium 133  Raynelle Dick, BSN  03/08/2017 2:34 PM

## 2017-03-08 NOTE — Progress Notes (Signed)
[facility] OFFICE PROGRESS NOTE  Patient Care Team: [name], [name], stage 4 [facility])   Staging form: Lung, AJCC 7th Edition     Clinical stage from 05/24/2015: Stage IV ([place], NX, [name], MD on 05/24/2015    Oncology History   # 2012- METASTATIC ADENO CA of LEFT LUNG [acinar pattern] s/p MED LN Bx [NEG- EGFR/K-ras/ALK mutation];[name]; [name]; CT JULY 2016- NED; JAN 2018- NED  # Thoracic Aneurysm [4cm- stable]     Cancer of hilus of left lung ([place])   07/20/2016 Initial Diagnosis    Cancer of hilus of left lung ([place])       INTERVAL HISTORY:  [name] with above history of metastatic adenocarcinoma the lung currently on maintenance Alimta since June 2012 is here for follow-up prior to his chemotherapy;  Patient denies any significant cough. Denies any significant shortness of breath. .Appetite is good. No weight loss.Denies any headaches. Denies any vision changes. He is very active. He has been on meloxicam for his wrist/ feet pain.    REVIEW OF SYSTEMS:  A complete 10 point review of system is done which is negative except mentioned above/history of present illness.   PAST MEDICAL HISTORY :  Past Medical History:  Diagnosis Date  . Allergy   . Lung cancer ([facility])   . Mild valvular heart disease Nov. 2014   per 2 D Echocardiogram done for persistent leg edema, monitored by cardiology  . Peripheral edema     PAST SURGICAL HISTORY :   Past Surgical History:  Procedure Laterality Date  . ELBOW SURGERY    . Left anterior thoracotomy with biopsy  March 2012  . PORTACATH PLACEMENT  March 2012  . TONSILLECTOMY      FAMILY HISTORY :   Family History  Problem Relation Age of Onset  . Leukemia Father   . Diabetes Mother      SOCIAL HISTORY:   Social History  Substance Use Topics  . Smoking status: Never Smoker  . Smokeless tobacco: Never Used  . Alcohol use 0.0 oz/week     Comment: "social drinker"     ALLERGIES:  is allergic to penicillins.  MEDICATIONS:  Current Outpatient Prescriptions  Medication Sig Dispense Refill  . folic acid (FOLVITE) 1 MG tablet TAKE 1 TABLET (1 MG TOTAL) BY MOUTH DAILY. 30 tablet 2  . furosemide (LASIX) 20 MG tablet TAKE 1 TABLET (20 MG TOTAL) BY MOUTH DAILY. ONE TABLET DAILY AS NEEDED FOR SWELLING 90 tablet 1  . meloxicam (MOBIC) 7.5 MG tablet Take 7.5 mg by mouth daily.     . Multiple Vitamin (MULTIVITAMIN) capsule Take 1 capsule by mouth daily. Reported on 02/24/2016    . Omega-3 Fatty Acids (FISH OIL) 1000 MG CAPS Take 1 capsule by mouth daily. Reported on 02/24/2016    . senna (SENOKOT) 8.6 MG tablet Take 1 tablet by mouth daily. Take as needed around time of chemotherapy    . spironolactone (ALDACTONE) 25 MG tablet Take 1 tablet (25 mg total) by mouth daily. 90 tablet 3  . vitamin B-12 (CYANOCOBALAMIN) 1000 MCG tablet Take 1,000 mcg by mouth daily.    Marland Kitchen CIALIS 20 MG tablet TAKE 1/2 TO 1 TABLET BY MOUTH EVERY 2 DAYS AS NEEDED (Patient not taking: Reported on 01/25/2017)  10 tablet 12  . ondansetron (ZOFRAN-ODT) 4 MG disintegrating tablet Take 1 tablet (4 mg total) by mouth every 4 (four) hours as needed for nausea or vomiting. (Patient not taking: Reported on 01/25/2017) 20 tablet 2   No current facility-administered medications for this visit.    Facility-Administered Medications Ordered in Other Visits  Medication Dose Route Frequency Provider Last Rate Last Dose  . sodium chloride 0.9 % injection 10 mL  10 mL Intracatheter PRN [name], MD   10 mL at 05/27/15 1543  . sodium chloride 0.9 % injection 10 mL  10 mL Intracatheter PRN [name], MD   10 mL at 06/17/15 1510  . sodium chloride 0.9 % injection 10 mL  10 mL Intravenous PRN [name], MD   10 mL at  07/08/15 1340    PHYSICAL EXAMINATION: ECOG PERFORMANCE STATUS: 0 - Asymptomatic  BP 119/90 (BP Location: Left Arm, Patient Position: Sitting)   Pulse 81   Temp (!) 96.4 F (35.8 C) (Tympanic)   Resp 18   Wt 204 lb (92.5 kg)   BMI 29.27 kg/m   Filed Weights   03/08/17 1407  Weight: 204 lb (92.5 kg)     GENERAL: Well-nourished well-developed; Alert, [name]; good dentition  NECK: supple, [name], [name]; 1+ bilateral lower extremity swelling. ABDOMEN:abdomen soft, [name];  LABORATORY DATA:  I have reviewed the data as listed    Component Value Date/Time   NA 133 (L) 03/08/2017 1110   NA 138 12/10/2014 1341   K 4.1 03/08/2017 1110   K 3.6 12/10/2014 1341   CL 99 (L) 03/08/2017 1110   CL 102 12/10/2014 1341   CO2 25 03/08/2017 1110   CO2 30 12/10/2014 1341   GLUCOSE 108 (H) 03/08/2017 1110   GLUCOSE 160 (H) 12/10/2014 1341   BUN 24 (H) 03/08/2017 1110   BUN 21 (H) 12/10/2014 1341   CREATININE 0.94 03/08/2017 1110   CREATININE 1.10 03/25/2015 1453   CALCIUM 9.3 03/08/2017 1110   CALCIUM 8.4 (L) 12/10/2014 1341   PROT 7.1 03/08/2017 1110   PROT 6.6 12/10/2014 1341   ALBUMIN 4.1 03/08/2017 1110   ALBUMIN 3.4 12/10/2014 1341   AST 24 03/08/2017 1110   AST 17 12/10/2014 1341   ALT 19 03/08/2017 1110   ALT 23 12/10/2014 1341   ALKPHOS 39 03/08/2017 1110   ALKPHOS 50 12/10/2014 1341   BILITOT 0.9 03/08/2017 1110   BILITOT 0.5 12/10/2014 1341   GFRNONAA >60 03/08/2017 1110    GFRNONAA >60 03/25/2015 1453   GFRAA >60 03/08/2017 1110   GFRAA >60 03/25/2015 1453    No results found for: SPEP, UPEP  Lab Results  Component Value Date   WBC 5.0 03/08/2017   NEUTROABS 2.7 03/08/2017   HGB 14.0 03/08/2017   HCT 40.4 03/08/2017   MCV 91.4 03/08/2017   PLT 175 03/08/2017      Chemistry      Component Value Date/Time   NA 133 (L) 03/08/2017 1110   NA 138 12/10/2014 1341   K  4.1 03/08/2017 1110   K 3.6 12/10/2014 1341   CL 99 (L) 03/08/2017 1110   CL 102 12/10/2014 1341   CO2 25 03/08/2017 1110   CO2 30 12/10/2014 1341   BUN 24 (H) 03/08/2017 1110   BUN 21 (H) 12/10/2014 1341   CREATININE 0.94 03/08/2017 1110   CREATININE 1.10 03/25/2015 1453      Component Value Date/Time   CALCIUM 9.3 03/08/2017 1110   CALCIUM 8.4 (L) 12/10/2014 1341   ALKPHOS 39 03/08/2017 1110   ALKPHOS 50 12/10/2014 1341   AST 24 03/08/2017 1110   AST 17 12/10/2014 1341   ALT 19 03/08/2017 1110   ALT 23 12/10/2014 1341   BILITOT 0.9 03/08/2017 1110   BILITOT 0.5 12/10/2014 1341       RADIOGRAPHIC STUDIES: I have personally reviewed the radiological images as listed and agreed with the findings in the report. No results found.   ASSESSMENT & PLAN:  Cancer of hilus of left lung ([place]) Stage IV NED. Patient currently on Alimta maintenance since 2012. JAN 2018-- CT chest abdomen pelvis NED.  No clinical evidence of progression at this time.  #  Proceed with chemo/ alimta today; platelets- 175. Labs today reviewed;  acceptable for treatment today.   # Hyponatremia- sodium 133. improved. Monitor for now.   # Bil LE edema Grade 1; continue Lasix/aldactone daily.   # Follow-up with me in 3 weeks' labs chemotherapy. Discussed with clinical trials RN; CT scans prior.   No orders of the defined types were placed in this encounter.     [name], MD 03/08/2017 2:23 PM

## 2017-03-08 NOTE — Assessment & Plan Note (Addendum)
Stage IV NED. Patient currently on Alimta maintenance since 2012. JAN 2018-- CT chest abdomen pelvis NED.  No clinical evidence of progression at this time.  #  Proceed with chemo/ alimta today; platelets- 175. Labs today reviewed;  acceptable for treatment today.   # Hyponatremia- sodium 133. improved. Monitor for now.   # Bil LE edema Grade 1; continue Lasix/aldactone daily.   # Follow-up with me in 3 weeks' labs chemotherapy. Discussed with clinical trials RN; CT scans prior.

## 2017-03-25 ENCOUNTER — Ambulatory Visit
Admission: RE | Admit: 2017-03-25 | Discharge: 2017-03-25 | Disposition: A | Discharge status: Home / Self Care | Payer: BLUE CROSS/BLUE SHIELD | Source: Ambulatory Visit | Attending: Internal Medicine | Admitting: Internal Medicine

## 2017-03-25 DIAGNOSIS — I700 Atherosclerosis of aorta: Secondary | ICD-10-CM | POA: Diagnosis not present

## 2017-03-25 DIAGNOSIS — K760 Fatty (change of) liver, not elsewhere classified: Secondary | ICD-10-CM | POA: Diagnosis not present

## 2017-03-25 DIAGNOSIS — C3402 Malignant neoplasm of left main bronchus: Secondary | ICD-10-CM | POA: Diagnosis not present

## 2017-03-25 DIAGNOSIS — I77810 Thoracic aortic ectasia: Secondary | ICD-10-CM | POA: Insufficient documentation

## 2017-03-25 DIAGNOSIS — R918 Other nonspecific abnormal finding of lung field: Secondary | ICD-10-CM | POA: Insufficient documentation

## 2017-03-25 DIAGNOSIS — C3490 Malignant neoplasm of unspecified part of unspecified bronchus or lung: Secondary | ICD-10-CM | POA: Diagnosis not present

## 2017-03-25 MED ORDER — IOPAMIDOL (ISOVUE-300) INJECTION 61%
100.0000 mL | Freq: Once | INTRAVENOUS | Status: AC | PRN
  Administered 2017-03-25: 100 mL via INTRAVENOUS

## 2017-03-29 ENCOUNTER — Inpatient Hospital Stay (HOSPITAL_BASED_OUTPATIENT_CLINIC_OR_DEPARTMENT_OTHER): Payer: BLUE CROSS/BLUE SHIELD | Admitting: Internal Medicine

## 2017-03-29 ENCOUNTER — Encounter: Payer: Self-pay | Admitting: *Deleted

## 2017-03-29 ENCOUNTER — Inpatient Hospital Stay: Payer: BLUE CROSS/BLUE SHIELD

## 2017-03-29 VITALS — BP 117/72 | HR 75 | Temp 97.8°F | Resp 18

## 2017-03-29 DIAGNOSIS — C3491 Malignant neoplasm of unspecified part of right bronchus or lung: Secondary | ICD-10-CM | POA: Diagnosis not present

## 2017-03-29 DIAGNOSIS — C7802 Secondary malignant neoplasm of left lung: Secondary | ICD-10-CM | POA: Diagnosis not present

## 2017-03-29 DIAGNOSIS — E871 Hypo-osmolality and hyponatremia: Secondary | ICD-10-CM | POA: Diagnosis not present

## 2017-03-29 DIAGNOSIS — K760 Fatty (change of) liver, not elsewhere classified: Secondary | ICD-10-CM | POA: Diagnosis not present

## 2017-03-29 DIAGNOSIS — Z006 Encounter for examination for normal comparison and control in clinical research program: Secondary | ICD-10-CM

## 2017-03-29 DIAGNOSIS — Z79899 Other long term (current) drug therapy: Secondary | ICD-10-CM

## 2017-03-29 DIAGNOSIS — I712 Thoracic aortic aneurysm, without rupture: Secondary | ICD-10-CM | POA: Diagnosis not present

## 2017-03-29 DIAGNOSIS — C33 Malignant neoplasm of trachea: Secondary | ICD-10-CM

## 2017-03-29 DIAGNOSIS — Z5111 Encounter for antineoplastic chemotherapy: Secondary | ICD-10-CM | POA: Diagnosis not present

## 2017-03-29 DIAGNOSIS — R600 Localized edema: Secondary | ICD-10-CM | POA: Diagnosis not present

## 2017-03-29 DIAGNOSIS — C3480 Malignant neoplasm of overlapping sites of unspecified bronchus and lung: Principal | ICD-10-CM

## 2017-03-29 DIAGNOSIS — C3402 Malignant neoplasm of left main bronchus: Secondary | ICD-10-CM | POA: Diagnosis not present

## 2017-03-29 LAB — COMPREHENSIVE METABOLIC PANEL
ALBUMIN: 4.2 g/dL (ref 3.5–5.0)
ALT: 17 U/L (ref 17–63)
ANION GAP: 6 (ref 5–15)
AST: 23 U/L (ref 15–41)
Alkaline Phosphatase: 43 U/L (ref 38–126)
BILIRUBIN TOTAL: 0.8 mg/dL (ref 0.3–1.2)
BUN: 14 mg/dL (ref 6–20)
CHLORIDE: 96 mmol/L — AB (ref 101–111)
CO2: 27 mmol/L (ref 22–32)
Calcium: 9.1 mg/dL (ref 8.9–10.3)
Creatinine, Ser: 0.88 mg/dL (ref 0.61–1.24)
GFR calc Af Amer: >60 mL/min (ref >60)
GFR calc non Af Amer: >60 mL/min (ref >60)
GLUCOSE: 101 mg/dL — AB (ref 65–99)
POTASSIUM: 3.8 mmol/L (ref 3.5–5.1)
SODIUM: 129 mmol/L — AB (ref 135–145)
TOTAL PROTEIN: 7.2 g/dL (ref 6.5–8.1)

## 2017-03-29 LAB — CBC WITH DIFFERENTIAL/PLATELET
BASOS ABS: 0 10*3/uL (ref 0–0.1)
Basophils Relative: 1 %
Eosinophils Absolute: 0.1 10*3/uL (ref 0–0.7)
Eosinophils Relative: 3 %
HEMATOCRIT: 39.8 % — AB (ref 40.0–52.0)
HEMOGLOBIN: 13.8 g/dL (ref 13.0–18.0)
LYMPHS PCT: 34 %
Lymphs Abs: 1.5 10*3/uL (ref 1.0–3.6)
MCH: 31.9 pg (ref 26.0–34.0)
MCHC: 34.8 g/dL (ref 32.0–36.0)
MCV: 91.6 fL (ref 80.0–100.0)
MONO ABS: 0.4 10*3/uL (ref 0.2–1.0)
Monocytes Relative: 8 %
NEUTROS ABS: 2.4 10*3/uL (ref 1.4–6.5)
Neutrophils Relative %: 54 %
PLATELETS: 160 10*3/uL (ref 150–440)
RBC: 4.34 MIL/uL — AB (ref 4.40–5.90)
RDW: 13.6 % (ref 11.5–14.5)
WBC: 4.5 10*3/uL (ref 3.8–10.6)

## 2017-03-29 MED ORDER — SODIUM CHLORIDE 0.9 % IV SOLN
Freq: Once | INTRAVENOUS | Status: AC
  Administered 2017-03-29: 15:00:00 via INTRAVENOUS
  Filled 2017-03-29: qty 4

## 2017-03-29 MED ORDER — SODIUM CHLORIDE 0.9 % IV SOLN
Freq: Once | INTRAVENOUS | Status: AC
  Administered 2017-03-29: 15:00:00 via INTRAVENOUS
  Filled 2017-03-29: qty 1000

## 2017-03-29 MED ORDER — SODIUM CHLORIDE 0.9 % IJ SOLN
10.0000 mL | INTRAMUSCULAR | Status: DC | PRN
  Administered 2017-03-29: 10 mL
  Filled 2017-03-29: qty 10

## 2017-03-29 MED ORDER — SODIUM CHLORIDE 0.9 % IV SOLN
500.0000 mg/m2 | Freq: Once | INTRAVENOUS | Status: AC
  Administered 2017-03-29: 1050 mg via INTRAVENOUS
  Filled 2017-03-29: qty 42

## 2017-03-29 MED ORDER — HEPARIN SOD (PORK) LOCK FLUSH 100 UNIT/ML IV SOLN
500.0000 [IU] | Freq: Once | INTRAVENOUS | Status: AC | PRN
  Administered 2017-03-29: 500 [IU]
  Filled 2017-03-29: qty 5

## 2017-03-29 NOTE — Assessment & Plan Note (Addendum)
Stage IV NED. Patient currently on Alimta maintenance since 2012. April 25th 2018-- CT chest abdomen pelvis NED; except left lower lobe groundglass opacities [C discussion below].  No clinical evidence of progression at this time;   #  Proceed with chemo/ alimta today;  Labs today reviewed;  acceptable for treatment today.   # Groundglass opacities waxing and waning- previous CT scan right-sided groundglass opacities improved. However in April 25 CT scan -Groundglass opacities left lower lobe- new; unclear etiology. Clinically not symptomatic. Do not recommend holding treatment as this is clinically asymptomatic.  # Hyponatremia- sodium 129. improved. Monitor for now.   # Bil LE edema Grade 1; continue Lasix/aldactone daily.   # fatty liver- incidental on CT scan.   # Follow-up with me in 3 weeks' labs chemotherapy. Discussed with clinical trials RN.   # I reviewed the blood work- with the patient in detail; also reviewed the imaging independently [as summarized above]; and with the patient in detail.

## 2017-03-29 NOTE — Progress Notes (Signed)
Lower extremity edema stable-unchanged since last visit.

## 2017-03-29 NOTE — Progress Notes (Signed)
Mitchell Mosley returns to clinic today for consideration of cycle #602, Alimta infusion on the [employer] research study. VS are stable and patient remains afebrile. His weight is stable at 92.5kg (204 lbs). Patient reports his adverse events have not really changed since last visit.  He continues to report some wrist pain related to tendonitis, which he says is getting better. He also continues to have trace edema in his BLE and takes Lasix and Spironolactone daily for the edema. Serum sodium level has decreased to 129 mmol/L, Grade 1. Platelet count WNL today  and patient's glucose is essentially normal. CBC and other chemistries are within acceptable parameters and Dr. Rogue Bussing reviewed patient's CT results with him, and although the previous small area of infiltrate has resolved, the scan shows "new mild patchy left lower lobe ground-glass opacities" which Dr. Rogue Bussing does not feel is significant at this time. He plans to go ahead and administer Alimta infusion today as scheduled.  Patient denies any new adverse events since last visit.  Adverse events with grade and attribution as follows:    Adverse Event Log Study/Protocol: [facility] S130 Cycle: 601 Event Grade Onset Date Resolved Date Drug Name Pemetrexed Attribution Treatment Comments  Edema (mild) Grade 1 05/30/12 ongoing  Possible  Trace edema in ble up to calf  Fatigue Grade1 02/03/16 ongoing  Possible    Hyponatremia Grade 3 03/29/17   Possible  Sodium 129  Tendonitis Grade 1 09/21/16 ongoing  Unrelated None   Hyponatremia Grade 1 03/08/17 03/29/17  Possible  Sodium [address], BSN, [place], OCN 03/29/2017 3:08 PM

## 2017-03-29 NOTE — Progress Notes (Signed)
[facility] OFFICE PROGRESS NOTE  Patient Care Team: Mitchell Mosley., [name], stage 4 [facility])   Staging form: Lung, AJCC 7th Edition     Clinical stage from 05/24/2015: Stage IV ([place], NX, M1b) - Signed by Mitchell Alf, MD on 05/24/2015    Oncology History   # 2012- METASTATIC ADENO CA of LEFT LUNG [acinar pattern] s/p MED LN Bx [NEG- EGFR/K-ras/ALK mutation];[name]; [name]; CT JULY 2016- NED; JAN 2018- NED  # Thoracic Aneurysm [4cm- stable]     Cancer of hilus of left lung ([place])   07/20/2016 Initial Diagnosis    Cancer of hilus of left lung ([place])       INTERVAL HISTORY:  [name] with above history of metastatic adenocarcinoma the lung currently on maintenance Alimta since June 2012 is here for follow-up prior to his chemotherapy;And also review the results of the CT scan.  Patient continues to have the wrist pain. Continues to be meloxicam as needed. Patient denies any significant cough. Denies any significant shortness of breath.Appetite is good. No weight loss.Denies any headaches. Denies any vision changes. He is very active.  Mild intermittent swelling in the legs for which he takes Lasix and spironolactone.    REVIEW OF SYSTEMS:  A complete 10 point review of system is done which is negative except mentioned above/history of present illness.   PAST MEDICAL HISTORY :  Past Medical History:  Diagnosis Date  . Allergy   . Lung cancer ([place])   . Mild valvular heart disease Nov. 2014   per 2 D Echocardiogram done for persistent leg edema, monitored by cardiology  . Peripheral edema     PAST SURGICAL HISTORY :   Past Surgical History:  Procedure Laterality Date  . ELBOW SURGERY    . Left anterior thoracotomy with biopsy  March 2012  . PORTACATH  PLACEMENT  March 2012  . TONSILLECTOMY      FAMILY HISTORY :   Family History  Problem Relation Age of Onset  . Leukemia Father   . Diabetes Mother     SOCIAL HISTORY:   Social History  Substance Use Topics  . Smoking status: Never Smoker  . Smokeless tobacco: Never Used  . Alcohol use 0.0 oz/week     Comment: "social drinker"     ALLERGIES:  is allergic to penicillins.  MEDICATIONS:  Current Outpatient Prescriptions  Medication Sig Dispense Refill  . folic acid (FOLVITE) 1 MG tablet TAKE 1 TABLET (1 MG TOTAL) BY MOUTH DAILY. 30 tablet 2  . furosemide (LASIX) 20 MG tablet TAKE 1 TABLET (20 MG TOTAL) BY MOUTH DAILY. ONE TABLET DAILY AS NEEDED FOR SWELLING 90 tablet 1  . meloxicam (MOBIC) 7.5 MG tablet Take 7.5 mg by mouth daily.     . Multiple Vitamin (MULTIVITAMIN) capsule Take 1 capsule by mouth daily. Reported on 02/24/2016    . Omega-3 Fatty Acids (FISH OIL) 1000 MG CAPS Take 1 capsule by mouth daily. Reported on 02/24/2016    . senna (SENOKOT) 8.6 MG tablet Take 1 tablet by mouth daily. Take as needed around time of chemotherapy    . spironolactone (ALDACTONE) 25 MG tablet Take 1 tablet (25 mg total) by mouth daily. 90 tablet 3  . vitamin B-12 (CYANOCOBALAMIN) 1000 MCG tablet Take 1,000 mcg by mouth daily.    Marland Kitchen  CIALIS 20 MG tablet TAKE 1/2 TO 1 TABLET BY MOUTH EVERY 2 DAYS AS NEEDED (Patient not taking: Reported on 01/25/2017) 10 tablet 12  . ondansetron (ZOFRAN-ODT) 4 MG disintegrating tablet Take 1 tablet (4 mg total) by mouth every 4 (four) hours as needed for nausea or vomiting. (Patient not taking: Reported on 03/29/2017) 20 tablet 2   No current facility-administered medications for this visit.    Facility-Administered Medications Ordered in Other Visits  Medication Dose Route Frequency Provider Last Rate Last Dose  . sodium chloride 0.9 % injection 10 mL  10 mL Intracatheter PRN Mitchell Alf, MD   10 mL at 05/27/15 1543  . sodium chloride 0.9 % injection 10 mL  10 mL  Intracatheter PRN Mitchell Alf, MD   10 mL at 06/17/15 1510  . sodium chloride 0.9 % injection 10 mL  10 mL Intravenous PRN Mitchell Gleason, MD   10 mL at 07/08/15 1340    PHYSICAL EXAMINATION: ECOG PERFORMANCE STATUS: 0 - Asymptomatic  BP 117/72   Pulse 75   Temp 97.8 F (36.6 C) (Tympanic)   Resp 18   There were no vitals filed for this visit.   GENERAL: Well-nourished well-developed; Alert, [name]; good dentition  NECK: supple, [name], [name]; 1+ bilateral lower extremity swelling. ABDOMEN:abdomen soft, [name];  LABORATORY DATA:  I have reviewed the data as listed    Component Value Date/Time   NA 129 (L) 03/29/2017 1124   NA 138 12/10/2014 1341   K 3.8 03/29/2017 1124   K 3.6 12/10/2014 1341   CL 96 (L) 03/29/2017 1124   CL 102 12/10/2014 1341   CO2 27 03/29/2017 1124   CO2 30 12/10/2014 1341   GLUCOSE 101 (H) 03/29/2017 1124   GLUCOSE 160 (H) 12/10/2014 1341   BUN 14 03/29/2017 1124   BUN 21 (H) 12/10/2014 1341   CREATININE 0.88 03/29/2017 1124   CREATININE 1.10 03/25/2015 1453   CALCIUM 9.1 03/29/2017 1124   CALCIUM 8.4 (L) 12/10/2014 1341   PROT 7.2 03/29/2017 1124   PROT 6.6 12/10/2014 1341   ALBUMIN 4.2 03/29/2017 1124   ALBUMIN 3.4 12/10/2014 1341   AST 23 03/29/2017 1124   AST 17 12/10/2014 1341   ALT 17 03/29/2017 1124   ALT 23 12/10/2014 1341   ALKPHOS 43 03/29/2017 1124   ALKPHOS 50 12/10/2014 1341   BILITOT 0.8 03/29/2017 1124   BILITOT 0.5 12/10/2014 1341    GFRNONAA >60 03/29/2017 1124   GFRNONAA >60 03/25/2015 1453   GFRAA >60 03/29/2017 1124   GFRAA >60 03/25/2015 1453    No results found for: SPEP, UPEP  Lab Results  Component Value Date   WBC 4.5 03/29/2017   NEUTROABS 2.4 03/29/2017   HGB 13.8 03/29/2017   HCT 39.8 (L) 03/29/2017   MCV 91.6 03/29/2017   PLT 160 03/29/2017      Chemistry      Component Value Date/Time   NA 129 (L) 03/29/2017 1124   NA 138 12/10/2014 1341   K 3.8 03/29/2017 1124  K 3.6 12/10/2014 1341   CL 96 (L) 03/29/2017 1124   CL 102 12/10/2014 1341   CO2 27 03/29/2017 1124   CO2 30 12/10/2014 1341   BUN 14 03/29/2017 1124   BUN 21 (H) 12/10/2014 1341   CREATININE 0.88 03/29/2017 1124   CREATININE 1.10 03/25/2015 1453      Component Value Date/Time   CALCIUM 9.1 03/29/2017 1124   CALCIUM 8.4 (L) 12/10/2014 1341   ALKPHOS 43 03/29/2017 1124   ALKPHOS 50 12/10/2014 1341   AST 23 03/29/2017 1124   AST 17 12/10/2014 1341   ALT 17 03/29/2017 1124   ALT 23 12/10/2014 1341   BILITOT 0.8 03/29/2017 1124   BILITOT 0.5 12/10/2014 1341       RADIOGRAPHIC STUDIES: I have personally reviewed the radiological images as listed and agreed with the findings in the report. No results found.   ASSESSMENT & PLAN:  Cancer of hilus of left lung ([place]) Stage IV NED. Patient currently on Alimta maintenance since 2012. April 25th 2018-- CT chest abdomen pelvis NED; except left lower lobe groundglass opacities [C discussion below].  No clinical evidence of progression at this time;   #  Proceed with chemo/ alimta today;  Labs today reviewed;  acceptable for treatment today.   # Groundglass opacities waxing and waning- previous CT scan right-sided groundglass opacities improved. However in April 25 CT scan -Groundglass opacities left lower lobe- new; unclear etiology. Clinically not symptomatic. Do not recommend holding treatment as this is clinically asymptomatic.  # Hyponatremia- sodium 129. improved. Monitor  for now.   # Bil LE edema Grade 1; continue Lasix/aldactone daily.   # fatty liver- incidental on CT scan.   # Follow-up with me in 3 weeks' labs chemotherapy. Discussed with clinical trials RN.   # I reviewed the blood work- with the patient in detail; also reviewed the imaging independently [as summarized above]; and with the patient in detail.   No orders of the defined types were placed in this encounter.     [name], MD 03/30/2017 10:26 AM

## 2017-04-05 ENCOUNTER — Telehealth: Payer: Self-pay | Admitting: *Deleted

## 2017-04-05 ENCOUNTER — Ambulatory Visit
Admission: RE | Admit: 2017-04-05 | Discharge: 2017-04-05 | Disposition: A | Discharge status: Home / Self Care | Payer: BLUE CROSS/BLUE SHIELD | Source: Ambulatory Visit | Attending: Internal Medicine | Admitting: Internal Medicine

## 2017-04-05 ENCOUNTER — Other Ambulatory Visit: Payer: Self-pay | Admitting: *Deleted

## 2017-04-05 DIAGNOSIS — R0989 Other specified symptoms and signs involving the circulatory and respiratory systems: Secondary | ICD-10-CM | POA: Diagnosis not present

## 2017-04-05 DIAGNOSIS — C3402 Malignant neoplasm of left main bronchus: Secondary | ICD-10-CM | POA: Diagnosis not present

## 2017-04-05 DIAGNOSIS — J00 Acute nasopharyngitis [common cold]: Secondary | ICD-10-CM | POA: Diagnosis not present

## 2017-04-05 MED ORDER — LORATADINE 10 MG PO TABS: 10.0000 mg | ORAL_TABLET | Freq: Every day | ORAL | Status: DC

## 2017-04-05 MED ORDER — AZITHROMYCIN 250 MG PO TABS: ORAL_TABLET | ORAL | 0 refills | Status: DC

## 2017-04-05 NOTE — Telephone Encounter (Signed)
Received t/c from [facility] reporting worsening symptoms of cough, chest congestion, heaviness in eyes, runny nose and sneezing. States he has been taking Mucinex for the past 3 days with no improvement of symptoms. Reported these symptoms to Dr. Rogue Bussing, who states Mr. Coe needs to go ahead and get a chest x-ray today and begin taking Claritin daily for the allergy symptoms. States he may also prescribe a z-pak depending on the results of the chest x-ray. T/C made back to patient and instructed as above. Patient states he will come to [facility] imaging center as soon as possible and get the chest x-ray. Also states he will start taking Claritin as recommended. Yolande Jolly, BSN, MHA, OCN 04/05/2017 12:04 PM

## 2017-04-05 NOTE — Telephone Encounter (Signed)
T/C made to [facility] per Dr. Rogue Bussing to report negative chest x-ray results. Instructed patient that Dr. Rogue Bussing is calling in a Z-pak for him to start taking as instructed today along with the Claritin. Informed that if he has not improved by Monday, Dr. Rogue Bussing will start him on some prednisone. Patient voiced understanding of instructions and states he will call me on Monday with an update. Yolande Jolly, BSN, MHA, OCN 04/05/2017 3:14 PM

## 2017-04-05 NOTE — Progress Notes (Signed)
Mitchell Mosley- inform pt- that CXR is normal; recommend claritin; also start z-pak '250mg'$  ['500mg'$  - day1; and then once a dayx 4 days]; if not improved tell him we can call him prednisone. Thx

## 2017-04-08 ENCOUNTER — Telehealth: Payer: Self-pay | Admitting: *Deleted

## 2017-04-08 NOTE — Telephone Encounter (Signed)
Received phone call from [facility], who reports he is feeling much better today than he did on Friday. States he still has a little sinus drainage today, but not nearly as bad today and he is no longer coughing. Instructed patient to call if symptoms do not continue to improve and he agrees. Yolande Jolly, BSN, MHA, OCN 04/08/2017 12:01 PM

## 2017-04-11 ENCOUNTER — Encounter: Payer: Self-pay | Admitting: Family Medicine

## 2017-04-11 ENCOUNTER — Ambulatory Visit (INDEPENDENT_AMBULATORY_CARE_PROVIDER_SITE_OTHER): Payer: BLUE CROSS/BLUE SHIELD | Admitting: Family Medicine

## 2017-04-11 VITALS — BP 102/70 | HR 78 | Temp 97.9°F | Resp 16 | Ht 70.0 in | Wt 200.0 lb

## 2017-04-11 DIAGNOSIS — Z0000 Encounter for general adult medical examination without abnormal findings: Secondary | ICD-10-CM

## 2017-04-11 DIAGNOSIS — Z1211 Encounter for screening for malignant neoplasm of colon: Secondary | ICD-10-CM

## 2017-04-11 DIAGNOSIS — Z125 Encounter for screening for malignant neoplasm of prostate: Secondary | ICD-10-CM

## 2017-04-11 LAB — POCT URINALYSIS DIPSTICK
BILIRUBIN UA: NEGATIVE
GLUCOSE UA: NEGATIVE
KETONES UA: NEGATIVE
Leukocytes, UA: NEGATIVE
Nitrite, UA: NEGATIVE
Protein, UA: NEGATIVE
RBC UA: NEGATIVE
SPEC GRAV UA: 1.015 (ref 1.010–1.025)
Urobilinogen, UA: 0.2 E.U./dL
pH, UA: 6 (ref 5.0–8.0)

## 2017-04-11 NOTE — Progress Notes (Signed)
Patient: Mitchell Mosley, Male    DOB: 01/12/1961, 56 y.o.   MRN: [id number] Visit Date: 04/11/2017  Today's Provider: Wilhemena Durie, MD   Chief Complaint  Patient presents with  . Annual Exam   Subjective:    Annual physical exam Mitchell Mosley is a 56 y.o. male who presents today for health maintenance and complete physical. He feels well. 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 He reports he is sleeping well.  -----------------------------------------------------------------  Colonoscopy- never  Immunization History  Administered Date(s) Administered  . Influenza,inj,Quad PF,36+ Mos 08/19/2015, 09/21/2016  . Tdap 03/16/2015    Review of Systems  Constitutional: Negative.   HENT: Positive for sinus pressure and voice change.   Eyes: Positive for redness and itching.  Respiratory: Negative.   Cardiovascular: Positive for leg swelling.  Gastrointestinal: Negative.   Endocrine: Negative.   Genitourinary: Negative.   Musculoskeletal: Positive for neck stiffness.  Skin: Negative.   Allergic/Immunologic: Positive for environmental allergies.  Neurological: Negative.   Hematological: Negative.   Psychiatric/Behavioral: Negative.     Social History      He  reports that he has never smoked. He has never used smokeless tobacco. He reports that he drinks alcohol. He reports that he does not use drugs.       Social History   Social History  . Marital status: Married    Spouse name: N/A  . Number of children: N/A  . Years of education: N/A   Social History Main Topics  . Smoking status: Never Smoker  . Smokeless tobacco: Never Used  . Alcohol use 0.0 oz/week     Comment: "social drinker"   . Drug use: No  . Sexual activity: Yes   Other Topics Concern  . None   Social History Narrative  . None    Past Medical History:  Diagnosis Date  . Allergy   . Lung cancer ([place])   . Mild valvular heart disease Nov. 2014   per 2 D Echocardiogram done  for persistent leg edema, monitored by cardiology  . Peripheral edema      Patient Active Problem List   Diagnosis Date Noted  . Chest congestion 04/05/2017  . Cancer of hilus of left lung ([place]) 07/20/2016  . Hyponatremia 05/18/2016  . Thrombocytopenia ([place]) 05/18/2016  . Encounter for antineoplastic chemotherapy 05/18/2016  . ED (erectile dysfunction) of organic origin 03/19/2016  . Edema, peripheral 04/09/2014    Past Surgical History:  Procedure Laterality Date  . ELBOW SURGERY    . Left anterior thoracotomy with biopsy  March 2012  . PORTACATH PLACEMENT  March 2012  . TONSILLECTOMY      Family History        Family Status  Relation Status  . Father Deceased at age 45  . Mother Alive  . Sister Alive  . Brother Alive  . Brother Alive  . Brother Alive        His family history includes Diabetes in his mother; Leukemia in his father.     Allergies  Allergen Reactions  . Penicillins     Reaction and severity unknown     Current Outpatient Prescriptions:  .  azithromycin (ZITHROMAX Z-PAK) 250 MG tablet, As directed, Disp: 6 each, Rfl: 0 .  folic acid (FOLVITE) 1 MG tablet, TAKE 1 TABLET (1 MG TOTAL) BY MOUTH DAILY., Disp: 30 tablet, Rfl: 2 .  furosemide (LASIX) 20 MG tablet, TAKE 1 TABLET (20 MG TOTAL) BY MOUTH  DAILY. ONE TABLET DAILY AS NEEDED FOR SWELLING, Disp: 90 tablet, Rfl: 1 .  loratadine (CLARITIN) 10 MG tablet, Take 1 tablet (10 mg total) by mouth daily., Disp: , Rfl:  .  meloxicam (MOBIC) 7.5 MG tablet, Take 7.5 mg by mouth daily. , Disp: , Rfl:  .  Multiple Vitamin (MULTIVITAMIN) capsule, Take 1 capsule by mouth daily. Reported on 02/24/2016, Disp: , Rfl:  .  Omega-3 Fatty Acids (FISH OIL) 1000 MG CAPS, Take 1 capsule by mouth daily. Reported on 02/24/2016, Disp: , Rfl:  .  senna (SENOKOT) 8.6 MG tablet, Take 1 tablet by mouth daily. Take as needed around time of chemotherapy, Disp: , Rfl:  .  spironolactone (ALDACTONE) 25 MG tablet, Take 1 tablet (25 mg  total) by mouth daily., Disp: 90 tablet, Rfl: 3 .  vitamin B-12 (CYANOCOBALAMIN) 1000 MCG tablet, Take 1,000 mcg by mouth daily., Disp: , Rfl:  .  CIALIS 20 MG tablet, TAKE 1/2 TO 1 TABLET BY MOUTH EVERY 2 DAYS AS NEEDED (Patient not taking: Reported on 01/25/2017), Disp: 10 tablet, Rfl: 12 .  ondansetron (ZOFRAN-ODT) 4 MG disintegrating tablet, Take 1 tablet (4 mg total) by mouth every 4 (four) hours as needed for nausea or vomiting. (Patient not taking: Reported on 03/29/2017), Disp: 20 tablet, Rfl: 2 No current facility-administered medications for this visit.   Facility-Administered Medications Ordered in Other Visits:  .  sodium chloride 0.9 % injection 10 mL, 10 mL, Intracatheter, PRN, [name], MD, 10 mL at 05/27/15 1543 .  sodium chloride 0.9 % injection 10 mL, 10 mL, Intracatheter, PRN, [name], MD, 10 mL at 06/17/15 1510 .  sodium chloride 0.9 % injection 10 mL, 10 mL, Intravenous, PRN, [name], MD, 10 mL at 07/08/15 1340   Patient Care Team: [name], MD as PCP - General (Family Medicine)      Objective:   Vitals: BP 102/70 (BP Location: Left Arm, Patient Position: Sitting, Cuff Size: Normal)   Pulse 78   Temp 97.9 F (36.6 C) (Oral)   Resp 16   Ht '5\' 10"'$  (1.778 m)   Wt 200 lb (90.7 kg)   SpO2 98%   BMI 28.70 kg/m    Vitals:   04/11/17 1413  BP: 102/70  Pulse: 78  Resp: 16  Temp: 97.9 F (36.6 C)  TempSrc: Oral  SpO2: 98%  Weight: 200 lb (90.7 kg)  Height: '5\' 10"'$  (1.778 m)     Physical Exam  Constitutional: He is oriented to person, place, and time. He appears well-developed and well-nourished.  HENT:  Head: Normocephalic and atraumatic.  Right Ear: External ear normal.  Left Ear: External ear normal.  Nose: Nose normal.  Mouth/Throat: Oropharynx is clear and moist.  Eyes: Conjunctivae and EOM are normal. Pupils are equal, round, and reactive to light.  Neck: Normal range of motion. Neck supple.  Cardiovascular: Normal  rate, regular rhythm, normal heart sounds and intact distal pulses.   Pulmonary/Chest: Effort normal and breath sounds normal.  Abdominal: Soft. Bowel sounds are normal.  Genitourinary: Rectum normal, prostate normal and penis normal.  Musculoskeletal: Normal range of motion.  Neurological: He is alert and oriented to person, place, and time. He has normal reflexes.  Skin: Skin is warm and dry.  Psychiatric: He has a normal mood and affect. His behavior is normal. Judgment and thought content normal.     Depression Screen PHQ 2/9 Scores 04/11/2017  PHQ - 2 Score 0  PHQ- 9 Score 0  Assessment & Plan:     Routine Health Maintenance and Physical Exam  Exercise Activities and Dietary recommendations Goals    None      Immunization History  Administered Date(s) Administered  . Influenza,inj,Quad PF,36+ Mos 08/19/2015, 09/21/2016  . Tdap 03/16/2015    Health Maintenance  Topic Date Due  . Hepatitis C Screening  03-10-1961  . HIV Screening  06/10/1977  . COLONOSCOPY  06/10/2012  . INFLUENZA VACCINE  07/03/2017  . TETANUS/TDAP  03/15/2025    Refer for screening colonoscopy. Discussed health benefits of physical activity, and encouraged him to engage in regular exercise appropriate for his age and condition.  Metastatic Adenocarcinoma of Lung    --------------------------------------------------------------------   I have done the exam and reviewed the above chart and it is accurate to the best of my knowledge. Development worker, community has been used in this note in any air is in the dictation or transcription are unintentional.  Wilhemena Durie, MD  [place]

## 2017-04-19 ENCOUNTER — Inpatient Hospital Stay: Payer: BLUE CROSS/BLUE SHIELD

## 2017-04-19 ENCOUNTER — Encounter: Payer: Self-pay | Admitting: *Deleted

## 2017-04-19 ENCOUNTER — Inpatient Hospital Stay: Payer: BLUE CROSS/BLUE SHIELD | Attending: Internal Medicine | Admitting: Internal Medicine

## 2017-04-19 VITALS — BP 119/79 | HR 89 | Temp 97.9°F | Resp 18 | Wt 202.4 lb

## 2017-04-19 DIAGNOSIS — Z5111 Encounter for antineoplastic chemotherapy: Secondary | ICD-10-CM | POA: Insufficient documentation

## 2017-04-19 DIAGNOSIS — C3480 Malignant neoplasm of overlapping sites of unspecified bronchus and lung: Principal | ICD-10-CM

## 2017-04-19 DIAGNOSIS — R21 Rash and other nonspecific skin eruption: Secondary | ICD-10-CM

## 2017-04-19 DIAGNOSIS — Z006 Encounter for examination for normal comparison and control in clinical research program: Secondary | ICD-10-CM

## 2017-04-19 DIAGNOSIS — C3402 Malignant neoplasm of left main bronchus: Secondary | ICD-10-CM

## 2017-04-19 DIAGNOSIS — C3491 Malignant neoplasm of unspecified part of right bronchus or lung: Secondary | ICD-10-CM

## 2017-04-19 DIAGNOSIS — E871 Hypo-osmolality and hyponatremia: Secondary | ICD-10-CM

## 2017-04-19 DIAGNOSIS — Z79899 Other long term (current) drug therapy: Secondary | ICD-10-CM

## 2017-04-19 DIAGNOSIS — R600 Localized edema: Secondary | ICD-10-CM

## 2017-04-19 DIAGNOSIS — C7802 Secondary malignant neoplasm of left lung: Secondary | ICD-10-CM

## 2017-04-19 DIAGNOSIS — I712 Thoracic aortic aneurysm, without rupture: Secondary | ICD-10-CM

## 2017-04-19 DIAGNOSIS — D696 Thrombocytopenia, unspecified: Secondary | ICD-10-CM

## 2017-04-19 DIAGNOSIS — C33 Malignant neoplasm of trachea: Secondary | ICD-10-CM

## 2017-04-19 LAB — COMPREHENSIVE METABOLIC PANEL
ALT: 19 U/L (ref 17–63)
ANION GAP: 5 (ref 5–15)
AST: 25 U/L (ref 15–41)
Albumin: 4 g/dL (ref 3.5–5.0)
Alkaline Phosphatase: 36 U/L — ABNORMAL LOW (ref 38–126)
BUN: 21 mg/dL — ABNORMAL HIGH (ref 6–20)
CALCIUM: 9.1 mg/dL (ref 8.9–10.3)
CHLORIDE: 97 mmol/L — AB (ref 101–111)
CO2: 29 mmol/L (ref 22–32)
CREATININE: 1.04 mg/dL (ref 0.61–1.24)
Glucose, Bld: 102 mg/dL — ABNORMAL HIGH (ref 65–99)
Potassium: 3.5 mmol/L (ref 3.5–5.1)
Sodium: 131 mmol/L — ABNORMAL LOW (ref 135–145)
Total Bilirubin: 0.8 mg/dL (ref 0.3–1.2)
Total Protein: 6.9 g/dL (ref 6.5–8.1)

## 2017-04-19 LAB — CBC WITH DIFFERENTIAL/PLATELET
Basophils Absolute: 0 10*3/uL (ref 0–0.1)
Basophils Relative: 1 %
EOS ABS: 0.1 10*3/uL (ref 0–0.7)
EOS PCT: 2 %
HCT: 37.2 % — ABNORMAL LOW (ref 40.0–52.0)
Hemoglobin: 13 g/dL (ref 13.0–18.0)
LYMPHS ABS: 1.4 10*3/uL (ref 1.0–3.6)
LYMPHS PCT: 28 %
MCH: 32.1 pg (ref 26.0–34.0)
MCHC: 34.8 g/dL (ref 32.0–36.0)
MCV: 92.1 fL (ref 80.0–100.0)
MONO ABS: 0.6 10*3/uL (ref 0.2–1.0)
MONOS PCT: 11 %
Neutro Abs: 2.9 10*3/uL (ref 1.4–6.5)
Neutrophils Relative %: 58 %
PLATELETS: 153 10*3/uL (ref 150–440)
RBC: 4.04 MIL/uL — ABNORMAL LOW (ref 4.40–5.90)
RDW: 13.7 % (ref 11.5–14.5)
WBC: 5 10*3/uL (ref 3.8–10.6)

## 2017-04-19 MED ORDER — SODIUM CHLORIDE 0.9 % IV SOLN
Freq: Once | INTRAVENOUS | Status: AC
  Administered 2017-04-19: 15:00:00 via INTRAVENOUS
  Filled 2017-04-19: qty 4

## 2017-04-19 MED ORDER — CYANOCOBALAMIN 1000 MCG/ML IJ SOLN
1000.0000 ug | Freq: Once | INTRAMUSCULAR | Status: AC
  Administered 2017-04-19: 1000 ug via INTRAMUSCULAR
  Filled 2017-04-19: qty 1

## 2017-04-19 MED ORDER — HEPARIN SOD (PORK) LOCK FLUSH 100 UNIT/ML IV SOLN
500.0000 [IU] | Freq: Once | INTRAVENOUS | Status: AC
  Administered 2017-04-19: 500 [IU] via INTRAVENOUS
  Filled 2017-04-19: qty 5

## 2017-04-19 MED ORDER — SODIUM CHLORIDE 0.9% FLUSH
10.0000 mL | Freq: Once | INTRAVENOUS | Status: AC
  Administered 2017-04-19: 10 mL via INTRAVENOUS
  Filled 2017-04-19: qty 10

## 2017-04-19 MED ORDER — SODIUM CHLORIDE 0.9 % IV SOLN
Freq: Once | INTRAVENOUS | Status: AC
  Administered 2017-04-19: 15:00:00 via INTRAVENOUS
  Filled 2017-04-19: qty 1000

## 2017-04-19 MED ORDER — SODIUM CHLORIDE 0.9 % IV SOLN
500.0000 mg/m2 | Freq: Once | INTRAVENOUS | Status: AC
  Administered 2017-04-19: 1050 mg via INTRAVENOUS
  Filled 2017-04-19: qty 42

## 2017-04-19 NOTE — Progress Notes (Signed)
[name] returns to clinic today for consideration of cycle #603, Alimta infusion on the [employer] research study. VS are stable. His weight is stable at 91.8kg (202 lbs). Patient reports his symptoms of cough and congestion have resolved, but he has developed an area of itching on upper right arm, right palm, and bottom of left foot.  Dr.Brahamanday has recommended OTC hydrocortisone cream for this.  The patient also continues to have trace edema in his BLE and takes Lasix and Spironolactone daily for the edema.  Patient does not report any change in his fatigue or tendonitis, but continues to take medication for the tendonitis. Serum sodium level has increased to 131 mmol/L, Grade 1. Platelet count WNL today and patient's glucose is essentially normal. CBC and other chemistries are within acceptable parameters  Dr. Tish Men plans to go ahead and administer Alimta infusion today as scheduled.  Patient will also receive his Vitamin B-12 injection today. Adverse events with grade and attribution as follows:    Adverse Event Log Study/Protocol: [name] S130 Cycle: 602 Event Grade Onset Date Resolved Date Drug Name Pemetrexed Attribution Treatment Comments  Edema (mild) Grade 1 05/30/12 ongoing  Possible  Trace edema in ble up to calf  Fatigue Grade1 02/03/16 ongoing  Possible    Tendonitis Grade 1 09/21/16 ongoing  Unrelated None   Hyponatremia Grade 1 04/19/17   Possible  Sodium 131  Pruritis Grade 1 04/19/17   Possible    Cough/Congestion Grade 2 04/05/17 04/19/17  Unrelated Zpack, Claritin, chest xray   Raynelle Dick, RN BSN "04/19/2017 2:56 PM"

## 2017-04-19 NOTE — Assessment & Plan Note (Addendum)
Stage IV NED. Patient currently on Alimta maintenance since 2012. April 25th 2018-- CT chest abdomen pelvis NED; except left lower lobe groundglass opacities [C discussion below].  No clinical evidence of progression at this time;   #  Proceed with chemo/ alimta today;  Labs today reviewed;  acceptable for treatment today. Due for b12 injection today.   # skin rash- right arm/ left sole- ? Etiology- recommend hydrocortisone topical as needed.   # Hyponatremia- sodium 131. improved. Monitor for now.   # Bil LE edema Grade 1; continue Lasix/aldactone daily.   # Follow-up with me in 3 weeks labs chemotherapy. Discussed with clinical trials RN.

## 2017-04-19 NOTE — Progress Notes (Signed)
Patient does not offer any problems today.  

## 2017-04-19 NOTE — Progress Notes (Signed)
[facility] OFFICE PROGRESS NOTE  Patient Care Team: [name], [name], stage 4 [facility])   Staging form: Lung, AJCC 7th Edition     Clinical stage from 05/24/2015: Stage IV ([place], NX, [name], MD on 05/24/2015    Oncology History   # 2012- METASTATIC [place]ENO CA of LEFT LUNG [acinar pattern] s/p MED LN Bx [NEG- EGFR/K-ras/ALK mutation];[name]; [name]; CT JULY 2016- NED; JAN 2018- NED  # Thoracic Aneurysm [4cm- stable]     Cancer of hilus of left lung ([place])   07/20/2016 Initial Diagnosis    Cancer of hilus of left lung ([place])       INTERVAL HISTORY:  [name] with above history of metastatic adenocarcinoma the lung currently on maintenance Alimta since June 2012 is here for follow-up prior to his chemotherapy.  In the interim patient had episode of worsening cough and shortness of breath. Patient had subjective fevers- which was treated with Z-Pak. Chest x-ray did not show any acute process. Currently resolved.  He currently feels well. Patient denies any significant cough. Denies any significant shortness of breath. Appetite is good. No weight loss.Denies any headaches. Denies any vision changes. He is very active.  Mild intermittent swelling in the legs for which he takes Lasix and spironolactone. Intermittent itchy skin rash on his palms and soles. Currently resolved.  REVIEW OF SYSTEMS:  A complete 10 point review of system is done which is negative except mentioned above/history of present illness.   PAST MEDICAL HISTORY :  Past Medical History:  Diagnosis Date  . Allergy   . Lung cancer ([place])   . Mild valvular heart disease Nov. 2014   per 2 D Echocardiogram done for persistent leg edema, monitored by cardiology  . Peripheral  edema     PAST SURGICAL HISTORY :   Past Surgical History:  Procedure Laterality Date  . ELBOW SURGERY    . Left anterior thoracotomy with biopsy  March 2012  . PORTACATH PLACEMENT  March 2012  . TONSILLECTOMY      FAMILY HISTORY :   Family History  Problem Relation Age of Onset  . Leukemia Father   . Diabetes Mother     SOCIAL HISTORY:   Social History  Substance Use Topics  . Smoking status: Never Smoker  . Smokeless tobacco: Never Used  . Alcohol use 0.0 oz/week     Comment: "social drinker"     ALLERGIES:  is allergic to penicillins.  MEDICATIONS:  Current Outpatient Prescriptions  Medication Sig Dispense Refill  . CIALIS 20 MG tablet TAKE 1/2 TO 1 TABLET BY MOUTH EVERY 2 DAYS AS NEEDED 10 tablet 12  . folic acid (FOLVITE) 1 MG tablet TAKE 1 TABLET (1 MG TOTAL) BY MOUTH DAILY. 30 tablet 2  . furosemide (LASIX) 20 MG tablet TAKE 1 TABLET (20 MG TOTAL) BY MOUTH DAILY. ONE TABLET DAILY AS NEEDED FOR SWELLING 90 tablet 1  . loratadine (CLARITIN) 10 MG tablet Take 1 tablet (10 mg total) by mouth daily.    . meloxicam (MOBIC) 7.5 MG tablet Take 7.5 mg by mouth daily.     . Multiple Vitamin (MULTIVITAMIN) capsule Take 1 capsule by mouth daily. Reported on 02/24/2016    . Omega-3 Fatty Acids (FISH OIL) 1000 MG CAPS Take 1 capsule  by mouth daily. Reported on 02/24/2016    . ondansetron (ZOFRAN-ODT) 4 MG disintegrating tablet Take 1 tablet (4 mg total) by mouth every 4 (four) hours as needed for nausea or vomiting. 20 tablet 2  . senna (SENOKOT) 8.6 MG tablet Take 1 tablet by mouth daily. Take as needed around time of chemotherapy    . spironolactone (ALDACTONE) 25 MG tablet Take 1 tablet (25 mg total) by mouth daily. 90 tablet 3  . vitamin B-12 (CYANOCOBALAMIN) 1000 MCG tablet Take 1,000 mcg by mouth daily.    Marland Kitchen azithromycin (ZITHROMAX Z-PAK) 250 MG tablet As directed (Patient not taking: Reported on 04/19/2017) 6 each 0   No current facility-administered medications for this  visit.    Facility-Administered Medications Ordered in Other Visits  Medication Dose Route Frequency Provider Last Rate Last Dose  . sodium chloride 0.9 % injection 10 mL  10 mL Intracatheter PRN [name], MD   10 mL at 05/27/15 1543  . sodium chloride 0.9 % injection 10 mL  10 mL Intracatheter PRN [name], MD   10 mL at 06/17/15 1510  . sodium chloride 0.9 % injection 10 mL  10 mL Intravenous PRN [name], MD   10 mL at 07/08/15 1340    PHYSICAL EXAMINATION: ECOG PERFORMANCE STATUS: 0 - Asymptomatic  BP 119/79   Pulse 89   Temp 97.9 F (36.6 C) (Tympanic)   Resp 18   Wt 202 lb 6.4 oz (91.8 kg)   BMI 29.04 kg/m   Filed Weights   04/19/17 1353  Weight: 202 lb 6.4 oz (91.8 kg)     GENERAL: Well-nourished well-developed; Alert, [name]; good dentition  NECK: supple, [name], [name]; 1+ bilateral lower extremity swelling. ABDOMEN:abdomen soft, [name];  LABORATORY DATA:  I have reviewed the data as listed    Component Value Date/Time   NA 131 (L) 04/19/2017 1130   NA 138 12/10/2014 1341   K 3.5 04/19/2017 1130   K 3.6 12/10/2014 1341   CL 97 (L) 04/19/2017 1130   CL 102 12/10/2014 1341   CO2 29 04/19/2017 1130   CO2 30 12/10/2014 1341   GLUCOSE 102 (H) 04/19/2017 1130   GLUCOSE 160 (H) 12/10/2014 1341   BUN 21 (H) 04/19/2017 1130   BUN 21 (H) 12/10/2014 1341   CREATININE 1.04 04/19/2017 1130   CREATININE 1.10 03/25/2015 1453   CALCIUM 9.1 04/19/2017 1130    CALCIUM 8.4 (L) 12/10/2014 1341   PROT 6.9 04/19/2017 1130   PROT 6.6 12/10/2014 1341   ALBUMIN 4.0 04/19/2017 1130   ALBUMIN 3.4 12/10/2014 1341   AST 25 04/19/2017 1130   AST 17 12/10/2014 1341   ALT 19 04/19/2017 1130   ALT 23 12/10/2014 1341   ALKPHOS 36 (L) 04/19/2017 1130   ALKPHOS 50 12/10/2014 1341   BILITOT 0.8 04/19/2017 1130   BILITOT 0.5 12/10/2014 1341   GFRNONAA >60 04/19/2017 1130   GFRNONAA >60 03/25/2015 1453   GFRAA >60 04/19/2017 1130   GFRAA >60 03/25/2015 1453    No  results found for: SPEP, UPEP  Lab Results  Component Value Date   WBC 5.0 04/19/2017   NEUTROABS 2.9 04/19/2017   HGB 13.0 04/19/2017   HCT 37.2 (L) 04/19/2017   MCV 92.1 04/19/2017   PLT 153 04/19/2017      Chemistry      Component Value Date/Time   NA 131 (L) 04/19/2017 1130   NA 138 12/10/2014 1341   K 3.5 04/19/2017 1130   K 3.6 12/10/2014 1341   CL 97 (L) 04/19/2017 1130   CL 102 12/10/2014 1341   CO2 29 04/19/2017 1130   CO2 30 12/10/2014 1341   BUN 21 (H) 04/19/2017 1130   BUN 21 (H) 12/10/2014 1341   CREATININE 1.04 04/19/2017 1130   CREATININE 1.10 03/25/2015 1453      Component Value Date/Time   CALCIUM 9.1 04/19/2017 1130   CALCIUM 8.4 (L) 12/10/2014 1341   ALKPHOS 36 (L) 04/19/2017 1130   ALKPHOS 50 12/10/2014 1341   AST 25 04/19/2017 1130   AST 17 12/10/2014 1341   ALT 19 04/19/2017 1130   ALT 23 12/10/2014 1341   BILITOT 0.8 04/19/2017 1130   BILITOT 0.5 12/10/2014 1341       RADIOGRAPHIC STUDIES: I have personally reviewed the radiological images as listed and agreed with the findings in the report. No results found.   ASSESSMENT & PLAN:  Cancer of hilus of left lung ([place]) Stage IV NED. Patient currently on Alimta maintenance since 2012. April 25th 2018-- CT chest abdomen pelvis NED; except left lower lobe groundglass opacities [C discussion below].  No clinical evidence of progression at this time;   #  Proceed with chemo/ alimta today;  Labs today  reviewed;  acceptable for treatment today. Due for b12 injection today.   # skin rash- right arm/ left sole- ? Etiology- recommend hydrocortisone topical as needed.   # Hyponatremia- sodium 131. improved. Monitor for now.   # Bil LE edema Grade 1; continue Lasix/aldactone daily.   # Follow-up with me in 3 weeks labs chemotherapy. Discussed with clinical trials RN.   No orders of the defined types were placed in this encounter.     [name], MD 04/21/2017 5:48 PM

## 2017-05-10 ENCOUNTER — Inpatient Hospital Stay: Payer: BLUE CROSS/BLUE SHIELD | Attending: Oncology

## 2017-05-10 ENCOUNTER — Encounter: Payer: Self-pay | Admitting: *Deleted

## 2017-05-10 ENCOUNTER — Inpatient Hospital Stay: Payer: BLUE CROSS/BLUE SHIELD

## 2017-05-10 ENCOUNTER — Inpatient Hospital Stay (HOSPITAL_BASED_OUTPATIENT_CLINIC_OR_DEPARTMENT_OTHER): Payer: BLUE CROSS/BLUE SHIELD | Admitting: Oncology

## 2017-05-10 VITALS — BP 116/79 | HR 63 | Temp 97.0°F | Resp 16 | Wt 202.0 lb

## 2017-05-10 DIAGNOSIS — Z006 Encounter for examination for normal comparison and control in clinical research program: Secondary | ICD-10-CM

## 2017-05-10 DIAGNOSIS — Z5111 Encounter for antineoplastic chemotherapy: Secondary | ICD-10-CM | POA: Insufficient documentation

## 2017-05-10 DIAGNOSIS — E871 Hypo-osmolality and hyponatremia: Secondary | ICD-10-CM | POA: Insufficient documentation

## 2017-05-10 DIAGNOSIS — C7802 Secondary malignant neoplasm of left lung: Secondary | ICD-10-CM | POA: Diagnosis not present

## 2017-05-10 DIAGNOSIS — C3402 Malignant neoplasm of left main bronchus: Secondary | ICD-10-CM

## 2017-05-10 DIAGNOSIS — C33 Malignant neoplasm of trachea: Secondary | ICD-10-CM

## 2017-05-10 DIAGNOSIS — I712 Thoracic aortic aneurysm, without rupture: Secondary | ICD-10-CM

## 2017-05-10 DIAGNOSIS — R600 Localized edema: Secondary | ICD-10-CM

## 2017-05-10 DIAGNOSIS — C3480 Malignant neoplasm of overlapping sites of unspecified bronchus and lung: Secondary | ICD-10-CM

## 2017-05-10 DIAGNOSIS — C3490 Malignant neoplasm of unspecified part of unspecified bronchus or lung: Secondary | ICD-10-CM

## 2017-05-10 LAB — CBC WITH DIFFERENTIAL/PLATELET
BASOS ABS: 0 10*3/uL (ref 0–0.1)
BASOS PCT: 1 %
EOS ABS: 0.1 10*3/uL (ref 0–0.7)
Eosinophils Relative: 3 %
HEMATOCRIT: 37.2 % — AB (ref 40.0–52.0)
HEMOGLOBIN: 13.1 g/dL (ref 13.0–18.0)
Lymphocytes Relative: 33 %
Lymphs Abs: 1.4 10*3/uL (ref 1.0–3.6)
MCH: 33 pg (ref 26.0–34.0)
MCHC: 35.3 g/dL (ref 32.0–36.0)
MCV: 93.4 fL (ref 80.0–100.0)
Monocytes Absolute: 0.4 10*3/uL (ref 0.2–1.0)
Monocytes Relative: 10 %
NEUTROS ABS: 2.3 10*3/uL (ref 1.4–6.5)
NEUTROS PCT: 53 %
Platelets: 153 10*3/uL (ref 150–440)
RBC: 3.98 MIL/uL — ABNORMAL LOW (ref 4.40–5.90)
RDW: 14 % (ref 11.5–14.5)
WBC: 4.2 10*3/uL (ref 3.8–10.6)

## 2017-05-10 LAB — URINALYSIS, COMPLETE (UACMP) WITH MICROSCOPIC
BACTERIA UA: NONE SEEN
Bilirubin Urine: NEGATIVE
Glucose, UA: NEGATIVE mg/dL
HGB URINE DIPSTICK: NEGATIVE
Ketones, ur: NEGATIVE mg/dL
Leukocytes, UA: NEGATIVE
Nitrite: NEGATIVE
PROTEIN: NEGATIVE mg/dL
Specific Gravity, Urine: 1.004 — ABNORMAL LOW (ref 1.005–1.030)
Squamous Epithelial / HPF: NONE SEEN
pH: 7 (ref 5.0–8.0)

## 2017-05-10 LAB — COMPREHENSIVE METABOLIC PANEL
ALBUMIN: 3.9 g/dL (ref 3.5–5.0)
ALK PHOS: 39 U/L (ref 38–126)
ALT: 18 U/L (ref 17–63)
AST: 26 U/L (ref 15–41)
Anion gap: 6 (ref 5–15)
BUN: 20 mg/dL (ref 6–20)
CHLORIDE: 98 mmol/L — AB (ref 101–111)
CO2: 28 mmol/L (ref 22–32)
Calcium: 9 mg/dL (ref 8.9–10.3)
Creatinine, Ser: 0.98 mg/dL (ref 0.61–1.24)
GFR calc non Af Amer: >60 mL/min (ref >60)
GLUCOSE: 161 mg/dL — AB (ref 65–99)
Potassium: 3.5 mmol/L (ref 3.5–5.1)
SODIUM: 132 mmol/L — AB (ref 135–145)
Total Bilirubin: 1 mg/dL (ref 0.3–1.2)
Total Protein: 6.8 g/dL (ref 6.5–8.1)

## 2017-05-10 LAB — MAGNESIUM: Magnesium: 1.8 mg/dL (ref 1.7–2.4)

## 2017-05-10 MED ORDER — SODIUM CHLORIDE 0.9% FLUSH
10.0000 mL | Freq: Once | INTRAVENOUS | Status: AC
  Administered 2017-05-10: 10 mL via INTRAVENOUS
  Filled 2017-05-10: qty 10

## 2017-05-10 MED ORDER — SODIUM CHLORIDE 0.9 % IV SOLN
Freq: Once | INTRAVENOUS | Status: AC
  Administered 2017-05-10: 15:00:00 via INTRAVENOUS
  Filled 2017-05-10: qty 4

## 2017-05-10 MED ORDER — SODIUM CHLORIDE 0.9 % IV SOLN
Freq: Once | INTRAVENOUS | Status: AC
  Administered 2017-05-10: 14:00:00 via INTRAVENOUS
  Filled 2017-05-10: qty 1000

## 2017-05-10 MED ORDER — SODIUM CHLORIDE 0.9 % IV SOLN
500.0000 mg/m2 | Freq: Once | INTRAVENOUS | Status: AC
  Administered 2017-05-10: 1050 mg via INTRAVENOUS
  Filled 2017-05-10: qty 42

## 2017-05-10 MED ORDER — HEPARIN SOD (PORK) LOCK FLUSH 100 UNIT/ML IV SOLN
500.0000 [IU] | Freq: Once | INTRAVENOUS | Status: AC
  Administered 2017-05-10: 500 [IU] via INTRAVENOUS
  Filled 2017-05-10: qty 5

## 2017-05-10 NOTE — Progress Notes (Signed)
Mitchell Mosley returns to clinic today for consideration of cycle #604, Alimta infusion on the [employer] research study. VS are stable. His weight is stable at 91.8kg (202 lbs). Patient reports his symptoms of cough and congestion have returned as of 05/07/17 but Dr.Rao decided not to retreat with antibiotics after examining the patient. The itching on his upper right arm, right palm, and bottom of left foot that was reported at his last visit has remained the same.  Dr.Brahamanday recommended OTC hydrocortisone cream for this at his previous visit, but the patient has not tried it yet.  The patient also continues to have trace edema in his BLE and takes Lasix and Spironolactone daily for the edema.  Patient does not report any change in his fatigue, but states that his tendonitis is slightly worse due to overuse of his wrist.  Mitchell Mosley continues to take medication for the tendonitis.  Platelet count WNL today and patient's glucose is elevated at 161, non fasting. CBC and other chemistries are within acceptable parameters for treatment.  Dr. Janese Banks plans to administer Alimta infusion today as scheduled. Adverse events with grade and attribution as follows:    Adverse Event Log Study/Protocol: [name] S130 Cycle: 602 Event Grade Onset Date Resolved Date Drug Name Pemetrexed Attribution Treatment Comments  Edema (mild) Grade 1 05/30/12 ongoing  Possible  Trace edema in ble up to calf  Fatigue Grade1 02/03/16 ongoing  Possible    Tendonitis Grade 1 09/21/16 ongoing  Unrelated None   Hyponatremia Grade 1 04/19/17   Possible  Sodium 132  Pruritis Grade 1 04/19/17   Possible    Cough/Congestion Grade 2 6518   Unrelated Physician decided not to retreat with antibiotics   Raynelle Dick, RN BSN "05/10/2017 2:14 PM"

## 2017-05-12 ENCOUNTER — Encounter: Payer: Self-pay | Admitting: Oncology

## 2017-05-12 NOTE — Progress Notes (Signed)
Hematology/Oncology Consult note [facility]  [phone] [phone]  Patient Care Team: [name], MD as PCP - General (Family Medicine)   Name of the patient: Mitchell Mosley  [id number]  [birth year]   Date of visit: 05/12/17  Diagnosis- Stage IV adenocarcinoma of the lung. NED on maintenance alimta since 2012  Chief complaint/ Reason for visit- on treatment assessment prior to next cycle of opdivo  Heme/Onc history:  Oncology History   # 2012- METASTATIC ADENO CA of LEFT LUNG [acinar pattern] s/p MED LN Bx [NEG- EGFR/K-ras/ALK mutation];[name]; [name]; CT JULY 2016- NED; JAN 2018- NED  # Thoracic Aneurysm [4cm- stable]     Cancer of hilus of left lung ([place])   07/20/2016 Initial Diagnosis    Cancer of hilus of left lung ([place])       Interval history- doing well. Denies any complaints. He is on folic acid. He is active and continues to play tennis  ECOG PS- 0 Pain scale- 0   Review of systems- Review of Systems  Constitutional: Negative for chills, fever, malaise/fatigue and weight loss.  HENT: Negative for congestion, ear discharge and nosebleeds.   Eyes: Negative for blurred vision.  Respiratory: Negative for cough, hemoptysis, sputum production, shortness of breath and wheezing.   Cardiovascular: Negative for chest pain, palpitations, orthopnea and claudication.  Gastrointestinal: Negative for abdominal pain, blood in stool, constipation, diarrhea, heartburn, melena, nausea and vomiting.  Genitourinary: Negative for dysuria, flank pain, frequency, hematuria and urgency.  Musculoskeletal: Negative for back pain, joint pain and myalgias.  Skin: Negative for rash.  Neurological: Negative for dizziness, tingling, focal weakness, seizures, weakness and headaches.  Endo/Heme/Allergies: Does not bruise/bleed easily.   Psychiatric/Behavioral: Negative for depression and suicidal ideas. The patient does not have insomnia.       Allergies  Allergen Reactions  . Penicillins     Reaction and severity unknown     Past Medical History:  Diagnosis Date  . Allergy   . Lung cancer ([place])   . Mild valvular heart disease Nov. 2014   per 2 D Echocardiogram done for persistent leg edema, monitored by cardiology  . Peripheral edema      Past Surgical History:  Procedure Laterality Date  . ELBOW SURGERY    . Left anterior thoracotomy with biopsy  March 2012  . PORTACATH PLACEMENT  March 2012  . TONSILLECTOMY      Social History   Social History  . Marital status: Married    Spouse name: N/A  . Number of children: N/A  . Years of education: N/A   Occupational History  . Not on file.   Social History Main Topics  . Smoking status: Never Smoker  . Smokeless tobacco: Never Used  . Alcohol use 0.0 oz/week     Comment: "social drinker"   . Drug use: No  . Sexual activity: Yes   Other Topics Concern  . Not on file   Social History Narrative  . No narrative on file    Family History  Problem Relation Age of Onset  . Leukemia Father   . Diabetes Mother      Current Outpatient Prescriptions:  .  folic acid (FOLVITE) 1 MG tablet, TAKE 1 TABLET (1 MG TOTAL) BY MOUTH DAILY., Disp: 30 tablet, Rfl: 2 .  furosemide (LASIX) 20 MG tablet, TAKE 1 TABLET (20 MG TOTAL) BY MOUTH DAILY. ONE  TABLET DAILY AS NEEDED FOR SWELLING, Disp: 90 tablet, Rfl: 1 .  loratadine (CLARITIN) 10 MG tablet, Take 1 tablet (10 mg total) by mouth daily., Disp: , Rfl:  .  meloxicam (MOBIC) 7.5 MG tablet, Take 7.5 mg by mouth daily. , Disp: , Rfl:  .  Multiple Vitamin (MULTIVITAMIN) capsule, Take 1 capsule by mouth daily. Reported on 02/24/2016, Disp: , Rfl:  .  Omega-3 Fatty Acids (FISH OIL) 1000 MG CAPS, Take 1 capsule by mouth daily. Reported on 02/24/2016, Disp: , Rfl:  .  ondansetron (ZOFRAN-ODT) 4 MG disintegrating  tablet, Take 1 tablet (4 mg total) by mouth every 4 (four) hours as needed for nausea or vomiting., Disp: 20 tablet, Rfl: 2 .  senna (SENOKOT) 8.6 MG tablet, Take 1 tablet by mouth daily. Take as needed around time of chemotherapy, Disp: , Rfl:  .  spironolactone (ALDACTONE) 25 MG tablet, Take 1 tablet (25 mg total) by mouth daily., Disp: 90 tablet, Rfl: 3 .  vitamin B-12 (CYANOCOBALAMIN) 1000 MCG tablet, Take 1,000 mcg by mouth daily., Disp: , Rfl:  No current facility-administered medications for this visit.   Facility-Administered Medications Ordered in Other Visits:  .  sodium chloride 0.9 % injection 10 mL, 10 mL, Intracatheter, PRN, [name], MD, 10 mL at 05/27/15 1543 .  sodium chloride 0.9 % injection 10 mL, 10 mL, Intracatheter, PRN, [name], MD, 10 mL at 06/17/15 1510 .  sodium chloride 0.9 % injection 10 mL, 10 mL, Intravenous, PRN, [name], MD, 10 mL at 07/08/15 1340  Physical exam:  Vitals:   05/10/17 1347  BP: 116/79  Pulse: 63  Resp: 16  Temp: 97 F (36.1 C)  TempSrc: Tympanic  SpO2: 98%  Weight: 202 lb (91.6 kg)   Physical Exam  Constitutional: He is oriented to person, place, and time and well-developed, well-nourished, and in no distress.  HENT:  Head: Normocephalic and atraumatic.  Eyes: EOM are normal. Pupils are equal, round, and reactive to light.  Neck: Normal range of motion.  Cardiovascular: Normal rate, regular rhythm and normal heart sounds.   Pulmonary/Chest: Effort normal and breath sounds normal.  Abdominal: Soft. Bowel sounds are normal.  Musculoskeletal: He exhibits edema (trace b/l).  Neurological: He is alert and oriented to person, place, and time.  Skin: Skin is warm and dry.     CMP Latest Ref Rng & Units 05/10/2017  Glucose 65 - 99 mg/dL 161(H)  BUN 6 - 20 mg/dL 20  Creatinine 0.61 - 1.24 mg/dL 0.98  Sodium 135 - 145 mmol/L 132(L)  Potassium 3.5 - 5.1 mmol/L 3.5  Chloride 101 - 111 mmol/L 98(L)  CO2 22 - 32 mmol/L 28   Calcium 8.9 - 10.3 mg/dL 9.0  Total Protein 6.5 - 8.1 g/dL 6.8  Total Bilirubin 0.3 - 1.2 mg/dL 1.0  Alkaline Phos 38 - 126 U/L 39  AST 15 - 41 U/L 26  ALT 17 - 63 U/L 18   CBC Latest Ref Rng & Units 05/10/2017  WBC 3.8 - 10.6 K/uL 4.2  Hemoglobin 13.0 - 18.0 g/dL 13.1  Hematocrit 40.0 - 52.0 % 37.2(L)  Platelets 150 - 440 K/uL 153     Assessment and plan- Patient is a 56 y.o. male stage IV adenocarcinoma of the lung on maintenance alimta NED since 2012  Counts ok to proceed with next cycle of alimta. He erceived b12 last month and will get next dose after 6 weeks. Continue folic acid  Hyponatremia- mild and chronic. Continue  to monitor  B/l LE edema- stable on diretics  rtc in 3 weeks with labs for next dose of alimta and see Dr. Rogue Bussing   Visit Diagnosis 1. Cancer of hilus of left lung ([place])   2. Encounter for antineoplastic chemotherapy      Dr. Randa Evens, MD, MPH [facility] at [facility] Pager- [phone] 05/12/2017 8:28 AM

## 2017-05-27 DIAGNOSIS — R609 Edema, unspecified: Secondary | ICD-10-CM | POA: Diagnosis not present

## 2017-05-31 ENCOUNTER — Encounter: Payer: Self-pay | Admitting: *Deleted

## 2017-05-31 ENCOUNTER — Inpatient Hospital Stay: Payer: BLUE CROSS/BLUE SHIELD

## 2017-05-31 ENCOUNTER — Inpatient Hospital Stay (HOSPITAL_BASED_OUTPATIENT_CLINIC_OR_DEPARTMENT_OTHER): Payer: BLUE CROSS/BLUE SHIELD | Admitting: Internal Medicine

## 2017-05-31 VITALS — BP 115/74 | HR 69 | Temp 97.3°F | Resp 16 | Wt 201.4 lb

## 2017-05-31 DIAGNOSIS — Z006 Encounter for examination for normal comparison and control in clinical research program: Secondary | ICD-10-CM | POA: Diagnosis not present

## 2017-05-31 DIAGNOSIS — C3490 Malignant neoplasm of unspecified part of unspecified bronchus or lung: Secondary | ICD-10-CM

## 2017-05-31 DIAGNOSIS — C33 Malignant neoplasm of trachea: Secondary | ICD-10-CM

## 2017-05-31 DIAGNOSIS — I712 Thoracic aortic aneurysm, without rupture: Secondary | ICD-10-CM | POA: Diagnosis not present

## 2017-05-31 DIAGNOSIS — C3480 Malignant neoplasm of overlapping sites of unspecified bronchus and lung: Principal | ICD-10-CM

## 2017-05-31 DIAGNOSIS — C3402 Malignant neoplasm of left main bronchus: Secondary | ICD-10-CM

## 2017-05-31 DIAGNOSIS — C7802 Secondary malignant neoplasm of left lung: Secondary | ICD-10-CM

## 2017-05-31 DIAGNOSIS — R600 Localized edema: Secondary | ICD-10-CM

## 2017-05-31 DIAGNOSIS — E871 Hypo-osmolality and hyponatremia: Secondary | ICD-10-CM

## 2017-05-31 DIAGNOSIS — Z5111 Encounter for antineoplastic chemotherapy: Secondary | ICD-10-CM | POA: Diagnosis not present

## 2017-05-31 LAB — COMPREHENSIVE METABOLIC PANEL
ALT: 17 U/L (ref 17–63)
AST: 25 U/L (ref 15–41)
Albumin: 4.2 g/dL (ref 3.5–5.0)
Alkaline Phosphatase: 46 U/L (ref 38–126)
Anion gap: 7 (ref 5–15)
BILIRUBIN TOTAL: 0.9 mg/dL (ref 0.3–1.2)
BUN: 21 mg/dL — ABNORMAL HIGH (ref 6–20)
CHLORIDE: 99 mmol/L — AB (ref 101–111)
CO2: 28 mmol/L (ref 22–32)
CREATININE: 1.01 mg/dL (ref 0.61–1.24)
Calcium: 9.3 mg/dL (ref 8.9–10.3)
Glucose, Bld: 119 mg/dL — ABNORMAL HIGH (ref 65–99)
Potassium: 4 mmol/L (ref 3.5–5.1)
Sodium: 134 mmol/L — ABNORMAL LOW (ref 135–145)
TOTAL PROTEIN: 7.3 g/dL (ref 6.5–8.1)

## 2017-05-31 LAB — CBC WITH DIFFERENTIAL/PLATELET
Basophils Absolute: 0 10*3/uL (ref 0–0.1)
Basophils Relative: 1 %
EOS PCT: 6 %
Eosinophils Absolute: 0.3 10*3/uL (ref 0–0.7)
HCT: 40.4 % (ref 40.0–52.0)
Hemoglobin: 14.3 g/dL (ref 13.0–18.0)
LYMPHS ABS: 1.4 10*3/uL (ref 1.0–3.6)
LYMPHS PCT: 32 %
MCH: 32.9 pg (ref 26.0–34.0)
MCHC: 35.3 g/dL (ref 32.0–36.0)
MCV: 93.1 fL (ref 80.0–100.0)
MONO ABS: 0.4 10*3/uL (ref 0.2–1.0)
Monocytes Relative: 9 %
Neutro Abs: 2.2 10*3/uL (ref 1.4–6.5)
Neutrophils Relative %: 52 %
PLATELETS: 162 10*3/uL (ref 150–440)
RBC: 4.35 MIL/uL — AB (ref 4.40–5.90)
RDW: 13.9 % (ref 11.5–14.5)
WBC: 4.2 10*3/uL (ref 3.8–10.6)

## 2017-05-31 MED ORDER — SODIUM CHLORIDE 0.9 % IV SOLN
500.0000 mg/m2 | Freq: Once | INTRAVENOUS | Status: AC
  Administered 2017-05-31: 1050 mg via INTRAVENOUS
  Filled 2017-05-31: qty 42

## 2017-05-31 MED ORDER — SODIUM CHLORIDE 0.9 % IV SOLN
Freq: Once | INTRAVENOUS | Status: AC
  Administered 2017-05-31: 09:00:00 via INTRAVENOUS
  Filled 2017-05-31: qty 1000

## 2017-05-31 MED ORDER — DEXAMETHASONE SODIUM PHOSPHATE 100 MG/10ML IJ SOLN
Freq: Once | INTRAMUSCULAR | Status: AC
  Administered 2017-05-31: 09:00:00 via INTRAVENOUS
  Filled 2017-05-31: qty 4

## 2017-05-31 MED ORDER — HEPARIN SOD (PORK) LOCK FLUSH 100 UNIT/ML IV SOLN
500.0000 [IU] | Freq: Once | INTRAVENOUS | Status: AC | PRN
  Administered 2017-05-31: 500 [IU]

## 2017-05-31 NOTE — Assessment & Plan Note (Addendum)
Stage IV NED. Patient currently on Alimta maintenance since 2012. April 25th 2018- CT chest abdomen pelvis NED; except left lower lobe groundglass opacities [C discussion below].  No clinical evidence of progression at this time.   #  Proceed with chemo/ alimta today;  Labs today reviewed;  acceptable for treatment today.   # Hyponatremia- sodium 134. improved. Monitor for now.   # lower lobe groundglass opacities- asymptomatic. Monitor for now.  # Bil LE edema Grade 1; continue Lasix/aldactone daily.   # Follow-up with me in 3 weeks labs chemotherapy; 6 weeks. Discussed with clinical trials RN; scans ordered for 6 weeks from now.

## 2017-05-31 NOTE — Progress Notes (Signed)
[place] OFFICE PROGRESS NOTE  Patient Care Team: [name], [name], stage 4 [facility])   Staging form: Lung, AJCC 7th Edition     Clinical stage from 05/24/2015: Stage IV ([place], NX, [name], MD on 05/24/2015    Oncology History   # 2012- METASTATIC ADENO CA of LEFT LUNG [acinar pattern] s/p MED LN Bx [NEG- EGFR/K-ras/ALK mutation];[name]; [name]; CT JULY 2016- NED; JAN 2018- NED  # Thoracic Aneurysm [4cm- stable]     Cancer of hilus of left lung ([place])   07/20/2016 Initial Diagnosis    Cancer of hilus of left lung ([place])       INTERVAL HISTORY:  [name] with above history of metastatic adenocarcinoma the lung currently on maintenance Alimta since June 2012 is here for follow-up prior to his chemotherapy.  In the interim he was evaluated by cardiology- regular checkup. No intervention is recommended.  He currently feels well. Patient denies any significant cough. Denies any significant shortness of breath. Appetite is good. No weight loss.Denies any headaches.   Denies any vision changes. He continues to be very active.  Mild intermittent swelling in the legs for which he takes Lasix and spironolactone.   REVIEW OF SYSTEMS:  A complete 10 point review of system is done which is negative except mentioned above/history of present illness.   PAST MEDICAL HISTORY :  Past Medical History:  Diagnosis Date  . Allergy   . Lung cancer ([place])   . Mild valvular heart disease Nov. 2014   per 2 D Echocardiogram done for persistent leg edema, monitored by cardiology  . Peripheral edema     PAST SURGICAL HISTORY :   Past Surgical History:  Procedure Laterality Date  . ELBOW SURGERY    . Left anterior thoracotomy with biopsy  March  2012  . PORTACATH PLACEMENT  March 2012  . TONSILLECTOMY      FAMILY HISTORY :   Family History  Problem Relation Age of Onset  . Leukemia Father   . Diabetes Mother     SOCIAL HISTORY:   Social History  Substance Use Topics  . Smoking status: Never Smoker  . Smokeless tobacco: Never Used  . Alcohol use 0.0 oz/week     Comment: "social drinker"     ALLERGIES:  is allergic to penicillins.  MEDICATIONS:  Current Outpatient Prescriptions  Medication Sig Dispense Refill  . folic acid (FOLVITE) 1 MG tablet TAKE 1 TABLET (1 MG TOTAL) BY MOUTH DAILY. 30 tablet 2  . furosemide (LASIX) 20 MG tablet TAKE 1 TABLET (20 MG TOTAL) BY MOUTH DAILY. ONE TABLET DAILY AS NEEDED FOR SWELLING 90 tablet 1  . loratadine (CLARITIN) 10 MG tablet Take 1 tablet (10 mg total) by mouth daily.    . meloxicam (MOBIC) 7.5 MG tablet Take 7.5 mg by mouth daily.     . Multiple Vitamin (MULTIVITAMIN) capsule Take 1 capsule by mouth daily. Reported on 02/24/2016    . Omega-3 Fatty Acids (FISH OIL) 1000 MG CAPS Take 1 capsule by mouth daily. Reported on 02/24/2016    . ondansetron (ZOFRAN-ODT) 4 MG disintegrating tablet Take 1 tablet (4 mg total) by mouth every 4 (four) hours as needed for nausea or vomiting. 20 tablet 2  . senna (SENOKOT) 8.6 MG tablet Take  1 tablet by mouth daily. Take as needed around time of chemotherapy    . spironolactone (ALDACTONE) 25 MG tablet Take 1 tablet (25 mg total) by mouth daily. 90 tablet 3  . vitamin B-12 (CYANOCOBALAMIN) 1000 MCG tablet Take 1,000 mcg by mouth daily.     No current facility-administered medications for this visit.    Facility-Administered Medications Ordered in Other Visits  Medication Dose Route Frequency Provider Last Rate Last Dose  . heparin lock flush 100 unit/mL  500 Units Intracatheter Once PRN [name], MD      . INV-PEMEtrexed Lilly S130 (ALIMTA) 1,050 mg in sodium chloride 0.9 % 100 mL chemo infusion  500 mg/m2 (Treatment Plan Recorded)  Intravenous Once Charlaine Dalton R, [name], MD   10 mL at 05/27/15 1543  . sodium chloride 0.9 % injection 10 mL  10 mL Intracatheter PRN [name], MD   10 mL at 06/17/15 1510  . sodium chloride 0.9 % injection 10 mL  10 mL Intravenous PRN [name], MD   10 mL at 07/08/15 1340    PHYSICAL EXAMINATION: ECOG PERFORMANCE STATUS: 0 - Asymptomatic  BP 115/74 (BP Location: Left Arm, Patient Position: Sitting)   Pulse 69   Temp 97.3 F (36.3 C) (Tympanic)   Resp 16   Wt 201 lb 6 oz (91.3 kg)   SpO2 99%   BMI 28.89 kg/m   Filed Weights   05/31/17 0827  Weight: 201 lb 6 oz (91.3 kg)     GENERAL: Well-nourished well-developed; Alert, [name]; good dentition  NECK: supple, [name], [name]; 1+ bilateral lower extremity swelling. ABDOMEN:abdomen soft, [name];  LABORATORY DATA:  I have reviewed the data as listed    Component Value Date/Time   NA 134 (L) 05/31/2017 0800   NA 138 12/10/2014 1341   K 4.0 05/31/2017 0800   K 3.6 12/10/2014 1341   CL 99 (L) 05/31/2017 0800   CL 102 12/10/2014 1341   CO2 28 05/31/2017 0800   CO2 30 12/10/2014 1341   GLUCOSE 119 (H) 05/31/2017 0800   GLUCOSE 160 (H) 12/10/2014 1341   BUN 21 (H) 05/31/2017 0800   BUN 21 (H) 12/10/2014 1341   CREATININE 1.01 05/31/2017 0800   CREATININE 1.10 03/25/2015 1453   CALCIUM 9.3 05/31/2017 0800   CALCIUM 8.4 (L) 12/10/2014 1341    PROT 7.3 05/31/2017 0800   PROT 6.6 12/10/2014 1341   ALBUMIN 4.2 05/31/2017 0800   ALBUMIN 3.4 12/10/2014 1341   AST 25 05/31/2017 0800   AST 17 12/10/2014 1341   ALT 17 05/31/2017 0800   ALT 23 12/10/2014 1341   ALKPHOS 46 05/31/2017 0800   ALKPHOS 50 12/10/2014 1341   BILITOT 0.9 05/31/2017 0800   BILITOT 0.5 12/10/2014 1341   GFRNONAA >60 05/31/2017 0800   GFRNONAA >60 03/25/2015 1453   GFRAA >60 05/31/2017 0800   GFRAA >60 03/25/2015 1453    No  results found for: SPEP, UPEP  Lab Results  Component Value Date   WBC 4.2 05/31/2017   NEUTROABS 2.2 05/31/2017   HGB 14.3 05/31/2017   HCT 40.4 05/31/2017   MCV 93.1 05/31/2017   PLT 162 05/31/2017      Chemistry      Component Value Date/Time   NA 134 (L) 05/31/2017 0800   NA 138 12/10/2014 1341   K 4.0 05/31/2017 0800   K 3.6 12/10/2014 1341   CL 99 (L) 05/31/2017 0800   CL 102 12/10/2014 1341   CO2 28 05/31/2017 0800   CO2 30 12/10/2014 1341   BUN 21 (H) 05/31/2017 0800   BUN 21 (H) 12/10/2014 1341   CREATININE 1.01 05/31/2017 0800   CREATININE 1.10 03/25/2015 1453      Component Value Date/Time   CALCIUM 9.3 05/31/2017 0800   CALCIUM 8.4 (L) 12/10/2014 1341   ALKPHOS 46 05/31/2017 0800   ALKPHOS 50 12/10/2014 1341   AST 25 05/31/2017 0800   AST 17 12/10/2014 1341   ALT 17 05/31/2017 0800   ALT 23 12/10/2014 1341   BILITOT 0.9 05/31/2017 0800   BILITOT 0.5 12/10/2014 1341       RADIOGRAPHIC STUDIES: I have personally reviewed the radiological images as listed and agreed with the findings in the report. No results found.   ASSESSMENT & PLAN:  Cancer of hilus of left lung ([place]) Stage IV NED. Patient currently on Alimta maintenance since 2012. April 25th 2018- CT chest abdomen pelvis NED; except left lower lobe groundglass opacities [C discussion below].  No clinical evidence of progression at this time.   #  Proceed with chemo/ alimta today;  Labs today reviewed;  acceptable for treatment today.    # Hyponatremia- sodium 134. improved. Monitor for now.   # lower lobe groundglass opacities- asymptomatic. Monitor for now.  # Bil LE edema Grade 1; continue Lasix/aldactone daily.   # Follow-up with me in 3 weeks labs chemotherapy; 6 weeks. Discussed with clinical trials RN; scans ordered for 6 weeks from now.   No orders of the defined types were placed in this encounter.     [name], MD 05/31/2017 9:34 AM

## 2017-05-31 NOTE — Progress Notes (Signed)
Patient here today for follow up.  Patient states no new concerns today  

## 2017-05-31 NOTE — Progress Notes (Signed)
Mitchell Mosley returns to clinic today for consideration of cycle #605, Alimta infusion on the [employer] research study. VS are stable. His weight is stable at 91.3kg (201 lbs). Cough and congestion has resolved since last visit. The patient also continues to have trace edema in his BLE and takes Lasix and Spironolactone daily for the edema.  Patient does not report any change in his fatigue, but states that his tendonitis is about the same since last visit due to overuse of his wrist. States he is using some new equipment to see if this makes a difference.  Mitchell Mosley continues to take medication for the tendonitis.  Platelet count WNL today and patient's glucose is elevated at 119 which is less than last visit's 161. CBC and other chemistries are within acceptable parameters for treatment.  Dr. Rogue Bussing plans to administer Alimta infusion today as scheduled.  CT scans of abdomen and chest with contrast ordered for 07/08/2017. Adverse events with grade and attribution as follows:    Adverse Event Log Study/Protocol: [name] S130 Cycle: 604 Event Grade Onset Date Resolved Date Drug Name Pemetrexed Attribution Treatment Comments  Edema (mild) Grade 1 05/30/12 ongoing  Possible  Trace edema in ble up to calf  Fatigue Grade1 02/03/16 ongoing  Possible    Tendonitis Grade 1 09/21/16 ongoing  Unrelated None   Hyponatremia Grade 1 04/19/17 ongoing  Possible  Sodium 132  Pruritis Grade 1 04/19/17 ongoing  Possible OTC hydrocortisone cream, but patient has not used.   Cough/Congestion Grade 2 05/31/17 Resolved  Unrelated    Raynelle Dick, RN BSN "05/31/2017 9:25 AM"

## 2017-06-18 ENCOUNTER — Other Ambulatory Visit: Payer: Self-pay | Admitting: Internal Medicine

## 2017-06-18 DIAGNOSIS — C3402 Malignant neoplasm of left main bronchus: Secondary | ICD-10-CM

## 2017-06-18 NOTE — Telephone Encounter (Signed)
-----   Message from Peggye Fothergill, RN sent at 06/18/2017 10:24 AM EDT ----- Regarding: prescription renewal Mitchell Mosley called and said he needs refills authorized for his folic acid. He called to get his script filled and they told him he has no more refills. Thanks, [employer]

## 2017-06-18 NOTE — Telephone Encounter (Signed)
rx renewal for folic acid submitted to pt's pharmacy

## 2017-06-21 ENCOUNTER — Encounter: Payer: Self-pay | Admitting: *Deleted

## 2017-06-21 ENCOUNTER — Inpatient Hospital Stay: Payer: BLUE CROSS/BLUE SHIELD

## 2017-06-21 ENCOUNTER — Inpatient Hospital Stay: Payer: BLUE CROSS/BLUE SHIELD | Attending: Internal Medicine | Admitting: Internal Medicine

## 2017-06-21 VITALS — BP 116/77 | HR 68 | Temp 96.0°F | Resp 16 | Wt 204.0 lb

## 2017-06-21 DIAGNOSIS — I38 Endocarditis, valve unspecified: Secondary | ICD-10-CM | POA: Insufficient documentation

## 2017-06-21 DIAGNOSIS — Z5111 Encounter for antineoplastic chemotherapy: Secondary | ICD-10-CM | POA: Diagnosis not present

## 2017-06-21 DIAGNOSIS — Z9181 History of falling: Secondary | ICD-10-CM | POA: Insufficient documentation

## 2017-06-21 DIAGNOSIS — C3491 Malignant neoplasm of unspecified part of right bronchus or lung: Secondary | ICD-10-CM | POA: Insufficient documentation

## 2017-06-21 DIAGNOSIS — C3480 Malignant neoplasm of overlapping sites of unspecified bronchus and lung: Secondary | ICD-10-CM

## 2017-06-21 DIAGNOSIS — C7802 Secondary malignant neoplasm of left lung: Secondary | ICD-10-CM | POA: Insufficient documentation

## 2017-06-21 DIAGNOSIS — C3402 Malignant neoplasm of left main bronchus: Secondary | ICD-10-CM

## 2017-06-21 DIAGNOSIS — Z006 Encounter for examination for normal comparison and control in clinical research program: Secondary | ICD-10-CM | POA: Diagnosis not present

## 2017-06-21 DIAGNOSIS — R600 Localized edema: Secondary | ICD-10-CM

## 2017-06-21 DIAGNOSIS — Z95828 Presence of other vascular implants and grafts: Secondary | ICD-10-CM

## 2017-06-21 DIAGNOSIS — C33 Malignant neoplasm of trachea: Secondary | ICD-10-CM

## 2017-06-21 DIAGNOSIS — E871 Hypo-osmolality and hyponatremia: Secondary | ICD-10-CM | POA: Diagnosis not present

## 2017-06-21 DIAGNOSIS — D696 Thrombocytopenia, unspecified: Secondary | ICD-10-CM

## 2017-06-21 DIAGNOSIS — I712 Thoracic aortic aneurysm, without rupture: Secondary | ICD-10-CM

## 2017-06-21 DIAGNOSIS — Z79899 Other long term (current) drug therapy: Secondary | ICD-10-CM

## 2017-06-21 LAB — COMPREHENSIVE METABOLIC PANEL
ALBUMIN: 4.1 g/dL (ref 3.5–5.0)
ALT: 19 U/L (ref 17–63)
ANION GAP: 7 (ref 5–15)
AST: 24 U/L (ref 15–41)
Alkaline Phosphatase: 45 U/L (ref 38–126)
BILIRUBIN TOTAL: 0.6 mg/dL (ref 0.3–1.2)
BUN: 25 mg/dL — AB (ref 6–20)
CHLORIDE: 98 mmol/L — AB (ref 101–111)
CO2: 29 mmol/L (ref 22–32)
Calcium: 9.5 mg/dL (ref 8.9–10.3)
Creatinine, Ser: 1.25 mg/dL — ABNORMAL HIGH (ref 0.61–1.24)
GFR calc Af Amer: >60 mL/min (ref >60)
GFR calc non Af Amer: >60 mL/min (ref >60)
GLUCOSE: 148 mg/dL — AB (ref 65–99)
POTASSIUM: 3.9 mmol/L (ref 3.5–5.1)
SODIUM: 134 mmol/L — AB (ref 135–145)
TOTAL PROTEIN: 7 g/dL (ref 6.5–8.1)

## 2017-06-21 LAB — CBC WITH DIFFERENTIAL/PLATELET
BASOS ABS: 0 10*3/uL (ref 0–0.1)
BASOS PCT: 1 %
EOS ABS: 0.1 10*3/uL (ref 0–0.7)
EOS PCT: 3 %
HCT: 38.4 % — ABNORMAL LOW (ref 40.0–52.0)
Hemoglobin: 13.6 g/dL (ref 13.0–18.0)
Lymphocytes Relative: 30 %
Lymphs Abs: 1.4 10*3/uL (ref 1.0–3.6)
MCH: 33.6 pg (ref 26.0–34.0)
MCHC: 35.5 g/dL (ref 32.0–36.0)
MCV: 94.7 fL (ref 80.0–100.0)
MONO ABS: 0.5 10*3/uL (ref 0.2–1.0)
MONOS PCT: 12 %
NEUTROS ABS: 2.5 10*3/uL (ref 1.4–6.5)
Neutrophils Relative %: 54 %
PLATELETS: 163 10*3/uL (ref 150–440)
RBC: 4.05 MIL/uL — ABNORMAL LOW (ref 4.40–5.90)
RDW: 14 % (ref 11.5–14.5)
WBC: 4.6 10*3/uL (ref 3.8–10.6)

## 2017-06-21 MED ORDER — SODIUM CHLORIDE 0.9% FLUSH
10.0000 mL | Freq: Once | INTRAVENOUS | Status: AC
  Administered 2017-06-21: 10 mL via INTRAVENOUS
  Filled 2017-06-21: qty 10

## 2017-06-21 MED ORDER — SODIUM CHLORIDE 0.9 % IV SOLN
Freq: Once | INTRAVENOUS | Status: AC
  Administered 2017-06-21: 10:00:00 via INTRAVENOUS
  Filled 2017-06-21: qty 4

## 2017-06-21 MED ORDER — CYANOCOBALAMIN 1000 MCG/ML IJ SOLN
1000.0000 ug | Freq: Once | INTRAMUSCULAR | Status: AC
  Administered 2017-06-21: 1000 ug via INTRAMUSCULAR
  Filled 2017-06-21: qty 1

## 2017-06-21 MED ORDER — SODIUM CHLORIDE 0.9 % IV SOLN
500.0000 mg/m2 | Freq: Once | INTRAVENOUS | Status: AC
  Administered 2017-06-21: 1050 mg via INTRAVENOUS
  Filled 2017-06-21: qty 42

## 2017-06-21 MED ORDER — HEPARIN SOD (PORK) LOCK FLUSH 100 UNIT/ML IV SOLN: 500.0000 [IU] | Freq: Once | INTRAVENOUS | Status: DC | PRN

## 2017-06-21 MED ORDER — HEPARIN SOD (PORK) LOCK FLUSH 100 UNIT/ML IV SOLN
500.0000 [IU] | Freq: Once | INTRAVENOUS | Status: AC
  Administered 2017-06-21: 500 [IU] via INTRAVENOUS

## 2017-06-21 MED ORDER — SODIUM CHLORIDE 0.9 % IV SOLN
Freq: Once | INTRAVENOUS | Status: AC
  Administered 2017-06-21: 10:00:00 via INTRAVENOUS
  Filled 2017-06-21: qty 1000

## 2017-06-21 NOTE — Progress Notes (Signed)
[name] returns to clinic today for consideration of cycle #606, Alimta infusion on the [employer] research study. VS are stable. His weight is stable at 92.5kg (204 lbs). The patient continues to have trace edema in his BLE and takes Lasix and Spironolactone daily for the edema.  Patient does not report any change in his fatigue, but states that his tendonitis is slightly improved since his last visit.  [name] continues to take medication for the tendonitis as needed. [name] had a new complaint of itching under the skin of his feet and palms.  Stated the itching is deep under the skin and occurs 3-4 times per day.  Dr.Brahmanday examined patient's feet and hands, no obvious rash and plans to observe for worsening of symptoms. [name] also reported that he was sitting on his exercise ball 2 weeks ago, when it deflated.  Patient landed on his coccyx and has had pain in that area since.  Difficult to sit on hard surfaces. This pain has not interfered with patient's ADLs. Patient's glucose is elevated at 148 which is higher than last visit's 119. CBC and other chemistries are within acceptable parameters for treatment.  Dr. Rogue Bussing plans to administer Alimta infusion today as scheduled and patient to get his B12 injection.  CT scans of abdomen and chest with contrast ordered for 07/08/2017. Adverse events with grade and attribution as follows:    Adverse Event Log Study/Protocol: [name] S130 Cycle: 605 Event Grade Onset Date Resolved Date Drug Name Pemetrexed Attribution Treatment Comments  Edema (mild) Grade 1 05/30/12 ongoing  Possible  Trace edema in ble up to calf  Fatigue Grade1 02/03/16 ongoing  Possible    Tendonitis Grade 1 09/21/16 ongoing  Unrelated None   Hyponatremia Grade 1 04/19/17 ongoing  Possible  Sodium 132  Pruritis Grade 1 04/19/17 ongoing  Possible None C/o deep itching under the skin of his palms and feet.   Cough/Congestion Grade 2 05/31/17 Resolved  Unrelated    Pain  Grade 1 06/07/17   Unrelated  [address] [address] on coccyx  Raynelle Dick, RN BSN "06/21/2017 8:40 AM"

## 2017-06-21 NOTE — Progress Notes (Signed)
Patient is today for a follow up. Patient states he was sitting on exercise ball at work and it popped - causing him to fall backwards. Patient states that since the incident, he has had some tail bone pain.

## 2017-06-21 NOTE — Assessment & Plan Note (Addendum)
Stage IV NED. Patient currently on Alimta maintenance since 2012. April 25th 2018- CT chest abdomen pelvis NED; except left lower lobe groundglass opacities [C discussion below].  No clinical evidence of progression at this time.   #  Proceed with chemo/ alimta today;  Labs today reviewed;  acceptable for treatment today.   # Hyponatremia- sodium 134. improved. Monitor for now.   # "Itchy feeling soles/palms"- monitor for now.   # pain in lower back s/p trauma- monitor for now.   # lower lobe groundglass opacities- asymptomatic. Await CT scans.   # Bil LE edema Grade 1; continue Lasix/aldactone daily.   # Follow-up with me in 3 weeks labs; scans prior chemotherapy; 6 weeks. Discussed with clinical trials RN.

## 2017-06-21 NOTE — Progress Notes (Signed)
[place] OFFICE PROGRESS NOTE  Patient Care Team: [name], [name], stage 4 [facility])   Staging form: Lung, AJCC 7th Edition     Clinical stage from 05/24/2015: Stage IV ([place], NX, [name], MD on 05/24/2015    Oncology History   # 2012- METASTATIC ADENO CA of LEFT LUNG [acinar pattern] s/p MED LN Bx [NEG- EGFR/K-ras/ALK mutation];[name]; [name]; CT JULY 2016- NED; JAN 2018- NED  # Thoracic Aneurysm [4cm- stable]     Cancer of hilus of left lung ([place])   07/20/2016 Initial Diagnosis    Cancer of hilus of left lung ([place])       INTERVAL HISTORY:  [name] with above history of metastatic adenocarcinoma the lung currently on maintenance Alimta since June 2012 is here for follow-up prior to his chemotherapy.  In the interim patient fell on his tailbone while at work. Pain is improving not resolved.   He currently feels well. Patient denies any significant cough. Denies any significant shortness of breath. Appetite is good. No weight loss. Denies any headaches.  Denies any vision changes. He continues to be very active.  Mild intermittent swelling in the legs for which he takes Lasix and spironolactone.   REVIEW OF SYSTEMS:  A complete 10 point review of system is done which is negative except mentioned above/history of present illness.   PAST MEDICAL HISTORY :  Past Medical History:  Diagnosis Date  . Allergy   . Lung cancer ([place])   . Mild valvular heart disease Nov. 2014   per 2 D Echocardiogram done for persistent leg edema, monitored by cardiology  . Peripheral edema     PAST SURGICAL HISTORY :   Past Surgical History:  Procedure Laterality Date  . ELBOW SURGERY    . Left anterior thoracotomy with biopsy  March 2012  .  PORTACATH PLACEMENT  March 2012  . TONSILLECTOMY      FAMILY HISTORY :   Family History  Problem Relation Age of Onset  . Leukemia Father   . Diabetes Mother     SOCIAL HISTORY:   Social History  Substance Use Topics  . Smoking status: Never Smoker  . Smokeless tobacco: Never Used  . Alcohol use 0.0 oz/week     Comment: "social drinker"     ALLERGIES:  is allergic to penicillins.  MEDICATIONS:  Current Outpatient Prescriptions  Medication Sig Dispense Refill  . folic acid (FOLVITE) 1 MG tablet TAKE 1 TABLET (1 MG TOTAL) BY MOUTH DAILY. 30 tablet 11  . furosemide (LASIX) 20 MG tablet TAKE 1 TABLET (20 MG TOTAL) BY MOUTH DAILY. ONE TABLET DAILY AS NEEDED FOR SWELLING 90 tablet 1  . loratadine (CLARITIN) 10 MG tablet Take 1 tablet (10 mg total) by mouth daily.    . meloxicam (MOBIC) 7.5 MG tablet Take 7.5 mg by mouth daily.     . Multiple Vitamin (MULTIVITAMIN) capsule Take 1 capsule by mouth daily. Reported on 02/24/2016    . Omega-3 Fatty Acids (FISH OIL) 1000 MG CAPS Take 1 capsule by mouth daily. Reported on 02/24/2016    . ondansetron (ZOFRAN-ODT) 4 MG disintegrating tablet Take 1 tablet (4 mg total) by mouth every 4 (four) hours as needed for nausea or vomiting. 20 tablet 2  . senna (SENOKOT) 8.6  MG tablet Take 1 tablet by mouth daily. Take as needed around time of chemotherapy    . spironolactone (ALDACTONE) 25 MG tablet Take 1 tablet (25 mg total) by mouth daily. 90 tablet 3  . vitamin B-12 (CYANOCOBALAMIN) 1000 MCG tablet Take 1,000 mcg by mouth daily.     No current facility-administered medications for this visit.    Facility-Administered Medications Ordered in Other Visits  Medication Dose Route Frequency Provider Last Rate Last Dose  . sodium chloride 0.9 % injection 10 mL  10 mL Intracatheter PRN [name], MD   10 mL at 05/27/15 1543  . sodium chloride 0.9 % injection 10 mL  10 mL Intracatheter PRN [name], MD   10 mL at 06/17/15 1510  . sodium  chloride 0.9 % injection 10 mL  10 mL Intravenous PRN [name], MD   10 mL at 07/08/15 1340    PHYSICAL EXAMINATION: ECOG PERFORMANCE STATUS: 0 - Asymptomatic  BP 116/77 (BP Location: Left Arm)   Pulse 68   Temp (!) 96 F (35.6 C) (Tympanic)   Resp 16   Wt 204 lb (92.5 kg)   BMI 29.27 kg/m   Filed Weights   06/21/17 0848  Weight: 204 lb (92.5 kg)     GENERAL: Well-nourished well-developed; Alert, [name]; good dentition  NECK: supple, [name], [name]; 1+ bilateral lower extremity swelling. ABDOMEN:abdomen soft, [name];  LABORATORY DATA:  I have reviewed the data as listed    Component Value Date/Time   NA 134 (L) 06/21/2017 0820   NA 138 12/10/2014 1341   K 3.9 06/21/2017 0820   K 3.6 12/10/2014 1341   CL 98 (L) 06/21/2017 0820   CL 102 12/10/2014 1341   CO2 29 06/21/2017 0820   CO2 30 12/10/2014 1341   GLUCOSE 148 (H) 06/21/2017 0820   GLUCOSE 160 (H) 12/10/2014 1341   BUN 25 (H) 06/21/2017 0820   BUN 21 (H) 12/10/2014 1341   CREATININE 1.25 (H) 06/21/2017 0820   CREATININE 1.10 03/25/2015 1453   CALCIUM 9.5 06/21/2017 0820   CALCIUM 8.4 (L) 12/10/2014 1341   PROT 7.0 06/21/2017 0820   PROT 6.6 12/10/2014 1341   ALBUMIN 4.1 06/21/2017 0820   ALBUMIN 3.4 12/10/2014 1341   AST 24 06/21/2017 0820   AST 17 12/10/2014 1341   ALT 19 06/21/2017 0820   ALT 23 12/10/2014 1341   ALKPHOS 45 06/21/2017 0820   ALKPHOS 50 12/10/2014 1341   BILITOT 0.6 06/21/2017 0820   BILITOT  0.5 12/10/2014 1341   GFRNONAA >60 06/21/2017 0820   GFRNONAA >60 03/25/2015 1453   GFRAA >60 06/21/2017 0820   GFRAA >60 03/25/2015 1453    No results found for: SPEP, UPEP  Lab Results  Component Value Date   WBC 4.6 06/21/2017   NEUTROABS 2.5 06/21/2017   HGB 13.6 06/21/2017   HCT 38.4 (L) 06/21/2017   MCV 94.7 06/21/2017   PLT 163 06/21/2017      Chemistry      Component Value Date/Time   NA  134 (L) 06/21/2017 0820   NA 138 12/10/2014 1341   K 3.9 06/21/2017 0820   K 3.6 12/10/2014 1341   CL 98 (L) 06/21/2017 0820   CL 102 12/10/2014 1341   CO2 29 06/21/2017 0820   CO2 30 12/10/2014 1341   BUN 25 (H) 06/21/2017 0820   BUN 21 (H) 12/10/2014 1341   CREATININE 1.25 (H) 06/21/2017 0820   CREATININE 1.10 03/25/2015 1453      Component Value Date/Time   CALCIUM 9.5 06/21/2017 0820   CALCIUM 8.4 (L) 12/10/2014 1341   ALKPHOS 45 06/21/2017 0820   ALKPHOS 50 12/10/2014 1341   AST 24 06/21/2017 0820   AST 17 12/10/2014 1341   ALT 19 06/21/2017 0820   ALT 23 12/10/2014 1341   BILITOT 0.6 06/21/2017 0820   BILITOT 0.5 12/10/2014 1341       RADIOGRAPHIC STUDIES: I have personally reviewed the radiological images as listed and agreed with the findings in the report. No results found.   ASSESSMENT & PLAN:  Cancer of hilus of left lung ([place]) Stage IV NED. Patient currently on Alimta maintenance since 2012. April 25th 2018- CT chest abdomen pelvis NED; except left lower lobe groundglass opacities [C discussion below].  No clinical evidence of progression at this time.   #  Proceed with chemo/ alimta today;  Labs today reviewed;  acceptable for treatment today.   # Hyponatremia- sodium 134. improved. Monitor for now.   # "Itchy feeling soles/palms"- monitor for now.   # pain in lower back s/p trauma- monitor for now.   # lower lobe groundglass opacities- asymptomatic. Await CT scans.   # Bil LE edema Grade 1; continue Lasix/aldactone daily.   # Follow-up with  me in 3 weeks labs; scans prior chemotherapy; 6 weeks. Discussed with clinical trials RN.   No orders of the defined types were placed in this encounter.     [name], MD 06/21/2017 6:02 PM

## 2017-06-26 ENCOUNTER — Other Ambulatory Visit: Payer: Self-pay

## 2017-06-26 ENCOUNTER — Telehealth: Payer: Self-pay

## 2017-06-26 DIAGNOSIS — Z1211 Encounter for screening for malignant neoplasm of colon: Secondary | ICD-10-CM

## 2017-06-26 NOTE — Telephone Encounter (Signed)
Gastroenterology Pre-Procedure Review  Request Date: 07/22/17 Requesting Physician: Dr. Allen Norris  PATIENT REVIEW QUESTIONS: The patient responded to the following health history questions as indicated:    1. Are you having any GI issues? no 2. Do you have a personal history of Polyps? no 3. Do you have a family history of Colon Cancer or Polyps? no 4. Diabetes Mellitus? no 5. Joint replacements in the past 12 months?no 6. Major health problems in the past 3 months?no 7. Any artificial heart valves, MVP, or defibrillator?no    MEDICATIONS & ALLERGIES:    Patient reports the following regarding taking any anticoagulation/antiplatelet therapy:   Plavix, Coumadin, Eliquis, Xarelto, Lovenox, Pradaxa, Brilinta, or Effient? no Aspirin? no  Patient confirms/reports the following medications:  Current Outpatient Prescriptions  Medication Sig Dispense Refill  . folic acid (FOLVITE) 1 MG tablet TAKE 1 TABLET (1 MG TOTAL) BY MOUTH DAILY. 30 tablet 11  . furosemide (LASIX) 20 MG tablet TAKE 1 TABLET (20 MG TOTAL) BY MOUTH DAILY. ONE TABLET DAILY AS NEEDED FOR SWELLING 90 tablet 1  . loratadine (CLARITIN) 10 MG tablet Take 1 tablet (10 mg total) by mouth daily.    . meloxicam (MOBIC) 7.5 MG tablet Take 7.5 mg by mouth daily.     . Multiple Vitamin (MULTIVITAMIN) capsule Take 1 capsule by mouth daily. Reported on 02/24/2016    . Omega-3 Fatty Acids (FISH OIL) 1000 MG CAPS Take 1 capsule by mouth daily. Reported on 02/24/2016    . ondansetron (ZOFRAN-ODT) 4 MG disintegrating tablet Take 1 tablet (4 mg total) by mouth every 4 (four) hours as needed for nausea or vomiting. 20 tablet 2  . senna (SENOKOT) 8.6 MG tablet Take 1 tablet by mouth daily. Take as needed around time of chemotherapy    . spironolactone (ALDACTONE) 25 MG tablet Take 1 tablet (25 mg total) by mouth daily. 90 tablet 3  . vitamin B-12 (CYANOCOBALAMIN) 1000 MCG tablet Take 1,000 mcg by mouth daily.     No current facility-administered  medications for this visit.    Facility-Administered Medications Ordered in Other Visits  Medication Dose Route Frequency Provider Last Rate Last Dose  . sodium chloride 0.9 % injection 10 mL  10 mL Intracatheter PRN [name], MD   10 mL at 05/27/15 1543  . sodium chloride 0.9 % injection 10 mL  10 mL Intracatheter PRN [name], MD   10 mL at 06/17/15 1510  . sodium chloride 0.9 % injection 10 mL  10 mL Intravenous PRN [name], MD   10 mL at 07/08/15 1340    Patient confirms/reports the following allergies:  Allergies  Allergen Reactions  . Penicillins     Reaction and severity unknown    No orders of the defined types were placed in this encounter.   AUTHORIZATION INFORMATION Primary Insurance: 1D#: Group #:  Secondary Insurance: 1D#: Group #:  SCHEDULE INFORMATION: Date: 07/22/17 Time: Location:[facility]

## 2017-07-03 ENCOUNTER — Other Ambulatory Visit: Payer: Self-pay | Admitting: Family Medicine

## 2017-07-03 MED ORDER — TADALAFIL 20 MG PO TABS: ORAL_TABLET | ORAL | 11 refills | Status: DC

## 2017-07-03 NOTE — Telephone Encounter (Signed)
[facility] sent [facility]. Left patient a message advising him RX has been sent.

## 2017-07-03 NOTE — Telephone Encounter (Signed)
Pt contacted office for refill request on the following medications:  Cialis 20MG   [facility]  [phone]

## 2017-07-03 NOTE — Telephone Encounter (Signed)
OK--1 po q 2 days prn,#6,11rf.

## 2017-07-03 NOTE — Telephone Encounter (Signed)
Please review, this is not on patient's medication list-aa

## 2017-07-08 ENCOUNTER — Ambulatory Visit
Admission: RE | Admit: 2017-07-08 | Discharge: 2017-07-08 | Disposition: A | Discharge status: Home / Self Care | Payer: BLUE CROSS/BLUE SHIELD | Source: Ambulatory Visit | Attending: Internal Medicine | Admitting: Internal Medicine

## 2017-07-08 DIAGNOSIS — I700 Atherosclerosis of aorta: Secondary | ICD-10-CM | POA: Insufficient documentation

## 2017-07-08 DIAGNOSIS — C3402 Malignant neoplasm of left main bronchus: Secondary | ICD-10-CM | POA: Insufficient documentation

## 2017-07-08 DIAGNOSIS — I712 Thoracic aortic aneurysm, without rupture: Secondary | ICD-10-CM | POA: Insufficient documentation

## 2017-07-08 DIAGNOSIS — C7989 Secondary malignant neoplasm of other specified sites: Secondary | ICD-10-CM | POA: Diagnosis not present

## 2017-07-08 MED ORDER — IOPAMIDOL (ISOVUE-300) INJECTION 61%
100.0000 mL | Freq: Once | INTRAVENOUS | Status: AC | PRN
  Administered 2017-07-08: 100 mL via INTRAVENOUS

## 2017-07-12 ENCOUNTER — Encounter: Payer: Self-pay | Admitting: *Deleted

## 2017-07-12 ENCOUNTER — Inpatient Hospital Stay: Payer: BLUE CROSS/BLUE SHIELD

## 2017-07-12 ENCOUNTER — Inpatient Hospital Stay (HOSPITAL_BASED_OUTPATIENT_CLINIC_OR_DEPARTMENT_OTHER): Payer: BLUE CROSS/BLUE SHIELD | Admitting: Internal Medicine

## 2017-07-12 ENCOUNTER — Inpatient Hospital Stay: Payer: BLUE CROSS/BLUE SHIELD | Attending: Internal Medicine

## 2017-07-12 ENCOUNTER — Ambulatory Visit: Payer: BLUE CROSS/BLUE SHIELD | Admitting: Internal Medicine

## 2017-07-12 VITALS — BP 113/77 | HR 66 | Temp 97.6°F | Wt 204.8 lb

## 2017-07-12 DIAGNOSIS — E871 Hypo-osmolality and hyponatremia: Secondary | ICD-10-CM | POA: Diagnosis not present

## 2017-07-12 DIAGNOSIS — C7802 Secondary malignant neoplasm of left lung: Secondary | ICD-10-CM | POA: Insufficient documentation

## 2017-07-12 DIAGNOSIS — R600 Localized edema: Secondary | ICD-10-CM

## 2017-07-12 DIAGNOSIS — Z006 Encounter for examination for normal comparison and control in clinical research program: Secondary | ICD-10-CM | POA: Diagnosis not present

## 2017-07-12 DIAGNOSIS — C33 Malignant neoplasm of trachea: Secondary | ICD-10-CM

## 2017-07-12 DIAGNOSIS — I712 Thoracic aortic aneurysm, without rupture: Secondary | ICD-10-CM

## 2017-07-12 DIAGNOSIS — C3402 Malignant neoplasm of left main bronchus: Secondary | ICD-10-CM

## 2017-07-12 DIAGNOSIS — Z79899 Other long term (current) drug therapy: Secondary | ICD-10-CM | POA: Diagnosis not present

## 2017-07-12 DIAGNOSIS — C3491 Malignant neoplasm of unspecified part of right bronchus or lung: Secondary | ICD-10-CM | POA: Diagnosis not present

## 2017-07-12 DIAGNOSIS — Z5111 Encounter for antineoplastic chemotherapy: Secondary | ICD-10-CM | POA: Diagnosis not present

## 2017-07-12 DIAGNOSIS — C3480 Malignant neoplasm of overlapping sites of unspecified bronchus and lung: Secondary | ICD-10-CM

## 2017-07-12 DIAGNOSIS — I38 Endocarditis, valve unspecified: Secondary | ICD-10-CM | POA: Insufficient documentation

## 2017-07-12 LAB — CBC WITH DIFFERENTIAL/PLATELET
Basophils Absolute: 0 10*3/uL (ref 0–0.1)
Basophils Relative: 1 %
EOS PCT: 3 %
Eosinophils Absolute: 0.2 10*3/uL (ref 0–0.7)
HCT: 39.1 % — ABNORMAL LOW (ref 40.0–52.0)
Hemoglobin: 13.7 g/dL (ref 13.0–18.0)
LYMPHS ABS: 1.4 10*3/uL (ref 1.0–3.6)
LYMPHS PCT: 30 %
MCH: 32.9 pg (ref 26.0–34.0)
MCHC: 35 g/dL (ref 32.0–36.0)
MCV: 94.1 fL (ref 80.0–100.0)
Monocytes Absolute: 0.4 10*3/uL (ref 0.2–1.0)
Monocytes Relative: 9 %
Neutro Abs: 2.5 10*3/uL (ref 1.4–6.5)
Neutrophils Relative %: 57 %
Platelets: 158 10*3/uL (ref 150–440)
RBC: 4.15 MIL/uL — AB (ref 4.40–5.90)
RDW: 13.2 % (ref 11.5–14.5)
WBC: 4.5 10*3/uL (ref 3.8–10.6)

## 2017-07-12 LAB — URINALYSIS, COMPLETE (UACMP) WITH MICROSCOPIC
BACTERIA UA: NONE SEEN
Bilirubin Urine: NEGATIVE
Glucose, UA: NEGATIVE mg/dL
Hgb urine dipstick: NEGATIVE
Ketones, ur: NEGATIVE mg/dL
Leukocytes, UA: NEGATIVE
Nitrite: NEGATIVE
PROTEIN: NEGATIVE mg/dL
RBC / HPF: NONE SEEN RBC/hpf (ref 0–5)
SPECIFIC GRAVITY, URINE: 1.004 — AB (ref 1.005–1.030)
Squamous Epithelial / LPF: NONE SEEN
WBC UA: NONE SEEN WBC/hpf (ref 0–5)
pH: 7 (ref 5.0–8.0)

## 2017-07-12 LAB — COMPREHENSIVE METABOLIC PANEL
ALK PHOS: 42 U/L (ref 38–126)
ALT: 18 U/L (ref 17–63)
AST: 25 U/L (ref 15–41)
Albumin: 4 g/dL (ref 3.5–5.0)
Anion gap: 7 (ref 5–15)
BUN: 25 mg/dL — AB (ref 6–20)
CHLORIDE: 97 mmol/L — AB (ref 101–111)
CO2: 29 mmol/L (ref 22–32)
CREATININE: 1.18 mg/dL (ref 0.61–1.24)
Calcium: 9.2 mg/dL (ref 8.9–10.3)
GFR calc Af Amer: >60 mL/min (ref >60)
GLUCOSE: 116 mg/dL — AB (ref 65–99)
POTASSIUM: 4.1 mmol/L (ref 3.5–5.1)
Sodium: 133 mmol/L — ABNORMAL LOW (ref 135–145)
Total Bilirubin: 0.9 mg/dL (ref 0.3–1.2)
Total Protein: 6.9 g/dL (ref 6.5–8.1)

## 2017-07-12 LAB — MAGNESIUM: Magnesium: 2 mg/dL (ref 1.7–2.4)

## 2017-07-12 MED ORDER — SODIUM CHLORIDE 0.9 % IV SOLN
Freq: Once | INTRAVENOUS | Status: AC
  Administered 2017-07-12: 12:00:00 via INTRAVENOUS
  Filled 2017-07-12: qty 4

## 2017-07-12 MED ORDER — SODIUM CHLORIDE 0.9 % IV SOLN
500.0000 mg/m2 | Freq: Once | INTRAVENOUS | Status: AC
  Administered 2017-07-12: 1050 mg via INTRAVENOUS
  Filled 2017-07-12: qty 42

## 2017-07-12 MED ORDER — HEPARIN SOD (PORK) LOCK FLUSH 100 UNIT/ML IV SOLN
500.0000 [IU] | Freq: Once | INTRAVENOUS | Status: AC | PRN
  Administered 2017-07-12: 500 [IU]

## 2017-07-12 MED ORDER — SODIUM CHLORIDE 0.9 % IV SOLN
Freq: Once | INTRAVENOUS | Status: AC
  Administered 2017-07-12: 11:00:00 via INTRAVENOUS
  Filled 2017-07-12: qty 1000

## 2017-07-12 NOTE — Progress Notes (Signed)
[place] OFFICE PROGRESS NOTE  Patient Care Team: [name], [name], stage 4 [facility])   Staging form: Lung, AJCC 7th Edition     Clinical stage from 05/24/2015: Stage IV ([place], NX, [name], MD on 05/24/2015    Oncology History   # 2012- METASTATIC ADENO CA of LEFT LUNG [acinar pattern] s/p MED LN Bx [NEG- EGFR/K-ras/ALK mutation];[name]; [name]; CT JULY 2016- NED; JAN 2018- NED  # Thoracic Aneurysm [4cm- stable]     Cancer of hilus of left lung ([place])   07/20/2016 Initial Diagnosis    Cancer of hilus of left lung ([place])       INTERVAL HISTORY:  [name] with above history of metastatic adenocarcinoma the lung currently on maintenance Alimta since June 2012 is here for follow-up prior to his chemotherapy/ Review the results of the CT scan.  He currently feels well. Patient denies any significant cough. Denies any significant shortness of breath. Appetite is good. No weight loss. Denies any headaches.  Denies any vision changes. He continues to be very active.  His low back pain status post fall has resolved.    REVIEW OF SYSTEMS:  A complete 10 point review of system is done which is negative except mentioned above/history of present illness.   PAST MEDICAL HISTORY :  Past Medical History:  Diagnosis Date  . Allergy   . Lung cancer ([place])   . Mild valvular heart disease Nov. 2014   per 2 D Echocardiogram done for persistent leg edema, monitored by cardiology  . Peripheral edema     PAST SURGICAL HISTORY :   Past Surgical History:  Procedure Laterality Date  . ELBOW SURGERY    . Left anterior thoracotomy with biopsy  March 2012  . PORTACATH PLACEMENT  March 2012  . TONSILLECTOMY      FAMILY HISTORY :   Family History   Problem Relation Age of Onset  . Leukemia Father   . Diabetes Mother     SOCIAL HISTORY:   Social History  Substance Use Topics  . Smoking status: Never Smoker  . Smokeless tobacco: Never Used  . Alcohol use 0.0 oz/week     Comment: "social drinker"     ALLERGIES:  is allergic to penicillins.  MEDICATIONS:  Current Outpatient Prescriptions  Medication Sig Dispense Refill  . folic acid (FOLVITE) 1 MG tablet TAKE 1 TABLET (1 MG TOTAL) BY MOUTH DAILY. 30 tablet 11  . furosemide (LASIX) 20 MG tablet TAKE 1 TABLET (20 MG TOTAL) BY MOUTH DAILY. ONE TABLET DAILY AS NEEDED FOR SWELLING 90 tablet 1  . loratadine (CLARITIN) 10 MG tablet Take 1 tablet (10 mg total) by mouth daily.    . meloxicam (MOBIC) 7.5 MG tablet Take 7.5 mg by mouth daily.     . Multiple Vitamin (MULTIVITAMIN) capsule Take 1 capsule by mouth daily. Reported on 02/24/2016    . Omega-3 Fatty Acids (FISH OIL) 1000 MG CAPS Take 1 capsule by mouth daily. Reported on 02/24/2016    . ondansetron (ZOFRAN-ODT) 4 MG disintegrating tablet Take 1 tablet (4 mg total) by mouth every 4 (four) hours as needed for nausea or vomiting. 20 tablet 2  . senna (SENOKOT) 8.6 MG tablet Take 1 tablet by mouth daily. Take as needed around time of  chemotherapy    . spironolactone (ALDACTONE) 25 MG tablet Take 1 tablet (25 mg total) by mouth daily. 90 tablet 3  . tadalafil (CIALIS) 20 MG tablet Take 1 tablet by mouth every 2 days as needed. 6 tablet 11  . vitamin B-12 (CYANOCOBALAMIN) 1000 MCG tablet Take 1,000 mcg by mouth daily.     No current facility-administered medications for this visit.    Facility-Administered Medications Ordered in Other Visits  Medication Dose Route Frequency Provider Last Rate Last Dose  . sodium chloride 0.9 % injection 10 mL  10 mL Intracatheter PRN [name], MD   10 mL at 05/27/15 1543  . sodium chloride 0.9 % injection 10 mL  10 mL Intracatheter PRN [name], MD   10 mL at 06/17/15 1510  . sodium  chloride 0.9 % injection 10 mL  10 mL Intravenous PRN [name], MD   10 mL at 07/08/15 1340    PHYSICAL EXAMINATION: ECOG PERFORMANCE STATUS: 0 - Asymptomatic  BP 113/77 (BP Location: Left Arm, Patient Position: Sitting)   Pulse 66   Temp 97.6 F (36.4 C) (Tympanic)   Wt 204 lb 12.8 oz (92.9 kg)   BMI 29.39 kg/m   Filed Weights   07/12/17 1028  Weight: 204 lb 12.8 oz (92.9 kg)     GENERAL: Well-nourished well-developed; Alert, [name]; good dentition  NECK: supple, [name], [name]; 1+ bilateral lower extremity swelling. ABDOMEN:abdomen soft, [name];  LABORATORY DATA:  I have reviewed the data as listed    Component Value Date/Time   NA 133 (L) 07/12/2017 1006   NA 138 12/10/2014 1341   K 4.1 07/12/2017 1006   K 3.6 12/10/2014 1341   CL 97 (L) 07/12/2017 1006   CL 102 12/10/2014 1341   CO2 29 07/12/2017 1006   CO2 30 12/10/2014 1341   GLUCOSE 116 (H) 07/12/2017 1006   GLUCOSE 160 (H) 12/10/2014 1341   BUN 25 (H) 07/12/2017 1006   BUN 21 (H) 12/10/2014 1341   CREATININE 1.18 07/12/2017 1006   CREATININE 1.10 03/25/2015 1453   CALCIUM 9.2 07/12/2017 1006   CALCIUM 8.4 (L) 12/10/2014 1341   PROT 6.9 07/12/2017 1006   PROT 6.6 12/10/2014 1341   ALBUMIN 4.0 07/12/2017 1006   ALBUMIN 3.4 12/10/2014 1341   AST 25 07/12/2017 1006   AST 17 12/10/2014 1341   ALT 18 07/12/2017 1006   ALT 23 12/10/2014 1341   ALKPHOS 42 07/12/2017 1006   ALKPHOS 50 12/10/2014 1341   BILITOT 0.9  07/12/2017 1006   BILITOT 0.5 12/10/2014 1341   GFRNONAA >60 07/12/2017 1006   GFRNONAA >60 03/25/2015 1453   GFRAA >60 07/12/2017 1006   GFRAA >60 03/25/2015 1453    No results found for: SPEP, UPEP  Lab Results  Component Value Date   WBC 4.5 07/12/2017   NEUTROABS 2.5 07/12/2017   HGB 13.7 07/12/2017   HCT 39.1 (L) 07/12/2017   MCV 94.1 07/12/2017   PLT 158 07/12/2017      Chemistry  Component Value Date/Time   NA 133 (L) 07/12/2017 1006   NA 138 12/10/2014 1341   K 4.1 07/12/2017 1006   K 3.6 12/10/2014 1341   CL 97 (L) 07/12/2017 1006   CL 102 12/10/2014 1341   CO2 29 07/12/2017 1006   CO2 30 12/10/2014 1341   BUN 25 (H) 07/12/2017 1006   BUN 21 (H) 12/10/2014 1341   CREATININE 1.18 07/12/2017 1006   CREATININE 1.10 03/25/2015 1453      Component Value Date/Time   CALCIUM 9.2 07/12/2017 1006   CALCIUM 8.4 (L) 12/10/2014 1341   ALKPHOS 42 07/12/2017 1006   ALKPHOS 50 12/10/2014 1341   AST 25 07/12/2017 1006   AST 17 12/10/2014 1341   ALT 18 07/12/2017 1006   ALT 23 12/10/2014 1341   BILITOT 0.9 07/12/2017 1006   BILITOT 0.5 12/10/2014 1341     IMPRESSION: 1. No evidence to suggest recurrent or metastatic disease in the chest or abdomen on today's examination. 2. Aortic atherosclerosis with mild aneurysmal dilatation of ascending thoracic aorta (4.5 cm in diameter). Ascending thoracic aortic aneurysm. Recommend semi-annual imaging followup by CTA or MRA and referral to cardiothoracic surgery if not already obtained. This recommendation follows 2010 ACCF/AHA/AATS/ACR/ASA/SCA/SCAI/SIR/STS/SVM Guidelines for the Diagnosis and Management of Patients With Thoracic Aortic Disease. Circulation. 2010; 121: J087-V994. Aortic Atherosclerosis (ICD10-I70.0), Aortic aneurysm NOS (ICD10-I71.9).   Electronically Signed   By: Vinnie Langton M.D.   On: 07/08/2017 08:45  RADIOGRAPHIC STUDIES: I have personally reviewed the radiological images as listed  and agreed with the findings in the report. No results found.   ASSESSMENT & PLAN:  Cancer of hilus of left lung ([place]) Stage IV NED. Patient currently on Alimta maintenance since 2012. AUG 6th  2018- CT chest abdomen pelvis NED; No clinical evidence of progression at this time.   #  Proceed with chemo/ alimta today;  Labs today reviewed;  acceptable for treatment today.   # Hyponatremia- sodium 134. improved. Monitor for now.   # lower lobe groundglass opacities- asymptomatic- resolved.   # Bil LE edema Grade 1; continue Lasix/aldactone daily.   # Follow-up with me in 3 weeks labs/chemo.   No orders of the defined types were placed in this encounter.     [name], MD 07/12/2017 1:37 PM

## 2017-07-12 NOTE — Assessment & Plan Note (Addendum)
Stage IV NED. Patient currently on Alimta maintenance since 2012. AUG 6th  2018- CT chest abdomen pelvis NED; No clinical evidence of progression at this time.   #  Proceed with chemo/ alimta today;  Labs today reviewed;  acceptable for treatment today.   # Hyponatremia- sodium 134. improved. Monitor for now.   # lower lobe groundglass opacities- asymptomatic- resolved.   # Bil LE edema Grade 1; continue Lasix/aldactone daily.   # Follow-up with me in 3 weeks labs/chemo.

## 2017-07-12 NOTE — Progress Notes (Signed)
Mitchell Mosley returns to clinic today for consideration of cycle #607 Alimta infusion on the [employer] research study. VS remain stable. His weight is also stable at 92.9kg (204lbs 12oz). The patient continues to have trace edema in his BLE and takes Lasix and Spironolactone daily for the edema.  Patient does not report any change in any of his reported AEs, but does feel his itching has improved with more frequent application of lotion. The previous pain he experienced as a result of a fall involving his coccyx has now resolved per patient report. Mitchell Mosley denies any medication changes since his last clinic visit. as needed. Patient's glucose is slightly elevated at 116 non-fasting and his sodium level remains a little low at 133 (grade 1)  CBC and other chemistries are within acceptable parameters for treatment. Patient also had a magnesium level and urinalysis today, both with normal results. Dr. Rogue Bussing reviewed results of CT scan performed on 07/08/2017, and the patient has no new disease. Will continue Alimta infusion every 3 weeks with no modifications per protocol. Adverse events with grade and attribution as follows:   Adverse Event Log Study/Protocol: [name] S130 Cycle: 606 Event Grade Onset Date Resolved Date Drug Name Pemetrexed Attribution Treatment Comments  Edema (mild) Grade 1 05/30/12 ongoing  Possible  Trace edema in ble up to calf  Fatigue Grade1 02/03/16 ongoing  Possible    Tendonitis Grade 1 09/21/16 ongoing  Unrelated None   Hyponatremia Grade 1 04/19/17 ongoing  Possible  Sodium 132  Pruritis Grade 1 04/19/17 ongoing  Possible None C/o deep itching under the skin of his palms and feet.   Pain Grade 1 06/07/17 07/12/17  Unrelated  [place] on coccyx  Yolande Jolly, BSN, MHA, OCN 07/12/2017 11:45 AM

## 2017-07-22 ENCOUNTER — Encounter: Admission: RE | Payer: Self-pay | Source: Ambulatory Visit

## 2017-07-22 ENCOUNTER — Ambulatory Visit
Admission: RE | Admit: 2017-07-22 | Payer: BLUE CROSS/BLUE SHIELD | Source: Ambulatory Visit | Admitting: Gastroenterology

## 2017-07-22 SURGERY — COLONOSCOPY WITH PROPOFOL
Anesthesia: Choice

## 2017-08-02 ENCOUNTER — Inpatient Hospital Stay: Payer: BLUE CROSS/BLUE SHIELD

## 2017-08-02 ENCOUNTER — Encounter: Payer: Self-pay | Admitting: *Deleted

## 2017-08-02 ENCOUNTER — Inpatient Hospital Stay (HOSPITAL_BASED_OUTPATIENT_CLINIC_OR_DEPARTMENT_OTHER): Payer: BLUE CROSS/BLUE SHIELD | Admitting: Internal Medicine

## 2017-08-02 VITALS — BP 117/78 | HR 82 | Resp 16 | Wt 203.6 lb

## 2017-08-02 DIAGNOSIS — C3402 Malignant neoplasm of left main bronchus: Secondary | ICD-10-CM

## 2017-08-02 DIAGNOSIS — Z79899 Other long term (current) drug therapy: Secondary | ICD-10-CM | POA: Diagnosis not present

## 2017-08-02 DIAGNOSIS — C3491 Malignant neoplasm of unspecified part of right bronchus or lung: Secondary | ICD-10-CM

## 2017-08-02 DIAGNOSIS — I38 Endocarditis, valve unspecified: Secondary | ICD-10-CM | POA: Diagnosis not present

## 2017-08-02 DIAGNOSIS — R600 Localized edema: Secondary | ICD-10-CM

## 2017-08-02 DIAGNOSIS — I712 Thoracic aortic aneurysm, without rupture: Secondary | ICD-10-CM | POA: Diagnosis not present

## 2017-08-02 DIAGNOSIS — Z5111 Encounter for antineoplastic chemotherapy: Secondary | ICD-10-CM | POA: Diagnosis not present

## 2017-08-02 DIAGNOSIS — E871 Hypo-osmolality and hyponatremia: Secondary | ICD-10-CM

## 2017-08-02 DIAGNOSIS — Z006 Encounter for examination for normal comparison and control in clinical research program: Secondary | ICD-10-CM

## 2017-08-02 DIAGNOSIS — C7802 Secondary malignant neoplasm of left lung: Secondary | ICD-10-CM

## 2017-08-02 DIAGNOSIS — C3480 Malignant neoplasm of overlapping sites of unspecified bronchus and lung: Principal | ICD-10-CM

## 2017-08-02 DIAGNOSIS — C33 Malignant neoplasm of trachea: Secondary | ICD-10-CM

## 2017-08-02 LAB — COMPREHENSIVE METABOLIC PANEL
ALK PHOS: 41 U/L (ref 38–126)
ALT: 18 U/L (ref 17–63)
ANION GAP: 8 (ref 5–15)
AST: 25 U/L (ref 15–41)
Albumin: 4.1 g/dL (ref 3.5–5.0)
BILIRUBIN TOTAL: 0.6 mg/dL (ref 0.3–1.2)
BUN: 25 mg/dL — ABNORMAL HIGH (ref 6–20)
CALCIUM: 9.1 mg/dL (ref 8.9–10.3)
CO2: 27 mmol/L (ref 22–32)
Chloride: 98 mmol/L — ABNORMAL LOW (ref 101–111)
Creatinine, Ser: 1.11 mg/dL (ref 0.61–1.24)
GFR calc Af Amer: >60 mL/min (ref >60)
GLUCOSE: 116 mg/dL — AB (ref 65–99)
Potassium: 3.8 mmol/L (ref 3.5–5.1)
Sodium: 133 mmol/L — ABNORMAL LOW (ref 135–145)
TOTAL PROTEIN: 7.2 g/dL (ref 6.5–8.1)

## 2017-08-02 LAB — CBC WITH DIFFERENTIAL/PLATELET
Basophils Absolute: 0 10*3/uL (ref 0–0.1)
Basophils Relative: 1 %
Eosinophils Absolute: 0.1 10*3/uL (ref 0–0.7)
Eosinophils Relative: 3 %
HEMATOCRIT: 39.5 % — AB (ref 40.0–52.0)
HEMOGLOBIN: 13.7 g/dL (ref 13.0–18.0)
LYMPHS ABS: 1.5 10*3/uL (ref 1.0–3.6)
Lymphocytes Relative: 30 %
MCH: 32.4 pg (ref 26.0–34.0)
MCHC: 34.8 g/dL (ref 32.0–36.0)
MCV: 93.3 fL (ref 80.0–100.0)
MONO ABS: 0.5 10*3/uL (ref 0.2–1.0)
MONOS PCT: 10 %
NEUTROS ABS: 2.8 10*3/uL (ref 1.4–6.5)
NEUTROS PCT: 56 %
Platelets: 148 10*3/uL — ABNORMAL LOW (ref 150–440)
RBC: 4.24 MIL/uL — ABNORMAL LOW (ref 4.40–5.90)
RDW: 13.3 % (ref 11.5–14.5)
WBC: 5.1 10*3/uL (ref 3.8–10.6)

## 2017-08-02 MED ORDER — SODIUM CHLORIDE 0.9 % IV SOLN
Freq: Once | INTRAVENOUS | Status: AC
  Administered 2017-08-02: 15:00:00 via INTRAVENOUS
  Filled 2017-08-02: qty 4

## 2017-08-02 MED ORDER — HEPARIN SOD (PORK) LOCK FLUSH 100 UNIT/ML IV SOLN
500.0000 [IU] | Freq: Once | INTRAVENOUS | Status: AC | PRN
  Administered 2017-08-02: 500 [IU]
  Filled 2017-08-02: qty 5

## 2017-08-02 MED ORDER — INV-PEMETREXED CHEMO INJECTION 500 MG LILLY S130
500.0000 mg/m2 | Freq: Once | INTRAVENOUS | Status: AC
  Administered 2017-08-02: 1050 mg via INTRAVENOUS
  Filled 2017-08-02: qty 42

## 2017-08-02 MED ORDER — SODIUM CHLORIDE 0.9 % IV SOLN
Freq: Once | INTRAVENOUS | Status: AC
  Administered 2017-08-02: 15:00:00 via INTRAVENOUS
  Filled 2017-08-02: qty 1000

## 2017-08-02 NOTE — Progress Notes (Signed)
[facility] OFFICE PROGRESS NOTE  Patient Care Team: [name], [name], stage 4 [facility])   Staging form: Lung, AJCC 7th Edition     Clinical stage from 05/24/2015: Stage IV ([employer], NX, [name], MD on 05/24/2015    Oncology History   # 2012- METASTATIC ADENO CA of LEFT LUNG [acinar pattern] s/p MED LN Bx [NEG- EGFR/K-ras/ALK mutation];[name]; [name]; CT JULY 2016- NED; JAN 2018- NED  # Thoracic Aneurysm [4cm- stable]     Cancer of hilus of left lung ([place])   07/20/2016 Initial Diagnosis    Cancer of hilus of left lung ([place])       INTERVAL HISTORY:  [name] with above history of metastatic adenocarcinoma the lung currently on maintenance Alimta since June 2012 is here for follow-up prior to his chemotherapy.   In the interim patient's wife had a fall; she had to undergo emergent neurosurgical procedure in [place].   He currently feels well. Patient denies any significant cough. Denies any significant shortness of breath. Appetite is good. No weight loss. Denies any headaches.  Denies any vision changes. He continues to be very active.     REVIEW OF SYSTEMS:  A complete 10 point review of system is done which is negative except mentioned above/history of present illness.   PAST MEDICAL HISTORY :  Past Medical History:  Diagnosis Date  . Allergy   . Lung cancer ([place])   . Mild valvular heart disease Nov. 2014   per 2 D Echocardiogram done for persistent leg edema, monitored by cardiology  . Peripheral edema     PAST SURGICAL HISTORY :   Past Surgical History:  Procedure Laterality Date  . ELBOW SURGERY    . Left anterior thoracotomy with biopsy  March 2012  . PORTACATH PLACEMENT  March 2012  . TONSILLECTOMY      FAMILY  HISTORY :   Family History  Problem Relation Age of Onset  . Leukemia Father   . Diabetes Mother     SOCIAL HISTORY:   Social History  Substance Use Topics  . Smoking status: Never Smoker  . Smokeless tobacco: Never Used  . Alcohol use 0.0 oz/week     Comment: "social drinker"     ALLERGIES:  is allergic to penicillins.  MEDICATIONS:  Current Outpatient Prescriptions  Medication Sig Dispense Refill  . folic acid (FOLVITE) 1 MG tablet TAKE 1 TABLET (1 MG TOTAL) BY MOUTH DAILY. 30 tablet 11  . furosemide (LASIX) 20 MG tablet TAKE 1 TABLET (20 MG TOTAL) BY MOUTH DAILY. ONE TABLET DAILY AS NEEDED FOR SWELLING 90 tablet 1  . loratadine (CLARITIN) 10 MG tablet Take 1 tablet (10 mg total) by mouth daily.    . meloxicam (MOBIC) 7.5 MG tablet Take 7.5 mg by mouth daily.     . Multiple Vitamin (MULTIVITAMIN) capsule Take 1 capsule by mouth daily. Reported on 02/24/2016    . Omega-3 Fatty Acids (FISH OIL) 1000 MG CAPS Take 1 capsule by mouth daily. Reported on 02/24/2016    . ondansetron (ZOFRAN-ODT) 4 MG disintegrating tablet Take 1 tablet (4 mg total) by mouth every 4 (four) hours as needed for nausea or vomiting. 20 tablet 2  . senna (SENOKOT) 8.6 MG tablet Take 1 tablet by mouth daily. Take as  needed around time of chemotherapy    . spironolactone (ALDACTONE) 25 MG tablet Take 1 tablet (25 mg total) by mouth daily. 90 tablet 3  . tadalafil (CIALIS) 20 MG tablet Take 1 tablet by mouth every 2 days as needed. 6 tablet 11  . vitamin B-12 (CYANOCOBALAMIN) 1000 MCG tablet Take 1,000 mcg by mouth daily.     No current facility-administered medications for this visit.    Facility-Administered Medications Ordered in Other Visits  Medication Dose Route Frequency Provider Last Rate Last Dose  . sodium chloride 0.9 % injection 10 mL  10 mL Intracatheter PRN [name], MD   10 mL at 05/27/15 1543  . sodium chloride 0.9 % injection 10 mL  10 mL Intracatheter PRN [name], MD   10 mL at  06/17/15 1510  . sodium chloride 0.9 % injection 10 mL  10 mL Intravenous PRN [name], MD   10 mL at 07/08/15 1340    PHYSICAL EXAMINATION: ECOG PERFORMANCE STATUS: 0 - Asymptomatic  BP 117/78 (BP Location: Left Arm, Patient Position: Sitting)   Pulse 82   Resp 16   Wt 203 lb 9.6 oz (92.4 kg)   BMI 29.21 kg/m   Filed Weights   08/02/17 1406  Weight: 203 lb 9.6 oz (92.4 kg)     GENERAL: Well-nourished well-developed; Alert, [name]; good dentition  NECK: supple, [name], [name]; 1+ bilateral lower extremity swelling. ABDOMEN:abdomen soft, [name];  LABORATORY DATA:  I have reviewed the data as listed    Component Value Date/Time   NA 133 (L) 08/02/2017 1135   NA 138 12/10/2014 1341   K 3.8 08/02/2017 1135   K 3.6 12/10/2014 1341   CL 98 (L) 08/02/2017 1135   CL 102 12/10/2014 1341   CO2 27 08/02/2017 1135   CO2 30 12/10/2014 1341   GLUCOSE 116 (H) 08/02/2017 1135   GLUCOSE 160 (H) 12/10/2014 1341   BUN 25 (H) 08/02/2017 1135   BUN 21 (H) 12/10/2014 1341   CREATININE 1.11 08/02/2017 1135   CREATININE 1.10 03/25/2015 1453   CALCIUM 9.1 08/02/2017 1135   CALCIUM 8.4 (L) 12/10/2014 1341   PROT 7.2 08/02/2017 1135   PROT 6.6 12/10/2014 1341   ALBUMIN 4.1 08/02/2017 1135   ALBUMIN 3.4 12/10/2014 1341   AST 25 08/02/2017 1135   AST 17 12/10/2014 1341   ALT 18 08/02/2017 1135   ALT 23 12/10/2014 1341   ALKPHOS 41 08/02/2017 1135   ALKPHOS 50 12/10/2014 1341   BILITOT 0.6  08/02/2017 1135   BILITOT 0.5 12/10/2014 1341   GFRNONAA >60 08/02/2017 1135   GFRNONAA >60 03/25/2015 1453   GFRAA >60 08/02/2017 1135   GFRAA >60 03/25/2015 1453    No results found for: SPEP, UPEP  Lab Results  Component Value Date   WBC 5.1 08/02/2017   NEUTROABS 2.8 08/02/2017   HGB 13.7 08/02/2017   HCT 39.5 (L) 08/02/2017   MCV 93.3 08/02/2017   PLT 148 (L) 08/02/2017      Chemistry  Component Value Date/Time   NA 133 (L) 08/02/2017 1135   NA 138 12/10/2014 1341   K 3.8 08/02/2017 1135   K 3.6 12/10/2014 1341   CL 98 (L) 08/02/2017 1135   CL 102 12/10/2014 1341   CO2 27 08/02/2017 1135   CO2 30 12/10/2014 1341   BUN 25 (H) 08/02/2017 1135   BUN 21 (H) 12/10/2014 1341   CREATININE 1.11 08/02/2017 1135   CREATININE 1.10 03/25/2015 1453      Component Value Date/Time   CALCIUM 9.1 08/02/2017 1135   CALCIUM 8.4 (L) 12/10/2014 1341   ALKPHOS 41 08/02/2017 1135   ALKPHOS 50 12/10/2014 1341   AST 25 08/02/2017 1135   AST 17 12/10/2014 1341   ALT 18 08/02/2017 1135   ALT 23 12/10/2014 1341   BILITOT 0.6 08/02/2017 1135   BILITOT 0.5 12/10/2014 1341     IMPRESSION: 1. No evidence to suggest recurrent or metastatic disease in the chest or abdomen on today's examination. 2. Aortic atherosclerosis with mild aneurysmal dilatation of ascending thoracic aorta (4.5 cm in diameter). Ascending thoracic aortic aneurysm. Recommend semi-annual imaging followup by CTA or MRA and referral to cardiothoracic surgery if not already obtained. This recommendation follows 2010 ACCF/AHA/AATS/ACR/ASA/SCA/SCAI/SIR/STS/SVM Guidelines for the Diagnosis and Management of Patients With Thoracic Aortic Disease. Circulation. 2010; 121: R945-O592. Aortic Atherosclerosis (ICD10-I70.0), Aortic aneurysm NOS (ICD10-I71.9).   Electronically Signed   By: Vinnie Langton M.D.   On: 07/08/2017 08:45  RADIOGRAPHIC STUDIES: I have personally reviewed the radiological images as  listed and agreed with the findings in the report. No results found.   ASSESSMENT & PLAN:  Cancer of hilus of left lung ([place]) Stage IV NED. Patient currently on Alimta maintenance since 2012. AUG 6th  2018- CT chest abdomen pelvis NED; No clinical evidence of progression at this time.   #  Proceed with chemo/ alimta today;  Labs today reviewed;  acceptable for treatment today.   # Hyponatremia- sodium 134. improved. Monitor for now.   # Bil LE edema Grade 1; continue Lasix/aldactone daily.   # Follow-up with me in 3 weeks labs/chemo.   No orders of the defined types were placed in this encounter.     Mitchell Sickle, MD 08/04/2017 6:30 AM

## 2017-08-02 NOTE — Progress Notes (Signed)
Mr. Dieujuste returns to clinic today for consideration of cycle #608 Alimta infusion on the [employer] research study. VS remain stable. His weight is also stable at 92.5kg (203lbs 9oz). The patient continues to have trace edema in his BLE and takes Lasix and Spironolactone daily for the edema.  Patient does not report any change in any of his reported AEs and has no new ones to report.  Mr. Whiteley denies any medication changes since his last clinic visit. Patient's glucose is slightly elevated at 116 non-fasting and his sodium level remains a little low at 133 (grade 1)  CBC and other chemistries are within acceptable parameters for treatment. Will continue Alimta infusion every 3 weeks with no modifications per protocol at present.  Patient did report that his wife had a subdural hematoma on 07/14/17 while [place] and had to have emergency craniotomy.  She is home and doing well as of today. Adverse events with grade and attribution as follows:   Adverse Event Log Study/Protocol: [name] S130 Cycle: 607 Event Grade Onset Date Resolved Date Drug Name Pemetrexed Attribution Treatment Comments  Edema (mild) Grade 1 05/30/12 ongoing  Possible  Trace edema in ble up to calf  Fatigue Grade1 02/03/16 ongoing  Possible    Tendonitis Grade 1 09/21/16 ongoing  Unrelated None   Hyponatremia Grade 1 04/19/17 ongoing  Possible  Sodium 133  Pruritis Grade 1 04/19/17 ongoing  Possible None C/o deep itching under the skin of his palms and feet.   Pain Grade 1 06/07/17 07/12/17  Unrelated  [place] on coccyx  Raynelle Dick, RN BSN "08/02/2017 3:04 PM"

## 2017-08-02 NOTE — Assessment & Plan Note (Addendum)
Stage IV NED. Patient currently on Alimta maintenance since 2012. AUG 6th  2018- CT chest abdomen pelvis NED; No clinical evidence of progression at this time.   #  Proceed with chemo/ alimta today;  Labs today reviewed;  acceptable for treatment today.   # Hyponatremia- sodium 134. improved. Monitor for now.   # Bil LE edema Grade 1; continue Lasix/aldactone daily.   # Follow-up with me in 3 weeks labs/chemo.

## 2017-08-06 ENCOUNTER — Other Ambulatory Visit: Payer: Self-pay | Admitting: Internal Medicine

## 2017-08-23 ENCOUNTER — Encounter: Payer: Self-pay | Admitting: *Deleted

## 2017-08-23 ENCOUNTER — Inpatient Hospital Stay (HOSPITAL_BASED_OUTPATIENT_CLINIC_OR_DEPARTMENT_OTHER): Payer: BLUE CROSS/BLUE SHIELD | Admitting: Internal Medicine

## 2017-08-23 ENCOUNTER — Inpatient Hospital Stay: Payer: BLUE CROSS/BLUE SHIELD

## 2017-08-23 ENCOUNTER — Inpatient Hospital Stay: Payer: BLUE CROSS/BLUE SHIELD | Attending: Internal Medicine

## 2017-08-23 VITALS — BP 123/77 | HR 78 | Temp 97.4°F | Resp 16 | Wt 206.6 lb

## 2017-08-23 DIAGNOSIS — R600 Localized edema: Secondary | ICD-10-CM | POA: Diagnosis not present

## 2017-08-23 DIAGNOSIS — C7802 Secondary malignant neoplasm of left lung: Secondary | ICD-10-CM | POA: Diagnosis not present

## 2017-08-23 DIAGNOSIS — I700 Atherosclerosis of aorta: Secondary | ICD-10-CM | POA: Insufficient documentation

## 2017-08-23 DIAGNOSIS — C3402 Malignant neoplasm of left main bronchus: Secondary | ICD-10-CM

## 2017-08-23 DIAGNOSIS — Z5111 Encounter for antineoplastic chemotherapy: Secondary | ICD-10-CM | POA: Insufficient documentation

## 2017-08-23 DIAGNOSIS — I38 Endocarditis, valve unspecified: Secondary | ICD-10-CM | POA: Diagnosis not present

## 2017-08-23 DIAGNOSIS — Z791 Long term (current) use of non-steroidal anti-inflammatories (NSAID): Secondary | ICD-10-CM | POA: Insufficient documentation

## 2017-08-23 DIAGNOSIS — C3491 Malignant neoplasm of unspecified part of right bronchus or lung: Secondary | ICD-10-CM | POA: Diagnosis not present

## 2017-08-23 DIAGNOSIS — Z79899 Other long term (current) drug therapy: Secondary | ICD-10-CM

## 2017-08-23 DIAGNOSIS — C3480 Malignant neoplasm of overlapping sites of unspecified bronchus and lung: Principal | ICD-10-CM

## 2017-08-23 DIAGNOSIS — Z006 Encounter for examination for normal comparison and control in clinical research program: Secondary | ICD-10-CM | POA: Diagnosis not present

## 2017-08-23 DIAGNOSIS — C33 Malignant neoplasm of trachea: Secondary | ICD-10-CM

## 2017-08-23 LAB — CBC WITH DIFFERENTIAL/PLATELET
BASOS ABS: 0 10*3/uL (ref 0–0.1)
BASOS PCT: 0 %
Eosinophils Absolute: 0.1 10*3/uL (ref 0–0.7)
Eosinophils Relative: 3 %
HEMATOCRIT: 39.6 % — AB (ref 40.0–52.0)
HEMOGLOBIN: 13.8 g/dL (ref 13.0–18.0)
Lymphocytes Relative: 33 %
Lymphs Abs: 1.4 10*3/uL (ref 1.0–3.6)
MCH: 32.2 pg (ref 26.0–34.0)
MCHC: 35 g/dL (ref 32.0–36.0)
MCV: 92 fL (ref 80.0–100.0)
MONOS PCT: 11 %
Monocytes Absolute: 0.5 10*3/uL (ref 0.2–1.0)
NEUTROS PCT: 53 %
Neutro Abs: 2.3 10*3/uL (ref 1.4–6.5)
Platelets: 158 10*3/uL (ref 150–440)
RBC: 4.3 MIL/uL — AB (ref 4.40–5.90)
RDW: 13 % (ref 11.5–14.5)
WBC: 4.3 10*3/uL (ref 3.8–10.6)

## 2017-08-23 LAB — COMPREHENSIVE METABOLIC PANEL
ALBUMIN: 4.1 g/dL (ref 3.5–5.0)
ALK PHOS: 39 U/L (ref 38–126)
ALT: 19 U/L (ref 17–63)
AST: 28 U/L (ref 15–41)
Anion gap: 8 (ref 5–15)
BUN: 21 mg/dL — ABNORMAL HIGH (ref 6–20)
CALCIUM: 9.1 mg/dL (ref 8.9–10.3)
CHLORIDE: 98 mmol/L — AB (ref 101–111)
CO2: 26 mmol/L (ref 22–32)
CREATININE: 1.08 mg/dL (ref 0.61–1.24)
GFR calc Af Amer: >60 mL/min (ref >60)
GFR calc non Af Amer: >60 mL/min (ref >60)
GLUCOSE: 105 mg/dL — AB (ref 65–99)
Potassium: 3.7 mmol/L (ref 3.5–5.1)
Sodium: 132 mmol/L — ABNORMAL LOW (ref 135–145)
Total Bilirubin: 1 mg/dL (ref 0.3–1.2)
Total Protein: 7.2 g/dL (ref 6.5–8.1)

## 2017-08-23 MED ORDER — SODIUM CHLORIDE 0.9 % IJ SOLN
10.0000 mL | INTRAMUSCULAR | Status: DC | PRN
  Administered 2017-08-23: 10 mL
  Filled 2017-08-23: qty 10

## 2017-08-23 MED ORDER — SODIUM CHLORIDE 0.9 % IV SOLN
Freq: Once | INTRAVENOUS | Status: AC
  Administered 2017-08-23: 15:00:00 via INTRAVENOUS
  Filled 2017-08-23: qty 1000

## 2017-08-23 MED ORDER — HEPARIN SOD (PORK) LOCK FLUSH 100 UNIT/ML IV SOLN
500.0000 [IU] | Freq: Once | INTRAVENOUS | Status: AC | PRN
  Administered 2017-08-23: 500 [IU]
  Filled 2017-08-23: qty 5

## 2017-08-23 MED ORDER — SODIUM CHLORIDE 0.9 % IV SOLN
Freq: Once | INTRAVENOUS | Status: AC
  Administered 2017-08-23: 15:00:00 via INTRAVENOUS
  Filled 2017-08-23: qty 4

## 2017-08-23 MED ORDER — CYANOCOBALAMIN 1000 MCG/ML IJ SOLN
1000.0000 ug | Freq: Once | INTRAMUSCULAR | Status: AC
  Administered 2017-08-23: 1000 ug via INTRAMUSCULAR
  Filled 2017-08-23: qty 1

## 2017-08-23 MED ORDER — SODIUM CHLORIDE 0.9 % IV SOLN
500.0000 mg/m2 | Freq: Once | INTRAVENOUS | Status: AC
  Administered 2017-08-23: 1050 mg via INTRAVENOUS
  Filled 2017-08-23: qty 42

## 2017-08-23 NOTE — Progress Notes (Signed)
[facility] returns to clinic today for consideration of cycle #609 Alimta infusion on the [employer] research study. VS remain stable. His weight is also stable at 92.5kg (203lbs 9oz). The patient continues to have trace edema in his BLE and takes Lasix and Spironolactone daily for the edema. He also confirms that he is taking Folic Acid and Mobic daily, and other medications on his med list are taken as needed only. [facility] denies any medication changes since his last clinic visit. Patient does not report any change in any of his reported AEs and has no new ones to report.   Patient's glucose is slightly elevated at 105 non-fasting and his sodium level remains a little low at 132 (grade 1)  CBC and other chemistries are within acceptable parameters for treatment. Will continue Alimta infusion every 3 weeks with no modifications per protocol at present, and patient will receive a vitamin B12 injection today.  Patient previously reported that his wife had a subdural hematoma on 07/14/17 while [place] were [place] and had to have emergency craniotomy. Reports she is still doing well and is at home. States she is now seeing a Herbalist. Current adverse events with grade and attribution as follows:   Adverse Event Log Study/Protocol: [facility] S130 Cycle: 608 Event Grade Onset Date Resolved Date Drug Name Pemetrexed Attribution Treatment Comments  Edema (mild) Grade 1 05/30/12 ongoing  Possible  Trace edema in ble up to calf  Fatigue Grade1 02/03/16 ongoing  Possible    Tendonitis Grade 1 09/21/16 ongoing  Unrelated None   Hyponatremia Grade 1 04/19/17 ongoing  Possible  Sodium 133  Pruritis Grade 1 04/19/17 ongoing  Possible None C/o deep itching under the skin of his palms and feet.   Yolande Jolly, BSN, MHA, OCN 08/23/2017 2:34 PM

## 2017-08-23 NOTE — Progress Notes (Signed)
[place] OFFICE PROGRESS NOTE  Patient Care Team: [name], [name], stage 4 [facility])   Staging form: Lung, AJCC 7th Edition     Clinical stage from 05/24/2015: Stage IV ([place], NX, [name], MD on 05/24/2015    Oncology History   # 2012- METASTATIC ADENO CA of LEFT LUNG [acinar pattern] s/p MED LN Bx [NEG- EGFR/K-ras/ALK mutation];[name]; [name]; CT JULY 2016- NED; JAN 2018- NED  # Thoracic Aneurysm [4cm- stable]     Cancer of hilus of left lung ([place])   07/20/2016 Initial Diagnosis    Cancer of hilus of left lung ([place])       INTERVAL HISTORY:  [name] with above history of metastatic adenocarcinoma the lung currently on maintenance Alimta since June 2012 is here for follow-up prior to his chemotherapy.   In the interim patient's wife had a fall; she had to undergo emergent neurosurgical procedure in [place]. Patient's wife is back home.  Denies any significant shortness of breath. Appetite is good. No weight loss.  He denies any fatigue. He currently feels well. Patient denies any significant cough. Denies any headaches.  Denies any vision changes.   REVIEW OF SYSTEMS:  A complete 10 point review of system is done which is negative except mentioned above/history of present illness.   PAST MEDICAL HISTORY :  Past Medical History:  Diagnosis Date  . Allergy   . Lung cancer ([place])   . Mild valvular heart disease Nov. 2014   per 2 D Echocardiogram done for persistent leg edema, monitored by cardiology  . Peripheral edema     PAST SURGICAL HISTORY :   Past Surgical History:  Procedure Laterality Date  . ELBOW SURGERY    . Left anterior thoracotomy with biopsy  March 2012  . PORTACATH PLACEMENT  March 2012  .  TONSILLECTOMY      FAMILY HISTORY :   Family History  Problem Relation Age of Onset  . Leukemia Father   . Diabetes Mother     SOCIAL HISTORY:   Social History  Substance Use Topics  . Smoking status: Never Smoker  . Smokeless tobacco: Never Used  . Alcohol use 0.0 oz/week     Comment: "social drinker"     ALLERGIES:  is allergic to penicillins.  MEDICATIONS:  Current Outpatient Prescriptions  Medication Sig Dispense Refill  . folic acid (FOLVITE) 1 MG tablet TAKE 1 TABLET (1 MG TOTAL) BY MOUTH DAILY. 30 tablet 11  . furosemide (LASIX) 20 MG tablet TAKE 1 TABLET (20 MG TOTAL) BY MOUTH DAILY. ONE TABLET DAILY AS NEEDED FOR SWELLING 90 tablet 1  . loratadine (CLARITIN) 10 MG tablet Take 1 tablet (10 mg total) by mouth daily.    . meloxicam (MOBIC) 7.5 MG tablet Take 7.5 mg by mouth daily.     . Multiple Vitamin (MULTIVITAMIN) capsule Take 1 capsule by mouth daily. Reported on 02/24/2016    . Omega-3 Fatty Acids (FISH OIL) 1000 MG CAPS Take 1 capsule by mouth daily. Reported on 02/24/2016    . senna (SENOKOT) 8.6 MG tablet Take 1 tablet by mouth daily. Take as needed around time of chemotherapy    . spironolactone (ALDACTONE) 25 MG tablet Take 1 tablet (25 mg total) by mouth daily. 90 tablet 3  .  tadalafil (CIALIS) 20 MG tablet Take 1 tablet by mouth every 2 days as needed. 6 tablet 11  . vitamin B-12 (CYANOCOBALAMIN) 1000 MCG tablet Take 1,000 mcg by mouth daily.    . ondansetron (ZOFRAN-ODT) 4 MG disintegrating tablet Take 1 tablet (4 mg total) by mouth every 4 (four) hours as needed for nausea or vomiting. (Patient not taking: Reported on 08/23/2017) 20 tablet 2   No current facility-administered medications for this visit.    Facility-Administered Medications Ordered in Other Visits  Medication Dose Route Frequency Provider Last Rate Last Dose  . sodium chloride 0.9 % injection 10 mL  10 mL Intracatheter PRN [name], MD   10 mL at 05/27/15 1543  . sodium chloride 0.9 %  injection 10 mL  10 mL Intracatheter PRN [name], MD   10 mL at 06/17/15 1510  . sodium chloride 0.9 % injection 10 mL  10 mL Intravenous PRN [name], MD   10 mL at 07/08/15 1340    PHYSICAL EXAMINATION: ECOG PERFORMANCE STATUS: 0 - Asymptomatic  BP 123/77 (BP Location: Left Arm, Patient Position: Sitting)   Pulse 78   Temp (!) 97.4 F (36.3 C) (Tympanic)   Resp 16   Wt 206 lb 9.6 oz (93.7 kg)   BMI 29.64 kg/m   Filed Weights   08/23/17 1409  Weight: 206 lb 9.6 oz (93.7 kg)     GENERAL: Well-nourished well-developed; Alert, [name]; good dentition  NECK: supple, [name], [name]; 1+ bilateral lower extremity swelling. ABDOMEN:abdomen soft, [name];  LABORATORY DATA:  I have reviewed the data as listed    Component Value Date/Time   NA 132 (L) 08/23/2017 1155   NA 138 12/10/2014 1341   K 3.7 08/23/2017 1155   K 3.6 12/10/2014 1341   CL 98 (L) 08/23/2017 1155   CL 102 12/10/2014 1341   CO2 26 08/23/2017 1155   CO2 30 12/10/2014 1341   GLUCOSE 105 (H) 08/23/2017 1155   GLUCOSE 160 (H) 12/10/2014 1341   BUN 21 (H) 08/23/2017 1155   BUN 21 (H) 12/10/2014 1341   CREATININE 1.08 08/23/2017 1155   CREATININE 1.10 03/25/2015 1453   CALCIUM 9.1 08/23/2017 1155   CALCIUM 8.4 (L) 12/10/2014 1341   PROT 7.2 08/23/2017 1155   PROT 6.6 12/10/2014 1341   ALBUMIN 4.1 08/23/2017 1155   ALBUMIN 3.4 12/10/2014 1341   AST 28 08/23/2017 1155   AST 17 12/10/2014 1341   ALT 19  08/23/2017 1155   ALT 23 12/10/2014 1341   ALKPHOS 39 08/23/2017 1155   ALKPHOS 50 12/10/2014 1341   BILITOT 1.0 08/23/2017 1155   BILITOT 0.5 12/10/2014 1341   GFRNONAA >60 08/23/2017 1155   GFRNONAA >60 03/25/2015 1453   GFRAA >60 08/23/2017 1155   GFRAA >60 03/25/2015 1453    No results found for: SPEP, UPEP  Lab Results  Component Value Date   WBC 4.3 08/23/2017   NEUTROABS 2.3 08/23/2017   HGB 13.8 08/23/2017   HCT 39.6 (L) 08/23/2017   MCV 92.0  08/23/2017   PLT 158 08/23/2017      Chemistry      Component Value Date/Time   NA 132 (L) 08/23/2017 1155   NA 138 12/10/2014 1341   K 3.7 08/23/2017 1155   K 3.6 12/10/2014 1341   CL 98 (L) 08/23/2017 1155   CL 102 12/10/2014 1341   CO2 26 08/23/2017 1155   CO2 30 12/10/2014 1341   BUN 21 (H) 08/23/2017 1155   BUN 21 (H) 12/10/2014 1341   CREATININE 1.08 08/23/2017 1155   CREATININE 1.10 03/25/2015 1453      Component Value Date/Time   CALCIUM 9.1 08/23/2017 1155   CALCIUM 8.4 (L) 12/10/2014 1341   ALKPHOS 39 08/23/2017 1155   ALKPHOS 50 12/10/2014 1341   AST 28 08/23/2017 1155   AST 17 12/10/2014 1341   ALT 19 08/23/2017 1155   ALT 23 12/10/2014 1341   BILITOT 1.0 08/23/2017 1155   BILITOT 0.5 12/10/2014 1341     IMPRESSION: 1. No evidence to suggest recurrent or metastatic disease in the chest or abdomen on today's examination. 2. Aortic atherosclerosis with mild aneurysmal dilatation of ascending thoracic aorta (4.5 cm in diameter). Ascending thoracic aortic aneurysm. Recommend semi-annual imaging followup by CTA or MRA and referral to cardiothoracic surgery if not already obtained. This recommendation follows 2010 ACCF/AHA/AATS/ACR/ASA/SCA/SCAI/SIR/STS/SVM Guidelines for the Diagnosis and Management of Patients With Thoracic Aortic Disease. Circulation. 2010; 121: K876-O115. Aortic Atherosclerosis (ICD10-I70.0), Aortic aneurysm NOS (ICD10-I71.9).   Electronically Signed   By: Vinnie Langton  M.D.   On: 07/08/2017 08:45  RADIOGRAPHIC STUDIES: I have personally reviewed the radiological images as listed and agreed with the findings in the report. No results found.   ASSESSMENT & PLAN:  Cancer of hilus of left lung ([place]) Stage IV NED. Patient currently on Alimta maintenance since 2012. AUG 6th  2018- CT chest abdomen pelvis NED; No clinical evidence of progression at this time.   #  Proceed with chemo/ alimta today;  Labs today reviewed;  acceptable for treatment today.   # Hyponatremia- sodium 132. improved. Monitor for now.   # Bil LE edema Grade 1; continue Lasix/aldactone daily.   # Follow-up with me in 3 weeks labs/chemo. Discussed with the clinical trials nurses.   No orders of the defined types were placed in this encounter.     [name], MD 08/29/2017 8:43 PM

## 2017-08-23 NOTE — Assessment & Plan Note (Addendum)
Stage IV NED. Patient currently on Alimta maintenance since 2012. AUG 6th  2018- CT chest abdomen pelvis NED; No clinical evidence of progression at this time.   #  Proceed with chemo/ alimta today;  Labs today reviewed;  acceptable for treatment today.   # Hyponatremia- sodium 132. improved. Monitor for now.   # Bil LE edema Grade 1; continue Lasix/aldactone daily.   # Follow-up with me in 3 weeks labs/chemo. Discussed with the clinical trials nurses.

## 2017-09-03 ENCOUNTER — Telehealth: Payer: Self-pay | Admitting: *Deleted

## 2017-09-03 ENCOUNTER — Telehealth: Payer: Self-pay

## 2017-09-03 NOTE — Telephone Encounter (Signed)
Call health department?-Anastasiya Estell Harpin, RMA

## 2017-09-03 NOTE — Telephone Encounter (Signed)
Received t/c from Mitchell Mosley reporting that his mother has passed away and he will be traveling to [place] for her funeral. He is concerned that he may need some vaccinations before he leaves and called to inquire about this. Informed Dr. Rogue Bussing who states that from an oncology/chemotherapy perspective, there is nothing he needs; however he states [name] should also consult with his PCP to see if there is anything from their perspective that he should receive. Informed patient of this and he states he already has a call in to Dr. Rosanna Randy to see if he recommends any type of vaccine prior to patient departure in the next couple of days. Patient states he will return prior to his next scheduled Alimta infusion; which is on 09/16/17. Yolande Jolly, BSN, MHA, OCN 09/03/2017 3:42 PM

## 2017-09-03 NOTE — Telephone Encounter (Signed)
Patient called office because he will be traveling to [place] in the next 7 days and wanted to know what vaccines he should have prior to trip? KW

## 2017-09-04 NOTE — Telephone Encounter (Signed)
For this trip he needs to call one of the travel clinic's. I think closest ones are in [place] in [place]. Maybe in [place]. Please make sure he is up-to-date on routine vaccines. Otherwise he needs to check with the travel clinic for specific vaccines for trip to [place].

## 2017-09-04 NOTE — Telephone Encounter (Signed)
Pt advised.

## 2017-09-06 ENCOUNTER — Telehealth: Payer: Self-pay | Admitting: *Deleted

## 2017-09-06 ENCOUNTER — Other Ambulatory Visit: Payer: Self-pay

## 2017-09-06 DIAGNOSIS — R609 Edema, unspecified: Secondary | ICD-10-CM

## 2017-09-06 DIAGNOSIS — C3480 Malignant neoplasm of overlapping sites of unspecified bronchus and lung: Principal | ICD-10-CM

## 2017-09-06 DIAGNOSIS — C33 Malignant neoplasm of trachea: Secondary | ICD-10-CM

## 2017-09-06 MED ORDER — SPIRONOLACTONE 25 MG PO TABS: 25.0000 mg | ORAL_TABLET | Freq: Every day | ORAL | 3 refills | Status: DC

## 2017-09-06 NOTE — Telephone Encounter (Signed)
Received t/c from patient this morning stating that he is out of spironolactone. He is leaving in the morning to go to [place] for his Mother's funeral and will be out of the country for about 12 days. Informed Dr. Aletha Halim team that patient needs this medication refilled today and they states they will get refills authorized. Will also reschedule patient's infusion appointment - delaying it one week to accommodate patient's travel. Yolande Jolly, BSN, MHA, OCN 09/06/2017 9:37 AM

## 2017-09-09 ENCOUNTER — Other Ambulatory Visit: Payer: Self-pay | Admitting: Internal Medicine

## 2017-09-09 DIAGNOSIS — C3480 Malignant neoplasm of overlapping sites of unspecified bronchus and lung: Principal | ICD-10-CM

## 2017-09-09 DIAGNOSIS — R609 Edema, unspecified: Secondary | ICD-10-CM

## 2017-09-09 DIAGNOSIS — C33 Malignant neoplasm of trachea: Secondary | ICD-10-CM

## 2017-09-12 ENCOUNTER — Telehealth: Payer: Self-pay | Admitting: *Deleted

## 2017-09-12 NOTE — Telephone Encounter (Signed)
Received t/c from [facility]. Patient is currently in [place] and reports his return flight will be delayed and he will not be able to make his appointment that was scheduled on 09/20/17. Patient requests to just skip the current treatment and resume his Alimta on 10/06/17 - which would have been his next scheduled infusion. Will inform Dr. Rogue Bussing and study monitor of this. Yolande Jolly, BSN, MHA, OCN 09/12/2017 3:05 PM

## 2017-09-16 ENCOUNTER — Other Ambulatory Visit: Payer: BLUE CROSS/BLUE SHIELD

## 2017-09-16 ENCOUNTER — Ambulatory Visit: Payer: BLUE CROSS/BLUE SHIELD

## 2017-09-16 ENCOUNTER — Ambulatory Visit: Payer: BLUE CROSS/BLUE SHIELD | Admitting: Internal Medicine

## 2017-09-20 ENCOUNTER — Ambulatory Visit: Payer: BLUE CROSS/BLUE SHIELD

## 2017-09-20 ENCOUNTER — Other Ambulatory Visit: Payer: BLUE CROSS/BLUE SHIELD

## 2017-09-20 ENCOUNTER — Ambulatory Visit: Payer: BLUE CROSS/BLUE SHIELD | Admitting: Oncology

## 2017-10-01 ENCOUNTER — Telehealth: Payer: Self-pay | Admitting: *Deleted

## 2017-10-01 NOTE — Telephone Encounter (Signed)
Received t/c today from patient questioning whether his appointment had been rescheduled for Monday. Patient has a tennis tournament this weekend and does not want to receive his chemotherapy prior to that time. He had previously requested to have his Alimta on 09/27/17; however the study monitor did not respond to this request giving permission to do so. Instructed patient that I will get the appointment changed and call him back with a time. Yolande Jolly, BSN, MHA, OCN 10/01/2017 1:26 PM

## 2017-10-02 NOTE — Telephone Encounter (Signed)
T/C made to patient to inform of his new appointment time on Monday - 10/07/17 at 1:30 in the afternoon. He plans to come late in the morning to have labs collected. Yolande Jolly, BSN, MHA, OCN 10/02/2017 4:15 PM

## 2017-10-04 ENCOUNTER — Inpatient Hospital Stay: Payer: BLUE CROSS/BLUE SHIELD

## 2017-10-04 ENCOUNTER — Inpatient Hospital Stay: Payer: BLUE CROSS/BLUE SHIELD | Admitting: Internal Medicine

## 2017-10-04 ENCOUNTER — Other Ambulatory Visit: Payer: Self-pay | Admitting: Internal Medicine

## 2017-10-07 ENCOUNTER — Inpatient Hospital Stay: Payer: BLUE CROSS/BLUE SHIELD

## 2017-10-07 ENCOUNTER — Encounter: Payer: Self-pay | Admitting: Internal Medicine

## 2017-10-07 ENCOUNTER — Inpatient Hospital Stay: Payer: BLUE CROSS/BLUE SHIELD | Attending: Internal Medicine | Admitting: Internal Medicine

## 2017-10-07 ENCOUNTER — Encounter: Payer: Self-pay | Admitting: *Deleted

## 2017-10-07 VITALS — BP 139/90 | HR 70 | Temp 97.8°F | Resp 18 | Ht 70.0 in | Wt 207.0 lb

## 2017-10-07 DIAGNOSIS — Z79899 Other long term (current) drug therapy: Secondary | ICD-10-CM | POA: Diagnosis not present

## 2017-10-07 DIAGNOSIS — Z23 Encounter for immunization: Secondary | ICD-10-CM

## 2017-10-07 DIAGNOSIS — C3491 Malignant neoplasm of unspecified part of right bronchus or lung: Secondary | ICD-10-CM | POA: Insufficient documentation

## 2017-10-07 DIAGNOSIS — Z5111 Encounter for antineoplastic chemotherapy: Secondary | ICD-10-CM | POA: Diagnosis not present

## 2017-10-07 DIAGNOSIS — I38 Endocarditis, valve unspecified: Secondary | ICD-10-CM | POA: Diagnosis not present

## 2017-10-07 DIAGNOSIS — C7802 Secondary malignant neoplasm of left lung: Secondary | ICD-10-CM | POA: Insufficient documentation

## 2017-10-07 DIAGNOSIS — I712 Thoracic aortic aneurysm, without rupture: Secondary | ICD-10-CM | POA: Insufficient documentation

## 2017-10-07 DIAGNOSIS — Z006 Encounter for examination for normal comparison and control in clinical research program: Secondary | ICD-10-CM | POA: Diagnosis not present

## 2017-10-07 DIAGNOSIS — C3402 Malignant neoplasm of left main bronchus: Secondary | ICD-10-CM

## 2017-10-07 DIAGNOSIS — C33 Malignant neoplasm of trachea: Secondary | ICD-10-CM

## 2017-10-07 DIAGNOSIS — R600 Localized edema: Secondary | ICD-10-CM | POA: Diagnosis not present

## 2017-10-07 DIAGNOSIS — I700 Atherosclerosis of aorta: Secondary | ICD-10-CM | POA: Insufficient documentation

## 2017-10-07 DIAGNOSIS — C3480 Malignant neoplasm of overlapping sites of unspecified bronchus and lung: Secondary | ICD-10-CM

## 2017-10-07 LAB — CBC WITH DIFFERENTIAL/PLATELET
BASOS ABS: 0.1 10*3/uL (ref 0–0.1)
Basophils Relative: 1 %
EOS PCT: 3 %
Eosinophils Absolute: 0.2 10*3/uL (ref 0–0.7)
HCT: 42.2 % (ref 40.0–52.0)
Hemoglobin: 14.4 g/dL (ref 13.0–18.0)
LYMPHS PCT: 29 %
Lymphs Abs: 1.8 10*3/uL (ref 1.0–3.6)
MCH: 31.8 pg (ref 26.0–34.0)
MCHC: 34.2 g/dL (ref 32.0–36.0)
MCV: 92.9 fL (ref 80.0–100.0)
Monocytes Absolute: 0.5 10*3/uL (ref 0.2–1.0)
Monocytes Relative: 9 %
NEUTROS ABS: 3.6 10*3/uL (ref 1.4–6.5)
Neutrophils Relative %: 58 %
PLATELETS: 153 10*3/uL (ref 150–440)
RBC: 4.54 MIL/uL (ref 4.40–5.90)
RDW: 13.1 % (ref 11.5–14.5)
WBC: 6.1 10*3/uL (ref 3.8–10.6)

## 2017-10-07 LAB — COMPREHENSIVE METABOLIC PANEL
ALT: 15 U/L — ABNORMAL LOW (ref 17–63)
ANION GAP: 8 (ref 5–15)
AST: 22 U/L (ref 15–41)
Albumin: 4.2 g/dL (ref 3.5–5.0)
Alkaline Phosphatase: 44 U/L (ref 38–126)
BILIRUBIN TOTAL: 0.6 mg/dL (ref 0.3–1.2)
BUN: 17 mg/dL (ref 6–20)
CO2: 28 mmol/L (ref 22–32)
Calcium: 9.4 mg/dL (ref 8.9–10.3)
Chloride: 99 mmol/L — ABNORMAL LOW (ref 101–111)
Creatinine, Ser: 1.12 mg/dL (ref 0.61–1.24)
Glucose, Bld: 104 mg/dL — ABNORMAL HIGH (ref 65–99)
POTASSIUM: 4 mmol/L (ref 3.5–5.1)
Sodium: 135 mmol/L (ref 135–145)
TOTAL PROTEIN: 7.4 g/dL (ref 6.5–8.1)

## 2017-10-07 LAB — URINALYSIS, COMPLETE (UACMP) WITH MICROSCOPIC
BILIRUBIN URINE: NEGATIVE
Bacteria, UA: NONE SEEN
GLUCOSE, UA: NEGATIVE mg/dL
HGB URINE DIPSTICK: NEGATIVE
KETONES UR: NEGATIVE mg/dL
Leukocytes, UA: NEGATIVE
NITRITE: NEGATIVE
PROTEIN: NEGATIVE mg/dL
Specific Gravity, Urine: 1.005 (ref 1.005–1.030)
Squamous Epithelial / LPF: NONE SEEN
pH: 5 (ref 5.0–8.0)

## 2017-10-07 LAB — MAGNESIUM: Magnesium: 2 mg/dL (ref 1.7–2.4)

## 2017-10-07 MED ORDER — SODIUM CHLORIDE 0.9 % IJ SOLN
10.0000 mL | INTRAMUSCULAR | Status: DC | PRN
  Administered 2017-10-07: 10 mL
  Filled 2017-10-07: qty 10

## 2017-10-07 MED ORDER — SODIUM CHLORIDE 0.9 % IV SOLN
500.0000 mg/m2 | Freq: Once | INTRAVENOUS | Status: AC
  Administered 2017-10-07: 1050 mg via INTRAVENOUS
  Filled 2017-10-07: qty 42

## 2017-10-07 MED ORDER — HEPARIN SOD (PORK) LOCK FLUSH 100 UNIT/ML IV SOLN
500.0000 [IU] | Freq: Once | INTRAVENOUS | Status: AC | PRN
  Administered 2017-10-07: 500 [IU]
  Filled 2017-10-07: qty 5

## 2017-10-07 MED ORDER — INFLUENZA VAC SPLIT QUAD 0.5 ML IM SUSY
0.5000 mL | PREFILLED_SYRINGE | Freq: Once | INTRAMUSCULAR | Status: AC
  Administered 2017-10-07: 0.5 mL via INTRAMUSCULAR

## 2017-10-07 MED ORDER — SODIUM CHLORIDE 0.9 % IV SOLN
Freq: Once | INTRAVENOUS | Status: AC
  Administered 2017-10-07: 15:00:00 via INTRAVENOUS
  Filled 2017-10-07: qty 1000

## 2017-10-07 MED ORDER — SODIUM CHLORIDE 0.9 % IV SOLN
Freq: Once | INTRAVENOUS | Status: AC
  Administered 2017-10-07: 15:00:00 via INTRAVENOUS
  Filled 2017-10-07: qty 4

## 2017-10-07 NOTE — Progress Notes (Signed)
Patient here for follow-up for lung cancer.

## 2017-10-07 NOTE — Assessment & Plan Note (Addendum)
Stage IV NED. Patient currently on Alimta maintenance since 2012. AUG 6th  2018- CT chest abdomen pelvis NED; No clinical evidence of progression at this time.   #  Proceed with chemo/ alimta today;  Labs today reviewed;  acceptable for treatment today.   # Hyponatremia- sodium 135. improved. Monitor for now.   # Bil LE edema Grade 1; continue Lasix/aldactone daily.   # Follow-up with me in 3 weeks labs/chemo- CT prior. Discussed with the clinical trials nurses.

## 2017-10-07 NOTE — Progress Notes (Signed)
[facility] OFFICE PROGRESS NOTE  Patient Care Team: [name], [name], stage 4 [facility])   Staging form: Lung, AJCC 7th Edition     Clinical stage from 05/24/2015: Stage IV ([place], NX, [name], MD on 05/24/2015    Oncology History   # 2012- METASTATIC ADENO CA of LEFT LUNG [acinar pattern] s/p MED LN Bx [NEG- EGFR/K-ras/ALK mutation];[name]; [name]; CT JULY 2016- NED; JAN 2018- NED  # Thoracic Aneurysm [4cm- stable]     Cancer of hilus of left lung ([place])   07/20/2016 Initial Diagnosis    Cancer of hilus of left lung ([place])      INTERVAL HISTORY:  [name] with above history of metastatic adenocarcinoma the lung currently on maintenance Alimta since June 2012 is here for follow-up prior to his chemotherapy.   Just returned back from [place]. Trip was uneventful. Patient had to miss the last treatment because of his travel.  Denies any significant shortness of breath. Appetite is good. No weight loss.  He denies any fatigue.  Patient denies any significant cough. Denies any headaches.  Denies any vision changes.   REVIEW OF SYSTEMS:  A complete 10 point review of system is done which is negative except mentioned above/history of present illness.   PAST MEDICAL HISTORY :  Past Medical History:  Diagnosis Date  . Allergy   . Lung cancer ([place])   . Mild valvular heart disease Nov. 2014   per 2 D Echocardiogram done for persistent leg edema, monitored by cardiology  . Peripheral edema     PAST SURGICAL HISTORY :   Past Surgical History:  Procedure Laterality Date  . ELBOW SURGERY    . Left anterior thoracotomy with biopsy  March 2012  . PORTACATH PLACEMENT  March 2012  . TONSILLECTOMY      FAMILY HISTORY :   Family History   Problem Relation Age of Onset  . Leukemia Father   . Diabetes Mother     SOCIAL HISTORY:   Social History   Tobacco Use  . Smoking status: Never Smoker  . Smokeless tobacco: Never Used  Substance Use Topics  . Alcohol use: Yes    Alcohol/week: 0.0 oz    Comment: "social drinker"   . Drug use: No    ALLERGIES:  is allergic to penicillins.  MEDICATIONS:  Current Outpatient Medications  Medication Sig Dispense Refill  . folic acid (FOLVITE) 1 MG tablet TAKE 1 TABLET (1 MG TOTAL) BY MOUTH DAILY. 30 tablet 11  . furosemide (LASIX) 20 MG tablet TAKE 1 TABLET (20 MG TOTAL) BY MOUTH DAILY. ONE TABLET DAILY AS NEEDED FOR SWELLING 90 tablet 1  . meloxicam (MOBIC) 7.5 MG tablet Take 7.5 mg by mouth daily.     . Multiple Vitamin (MULTIVITAMIN) capsule Take 1 capsule by mouth daily. Reported on 02/24/2016    . Omega-3 Fatty Acids (FISH OIL) 1000 MG CAPS Take 1 capsule by mouth daily. Reported on 02/24/2016    . spironolactone (ALDACTONE) 25 MG tablet Take 1 tablet (25 mg total) by mouth daily. 90 tablet 3  . vitamin B-12 (CYANOCOBALAMIN) 1000 MCG tablet Take 1,000 mcg by mouth daily.    Marland Kitchen loratadine (CLARITIN) 10 MG tablet Take 1 tablet (10 mg total) by mouth daily. (Patient not taking: Reported  on 10/07/2017)    . ondansetron (ZOFRAN-ODT) 4 MG disintegrating tablet Take 1 tablet (4 mg total) by mouth every 4 (four) hours as needed for nausea or vomiting. (Patient not taking: Reported on 08/23/2017) 20 tablet 2  . senna (SENOKOT) 8.6 MG tablet Take 1 tablet by mouth daily. Take as needed around time of chemotherapy    . tadalafil (CIALIS) 20 MG tablet Take 1 tablet by mouth every 2 days as needed. (Patient not taking: Reported on 10/07/2017) 6 tablet 11   No current facility-administered medications for this visit.    Facility-Administered Medications Ordered in Other Visits  Medication Dose Route Frequency Provider Last Rate Last Dose  . sodium chloride 0.9 % injection 10 mL  10 mL  Intracatheter PRN [name], MD   10 mL at 05/27/15 1543  . sodium chloride 0.9 % injection 10 mL  10 mL Intracatheter PRN [name], MD   10 mL at 06/17/15 1510  . sodium chloride 0.9 % injection 10 mL  10 mL Intravenous PRN [name], MD   10 mL at 07/08/15 1340    PHYSICAL EXAMINATION: ECOG PERFORMANCE STATUS: 0 - Asymptomatic  BP 139/90 (BP Location: Left Arm, Patient Position: Sitting)   Pulse 70   Temp 97.8 F (36.6 C) (Tympanic)   Resp 18   Ht '5\' 10"'  (1.778 m)   Wt 207 lb (93.9 kg)   BMI 29.70 kg/m   Filed Weights   10/07/17 1355  Weight: 207 lb (93.9 kg)     GENERAL: Well-nourished well-developed; Alert, [name]; good dentition  NECK: supple, [name], [name]; 1+ bilateral lower extremity swelling. ABDOMEN:abdomen soft, [name];  LABORATORY DATA:  I have reviewed the data as listed    Component Value Date/Time   NA 135 10/07/2017 1331   NA 138 12/10/2014 1341   K 4.0 10/07/2017 1331   K 3.6 12/10/2014 1341   CL 99 (L) 10/07/2017 1331   CL 102 12/10/2014 1341   CO2 28 10/07/2017 1331   CO2 30 12/10/2014 1341   GLUCOSE 104 (H) 10/07/2017 1331   GLUCOSE 160 (H) 12/10/2014 1341   BUN 17 10/07/2017 1331   BUN 21 (H) 12/10/2014 1341   CREATININE 1.12 10/07/2017 1331   CREATININE 1.10 03/25/2015 1453   CALCIUM 9.4 10/07/2017 1331   CALCIUM 8.4 (L) 12/10/2014 1341   PROT 7.4 10/07/2017 1331   PROT 6.6 12/10/2014 1341   ALBUMIN 4.2 10/07/2017 1331   ALBUMIN 3.4  12/10/2014 1341   AST 22 10/07/2017 1331   AST 17 12/10/2014 1341   ALT 15 (L) 10/07/2017 1331   ALT 23 12/10/2014 1341   ALKPHOS 44 10/07/2017 1331   ALKPHOS 50 12/10/2014 1341   BILITOT 0.6 10/07/2017 1331   BILITOT 0.5 12/10/2014 1341   GFRNONAA >60 10/07/2017 1331   GFRNONAA >60 03/25/2015 1453   GFRAA >60 10/07/2017 1331   GFRAA >60 03/25/2015 1453    No results found for: SPEP, UPEP  Lab Results  Component Value Date   WBC 6.1 10/07/2017  NEUTROABS 3.6 10/07/2017   HGB 14.4 10/07/2017   HCT 42.2 10/07/2017   MCV 92.9 10/07/2017   PLT 153 10/07/2017      Chemistry      Component Value Date/Time   NA 135 10/07/2017 1331   NA 138 12/10/2014 1341   K 4.0 10/07/2017 1331   K 3.6 12/10/2014 1341   CL 99 (L) 10/07/2017 1331   CL 102 12/10/2014 1341   CO2 28 10/07/2017 1331   CO2 30 12/10/2014 1341   BUN 17 10/07/2017 1331   BUN 21 (H) 12/10/2014 1341   CREATININE 1.12 10/07/2017 1331   CREATININE 1.10 03/25/2015 1453      Component Value Date/Time   CALCIUM 9.4 10/07/2017 1331   CALCIUM 8.4 (L) 12/10/2014 1341   ALKPHOS 44 10/07/2017 1331   ALKPHOS 50 12/10/2014 1341   AST 22 10/07/2017 1331   AST 17 12/10/2014 1341   ALT 15 (L) 10/07/2017 1331   ALT 23 12/10/2014 1341   BILITOT 0.6 10/07/2017 1331   BILITOT 0.5 12/10/2014 1341     IMPRESSION: 1. No evidence to suggest recurrent or metastatic disease in the chest or abdomen on today's examination. 2. Aortic atherosclerosis with mild aneurysmal dilatation of ascending thoracic aorta (4.5 cm in diameter). Ascending thoracic aortic aneurysm. Recommend semi-annual imaging followup by CTA or MRA and referral to cardiothoracic surgery if not already obtained. This recommendation follows 2010 ACCF/AHA/AATS/ACR/ASA/SCA/SCAI/SIR/STS/SVM Guidelines for the Diagnosis and Management of Patients With Thoracic Aortic Disease. Circulation. 2010; 121: H212-Y482. Aortic Atherosclerosis (ICD10-I70.0), Aortic aneurysm  NOS (ICD10-I71.9).   Electronically Signed   By: Vinnie Langton M.D.   On: 07/08/2017 08:45  RADIOGRAPHIC STUDIES: I have personally reviewed the radiological images as listed and agreed with the findings in the report. No results found.   ASSESSMENT & PLAN:  Cancer of hilus of left lung ([place]) Stage IV NED. Patient currently on Alimta maintenance since 2012. AUG 6th  2018- CT chest abdomen pelvis NED; No clinical evidence of progression at this time.   #  Proceed with chemo/ alimta today;  Labs today reviewed;  acceptable for treatment today.   # Hyponatremia- sodium 135. improved. Monitor for now.   # Bil LE edema Grade 1; continue Lasix/aldactone daily.   # Follow-up with me in 3 weeks labs/chemo- CT prior. Discussed with the clinical trials nurses.   No orders of the defined types were placed in this encounter.     [name], MD 10/08/2017 8:18 AM

## 2017-10-07 NOTE — Progress Notes (Signed)
Mitchell Mosley returns to clinic today for consideration of cycle #611 Alimta infusion on the [employer] research study. Patient skipped cycle #610 due to the death of his Mother requiring him to go to [place] last month. [name] denies experiencing any problems during his travels including lengthy flights to and from [place]. Patient also denies any new problems during his absence from the clinic. His VS remain stable with B/P slightly elevated at 130/90. His weight is also stable at 93.9kg (207lbs). Continues to have trace edema in his BLE and takes Lasix and Spironolactone daily for the edema. He also confirms that he is taking Folic Acid and Mobic daily. All other medications on his med list are taken as needed only. Patient does not report any change in any of his existing reported AEs.   Patient's glucose is slightly elevated at 104 non-fasting and his sodium level has returned to low normal.  CBC and chemistry levels are within acceptable parameters for patient to receive his treatment today. Will resume Alimta infusions every 3 weeks with no modifications per protocol at present, and patient will receive a vitamin B12 injection next week.  Patient previously reported that his wife had a subdural hematoma on 07/14/17 while they were on vacation requiring an emergency craniotomy, and he confirms that she is still recovering and doing well. Patient requested and was ordered an influenza vaccine that will be administered while in clinic today. Current adverse events with grade and attribution as follows:   Adverse Event Log Study/Protocol: [name] S130 Cycle: 681-157 Event Grade Onset Date Resolved Date Drug Name Pemetrexed Attribution Treatment Comments  Edema (mild) Grade 1 05/30/12 ongoing  Possible  Trace edema in ble up to calf  Fatigue Grade1 02/03/16 ongoing  Possible    Tendonitis Grade 1 09/21/16 ongoing  Unrelated None   Hyponatremia Grade 1 04/19/17 10/07/17  Possible  Sodium 135  Pruritis  Grade 1 04/19/17 ongoing  Possible None C/o deep itching under the skin of his palms and feet.   Yolande Jolly, BSN, MHA, OCN 10/07/2017  2:27 PM

## 2017-10-10 ENCOUNTER — Other Ambulatory Visit: Payer: Self-pay | Admitting: *Deleted

## 2017-10-10 DIAGNOSIS — C3402 Malignant neoplasm of left main bronchus: Secondary | ICD-10-CM

## 2017-10-14 ENCOUNTER — Ambulatory Visit: Payer: BLUE CROSS/BLUE SHIELD

## 2017-10-21 ENCOUNTER — Telehealth: Payer: Self-pay | Admitting: *Deleted

## 2017-10-21 ENCOUNTER — Ambulatory Visit
Admission: RE | Admit: 2017-10-21 | Discharge: 2017-10-21 | Disposition: A | Discharge status: Home / Self Care | Payer: BLUE CROSS/BLUE SHIELD | Source: Ambulatory Visit | Attending: Internal Medicine | Admitting: Internal Medicine

## 2017-10-21 DIAGNOSIS — C3490 Malignant neoplasm of unspecified part of unspecified bronchus or lung: Secondary | ICD-10-CM | POA: Diagnosis not present

## 2017-10-21 DIAGNOSIS — I700 Atherosclerosis of aorta: Secondary | ICD-10-CM | POA: Diagnosis not present

## 2017-10-21 DIAGNOSIS — C3402 Malignant neoplasm of left main bronchus: Secondary | ICD-10-CM | POA: Diagnosis not present

## 2017-10-21 MED ORDER — IOPAMIDOL (ISOVUE-300) INJECTION 61%
100.0000 mL | Freq: Once | INTRAVENOUS | Status: AC | PRN
  Administered 2017-10-21: 100 mL via INTRAVENOUS

## 2017-10-21 NOTE — Telephone Encounter (Signed)
T/C made to patient Mitchell Mosley at Dr. Aletha Halim request to give him CT results. Patient informed that his CT shows "No evidence of local recurrence or metastatic disease." Patient voices that he is glad to hear this and will see [place] next Monday for his infusion as scheduled. Yolande Jolly, BSN, MHA, OCN 10/21/2017 4:07 PM

## 2017-10-28 ENCOUNTER — Other Ambulatory Visit: Payer: Self-pay

## 2017-10-28 ENCOUNTER — Inpatient Hospital Stay (HOSPITAL_BASED_OUTPATIENT_CLINIC_OR_DEPARTMENT_OTHER): Payer: BLUE CROSS/BLUE SHIELD | Admitting: Internal Medicine

## 2017-10-28 ENCOUNTER — Inpatient Hospital Stay: Payer: BLUE CROSS/BLUE SHIELD

## 2017-10-28 ENCOUNTER — Encounter: Payer: Self-pay | Admitting: *Deleted

## 2017-10-28 VITALS — BP 117/78 | HR 84 | Temp 95.8°F | Resp 18 | Wt 209.2 lb

## 2017-10-28 DIAGNOSIS — C33 Malignant neoplasm of trachea: Secondary | ICD-10-CM

## 2017-10-28 DIAGNOSIS — R600 Localized edema: Secondary | ICD-10-CM

## 2017-10-28 DIAGNOSIS — I700 Atherosclerosis of aorta: Secondary | ICD-10-CM | POA: Diagnosis not present

## 2017-10-28 DIAGNOSIS — I712 Thoracic aortic aneurysm, without rupture: Secondary | ICD-10-CM

## 2017-10-28 DIAGNOSIS — C3402 Malignant neoplasm of left main bronchus: Secondary | ICD-10-CM

## 2017-10-28 DIAGNOSIS — C7802 Secondary malignant neoplasm of left lung: Secondary | ICD-10-CM

## 2017-10-28 DIAGNOSIS — I38 Endocarditis, valve unspecified: Secondary | ICD-10-CM | POA: Diagnosis not present

## 2017-10-28 DIAGNOSIS — C3491 Malignant neoplasm of unspecified part of right bronchus or lung: Secondary | ICD-10-CM | POA: Diagnosis not present

## 2017-10-28 DIAGNOSIS — Z23 Encounter for immunization: Secondary | ICD-10-CM | POA: Diagnosis not present

## 2017-10-28 DIAGNOSIS — Z79899 Other long term (current) drug therapy: Secondary | ICD-10-CM

## 2017-10-28 DIAGNOSIS — Z5111 Encounter for antineoplastic chemotherapy: Secondary | ICD-10-CM | POA: Diagnosis not present

## 2017-10-28 DIAGNOSIS — C3480 Malignant neoplasm of overlapping sites of unspecified bronchus and lung: Secondary | ICD-10-CM

## 2017-10-28 DIAGNOSIS — Z006 Encounter for examination for normal comparison and control in clinical research program: Secondary | ICD-10-CM

## 2017-10-28 LAB — COMPREHENSIVE METABOLIC PANEL
ALT: 16 U/L — ABNORMAL LOW (ref 17–63)
ANION GAP: 9 (ref 5–15)
AST: 25 U/L (ref 15–41)
Albumin: 4.1 g/dL (ref 3.5–5.0)
Alkaline Phosphatase: 41 U/L (ref 38–126)
BILIRUBIN TOTAL: 1.1 mg/dL (ref 0.3–1.2)
BUN: 16 mg/dL (ref 6–20)
CO2: 26 mmol/L (ref 22–32)
Calcium: 9.1 mg/dL (ref 8.9–10.3)
Chloride: 97 mmol/L — ABNORMAL LOW (ref 101–111)
Creatinine, Ser: 1.08 mg/dL (ref 0.61–1.24)
GLUCOSE: 121 mg/dL — AB (ref 65–99)
POTASSIUM: 3.7 mmol/L (ref 3.5–5.1)
SODIUM: 132 mmol/L — AB (ref 135–145)
TOTAL PROTEIN: 7 g/dL (ref 6.5–8.1)

## 2017-10-28 LAB — CBC WITH DIFFERENTIAL/PLATELET
BASOS ABS: 0 10*3/uL (ref 0–0.1)
BASOS PCT: 1 %
Eosinophils Absolute: 0.1 10*3/uL (ref 0–0.7)
Eosinophils Relative: 2 %
HEMATOCRIT: 40.4 % (ref 40.0–52.0)
HEMOGLOBIN: 13.8 g/dL (ref 13.0–18.0)
LYMPHS PCT: 33 %
Lymphs Abs: 1.4 10*3/uL (ref 1.0–3.6)
MCH: 31.7 pg (ref 26.0–34.0)
MCHC: 34.2 g/dL (ref 32.0–36.0)
MCV: 92.5 fL (ref 80.0–100.0)
Monocytes Absolute: 0.5 10*3/uL (ref 0.2–1.0)
Monocytes Relative: 11 %
NEUTROS ABS: 2.2 10*3/uL (ref 1.4–6.5)
NEUTROS PCT: 53 %
Platelets: 153 10*3/uL (ref 150–440)
RBC: 4.37 MIL/uL — AB (ref 4.40–5.90)
RDW: 13.4 % (ref 11.5–14.5)
WBC: 4.2 10*3/uL (ref 3.8–10.6)

## 2017-10-28 MED ORDER — SODIUM CHLORIDE 0.9 % IV SOLN
Freq: Once | INTRAVENOUS | Status: AC
  Administered 2017-10-28: 15:00:00 via INTRAVENOUS
  Filled 2017-10-28: qty 1000

## 2017-10-28 MED ORDER — SODIUM CHLORIDE 0.9 % IV SOLN
Freq: Once | INTRAVENOUS | Status: AC
  Administered 2017-10-28: 15:00:00 via INTRAVENOUS
  Filled 2017-10-28: qty 4

## 2017-10-28 MED ORDER — CYANOCOBALAMIN 1000 MCG/ML IJ SOLN
1000.0000 ug | Freq: Once | INTRAMUSCULAR | Status: AC
  Administered 2017-10-28: 1000 ug via INTRAMUSCULAR
  Filled 2017-10-28: qty 1

## 2017-10-28 MED ORDER — HEPARIN SOD (PORK) LOCK FLUSH 100 UNIT/ML IV SOLN
500.0000 [IU] | Freq: Once | INTRAVENOUS | Status: AC
  Administered 2017-10-28: 500 [IU] via INTRAVENOUS
  Filled 2017-10-28: qty 5

## 2017-10-28 MED ORDER — SODIUM CHLORIDE 0.9% FLUSH
10.0000 mL | INTRAVENOUS | Status: DC | PRN
  Filled 2017-10-28: qty 10

## 2017-10-28 MED ORDER — SODIUM CHLORIDE 0.9 % IV SOLN
500.0000 mg/m2 | Freq: Once | INTRAVENOUS | Status: AC
  Administered 2017-10-28: 1050 mg via INTRAVENOUS
  Filled 2017-10-28: qty 42

## 2017-10-28 NOTE — Assessment & Plan Note (Addendum)
Stage IV NED. Patient currently on Alimta maintenance since 2012. NOV 16th  2018- CT chest abdomen pelvis NED; No clinical evidence of progression at this time.   #  Proceed with chemo/ alimta today;  Labs today reviewed;  acceptable for treatment today.   # Hyponatremia- sodium 132.  Stable. Monitor for now.   # Bil LE edema Grade 1; continue Lasix/aldactone daily.   # Follow-up with me in 3 weeks labs/chemo.   # I reviewed the blood work- with the patient in detail; also reviewed the imaging independently [as summarized above]; and with the patient in detail.

## 2017-10-28 NOTE — Progress Notes (Signed)
Proceed with Alimta and B12 injection today per Ulis Rias, RN, Research

## 2017-10-28 NOTE — Progress Notes (Signed)
Here for follow up

## 2017-10-28 NOTE — Progress Notes (Signed)
[facility] OFFICE PROGRESS NOTE  Patient Care Team: [name], [name], stage 4 [facility])   Staging form: Lung, AJCC 7th Edition     Clinical stage from 05/24/2015: Stage IV ([place], NX, [name], MD on 05/24/2015    Oncology History   # 2012- METASTATIC ADENO CA of LEFT LUNG [acinar pattern] s/p MED LN Bx [NEG- EGFR/K-ras/ALK mutation];[name]; [name]; CT JULY 2016- NED; JAN 2018- NED  # Thoracic Aneurysm [4cm- stable]     Cancer of hilus of left lung ([place])   07/20/2016 Initial Diagnosis    Cancer of hilus of left lung ([place])       INTERVAL HISTORY:  [name] with above history of metastatic adenocarcinoma the lung currently on maintenance Alimta since June 2012 is here for follow-up prior to his chemotherapy/and review the results of his restaging CAT scan.  Patient denies any back pain.  Denies any shortness of breath or cough.  Denies any headaches.  No nausea no vomiting.  Denies any chest pain.  Denies any significant shortness of breath. Appetite is good. No weight loss.  He denies any fatigue.  Patient denies any significant cough. Denies any headaches.  Denies any vision changes.   REVIEW OF SYSTEMS:  A complete 10 point review of system is done which is negative except mentioned above/history of present illness.   PAST MEDICAL HISTORY :  Past Medical History:  Diagnosis Date  . Allergy   . Lung cancer ([place])   . Mild valvular heart disease Nov. 2014   per 2 D Echocardiogram done for persistent leg edema, monitored by cardiology  . Peripheral edema     PAST SURGICAL HISTORY :   Past Surgical History:  Procedure Laterality Date  . ELBOW SURGERY    . Left anterior thoracotomy with biopsy  March 2012  . PORTACATH  PLACEMENT  March 2012  . TONSILLECTOMY      FAMILY HISTORY :   Family History  Problem Relation Age of Onset  . Leukemia Father   . Diabetes Mother     SOCIAL HISTORY:   Social History   Tobacco Use  . Smoking status: Never Smoker  . Smokeless tobacco: Never Used  Substance Use Topics  . Alcohol use: Yes    Alcohol/week: 0.0 oz    Comment: "social drinker"   . Drug use: No    ALLERGIES:  is allergic to penicillins.  MEDICATIONS:  Current Outpatient Medications  Medication Sig Dispense Refill  . folic acid (FOLVITE) 1 MG tablet TAKE 1 TABLET (1 MG TOTAL) BY MOUTH DAILY. 30 tablet 11  . furosemide (LASIX) 20 MG tablet TAKE 1 TABLET (20 MG TOTAL) BY MOUTH DAILY. ONE TABLET DAILY AS NEEDED FOR SWELLING 90 tablet 1  . meloxicam (MOBIC) 7.5 MG tablet Take 7.5 mg by mouth daily.     . cimetidine (TAGAMET) 400 MG tablet Take by mouth.    . loratadine (CLARITIN) 10 MG tablet Take 1 tablet (10 mg total) by mouth daily. (Patient not taking: Reported on 10/07/2017)    . Multiple Vitamin (MULTIVITAMIN) capsule Take 1 capsule by mouth daily. Reported on 02/24/2016    . Omega-3 Fatty Acids (FISH OIL) 1000 MG CAPS Take 1 capsule by mouth daily. Reported on 02/24/2016    . ondansetron (  ZOFRAN-ODT) 4 MG disintegrating tablet Take 1 tablet (4 mg total) by mouth every 4 (four) hours as needed for nausea or vomiting. (Patient not taking: Reported on 08/23/2017) 20 tablet 2  . senna (SENOKOT) 8.6 MG tablet Take 1 tablet by mouth daily. Take as needed around time of chemotherapy    . spironolactone (ALDACTONE) 25 MG tablet Take 1 tablet (25 mg total) by mouth daily. (Patient not taking: Reported on 10/28/2017) 90 tablet 3  . tadalafil (CIALIS) 20 MG tablet Take 1 tablet by mouth every 2 days as needed. (Patient not taking: Reported on 10/07/2017) 6 tablet 11  . vitamin B-12 (CYANOCOBALAMIN) 1000 MCG tablet Take 1,000 mcg by mouth daily.     No current facility-administered medications for this visit.     Facility-Administered Medications Ordered in Other Visits  Medication Dose Route Frequency Provider Last Rate Last Dose  . sodium chloride 0.9 % injection 10 mL  10 mL Intracatheter PRN [name], MD   10 mL at 05/27/15 1543  . sodium chloride 0.9 % injection 10 mL  10 mL Intracatheter PRN [name], MD   10 mL at 06/17/15 1510  . sodium chloride 0.9 % injection 10 mL  10 mL Intravenous PRN [name], MD   10 mL at 07/08/15 1340    PHYSICAL EXAMINATION: ECOG PERFORMANCE STATUS: 0 - Asymptomatic  BP 117/78 (BP Location: Left Arm, Patient Position: Sitting)   Pulse 84   Temp (!) 95.8 F (35.4 C) (Tympanic)   Resp 18   Wt 209 lb 3.2 oz (94.9 kg)   BMI 30.02 kg/m   Filed Weights   10/28/17 1404  Weight: 209 lb 3.2 oz (94.9 kg)     GENERAL: Well-nourished well-developed; Alert, [name]; good dentition  NECK: supple, [name], [name]; 1+ bilateral lower extremity swelling. ABDOMEN:abdomen soft, [name];  LABORATORY DATA:  I have reviewed the data as listed    Component Value Date/Time   NA 132 (L) 10/28/2017 1146   NA 138 12/10/2014 1341   K 3.7 10/28/2017 1146   K 3.6 12/10/2014 1341   CL 97 (L) 10/28/2017 1146   CL 102 12/10/2014 1341   CO2 26 10/28/2017 1146   CO2 30 12/10/2014 1341   GLUCOSE 121 (H) 10/28/2017 1146   GLUCOSE 160 (H) 12/10/2014 1341   BUN 16 10/28/2017 1146   BUN 21 (H) 12/10/2014 1341   CREATININE 1.08 10/28/2017 1146   CREATININE 1.10 03/25/2015  1453   CALCIUM 9.1 10/28/2017 1146   CALCIUM 8.4 (L) 12/10/2014 1341   PROT 7.0 10/28/2017 1146   PROT 6.6 12/10/2014 1341   ALBUMIN 4.1 10/28/2017 1146   ALBUMIN 3.4 12/10/2014 1341   AST 25 10/28/2017 1146   AST 17 12/10/2014 1341   ALT 16 (L) 10/28/2017 1146   ALT 23 12/10/2014 1341   ALKPHOS 41 10/28/2017 1146   ALKPHOS 50 12/10/2014 1341   BILITOT 1.1 10/28/2017 1146   BILITOT 0.5 12/10/2014 1341   GFRNONAA >60 10/28/2017 1146   GFRNONAA >60 03/25/2015 1453  GFRAA >60 10/28/2017 1146   GFRAA >60 03/25/2015 1453    No results found for: SPEP, UPEP  Lab Results  Component Value Date   WBC 4.2 10/28/2017   NEUTROABS 2.2 10/28/2017   HGB 13.8 10/28/2017   HCT 40.4 10/28/2017   MCV 92.5 10/28/2017   PLT 153 10/28/2017      Chemistry      Component Value Date/Time   NA 132 (L) 10/28/2017 1146   NA 138 12/10/2014 1341   K 3.7 10/28/2017 1146   K 3.6 12/10/2014 1341   CL 97 (L) 10/28/2017 1146   CL 102 12/10/2014 1341   CO2 26 10/28/2017 1146   CO2 30 12/10/2014 1341   BUN 16 10/28/2017 1146   BUN 21 (H) 12/10/2014 1341   CREATININE 1.08 10/28/2017 1146   CREATININE 1.10 03/25/2015 1453      Component Value Date/Time   CALCIUM 9.1 10/28/2017 1146   CALCIUM 8.4 (L) 12/10/2014 1341   ALKPHOS 41 10/28/2017 1146   ALKPHOS 50 12/10/2014 1341   AST 25 10/28/2017 1146   AST 17 12/10/2014 1341   ALT 16 (L) 10/28/2017 1146   ALT 23 12/10/2014 1341   BILITOT 1.1 10/28/2017 1146   BILITOT 0.5 12/10/2014 1341      IMPRESSION: 1. Stable CTs of the chest and abdomen. No evidence of local recurrence or metastatic disease. 2. Stable mild dilatation of the ascending aorta and aortic atherosclerosis.   Electronically Signed   By: Richardean Sale M.D.   On: 10/21/2017 12:04  RADIOGRAPHIC STUDIES: I have personally reviewed the radiological images as listed and agreed with the findings in the report. No results found.   ASSESSMENT & PLAN:  Cancer of hilus of  left lung ([place]) Stage IV NED. Patient currently on Alimta maintenance since 2012. NOV 16th  2018- CT chest abdomen pelvis NED; No clinical evidence of progression at this time.   #  Proceed with chemo/ alimta today;  Labs today reviewed;  acceptable for treatment today.   # Hyponatremia- sodium 132.  Stable. Monitor for now.   # Bil LE edema Grade 1; continue Lasix/aldactone daily.   # Follow-up with me in 3 weeks labs/chemo.   # I reviewed the blood work- with the patient in detail; also reviewed the imaging independently [as summarized above]; and with the patient in detail.    No orders of the defined types were placed in this encounter.     [name], MD 10/28/2017 11:17 PM

## 2017-10-29 NOTE — Progress Notes (Signed)
Mr. Briski returns to clinic today for consideration of cycle #612 Alimta infusion on the [employer] research study.  Patient denies any new problems since his last clinic visit. His VS are stable tdoay. His weight is up just a little at 94.9kg (209lbs), but this increase does not warrant an Alimta dose change. Continues to have trace edema in his BLE and takes Lasix and Spironolactone daily for the edema. He also continues to take Folic Acid and Mobic daily. All AEs remain the same. Patient's glucose is slightly elevated at 121 non-fasting and his sodium level is a little low at 156mmol/L.  CBC and chemistry levels are within acceptable parameters for patient to receive his treatment today. Patient will receive Alimta infusion today with no modifications as well as a vitamin B12 injection.  Dr. Rogue Bussing in to see patient and reviewed the results of his CT scan performed on 10/21/17; which were negative for any new disease. Current adverse events with grade and attribution as follows:   Adverse Event Log Study/Protocol: [name] S130 Cycle: 611 Event Grade Onset Date Resolved Date Drug Name Pemetrexed Attribution Treatment Comments  Edema (mild) Grade 1 05/30/12 ongoing  Possible  Trace edema in ble up to calf  Fatigue Grade1 02/03/16 ongoing  Possible    Tendonitis Grade 1 09/21/16 ongoing  Unrelated None   Hyponatremia Grade 1 10/28/17   Possible  Sodium 132  Pruritis Grade 1 04/19/17 ongoing  Possible None C/o deep itching under the skin of his palms and feet.   Yolande Jolly, BSN, MHA, OCN 10/28/17  3:27 PM

## 2017-11-15 ENCOUNTER — Inpatient Hospital Stay (HOSPITAL_BASED_OUTPATIENT_CLINIC_OR_DEPARTMENT_OTHER): Payer: BLUE CROSS/BLUE SHIELD | Admitting: Internal Medicine

## 2017-11-15 ENCOUNTER — Inpatient Hospital Stay: Payer: BLUE CROSS/BLUE SHIELD | Attending: Internal Medicine

## 2017-11-15 ENCOUNTER — Encounter: Payer: Self-pay | Admitting: *Deleted

## 2017-11-15 ENCOUNTER — Inpatient Hospital Stay: Payer: BLUE CROSS/BLUE SHIELD

## 2017-11-15 ENCOUNTER — Ambulatory Visit
Admission: RE | Admit: 2017-11-15 | Discharge: 2017-11-15 | Disposition: A | Discharge status: Home / Self Care | Payer: BLUE CROSS/BLUE SHIELD | Source: Ambulatory Visit | Attending: Internal Medicine | Admitting: Internal Medicine

## 2017-11-15 ENCOUNTER — Encounter: Payer: Self-pay | Admitting: Internal Medicine

## 2017-11-15 DIAGNOSIS — R600 Localized edema: Secondary | ICD-10-CM | POA: Diagnosis not present

## 2017-11-15 DIAGNOSIS — Z79899 Other long term (current) drug therapy: Secondary | ICD-10-CM

## 2017-11-15 DIAGNOSIS — C3402 Malignant neoplasm of left main bronchus: Secondary | ICD-10-CM | POA: Diagnosis not present

## 2017-11-15 DIAGNOSIS — C7801 Secondary malignant neoplasm of right lung: Secondary | ICD-10-CM

## 2017-11-15 DIAGNOSIS — C3492 Malignant neoplasm of unspecified part of left bronchus or lung: Secondary | ICD-10-CM

## 2017-11-15 DIAGNOSIS — I38 Endocarditis, valve unspecified: Secondary | ICD-10-CM

## 2017-11-15 DIAGNOSIS — I700 Atherosclerosis of aorta: Secondary | ICD-10-CM | POA: Insufficient documentation

## 2017-11-15 DIAGNOSIS — I712 Thoracic aortic aneurysm, without rupture: Secondary | ICD-10-CM | POA: Diagnosis not present

## 2017-11-15 DIAGNOSIS — Z5111 Encounter for antineoplastic chemotherapy: Secondary | ICD-10-CM | POA: Diagnosis not present

## 2017-11-15 DIAGNOSIS — M7989 Other specified soft tissue disorders: Secondary | ICD-10-CM | POA: Diagnosis not present

## 2017-11-15 DIAGNOSIS — Z006 Encounter for examination for normal comparison and control in clinical research program: Secondary | ICD-10-CM | POA: Insufficient documentation

## 2017-11-15 LAB — COMPREHENSIVE METABOLIC PANEL
ALK PHOS: 43 U/L (ref 38–126)
ALT: 18 U/L (ref 17–63)
ANION GAP: 9 (ref 5–15)
AST: 24 U/L (ref 15–41)
Albumin: 4.3 g/dL (ref 3.5–5.0)
BILIRUBIN TOTAL: 0.9 mg/dL (ref 0.3–1.2)
BUN: 19 mg/dL (ref 6–20)
CO2: 26 mmol/L (ref 22–32)
CREATININE: 1.01 mg/dL (ref 0.61–1.24)
Calcium: 9.5 mg/dL (ref 8.9–10.3)
Chloride: 99 mmol/L — ABNORMAL LOW (ref 101–111)
Glucose, Bld: 120 mg/dL — ABNORMAL HIGH (ref 65–99)
Potassium: 3.9 mmol/L (ref 3.5–5.1)
Sodium: 134 mmol/L — ABNORMAL LOW (ref 135–145)
TOTAL PROTEIN: 7.7 g/dL (ref 6.5–8.1)

## 2017-11-15 LAB — CBC WITH DIFFERENTIAL/PLATELET
BASOS ABS: 0.1 10*3/uL (ref 0–0.1)
BLASTS: 0 %
Band Neutrophils: 0 %
Basophils Relative: 1 %
EOS PCT: 3 %
Eosinophils Absolute: 0.2 10*3/uL (ref 0–0.7)
HCT: 42.6 % (ref 40.0–52.0)
HEMOGLOBIN: 14.4 g/dL (ref 13.0–18.0)
LYMPHS ABS: 1.4 10*3/uL (ref 1.0–3.6)
Lymphocytes Relative: 28 %
MCH: 31.5 pg (ref 26.0–34.0)
MCHC: 33.8 g/dL (ref 32.0–36.0)
MCV: 93.4 fL (ref 80.0–100.0)
METAMYELOCYTES PCT: 0 %
MONOS PCT: 10 %
MYELOCYTES: 0 %
Monocytes Absolute: 0.5 10*3/uL (ref 0.2–1.0)
NEUTROS ABS: 2.9 10*3/uL (ref 1.4–6.5)
NEUTROS PCT: 58 %
NRBC: 0 /100{WBCs}
Other: 0 %
Platelets: 157 10*3/uL (ref 150–440)
Promyelocytes Absolute: 0 %
RBC: 4.56 MIL/uL (ref 4.40–5.90)
RDW: 13.7 % (ref 11.5–14.5)
WBC: 5.1 10*3/uL (ref 3.8–10.6)

## 2017-11-15 LAB — CBC
HCT: 42.6 % (ref 40.0–52.0)
HEMOGLOBIN: 14.4 g/dL (ref 13.0–18.0)
MCH: 31.5 pg (ref 26.0–34.0)
MCHC: 33.8 g/dL (ref 32.0–36.0)
MCV: 93.4 fL (ref 80.0–100.0)
PLATELETS: 157 10*3/uL (ref 150–440)
RBC: 4.56 MIL/uL (ref 4.40–5.90)
RDW: 13.7 % (ref 11.5–14.5)
WBC: 5.1 10*3/uL (ref 3.8–10.6)

## 2017-11-15 LAB — URINALYSIS, COMPLETE (UACMP) WITH MICROSCOPIC
Bacteria, UA: NONE SEEN
Bilirubin Urine: NEGATIVE
GLUCOSE, UA: NEGATIVE mg/dL
Hgb urine dipstick: NEGATIVE
Ketones, ur: NEGATIVE mg/dL
Leukocytes, UA: NEGATIVE
Nitrite: NEGATIVE
PROTEIN: NEGATIVE mg/dL
RBC / HPF: NONE SEEN RBC/hpf (ref 0–5)
SPECIFIC GRAVITY, URINE: 1.004 — AB (ref 1.005–1.030)
SQUAMOUS EPITHELIAL / LPF: NONE SEEN
pH: 7 (ref 5.0–8.0)

## 2017-11-15 LAB — MAGNESIUM: MAGNESIUM: 2 mg/dL (ref 1.7–2.4)

## 2017-11-15 MED ORDER — SODIUM CHLORIDE 0.9 % IV SOLN
Freq: Once | INTRAVENOUS | Status: AC
  Administered 2017-11-15: 15:00:00 via INTRAVENOUS
  Filled 2017-11-15: qty 1000

## 2017-11-15 MED ORDER — INV-PEMETREXED CHEMO INJECTION 500 MG LILLY S130
500.0000 mg/m2 | Freq: Once | INTRAVENOUS | Status: AC
  Administered 2017-11-15: 1050 mg via INTRAVENOUS
  Filled 2017-11-15: qty 42

## 2017-11-15 MED ORDER — DEXAMETHASONE SODIUM PHOSPHATE 100 MG/10ML IJ SOLN
Freq: Once | INTRAMUSCULAR | Status: AC
  Administered 2017-11-15: 15:00:00 via INTRAVENOUS
  Filled 2017-11-15: qty 4

## 2017-11-15 MED ORDER — HEPARIN SOD (PORK) LOCK FLUSH 100 UNIT/ML IV SOLN
INTRAVENOUS | Status: AC
  Filled 2017-11-15: qty 5

## 2017-11-15 NOTE — Progress Notes (Signed)
[place] OFFICE PROGRESS NOTE  Patient Care Team: [name], [name], stage 4 [facility])   Staging form: Lung, AJCC 7th Edition     Clinical stage from 05/24/2015: Stage IV ([place], NX, [name], MD on 05/24/2015    Oncology History   # 2012- METASTATIC ADENO CA of LEFT LUNG [acinar pattern] s/p MED LN Bx [NEG- EGFR/K-ras/ALK mutation];[name]; [name]; CT JULY 2016- NED; JAN 2018- NED  # Thoracic Aneurysm [4cm- stable]     Cancer of hilus of left lung ([place])   07/20/2016 Initial Diagnosis    Cancer of hilus of left lung ([place])       INTERVAL HISTORY:  [name] with above history of metastatic adenocarcinoma the lung currently on maintenance Alimta since June 2012 is here for follow-up prior to his chemotherapy.  Patient noted to have increasing swelling in the legs along with cramping over the last few weeks.  He denies any erythema  Patient denies any back pain.  Denies any shortness of breath or cough.  Denies any headaches.  No nausea no vomiting.  Denies any chest pain. Denies any tingling and numbness.  REVIEW OF SYSTEMS:  A complete 10 point review of system is done which is negative except mentioned above/history of present illness.   PAST MEDICAL HISTORY :  Past Medical History:  Diagnosis Date  . Allergy   . Lung cancer ([place])   . Mild valvular heart disease Nov. 2014   per 2 D Echocardiogram done for persistent leg edema, monitored by cardiology  . Peripheral edema     PAST SURGICAL HISTORY :   Past Surgical History:  Procedure Laterality Date  . ELBOW SURGERY    . Left anterior thoracotomy with biopsy  March 2012  . PORTACATH PLACEMENT  March 2012  . TONSILLECTOMY      FAMILY HISTORY :   Family History   Problem Relation Age of Onset  . Leukemia Father   . Diabetes Mother     SOCIAL HISTORY:   Social History   Tobacco Use  . Smoking status: Never Smoker  . Smokeless tobacco: Never Used  Substance Use Topics  . Alcohol use: Yes    Alcohol/week: 0.0 oz    Comment: "social drinker"   . Drug use: No    ALLERGIES:  is allergic to penicillins.  MEDICATIONS:  Current Outpatient Medications  Medication Sig Dispense Refill  . cimetidine (TAGAMET) 400 MG tablet Take by mouth.    . folic acid (FOLVITE) 1 MG tablet TAKE 1 TABLET (1 MG TOTAL) BY MOUTH DAILY. 30 tablet 11  . furosemide (LASIX) 20 MG tablet TAKE 1 TABLET (20 MG TOTAL) BY MOUTH DAILY. ONE TABLET DAILY AS NEEDED FOR SWELLING 90 tablet 1  . loratadine (CLARITIN) 10 MG tablet Take 1 tablet (10 mg total) by mouth daily.    . meloxicam (MOBIC) 7.5 MG tablet Take 7.5 mg by mouth daily.     . Multiple Vitamin (MULTIVITAMIN) capsule Take 1 capsule by mouth daily. Reported on 02/24/2016    . Omega-3 Fatty Acids (FISH OIL) 1000 MG CAPS Take 1 capsule by mouth daily. Reported on 02/24/2016    . ondansetron (ZOFRAN-ODT) 4 MG disintegrating tablet Take 1 tablet (4 mg total) by mouth every 4 (four) hours as needed  for nausea or vomiting. 20 tablet 2  . senna (SENOKOT) 8.6 MG tablet Take 1 tablet by mouth daily. Take as needed around time of chemotherapy    . spironolactone (ALDACTONE) 25 MG tablet Take 1 tablet (25 mg total) by mouth daily. 90 tablet 3  . tadalafil (CIALIS) 20 MG tablet Take 1 tablet by mouth every 2 days as needed. 6 tablet 11  . vitamin B-12 (CYANOCOBALAMIN) 1000 MCG tablet Take 1,000 mcg by mouth daily.     No current facility-administered medications for this visit.    Facility-Administered Medications Ordered in Other Visits  Medication Dose Route Frequency Provider Last Rate Last Dose  . sodium chloride 0.9 % injection 10 mL  10 mL Intracatheter PRN [name], MD   10 mL at 05/27/15 1543  . sodium chloride 0.9  % injection 10 mL  10 mL Intracatheter PRN [name], MD   10 mL at 06/17/15 1510  . sodium chloride 0.9 % injection 10 mL  10 mL Intravenous PRN Choksi, Delorise Shiner, MD   10 mL at 07/08/15 1340    PHYSICAL EXAMINATION: ECOG PERFORMANCE STATUS: 0 - Asymptomatic  There were no vitals taken for this visit.  There were no vitals filed for this visit.   GENERAL: Well-nourished well-developed; Alert, [name]; good dentition  NECK: supple, [name], [name]; 2 bilateral lower extremity swelling. ABDOMEN:abdomen soft, [name];  LABORATORY DATA:  I have reviewed the data as listed    Component Value Date/Time   NA 134 (L) 11/15/2017 1337   NA 138 12/10/2014 1341   K 3.9 11/15/2017 1337   K 3.6 12/10/2014 1341   CL 99 (L) 11/15/2017 1337   CL 102 12/10/2014 1341   CO2 26 11/15/2017 1337   CO2 30 12/10/2014 1341   GLUCOSE 120 (H) 11/15/2017 1337   GLUCOSE 160 (H) 12/10/2014 1341   BUN 19 11/15/2017 1337   BUN 21 (H) 12/10/2014 1341   CREATININE 1.01 11/15/2017 1337   CREATININE 1.10 03/25/2015 1453   CALCIUM 9.5 11/15/2017 1337   CALCIUM 8.4 (L) 12/10/2014 1341   PROT 7.7 11/15/2017 1337   PROT 6.6 12/10/2014 1341   ALBUMIN 4.3 11/15/2017 1337   ALBUMIN 3.4 12/10/2014 1341   AST 24 11/15/2017 1337   AST 17 12/10/2014 1341   ALT 18 11/15/2017 1337   ALT 23 12/10/2014 1341   ALKPHOS 43 11/15/2017 1337   ALKPHOS 50 12/10/2014 1341   BILITOT 0.9 11/15/2017 1337   BILITOT 0.5 12/10/2014 1341    GFRNONAA >60 11/15/2017 1337   GFRNONAA >60 03/25/2015 1453   GFRAA >60 11/15/2017 1337   GFRAA >60 03/25/2015 1453    No results found for: SPEP, UPEP  Lab Results  Component Value Date   WBC 5.1 11/15/2017   WBC 5.1 11/15/2017   NEUTROABS PENDING 11/15/2017   HGB 14.4 11/15/2017   HGB 14.4 11/15/2017   HCT 42.6 11/15/2017   HCT 42.6 11/15/2017   MCV 93.4 11/15/2017   MCV 93.4 11/15/2017   PLT 157 11/15/2017  PLT 157 11/15/2017      Chemistry      Component Value Date/Time   NA 134 (L) 11/15/2017 1337   NA 138 12/10/2014 1341   K 3.9 11/15/2017 1337   K 3.6 12/10/2014 1341   CL 99 (L) 11/15/2017 1337   CL 102 12/10/2014 1341   CO2 26 11/15/2017 1337   CO2 30 12/10/2014 1341   BUN 19 11/15/2017 1337   BUN 21 (H) 12/10/2014 1341   CREATININE 1.01 11/15/2017 1337   CREATININE 1.10 03/25/2015 1453      Component Value Date/Time   CALCIUM 9.5 11/15/2017 1337   CALCIUM 8.4 (L) 12/10/2014 1341   ALKPHOS 43 11/15/2017 1337   ALKPHOS 50 12/10/2014 1341   AST 24 11/15/2017 1337   AST 17 12/10/2014 1341   ALT 18 11/15/2017 1337   ALT 23 12/10/2014 1341   BILITOT 0.9 11/15/2017 1337   BILITOT 0.5 12/10/2014 1341      IMPRESSION: 1. Stable CTs of the chest and abdomen. No evidence of local recurrence or metastatic disease. 2. Stable mild dilatation of the ascending aorta and aortic atherosclerosis.   Electronically Signed   By: Richardean Sale M.D.   On: 10/21/2017 12:04  RADIOGRAPHIC STUDIES: I have personally reviewed the radiological images as listed and agreed with the findings in the report. No results found.   ASSESSMENT & PLAN:  Cancer of hilus of left lung ([place]) Stage IV NED. Patient currently on Alimta maintenance since 2012. NOV 16th  2018- CT chest abdomen pelvis NED; No clinical evidence of progression at this time.   #  Proceed with chemo/ alimta today;  Labs today reviewed;  acceptable for treatment today.   # Bil LE edema Grade  1-2;slightly worse;? Cramping- continue Lasix/aldactone daily. UA- negative for UA.   # Follow-up with me in 3 weeks labs/chemo. Will call with results of the dopplers.   Orders Placed This Encounter  Procedures  . US Venous Img Lower Bilateral    Standing Status:   Future    Standing Expiration Date:   01/15/2019    Order Specific Question:   Reason for Exam (SYMPTOM  OR DIAGNOSIS REQUIRED)    Answer:   leg swelling    Order Specific Question:   Preferred imaging location?    Answer:   [facility]      [name], MD 11/15/2017 2:26 PM

## 2017-11-15 NOTE — Assessment & Plan Note (Addendum)
Stage IV NED. Patient currently on Alimta maintenance since 2012. NOV 16th  2018- CT chest abdomen pelvis NED; No clinical evidence of progression at this time.   #  Proceed with chemo/ alimta today;  Labs today reviewed;  acceptable for treatment today.   # Bil LE edema Grade 1-2;slightly worse;? Cramping- continue Lasix/aldactone daily. UA- negative for UA.   # Follow-up with me in 3 weeks labs/chemo. Will call with results of the dopplers.

## 2017-11-15 NOTE — Progress Notes (Signed)
Mr. Kloos returns to clinic today for consideration of cycle #613 Alimta infusion on the [employer] research study. Patient reporting increased edema in his BLE since his last clinic visit and verifies that he takes Lasix and Spironolactone daily for the edema.. He has also experienced some cramping pain in his left calf in the last few days, but states it feels OK today. Denies any pain anywhere currently. His VS are stable today. His weight is down since last visit to 93.8 kg (206.8 lbs per nurse). He also reports he is taking Folic Acid and Mobic daily. All other AEs remain the same. Patient's glucose is slightly elevated at 120 non-fasting and his sodium level remains a little low at 116mmol/L.  CBC and chemistry levels are within acceptable parameters for patient to receive his treatment today. He also had a urinalysis today, which was normal. Patient will receive Alimta infusion as scheduled today with no modifications, then go over to the main hospital for an U/S of his LLE.  Dr. Rogue Bussing in to examine patient and discussed with him that his urine protein is normal, but because he has increased edema and had some cramping, he will need to have an ultrasound to rule out a DVT. Patient agrees with this plan. Current adverse events with grade and attribution as follows:   Adverse Event Log Study/Protocol: [name] S130 Cycle: 612 Event Grade Onset Date Resolved Date Drug Name Pemetrexed Attribution Treatment Comments  Edema (mild) Grade 1 Grade 2 05/30/12  11/15/17 11/15/17   Possible  1-2+ edema in ble up to calf  Fatigue Grade1 02/03/16 ongoing  Possible    Tendonitis Grade 1 09/21/16 ongoing  Unrelated None   Hyponatremia Grade 1 10/28/17   Possible  Sodium 134  Pruritis Grade 1 04/19/17 ongoing  Possible Uses generic lotion with relief deep itching under the skin of palms & feet.   Yolande Jolly, BSN, MHA, OCN 11/15/2017 2:32 PM

## 2017-12-04 DIAGNOSIS — H6690 Otitis media, unspecified, unspecified ear: Secondary | ICD-10-CM | POA: Diagnosis not present

## 2017-12-04 DIAGNOSIS — H9209 Otalgia, unspecified ear: Secondary | ICD-10-CM | POA: Diagnosis not present

## 2017-12-05 ENCOUNTER — Telehealth: Payer: Self-pay | Admitting: *Deleted

## 2017-12-05 NOTE — Telephone Encounter (Signed)
Patient called back to confirm that he received my message. States he feels much better today since getting the antibiotic and steroid in his system. Verifies that he plans to come for his treatment tomorrow as scheduled. Yolande Jolly, BSN, MHA, OCN 12/05/2017 4:21 PM

## 2017-12-05 NOTE — Telephone Encounter (Signed)
Received t/c from patient on afternoon of 12/04/17 reporting that he has seen his EENT physician and was diagnosed with an ear infection. He was placed on Omnicef and a prednisone taper for the infection. Dr. Rogue Bussing informed of this today and states he does not believe this will interfere with the patient's scheduled chemotherapy tomorrow. T/C made back to patient to inform that he should come to his appointment as scheduled. Voicemail message left for patient to come in tomorrow as scheduled. Yolande Jolly, BSN, MHA, OCN 12/05/2017 3:11 PM

## 2017-12-06 ENCOUNTER — Inpatient Hospital Stay (HOSPITAL_BASED_OUTPATIENT_CLINIC_OR_DEPARTMENT_OTHER): Payer: BLUE CROSS/BLUE SHIELD | Admitting: Internal Medicine

## 2017-12-06 ENCOUNTER — Inpatient Hospital Stay: Payer: BLUE CROSS/BLUE SHIELD

## 2017-12-06 ENCOUNTER — Encounter: Payer: Self-pay | Admitting: *Deleted

## 2017-12-06 ENCOUNTER — Inpatient Hospital Stay: Payer: BLUE CROSS/BLUE SHIELD | Attending: Internal Medicine

## 2017-12-06 ENCOUNTER — Encounter: Payer: Self-pay | Admitting: Internal Medicine

## 2017-12-06 VITALS — BP 115/74 | HR 80 | Resp 18

## 2017-12-06 VITALS — BP 123/79 | HR 77 | Temp 97.8°F | Resp 16 | Wt 210.8 lb

## 2017-12-06 DIAGNOSIS — Z006 Encounter for examination for normal comparison and control in clinical research program: Secondary | ICD-10-CM | POA: Insufficient documentation

## 2017-12-06 DIAGNOSIS — Z5111 Encounter for antineoplastic chemotherapy: Secondary | ICD-10-CM | POA: Diagnosis not present

## 2017-12-06 DIAGNOSIS — I712 Thoracic aortic aneurysm, without rupture: Secondary | ICD-10-CM | POA: Diagnosis not present

## 2017-12-06 DIAGNOSIS — C3402 Malignant neoplasm of left main bronchus: Secondary | ICD-10-CM | POA: Insufficient documentation

## 2017-12-06 DIAGNOSIS — H9201 Otalgia, right ear: Secondary | ICD-10-CM

## 2017-12-06 DIAGNOSIS — R600 Localized edema: Secondary | ICD-10-CM

## 2017-12-06 DIAGNOSIS — C33 Malignant neoplasm of trachea: Secondary | ICD-10-CM

## 2017-12-06 DIAGNOSIS — C3480 Malignant neoplasm of overlapping sites of unspecified bronchus and lung: Secondary | ICD-10-CM

## 2017-12-06 LAB — COMPREHENSIVE METABOLIC PANEL
ALT: 17 U/L (ref 17–63)
ANION GAP: 10 (ref 5–15)
AST: 25 U/L (ref 15–41)
Albumin: 4 g/dL (ref 3.5–5.0)
Alkaline Phosphatase: 41 U/L (ref 38–126)
BILIRUBIN TOTAL: 0.8 mg/dL (ref 0.3–1.2)
BUN: 27 mg/dL — ABNORMAL HIGH (ref 6–20)
CO2: 25 mmol/L (ref 22–32)
Calcium: 9 mg/dL (ref 8.9–10.3)
Chloride: 99 mmol/L — ABNORMAL LOW (ref 101–111)
Creatinine, Ser: 1.1 mg/dL (ref 0.61–1.24)
GFR calc Af Amer: >60 mL/min (ref >60)
Glucose, Bld: 191 mg/dL — ABNORMAL HIGH (ref 65–99)
POTASSIUM: 3.9 mmol/L (ref 3.5–5.1)
Sodium: 134 mmol/L — ABNORMAL LOW (ref 135–145)
Total Protein: 7.2 g/dL (ref 6.5–8.1)

## 2017-12-06 LAB — CBC WITH DIFFERENTIAL/PLATELET
BASOS ABS: 0 10*3/uL (ref 0–0.1)
BASOS PCT: 1 %
EOS PCT: 2 %
Eosinophils Absolute: 0.1 10*3/uL (ref 0–0.7)
HEMATOCRIT: 41.7 % (ref 40.0–52.0)
Hemoglobin: 13.9 g/dL (ref 13.0–18.0)
LYMPHS PCT: 12 %
Lymphs Abs: 0.8 10*3/uL — ABNORMAL LOW (ref 1.0–3.6)
MCH: 31.7 pg (ref 26.0–34.0)
MCHC: 33.4 g/dL (ref 32.0–36.0)
MCV: 94.9 fL (ref 80.0–100.0)
Monocytes Absolute: 0.2 10*3/uL (ref 0.2–1.0)
Monocytes Relative: 3 %
NEUTROS ABS: 5.5 10*3/uL (ref 1.4–6.5)
Neutrophils Relative %: 82 %
PLATELETS: 145 10*3/uL — AB (ref 150–440)
RBC: 4.4 MIL/uL (ref 4.40–5.90)
RDW: 14.3 % (ref 11.5–14.5)
WBC: 6.6 10*3/uL (ref 3.8–10.6)

## 2017-12-06 MED ORDER — HEPARIN SOD (PORK) LOCK FLUSH 100 UNIT/ML IV SOLN
500.0000 [IU] | Freq: Once | INTRAVENOUS | Status: AC | PRN
  Administered 2017-12-06: 500 [IU]

## 2017-12-06 MED ORDER — SODIUM CHLORIDE 0.9 % IV SOLN
Freq: Once | INTRAVENOUS | Status: AC
  Administered 2017-12-06: 15:00:00 via INTRAVENOUS
  Filled 2017-12-06: qty 1000

## 2017-12-06 MED ORDER — SODIUM CHLORIDE 0.9 % IV SOLN
Freq: Once | INTRAVENOUS | Status: AC
  Administered 2017-12-06: 15:00:00 via INTRAVENOUS
  Filled 2017-12-06: qty 4

## 2017-12-06 MED ORDER — SODIUM CHLORIDE 0.9 % IV SOLN
500.0000 mg/m2 | Freq: Once | INTRAVENOUS | Status: AC
  Administered 2017-12-06: 1050 mg via INTRAVENOUS
  Filled 2017-12-06: qty 42

## 2017-12-06 NOTE — Assessment & Plan Note (Addendum)
Stage IV NED. Patient currently on Alimta maintenance since 2012. NOV 16th  2018- CT chest abdomen pelvis NED; No clinical evidence of progression at this time.   #  Proceed with chemo/ alimta today;  Labs today reviewed;  acceptable for treatment today.   # Right ear Ache- ? URI- okay proceed; with chemo today.   # Bil LE edema Grade 1-continue diuretics.  # Follow-up with me in 3 weeks labs/chemo.

## 2017-12-06 NOTE — Progress Notes (Signed)
Mitchell Mos[facility] returns to clinic today for consideration of cycle #614 Alimta infusion on the [employer] research study. Patient called earlier this week and reported an ear infection in his right ear. States he feels a lot better. He has taken [facility] for 3 days and has been on a Prednisone taper for the past 2 days. Patient reports edema in his BLE since his last clinic visit and continues to take Lasix and Spironolactone daily for the edema. He continues to experience the odd itching in his palms and the bottoms of his feet, but reports this improves with use of OTC lotion, so he does not believe it is neuropathy. Denies any pain anywhere currently. His VS are stable today. His weight is up a little since last visit at 210.8 lbs (95.6 kg). [facility] also verifies that he is taking Folic Acid and Mobic daily as prescribed. Reports an episode of nausea and states he could not find his Zofran, so he bought an OTC anti-emetic and took one pill with relief. He could not recall the name of the medication.  Patient's glucose is elevated again at 191 mg/dL non-fasting and his sodium level remains a little low at 159mmol/L. Patient's platelets are also a little low today at 145,000. CBC and chemistry levels are otherwise within acceptable parameters for patient to receive his treatment today. Patient will receive Alimta infusion as scheduled today with no dose modifications. [facility] Mosley in to examine patient and states his right ear does not look red or inflamed today. Discussed with patient that his blood glucose is elevated more due to the Prednisone he is taking. Current adverse events with grade and attribution as follows:   Adverse Event Log Study/Protocol: [facility] S130 Cycle: 613 Event Grade Onset Date Resolved Date Drug Name Pemetrexed Attribution Treatment Comments  Edema (mild) Grade 1 Grade 2 Grade 1 05/30/12  11/15/17  12/06/2017 11/15/17 12/06/2017  Possible  1+ edema in ble up to calf  Fatigue  Grade1 02/03/16 ongoing  Possible    Tendonitis Grade 1 09/21/16 ongoing  Unrelated None   Hyponatremia Grade 1 10/28/17   Possible  Sodium 134  Pruritis Grade 1 04/19/17 ongoing  Possible Uses generic lotion with relief deep itching under the skin of palms & feet.   Otitis, middle ear Grade 2 12/04/2017   Unrelated Omnicef+steroids improving  Low platelet count Grade 1 12/06/2017   Possible  Platelets 145,000  Yolande Jolly, BSN, MHA, OCN 12/06/2017  2:37 PM

## 2017-12-06 NOTE — Progress Notes (Signed)
Patient is here today for a follow up.

## 2017-12-06 NOTE — Progress Notes (Signed)
[facility] OFFICE PROGRESS NOTE  Patient Care Team: [name], [name], stage 4 [facility])   Staging form: Lung, AJCC 7th Edition     Clinical stage from 05/24/2015: Stage IV ([place], NX, [name], MD on 05/24/2015    Oncology History   # 2012- METASTATIC ADENO CA of LEFT LUNG [acinar pattern] s/p MED LN Bx [NEG- EGFR/K-ras/ALK mutation];[name]; [name]; CT JULY 2016- NED; JAN 2018- NED  # Thoracic Aneurysm [4cm- stable]     Cancer of hilus of left lung ([place])   07/20/2016 Initial Diagnosis    Cancer of hilus of left lung ([place])       INTERVAL HISTORY:  A pleasant 57 year old [place] male patient with above history of metastatic adenocarcinoma the lung currently on maintenance Alimta since June 2012 is here for follow-up prior to his chemotherapy.  Patient had a recent episode of right ear pain for which he has been prescribed antibiotic; also Prednisone. Patient denies any back pain.  Denies any shortness of breath or cough.  Denies any headaches.  No nausea no vomiting.  Denies any chest pain. Denies any tingling and numbness.  Leg swelling not any worse.  REVIEW OF SYSTEMS:  A complete 10 point review of system is done which is negative except mentioned above/history of present illness.   PAST MEDICAL HISTORY :  Past Medical History:  Diagnosis Date  . Allergy   . Lung cancer ([place])   . Mild valvular heart disease Nov. 2014   per 2 D Echocardiogram done for persistent leg edema, monitored by cardiology  . Peripheral edema     PAST SURGICAL HISTORY :   Past Surgical History:  Procedure Laterality Date  . ELBOW SURGERY    . Left anterior thoracotomy with biopsy  March 2012  . PORTACATH PLACEMENT  March 2012  . TONSILLECTOMY      FAMILY HISTORY :   Family  History  Problem Relation Age of Onset  . Leukemia Father   . Diabetes Mother     SOCIAL HISTORY:   Social History   Tobacco Use  . Smoking status: Never Smoker  . Smokeless tobacco: Never Used  Substance Use Topics  . Alcohol use: Yes    Alcohol/week: 0.0 oz    Comment: "social drinker"   . Drug use: No    ALLERGIES:  is allergic to penicillins.  MEDICATIONS:  Current Outpatient Medications  Medication Sig Dispense Refill  . cefdinir (OMNICEF) 300 MG capsule TAKE ONE TABLET TWICE DAILY FOR 10 DAYS  0  . folic acid (FOLVITE) 1 MG tablet TAKE 1 TABLET (1 MG TOTAL) BY MOUTH DAILY. 30 tablet 11  . furosemide (LASIX) 20 MG tablet TAKE 1 TABLET (20 MG TOTAL) BY MOUTH DAILY. ONE TABLET DAILY AS NEEDED FOR SWELLING 90 tablet 1  . meloxicam (MOBIC) 7.5 MG tablet Take 7.5 mg by mouth daily.     . Multiple Vitamin (MULTIVITAMIN) capsule Take 1 capsule by mouth daily. Reported on 02/24/2016    . Omega-3 Fatty Acids (FISH OIL) 1000 MG CAPS Take 1 capsule by mouth daily. Reported on 02/24/2016    . predniSONE (DELTASONE) 5 MG tablet TAKE AS DIRECTED. 6 DAY TAPER. PATIENT GIVEN HANDOUT IN OFFICE WITH INSTRUCTIONS.  0  . tadalafil (CIALIS) 20 MG tablet Take 1 tablet by mouth  every 2 days as needed. 6 tablet 11  . vitamin B-12 (CYANOCOBALAMIN) 1000 MCG tablet Take 1,000 mcg by mouth daily.    . cimetidine (TAGAMET) 400 MG tablet Take by mouth.    . loratadine (CLARITIN) 10 MG tablet Take 1 tablet (10 mg total) by mouth daily. (Patient not taking: Reported on 12/06/2017)    . ondansetron (ZOFRAN-ODT) 4 MG disintegrating tablet Take 1 tablet (4 mg total) by mouth every 4 (four) hours as needed for nausea or vomiting. (Patient not taking: Reported on 12/06/2017) 20 tablet 2  . senna (SENOKOT) 8.6 MG tablet Take 1 tablet by mouth daily. Take as needed around time of chemotherapy    . spironolactone (ALDACTONE) 25 MG tablet Take 1 tablet (25 mg total) by mouth daily. 90 tablet 3   No current  facility-administered medications for this visit.    Facility-Administered Medications Ordered in Other Visits  Medication Dose Route Frequency Provider Last Rate Last Dose  . sodium chloride 0.9 % injection 10 mL  10 mL Intracatheter PRN [name], MD   10 mL at 05/27/15 1543  . sodium chloride 0.9 % injection 10 mL  10 mL Intracatheter PRN [name], MD   10 mL at 06/17/15 1510  . sodium chloride 0.9 % injection 10 mL  10 mL Intravenous PRN [name], MD   10 mL at 07/08/15 1340    PHYSICAL EXAMINATION: ECOG PERFORMANCE STATUS: 0 - Asymptomatic  BP 123/79 (BP Location: Left Arm, Patient Position: Sitting)   Pulse 77   Temp 97.8 F (36.6 C) (Tympanic)   Resp 16   Wt 210 lb 12.8 oz (95.6 kg)   BMI 30.25 kg/m   Filed Weights   12/06/17 1359  Weight: 210 lb 12.8 oz (95.6 kg)     GENERAL: Well-nourished well-developed; Alert, [name]; good dentition  NECK: supple, [name], [name]; 1-2 bilateral lower extremity swelling. ABDOMEN:abdomen soft, [name];  LABORATORY DATA:  I have reviewed the data as listed    Component Value Date/Time   NA 134 (L) 12/06/2017 1332   NA 138 12/10/2014 1341   K 3.9 12/06/2017 1332   K 3.6 12/10/2014 1341   CL 99 (L) 12/06/2017 1332   CL 102 12/10/2014 1341   CO2 25 12/06/2017 1332   CO2 30 12/10/2014 1341   GLUCOSE 191 (H) 12/06/2017 1332   GLUCOSE 160 (H) 12/10/2014 1341   BUN 27 (H) 12/06/2017 1332   BUN 21 (H) 12/10/2014 1341    CREATININE 1.10 12/06/2017 1332   CREATININE 1.10 03/25/2015 1453   CALCIUM 9.0 12/06/2017 1332   CALCIUM 8.4 (L) 12/10/2014 1341   PROT 7.2 12/06/2017 1332   PROT 6.6 12/10/2014 1341   ALBUMIN 4.0 12/06/2017 1332   ALBUMIN 3.4 12/10/2014 1341   AST 25 12/06/2017 1332   AST 17 12/10/2014 1341   ALT 17 12/06/2017 1332   ALT 23 12/10/2014 1341   ALKPHOS 41 12/06/2017 1332   ALKPHOS 50 12/10/2014 1341   BILITOT 0.8 12/06/2017 1332   BILITOT 0.5 12/10/2014  1341   GFRNONAA >60 12/06/2017 1332   GFRNONAA >60 03/25/2015 1453   GFRAA >60 12/06/2017 1332   GFRAA >60 03/25/2015 1453    No results found for: SPEP, UPEP  Lab Results  Component Value Date   WBC 6.6 12/06/2017   NEUTROABS 5.5 12/06/2017   HGB 13.9 12/06/2017   HCT 41.7 12/06/2017   MCV 94.9 12/06/2017   PLT 145 (L) 12/06/2017      Chemistry      Component Value Date/Time   NA 134 (L) 12/06/2017 1332   NA 138 12/10/2014 1341   K 3.9 12/06/2017 1332   K 3.6 12/10/2014 1341   CL 99 (L) 12/06/2017 1332   CL 102 12/10/2014 1341   CO2 25 12/06/2017 1332   CO2 30 12/10/2014 1341   BUN 27 (H) 12/06/2017 1332   BUN 21 (H) 12/10/2014 1341   CREATININE 1.10 12/06/2017 1332   CREATININE 1.10 03/25/2015 1453      Component Value Date/Time   CALCIUM 9.0 12/06/2017 1332   CALCIUM 8.4 (L) 12/10/2014 1341   ALKPHOS 41 12/06/2017 1332   ALKPHOS 50 12/10/2014 1341   AST 25 12/06/2017 1332   AST 17 12/10/2014 1341   ALT 17 12/06/2017 1332   ALT 23 12/10/2014 1341   BILITOT 0.8 12/06/2017 1332   BILITOT 0.5 12/10/2014 1341      IMPRESSION: 1. Stable CTs of the chest and abdomen. No evidence of local recurrence or metastatic disease. 2. Stable mild dilatation of the ascending aorta and aortic atherosclerosis.   Electronically Signed   By: Richardean Sale M.D.   On: 10/21/2017 12:04  RADIOGRAPHIC STUDIES: I have personally reviewed the radiological images as listed and agreed with the findings in the  report. No results found.   ASSESSMENT & PLAN:  Cancer of hilus of left lung ([place]) Stage IV NED. Patient currently on Alimta maintenance since 2012. NOV 16th  2018- CT chest abdomen pelvis NED; No clinical evidence of progression at this time.   #  Proceed with chemo/ alimta today;  Labs today reviewed;  acceptable for treatment today.   # Right ear ache-   # Bil LE edema Grade 1-  # Follow-up with me in 3 weeks labs/chemo.    No orders of the defined types were placed in this encounter.     [name], MD 12/06/2017 2:26 PM

## 2017-12-10 ENCOUNTER — Telehealth: Payer: Self-pay | Admitting: *Deleted

## 2017-12-10 DIAGNOSIS — H9209 Otalgia, unspecified ear: Secondary | ICD-10-CM | POA: Diagnosis not present

## 2017-12-10 DIAGNOSIS — H66019 Acute suppurative otitis media with spontaneous rupture of ear drum, unspecified ear: Secondary | ICD-10-CM | POA: Diagnosis not present

## 2017-12-10 NOTE — Telephone Encounter (Signed)
Received t/c from [facility] reporting that he went back to his EENT today and that his ear infection has actually gotten worse. He was prescribed Ciprofloxacin and a new ear drop called Ciprodex to use. Patient states he began experiencing more pain in his ear and called to let his doctor know, and was seen in the clinic. He also states he wants to change his next appointment from 12/27/17 to 12/30/17 because he is going to be out of town on the originally scheduled date. Message sent to schedulers requesting the change. Will notify patient when schedule has been updated. Yolande Jolly, BSN, MHA, OCN 12/10/2017 3:39 PM

## 2017-12-12 ENCOUNTER — Telehealth: Payer: Self-pay | Admitting: *Deleted

## 2017-12-12 NOTE — Telephone Encounter (Signed)
Received t/c from Thea Silversmith questioning side effects of the oral Ciprofloxacin as well as the Ciprodex ear drops he has been prescribed. States he has experienced some burning in his chest along with some nausea and "gas" since beginning this drug. He reports feeling like he has had chemotherapy, and asks about the side effects of the medications. Reviewed side effects of both medications including GI upset that he is experiencing, and verified that he is not taking the Cipro on an empty stomach. Patient states he is not and asks if it is OK for him to take his nausea medication for this and was instructed that he can. He also reports one episode of shortness of breath yesterday, but has not experienced any other cardiac symptoms. This was transient, but patient instructed that if he experiences this again or if feels tightness or pressure like someone standing on his chest that he should seek emergency care. Also questioned whether he has had a cardiac workup and patient states he sees a cardiologist (Dr. Saralyn Pilar) annually. Patient states he thinks these side effects are manageable, but if they become worse, he will see his EENT tomorrow and get further instructions, or call him later today if symptoms do not improve. He denies taking Zantac or Tagamet recently for any of these GI symptoms. Yolande Jolly, BSN, MHA, OCN 12/12/2017 10:53 AM

## 2017-12-25 DIAGNOSIS — H9041 Sensorineural hearing loss, unilateral, right ear, with unrestricted hearing on the contralateral side: Secondary | ICD-10-CM | POA: Diagnosis not present

## 2017-12-27 ENCOUNTER — Other Ambulatory Visit: Payer: BLUE CROSS/BLUE SHIELD

## 2017-12-27 ENCOUNTER — Ambulatory Visit: Payer: BLUE CROSS/BLUE SHIELD | Admitting: Internal Medicine

## 2017-12-27 ENCOUNTER — Ambulatory Visit: Payer: BLUE CROSS/BLUE SHIELD

## 2017-12-30 ENCOUNTER — Encounter: Payer: Self-pay | Admitting: Internal Medicine

## 2017-12-30 ENCOUNTER — Inpatient Hospital Stay: Payer: BLUE CROSS/BLUE SHIELD

## 2017-12-30 ENCOUNTER — Inpatient Hospital Stay (HOSPITAL_BASED_OUTPATIENT_CLINIC_OR_DEPARTMENT_OTHER): Payer: BLUE CROSS/BLUE SHIELD | Admitting: Internal Medicine

## 2017-12-30 ENCOUNTER — Encounter: Payer: Self-pay | Admitting: *Deleted

## 2017-12-30 VITALS — BP 128/86 | HR 101 | Temp 97.5°F | Wt 215.0 lb

## 2017-12-30 DIAGNOSIS — I712 Thoracic aortic aneurysm, without rupture: Secondary | ICD-10-CM

## 2017-12-30 DIAGNOSIS — C3402 Malignant neoplasm of left main bronchus: Secondary | ICD-10-CM

## 2017-12-30 DIAGNOSIS — Z5111 Encounter for antineoplastic chemotherapy: Secondary | ICD-10-CM | POA: Diagnosis not present

## 2017-12-30 DIAGNOSIS — H9201 Otalgia, right ear: Secondary | ICD-10-CM | POA: Diagnosis not present

## 2017-12-30 DIAGNOSIS — Z006 Encounter for examination for normal comparison and control in clinical research program: Secondary | ICD-10-CM | POA: Diagnosis not present

## 2017-12-30 DIAGNOSIS — R600 Localized edema: Secondary | ICD-10-CM | POA: Diagnosis not present

## 2017-12-30 LAB — CBC WITH DIFFERENTIAL/PLATELET
Basophils Absolute: 0.1 10*3/uL (ref 0–0.1)
Basophils Relative: 1 %
EOS ABS: 0.2 10*3/uL (ref 0–0.7)
EOS PCT: 3 %
HCT: 43.1 % (ref 40.0–52.0)
Hemoglobin: 14.7 g/dL (ref 13.0–18.0)
Lymphocytes Relative: 31 %
Lymphs Abs: 1.8 10*3/uL (ref 1.0–3.6)
MCH: 31.7 pg (ref 26.0–34.0)
MCHC: 34.2 g/dL (ref 32.0–36.0)
MCV: 92.8 fL (ref 80.0–100.0)
MONO ABS: 0.6 10*3/uL (ref 0.2–1.0)
Monocytes Relative: 11 %
Neutro Abs: 3 10*3/uL (ref 1.4–6.5)
Neutrophils Relative %: 54 %
PLATELETS: 158 10*3/uL (ref 150–440)
RBC: 4.64 MIL/uL (ref 4.40–5.90)
RDW: 14.5 % (ref 11.5–14.5)
WBC: 5.6 10*3/uL (ref 3.8–10.6)

## 2017-12-30 LAB — COMPREHENSIVE METABOLIC PANEL
ALBUMIN: 4.2 g/dL (ref 3.5–5.0)
ALT: 16 U/L — AB (ref 17–63)
AST: 25 U/L (ref 15–41)
Alkaline Phosphatase: 43 U/L (ref 38–126)
Anion gap: 9 (ref 5–15)
BILIRUBIN TOTAL: 0.7 mg/dL (ref 0.3–1.2)
BUN: 22 mg/dL — AB (ref 6–20)
CHLORIDE: 101 mmol/L (ref 101–111)
CO2: 25 mmol/L (ref 22–32)
CREATININE: 1.18 mg/dL (ref 0.61–1.24)
Calcium: 9 mg/dL (ref 8.9–10.3)
GFR calc Af Amer: >60 mL/min (ref >60)
GLUCOSE: 167 mg/dL — AB (ref 65–99)
Potassium: 3.7 mmol/L (ref 3.5–5.1)
Sodium: 135 mmol/L (ref 135–145)
Total Protein: 7.3 g/dL (ref 6.5–8.1)

## 2017-12-30 LAB — MAGNESIUM: Magnesium: 2.1 mg/dL (ref 1.7–2.4)

## 2017-12-30 MED ORDER — HEPARIN SOD (PORK) LOCK FLUSH 100 UNIT/ML IV SOLN
500.0000 [IU] | Freq: Once | INTRAVENOUS | Status: DC | PRN
  Filled 2017-12-30: qty 5

## 2017-12-30 MED ORDER — SODIUM CHLORIDE 0.9 % IV SOLN
Freq: Once | INTRAVENOUS | Status: AC
  Administered 2017-12-30: 14:00:00 via INTRAVENOUS
  Filled 2017-12-30: qty 1000

## 2017-12-30 MED ORDER — CYANOCOBALAMIN 1000 MCG/ML IJ SOLN
1000.0000 ug | Freq: Once | INTRAMUSCULAR | Status: AC
  Administered 2017-12-30: 1000 ug via INTRAMUSCULAR
  Filled 2017-12-30: qty 1

## 2017-12-30 MED ORDER — SODIUM CHLORIDE 0.9 % IV SOLN
500.0000 mg/m2 | Freq: Once | INTRAVENOUS | Status: AC
  Administered 2017-12-30: 1050 mg via INTRAVENOUS
  Filled 2017-12-30: qty 42

## 2017-12-30 MED ORDER — SODIUM CHLORIDE 0.9 % IV SOLN
Freq: Once | INTRAVENOUS | Status: AC
  Administered 2017-12-30: 15:00:00 via INTRAVENOUS
  Filled 2017-12-30: qty 4

## 2017-12-30 NOTE — Progress Notes (Signed)
[place] OFFICE PROGRESS NOTE  [place] Care Team: [name], [name], stage 4 [facility])   Staging form: Lung, AJCC 7th Edition     Clinical stage from 05/24/2015: Stage IV ([place], NX, [name], MD on 05/24/2015    Oncology History   # 2012- METASTATIC ADENO CA of LEFT LUNG [acinar pattern] s/p MED LN Bx [NEG- EGFR/K-ras/ALK mutation];[name]; [name]; CT JULY 2016- NED; JAN 2018- NED  # Thoracic Aneurysm [4cm- stable]     Cancer of hilus of left lung ([place])   07/20/2016 Initial Diagnosis    Cancer of hilus of left lung ([place])       INTERVAL HISTORY:  Mitchell pleasant 57 year old [place] male [place] with above history of metastatic adenocarcinoma the lung currently on maintenance Alimta since June 2012 is here for follow-up prior to his chemotherapy.  Denies any shortness of breath or cough.  Denies any headaches.  No nausea no vomiting.  Denies any chest pain. Denies any tingling and numbness.  Leg swelling not any worse.  Earache improved.  REVIEW OF SYSTEMS:  Mitchell complete 10 point review of system is done which is negative except mentioned above/history of present illness.   PAST MEDICAL HISTORY :  Past Medical History:  Diagnosis Date  . Allergy   . Lung cancer ([place])   . Mild valvular heart disease Nov. 2014   per 2 D Echocardiogram done for persistent leg edema, monitored by cardiology  . Peripheral edema     PAST SURGICAL HISTORY :   Past Surgical History:  Procedure Laterality Date  . ELBOW SURGERY    . Left anterior thoracotomy with biopsy  March 2012  . PORTACATH PLACEMENT  March 2012  . TONSILLECTOMY      FAMILY HISTORY :   Family History  Problem Relation Age of Onset  . Leukemia Father   . Diabetes Mother     SOCIAL HISTORY:   Social History    Tobacco Use  . Smoking status: Never Smoker  . Smokeless tobacco: Never Used  Substance Use Topics  . Alcohol use: Yes    Alcohol/week: 0.0 oz    Comment: "social drinker"   . Drug use: No    ALLERGIES:  is allergic to penicillins.  MEDICATIONS:  Current Outpatient Medications  Medication Sig Dispense Refill  . folic acid (FOLVITE) 1 MG tablet TAKE 1 TABLET (1 MG TOTAL) BY MOUTH DAILY. 30 tablet 11  . furosemide (LASIX) 20 MG tablet TAKE 1 TABLET (20 MG TOTAL) BY MOUTH DAILY. ONE TABLET DAILY AS NEEDED FOR SWELLING 90 tablet 1  . meloxicam (MOBIC) 7.5 MG tablet Take 7.5 mg by mouth daily.     Marland Kitchen spironolactone (ALDACTONE) 25 MG tablet Take 1 tablet (25 mg total) by mouth daily. 90 tablet 3  . loratadine (CLARITIN) 10 MG tablet Take 1 tablet (10 mg total) by mouth daily. ([facility] not taking: Reported on 12/06/2017)    . Multiple Vitamin (MULTIVITAMIN) capsule Take 1 capsule by mouth daily. Reported on 02/24/2016    . Omega-3 Fatty Acids (FISH OIL) 1000 MG CAPS Take 1 capsule by mouth daily. Reported on 02/24/2016    . ondansetron (ZOFRAN-ODT) 4 MG disintegrating tablet Take 1 tablet (4 mg total) by mouth every 4 (four) hours as needed for nausea or vomiting. ([place] not taking:  Reported on 12/06/2017) 20 tablet 2  . predniSONE (DELTASONE) 5 MG tablet TAKE AS DIRECTED. 6 DAY TAPER. [facility] GIVEN HANDOUT IN OFFICE WITH INSTRUCTIONS.  0  . senna (SENOKOT) 8.6 MG tablet Take 1 tablet by mouth daily. Take as needed around time of chemotherapy    . tadalafil (CIALIS) 20 MG tablet Take 1 tablet by mouth every 2 days as needed. ([place] not taking: Reported on 12/30/2017) 6 tablet 11  . vitamin B-12 (CYANOCOBALAMIN) 1000 MCG tablet Take 1,000 mcg by mouth daily.     No current facility-administered medications for this visit.    Facility-Administered Medications Ordered in Other Visits  Medication Dose Route Frequency Provider Last Rate Last Dose  . heparin lock flush 100 unit/mL  500 Units  Intracatheter Once PRN [name], MD      . INV-PEMEtrexed Lilly S130 (ALIMTA) 1,050 mg in sodium chloride 0.9 % 100 mL chemo infusion  500 mg/m2 (Treatment Plan Recorded) Intravenous Once [name], MD      . ondansetron (ZOFRAN) 8 mg, dexamethasone (DECADRON) 10 mg in sodium chloride 0.9 % 50 mL IVPB   Intravenous Once Charlaine Dalton R, [name], MD   10 mL at 05/27/15 1543  . sodium chloride 0.9 % injection 10 mL  10 mL Intracatheter PRN [name], MD   10 mL at 06/17/15 1510  . sodium chloride 0.9 % injection 10 mL  10 mL Intravenous PRN [name], MD   10 mL at 07/08/15 1340    PHYSICAL EXAMINATION: ECOG PERFORMANCE STATUS: 0 - Asymptomatic  BP 128/86   Pulse (!) 101   Temp (!) 97.5 F (36.4 C) (Oral)   Wt 215 lb (97.5 kg)   SpO2 99%   BMI 30.85 kg/m   Filed Weights   12/30/17 1351  Weight: 215 lb (97.5 kg)     GENERAL: Well-nourished well-developed; Alert, [name]; good dentition  NECK: supple, [name], [name]; 1+ bilateral lower extremity swelling. ABDOMEN:abdomen soft, [name];  LABORATORY DATA:  I have reviewed the data as listed    Component Value Date/Time   NA 135 12/30/2017 1343   NA 138 12/10/2014 1341   K 3.7 12/30/2017 1343   K 3.6 12/10/2014 1341   CL 101 12/30/2017 1343   CL 102 12/10/2014 1341   CO2 25 12/30/2017 1343   CO2 30 12/10/2014 1341    GLUCOSE 167 (H) 12/30/2017 1343   GLUCOSE 160 (H) 12/10/2014 1341   BUN 22 (H) 12/30/2017 1343   BUN 21 (H) 12/10/2014 1341   CREATININE 1.18 12/30/2017 1343   CREATININE 1.10 03/25/2015 1453   CALCIUM 9.0 12/30/2017 1343   CALCIUM 8.4 (L) 12/10/2014 1341   PROT 7.3 12/30/2017 1343   PROT 6.6 12/10/2014 1341   ALBUMIN 4.2 12/30/2017 1343   ALBUMIN 3.4 12/10/2014 1341   AST 25 12/30/2017 1343   AST 17 12/10/2014 1341   ALT 16 (L) 12/30/2017  1343   ALT 23 12/10/2014 1341   ALKPHOS 43 12/30/2017 1343   ALKPHOS 50 12/10/2014 1341   BILITOT 0.7 12/30/2017 1343   BILITOT 0.5 12/10/2014 1341   GFRNONAA >60 12/30/2017 1343   GFRNONAA >60 03/25/2015 1453   GFRAA >60 12/30/2017 1343   GFRAA >60 03/25/2015 1453    No results found for: SPEP, UPEP  Lab Results  Component Value Date   WBC 5.6 12/30/2017   NEUTROABS 3.0 12/30/2017   HGB 14.7 12/30/2017   HCT 43.1 12/30/2017   MCV 92.8 12/30/2017   PLT 158 12/30/2017      Chemistry      Component Value Date/Time   NA 135 12/30/2017 1343   NA 138 12/10/2014 1341   K 3.7 12/30/2017 1343   K 3.6 12/10/2014 1341   CL 101 12/30/2017 1343   CL 102 12/10/2014 1341   CO2 25 12/30/2017 1343   CO2 30 12/10/2014 1341   BUN 22 (H) 12/30/2017 1343   BUN 21 (H) 12/10/2014 1341   CREATININE 1.18 12/30/2017 1343   CREATININE 1.10 03/25/2015 1453      Component Value Date/Time   CALCIUM 9.0 12/30/2017 1343   CALCIUM 8.4 (L) 12/10/2014 1341   ALKPHOS 43 12/30/2017 1343   ALKPHOS 50 12/10/2014 1341   AST 25 12/30/2017 1343   AST 17 12/10/2014 1341   ALT 16 (L) 12/30/2017 1343   ALT 23 12/10/2014 1341   BILITOT 0.7 12/30/2017 1343   BILITOT 0.5 12/10/2014 1341      IMPRESSION: 1. Stable CTs of the chest and abdomen. No evidence of local recurrence or metastatic disease. 2. Stable mild dilatation of the ascending aorta and aortic atherosclerosis.   Electronically Signed   By: Richardean Sale M.D.   On: 10/21/2017  12:04  RADIOGRAPHIC STUDIES: I have personally reviewed the radiological images as listed and agreed with the findings in the report. No results found.   ASSESSMENT & PLAN:  Cancer of hilus of left lung ([place]) Stage IV NED. [place] currently on Alimta maintenance since 2012. NOV 16th  2018- CT chest abdomen pelvis NED; No clinical evidence of progression at this time.   #  Proceed with chemo/ alimta today;  Labs today reviewed;  acceptable for treatment today.   # Bil LE edema Grade 1-continue diuretics.  # Follow-up with me in 3 weeks labs/chemo.    Orders Placed This Encounter  Procedures  . CBC with Differential/Platelet    Standing Status:   Future    Number of Occurrences:   1    Standing Expiration Date:   12/30/2018  . Comprehensive metabolic panel    Standing Status:   Future    Number of Occurrences:   1    Standing Expiration Date:   12/30/2018  . Magnesium    Standing Status:   Future    Number of Occurrences:   1    Standing Expiration Date:   12/30/2018      [name], MD 12/30/2017 2:35 PM

## 2017-12-30 NOTE — Progress Notes (Signed)
Mitchell [facility] returns to clinic today for consideration of cycle #615 Alimta infusion on the [employer] research study. Patient still experiencing edema in his BLE and continues to take Lasix and Spironolactone daily for the edema. He also continues to experience the odd itching in his palms and the bottoms of his feet at times, and uses OTC lotion with relief of the itching. Denies any pain anywhere currently. His VS are stable today. His weight is up a little more since last visit at 215 lbs (97.5 kg), but not enough to warrant an IP dose change. Mitchell [facility] also verifies that he is taking Folic Acid and Mobic daily as prescribed, and he will receive a vitamin B12 injection in clinic today. States his ear infection has now resolved, but his antibiotic had to be changed from [facility] to [place] before his symptoms resolved. Reported nausea and GI upset related to the Cipro and states he had to take Zofran for this with relief. Patient's glucose remains elevated again at 167 mg/dL non-fasting and he is no longer taking steroids. Platelets are back within normal range today at 158,000. CBC and chemistry levels are otherwise within acceptable parameters for patient to receive his treatment today. Patient will receive Alimta infusion as scheduled today with no dose modifications. Dr. Rogue Bussing in to examine patient and states he will sign treatment orders. Current adverse events with grade and attribution as follows:   Adverse Event Log Study/Protocol: [name] S130 Cycle: 614 Event Grade Onset Date Resolved Date Drug Name Pemetrexed Attribution Treatment Comments  Edema (mild) Grade 1 Grade 2 Grade 1 05/30/12  11/15/17  12/06/2017 11/15/17 12/06/2017  Possible  1+ edema in ble up to calf  Fatigue Grade1 02/03/16 ongoing  Possible    Tendonitis Grade 1 09/21/16 ongoing  Unrelated  Meloxicam  Hyponatremia Grade 1 10/28/17 12/30/2017  Possible  Sodium 135  Pruritis Grade 1 04/19/17 ongoing  Possible Uses generic  lotion with relief deep itching under the skin of palms & feet.   Otitis, middle ear Grade 2 12/04/2017 12/30/2017  Unrelated Omnicef+steroids Resolved with change to Cipro  Low platelet count Grade 1 12/06/2017 12/30/2017  Possible  Platelets 158,000  Yolande Jolly, BSN, MHA, OCN 12/30/2017  2:17 PM

## 2017-12-30 NOTE — Assessment & Plan Note (Addendum)
Stage IV NED. Patient currently on Alimta maintenance since 2012. NOV 16th  2018- CT chest abdomen pelvis NED; No clinical evidence of progression at this time.   #  Proceed with chemo/ alimta today;  Labs today reviewed;  acceptable for treatment today.   # Bil LE edema Grade 1-continue diuretics.  # Follow-up with me in 3 weeks labs/chemo.

## 2018-01-17 ENCOUNTER — Inpatient Hospital Stay: Payer: BLUE CROSS/BLUE SHIELD

## 2018-01-17 ENCOUNTER — Encounter: Payer: Self-pay | Admitting: *Deleted

## 2018-01-17 ENCOUNTER — Other Ambulatory Visit: Payer: Self-pay

## 2018-01-17 ENCOUNTER — Inpatient Hospital Stay: Payer: BLUE CROSS/BLUE SHIELD | Attending: Internal Medicine | Admitting: Internal Medicine

## 2018-01-17 ENCOUNTER — Encounter: Payer: Self-pay | Admitting: Internal Medicine

## 2018-01-17 VITALS — BP 135/90 | HR 60 | Temp 97.9°F | Resp 20 | Ht 70.0 in | Wt 216.0 lb

## 2018-01-17 DIAGNOSIS — Z006 Encounter for examination for normal comparison and control in clinical research program: Secondary | ICD-10-CM | POA: Diagnosis not present

## 2018-01-17 DIAGNOSIS — I700 Atherosclerosis of aorta: Secondary | ICD-10-CM | POA: Diagnosis not present

## 2018-01-17 DIAGNOSIS — C3402 Malignant neoplasm of left main bronchus: Secondary | ICD-10-CM

## 2018-01-17 DIAGNOSIS — R600 Localized edema: Secondary | ICD-10-CM | POA: Insufficient documentation

## 2018-01-17 DIAGNOSIS — Z5111 Encounter for antineoplastic chemotherapy: Secondary | ICD-10-CM | POA: Insufficient documentation

## 2018-01-17 DIAGNOSIS — I712 Thoracic aortic aneurysm, without rupture: Secondary | ICD-10-CM | POA: Diagnosis not present

## 2018-01-17 LAB — URINALYSIS, COMPLETE (UACMP) WITH MICROSCOPIC
Bilirubin Urine: NEGATIVE
Glucose, UA: NEGATIVE mg/dL
HGB URINE DIPSTICK: NEGATIVE
Ketones, ur: NEGATIVE mg/dL
Leukocytes, UA: NEGATIVE
Nitrite: NEGATIVE
Protein, ur: NEGATIVE mg/dL
RBC / HPF: NONE SEEN RBC/hpf (ref 0–5)
SQUAMOUS EPITHELIAL / LPF: NONE SEEN
Specific Gravity, Urine: 1.003 — ABNORMAL LOW (ref 1.005–1.030)
WBC, UA: NONE SEEN WBC/hpf (ref 0–5)
pH: 6 (ref 5.0–8.0)

## 2018-01-17 LAB — MAGNESIUM: MAGNESIUM: 1.9 mg/dL (ref 1.7–2.4)

## 2018-01-17 LAB — COMPREHENSIVE METABOLIC PANEL
ALBUMIN: 4.2 g/dL (ref 3.5–5.0)
ALT: 18 U/L (ref 17–63)
AST: 22 U/L (ref 15–41)
Alkaline Phosphatase: 40 U/L (ref 38–126)
Anion gap: 8 (ref 5–15)
BUN: 23 mg/dL — AB (ref 6–20)
CHLORIDE: 98 mmol/L — AB (ref 101–111)
CO2: 25 mmol/L (ref 22–32)
CREATININE: 1.01 mg/dL (ref 0.61–1.24)
Calcium: 9 mg/dL (ref 8.9–10.3)
GFR calc Af Amer: >60 mL/min (ref >60)
GLUCOSE: 119 mg/dL — AB (ref 65–99)
Potassium: 3.8 mmol/L (ref 3.5–5.1)
Sodium: 131 mmol/L — ABNORMAL LOW (ref 135–145)
Total Bilirubin: 1 mg/dL (ref 0.3–1.2)
Total Protein: 7.2 g/dL (ref 6.5–8.1)

## 2018-01-17 LAB — CBC WITH DIFFERENTIAL/PLATELET
Basophils Absolute: 0 10*3/uL (ref 0–0.1)
Basophils Relative: 0 %
EOS PCT: 3 %
Eosinophils Absolute: 0.2 10*3/uL (ref 0–0.7)
HCT: 40.4 % (ref 40.0–52.0)
Hemoglobin: 14.1 g/dL (ref 13.0–18.0)
LYMPHS ABS: 1.6 10*3/uL (ref 1.0–3.6)
LYMPHS PCT: 28 %
MCH: 32.5 pg (ref 26.0–34.0)
MCHC: 34.8 g/dL (ref 32.0–36.0)
MCV: 93.1 fL (ref 80.0–100.0)
MONO ABS: 0.5 10*3/uL (ref 0.2–1.0)
Monocytes Relative: 9 %
Neutro Abs: 3.5 10*3/uL (ref 1.4–6.5)
Neutrophils Relative %: 60 %
PLATELETS: 163 10*3/uL (ref 150–440)
RBC: 4.34 MIL/uL — AB (ref 4.40–5.90)
RDW: 14.4 % (ref 11.5–14.5)
WBC: 5.9 10*3/uL (ref 3.8–10.6)

## 2018-01-17 MED ORDER — SODIUM CHLORIDE 0.9 % IV SOLN
500.0000 mg/m2 | Freq: Once | INTRAVENOUS | Status: AC
  Administered 2018-01-17: 1050 mg via INTRAVENOUS
  Filled 2018-01-17: qty 42

## 2018-01-17 MED ORDER — HEPARIN SOD (PORK) LOCK FLUSH 100 UNIT/ML IV SOLN
INTRAVENOUS | Status: AC
  Filled 2018-01-17: qty 5

## 2018-01-17 MED ORDER — SODIUM CHLORIDE 0.9 % IV SOLN
Freq: Once | INTRAVENOUS | Status: AC
  Administered 2018-01-17: 12:00:00 via INTRAVENOUS
  Filled 2018-01-17: qty 4

## 2018-01-17 MED ORDER — HEPARIN SOD (PORK) LOCK FLUSH 100 UNIT/ML IV SOLN
500.0000 [IU] | Freq: Once | INTRAVENOUS | Status: AC
  Administered 2018-01-17: 500 [IU] via INTRAVENOUS

## 2018-01-17 MED ORDER — HEPARIN SOD (PORK) LOCK FLUSH 100 UNIT/ML IV SOLN: 500.0000 [IU] | Freq: Once | INTRAVENOUS | Status: DC | PRN

## 2018-01-17 MED ORDER — SODIUM CHLORIDE 0.9 % IV SOLN
Freq: Once | INTRAVENOUS | Status: AC
  Administered 2018-01-17: 12:00:00 via INTRAVENOUS
  Filled 2018-01-17: qty 1000

## 2018-01-17 MED ORDER — SODIUM CHLORIDE 0.9% FLUSH
10.0000 mL | INTRAVENOUS | Status: DC | PRN
  Administered 2018-01-17: 10 mL via INTRAVENOUS
  Filled 2018-01-17: qty 10

## 2018-01-17 NOTE — Assessment & Plan Note (Addendum)
Stage IV NED. Patient currently on Alimta maintenance since 2012. NOV 16th  2018- CT chest abdomen pelvis NED; No clinical evidence of progression at this time.   #  Proceed with chemo/ alimta today;  Labs today reviewed;  acceptable for treatment today.   # Bil LE edema Grade 1-continue diuretics.  # Follow-up with me in 3 weeks labs/chemo; CT prior.

## 2018-01-17 NOTE — Progress Notes (Signed)
[place] OFFICE PROGRESS NOTE  Patient Care Team: [name], [name], stage 4 [facility])   Staging form: Lung, AJCC 7th Edition     Clinical stage from 05/24/2015: Stage IV ([place], NX, [name], MD on 05/24/2015    Oncology History   # 2012- METASTATIC ADENO CA of LEFT LUNG [acinar pattern] s/p MED LN Bx [NEG- EGFR/K-ras/ALK mutation];[name]; [name]; CT JULY 2016- NED; JAN 2018- NED  # Thoracic Aneurysm [4cm- stable]     Cancer of hilus of left lung ([place])   07/20/2016 Initial Diagnosis    Cancer of hilus of left lung ([place])       INTERVAL HISTORY:  A pleasant 57 year old [place] male patient with above history of metastatic adenocarcinoma the lung currently on maintenance Alimta since June 2012 is here for follow-up prior to his chemotherapy.  Overall feels good.  Denies any shortness of breath or cough.  Denies any headaches.  No nausea no vomiting.  Denies any chest pain. Denies any tingling and numbness.   REVIEW OF SYSTEMS:  A complete 10 point review of system is done which is negative except mentioned above/history of present illness.   PAST MEDICAL HISTORY :  Past Medical History:  Diagnosis Date  . Allergy   . Lung cancer ([place])   . Mild valvular heart disease Nov. 2014   per 2 D Echocardiogram done for persistent leg edema, monitored by cardiology  . Peripheral edema     PAST SURGICAL HISTORY :   Past Surgical History:  Procedure Laterality Date  . ELBOW SURGERY    . Left anterior thoracotomy with biopsy  March 2012  . PORTACATH PLACEMENT  March 2012  . TONSILLECTOMY      FAMILY HISTORY :   Family History  Problem Relation Age of Onset  . Leukemia Father   . Diabetes Mother     SOCIAL HISTORY:   Social History   Tobacco Use  . Smoking  status: Never Smoker  . Smokeless tobacco: Never Used  Substance Use Topics  . Alcohol use: Yes    Alcohol/week: 0.0 oz    Comment: "social drinker"   . Drug use: No    ALLERGIES:  is allergic to penicillins.  MEDICATIONS:  Current Outpatient Medications  Medication Sig Dispense Refill  . folic acid (FOLVITE) 1 MG tablet TAKE 1 TABLET (1 MG TOTAL) BY MOUTH DAILY. 30 tablet 11  . furosemide (LASIX) 20 MG tablet TAKE 1 TABLET (20 MG TOTAL) BY MOUTH DAILY. ONE TABLET DAILY AS NEEDED FOR SWELLING 90 tablet 1  . meloxicam (MOBIC) 7.5 MG tablet Take 7.5 mg by mouth daily.     . Multiple Vitamin (MULTIVITAMIN) capsule Take 1 capsule by mouth daily. Reported on 02/24/2016    . Omega-3 Fatty Acids (FISH OIL) 1000 MG CAPS Take 1 capsule by mouth daily. Reported on 02/24/2016    . senna (SENOKOT) 8.6 MG tablet Take 1 tablet by mouth daily. Take as needed around time of chemotherapy    . spironolactone (ALDACTONE) 25 MG tablet Take 1 tablet (25 mg total) by mouth daily. 90 tablet 3  . vitamin B-12 (CYANOCOBALAMIN) 1000 MCG tablet Take 1,000 mcg by mouth daily.    Marland Kitchen loratadine (CLARITIN) 10 MG tablet Take 1 tablet (10 mg total) by mouth daily. (Patient not taking:  Reported on 12/06/2017)    . ondansetron (ZOFRAN-ODT) 4 MG disintegrating tablet Take 1 tablet (4 mg total) by mouth every 4 (four) hours as needed for nausea or vomiting. (Patient not taking: Reported on 12/06/2017) 20 tablet 2  . tadalafil (CIALIS) 20 MG tablet Take 1 tablet by mouth every 2 days as needed. (Patient not taking: Reported on 12/30/2017) 6 tablet 11   No current facility-administered medications for this visit.    Facility-Administered Medications Ordered in Other Visits  Medication Dose Route Frequency Provider Last Rate Last Dose  . sodium chloride 0.9 % injection 10 mL  10 mL Intracatheter PRN [name], MD   10 mL at 05/27/15 1543  . sodium chloride 0.9 % injection 10 mL  10 mL Intracatheter PRN [name], MD   10  mL at 06/17/15 1510  . sodium chloride 0.9 % injection 10 mL  10 mL Intravenous PRN [name], MD   10 mL at 07/08/15 1340    PHYSICAL EXAMINATION: ECOG PERFORMANCE STATUS: 0 - Asymptomatic  BP 135/90 (BP Location: Right Arm, Patient Position: Sitting)   Pulse 60   Temp 97.9 F (36.6 C) (Tympanic)   Resp 20   Ht '5\' 10"'  (1.778 m)   Wt 216 lb (98 kg)   BMI 30.99 kg/m   Filed Weights   01/17/18 1124  Weight: 216 lb (98 kg)     GENERAL: Well-nourished well-developed; Alert, [name]; good dentition  NECK: supple, [name], [name]; 1+ bilateral lower extremity swelling. ABDOMEN:abdomen soft, [name];  LABORATORY DATA:  I have reviewed the data as listed    Component Value Date/Time   NA 131 (L) 01/17/2018 1107   NA 138 12/10/2014 1341   K 3.8 01/17/2018 1107   K 3.6 12/10/2014 1341   CL 98 (L) 01/17/2018 1107   CL 102 12/10/2014 1341   CO2 25 01/17/2018 1107   CO2 30 12/10/2014 1341   GLUCOSE 119 (H) 01/17/2018 1107   GLUCOSE 160 (H) 12/10/2014 1341   BUN 23 (H) 01/17/2018 1107   BUN 21 (H) 12/10/2014 1341   CREATININE 1.01 01/17/2018 1107   CREATININE 1.10 03/25/2015 1453   CALCIUM 9.0 01/17/2018 1107   CALCIUM 8.4 (L) 12/10/2014 1341   PROT 7.2 01/17/2018 1107   PROT 6.6 12/10/2014 1341   ALBUMIN 4.2 01/17/2018 1107   ALBUMIN 3.4 12/10/2014 1341   AST 22 01/17/2018 1107   AST 17 12/10/2014 1341   ALT 18 01/17/2018 1107   ALT 23 12/10/2014 1341   ALKPHOS 40 01/17/2018 1107    ALKPHOS 50 12/10/2014 1341   BILITOT 1.0 01/17/2018 1107   BILITOT 0.5 12/10/2014 1341   GFRNONAA >60 01/17/2018 1107   GFRNONAA >60 03/25/2015 1453   GFRAA >60 01/17/2018 1107   GFRAA >60 03/25/2015 1453    No results found for: SPEP, UPEP  Lab Results  Component Value Date   WBC 5.9 01/17/2018   NEUTROABS 3.5 01/17/2018   HGB 14.1 01/17/2018   HCT 40.4 01/17/2018   MCV 93.1 01/17/2018  PLT 163 01/17/2018      Chemistry      Component Value Date/Time   NA 131 (L) 01/17/2018 1107   NA 138 12/10/2014 1341   K 3.8 01/17/2018 1107   K 3.6 12/10/2014 1341   CL 98 (L) 01/17/2018 1107   CL 102 12/10/2014 1341   CO2 25 01/17/2018 1107   CO2 30 12/10/2014 1341   BUN 23 (H) 01/17/2018 1107   BUN 21 (H) 12/10/2014 1341   CREATININE 1.01 01/17/2018 1107   CREATININE 1.10 03/25/2015 1453      Component Value Date/Time   CALCIUM 9.0 01/17/2018 1107   CALCIUM 8.4 (L) 12/10/2014 1341   ALKPHOS 40 01/17/2018 1107   ALKPHOS 50 12/10/2014 1341   AST 22 01/17/2018 1107   AST 17 12/10/2014 1341   ALT 18 01/17/2018 1107   ALT 23 12/10/2014 1341   BILITOT 1.0 01/17/2018 1107   BILITOT 0.5 12/10/2014 1341      IMPRESSION: 1. Stable CTs of the chest and abdomen. No evidence of local recurrence or metastatic disease. 2. Stable mild dilatation of the ascending aorta and aortic atherosclerosis.   Electronically Signed   By: Richardean Sale M.D.   On: 10/21/2017 12:04  RADIOGRAPHIC STUDIES: I have personally reviewed the radiological images as listed and agreed with the findings in the report. No results found.   ASSESSMENT & PLAN:  Cancer of hilus of left lung ([place]) Stage IV NED. Patient currently on Alimta maintenance since 2012. NOV 16th  2018- CT chest abdomen pelvis NED; No clinical evidence of progression at this time.   #  Proceed with chemo/ alimta today;  Labs today reviewed;  acceptable for treatment today.   # Bil LE edema Grade 1-continue diuretics.  #  Follow-up with me in 3 weeks labs/chemo; CT prior.     No orders of the defined types were placed in this encounter.     [name], MD 01/18/2018 4:24 AM

## 2018-01-17 NOTE — Progress Notes (Addendum)
[name] returns to clinic today for consideration of cycle #616 Alimta infusion on the [employer] research study. Patient still experiencing edema in his BLE and continues to take Lasix and Spironolactone daily for the edema. He also continues to experience the odd itching in his palms and the bottoms of his feet at times, but states this is better and continues to use OTC lotion with relief of the itching. Denies any pain anywhere currently. His VS are stable today. His weight is up even more since last visit at 216 lbs (98 kg), but still not enough to warrant an IP dose change. Mr. [name] also verifies that he is taking Folic Acid and Mobic daily as prescribed, and he receives vitamin B12 injections in clinic every 9 weeks per protocol. Denies any further nausea and GI upset since completing antibiotics last cycle. Patient's glucose has improved at 119 mg/dL non-fasting and he is no longer taking steroids. Platelets remains within normal range today at 163,000. Sodium level is a little low at 131 mmol/L. CBC and chemistry levels are otherwise within acceptable parameters for patient to receive his treatment today. Patient will receive Alimta infusion as scheduled today with no dose modifications. Dr. Rogue Bussing in to examine patient and states he will sign treatment orders. Patient is due for CT scan on 02/03/18; prior to his next clinic appointment. Current adverse events with grade and attribution as follows:   Adverse Event Log Study/Protocol: [name] S130 Cycle: 615 Event Grade Onset Date Resolved Date Drug Name Pemetrexed Attribution Treatment Comments  Edema (mild) Grade 1    12/06/2017   Possible  1+ edema in ble up to calf  Fatigue Grade1 01/14/16   Possible    Tendonitis Grade 1 09/21/16   Unrelated  Meloxicam  Hyponatremia Grade 1 01/17/18   Possible  Sodium 135  Pruritis Grade 1 04/19/17   Possible Uses generic lotion with relief deep itching under the skin of palms & feet.   Yolande Jolly, BSN, MHA, OCN 01/17/2018  2:17 PM

## 2018-01-30 ENCOUNTER — Other Ambulatory Visit: Payer: Self-pay | Admitting: Internal Medicine

## 2018-02-03 ENCOUNTER — Ambulatory Visit
Admission: RE | Admit: 2018-02-03 | Discharge: 2018-02-03 | Disposition: A | Discharge status: Home / Self Care | Payer: BLUE CROSS/BLUE SHIELD | Source: Ambulatory Visit | Attending: Internal Medicine | Admitting: Internal Medicine

## 2018-02-03 DIAGNOSIS — C3490 Malignant neoplasm of unspecified part of unspecified bronchus or lung: Secondary | ICD-10-CM | POA: Diagnosis not present

## 2018-02-03 DIAGNOSIS — I700 Atherosclerosis of aorta: Secondary | ICD-10-CM | POA: Insufficient documentation

## 2018-02-03 DIAGNOSIS — Z85118 Personal history of other malignant neoplasm of bronchus and lung: Secondary | ICD-10-CM | POA: Insufficient documentation

## 2018-02-03 DIAGNOSIS — I712 Thoracic aortic aneurysm, without rupture: Secondary | ICD-10-CM | POA: Insufficient documentation

## 2018-02-03 DIAGNOSIS — C3402 Malignant neoplasm of left main bronchus: Secondary | ICD-10-CM

## 2018-02-03 MED ORDER — IOPAMIDOL (ISOVUE-300) INJECTION 61%
85.0000 mL | Freq: Once | INTRAVENOUS | Status: AC | PRN
  Administered 2018-02-03: 85 mL via INTRAVENOUS

## 2018-02-06 ENCOUNTER — Encounter: Payer: Self-pay | Admitting: Internal Medicine

## 2018-02-06 ENCOUNTER — Inpatient Hospital Stay: Payer: BLUE CROSS/BLUE SHIELD | Attending: Internal Medicine

## 2018-02-06 ENCOUNTER — Inpatient Hospital Stay (HOSPITAL_BASED_OUTPATIENT_CLINIC_OR_DEPARTMENT_OTHER): Payer: BLUE CROSS/BLUE SHIELD | Admitting: Internal Medicine

## 2018-02-06 ENCOUNTER — Encounter: Payer: Self-pay | Admitting: *Deleted

## 2018-02-06 ENCOUNTER — Inpatient Hospital Stay: Payer: BLUE CROSS/BLUE SHIELD

## 2018-02-06 VITALS — BP 121/79 | HR 72 | Temp 97.2°F | Resp 16 | Wt 214.2 lb

## 2018-02-06 DIAGNOSIS — C3402 Malignant neoplasm of left main bronchus: Secondary | ICD-10-CM

## 2018-02-06 DIAGNOSIS — Z006 Encounter for examination for normal comparison and control in clinical research program: Secondary | ICD-10-CM | POA: Insufficient documentation

## 2018-02-06 DIAGNOSIS — I712 Thoracic aortic aneurysm, without rupture: Secondary | ICD-10-CM | POA: Diagnosis not present

## 2018-02-06 DIAGNOSIS — R600 Localized edema: Secondary | ICD-10-CM

## 2018-02-06 DIAGNOSIS — Z5111 Encounter for antineoplastic chemotherapy: Secondary | ICD-10-CM | POA: Insufficient documentation

## 2018-02-06 DIAGNOSIS — C3490 Malignant neoplasm of unspecified part of unspecified bronchus or lung: Secondary | ICD-10-CM

## 2018-02-06 LAB — COMPREHENSIVE METABOLIC PANEL
ALK PHOS: 43 U/L (ref 38–126)
ALT: 18 U/L (ref 17–63)
AST: 24 U/L (ref 15–41)
Albumin: 3.9 g/dL (ref 3.5–5.0)
Anion gap: 9 (ref 5–15)
BILIRUBIN TOTAL: 0.7 mg/dL (ref 0.3–1.2)
BUN: 19 mg/dL (ref 6–20)
CO2: 23 mmol/L (ref 22–32)
Calcium: 8.8 mg/dL — ABNORMAL LOW (ref 8.9–10.3)
Chloride: 100 mmol/L — ABNORMAL LOW (ref 101–111)
Creatinine, Ser: 1.08 mg/dL (ref 0.61–1.24)
GFR calc Af Amer: >60 mL/min (ref >60)
Glucose, Bld: 120 mg/dL — ABNORMAL HIGH (ref 65–99)
Potassium: 3.8 mmol/L (ref 3.5–5.1)
Sodium: 132 mmol/L — ABNORMAL LOW (ref 135–145)
TOTAL PROTEIN: 7 g/dL (ref 6.5–8.1)

## 2018-02-06 LAB — CBC WITH DIFFERENTIAL/PLATELET
BASOS ABS: 0 10*3/uL (ref 0–0.1)
Basophils Relative: 1 %
EOS ABS: 0.2 10*3/uL (ref 0–0.7)
EOS PCT: 4 %
HCT: 38.7 % — ABNORMAL LOW (ref 40.0–52.0)
Hemoglobin: 13.5 g/dL (ref 13.0–18.0)
Lymphocytes Relative: 34 %
Lymphs Abs: 1.4 10*3/uL (ref 1.0–3.6)
MCH: 33.2 pg (ref 26.0–34.0)
MCHC: 34.9 g/dL (ref 32.0–36.0)
MCV: 95.1 fL (ref 80.0–100.0)
MONO ABS: 0.4 10*3/uL (ref 0.2–1.0)
Monocytes Relative: 11 %
Neutro Abs: 2 10*3/uL (ref 1.4–6.5)
Neutrophils Relative %: 50 %
PLATELETS: 153 10*3/uL (ref 150–440)
RBC: 4.07 MIL/uL — AB (ref 4.40–5.90)
RDW: 13.9 % (ref 11.5–14.5)
WBC: 4 10*3/uL (ref 3.8–10.6)

## 2018-02-06 MED ORDER — SODIUM CHLORIDE 0.9 % IV SOLN
Freq: Once | INTRAVENOUS | Status: AC
  Administered 2018-02-06: 10:00:00 via INTRAVENOUS
  Filled 2018-02-06: qty 1000

## 2018-02-06 MED ORDER — DEXAMETHASONE SODIUM PHOSPHATE 100 MG/10ML IJ SOLN
Freq: Once | INTRAMUSCULAR | Status: AC
  Administered 2018-02-06: 11:00:00 via INTRAVENOUS
  Filled 2018-02-06: qty 4

## 2018-02-06 MED ORDER — HEPARIN SOD (PORK) LOCK FLUSH 100 UNIT/ML IV SOLN
500.0000 [IU] | Freq: Once | INTRAVENOUS | Status: AC | PRN
  Administered 2018-02-06: 500 [IU]
  Filled 2018-02-06: qty 5

## 2018-02-06 MED ORDER — SODIUM CHLORIDE 0.9 % IV SOLN
500.0000 mg/m2 | Freq: Once | INTRAVENOUS | Status: AC
  Administered 2018-02-06: 1050 mg via INTRAVENOUS
  Filled 2018-02-06: qty 42

## 2018-02-06 NOTE — Progress Notes (Addendum)
Mitchell Mosley returns to clinic today for consideration of cycle #617 Alimta infusion on the [employer] research study. Patient still experiencing edema in his BLE and continues to take Lasix and Spironolactone daily for the edema. He also continues to experience the odd itching in his palms and the bottoms of his feet at times, and now reports the same type of "deep itching" in his right upper, inner arm. States he experienced itching in this same place when he first started on study 6 years ago and Dr. Ma Hillock told him to use some OTC cortisone cream. Reports that was helpful, but states he has not tried it yet. Patient also points out a small dark spot just below his port-a-cath site, which Dr. Rogue Bussing examined and states is just an area of hyperpigmentation.Denies any pain anywhere currently. His VS are stable today. His weight is stable since last visit at 214 lbs, 3 oz (97.16 kg). Mitchell Mosley also verifies that he is taking Folic Acid and Mobic daily as prescribed, and he receives vitamin B12 injections in clinic every 9 weeks per protocol, and that will be due at his next clinic visit in 3 weeks. Patient's glucose level 120 mg/dL non-fasting. Platelets remains within normal range today at 153,000. Sodium level remains a little low at 132 mmol/L. CBC and chemistry levels are otherwise within acceptable parameters for patient to receive his treatment today. Patient will receive Alimta infusion as scheduled today with no dose modifications. Dr. Rogue Bussing in to examine patient and review results of his CT scan, which was performed on 02/03/18. Patient remains completely disease free per CT and will continue maintenance Alimta on the S130 protocol. Current adverse events with grade and attribution as follows:   Adverse Event Log Study/Protocol: [name] S130 Cycle: 616 Event Grade Onset Date Resolved Date Drug Name Pemetrexed Attribution Treatment Comments  Edema (mild) Grade 1    12/06/2017   Possible  1+  edema in ble up to calf  Fatigue Grade1 01/14/16   Possible    Tendonitis Grade 1 09/21/16   Unrelated  Meloxicam  Hyponatremia Grade 1 01/17/18   Possible  Sodium 135  Pruritis Grade 1 04/19/17   Possible Uses generic lotion with relief deep itching under the skin of palms & feet, new in right upper arm   Abrasions on Rt knuckles Grade 1 02/03/18   Unrelated  Pt reports fall while walking his dogs  Yolande Jolly, BSN, [place], OCN 02/06/2018  10:17 AM

## 2018-02-06 NOTE — Assessment & Plan Note (Addendum)
Stage IV NED. Patient currently on Alimta maintenance since 2012. MARCH 2019  2018- CT chest abdomen pelvis NED; No clinical evidence of progression at this time.   #  Proceed with chemo/ alimta today;  Labs today reviewed;  acceptable for treatment today.   # Bil LE edema Grade 1-continue diuretics.  # Thoracic aneurysm- stable.  Continue monitoring on surveillance scans.  # Follow-up with me in 3 weeks labs/chemo.

## 2018-02-06 NOTE — Progress Notes (Signed)
[place] OFFICE PROGRESS NOTE  Patient Care Team: [name], [name], stage 4 [facility])   Staging form: Lung, AJCC 7th Edition     Clinical stage from 05/24/2015: Stage IV ([place], NX, [name], MD on 05/24/2015    Oncology History   # 2012- METASTATIC ADENO CA of LEFT LUNG [acinar pattern] s/p MED LN Bx [NEG- EGFR/K-ras/ALK mutation];[name]; [name]; CT JULY 2016- NED; JAN 2018- NED  # Thoracic Aneurysm [4cm- stable]     Cancer of hilus of left lung ([place])   07/20/2016 Initial Diagnosis    Cancer of hilus of left lung ([place])       INTERVAL HISTORY:  A pleasant 57 year old [place] male patient with above history of metastatic adenocarcinoma the lung currently on maintenance Alimta since June 2012 is here for follow-up prior to his chemotherapy/results of the restaging CAT scan.   Denies any shortness of breath or cough.  Denies any headaches.  No nausea no vomiting.  Denies any chest pain. Denies any tingling and numbness.   REVIEW OF SYSTEMS:  A complete 10 point review of system is done which is negative except mentioned above/history of present illness.   PAST MEDICAL HISTORY :  Past Medical History:  Diagnosis Date  . Allergy   . Lung cancer ([place])   . Mild valvular heart disease Nov. 2014   per 2 D Echocardiogram done for persistent leg edema, monitored by cardiology  . Peripheral edema     PAST SURGICAL HISTORY :   Past Surgical History:  Procedure Laterality Date  . ELBOW SURGERY    . Left anterior thoracotomy with biopsy  March 2012  . PORTACATH PLACEMENT  March 2012  . TONSILLECTOMY      FAMILY HISTORY :   Family History  Problem Relation Age of Onset  . Leukemia Father   . Diabetes Mother     SOCIAL HISTORY:   Social History   Tobacco Use   . Smoking status: Never Smoker  . Smokeless tobacco: Never Used  Substance Use Topics  . Alcohol use: Yes    Alcohol/week: 0.0 oz    Comment: "social drinker"   . Drug use: No    ALLERGIES:  is allergic to penicillins.  MEDICATIONS:  Current Outpatient Medications  Medication Sig Dispense Refill  . folic acid (FOLVITE) 1 MG tablet TAKE 1 TABLET (1 MG TOTAL) BY MOUTH DAILY. 30 tablet 11  . furosemide (LASIX) 20 MG tablet TAKE 1 TABLET (20 MG TOTAL) BY MOUTH DAILY. ONE TABLET DAILY AS NEEDED FOR SWELLING 90 tablet 1  . loratadine (CLARITIN) 10 MG tablet Take 1 tablet (10 mg total) by mouth daily.    . meloxicam (MOBIC) 7.5 MG tablet Take 7.5 mg by mouth daily.     . Multiple Vitamin (MULTIVITAMIN) capsule Take 1 capsule by mouth daily. Reported on 02/24/2016    . Omega-3 Fatty Acids (FISH OIL) 1000 MG CAPS Take 1 capsule by mouth daily. Reported on 02/24/2016    . ondansetron (ZOFRAN-ODT) 4 MG disintegrating tablet Take 1 tablet (4 mg total) by mouth every 4 (four) hours as needed for nausea or vomiting. 20 tablet 2  . senna (SENOKOT) 8.6 MG tablet Take 1 tablet by mouth daily. Take as needed around time of chemotherapy    . spironolactone (ALDACTONE) 25  MG tablet Take 1 tablet (25 mg total) by mouth daily. 90 tablet 3  . tadalafil (CIALIS) 20 MG tablet Take 1 tablet by mouth every 2 days as needed. 6 tablet 11  . vitamin B-12 (CYANOCOBALAMIN) 1000 MCG tablet Take 1,000 mcg by mouth daily.     No current facility-administered medications for this visit.    Facility-Administered Medications Ordered in Other Visits  Medication Dose Route Frequency Provider Last Rate Last Dose  . sodium chloride 0.9 % injection 10 mL  10 mL Intracatheter PRN [name], MD   10 mL at 05/27/15 1543  . sodium chloride 0.9 % injection 10 mL  10 mL Intracatheter PRN [name], MD   10 mL at 06/17/15 1510  . sodium chloride 0.9 % injection 10 mL  10 mL Intravenous PRN [name], MD   10 mL at  07/08/15 1340    PHYSICAL EXAMINATION: ECOG PERFORMANCE STATUS: 0 - Asymptomatic  BP 121/79 (BP Location: Right Arm, Patient Position: Sitting)   Pulse 72   Temp (!) 97.2 F (36.2 C) (Tympanic)   Resp 16   Wt 214 lb 3.2 oz (97.2 kg)   BMI 30.73 kg/m   Filed Weights   02/06/18 0913 02/06/18 0915  Weight: 214 lb 3.2 oz (97.2 kg) 214 lb 3.2 oz (97.2 kg)     GENERAL: Well-nourished well-developed; Alert, [name]; good dentition  NECK: supple, [name], [name]; 1+ bilateral lower extremity swelling. ABDOMEN:abdomen soft, [name];  LABORATORY DATA:  I have reviewed the data as listed    Component Value Date/Time   NA 132 (L) 02/06/2018 0859   NA 138 12/10/2014 1341   K 3.8 02/06/2018 0859   K 3.6 12/10/2014 1341   CL 100 (L) 02/06/2018 0859   CL 102 12/10/2014 1341   CO2 23 02/06/2018 0859   CO2 30 12/10/2014 1341   GLUCOSE 120 (H) 02/06/2018 0859   GLUCOSE 160 (H) 12/10/2014 1341   BUN 19 02/06/2018 0859   BUN 21 (H) 12/10/2014 1341   CREATININE 1.08 02/06/2018 0859   CREATININE 1.10 03/25/2015 1453   CALCIUM 8.8 (L) 02/06/2018 0859   CALCIUM 8.4 (L) 12/10/2014 1341   PROT 7.0 02/06/2018 0859   PROT 6.6 12/10/2014 1341   ALBUMIN 3.9 02/06/2018 0859   ALBUMIN 3.4 12/10/2014 1341   AST 24 02/06/2018 0859   AST 17 12/10/2014 1341   ALT 18 02/06/2018 0859   ALT 23 12/10/2014 1341   ALKPHOS 43 02/06/2018 0859   ALKPHOS 50 12/10/2014 1341   BILITOT 0.7 02/06/2018 0859   BILITOT 0.5  12/10/2014 1341   GFRNONAA >60 02/06/2018 0859   GFRNONAA >60 03/25/2015 1453   GFRAA >60 02/06/2018 0859   GFRAA >60 03/25/2015 1453    No results found for: SPEP, UPEP  Lab Results  Component Value Date   WBC 4.0 02/06/2018   NEUTROABS 2.0 02/06/2018   HGB 13.5 02/06/2018   HCT 38.7 (L) 02/06/2018   MCV 95.1 02/06/2018   PLT 153 02/06/2018  Chemistry      Component Value Date/Time   NA 132 (L) 02/06/2018 0859   NA 138 12/10/2014 1341   K 3.8 02/06/2018 0859   K 3.6 12/10/2014 1341   CL 100 (L) 02/06/2018 0859   CL 102 12/10/2014 1341   CO2 23 02/06/2018 0859   CO2 30 12/10/2014 1341   BUN 19 02/06/2018 0859   BUN 21 (H) 12/10/2014 1341   CREATININE 1.08 02/06/2018 0859   CREATININE 1.10 03/25/2015 1453      Component Value Date/Time   CALCIUM 8.8 (L) 02/06/2018 0859   CALCIUM 8.4 (L) 12/10/2014 1341   ALKPHOS 43 02/06/2018 0859   ALKPHOS 50 12/10/2014 1341   AST 24 02/06/2018 0859   AST 17 12/10/2014 1341   ALT 18 02/06/2018 0859   ALT 23 12/10/2014 1341   BILITOT 0.7 02/06/2018 0859   BILITOT 0.5 12/10/2014 1341      IMPRESSION: 1. Stable CTs of the chest and abdomen. No evidence of local recurrence or metastatic disease. 2. Stable mild dilatation of the ascending aorta and aortic atherosclerosis.   Electronically Signed   By: Richardean Sale M.D.   On: 10/21/2017 12:04  RADIOGRAPHIC STUDIES: I have personally reviewed the radiological images as listed and agreed with the findings in the report. No results found.   ASSESSMENT & PLAN:  Cancer of hilus of left lung ([place]) Stage IV NED. Patient currently on Alimta maintenance since 2012. MARCH 2019  2018- CT chest abdomen pelvis NED; No clinical evidence of progression at this time.   #  Proceed with chemo/ alimta today;  Labs today reviewed;  acceptable for treatment today.   # Bil LE edema Grade 1-continue diuretics.  # thoracic aneurysm- stable.   # Follow-up with me in 3 weeks  labs/chemo.   No orders of the defined types were placed in this encounter.     [name], MD 02/18/2018 3:29 PM

## 2018-02-27 ENCOUNTER — Other Ambulatory Visit: Payer: Self-pay | Admitting: *Deleted

## 2018-02-27 DIAGNOSIS — C3402 Malignant neoplasm of left main bronchus: Secondary | ICD-10-CM

## 2018-02-28 ENCOUNTER — Inpatient Hospital Stay: Payer: BLUE CROSS/BLUE SHIELD

## 2018-02-28 ENCOUNTER — Other Ambulatory Visit: Payer: Self-pay

## 2018-02-28 ENCOUNTER — Encounter: Payer: Self-pay | Admitting: *Deleted

## 2018-02-28 ENCOUNTER — Encounter: Payer: Self-pay | Admitting: Internal Medicine

## 2018-02-28 ENCOUNTER — Inpatient Hospital Stay (HOSPITAL_BASED_OUTPATIENT_CLINIC_OR_DEPARTMENT_OTHER): Payer: BLUE CROSS/BLUE SHIELD | Admitting: Internal Medicine

## 2018-02-28 VITALS — BP 123/81 | HR 82 | Temp 97.9°F | Resp 20 | Ht 70.0 in | Wt 217.0 lb

## 2018-02-28 DIAGNOSIS — Z006 Encounter for examination for normal comparison and control in clinical research program: Secondary | ICD-10-CM | POA: Diagnosis not present

## 2018-02-28 DIAGNOSIS — C3402 Malignant neoplasm of left main bronchus: Secondary | ICD-10-CM

## 2018-02-28 DIAGNOSIS — D696 Thrombocytopenia, unspecified: Secondary | ICD-10-CM

## 2018-02-28 DIAGNOSIS — I712 Thoracic aortic aneurysm, without rupture: Secondary | ICD-10-CM | POA: Diagnosis not present

## 2018-02-28 DIAGNOSIS — Z5111 Encounter for antineoplastic chemotherapy: Secondary | ICD-10-CM | POA: Diagnosis not present

## 2018-02-28 DIAGNOSIS — R600 Localized edema: Secondary | ICD-10-CM | POA: Diagnosis not present

## 2018-02-28 LAB — CBC WITH DIFFERENTIAL/PLATELET
Basophils Absolute: 0.1 K/uL (ref 0–0.1)
Basophils Relative: 1 %
Eosinophils Absolute: 0.1 K/uL (ref 0–0.7)
Eosinophils Relative: 3 %
HCT: 39.5 % — ABNORMAL LOW (ref 40.0–52.0)
Hemoglobin: 13.7 g/dL (ref 13.0–18.0)
Lymphocytes Relative: 34 %
Lymphs Abs: 1.6 K/uL (ref 1.0–3.6)
MCH: 32.6 pg (ref 26.0–34.0)
MCHC: 34.6 g/dL (ref 32.0–36.0)
MCV: 94.4 fL (ref 80.0–100.0)
Monocytes Absolute: 0.5 K/uL (ref 0.2–1.0)
Monocytes Relative: 10 %
Neutro Abs: 2.4 K/uL (ref 1.4–6.5)
Neutrophils Relative %: 52 %
Platelets: 169 K/uL (ref 150–440)
RBC: 4.19 MIL/uL — ABNORMAL LOW (ref 4.40–5.90)
RDW: 13.4 % (ref 11.5–14.5)
WBC: 4.7 K/uL (ref 3.8–10.6)

## 2018-02-28 LAB — COMPREHENSIVE METABOLIC PANEL
ALBUMIN: 4 g/dL (ref 3.5–5.0)
ALT: 17 U/L (ref 17–63)
ANION GAP: 8 (ref 5–15)
AST: 23 U/L (ref 15–41)
Alkaline Phosphatase: 42 U/L (ref 38–126)
BILIRUBIN TOTAL: 0.8 mg/dL (ref 0.3–1.2)
BUN: 26 mg/dL — ABNORMAL HIGH (ref 6–20)
CO2: 26 mmol/L (ref 22–32)
Calcium: 8.9 mg/dL (ref 8.9–10.3)
Chloride: 97 mmol/L — ABNORMAL LOW (ref 101–111)
Creatinine, Ser: 1.03 mg/dL (ref 0.61–1.24)
GFR calc Af Amer: >60 mL/min (ref >60)
GFR calc non Af Amer: >60 mL/min (ref >60)
GLUCOSE: 114 mg/dL — AB (ref 65–99)
POTASSIUM: 3.7 mmol/L (ref 3.5–5.1)
SODIUM: 131 mmol/L — AB (ref 135–145)
TOTAL PROTEIN: 7 g/dL (ref 6.5–8.1)

## 2018-02-28 MED ORDER — SODIUM CHLORIDE 0.9 % IV SOLN
Freq: Once | INTRAVENOUS | Status: AC
  Administered 2018-02-28: 14:00:00 via INTRAVENOUS
  Filled 2018-02-28: qty 1000

## 2018-02-28 MED ORDER — HEPARIN SOD (PORK) LOCK FLUSH 100 UNIT/ML IV SOLN
500.0000 [IU] | Freq: Once | INTRAVENOUS | Status: AC | PRN
  Administered 2018-02-28: 500 [IU]
  Filled 2018-02-28: qty 5

## 2018-02-28 MED ORDER — DEXAMETHASONE SODIUM PHOSPHATE 100 MG/10ML IJ SOLN
Freq: Once | INTRAMUSCULAR | Status: AC
  Administered 2018-02-28: 14:00:00 via INTRAVENOUS
  Filled 2018-02-28: qty 4

## 2018-02-28 MED ORDER — SODIUM CHLORIDE 0.9 % IJ SOLN
10.0000 mL | INTRAMUSCULAR | Status: DC | PRN
  Administered 2018-02-28: 10 mL
  Filled 2018-02-28: qty 10

## 2018-02-28 MED ORDER — SODIUM CHLORIDE 0.9 % IV SOLN
500.0000 mg/m2 | Freq: Once | INTRAVENOUS | Status: AC
  Administered 2018-02-28: 1050 mg via INTRAVENOUS
  Filled 2018-02-28: qty 42

## 2018-02-28 MED ORDER — CYANOCOBALAMIN 1000 MCG/ML IJ SOLN
1000.0000 ug | Freq: Once | INTRAMUSCULAR | Status: AC
  Administered 2018-02-28: 1000 ug via INTRAMUSCULAR
  Filled 2018-02-28: qty 1

## 2018-02-28 NOTE — Assessment & Plan Note (Addendum)
Stage IV NED. Patient currently on Alimta maintenance since 2012. MARCH 2019  2018- CT chest abdomen pelvis NED; No clinical evidence of progression at this time.   #  Proceed with chemo/ alimta today;  Labs today reviewed;  acceptable for treatment today.   # Bil LE edema Grade 1-continue diuretics.  # Follow-up with me in 3 weeks labs/chemo.  [thursday]

## 2018-02-28 NOTE — Progress Notes (Signed)
[place] OFFICE PROGRESS NOTE  Patient Care Team: [name], [name], stage 4 [facility])   Staging form: Lung, AJCC 7th Edition     Clinical stage from 05/24/2015: Stage IV ([place], NX, [name], MD on 05/24/2015    Oncology History   # 2012- METASTATIC ADENO CA of LEFT LUNG [acinar pattern] s/p MED LN Bx [NEG- EGFR/K-ras/ALK mutation];[name]; [name]; CT JULY 2016- NED; JAN 2018- NED  # Thoracic Aneurysm [4cm- stable]     Cancer of hilus of left lung ([place])   07/20/2016 Initial Diagnosis    Cancer of hilus of left lung ([place])       INTERVAL HISTORY:  A pleasant 57 year old [place] male patient with above history of metastatic adenocarcinoma the lung currently on maintenance Alimta since June 2012 is here for follow-up prior to his chemotherapy.   Denies any nausea vomiting.  Denies headache. Denies any shortness of breath or cough.  denies any chest pain. Denies any tingling and numbness. Complains of mild fatigue after infusion which overall resolved.  REVIEW OF SYSTEMS:  A complete 10 point review of system is done which is negative except mentioned above/history of present illness.   PAST MEDICAL HISTORY :  Past Medical History:  Diagnosis Date  . Allergy   . Lung cancer ([place])   . Mild valvular heart disease Nov. 2014   per 2 D Echocardiogram done for persistent leg edema, monitored by cardiology  . Peripheral edema     PAST SURGICAL HISTORY :   Past Surgical History:  Procedure Laterality Date  . ELBOW SURGERY    . Left anterior thoracotomy with biopsy  March 2012  . PORTACATH PLACEMENT  March 2012  . TONSILLECTOMY      FAMILY HISTORY :   Family History  Problem Relation Age of Onset  . Leukemia Father   . Diabetes Mother     SOCIAL HISTORY:    Social History   Tobacco Use  . Smoking status: Never Smoker  . Smokeless tobacco: Never Used  Substance Use Topics  . Alcohol use: Yes    Alcohol/week: 0.0 oz    Comment: "social drinker"   . Drug use: No    ALLERGIES:  is allergic to penicillins.  MEDICATIONS:  Current Outpatient Medications  Medication Sig Dispense Refill  . folic acid (FOLVITE) 1 MG tablet TAKE 1 TABLET (1 MG TOTAL) BY MOUTH DAILY. 30 tablet 11  . furosemide (LASIX) 20 MG tablet TAKE 1 TABLET (20 MG TOTAL) BY MOUTH DAILY. ONE TABLET DAILY AS NEEDED FOR SWELLING 90 tablet 1  . loratadine (CLARITIN) 10 MG tablet Take 1 tablet (10 mg total) by mouth daily.    . meloxicam (MOBIC) 7.5 MG tablet Take 7.5 mg by mouth daily.     Marland Kitchen spironolactone (ALDACTONE) 25 MG tablet Take 1 tablet (25 mg total) by mouth daily. 90 tablet 3  . Multiple Vitamin (MULTIVITAMIN) capsule Take 1 capsule by mouth daily. Reported on 02/24/2016    . Omega-3 Fatty Acids (FISH OIL) 1000 MG CAPS Take 1 capsule by mouth daily. Reported on 02/24/2016    . ondansetron (ZOFRAN-ODT) 4 MG disintegrating tablet Take 1 tablet (4 mg total) by mouth every 4 (four) hours as needed for nausea or vomiting. (Patient not taking: Reported on 02/28/2018) 20 tablet 2  .  senna (SENOKOT) 8.6 MG tablet Take 1 tablet by mouth daily. Take as needed around time of chemotherapy    . tadalafil (CIALIS) 20 MG tablet Take 1 tablet by mouth every 2 days as needed. (Patient not taking: Reported on 02/28/2018) 6 tablet 11  . vitamin B-12 (CYANOCOBALAMIN) 1000 MCG tablet Take 1,000 mcg by mouth daily.     No current facility-administered medications for this visit.    Facility-Administered Medications Ordered in Other Visits  Medication Dose Route Frequency Provider Last Rate Last Dose  . cyanocobalamin ((VITAMIN B-12)) injection 1,000 mcg  1,000 mcg Intramuscular Once Charlaine Dalton R, MD      . heparin lock flush 100 unit/mL  500 Units Intracatheter Once PRN [name], MD      . INV-PEMEtrexed Lilly S130 (ALIMTA) 1,050 mg in sodium chloride 0.9 % 100 mL chemo infusion  500 mg/m2 (Treatment Plan Recorded) Intravenous Once [name], MD      . ondansetron (ZOFRAN) 8 mg, dexamethasone (DECADRON) 10 mg in sodium chloride 0.9 % 50 mL IVPB   Intravenous Once Charlaine Dalton R, [name], MD   10 mL at 05/27/15 1543  . sodium chloride 0.9 % injection 10 mL  10 mL Intracatheter PRN [name], MD   10 mL at 06/17/15 1510  . sodium chloride 0.9 % injection 10 mL  10 mL Intravenous PRN Choksi, Delorise Shiner, MD   10 mL at 07/08/15 1340  . sodium chloride 0.9 % injection 10 mL  10 mL Intracatheter PRN [name], MD   10 mL at 02/28/18 1142    PHYSICAL EXAMINATION: ECOG PERFORMANCE STATUS: 0 - Asymptomatic  BP 123/81 (Patient Position: Sitting)   Pulse 82   Temp 97.9 F (36.6 C) (Oral)   Resp 20   Ht _0  (1.778 m)   Wt 217 lb (98.4 kg)   BMI 31.14 kg/m   Filed Weights   02/28/18 1340  Weight: 217 lb (98.4 kg)     GENERAL: Well-nourished well-developed; Alert, [name]; good dentition  NECK: supple, [name], [name]; 1+ bilateral lower extremity swelling. ABDOMEN:abdomen soft, [name];  LABORATORY DATA:  I have reviewed the data as listed    Component Value Date/Time   NA 131 (L) 02/28/2018 1142   NA 138 12/10/2014 1341   K 3.7 02/28/2018 1142   K 3.6  12/10/2014 1341   CL 97 (L) 02/28/2018 1142   CL 102 12/10/2014 1341   CO2 26 02/28/2018 1142   CO2 30 12/10/2014 1341   GLUCOSE 114 (H) 02/28/2018 1142   GLUCOSE 160 (H) 12/10/2014 1341   BUN 26 (H) 02/28/2018 1142   BUN 21 (H) 12/10/2014 1341   CREATININE 1.03 02/28/2018 1142   CREATININE 1.10 03/25/2015 1453   CALCIUM 8.9 02/28/2018 1142   CALCIUM 8.4 (L) 12/10/2014 1341   PROT 7.0 02/28/2018 1142   PROT 6.6 12/10/2014 1341   ALBUMIN 4.0  02/28/2018 1142   ALBUMIN 3.4 12/10/2014 1341   AST 23 02/28/2018 1142   AST 17 12/10/2014 1341   ALT 17 02/28/2018 1142   ALT 23 12/10/2014 1341   ALKPHOS 42 02/28/2018 1142   ALKPHOS 50 12/10/2014 1341   BILITOT 0.8 02/28/2018 1142   BILITOT 0.5 12/10/2014 1341   GFRNONAA >60 02/28/2018 1142   GFRNONAA >60 03/25/2015 1453   GFRAA >60 02/28/2018 1142   GFRAA >60 03/25/2015 1453    No results found for: SPEP, UPEP  Lab Results  Component Value Date   WBC 4.7 02/28/2018   NEUTROABS 2.4 02/28/2018   HGB 13.7 02/28/2018   HCT 39.5 (L) 02/28/2018   MCV 94.4 02/28/2018   PLT 169 02/28/2018      Chemistry      Component Value Date/Time   NA 131 (L) 02/28/2018 1142   NA 138 12/10/2014 1341   K 3.7 02/28/2018 1142   K 3.6 12/10/2014 1341   CL 97 (L) 02/28/2018 1142   CL 102 12/10/2014 1341   CO2 26 02/28/2018 1142   CO2 30 12/10/2014 1341   BUN 26 (H) 02/28/2018 1142   BUN 21 (H) 12/10/2014 1341   CREATININE 1.03 02/28/2018 1142   CREATININE 1.10 03/25/2015 1453      Component Value Date/Time   CALCIUM 8.9 02/28/2018 1142   CALCIUM 8.4 (L) 12/10/2014 1341   ALKPHOS 42 02/28/2018 1142   ALKPHOS 50 12/10/2014 1341   AST 23 02/28/2018 1142   AST 17 12/10/2014 1341   ALT 17 02/28/2018 1142   ALT 23 12/10/2014 1341   BILITOT 0.8 02/28/2018 1142   BILITOT 0.5 12/10/2014 1341      IMPRESSION: 1. Stable CTs of the chest and abdomen. No evidence of local recurrence or metastatic disease. 2. Stable mild dilatation of the  ascending aorta and aortic atherosclerosis.   Electronically Signed   By: Richardean Sale M.D.   On: 10/21/2017 12:04  RADIOGRAPHIC STUDIES: I have personally reviewed the radiological images as listed and agreed with the findings in the report. No results found.   ASSESSMENT & PLAN:  Cancer of hilus of left lung ([place]) Stage IV NED. Patient currently on Alimta maintenance since 2012. MARCH 2019  2018- CT chest abdomen pelvis NED; No clinical evidence of progression at this time.   #  Proceed with chemo/ alimta today;  Labs today reviewed;  acceptable for treatment today.   # Bil LE edema Grade 1-continue diuretics.  # Follow-up with me in 3 weeks labs/chemo.  [thursday]  No orders of the defined types were placed in this encounter.     [name], MD 02/28/2018 2:31 PM

## 2018-02-28 NOTE — Progress Notes (Signed)
Mr. Teal returns to clinic today for consideration of cycle #618 Alimta infusion on the [employer] research study. Patient still experiencing edema in his BLE and continues to take Lasix and Spironolactone daily for the edema. He also continues to experience the odd itching in his palms and the bottoms of his feet at times. Abrasions on his right knuckles have scabbed over now and did not have any signs of infection. Patient's VS are stable today. His weight is up a little since last visit at 217 lbs(98.4 kg). Mr. Carlile also verifies that he is taking Folic Acid and Mobic daily as prescribed, and he receives vitamin B12 injections in clinic every 9 weeks per protocol, which is due today. Patient's glucose level 114 mg/dL non-fasting. Platelets remains within normal range today at 169,000. Sodium level remains a little low at 131 mmol/L. CBC and chemistry levels are otherwise within acceptable parameters for patient to receive his treatment today. Patient will receive Alimta infusion as scheduled today with no dose modifications. Dr. Rogue Bussing in to examine patient and patient discusses how his fatigue symptoms have changed. He used to feel fatigued and require an afternoon nap the day following his Alimta infusion, but now the more intense fatigue hits him 2 days later and lasts about 3 days until he returns to normal energy level. Patient will continue maintenance Alimta on the S130 protocol with no dose modifications. Current adverse events with grade and attribution as follows:   Adverse Event Log Study/Protocol: [name] S130 Cycle: 617 Event Grade Onset Date Resolved Date Drug Name Pemetrexed Attribution Treatment Comments  Edema (mild) Grade 1    12/06/2017   Possible  1+ edema in ble up to calf  Fatigue Grade1 02/03/16   Possible    Tendonitis Grade 1 09/21/16   Unrelated  Meloxicam  Hyponatremia Grade 1 01/17/18   Possible  Sodium 135  Pruritis Grade 1 04/19/17   Possible Uses generic lotion  with relief deep itching under the skin of palms & feet, new in right upper arm   Abrasions on Rt knuckles Grade 1 02/03/18 02/28/18  Unrelated  Abrasions are scabbed over today  Yolande Jolly, BSN, MHA, OCN 02/28/2018 2:11 PM

## 2018-03-20 ENCOUNTER — Inpatient Hospital Stay (HOSPITAL_BASED_OUTPATIENT_CLINIC_OR_DEPARTMENT_OTHER): Payer: BLUE CROSS/BLUE SHIELD | Admitting: Internal Medicine

## 2018-03-20 ENCOUNTER — Other Ambulatory Visit: Payer: Self-pay

## 2018-03-20 ENCOUNTER — Inpatient Hospital Stay: Payer: BLUE CROSS/BLUE SHIELD

## 2018-03-20 ENCOUNTER — Inpatient Hospital Stay: Payer: BLUE CROSS/BLUE SHIELD | Attending: Internal Medicine | Admitting: *Deleted

## 2018-03-20 ENCOUNTER — Encounter: Payer: Self-pay | Admitting: *Deleted

## 2018-03-20 VITALS — BP 108/72 | HR 97 | Temp 97.8°F | Resp 20 | Ht 70.0 in | Wt 212.0 lb

## 2018-03-20 DIAGNOSIS — C3402 Malignant neoplasm of left main bronchus: Secondary | ICD-10-CM | POA: Diagnosis not present

## 2018-03-20 DIAGNOSIS — Z5111 Encounter for antineoplastic chemotherapy: Secondary | ICD-10-CM | POA: Insufficient documentation

## 2018-03-20 DIAGNOSIS — R609 Edema, unspecified: Secondary | ICD-10-CM

## 2018-03-20 DIAGNOSIS — C3492 Malignant neoplasm of unspecified part of left bronchus or lung: Secondary | ICD-10-CM | POA: Insufficient documentation

## 2018-03-20 DIAGNOSIS — Z79899 Other long term (current) drug therapy: Secondary | ICD-10-CM | POA: Diagnosis not present

## 2018-03-20 DIAGNOSIS — Z006 Encounter for examination for normal comparison and control in clinical research program: Secondary | ICD-10-CM | POA: Diagnosis not present

## 2018-03-20 DIAGNOSIS — R600 Localized edema: Secondary | ICD-10-CM | POA: Insufficient documentation

## 2018-03-20 DIAGNOSIS — I700 Atherosclerosis of aorta: Secondary | ICD-10-CM | POA: Insufficient documentation

## 2018-03-20 LAB — URINALYSIS, COMPLETE (UACMP) WITH MICROSCOPIC
Bilirubin Urine: NEGATIVE
GLUCOSE, UA: NEGATIVE mg/dL
HGB URINE DIPSTICK: NEGATIVE
Ketones, ur: 5 mg/dL — AB
LEUKOCYTES UA: NEGATIVE
Nitrite: NEGATIVE
PROTEIN: NEGATIVE mg/dL
Specific Gravity, Urine: 1.016 (ref 1.005–1.030)
Squamous Epithelial / LPF: NONE SEEN
pH: 5 (ref 5.0–8.0)

## 2018-03-20 LAB — CBC WITH DIFFERENTIAL/PLATELET
BASOS ABS: 0 10*3/uL (ref 0–0.1)
Basophils Relative: 1 %
EOS ABS: 0.2 10*3/uL (ref 0–0.7)
EOS PCT: 6 %
HCT: 40.9 % (ref 40.0–52.0)
Hemoglobin: 14.3 g/dL (ref 13.0–18.0)
Lymphocytes Relative: 31 %
Lymphs Abs: 1.2 10*3/uL (ref 1.0–3.6)
MCH: 33 pg (ref 26.0–34.0)
MCHC: 34.9 g/dL (ref 32.0–36.0)
MCV: 94.7 fL (ref 80.0–100.0)
Monocytes Absolute: 0.4 10*3/uL (ref 0.2–1.0)
Monocytes Relative: 10 %
Neutro Abs: 2.1 10*3/uL (ref 1.4–6.5)
Neutrophils Relative %: 52 %
PLATELETS: 150 10*3/uL (ref 150–440)
RBC: 4.32 MIL/uL — ABNORMAL LOW (ref 4.40–5.90)
RDW: 13.4 % (ref 11.5–14.5)
WBC: 4 10*3/uL (ref 3.8–10.6)

## 2018-03-20 LAB — COMPREHENSIVE METABOLIC PANEL
ALT: 20 U/L (ref 17–63)
AST: 26 U/L (ref 15–41)
Albumin: 4.1 g/dL (ref 3.5–5.0)
Alkaline Phosphatase: 41 U/L (ref 38–126)
Anion gap: 9 (ref 5–15)
BILIRUBIN TOTAL: 1 mg/dL (ref 0.3–1.2)
BUN: 20 mg/dL (ref 6–20)
CALCIUM: 9.4 mg/dL (ref 8.9–10.3)
CO2: 25 mmol/L (ref 22–32)
CREATININE: 0.97 mg/dL (ref 0.61–1.24)
Chloride: 101 mmol/L (ref 101–111)
GFR calc Af Amer: >60 mL/min (ref >60)
Glucose, Bld: 161 mg/dL — ABNORMAL HIGH (ref 65–99)
Potassium: 3.7 mmol/L (ref 3.5–5.1)
Sodium: 135 mmol/L (ref 135–145)
TOTAL PROTEIN: 7.4 g/dL (ref 6.5–8.1)

## 2018-03-20 LAB — MAGNESIUM: Magnesium: 1.8 mg/dL (ref 1.7–2.4)

## 2018-03-20 MED ORDER — SODIUM CHLORIDE 0.9 % IV SOLN
Freq: Once | INTRAVENOUS | Status: AC
  Administered 2018-03-20: 14:00:00 via INTRAVENOUS
  Filled 2018-03-20: qty 4

## 2018-03-20 MED ORDER — SODIUM CHLORIDE 0.9 % IV SOLN
Freq: Once | INTRAVENOUS | Status: AC
  Administered 2018-03-20: 14:00:00 via INTRAVENOUS
  Filled 2018-03-20: qty 1000

## 2018-03-20 MED ORDER — HEPARIN SOD (PORK) LOCK FLUSH 100 UNIT/ML IV SOLN
500.0000 [IU] | Freq: Once | INTRAVENOUS | Status: AC
  Administered 2018-03-20: 500 [IU] via INTRAVENOUS

## 2018-03-20 MED ORDER — SODIUM CHLORIDE 0.9 % IV SOLN
500.0000 mg/m2 | Freq: Once | INTRAVENOUS | Status: AC
  Administered 2018-03-20: 1050 mg via INTRAVENOUS
  Filled 2018-03-20: qty 42

## 2018-03-20 NOTE — Assessment & Plan Note (Addendum)
Stage IV NED. Patient currently on Alimta maintenance since 2012. MARCH 2019  2019- CT chest abdomen pelvis NED; No clinical evidence of progression at this time.   #  Proceed with chemo/ alimta today;  Labs today reviewed; acceptable for treatment today.   # Bil LE edema Grade 1-continue diuretics.  # Follow-up with me in 3 weeks labs/chemo.

## 2018-03-20 NOTE — Progress Notes (Signed)
[place] OFFICE PROGRESS NOTE  Patient Care Team: [name], [name], stage 4 [facility])   Staging form: Lung, AJCC 7th Edition     Clinical stage from 05/24/2015: Stage IV ([place], NX, [name], MD on 05/24/2015    Oncology History   # 2012- METASTATIC ADENO CA of LEFT LUNG [acinar pattern] s/p MED LN Bx [NEG- EGFR/K-ras/ALK mutation];[name]; [name]; CT JULY 2016- NED; JAN 2018- NED  # Thoracic Aneurysm [4cm- stable]     Cancer of hilus of left lung ([place])   07/20/2016 Initial Diagnosis    Cancer of hilus of left lung ([place])       INTERVAL HISTORY:  A pleasant 57 year old [place] male patient with above history of metastatic adenocarcinoma the lung currently on maintenance Alimta since June 2012 is here for follow-up prior to his chemotherapy.  Patient that he had a minor trauma to head while playing tennis; no loss of consciousness or hitting the head  To the floor.  Denies any shortness of breath or cough.  denies any chest pain. Denies any tingling and numbness.  Mild swelling in the legs.  REVIEW OF SYSTEMS:  A complete 10 point review of system is done which is negative except mentioned above/history of present illness.   PAST MEDICAL HISTORY :  Past Medical History:  Diagnosis Date  . Allergy   . Lung cancer ([place])   . Mild valvular heart disease Nov. 2014   per 2 D Echocardiogram done for persistent leg edema, monitored by cardiology  . Peripheral edema     PAST SURGICAL HISTORY :   Past Surgical History:  Procedure Laterality Date  . ELBOW SURGERY    . Left anterior thoracotomy with biopsy  March 2012  . PORTACATH PLACEMENT  March 2012  . TONSILLECTOMY      FAMILY HISTORY :   Family History  Problem Relation Age of Onset  . Leukemia Father   .  Diabetes Mother     SOCIAL HISTORY:   Social History   Tobacco Use  . Smoking status: Never Smoker  . Smokeless tobacco: Never Used  Substance Use Topics  . Alcohol use: Yes    Alcohol/week: 0.0 oz    Comment: "social drinker"   . Drug use: No    ALLERGIES:  is allergic to penicillins.  MEDICATIONS:  Current Outpatient Medications  Medication Sig Dispense Refill  . folic acid (FOLVITE) 1 MG tablet TAKE 1 TABLET (1 MG TOTAL) BY MOUTH DAILY. 30 tablet 11  . furosemide (LASIX) 20 MG tablet TAKE 1 TABLET (20 MG TOTAL) BY MOUTH DAILY. ONE TABLET DAILY AS NEEDED FOR SWELLING 90 tablet 1  . loratadine (CLARITIN) 10 MG tablet Take 1 tablet (10 mg total) by mouth daily.    . meloxicam (MOBIC) 7.5 MG tablet Take 7.5 mg by mouth daily.     . Multiple Vitamin (MULTIVITAMIN) capsule Take 1 capsule by mouth daily. Reported on 02/24/2016    . Omega-3 Fatty Acids (FISH OIL) 1000 MG CAPS Take 1 capsule by mouth daily. Reported on 02/24/2016    . senna (SENOKOT) 8.6 MG tablet Take 1 tablet by mouth daily. Take as needed around time of chemotherapy    . spironolactone (ALDACTONE) 25 MG tablet Take 1 tablet (25 mg total) by mouth daily. 90 tablet 3  .  vitamin B-12 (CYANOCOBALAMIN) 1000 MCG tablet Take 1,000 mcg by mouth daily.    . ondansetron (ZOFRAN-ODT) 4 MG disintegrating tablet Take 1 tablet (4 mg total) by mouth every 4 (four) hours as needed for nausea or vomiting. (Patient not taking: Reported on 02/28/2018) 20 tablet 2  . tadalafil (CIALIS) 20 MG tablet Take 1 tablet by mouth every 2 days as needed. (Patient not taking: Reported on 02/28/2018) 6 tablet 11   No current facility-administered medications for this visit.    Facility-Administered Medications Ordered in Other Visits  Medication Dose Route Frequency Provider Last Rate Last Dose  . sodium chloride 0.9 % injection 10 mL  10 mL Intracatheter PRN [name], MD   10 mL at 05/27/15 1543  . sodium chloride 0.9 % injection 10 mL  10 mL  Intracatheter PRN [name], MD   10 mL at 06/17/15 1510  . sodium chloride 0.9 % injection 10 mL  10 mL Intravenous PRN [name], MD   10 mL at 07/08/15 1340    PHYSICAL EXAMINATION: ECOG PERFORMANCE STATUS: 0 - Asymptomatic  BP 108/72 (BP Location: Right Arm, Patient Position: Sitting)   Pulse 97   Temp 97.8 F (36.6 C) (Tympanic)   Resp 20   Ht '5\' 10"'  (1.778 m)   Wt 212 lb (96.2 kg)   SpO2 98% Comment: RA  BMI 30.42 kg/m   Filed Weights   03/20/18 1331  Weight: 212 lb (96.2 kg)     GENERAL: Well-nourished well-developed; Alert, [name]; good dentition  NECK: supple, [name], [name]; 1+ bilateral lower extremity swelling. ABDOMEN:abdomen soft, [name];  LABORATORY DATA:  I have reviewed the data as listed    Component Value Date/Time   NA 135 03/20/2018 0903   NA 138 12/10/2014 1341   K 3.7 03/20/2018 0903   K 3.6 12/10/2014 1341   CL 101 03/20/2018 0903   CL 102 12/10/2014 1341   CO2 25 03/20/2018 0903   CO2 30 12/10/2014 1341   GLUCOSE 161 (H) 03/20/2018 0903   GLUCOSE 160 (H) 12/10/2014 1341   BUN 20 03/20/2018 0903   BUN 21 (H) 12/10/2014 1341   CREATININE 0.97 03/20/2018 0903   CREATININE 1.10 03/25/2015 1453   CALCIUM 9.4 03/20/2018 0903   CALCIUM 8.4 (L) 12/10/2014 1341   PROT 7.4 03/20/2018 0903   PROT 6.6 12/10/2014 1341   ALBUMIN 4.1 03/20/2018 0903   ALBUMIN 3.4 12/10/2014 1341   AST 26 03/20/2018 0903   AST 17 12/10/2014 1341   ALT 20 03/20/2018  0903   ALT 23 12/10/2014 1341   ALKPHOS 41 03/20/2018 0903   ALKPHOS 50 12/10/2014 1341   BILITOT 1.0 03/20/2018 0903   BILITOT 0.5 12/10/2014 1341   GFRNONAA >60 03/20/2018 0903   GFRNONAA >60 03/25/2015 1453   GFRAA >60 03/20/2018 0903   GFRAA >60 03/25/2015 1453    No results found for: SPEP, UPEP  Lab Results  Component Value Date   WBC 4.0 03/20/2018   NEUTROABS 2.1 03/20/2018   HGB 14.3 03/20/2018  HCT 40.9 03/20/2018   MCV 94.7 03/20/2018   PLT 150 03/20/2018      Chemistry      Component Value Date/Time   NA 135 03/20/2018 0903   NA 138 12/10/2014 1341   K 3.7 03/20/2018 0903   K 3.6 12/10/2014 1341   CL 101 03/20/2018 0903   CL 102 12/10/2014 1341   CO2 25 03/20/2018 0903   CO2 30 12/10/2014 1341   BUN 20 03/20/2018 0903   BUN 21 (H) 12/10/2014 1341   CREATININE 0.97 03/20/2018 0903   CREATININE 1.10 03/25/2015 1453      Component Value Date/Time   CALCIUM 9.4 03/20/2018 0903   CALCIUM 8.4 (L) 12/10/2014 1341   ALKPHOS 41 03/20/2018 0903   ALKPHOS 50 12/10/2014 1341   AST 26 03/20/2018 0903   AST 17 12/10/2014 1341   ALT 20 03/20/2018 0903   ALT 23 12/10/2014 1341   BILITOT 1.0 03/20/2018 0903   BILITOT 0.5 12/10/2014 1341      IMPRESSION: 1. Stable CTs of the chest and abdomen. No evidence of local recurrence or metastatic disease. 2. Stable mild dilatation of the ascending aorta and aortic atherosclerosis.   Electronically Signed   By: Richardean Sale M.D.   On: 10/21/2017 12:04  RADIOGRAPHIC STUDIES: I have personally reviewed the radiological images as listed and agreed with the findings in the report. No results found.   ASSESSMENT & PLAN:  Cancer of hilus of left lung ([place]) Stage IV NED. Patient currently on Alimta maintenance since 2012. MARCH 2019  2019- CT chest abdomen pelvis NED; No clinical evidence of progression at this time.   #  Proceed with chemo/ alimta today;  Labs today reviewed; acceptable for treatment today.    # Bil LE edema Grade 1-continue diuretics.  # Follow-up with me in 3 weeks labs/chemo.   No orders of the defined types were placed in this encounter.     [name], MD 03/20/2018 1:51 PM

## 2018-03-20 NOTE — Progress Notes (Addendum)
Mitchell Mosley returns to clinic today for consideration of cycle #619 Alimta infusion on the [employer] research study. Patient still experiencing edema in his BLE and continues to take Lasix and Spironolactone daily for the edema. He also continues to experience the odd itching in his palms and the bottoms of his feet at times. Abrasions on his right knuckles have scabbed over now and did not have any signs of infection. Patient's VS are stable today. His weight is back down to 212 lbs(96.2 kg) since last visit and no dose modification is required. Mitchell Mosley also verifies that he is taking Folic Acid and Mobic daily as prescribed, and he receives vitamin B12 injections in clinic every 9 weeks per protocol. Patient's glucose level 161 mg/dL non-fasting. Platelets are borderline normal range today at 150,000, but this is patient's norm. Sodium level is 135 mmol/L. CBC and chemistry levels are otherwise within acceptable parameters for patient to receive his treatment today. Patient will receive Alimta infusion as scheduled today with no dose modifications. Dr. Rogue Bussing in to examine patient and patient discusses how his fatigue symptoms improved since receiving his B12 injection last visit. His fatigue was less intense and he experienced a quicker return to his normal energy level. Patient will continue maintenance Alimta on the S130 protocol with no dose modifications. He did not have his urine specimen collected this morning, so the infusion RN will collect and submit the specimen to the lab prior to administering patient's Alimta infusion this afternoon. Current adverse events with grade and attribution as follows:   Adverse Event Log Study/Protocol: [name] S130 Cycle: 618 Event Grade Onset Date Resolved Date Drug Name Pemetrexed Attribution Treatment Comments  Edema (mild) Grade 1    12/06/2017   Possible  1+ edema in ble up to calf  Fatigue Grade1    01/14/16   Possible    Tendonitis Grade 1     09/21/16   Unrelated  Meloxicam  Hyponatremia Grade 1    01/17/18     03/20/18  Possible  Sodium 135  Pruritis Grade 1     04/19/17   Possible Uses generic lotion with relief deep itching under the skin of palms & feet, new in right upper arm   Yolande Jolly, BSN, MHA, OCN 03/20/2018 1:57 PM

## 2018-04-10 ENCOUNTER — Other Ambulatory Visit: Payer: Self-pay | Admitting: *Deleted

## 2018-04-10 DIAGNOSIS — C3402 Malignant neoplasm of left main bronchus: Secondary | ICD-10-CM

## 2018-04-10 NOTE — Progress Notes (Signed)
Please note start date of fatigue was 01/14/2016.  Yolande Jolly, BSN, MHA, OCN 04/10/2018 3:18 PM

## 2018-04-10 NOTE — Progress Notes (Signed)
Please note start date of fatigue was 01/14/2016.  Mitchell Mosley, BSN, MHA, OCN 04/10/2018 3:18 PM

## 2018-04-11 ENCOUNTER — Inpatient Hospital Stay: Payer: BLUE CROSS/BLUE SHIELD | Attending: Internal Medicine

## 2018-04-11 ENCOUNTER — Encounter: Payer: Self-pay | Admitting: *Deleted

## 2018-04-11 ENCOUNTER — Inpatient Hospital Stay: Payer: BLUE CROSS/BLUE SHIELD

## 2018-04-11 ENCOUNTER — Inpatient Hospital Stay (HOSPITAL_BASED_OUTPATIENT_CLINIC_OR_DEPARTMENT_OTHER): Payer: BLUE CROSS/BLUE SHIELD | Admitting: Internal Medicine

## 2018-04-11 VITALS — BP 123/78 | HR 76 | Temp 98.7°F | Resp 16 | Wt 210.0 lb

## 2018-04-11 DIAGNOSIS — C3402 Malignant neoplasm of left main bronchus: Secondary | ICD-10-CM | POA: Insufficient documentation

## 2018-04-11 DIAGNOSIS — M7989 Other specified soft tissue disorders: Secondary | ICD-10-CM | POA: Insufficient documentation

## 2018-04-11 DIAGNOSIS — Z006 Encounter for examination for normal comparison and control in clinical research program: Secondary | ICD-10-CM

## 2018-04-11 DIAGNOSIS — M2550 Pain in unspecified joint: Secondary | ICD-10-CM | POA: Insufficient documentation

## 2018-04-11 DIAGNOSIS — M79605 Pain in left leg: Secondary | ICD-10-CM | POA: Insufficient documentation

## 2018-04-11 DIAGNOSIS — R600 Localized edema: Secondary | ICD-10-CM | POA: Diagnosis not present

## 2018-04-11 DIAGNOSIS — Z5111 Encounter for antineoplastic chemotherapy: Secondary | ICD-10-CM | POA: Diagnosis not present

## 2018-04-11 LAB — CBC WITH DIFFERENTIAL/PLATELET
BASOS ABS: 0 10*3/uL (ref 0–0.1)
Basophils Relative: 1 %
EOS ABS: 0.3 10*3/uL (ref 0–0.7)
EOS PCT: 6 %
HEMATOCRIT: 41.2 % (ref 40.0–52.0)
Hemoglobin: 14.4 g/dL (ref 13.0–18.0)
Lymphocytes Relative: 30 %
Lymphs Abs: 1.4 10*3/uL (ref 1.0–3.6)
MCH: 33.1 pg (ref 26.0–34.0)
MCHC: 34.9 g/dL (ref 32.0–36.0)
MCV: 94.8 fL (ref 80.0–100.0)
Monocytes Absolute: 0.5 10*3/uL (ref 0.2–1.0)
Monocytes Relative: 11 %
Neutro Abs: 2.3 10*3/uL (ref 1.4–6.5)
Neutrophils Relative %: 52 %
Platelets: 163 10*3/uL (ref 150–440)
RBC: 4.34 MIL/uL — AB (ref 4.40–5.90)
RDW: 13.4 % (ref 11.5–14.5)
WBC: 4.5 10*3/uL (ref 3.8–10.6)

## 2018-04-11 LAB — COMPREHENSIVE METABOLIC PANEL
ALT: 18 U/L (ref 17–63)
AST: 25 U/L (ref 15–41)
Albumin: 4 g/dL (ref 3.5–5.0)
Alkaline Phosphatase: 44 U/L (ref 38–126)
Anion gap: 11 (ref 5–15)
BILIRUBIN TOTAL: 0.9 mg/dL (ref 0.3–1.2)
BUN: 24 mg/dL — AB (ref 6–20)
CO2: 24 mmol/L (ref 22–32)
CREATININE: 1.14 mg/dL (ref 0.61–1.24)
Calcium: 9.5 mg/dL (ref 8.9–10.3)
Chloride: 102 mmol/L (ref 101–111)
Glucose, Bld: 161 mg/dL — ABNORMAL HIGH (ref 65–99)
POTASSIUM: 3.8 mmol/L (ref 3.5–5.1)
Sodium: 137 mmol/L (ref 135–145)
TOTAL PROTEIN: 7.3 g/dL (ref 6.5–8.1)

## 2018-04-11 MED ORDER — SODIUM CHLORIDE 0.9 % IV SOLN
Freq: Once | INTRAVENOUS | Status: AC
  Administered 2018-04-11: 15:00:00 via INTRAVENOUS
  Filled 2018-04-11: qty 4

## 2018-04-11 MED ORDER — SODIUM CHLORIDE 0.9 % IV SOLN
500.0000 mg/m2 | Freq: Once | INTRAVENOUS | Status: AC
  Administered 2018-04-11: 1050 mg via INTRAVENOUS
  Filled 2018-04-11: qty 42

## 2018-04-11 MED ORDER — SODIUM CHLORIDE 0.9 % IV SOLN
Freq: Once | INTRAVENOUS | Status: AC
  Administered 2018-04-11: 15:00:00 via INTRAVENOUS
  Filled 2018-04-11: qty 1000

## 2018-04-11 MED ORDER — SODIUM CHLORIDE 0.9% FLUSH
10.0000 mL | INTRAVENOUS | Status: DC | PRN
  Administered 2018-04-11: 10 mL via INTRAVENOUS
  Filled 2018-04-11: qty 10

## 2018-04-11 MED ORDER — HEPARIN SOD (PORK) LOCK FLUSH 100 UNIT/ML IV SOLN
500.0000 [IU] | Freq: Once | INTRAVENOUS | Status: AC
  Administered 2018-04-11: 500 [IU] via INTRAVENOUS

## 2018-04-11 NOTE — Progress Notes (Signed)
Mitchell Mosley returns to clinic today for consideration of cycle #620 Alimta infusion on the [employer] research study. Reports edema in his BLE continues and he takes Lasix and Spironolactone daily for the edema. States it does not seem to be related to Alimta, but may increase or decrease spontaneously. He also continues to experience the odd itching in his palms and the bottoms of his feet at times. Patient's VS are stable today. His weight is 210 lbs(95.3 kg) today and no dose modification is required. Mitchell Mosley also verifies that he is taking Folic Acid and Mobic daily as prescribed, and he receives vitamin B12 injections in clinic every 9 weeks per protocol. Patient's glucose level 161 mg/dL non-fasting again today. Platelets are normal range today at 163,000. Sodium level is 137 mmol/L. CBC and chemistry levels are otherwise within acceptable parameters for patient to receive his treatment today. Patient will receive Alimta infusion as scheduled today with no dose modifications. Dr. Rogue Bussing in to examine patient and discusses studies showing that people who play tennis actually live longer. Patient reports that he had a cramp a couple of days ago that began in his lefty groin area and radiated down his leg. Following Dr. Aletha Halim examination, he does not seem to have any residual effects from the cramp, and the patient could not identify a specific cause other than the fact that he is on his feet almost continually every day and does play a lot of tennis as well. His fatigue continues to increase shortly after infusion, then he gradually returns to normal energy level. Patient will continue maintenance Alimta on the S130 protocol with no dose modifications. He is due to have another B12 injection next visit, and will schedule next CT scan on 05/19/18 at that time as well. Adverse event list update per study monitor to include several ongoing events that have not yet resolved. Current and ongoing  adverse events with grade and attribution are as follows:   Adverse Event Log Study/Protocol: [facility] Mos[facility] S130 Cycle: 619 Event Grade Onset Date Resolved Date Drug Name Pemetrexed Attribution Treatment Comments  Edema (mild) Grade 1    12/06/2017   Possible  1+ edema in ble up to calf  Fatigue Grade1    01/14/16   Possible    Tendonitis Grade 1    09/21/16   Unrelated  Meloxicam  Pruritis Grade 1     04/19/17   Possible Uses generic lotion with relief deep itching under the skin of palms & feet, new in right upper arm   Elevated cholesterol Grade 1 03/15/14   Unrelated  No ongoing monitoring available  Valvular Heart Disease Grade 1 10/19/13   Unrelated    Erectile Dysfunction Grade 2 12/10/17   Possible Takes Cialis   Skin hyperpigmentation Grade 1 07/08/15   Unrelated  Medial aspect of feel bil  Aneurysm of Ascending Aorta Grade 1 10/14/13   Unrelaed  mentioned on CT scans  Leg cramp with residual soreness Grade 1 04/09/18 04/11/18  Unrelated  Single episode  Yolande Jolly, BSN, MHA, OCN 04/11/2018 1:57 PM

## 2018-04-11 NOTE — Addendum Note (Signed)
Addended by: [name] on: 04/11/2018 02:37 PM   Modules accepted: Orders

## 2018-04-11 NOTE — Assessment & Plan Note (Addendum)
#  Stage IV adenocarcinoma the lung; currently NED/March 2019 CT scan shows no evidence of disease  #Proceed with Alimta chemotherapy.Labs today reviewed;  acceptable for treatment today.  #  bilateral lower extremity edema-intermittent continue diuretics.  #Left thigh pain-new question etiology; question muscle sprain versus others.  If worsens recommend ultrasound Dopplers.  # Follow-up with me in 3 weeks labs/chemo.  Discussed with clinical trials nursing staff

## 2018-04-11 NOTE — Progress Notes (Signed)
[facility] OFFICE PROGRESS NOTE  Patient Care Team: [name], MD as PCP - General [facility])  Cancer Staging No matching staging information was found for the patient.   Oncology History   # 2012- METASTATIC ADENO CA of LEFT LUNG [acinar pattern] s/p MED LN Bx [NEG- EGFR/K-ras/ALK mutation];[name]; [name]; CT JULY 2016- NED; JAN 2018- NED  # Thoracic Aneurysm [4cm- stable]     Cancer of hilus of left lung ([place])   07/20/2016 Initial Diagnosis    Cancer of hilus of left lung ([place])         INTERVAL HISTORY:  Mitchell Mosley 57 y.o.  male pleasant patient above history of adenocarcinoma the lung stage IV/currently on maintenance Alimta is here for follow-up.  Patient denies any unusual shortness of breath or cough he denies any pain.  No nausea no vomiting.  Continues to have intermittent swelling in the legs.  He noted to have episode of  Pain in his left thigh radiating of the leg.  Currently resolved.  No swelling in the leg.  Review of Systems  Constitutional: Negative for chills, diaphoresis, fever, malaise/fatigue and weight loss.  HENT: Negative for nosebleeds and sore throat.   Eyes: Negative for double vision.  Respiratory: Negative for cough, hemoptysis, sputum production, shortness of breath and wheezing.   Cardiovascular: Positive for leg swelling. Negative for chest pain, palpitations and orthopnea.  Gastrointestinal: Negative for abdominal pain, blood in stool, constipation, diarrhea, heartburn, melena, nausea and vomiting.  Genitourinary: Negative for dysuria, frequency and urgency.  Musculoskeletal: Negative for back pain and joint pain.  Skin: Negative.  Negative for itching and rash.  Neurological: Negative for dizziness, tingling, focal weakness, weakness and headaches.  Endo/Heme/Allergies: Does not bruise/bleed easily.   Psychiatric/Behavioral: Negative for depression. The patient is not nervous/anxious and does not have insomnia.       PAST MEDICAL HISTORY :  Past Medical History:  Diagnosis Date  . Allergy   . Lung cancer ([place])   . Mild valvular heart disease Nov. 2014   per 2 D Echocardiogram done for persistent leg edema, monitored by cardiology  . Peripheral edema     PAST SURGICAL HISTORY :   Past Surgical History:  Procedure Laterality Date  . ELBOW SURGERY    . Left anterior thoracotomy with biopsy  March 2012  . PORTACATH PLACEMENT  March 2012  . TONSILLECTOMY      FAMILY HISTORY :   Family History  Problem Relation Age of Onset  . Leukemia Father   . Diabetes Mother     SOCIAL HISTORY:   Social History   Tobacco Use  . Smoking status: Never Smoker  . Smokeless tobacco: Never Used  Substance Use Topics  . Alcohol use: Yes    Alcohol/week: 0.0 oz    Comment: "social drinker"   . Drug use: No    ALLERGIES:  is allergic to penicillins.  MEDICATIONS:  Current Outpatient Medications  Medication Sig Dispense Refill  . folic acid (FOLVITE) 1 MG tablet TAKE 1 TABLET (1 MG TOTAL) BY MOUTH DAILY. 30 tablet 11  . furosemide (LASIX) 20 MG tablet TAKE 1 TABLET (20 MG TOTAL) BY MOUTH DAILY. ONE TABLET DAILY AS NEEDED FOR SWELLING 90 tablet 1  . loratadine (CLARITIN) 10 MG tablet Take 1 tablet (10 mg total) by mouth daily.    . meloxicam (MOBIC) 7.5 MG tablet Take 7.5  mg by mouth daily.     . Multiple Vitamin (MULTIVITAMIN) capsule Take 1 capsule by mouth daily. Reported on 02/24/2016    . Omega-3 Fatty Acids (FISH OIL) 1000 MG CAPS Take 1 capsule by mouth daily. Reported on 02/24/2016    . senna (SENOKOT) 8.6 MG tablet Take 1 tablet by mouth daily. Take as needed around time of chemotherapy    . spironolactone (ALDACTONE) 25 MG tablet Take 1 tablet (25 mg total) by mouth daily. 90 tablet 3  . tadalafil (CIALIS) 20 MG tablet Take 1 tablet by mouth every 2 days as needed. 6 tablet 11   . vitamin B-12 (CYANOCOBALAMIN) 1000 MCG tablet Take 1,000 mcg by mouth daily.    . ondansetron (ZOFRAN-ODT) 4 MG disintegrating tablet Take 1 tablet (4 mg total) by mouth every 4 (four) hours as needed for nausea or vomiting. (Patient not taking: Reported on 04/11/2018) 20 tablet 2   No current facility-administered medications for this visit.    Facility-Administered Medications Ordered in Other Visits  Medication Dose Route Frequency Provider Last Rate Last Dose  . heparin lock flush 100 unit/mL  500 Units Intravenous Once Charlaine Dalton R, [name], MD   10 mL at 05/27/15 1543  . sodium chloride 0.9 % injection 10 mL  10 mL Intracatheter PRN [name], MD   10 mL at 06/17/15 1510  . sodium chloride 0.9 % injection 10 mL  10 mL Intravenous PRN Choksi, Delorise Shiner, MD   10 mL at 07/08/15 1340  . sodium chloride flush (NS) 0.9 % injection 10 mL  10 mL Intravenous PRN [name], MD   10 mL at 04/11/18 0809    PHYSICAL EXAMINATION: ECOG PERFORMANCE STATUS: 0 - Asymptomatic  BP 123/78 (BP Location: Left Arm, Patient Position: Sitting)   Pulse 76   Temp 98.7 F (37.1 C) (Tympanic)   Resp 16   Wt 210 lb (95.3 kg)   BMI 30.13 kg/m   Filed Weights   04/11/18 1347  Weight: 210 lb (95.3 kg)    GENERAL: Well-nourished well-developed; Alert, no distress and comfortable.  Alone. EYES: no pallor or icterus OROPHARYNX: no thrush or ulceration; NECK: supple; no lymph nodes felt. LYMPH:  no palpable lymphadenopathy in the axillary or inguinal regions LUNGS: Decreased breath sounds auscultation bilaterally. No wheeze or crackles HEART/CVS: regular rate & rhythm and no murmurs; No lower extremity edema ABDOMEN:abdomen soft, non-tender and normal bowel sounds. No hepatomegaly or splenomegaly.  Musculoskeletal:no cyanosis of digits and no clubbing  PSYCH: alert & oriented x 3 with fluent  speech NEURO: no focal motor/sensory deficits SKIN:  no rashes or significant lesions    LABORATORY DATA:  I have reviewed the data as listed    Component Value Date/Time   NA 137 04/11/2018 0805   NA 138 12/10/2014 1341   K 3.8 04/11/2018 0805   K 3.6 12/10/2014 1341   CL 102 04/11/2018 0805   CL 102 12/10/2014 1341   CO2 24 04/11/2018 0805   CO2 30 12/10/2014 1341   GLUCOSE 161 (H) 04/11/2018 0805   GLUCOSE 160 (H) 12/10/2014 1341   BUN 24 (H) 04/11/2018 0805   BUN 21 (H) 12/10/2014 1341   CREATININE 1.14 04/11/2018 0805   CREATININE 1.10 03/25/2015 1453   CALCIUM 9.5 04/11/2018 0805   CALCIUM 8.4 (L) 12/10/2014 1341   PROT 7.3 04/11/2018 0805  PROT 6.6 12/10/2014 1341   ALBUMIN 4.0 04/11/2018 0805   ALBUMIN 3.4 12/10/2014 1341   AST 25 04/11/2018 0805   AST 17 12/10/2014 1341   ALT 18 04/11/2018 0805   ALT 23 12/10/2014 1341   ALKPHOS 44 04/11/2018 0805   ALKPHOS 50 12/10/2014 1341   BILITOT 0.9 04/11/2018 0805   BILITOT 0.5 12/10/2014 1341   GFRNONAA >60 04/11/2018 0805   GFRNONAA >60 03/25/2015 1453   GFRAA >60 04/11/2018 0805   GFRAA >60 03/25/2015 1453    No results found for: SPEP, UPEP  Lab Results  Component Value Date   WBC 4.5 04/11/2018   NEUTROABS 2.3 04/11/2018   HGB 14.4 04/11/2018   HCT 41.2 04/11/2018   MCV 94.8 04/11/2018   PLT 163 04/11/2018      Chemistry      Component Value Date/Time   NA 137 04/11/2018 0805   NA 138 12/10/2014 1341   K 3.8 04/11/2018 0805   K 3.6 12/10/2014 1341   CL 102 04/11/2018 0805   CL 102 12/10/2014 1341   CO2 24 04/11/2018 0805   CO2 30 12/10/2014 1341   BUN 24 (H) 04/11/2018 0805   BUN 21 (H) 12/10/2014 1341   CREATININE 1.14 04/11/2018 0805   CREATININE 1.10 03/25/2015 1453      Component Value Date/Time   CALCIUM 9.5 04/11/2018 0805   CALCIUM 8.4 (L) 12/10/2014 1341   ALKPHOS 44 04/11/2018 0805   ALKPHOS 50 12/10/2014 1341   AST 25 04/11/2018 0805   AST 17 12/10/2014 1341   ALT 18  04/11/2018 0805   ALT 23 12/10/2014 1341   BILITOT 0.9 04/11/2018 0805   BILITOT 0.5 12/10/2014 1341       RADIOGRAPHIC STUDIES: I have personally reviewed the radiological images as listed and agreed with the findings in the report. No results found.   ASSESSMENT & PLAN:  Cancer of hilus of left lung ([place]) #Stage IV adenocarcinoma the lung; currently NED/March 2019 CT scan shows no evidence of disease  #Proceed with Alimta chemotherapy.Labs today reviewed;  acceptable for treatment today.  #  bilateral lower extremity edema-intermittent continue diuretics.  #Left thigh pain-new question etiology; question muscle sprain versus others.  If worsens recommend ultrasound Dopplers.  # Follow-up with me in 3 weeks labs/chemo.  Discussed with clinical trials nursing staff    No orders of the defined types were placed in this encounter.  All questions were answered. The patient knows to call the clinic with any problems, questions or concerns.      [name], MD 04/11/2018 2:24 PM

## 2018-05-02 ENCOUNTER — Encounter: Payer: Self-pay | Admitting: *Deleted

## 2018-05-02 ENCOUNTER — Inpatient Hospital Stay: Payer: BLUE CROSS/BLUE SHIELD

## 2018-05-02 ENCOUNTER — Encounter: Payer: Self-pay | Admitting: Internal Medicine

## 2018-05-02 ENCOUNTER — Inpatient Hospital Stay (HOSPITAL_BASED_OUTPATIENT_CLINIC_OR_DEPARTMENT_OTHER): Payer: BLUE CROSS/BLUE SHIELD | Admitting: Internal Medicine

## 2018-05-02 VITALS — BP 126/82 | HR 80 | Temp 98.1°F | Resp 16 | Wt 208.6 lb

## 2018-05-02 DIAGNOSIS — M2550 Pain in unspecified joint: Secondary | ICD-10-CM

## 2018-05-02 DIAGNOSIS — M7989 Other specified soft tissue disorders: Secondary | ICD-10-CM

## 2018-05-02 DIAGNOSIS — M79605 Pain in left leg: Secondary | ICD-10-CM | POA: Diagnosis not present

## 2018-05-02 DIAGNOSIS — C3402 Malignant neoplasm of left main bronchus: Secondary | ICD-10-CM | POA: Diagnosis not present

## 2018-05-02 DIAGNOSIS — C3491 Malignant neoplasm of unspecified part of right bronchus or lung: Secondary | ICD-10-CM

## 2018-05-02 DIAGNOSIS — Z5111 Encounter for antineoplastic chemotherapy: Secondary | ICD-10-CM | POA: Diagnosis not present

## 2018-05-02 DIAGNOSIS — Z006 Encounter for examination for normal comparison and control in clinical research program: Secondary | ICD-10-CM

## 2018-05-02 DIAGNOSIS — R600 Localized edema: Secondary | ICD-10-CM | POA: Diagnosis not present

## 2018-05-02 LAB — COMPREHENSIVE METABOLIC PANEL
ALT: 15 U/L — AB (ref 17–63)
AST: 22 U/L (ref 15–41)
Albumin: 3.8 g/dL (ref 3.5–5.0)
Alkaline Phosphatase: 38 U/L (ref 38–126)
Anion gap: 8 (ref 5–15)
BUN: 20 mg/dL (ref 6–20)
CHLORIDE: 103 mmol/L (ref 101–111)
CO2: 24 mmol/L (ref 22–32)
CREATININE: 0.96 mg/dL (ref 0.61–1.24)
Calcium: 8.9 mg/dL (ref 8.9–10.3)
Glucose, Bld: 111 mg/dL — ABNORMAL HIGH (ref 65–99)
Potassium: 3.9 mmol/L (ref 3.5–5.1)
Sodium: 135 mmol/L (ref 135–145)
Total Bilirubin: 0.6 mg/dL (ref 0.3–1.2)
Total Protein: 6.5 g/dL (ref 6.5–8.1)

## 2018-05-02 LAB — CBC WITH DIFFERENTIAL/PLATELET
BASOS ABS: 0 10*3/uL (ref 0–0.1)
BASOS PCT: 1 %
EOS ABS: 0.1 10*3/uL (ref 0–0.7)
Eosinophils Relative: 4 %
HCT: 38.5 % — ABNORMAL LOW (ref 40.0–52.0)
HEMOGLOBIN: 13.6 g/dL (ref 13.0–18.0)
LYMPHS ABS: 1.2 10*3/uL (ref 1.0–3.6)
Lymphocytes Relative: 31 %
MCH: 33.1 pg (ref 26.0–34.0)
MCHC: 35.3 g/dL (ref 32.0–36.0)
MCV: 93.7 fL (ref 80.0–100.0)
Monocytes Absolute: 0.4 10*3/uL (ref 0.2–1.0)
Monocytes Relative: 10 %
NEUTROS PCT: 56 %
Neutro Abs: 2.2 10*3/uL (ref 1.4–6.5)
Platelets: 151 10*3/uL (ref 150–440)
RBC: 4.1 MIL/uL — AB (ref 4.40–5.90)
RDW: 13.3 % (ref 11.5–14.5)
WBC: 4.1 10*3/uL (ref 3.8–10.6)

## 2018-05-02 MED ORDER — HEPARIN SOD (PORK) LOCK FLUSH 100 UNIT/ML IV SOLN
500.0000 [IU] | Freq: Once | INTRAVENOUS | Status: AC | PRN
  Administered 2018-05-02: 500 [IU]
  Filled 2018-05-02: qty 5

## 2018-05-02 MED ORDER — CYANOCOBALAMIN 1000 MCG/ML IJ SOLN
1000.0000 ug | Freq: Once | INTRAMUSCULAR | Status: AC
  Administered 2018-05-02: 1000 ug via INTRAMUSCULAR
  Filled 2018-05-02: qty 1

## 2018-05-02 MED ORDER — SODIUM CHLORIDE 0.9 % IJ SOLN
10.0000 mL | INTRAMUSCULAR | Status: DC | PRN
  Administered 2018-05-02: 10 mL
  Filled 2018-05-02: qty 10

## 2018-05-02 MED ORDER — SODIUM CHLORIDE 0.9 % IV SOLN
Freq: Once | INTRAVENOUS | Status: AC
  Administered 2018-05-02: 15:00:00 via INTRAVENOUS
  Filled 2018-05-02: qty 1000

## 2018-05-02 MED ORDER — DEXAMETHASONE SODIUM PHOSPHATE 100 MG/10ML IJ SOLN
Freq: Once | INTRAMUSCULAR | Status: AC
  Administered 2018-05-02: 15:00:00 via INTRAVENOUS
  Filled 2018-05-02: qty 4

## 2018-05-02 MED ORDER — SODIUM CHLORIDE 0.9 % IV SOLN
500.0000 mg/m2 | Freq: Once | INTRAVENOUS | Status: AC
  Administered 2018-05-02: 1050 mg via INTRAVENOUS
  Filled 2018-05-02: qty 42

## 2018-05-02 NOTE — Progress Notes (Signed)
[place] OFFICE PROGRESS NOTE  Patient Care Team: [name], MD as PCP - General [facility] Medicine)  Cancer Staging No matching staging information was found for the patient.   Oncology History   # 2012- METASTATIC ADENO CA of LEFT LUNG [acinar pattern] s/p MED LN Bx [NEG- EGFR/K-ras/ALK mutation];[name]; [name]; CT JULY 2016- NED; JAN 2018- NED  # Thoracic Aneurysm [4cm- stable]  ----------------------------------------------------------    DIAGNOSIS: [2012 ] Adenocarcinoma lung  STAGE: 4       ;GOALS: Palliative  CURRENT/MOST RECENT THERAPY '[ ]'  Alimta maintenance      Cancer of hilus of left lung ([place])   07/20/2016 Initial Diagnosis    Cancer of hilus of left lung ([place])         INTERVAL HISTORY:  Mitchell Mosley 57 y.o.  male pleasant patient above history of adenocarcinoma the lung stage IV/currently on maintenance Alimta is here for follow-up.  Continues to complain of mild leg swelling which is mostly dependent.  Review of Systems  Constitutional: Negative for chills, diaphoresis, fever, malaise/fatigue and weight loss.  HENT: Negative for nosebleeds and sore throat.   Eyes: Negative for double vision.  Respiratory: Negative for cough, hemoptysis, sputum production, shortness of breath and wheezing.   Cardiovascular: Positive for leg swelling. Negative for chest pain, palpitations and orthopnea.  Gastrointestinal: Negative for abdominal pain, blood in stool, constipation, diarrhea, heartburn, melena, nausea and vomiting.  Genitourinary: Negative for dysuria, frequency and urgency.  Musculoskeletal: Negative for back pain and joint pain.  Skin: Negative.  Negative for itching and rash.  Neurological: Negative for dizziness, tingling, focal weakness, weakness and headaches.  Endo/Heme/Allergies: Does not bruise/bleed easily.   Psychiatric/Behavioral: Negative for depression. The patient is not nervous/anxious and does not have insomnia.       PAST MEDICAL HISTORY :  Past Medical History:  Diagnosis Date  . Allergy   . Lung cancer ([place])   . Mild valvular heart disease Nov. 2014   per 2 D Echocardiogram done for persistent leg edema, monitored by cardiology  . Peripheral edema     PAST SURGICAL HISTORY :   Past Surgical History:  Procedure Laterality Date  . ELBOW SURGERY    . Left anterior thoracotomy with biopsy  March 2012  . PORTACATH PLACEMENT  March 2012  . TONSILLECTOMY      FAMILY HISTORY :   Family History  Problem Relation Age of Onset  . Leukemia Father   . Diabetes Mother     SOCIAL HISTORY:   Social History   Tobacco Use  . Smoking status: Never Smoker  . Smokeless tobacco: Never Used  Substance Use Topics  . Alcohol use: Yes    Alcohol/week: 0.0 oz    Comment: "social drinker"   . Drug use: No    ALLERGIES:  is allergic to penicillins.  MEDICATIONS:  Current Outpatient Medications  Medication Sig Dispense Refill  . folic acid (FOLVITE) 1 MG tablet TAKE 1 TABLET (1 MG TOTAL) BY MOUTH DAILY. 30 tablet 11  . furosemide (LASIX) 20 MG tablet TAKE 1 TABLET (20 MG TOTAL) BY MOUTH DAILY. ONE TABLET DAILY AS NEEDED FOR SWELLING 90 tablet 1  . loratadine (CLARITIN) 10 MG tablet Take 1 tablet (10 mg total) by mouth daily.    . meloxicam (MOBIC) 7.5 MG tablet Take 7.5 mg by mouth daily.     . Multiple Vitamin (MULTIVITAMIN)  capsule Take 1 capsule by mouth daily. Reported on 02/24/2016    . Omega-3 Fatty Acids (FISH OIL) 1000 MG CAPS Take 1 capsule by mouth daily. Reported on 02/24/2016    . ondansetron (ZOFRAN-ODT) 4 MG disintegrating tablet Take 1 tablet (4 mg total) by mouth every 4 (four) hours as needed for nausea or vomiting. 20 tablet 2  . senna (SENOKOT) 8.6 MG tablet Take 1 tablet by mouth daily. Take as needed around time of chemotherapy    . spironolactone (ALDACTONE) 25 MG  tablet Take 1 tablet (25 mg total) by mouth daily. 90 tablet 3  . tadalafil (CIALIS) 20 MG tablet Take 1 tablet by mouth every 2 days as needed. 6 tablet 11  . vitamin B-12 (CYANOCOBALAMIN) 1000 MCG tablet Take 1,000 mcg by mouth daily.     No current facility-administered medications for this visit.    Facility-Administered Medications Ordered in Other Visits  Medication Dose Route Frequency Provider Last Rate Last Dose  . sodium chloride 0.9 % injection 10 mL  10 mL Intracatheter PRN [name], MD   10 mL at 05/27/15 1543  . sodium chloride 0.9 % injection 10 mL  10 mL Intracatheter PRN [name], MD   10 mL at 06/17/15 1510  . sodium chloride 0.9 % injection 10 mL  10 mL Intravenous PRN [name], MD   10 mL at 07/08/15 1340    PHYSICAL EXAMINATION: ECOG PERFORMANCE STATUS: 0 - Asymptomatic  BP 126/82 (BP Location: Left Arm, Patient Position: Sitting)   Pulse 80   Temp 98.1 F (36.7 C) (Tympanic)   Resp 16   Wt 208 lb 9.6 oz (94.6 kg)   BMI 29.93 kg/m   Filed Weights   05/02/18 1403  Weight: 208 lb 9.6 oz (94.6 kg)    GENERAL: Well-nourished well-developed; Alert, no distress and comfortable.  [name]. EYES: no pallor or icterus OROPHARYNX: no thrush or ulceration; NECK: supple; no lymph nodes felt. LYMPH:  no palpable lymphadenopathy in the axillary or inguinal regions LUNGS: Decreased breath sounds auscultation bilaterally. No wheeze or crackles HEART/CVS: regular rate & rhythm and no murmurs; 1+ bilateral lower extremity edema ABDOMEN:abdomen soft, non-tender and normal bowel sounds. No hepatomegaly or splenomegaly.  Musculoskeletal:no cyanosis of digits and no clubbing  PSYCH: alert & oriented x 3 with fluent speech NEURO: no focal motor/sensory deficits SKIN:  no rashes or significant lesions    LABORATORY DATA:  I have reviewed the data as listed    Component Value Date/Time   NA 135 05/02/2018 0817   NA 138 12/10/2014 1341   K 3.9  05/02/2018 0817   K 3.6 12/10/2014 1341   CL 103 05/02/2018 0817   CL 102 12/10/2014 1341   CO2 24 05/02/2018 0817   CO2 30 12/10/2014 1341   GLUCOSE 111 (H) 05/02/2018 0817   GLUCOSE 160 (H) 12/10/2014 1341   BUN 20 05/02/2018 0817   BUN 21 (H) 12/10/2014 1341   CREATININE 0.96 05/02/2018 0817   CREATININE 1.10 03/25/2015 1453   CALCIUM 8.9 05/02/2018 0817   CALCIUM 8.4 (L) 12/10/2014 1341   PROT 6.5 05/02/2018 0817   PROT 6.6 12/10/2014 1341   ALBUMIN 3.8 05/02/2018 0817   ALBUMIN 3.4 12/10/2014 1341   AST 22 05/02/2018 0817   AST 17 12/10/2014 1341   ALT 15 (L) 05/02/2018 0817   ALT 23 12/10/2014 1341   ALKPHOS 38 05/02/2018 0817   ALKPHOS 50 12/10/2014 1341   BILITOT 0.6 05/02/2018 0817  BILITOT 0.5 12/10/2014 1341   GFRNONAA >60 05/02/2018 0817   GFRNONAA >60 03/25/2015 1453   GFRAA >60 05/02/2018 0817   GFRAA >60 03/25/2015 1453    No results found for: SPEP, UPEP  Lab Results  Component Value Date   WBC 4.1 05/02/2018   NEUTROABS 2.2 05/02/2018   HGB 13.6 05/02/2018   HCT 38.5 (L) 05/02/2018   MCV 93.7 05/02/2018   PLT 151 05/02/2018      Chemistry      Component Value Date/Time   NA 135 05/02/2018 0817   NA 138 12/10/2014 1341   K 3.9 05/02/2018 0817   K 3.6 12/10/2014 1341   CL 103 05/02/2018 0817   CL 102 12/10/2014 1341   CO2 24 05/02/2018 0817   CO2 30 12/10/2014 1341   BUN 20 05/02/2018 0817   BUN 21 (H) 12/10/2014 1341   CREATININE 0.96 05/02/2018 0817   CREATININE 1.10 03/25/2015 1453      Component Value Date/Time   CALCIUM 8.9 05/02/2018 0817   CALCIUM 8.4 (L) 12/10/2014 1341   ALKPHOS 38 05/02/2018 0817   ALKPHOS 50 12/10/2014 1341   AST 22 05/02/2018 0817   AST 17 12/10/2014 1341   ALT 15 (L) 05/02/2018 0817   ALT 23 12/10/2014 1341   BILITOT 0.6 05/02/2018 0817   BILITOT 0.5 12/10/2014 1341       RADIOGRAPHIC STUDIES: I have personally reviewed the radiological images as listed and agreed with the findings in the  report. No results found.   ASSESSMENT & PLAN:  Cancer of hilus of left lung [facility]) #Adenocarcinoma the lung; stage IV; March 2019 CT scan NED.  Currently stable no clinical evidence of progression.  #Proceed with Alimta chemotherapy today. Labs today reviewed;  acceptable for treatment today.   #Bilateral lower extremity swelling-intermittent stable.  Continue diuretics.  #Joint pains/musculoskeletal-on NSAIDs as needed.  Stable  #Patient will need imaging prior to next visit/3 weeks; will defer to clinical trials nurse for follow-up plans.   No orders of the defined types were placed in this encounter.  All questions were answered. The patient knows to call the clinic with any problems, questions or concerns.      [name], MD 05/04/2018 11:37 AM

## 2018-05-02 NOTE — Progress Notes (Signed)
[name] returns to clinic today for consideration of cycle #621 Alimta infusion on the [employer] research study. Reports edema in his BLE remains unchanged and he continues to take Lasix and Spironolactone daily for the edema. Patient does not feel it is related to Alimta, but may increase or decrease spontaneously. He also continues to experience the odd itching in his palms and the bottoms of his feet sporadically. Patient's VS are stable today. His weight is 94.6 kg today and no dose modification is required. [name] also verifies that he is taking Folic Acid and Mobic daily as prescribed, and he receives vitamin B12 injections in clinic every 9 weeks per protocol; which is due today. Patient's glucose level has improved a little at 111 mg/dL non-fasting today. Platelets are normal range today at 151,000. Sodium level is 135 mmol/L. Hct is also a little low at 38.5%. CBC and chemistry levels are otherwise within acceptable parameters for patient to receive his Alimta infusion as scheduled today with no dose modifications. Dr. Rogue Bussing in to examine patient and he denies further leg cramps. His fatigue continues to increase shortly after infusion, then he gradually returns to normal energy level. Patient will continue maintenance Alimta on the S130 protocol. Will schedule next CT scan on 05/19/18 and patient is aware of this. Due to potential conflict, patient has requested that we not schedule his return appointment (due in 3 weeks) until he calls next week. Adverse event list recently updated per study monitor to include several ongoing events that have not yet resolved. Current and ongoing adverse events with grade and attribution are as follows:   Adverse Event Log Study/Protocol: Mitchell Mosley S130 Cycle: 620 Event Grade Onset Date Resolved Date Drug Name Pemetrexed Attribution Treatment Comments  Edema (mild) Grade 1    12/06/2017   Possible  1+ edema in ble up to calf  Fatigue Grade1     01/14/16   Possible    Tendonitis Grade 1    09/21/16   Unrelated  Meloxicam  Pruritis Grade 1     04/19/17   Possible Uses generic lotion with relief deep itching under the skin of palms & feet, new in right upper arm   Elevated cholesterol Grade 1 03/15/14   Unrelated  No ongoing monitoring available  Valvular Heart Disease Grade 1 10/19/13   Unrelated    Erectile Dysfunction Grade 2 12/10/17   Possible Takes Cialis   Skin hyperpigmentation Grade 1 07/08/15   Unrelated  Medial aspect of feel bil  Aneurysm of Ascending Aorta Grade 1 10/14/13   Unrelaed  mentioned on CT scans  Mitchell Mosley, BSN, MHA, OCN 05/02/2018 2:37 PM

## 2018-05-02 NOTE — Assessment & Plan Note (Addendum)
#  Adenocarcinoma the lung; stage IV; March 2019 CT scan NED.  Currently stable no clinical evidence of progression.  #Proceed with Alimta chemotherapy today. Labs today reviewed;  acceptable for treatment today.   #Bilateral lower extremity swelling-intermittent stable.  Continue diuretics.  #Joint pains/musculoskeletal-on NSAIDs as needed.  Stable  #Patient will need imaging prior to next visit/3 weeks; will defer to clinical trials nurse for follow-up plans.

## 2018-05-19 ENCOUNTER — Ambulatory Visit
Admission: RE | Admit: 2018-05-19 | Discharge: 2018-05-19 | Disposition: A | Discharge status: Home / Self Care | Payer: BLUE CROSS/BLUE SHIELD | Source: Ambulatory Visit | Attending: Internal Medicine | Admitting: Internal Medicine

## 2018-05-19 DIAGNOSIS — C3491 Malignant neoplasm of unspecified part of right bronchus or lung: Secondary | ICD-10-CM | POA: Diagnosis not present

## 2018-05-19 DIAGNOSIS — I712 Thoracic aortic aneurysm, without rupture: Secondary | ICD-10-CM | POA: Insufficient documentation

## 2018-05-19 DIAGNOSIS — C3492 Malignant neoplasm of unspecified part of left bronchus or lung: Secondary | ICD-10-CM | POA: Diagnosis not present

## 2018-05-19 MED ORDER — IOPAMIDOL (ISOVUE-300) INJECTION 61%
85.0000 mL | Freq: Once | INTRAVENOUS | Status: AC | PRN
  Administered 2018-05-19: 85 mL via INTRAVENOUS

## 2018-05-25 ENCOUNTER — Telehealth: Payer: Self-pay | Admitting: Internal Medicine

## 2018-05-25 NOTE — Telephone Encounter (Signed)
Please put the patient on my schedule 2:00 june 24th [okay to double book]. Thx GB

## 2018-05-26 ENCOUNTER — Inpatient Hospital Stay: Payer: BLUE CROSS/BLUE SHIELD | Attending: Internal Medicine

## 2018-05-26 ENCOUNTER — Encounter: Payer: Self-pay | Admitting: Internal Medicine

## 2018-05-26 ENCOUNTER — Encounter: Payer: Self-pay | Admitting: *Deleted

## 2018-05-26 ENCOUNTER — Inpatient Hospital Stay (HOSPITAL_BASED_OUTPATIENT_CLINIC_OR_DEPARTMENT_OTHER): Payer: BLUE CROSS/BLUE SHIELD | Admitting: Internal Medicine

## 2018-05-26 ENCOUNTER — Inpatient Hospital Stay: Payer: BLUE CROSS/BLUE SHIELD

## 2018-05-26 ENCOUNTER — Other Ambulatory Visit: Payer: Self-pay

## 2018-05-26 VITALS — BP 130/82 | HR 84 | Temp 97.1°F | Resp 18 | Ht 70.0 in | Wt 212.0 lb

## 2018-05-26 DIAGNOSIS — M2550 Pain in unspecified joint: Secondary | ICD-10-CM | POA: Diagnosis not present

## 2018-05-26 DIAGNOSIS — C3402 Malignant neoplasm of left main bronchus: Secondary | ICD-10-CM | POA: Insufficient documentation

## 2018-05-26 DIAGNOSIS — C3491 Malignant neoplasm of unspecified part of right bronchus or lung: Secondary | ICD-10-CM

## 2018-05-26 DIAGNOSIS — M7989 Other specified soft tissue disorders: Secondary | ICD-10-CM | POA: Diagnosis not present

## 2018-05-26 DIAGNOSIS — R609 Edema, unspecified: Secondary | ICD-10-CM | POA: Diagnosis not present

## 2018-05-26 DIAGNOSIS — Z006 Encounter for examination for normal comparison and control in clinical research program: Secondary | ICD-10-CM | POA: Insufficient documentation

## 2018-05-26 DIAGNOSIS — G8929 Other chronic pain: Secondary | ICD-10-CM | POA: Insufficient documentation

## 2018-05-26 DIAGNOSIS — Z5111 Encounter for antineoplastic chemotherapy: Secondary | ICD-10-CM | POA: Insufficient documentation

## 2018-05-26 LAB — COMPREHENSIVE METABOLIC PANEL
ALT: 18 U/L (ref 17–63)
AST: 27 U/L (ref 15–41)
Albumin: 4 g/dL (ref 3.5–5.0)
Alkaline Phosphatase: 41 U/L (ref 38–126)
Anion gap: 8 (ref 5–15)
BILIRUBIN TOTAL: 0.6 mg/dL (ref 0.3–1.2)
BUN: 17 mg/dL (ref 6–20)
CO2: 24 mmol/L (ref 22–32)
CREATININE: 1.04 mg/dL (ref 0.61–1.24)
Calcium: 8.7 mg/dL — ABNORMAL LOW (ref 8.9–10.3)
Chloride: 104 mmol/L (ref 101–111)
GFR calc Af Amer: >60 mL/min (ref >60)
Glucose, Bld: 180 mg/dL — ABNORMAL HIGH (ref 65–99)
POTASSIUM: 4.1 mmol/L (ref 3.5–5.1)
Sodium: 136 mmol/L (ref 135–145)
TOTAL PROTEIN: 7 g/dL (ref 6.5–8.1)

## 2018-05-26 LAB — CBC WITH DIFFERENTIAL/PLATELET
Basophils Absolute: 0 10*3/uL (ref 0–0.1)
Basophils Relative: 1 %
Eosinophils Absolute: 0.1 10*3/uL (ref 0–0.7)
Eosinophils Relative: 2 %
HEMATOCRIT: 40.9 % (ref 40.0–52.0)
Hemoglobin: 14.4 g/dL (ref 13.0–18.0)
LYMPHS PCT: 27 %
Lymphs Abs: 1.4 10*3/uL (ref 1.0–3.6)
MCH: 32.9 pg (ref 26.0–34.0)
MCHC: 35.2 g/dL (ref 32.0–36.0)
MCV: 93.4 fL (ref 80.0–100.0)
MONO ABS: 0.5 10*3/uL (ref 0.2–1.0)
MONOS PCT: 9 %
NEUTROS ABS: 3.1 10*3/uL (ref 1.4–6.5)
Neutrophils Relative %: 61 %
Platelets: 148 10*3/uL — ABNORMAL LOW (ref 150–440)
RBC: 4.38 MIL/uL — ABNORMAL LOW (ref 4.40–5.90)
RDW: 13.7 % (ref 11.5–14.5)
WBC: 5 10*3/uL (ref 3.8–10.6)

## 2018-05-26 LAB — URINALYSIS, COMPLETE (UACMP) WITH MICROSCOPIC
Bacteria, UA: NONE SEEN
Bilirubin Urine: NEGATIVE
GLUCOSE, UA: NEGATIVE mg/dL
Hgb urine dipstick: NEGATIVE
Ketones, ur: NEGATIVE mg/dL
Leukocytes, UA: NEGATIVE
NITRITE: NEGATIVE
Protein, ur: NEGATIVE mg/dL
Specific Gravity, Urine: 1.008 (ref 1.005–1.030)
Squamous Epithelial / LPF: NONE SEEN (ref 0–5)
WBC, UA: NONE SEEN WBC/hpf (ref 0–5)
pH: 5 (ref 5.0–8.0)

## 2018-05-26 LAB — MAGNESIUM: Magnesium: 1.9 mg/dL (ref 1.7–2.4)

## 2018-05-26 MED ORDER — SODIUM CHLORIDE 0.9 % IV SOLN
Freq: Once | INTRAVENOUS | Status: AC
  Administered 2018-05-26: 15:00:00 via INTRAVENOUS
  Filled 2018-05-26: qty 4

## 2018-05-26 MED ORDER — SODIUM CHLORIDE 0.9% FLUSH
10.0000 mL | INTRAVENOUS | Status: DC | PRN
  Administered 2018-05-26: 10 mL via INTRAVENOUS
  Filled 2018-05-26: qty 10

## 2018-05-26 MED ORDER — SODIUM CHLORIDE 0.9 % IV SOLN
Freq: Once | INTRAVENOUS | Status: AC
  Administered 2018-05-26: 15:00:00 via INTRAVENOUS
  Filled 2018-05-26: qty 1000

## 2018-05-26 MED ORDER — HEPARIN SOD (PORK) LOCK FLUSH 100 UNIT/ML IV SOLN
500.0000 [IU] | Freq: Once | INTRAVENOUS | Status: AC
  Administered 2018-05-26: 500 [IU] via INTRAVENOUS
  Filled 2018-05-26: qty 5

## 2018-05-26 MED ORDER — SODIUM CHLORIDE 0.9 % IV SOLN
500.0000 mg/m2 | Freq: Once | INTRAVENOUS | Status: AC
  Administered 2018-05-26: 1050 mg via INTRAVENOUS
  Filled 2018-05-26: qty 42

## 2018-05-26 MED ORDER — HEPARIN SOD (PORK) LOCK FLUSH 100 UNIT/ML IV SOLN: 500.0000 [IU] | Freq: Once | INTRAVENOUS | Status: DC | PRN

## 2018-05-26 MED ORDER — SODIUM CHLORIDE 0.9 % IJ SOLN
10.0000 mL | INTRAMUSCULAR | Status: DC | PRN
  Filled 2018-05-26: qty 10

## 2018-05-26 NOTE — Progress Notes (Signed)
[place] OFFICE PROGRESS NOTE  Patient Care Team: [name], MD as PCP - General [facility] Medicine)  Cancer Staging No matching staging information was found for the patient.   Oncology History   # 2012- METASTATIC ADENO CA of LEFT LUNG [acinar pattern] s/p MED LN Bx [NEG- EGFR/K-ras/ALK mutation];[name]; [name]; CT JULY 2016- NED; JAN 2018- NED  # Thoracic Aneurysm [4cm- stable]  ----------------------------------------------------------    DIAGNOSIS: [2012 ] Adenocarcinoma lung  STAGE: 4       ;GOALS: Palliative  CURRENT/MOST RECENT THERAPY '[ ]'  Alimta maintenance      Cancer of hilus of left lung ([place])   07/20/2016 Initial Diagnosis    Cancer of hilus of left lung ([facility])         INTERVAL HISTORY:  Mitchell Mosley 57 y.o.  male pleasant patient above history of stage IV adenocarcinoma the lung currently on follow-up.  Alimta maintenance.  Patient denies any shortness of breath or cough.  Chronic mild lower extremity swelling.  Review of Systems  Constitutional: Negative for chills, diaphoresis, fever, malaise/fatigue and weight loss.  HENT: Negative for nosebleeds and sore throat.   Eyes: Negative for double vision.  Respiratory: Negative for cough, hemoptysis, sputum production, shortness of breath and wheezing.   Cardiovascular: Positive for leg swelling. Negative for chest pain, palpitations and orthopnea.  Gastrointestinal: Negative for abdominal pain, blood in stool, constipation, diarrhea, heartburn, melena, nausea and vomiting.  Genitourinary: Negative for dysuria, frequency and urgency.  Musculoskeletal: Negative for back pain and joint pain.  Skin: Negative.  Negative for itching and rash.  Neurological: Negative for dizziness, tingling, focal weakness, weakness and headaches.  Endo/Heme/Allergies: Does not bruise/bleed easily.   Psychiatric/Behavioral: Negative for depression. The patient is not nervous/anxious and does not have insomnia.       PAST MEDICAL HISTORY :  Past Medical History:  Diagnosis Date  . Allergy   . Lung cancer ([place])   . Mild valvular heart disease Nov. 2014   per 2 D Echocardiogram done for persistent leg edema, monitored by cardiology  . Peripheral edema     PAST SURGICAL HISTORY :   Past Surgical History:  Procedure Laterality Date  . ELBOW SURGERY    . Left anterior thoracotomy with biopsy  March 2012  . PORTACATH PLACEMENT  March 2012  . TONSILLECTOMY      FAMILY HISTORY :   Family History  Problem Relation Age of Onset  . Leukemia Father   . Diabetes Mother     SOCIAL HISTORY:   Social History   Tobacco Use  . Smoking status: Never Smoker  . Smokeless tobacco: Never Used  Substance Use Topics  . Alcohol use: Yes    Alcohol/week: 0.0 oz    Comment: "social drinker"   . Drug use: No    ALLERGIES:  is allergic to penicillins.  MEDICATIONS:  Current Outpatient Medications  Medication Sig Dispense Refill  . furosemide (LASIX) 20 MG tablet TAKE 1 TABLET (20 MG TOTAL) BY MOUTH DAILY. ONE TABLET DAILY AS NEEDED FOR SWELLING 90 tablet 1  . loratadine (CLARITIN) 10 MG tablet Take 1 tablet (10 mg total) by mouth daily.    . meloxicam (MOBIC) 7.5 MG tablet Take 7.5 mg by mouth daily.     . Multiple Vitamin (MULTIVITAMIN) capsule Take 1 capsule by mouth daily. Reported on 02/24/2016    . Omega-3 Fatty Acids (FISH  OIL) 1000 MG CAPS Take 1 capsule by mouth daily. Reported on 02/24/2016    . ondansetron (ZOFRAN-ODT) 4 MG disintegrating tablet Take 1 tablet (4 mg total) by mouth every 4 (four) hours as needed for nausea or vomiting. 20 tablet 2  . senna (SENOKOT) 8.6 MG tablet Take 1 tablet by mouth daily. Take as needed around time of chemotherapy    . spironolactone (ALDACTONE) 25 MG tablet Take 1 tablet (25 mg total) by mouth daily. 90 tablet 3  . tadalafil (CIALIS) 20 MG  tablet Take 1 tablet by mouth every 2 days as needed. 6 tablet 11  . vitamin B-12 (CYANOCOBALAMIN) 1000 MCG tablet Take 1,000 mcg by mouth daily.    . folic acid (FOLVITE) 1 MG tablet TAKE 1 TABLET (1 MG TOTAL) BY MOUTH DAILY. 30 tablet 11   No current facility-administered medications for this visit.    Facility-Administered Medications Ordered in Other Visits  Medication Dose Route Frequency Provider Last Rate Last Dose  . sodium chloride 0.9 % injection 10 mL  10 mL Intracatheter PRN [name], MD   10 mL at 05/27/15 1543  . sodium chloride 0.9 % injection 10 mL  10 mL Intracatheter PRN [name], MD   10 mL at 06/17/15 1510  . sodium chloride 0.9 % injection 10 mL  10 mL Intravenous PRN [name], MD   10 mL at 07/08/15 1340    PHYSICAL EXAMINATION: ECOG PERFORMANCE STATUS: 0 - Asymptomatic  BP 130/82   Pulse 84   Temp (!) 97.1 F (36.2 C) (Tympanic)   Resp 18   Ht '5\' 10"'  (1.778 m)   Wt 212 lb (96.2 kg)   BMI 30.42 kg/m   Filed Weights   05/26/18 1411  Weight: 212 lb (96.2 kg)    GENERAL: Well-nourished well-developed; Alert, no distress and comfortable.  Alone. EYES: no pallor or icterus OROPHARYNX: no thrush or ulceration; NECK: supple; no lymph nodes felt. LYMPH:  no palpable lymphadenopathy in the axillary or inguinal regions LUNGS: Decreased breath sounds auscultation bilaterally. No wheeze or crackles HEART/CVS: regular rate & rhythm and no murmurs; mild 1+ swelling bilateral lower extremity edema ABDOMEN:abdomen soft, non-tender and normal bowel sounds. No hepatomegaly or splenomegaly.  Musculoskeletal:no cyanosis of digits and no clubbing  PSYCH: alert & oriented x 3 with fluent speech NEURO: no focal motor/sensory deficits SKIN:  no rashes or significant lesions    LABORATORY DATA:  I have reviewed the data as listed    Component Value Date/Time   NA 136 05/26/2018 1346   NA 138 12/10/2014 1341   K 4.1 05/26/2018 1346   K 3.6  12/10/2014 1341   CL 104 05/26/2018 1346   CL 102 12/10/2014 1341   CO2 24 05/26/2018 1346   CO2 30 12/10/2014 1341   GLUCOSE 180 (H) 05/26/2018 1346   GLUCOSE 160 (H) 12/10/2014 1341   BUN 17 05/26/2018 1346   BUN 21 (H) 12/10/2014 1341   CREATININE 1.04 05/26/2018 1346   CREATININE 1.10 03/25/2015 1453   CALCIUM 8.7 (L) 05/26/2018 1346   CALCIUM 8.4 (L) 12/10/2014 1341   PROT 7.0 05/26/2018 1346   PROT 6.6 12/10/2014 1341   ALBUMIN 4.0 05/26/2018 1346   ALBUMIN 3.4 12/10/2014 1341   AST 27 05/26/2018 1346   AST 17 12/10/2014 1341   ALT 18 05/26/2018 1346   ALT 23 12/10/2014 1341   ALKPHOS 41 05/26/2018 1346   ALKPHOS 50 12/10/2014 1341   BILITOT 0.6 05/26/2018 1346  BILITOT 0.5 12/10/2014 1341   GFRNONAA >60 05/26/2018 1346   GFRNONAA >60 03/25/2015 1453   GFRAA >60 05/26/2018 1346   GFRAA >60 03/25/2015 1453    No results found for: SPEP, UPEP  Lab Results  Component Value Date   WBC 5.0 05/26/2018   NEUTROABS 3.1 05/26/2018   HGB 14.4 05/26/2018   HCT 40.9 05/26/2018   MCV 93.4 05/26/2018   PLT 148 (L) 05/26/2018      Chemistry      Component Value Date/Time   NA 136 05/26/2018 1346   NA 138 12/10/2014 1341   K 4.1 05/26/2018 1346   K 3.6 12/10/2014 1341   CL 104 05/26/2018 1346   CL 102 12/10/2014 1341   CO2 24 05/26/2018 1346   CO2 30 12/10/2014 1341   BUN 17 05/26/2018 1346   BUN 21 (H) 12/10/2014 1341   CREATININE 1.04 05/26/2018 1346   CREATININE 1.10 03/25/2015 1453      Component Value Date/Time   CALCIUM 8.7 (L) 05/26/2018 1346   CALCIUM 8.4 (L) 12/10/2014 1341   ALKPHOS 41 05/26/2018 1346   ALKPHOS 50 12/10/2014 1341   AST 27 05/26/2018 1346   AST 17 12/10/2014 1341   ALT 18 05/26/2018 1346   ALT 23 12/10/2014 1341   BILITOT 0.6 05/26/2018 1346   BILITOT 0.5 12/10/2014 1341       RADIOGRAPHIC STUDIES: I have personally reviewed the radiological images as listed and agreed with the findings in the report. No results found.    ASSESSMENT & PLAN:  Cancer of hilus of left lung ([place]) #Adenocarcinoma the lung; stage IV; june 2019 CT scan NED.  Clinically stable no evidence of any residual disease on the CT scans.  #Continue Alimta chemotherapy; labs adequate.  #Bilateral lower extremity swelling stable; continue Lasix as needed.  #Chronic joint pains-NSAIDs as needed.  Stable  # 3 weeks/ labs/alimta.   # I reviewed the blood work- with the patient in detail; also reviewed the imaging independently [as summarized above]; and with the patient in detail.     No orders of the defined types were placed in this encounter.  All questions were answered. The patient knows to call the clinic with any problems, questions or concerns.      [name], MD 06/01/2018 5:37 PM

## 2018-05-26 NOTE — Assessment & Plan Note (Addendum)
#  Adenocarcinoma the lung; stage IV; june 2019 CT scan NED.  Clinically stable no evidence of any residual disease on the CT scans.  #Continue Alimta chemotherapy; labs adequate.  #Bilateral lower extremity swelling stable; continue Lasix as needed.  #Chronic joint pains-NSAIDs as needed.  Stable  # 3 weeks/ labs/alimta.   # I reviewed the blood work- with the patient in detail; also reviewed the imaging independently [as summarized above]; and with the patient in detail.

## 2018-05-26 NOTE — Progress Notes (Signed)
Mitchell Mosley returns to clinic today for consideration of cycle #622 Alimta infusion on the [employer] research study. Reports edema in his BLE remains unchanged and he presented in clinic wearing sandals rather than tennis shoes to show what his ble normally look like due to edema. He has a 1+ pitting edema in bilateral feet and ankles and reports taking Lasix and Spironolactone daily for the edema. Patient does not feel the swelling is related to Alimta, and states it  may increase or decrease spontaneously. He also continues to experience the odd itching in his palms and the bottoms of his feet sporadically. He reports fatigue continues to increase shortly after infusion, then he gradually returns to normal energy level. Patient's VS remain stable today. His weight is 96.2 kg today and no dose modification for Alimta is required per protocol. [name] also verifies that he is taking Folic Acid and Mobic daily as prescribed, and he receives vitamin B12 injections in clinic every 9 weeks per protocol, due again in 6 weeks. Patient's glucose level is elevated today at 180 mg/dL non-fasting. Platelets are a little low at 148,000. Sodium level is 136 mmol/L. Serum calcium level is slightly low at 8.7mg /dL and corrected calcium is also 8.7mg /dL. CBC and chemistry levels are otherwise within acceptable parameters for patient to receive his Alimta infusion as scheduled today with no dose modifications. Dr. Rogue Bussing in to examine patient and review results of his recent CT scan, which continues to show no disease progression.  Patient will continue maintenance Alimta on the S130 protocol with no dose adjustment at this time. Adverse event list recently updated per study monitor to include several ongoing events that have not yet resolved. Current and ongoing adverse events with grade and attribution are as follows:   Adverse Event Log Study/Protocol: [name] S130 Cycle: 621 Event Grade Onset Date Resolved Date  Drug Name Pemetrexed Attribution Treatment Comments  Edema (mild) Grade 1    12/06/2017   Possible  1+ edema in ble up to calf  Fatigue Grade1    01/14/16   Possible    Tendonitis Grade 1    09/21/16   Unrelated  Meloxicam  Hypocalcemia Grade 1 05/26/18   Possible    Hyperglycemia Grade 1 05/26/18   Unlikely  Non-fasting glucose was 180mg /dL  Thrombocytopenia Grade 1 05/26/18   Possible  Platelet count is 148,000  Pruritis Grade 1     04/19/17   Possible Uses generic lotion with relief deep itching under the skin of palms & feet, new in right upper arm   Elevated cholesterol Grade 1 03/15/14   Unrelated  No ongoing monitoring available  Valvular Heart Disease Grade 1 10/19/13   Unrelated    Erectile Dysfunction Grade 2 12/10/17   Possible Takes Cialis   Skin hyperpigmentation Grade 1 07/08/15   Unrelated  Medial aspect of feel bil  Aneurysm of Ascending Aorta Grade 1 10/14/13   Unrelaed  mentioned on CT scans  Yolande Jolly, BSN, MHA, OCN 05/26/2018 3:22 PM

## 2018-05-29 ENCOUNTER — Other Ambulatory Visit: Payer: Self-pay | Admitting: Internal Medicine

## 2018-05-29 DIAGNOSIS — C3402 Malignant neoplasm of left main bronchus: Secondary | ICD-10-CM

## 2018-06-16 ENCOUNTER — Encounter: Payer: Self-pay | Admitting: *Deleted

## 2018-06-16 ENCOUNTER — Inpatient Hospital Stay: Payer: BLUE CROSS/BLUE SHIELD

## 2018-06-16 ENCOUNTER — Inpatient Hospital Stay: Payer: BLUE CROSS/BLUE SHIELD | Attending: Internal Medicine

## 2018-06-16 ENCOUNTER — Inpatient Hospital Stay (HOSPITAL_BASED_OUTPATIENT_CLINIC_OR_DEPARTMENT_OTHER): Payer: BLUE CROSS/BLUE SHIELD | Admitting: Internal Medicine

## 2018-06-16 VITALS — BP 134/86 | HR 76 | Temp 97.3°F | Resp 16 | Wt 213.6 lb

## 2018-06-16 DIAGNOSIS — Z006 Encounter for examination for normal comparison and control in clinical research program: Secondary | ICD-10-CM

## 2018-06-16 DIAGNOSIS — C3491 Malignant neoplasm of unspecified part of right bronchus or lung: Secondary | ICD-10-CM

## 2018-06-16 DIAGNOSIS — C3402 Malignant neoplasm of left main bronchus: Secondary | ICD-10-CM

## 2018-06-16 DIAGNOSIS — M7989 Other specified soft tissue disorders: Secondary | ICD-10-CM | POA: Insufficient documentation

## 2018-06-16 DIAGNOSIS — G8929 Other chronic pain: Secondary | ICD-10-CM

## 2018-06-16 DIAGNOSIS — Z5112 Encounter for antineoplastic immunotherapy: Secondary | ICD-10-CM | POA: Insufficient documentation

## 2018-06-16 LAB — CBC WITH DIFFERENTIAL/PLATELET
BASOS ABS: 0.1 10*3/uL (ref 0–0.1)
BASOS PCT: 1 %
Eosinophils Absolute: 0.2 10*3/uL (ref 0–0.7)
Eosinophils Relative: 3 %
HEMATOCRIT: 39.9 % — AB (ref 40.0–52.0)
HEMOGLOBIN: 13.9 g/dL (ref 13.0–18.0)
Lymphocytes Relative: 30 %
Lymphs Abs: 1.4 10*3/uL (ref 1.0–3.6)
MCH: 32.5 pg (ref 26.0–34.0)
MCHC: 34.8 g/dL (ref 32.0–36.0)
MCV: 93.3 fL (ref 80.0–100.0)
Monocytes Absolute: 0.4 10*3/uL (ref 0.2–1.0)
Monocytes Relative: 9 %
NEUTROS ABS: 2.7 10*3/uL (ref 1.4–6.5)
NEUTROS PCT: 57 %
Platelets: 172 10*3/uL (ref 150–440)
RBC: 4.27 MIL/uL — AB (ref 4.40–5.90)
RDW: 13.4 % (ref 11.5–14.5)
WBC: 4.8 10*3/uL (ref 3.8–10.6)

## 2018-06-16 LAB — COMPREHENSIVE METABOLIC PANEL
ALT: 16 U/L (ref 0–44)
ANION GAP: 8 (ref 5–15)
AST: 27 U/L (ref 15–41)
Albumin: 3.9 g/dL (ref 3.5–5.0)
Alkaline Phosphatase: 45 U/L (ref 38–126)
BUN: 22 mg/dL — ABNORMAL HIGH (ref 6–20)
CO2: 25 mmol/L (ref 22–32)
Calcium: 9 mg/dL (ref 8.9–10.3)
Chloride: 103 mmol/L (ref 98–111)
Creatinine, Ser: 1.26 mg/dL — ABNORMAL HIGH (ref 0.61–1.24)
Glucose, Bld: 155 mg/dL — ABNORMAL HIGH (ref 70–99)
Potassium: 4 mmol/L (ref 3.5–5.1)
SODIUM: 136 mmol/L (ref 135–145)
TOTAL PROTEIN: 6.9 g/dL (ref 6.5–8.1)
Total Bilirubin: 0.7 mg/dL (ref 0.3–1.2)

## 2018-06-16 MED ORDER — SODIUM CHLORIDE 0.9 % IV SOLN
500.0000 mg/m2 | Freq: Once | INTRAVENOUS | Status: AC
  Administered 2018-06-16: 1050 mg via INTRAVENOUS
  Filled 2018-06-16: qty 42

## 2018-06-16 MED ORDER — SODIUM CHLORIDE 0.9% FLUSH
10.0000 mL | INTRAVENOUS | Status: DC | PRN
  Administered 2018-06-16: 10 mL via INTRAVENOUS
  Filled 2018-06-16: qty 10

## 2018-06-16 MED ORDER — DEXAMETHASONE SODIUM PHOSPHATE 100 MG/10ML IJ SOLN
Freq: Once | INTRAMUSCULAR | Status: AC
  Administered 2018-06-16: 15:00:00 via INTRAVENOUS
  Filled 2018-06-16: qty 4

## 2018-06-16 MED ORDER — HEPARIN SOD (PORK) LOCK FLUSH 100 UNIT/ML IV SOLN
500.0000 [IU] | Freq: Once | INTRAVENOUS | Status: AC
  Administered 2018-06-16: 500 [IU] via INTRAVENOUS

## 2018-06-16 MED ORDER — SODIUM CHLORIDE 0.9 % IJ SOLN
10.0000 mL | INTRAMUSCULAR | Status: DC | PRN
  Filled 2018-06-16: qty 10

## 2018-06-16 MED ORDER — SODIUM CHLORIDE 0.9 % IV SOLN
Freq: Once | INTRAVENOUS | Status: AC
  Administered 2018-06-16: 15:00:00 via INTRAVENOUS
  Filled 2018-06-16: qty 1000

## 2018-06-16 MED ORDER — HEPARIN SOD (PORK) LOCK FLUSH 100 UNIT/ML IV SOLN: 500.0000 [IU] | Freq: Once | INTRAVENOUS | Status: DC | PRN

## 2018-06-16 NOTE — Progress Notes (Signed)
Mitchell Mosley returns to clinic today for consideration of cycle #623 Alimta infusion on the [employer] research study.Continues to experience edema in his BLE  With L>R today. He has trace edema in right ankle and 1+ pitting edema in his left ankle, and reports taking Lasix and Spironolactone daily for the edema. Patient does not feel the swelling is related to Alimta, and states it  may increase or decrease spontaneously, and is almost resolved when he gets up in the mornings. He also continues to experience the odd itching in his palms and the bottoms of his feet sporadically. He reports fatigue continues to increase shortly after infusion and lasts 3-4 days, then he gradually returns to normal energy level. Patient's VS remain stable today. His weight is 96.9 kg today and no dose modification for Alimta is required per protocol. Mitchell Mosley also verifies that he is taking Folic Acid and Mobic daily as prescribed, and he receives vitamin B12 injections in clinic every 9 weeks per protocol, due again in 3 weeks. Patient's glucose level is elevated today at 155 mg/dL non-fasting. Platelets are normal at 172,000. Sodium level is 136 mmol/L. Serum calcium level has also returned to normal at 9.0 mg/dL. Patient's serum creatinine level is slightly above normal at 1.26mg /dL; however calculated CrCl is 89.713. CBC and chemistry levels are otherwise within acceptable parameters for patient to receive his Alimta infusion as scheduled today with no dose modifications. Dr. Rogue Bussing in to examine patient and he will continue maintenance Alimta on the S130 protocol with no dose adjustment at this time. Patient has requested to return to Friday infusion schedule next visit due to side effect of fatigue interfering with his work. Adverse event list recently updated per study monitor to include several ongoing events that have not yet resolved. Current and ongoing adverse events with grade and attribution are as follows:    Adverse Event Log Study/Protocol: [name] S130 Cycle: 622 Event Grade Onset Date Resolved Date Drug Name Pemetrexed Attribution Treatment Comments  Elevated serum creatinine Grade 1 06/16/18   Possible  GFR 89.713  Edema (mild) Grade 1    12/06/2017   Possible  Trace in right and 1+ edema in left ankle  Fatigue Grade1    01/14/16   Possible    Tendonitis Grade 1    09/21/16   Unrelated  Meloxicam  Hypocalcemia Grade 1 05/26/18 06/16/18  Possible  Resolved, Calcium level WNL  Hyperglycemia Grade 1 05/26/18   Unlikely  Non-fasting glucose was 180mg /dL  Thrombocytopenia Grade 1 05/26/18 06/16/18  Possible  Resolved Platelet count now 172,000  Pruritis Grade 1     04/19/17   Possible Uses generic lotion with relief deep itching under the skin of palms & feet, new in right upper arm   Elevated cholesterol Grade 1 03/15/14   Unrelated  No ongoing monitoring available  Valvular Heart Disease Grade 1 10/19/13   Unrelated    Erectile Dysfunction Grade 2 12/10/17   Possible Takes Cialis   Skin hyperpigmentation Grade 1 07/08/15   Unrelated  Medial aspect of feel bil  Aneurysm of Ascending Aorta Grade 1 10/14/13   Unrelaed  mentioned on CT scans  Yolande Jolly, BSN, MHA, OCN 06/16/2018 2:19 PM

## 2018-06-16 NOTE — Progress Notes (Signed)
[place] OFFICE PROGRESS NOTE  Patient Care Team: [name], MD as PCP - General [facility] Medicine)  Cancer Staging No matching staging information was found for the patient.   Oncology History   # 2012- METASTATIC ADENO CA of LEFT LUNG [acinar pattern] s/p MED LN Bx [NEG- EGFR/K-ras/ALK mutation];[name]; [name]; CT JULY 2016- NED; JAN 2018- NED  # Thoracic Aneurysm [4cm- stable]  ----------------------------------------------------------    DIAGNOSIS: [2012 ] Adenocarcinoma lung  STAGE: 4       ;GOALS: Palliative  CURRENT/MOST RECENT THERAPY '[ ]'  Alimta maintenance      Cancer of hilus of left lung ([place])   07/20/2016 Initial Diagnosis    Cancer of hilus of left lung ([place])         INTERVAL HISTORY:  Mitchell Mosley 57 y.o.  male pleasant patient above history of metastatic adenocarcinoma the lung currently on Alimta maintenance is here for follow-up.  Patient denies any unusual cough or shortness of breath.  Chronic mild swelling in the legs not any worse.  No chest pain.  Mild intermittent fatigue just few days post infusion.  Currently resolved.  Review of Systems  Constitutional: Negative for chills, diaphoresis, fever, malaise/fatigue and weight loss.  HENT: Negative for nosebleeds and sore throat.   Eyes: Negative for double vision.  Respiratory: Negative for cough, hemoptysis, sputum production, shortness of breath and wheezing.   Cardiovascular: Negative for chest pain, palpitations, orthopnea and leg swelling.  Gastrointestinal: Negative for abdominal pain, blood in stool, constipation, diarrhea, heartburn, melena, nausea and vomiting.  Genitourinary: Negative for dysuria, frequency and urgency.  Musculoskeletal: Negative for back pain and joint pain.  Skin: Negative.  Negative for itching and rash.  Neurological: Negative for  dizziness, tingling, focal weakness, weakness and headaches.  Endo/Heme/Allergies: Does not bruise/bleed easily.  Psychiatric/Behavioral: Negative for depression. The patient is not nervous/anxious and does not have insomnia.       PAST MEDICAL HISTORY :  Past Medical History:  Diagnosis Date  . Allergy   . Lung cancer ([place])   . Mild valvular heart disease Nov. 2014   per 2 D Echocardiogram done for persistent leg edema, monitored by cardiology  . Peripheral edema     PAST SURGICAL HISTORY :   Past Surgical History:  Procedure Laterality Date  . ELBOW SURGERY    . Left anterior thoracotomy with biopsy  March 2012  . PORTACATH PLACEMENT  March 2012  . TONSILLECTOMY      FAMILY HISTORY :   Family History  Problem Relation Age of Onset  . Leukemia Father   . Diabetes Mother     SOCIAL HISTORY:   Social History   Tobacco Use  . Smoking status: Never Smoker  . Smokeless tobacco: Never Used  Substance Use Topics  . Alcohol use: Yes    Alcohol/week: 0.0 oz    Comment: "social drinker"   . Drug use: No    ALLERGIES:  is allergic to penicillins.  MEDICATIONS:  Current Outpatient Medications  Medication Sig Dispense Refill  . folic acid (FOLVITE) 1 MG tablet TAKE 1 TABLET (1 MG TOTAL) BY MOUTH DAILY. 30 tablet 11  . furosemide (LASIX) 20 MG tablet TAKE 1 TABLET (20 MG TOTAL) BY MOUTH DAILY. ONE TABLET DAILY AS NEEDED FOR SWELLING 90 tablet 1  . loratadine (CLARITIN) 10 MG tablet Take 1 tablet (10 mg total) by mouth daily.    Marland Kitchen  meloxicam (MOBIC) 7.5 MG tablet Take 7.5 mg by mouth daily.     . Multiple Vitamin (MULTIVITAMIN) capsule Take 1 capsule by mouth daily. Reported on 02/24/2016    . Omega-3 Fatty Acids (FISH OIL) 1000 MG CAPS Take 1 capsule by mouth daily. Reported on 02/24/2016    . ondansetron (ZOFRAN-ODT) 4 MG disintegrating tablet Take 1 tablet (4 mg total) by mouth every 4 (four) hours as needed for nausea or vomiting. 20 tablet 2  . senna (SENOKOT) 8.6 MG  tablet Take 1 tablet by mouth daily. Take as needed around time of chemotherapy    . spironolactone (ALDACTONE) 25 MG tablet Take 1 tablet (25 mg total) by mouth daily. 90 tablet 3  . tadalafil (CIALIS) 20 MG tablet Take 1 tablet by mouth every 2 days as needed. 6 tablet 11  . vitamin B-12 (CYANOCOBALAMIN) 1000 MCG tablet Take 1,000 mcg by mouth daily.     No current facility-administered medications for this visit.    Facility-Administered Medications Ordered in Other Visits  Medication Dose Route Frequency Provider Last Rate Last Dose  . sodium chloride 0.9 % injection 10 mL  10 mL Intracatheter PRN [name], MD   10 mL at 05/27/15 1543  . sodium chloride 0.9 % injection 10 mL  10 mL Intracatheter PRN [name], MD   10 mL at 06/17/15 1510  . sodium chloride 0.9 % injection 10 mL  10 mL Intravenous PRN [name], MD   10 mL at 07/08/15 1340    PHYSICAL EXAMINATION: ECOG PERFORMANCE STATUS: 0 - Asymptomatic  BP 134/86 (BP Location: Left Arm, Patient Position: Sitting)   Pulse 76   Temp (!) 97.3 F (36.3 C) (Tympanic)   Resp 16   Wt 213 lb 9.6 oz (96.9 kg)   BMI 30.65 kg/m   Filed Weights   06/16/18 1409  Weight: 213 lb 9.6 oz (96.9 kg)    GENERAL: Well-nourished well-developed; Alert, no distress and comfortable.  [name].Marland Kitchen  EYES: no pallor or icterus OROPHARYNX: no thrush or ulceration; NECK: supple; no lymph nodes felt. LYMPH:  no palpable lymphadenopathy in the axillary or inguinal regions LUNGS: Decreased breath sounds auscultation bilaterally. No wheeze or crackles HEART/CVS: regular rate & rhythm and no murmurs; No lower extremity edema ABDOMEN:abdomen soft, non-tender and normal bowel sounds. No hepatomegaly or splenomegaly.  Musculoskeletal:no cyanosis of digits and no clubbing  PSYCH: alert & oriented x 3 with fluent speech NEURO: no focal motor/sensory deficits SKIN:  no rashes or significant lesions    LABORATORY DATA:  I have reviewed  the data as listed    Component Value Date/Time   NA 136 06/16/2018 1355   NA 138 12/10/2014 1341   K 4.0 06/16/2018 1355   K 3.6 12/10/2014 1341   CL 103 06/16/2018 1355   CL 102 12/10/2014 1341   CO2 25 06/16/2018 1355   CO2 30 12/10/2014 1341   GLUCOSE 155 (H) 06/16/2018 1355   GLUCOSE 160 (H) 12/10/2014 1341   BUN 22 (H) 06/16/2018 1355   BUN 21 (H) 12/10/2014 1341   CREATININE 1.26 (H) 06/16/2018 1355   CREATININE 1.10 03/25/2015 1453   CALCIUM 9.0 06/16/2018 1355   CALCIUM 8.4 (L) 12/10/2014 1341   PROT 6.9 06/16/2018 1355   PROT 6.6 12/10/2014 1341   ALBUMIN 3.9 06/16/2018 1355   ALBUMIN 3.4 12/10/2014 1341   AST 27 06/16/2018 1355   AST 17 12/10/2014 1341   ALT 16 06/16/2018 1355   ALT  23 12/10/2014 1341   ALKPHOS 45 06/16/2018 1355   ALKPHOS 50 12/10/2014 1341   BILITOT 0.7 06/16/2018 1355   BILITOT 0.5 12/10/2014 1341   GFRNONAA >60 06/16/2018 1355   GFRNONAA >60 03/25/2015 1453   GFRAA >60 06/16/2018 1355   GFRAA >60 03/25/2015 1453    No results found for: SPEP, UPEP  Lab Results  Component Value Date   WBC 4.8 06/16/2018   NEUTROABS 2.7 06/16/2018   HGB 13.9 06/16/2018   HCT 39.9 (L) 06/16/2018   MCV 93.3 06/16/2018   PLT 172 06/16/2018      Chemistry      Component Value Date/Time   NA 136 06/16/2018 1355   NA 138 12/10/2014 1341   K 4.0 06/16/2018 1355   K 3.6 12/10/2014 1341   CL 103 06/16/2018 1355   CL 102 12/10/2014 1341   CO2 25 06/16/2018 1355   CO2 30 12/10/2014 1341   BUN 22 (H) 06/16/2018 1355   BUN 21 (H) 12/10/2014 1341   CREATININE 1.26 (H) 06/16/2018 1355   CREATININE 1.10 03/25/2015 1453      Component Value Date/Time   CALCIUM 9.0 06/16/2018 1355   CALCIUM 8.4 (L) 12/10/2014 1341   ALKPHOS 45 06/16/2018 1355   ALKPHOS 50 12/10/2014 1341   AST 27 06/16/2018 1355   AST 17 12/10/2014 1341   ALT 16 06/16/2018 1355   ALT 23 12/10/2014 1341   BILITOT 0.7 06/16/2018 1355   BILITOT 0.5 12/10/2014 1341        RADIOGRAPHIC STUDIES: I have personally reviewed the radiological images as listed and agreed with the findings in the report. No results found.   ASSESSMENT & PLAN:  Cancer of hilus of left lung ([place]) #Adenocarcinoma the lung; stage IV; june 2019 CT scan NED.  Clinically stable no evidence of recurrence/progression.  #Continue Alimta chemotherapy; labs reviewed adequate.  #Bilateral lower extremity swelling-intermittent stable  #Chronic joint pains-NSAIDs as needed.  Stable  # 3 weeks/ labs/alimta.    No orders of the defined types were placed in this encounter.  All questions were answered. The patient knows to call the clinic with any problems, questions or concerns.      [name], MD 06/17/2018 7:01 PM

## 2018-06-16 NOTE — Assessment & Plan Note (Addendum)
#  Adenocarcinoma the lung; stage IV; june 2019 CT scan NED.  Clinically stable no evidence of recurrence/progression.  #Continue Alimta chemotherapy; labs reviewed adequate.  #Bilateral lower extremity swelling-intermittent stable  #Chronic joint pains-NSAIDs as needed.  Stable  # 3 weeks/ labs/alimta.

## 2018-06-30 NOTE — Progress Notes (Signed)
Please note patient's ongoing tendonitis is of the right wrist. Also, the correct onset date of Erectile dysfunction is 12/11/2015. Yolande Jolly, BSN, MHA, OCN 06/30/2018 11:55 AM

## 2018-06-30 NOTE — Progress Notes (Signed)
Please note patient's tendonitis is of the right wrist. Yolande Jolly, BSN, MHA, OCN 06/30/2018 11:54 AM

## 2018-06-30 NOTE — Progress Notes (Signed)
Please note patient's ongoing tendonitis is of the right wrist. Yolande Jolly, BSN, MHA, OCN 06/30/2018 11:55 AM

## 2018-06-30 NOTE — Progress Notes (Signed)
Please note patient's ongoing tendonitis is of the right wrist. Mitchell Mosley, BSN, MHA, OCN 06/30/2018 11:55 AM

## 2018-07-01 NOTE — Progress Notes (Signed)
Please add nausea grade 1 to AE log with onset date of 11/16/2017 and end date of 11/17/17. Yolande Jolly, BSN, MHA, OCN 07/01/2018 9:39 AM

## 2018-07-04 ENCOUNTER — Encounter: Payer: Self-pay | Admitting: Internal Medicine

## 2018-07-04 ENCOUNTER — Encounter: Payer: Self-pay | Admitting: *Deleted

## 2018-07-04 ENCOUNTER — Inpatient Hospital Stay: Payer: BLUE CROSS/BLUE SHIELD

## 2018-07-04 ENCOUNTER — Inpatient Hospital Stay: Payer: BLUE CROSS/BLUE SHIELD | Attending: Internal Medicine

## 2018-07-04 ENCOUNTER — Inpatient Hospital Stay (HOSPITAL_BASED_OUTPATIENT_CLINIC_OR_DEPARTMENT_OTHER): Payer: BLUE CROSS/BLUE SHIELD | Admitting: Internal Medicine

## 2018-07-04 ENCOUNTER — Other Ambulatory Visit: Payer: Self-pay

## 2018-07-04 VITALS — BP 118/82 | HR 77 | Temp 97.3°F | Resp 20 | Ht 70.0 in | Wt 212.5 lb

## 2018-07-04 DIAGNOSIS — Z791 Long term (current) use of non-steroidal anti-inflammatories (NSAID): Secondary | ICD-10-CM | POA: Insufficient documentation

## 2018-07-04 DIAGNOSIS — G8929 Other chronic pain: Secondary | ICD-10-CM | POA: Insufficient documentation

## 2018-07-04 DIAGNOSIS — C3402 Malignant neoplasm of left main bronchus: Secondary | ICD-10-CM | POA: Insufficient documentation

## 2018-07-04 DIAGNOSIS — Z006 Encounter for examination for normal comparison and control in clinical research program: Secondary | ICD-10-CM

## 2018-07-04 DIAGNOSIS — C3490 Malignant neoplasm of unspecified part of unspecified bronchus or lung: Secondary | ICD-10-CM

## 2018-07-04 DIAGNOSIS — M7989 Other specified soft tissue disorders: Secondary | ICD-10-CM | POA: Insufficient documentation

## 2018-07-04 DIAGNOSIS — C3491 Malignant neoplasm of unspecified part of right bronchus or lung: Secondary | ICD-10-CM

## 2018-07-04 LAB — COMPREHENSIVE METABOLIC PANEL
ALBUMIN: 3.8 g/dL (ref 3.5–5.0)
ALT: 17 U/L (ref 0–44)
ANION GAP: 8 (ref 5–15)
AST: 25 U/L (ref 15–41)
Alkaline Phosphatase: 40 U/L (ref 38–126)
BUN: 21 mg/dL — AB (ref 6–20)
CHLORIDE: 104 mmol/L (ref 98–111)
CO2: 24 mmol/L (ref 22–32)
Calcium: 9.1 mg/dL (ref 8.9–10.3)
Creatinine, Ser: 1.23 mg/dL (ref 0.61–1.24)
GFR calc Af Amer: >60 mL/min (ref >60)
GFR calc non Af Amer: >60 mL/min (ref >60)
GLUCOSE: 183 mg/dL — AB (ref 70–99)
POTASSIUM: 3.8 mmol/L (ref 3.5–5.1)
SODIUM: 136 mmol/L (ref 135–145)
Total Bilirubin: 0.7 mg/dL (ref 0.3–1.2)
Total Protein: 6.8 g/dL (ref 6.5–8.1)

## 2018-07-04 LAB — CBC WITH DIFFERENTIAL/PLATELET
BASOS ABS: 0 10*3/uL (ref 0–0.1)
Basophils Relative: 1 %
EOS PCT: 3 %
Eosinophils Absolute: 0.2 10*3/uL (ref 0–0.7)
HEMATOCRIT: 39.5 % — AB (ref 40.0–52.0)
HEMOGLOBIN: 13.5 g/dL (ref 13.0–18.0)
LYMPHS ABS: 1.7 10*3/uL (ref 1.0–3.6)
LYMPHS PCT: 32 %
MCH: 32.2 pg (ref 26.0–34.0)
MCHC: 34 g/dL (ref 32.0–36.0)
MCV: 94.5 fL (ref 80.0–100.0)
MONO ABS: 0.5 10*3/uL (ref 0.2–1.0)
Monocytes Relative: 10 %
NEUTROS ABS: 2.9 10*3/uL (ref 1.4–6.5)
Neutrophils Relative %: 54 %
Platelets: 149 10*3/uL — ABNORMAL LOW (ref 150–440)
RBC: 4.18 MIL/uL — ABNORMAL LOW (ref 4.40–5.90)
RDW: 14 % (ref 11.5–14.5)
WBC: 5.3 10*3/uL (ref 3.8–10.6)

## 2018-07-04 MED ORDER — SODIUM CHLORIDE 0.9% FLUSH
10.0000 mL | Freq: Once | INTRAVENOUS | Status: AC
  Administered 2018-07-04: 10 mL via INTRAVENOUS
  Filled 2018-07-04: qty 10

## 2018-07-04 MED ORDER — SODIUM CHLORIDE 0.9 % IV SOLN
Freq: Once | INTRAVENOUS | Status: AC
  Administered 2018-07-04: 15:00:00 via INTRAVENOUS
  Filled 2018-07-04: qty 4

## 2018-07-04 MED ORDER — SODIUM CHLORIDE 0.9 % IV SOLN
500.0000 mg/m2 | Freq: Once | INTRAVENOUS | Status: AC
  Administered 2018-07-04: 1050 mg via INTRAVENOUS
  Filled 2018-07-04: qty 42

## 2018-07-04 MED ORDER — CYANOCOBALAMIN 1000 MCG/ML IJ SOLN
1000.0000 ug | Freq: Once | INTRAMUSCULAR | Status: AC
  Administered 2018-07-04: 1000 ug via INTRAMUSCULAR
  Filled 2018-07-04: qty 1

## 2018-07-04 MED ORDER — HEPARIN SOD (PORK) LOCK FLUSH 100 UNIT/ML IV SOLN
500.0000 [IU] | Freq: Once | INTRAVENOUS | Status: AC
  Administered 2018-07-04: 500 [IU] via INTRAVENOUS

## 2018-07-04 MED ORDER — HEPARIN SOD (PORK) LOCK FLUSH 100 UNIT/ML IV SOLN
500.0000 [IU] | Freq: Once | INTRAVENOUS | Status: DC | PRN
  Filled 2018-07-04: qty 5

## 2018-07-04 MED ORDER — SODIUM CHLORIDE 0.9 % IV SOLN
Freq: Once | INTRAVENOUS | Status: AC
  Administered 2018-07-04: 14:00:00 via INTRAVENOUS
  Filled 2018-07-04: qty 1000

## 2018-07-04 NOTE — Assessment & Plan Note (Addendum)
#  Adenocarcinoma the lung; stage IV; june 2019 CT scan NED.  Clinically stable no evidence of recurrence/progression.  Stable  # Continue Alimta chemotherapy; labs reviewed adequate.  # Bilateral lower extremity swelling-intermittent stable  #Chronic joint pains-NSAIDs as needed.  Stable  # 3 weeks/ labs/alimta.  Discussed with clinical trials nurse.

## 2018-07-04 NOTE — Progress Notes (Signed)
[place] OFFICE PROGRESS NOTE  Patient Care Team: [name], MD as PCP - General [facility] Medicine)  Cancer Staging No matching staging information was found for the patient.   Oncology History   # 2012- METASTATIC ADENO CA of LEFT LUNG [acinar pattern] s/p MED LN Bx [NEG- EGFR/K-ras/ALK mutation];[name]; [name]; CT JULY 2016- NED; JAN 2018- NED  # Thoracic Aneurysm [4cm- stable]  ----------------------------------------------------------    DIAGNOSIS: [2012 ] Adenocarcinoma lung  STAGE: 4       ;GOALS: Palliative  CURRENT/MOST RECENT THERAPY '[ ]'  Alimta maintenance      Cancer of hilus of left lung ([place])   07/20/2016 Initial Diagnosis    Cancer of hilus of left lung ([facility])       INTERVAL HISTORY:  RHONDA VANGIESON 57 y.o.  male pleasant patient above history of metastatic adenocarcinoma the lung currently on maintenance Alimta is here for follow-up.  Complains of mild to moderate joint pains in the wrist.  Otherwise he is physically active.  Mild leg swelling.  Denies any worsening fatigue.  Mild fatigue.  Review of Systems  Constitutional: Positive for malaise/fatigue. Negative for chills, diaphoresis, fever and weight loss.  HENT: Negative for nosebleeds and sore throat.   Eyes: Negative for double vision.  Respiratory: Negative for cough, hemoptysis, sputum production, shortness of breath and wheezing.   Cardiovascular: Negative for chest pain, palpitations, orthopnea and leg swelling.  Gastrointestinal: Negative for abdominal pain, blood in stool, constipation, diarrhea, heartburn, melena, nausea and vomiting.  Genitourinary: Negative for dysuria, frequency and urgency.  Musculoskeletal: Positive for joint pain. Negative for back pain.  Skin: Negative.  Negative for itching and rash.  Neurological: Negative for dizziness, tingling,  focal weakness, weakness and headaches.  Endo/Heme/Allergies: Does not bruise/bleed easily.  Psychiatric/Behavioral: Negative for depression. The patient is not nervous/anxious and does not have insomnia.       PAST MEDICAL HISTORY :  Past Medical History:  Diagnosis Date  . Allergy   . Lung cancer ([place])   . Mild valvular heart disease Nov. 2014   per 2 D Echocardiogram done for persistent leg edema, monitored by cardiology  . Peripheral edema     PAST SURGICAL HISTORY :   Past Surgical History:  Procedure Laterality Date  . ELBOW SURGERY    . Left anterior thoracotomy with biopsy  March 2012  . PORTACATH PLACEMENT  March 2012  . TONSILLECTOMY      FAMILY HISTORY :   Family History  Problem Relation Age of Onset  . Leukemia Father   . Diabetes Mother     SOCIAL HISTORY:   Social History   Tobacco Use  . Smoking status: Never Smoker  . Smokeless tobacco: Never Used  Substance Use Topics  . Alcohol use: Yes    Alcohol/week: 0.0 standard drinks    Comment: "social drinker"   . Drug use: No    ALLERGIES:  is allergic to penicillins.  MEDICATIONS:  Current Outpatient Medications  Medication Sig Dispense Refill  . folic acid (FOLVITE) 1 MG tablet TAKE 1 TABLET (1 MG TOTAL) BY MOUTH DAILY. 30 tablet 11  . loratadine (CLARITIN) 10 MG tablet Take 1 tablet (10 mg total) by mouth daily.    . meloxicam (MOBIC) 7.5 MG tablet Take 7.5 mg by mouth daily.     . Multiple Vitamin (MULTIVITAMIN) capsule Take 1 capsule by mouth daily. Reported on  02/24/2016    . Omega-3 Fatty Acids (FISH OIL) 1000 MG CAPS Take 1 capsule by mouth daily. Reported on 02/24/2016    . senna (SENOKOT) 8.6 MG tablet Take 1 tablet by mouth daily. Take as needed around time of chemotherapy    . spironolactone (ALDACTONE) 25 MG tablet Take 1 tablet (25 mg total) by mouth daily. 90 tablet 3  . vitamin B-12 (CYANOCOBALAMIN) 1000 MCG tablet Take 1,000 mcg by mouth daily.    . furosemide (LASIX) 20 MG tablet  TAKE 1 TABLET (20 MG TOTAL) BY MOUTH DAILY. ONE TABLET DAILY AS NEEDED FOR SWELLING 90 tablet 1  . tadalafil (ADCIRCA/CIALIS) 20 MG tablet TAKE 1 TABLET BY MOUTH EVERY 2 DAYS AS NEEDED. 6 tablet 11   No current facility-administered medications for this visit.    Facility-Administered Medications Ordered in Other Visits  Medication Dose Route Frequency Provider Last Rate Last Dose  . sodium chloride 0.9 % injection 10 mL  10 mL Intracatheter PRN [name], MD   10 mL at 05/27/15 1543  . sodium chloride 0.9 % injection 10 mL  10 mL Intracatheter PRN [name], MD   10 mL at 06/17/15 1510  . sodium chloride 0.9 % injection 10 mL  10 mL Intravenous PRN [name], MD   10 mL at 07/08/15 1340    PHYSICAL EXAMINATION: ECOG PERFORMANCE STATUS: 0 - Asymptomatic  BP 118/82   Pulse 77   Temp (!) 97.3 F (36.3 C) (Tympanic)   Resp 20   Ht '5\' 10"'  (1.778 m)   Wt 212 lb 8 oz (96.4 kg)   BMI 30.49 kg/m   Filed Weights   07/04/18 1327  Weight: 212 lb 8 oz (96.4 kg)    GENERAL: Well-nourished well-developed; Alert, no distress and comfortable.  Alone. EYES: no pallor or icterus OROPHARYNX: no thrush or ulceration; NECK: supple; no lymph nodes felt. LYMPH:  no palpable lymphadenopathy in the axillary or inguinal regions LUNGS: Decreased breath sounds auscultation bilaterally. No wheeze or crackles HEART/CVS: regular rate & rhythm and no murmurs; No lower extremity edema ABDOMEN:abdomen soft, non-tender and normal bowel sounds. No hepatomegaly or splenomegaly.  Musculoskeletal:no cyanosis of digits and no clubbing  PSYCH: alert & oriented x 3 with fluent speech NEURO: no focal motor/sensory deficits SKIN:  no rashes or significant lesions    LABORATORY DATA:  I have reviewed the data as listed    Component Value Date/Time   NA 135 07/25/2018 1355   NA 138 12/10/2014 1341   K 3.8 07/25/2018 1355   K 3.6 12/10/2014 1341   CL 102 07/25/2018 1355   CL 102 12/10/2014  1341   CO2 26 07/25/2018 1355   CO2 30 12/10/2014 1341   GLUCOSE 119 (H) 07/25/2018 1355   GLUCOSE 160 (H) 12/10/2014 1341   BUN 27 (H) 07/25/2018 1355   BUN 21 (H) 12/10/2014 1341   CREATININE 1.51 (H) 07/25/2018 1355   CREATININE 1.10 03/25/2015 1453   CALCIUM 8.9 07/25/2018 1355   CALCIUM 8.4 (L) 12/10/2014 1341   PROT 6.9 07/25/2018 1355   PROT 6.6 12/10/2014 1341   ALBUMIN 3.9 07/25/2018 1355   ALBUMIN 3.4 12/10/2014 1341   AST 25 07/25/2018 1355   AST 17 12/10/2014 1341   ALT 18 07/25/2018 1355   ALT 23 12/10/2014 1341   ALKPHOS 43 07/25/2018 1355   ALKPHOS 50 12/10/2014 1341   BILITOT 0.8 07/25/2018 1355   BILITOT 0.5 12/10/2014 1341   GFRNONAA 50 (L) 07/25/2018 1355  GFRNONAA >60 03/25/2015 1453   GFRAA 58 (L) 07/25/2018 1355   GFRAA >60 03/25/2015 1453    No results found for: SPEP, UPEP  Lab Results  Component Value Date   WBC 5.2 07/25/2018   NEUTROABS 3.0 07/25/2018   HGB 13.0 07/25/2018   HCT 38.1 (L) 07/25/2018   MCV 95.4 07/25/2018   PLT 160 07/25/2018      Chemistry      Component Value Date/Time   NA 135 07/25/2018 1355   NA 138 12/10/2014 1341   K 3.8 07/25/2018 1355   K 3.6 12/10/2014 1341   CL 102 07/25/2018 1355   CL 102 12/10/2014 1341   CO2 26 07/25/2018 1355   CO2 30 12/10/2014 1341   BUN 27 (H) 07/25/2018 1355   BUN 21 (H) 12/10/2014 1341   CREATININE 1.51 (H) 07/25/2018 1355   CREATININE 1.10 03/25/2015 1453      Component Value Date/Time   CALCIUM 8.9 07/25/2018 1355   CALCIUM 8.4 (L) 12/10/2014 1341   ALKPHOS 43 07/25/2018 1355   ALKPHOS 50 12/10/2014 1341   AST 25 07/25/2018 1355   AST 17 12/10/2014 1341   ALT 18 07/25/2018 1355   ALT 23 12/10/2014 1341   BILITOT 0.8 07/25/2018 1355   BILITOT 0.5 12/10/2014 1341       RADIOGRAPHIC STUDIES: I have personally reviewed the radiological images as listed and agreed with the findings in the report. No results found.   ASSESSMENT & PLAN:  Cancer of hilus of left lung  ([place]) #Adenocarcinoma the lung; stage IV; june 2019 CT scan NED.  Clinically stable no evidence of recurrence/progression.  Stable  # Continue Alimta chemotherapy; labs reviewed adequate.  # Bilateral lower extremity swelling-intermittent stable  #Chronic joint pains-NSAIDs as needed.  Stable  # 3 weeks/ labs/alimta.  Discussed with clinical trials nurse.   No orders of the defined types were placed in this encounter.  All questions were answered. The patient knows to call the clinic with any problems, questions or concerns.      [name], MD 07/28/2018 10:45 PM

## 2018-07-04 NOTE — Progress Notes (Signed)
Mitchell Mosley returns to clinic today for consideration of cycle #624 Alimta infusion on the [employer] research study.Continues to experience edema in his BLE , mostly  ankles, and reports taking Lasix and Spironolactone daily for the edema. Talks with Dr. Rogue Bussing about benefit of wearing TED hose, and possibility of elevating his feet more during the day. Patient does not feel the swelling is related to Alimta, and states it  may increase or decrease spontaneously, and is almost resolved when he gets up in the mornings. He also continues to experience the odd itching in his palms and the bottoms of his feet sporadically. He reports fatigue continues to increase shortly after infusion and lasts 3-4 days, then he gradually returns to normal energy level. Patient's VS remain stable today. His weight is 96.4 kg today and no dose modification for Alimta is required per protocol. Mr. Moquin also verifies that he is taking Folic Acid and Mobic daily as prescribed, and he will receive a vitamin B12 injection in clinic today per protocol. Patient's glucose level is elevated today at 183 mg/dL non-fasting. Platelets are a little low at 149,000. Sodium level is 136 mmol/L. Patient's serum creatinine level is upper normal range 1.23mg /dL. CBC and chemistry levels are otherwise within acceptable parameters for patient to receive his Alimta infusion as scheduled today with no dose modifications. Dr. Rogue Bussing in to examine patient and he will continue maintenance Alimta on the S130 protocol with no dose adjustment at this time. Patient will return for next infusion on 07/25/18. Adverse event list recently updated per study monitor to include several ongoing events that have not yet resolved, and running log of old resolved AEs. Current and ongoing adverse events with grade and attribution are as follows:   Adverse Event Log Study/Protocol: [name] S130 Cycle: 623 Event Grade Onset Date Resolved Date Drug  Name Pemetrexed Attribution Treatment Comments  Elevated serum creatinine Grade 1 06/16/18 07/04/18  Possible  GFR 89.713  Edema (mild) Grade 1  12/06/2017   Possible  Trace in right and 1+ edema in left ankle  Fatigue Grade1  01/14/16   Possible    Tendonitis Grade 1  09/21/16   Unrelated  Meloxicam  Hypocalcemia Grade 1 05/26/18 06/16/18  Possible  Resolved, Calcium level WNL  Hyperglycemia Grade 1 05/26/18 11/09/15  09/08/16  Unlikely  Non-fasting glucose was 183mg /dL  Thrombocytopenia Grade 1 07/04/18 05/26/18 12/06/17 08/02/17  06/16/18 08/02/17  08/23/17  Possible  Platelet count now 149,000  Nausea Grade 1 11/16/17 11/17/17  Unrelated  R/T antibiotic  Abrasions on Right knuckles Grade 1 02/03/18 02/28/18  Unrelated  r/t Dog walking accident  Pruritis Grade 1  04/19/17   Possible Uses generic lotion with relief deep itching under the skin of palms, feet & right upper arm   Elevated cholesterol Grade 1 03/15/14   Unrelated  No ongoing monitoring available  Valvular Heart Disease Grade 1 10/19/13   Unrelated    Erectile Dysfunction Grade 2 12/11/15   Possible Takes Cialis   Skin hyperpigmentation Grade 1 07/08/15   Unrelated  Medial aspect of feel bil  Aneurysm of Ascending Aorta Grade 1 10/14/13   Unrelaed  mentioned on CT scans  Possible arthritis of Left Knee Grade 1 05/06/15   Unrelated    Yolande Jolly, BSN, MHA, OCN 07/04/2018 2:10 PM

## 2018-07-14 ENCOUNTER — Other Ambulatory Visit: Payer: Self-pay | Admitting: Family Medicine

## 2018-07-25 ENCOUNTER — Encounter: Payer: Self-pay | Admitting: *Deleted

## 2018-07-25 ENCOUNTER — Inpatient Hospital Stay: Payer: BLUE CROSS/BLUE SHIELD

## 2018-07-25 ENCOUNTER — Inpatient Hospital Stay (HOSPITAL_BASED_OUTPATIENT_CLINIC_OR_DEPARTMENT_OTHER): Payer: BLUE CROSS/BLUE SHIELD | Admitting: Internal Medicine

## 2018-07-25 ENCOUNTER — Encounter: Payer: Self-pay | Admitting: Internal Medicine

## 2018-07-25 ENCOUNTER — Other Ambulatory Visit: Payer: Self-pay

## 2018-07-25 VITALS — BP 119/78 | HR 73 | Temp 97.9°F | Resp 20 | Ht 70.0 in | Wt 214.4 lb

## 2018-07-25 DIAGNOSIS — G8929 Other chronic pain: Secondary | ICD-10-CM

## 2018-07-25 DIAGNOSIS — C3402 Malignant neoplasm of left main bronchus: Secondary | ICD-10-CM

## 2018-07-25 DIAGNOSIS — Z006 Encounter for examination for normal comparison and control in clinical research program: Secondary | ICD-10-CM

## 2018-07-25 DIAGNOSIS — M7989 Other specified soft tissue disorders: Secondary | ICD-10-CM

## 2018-07-25 DIAGNOSIS — Z791 Long term (current) use of non-steroidal anti-inflammatories (NSAID): Secondary | ICD-10-CM | POA: Diagnosis not present

## 2018-07-25 DIAGNOSIS — C3490 Malignant neoplasm of unspecified part of unspecified bronchus or lung: Secondary | ICD-10-CM

## 2018-07-25 LAB — CBC WITH DIFFERENTIAL/PLATELET
BASOS ABS: 0 10*3/uL (ref 0–0.1)
Basophils Relative: 1 %
Eosinophils Absolute: 0.2 10*3/uL (ref 0–0.7)
Eosinophils Relative: 3 %
HEMATOCRIT: 38.1 % — AB (ref 40.0–52.0)
Hemoglobin: 13 g/dL (ref 13.0–18.0)
Lymphocytes Relative: 29 %
Lymphs Abs: 1.5 10*3/uL (ref 1.0–3.6)
MCH: 32.7 pg (ref 26.0–34.0)
MCHC: 34.2 g/dL (ref 32.0–36.0)
MCV: 95.4 fL (ref 80.0–100.0)
MONO ABS: 0.5 10*3/uL (ref 0.2–1.0)
MONOS PCT: 11 %
NEUTROS ABS: 3 10*3/uL (ref 1.4–6.5)
Neutrophils Relative %: 56 %
PLATELETS: 160 10*3/uL (ref 150–440)
RBC: 3.99 MIL/uL — ABNORMAL LOW (ref 4.40–5.90)
RDW: 14 % (ref 11.5–14.5)
WBC: 5.2 10*3/uL (ref 3.8–10.6)

## 2018-07-25 LAB — URINALYSIS, COMPLETE (UACMP) WITH MICROSCOPIC
BILIRUBIN URINE: NEGATIVE
Bacteria, UA: NONE SEEN
Glucose, UA: NEGATIVE mg/dL
HGB URINE DIPSTICK: NEGATIVE
Ketones, ur: NEGATIVE mg/dL
Leukocytes, UA: NEGATIVE
NITRITE: NEGATIVE
Protein, ur: NEGATIVE mg/dL
SPECIFIC GRAVITY, URINE: 1.006 (ref 1.005–1.030)
Squamous Epithelial / LPF: NONE SEEN (ref 0–5)
WBC, UA: NONE SEEN WBC/hpf (ref 0–5)
pH: 6 (ref 5.0–8.0)

## 2018-07-25 LAB — COMPREHENSIVE METABOLIC PANEL
ALBUMIN: 3.9 g/dL (ref 3.5–5.0)
ALK PHOS: 43 U/L (ref 38–126)
ALT: 18 U/L (ref 0–44)
ANION GAP: 7 (ref 5–15)
AST: 25 U/L (ref 15–41)
BILIRUBIN TOTAL: 0.8 mg/dL (ref 0.3–1.2)
BUN: 27 mg/dL — ABNORMAL HIGH (ref 6–20)
CO2: 26 mmol/L (ref 22–32)
CREATININE: 1.51 mg/dL — AB (ref 0.61–1.24)
Calcium: 8.9 mg/dL (ref 8.9–10.3)
Chloride: 102 mmol/L (ref 98–111)
GFR calc non Af Amer: 50 mL/min — ABNORMAL LOW (ref >60)
GFR, EST AFRICAN AMERICAN: 58 mL/min — AB (ref >60)
GLUCOSE: 119 mg/dL — AB (ref 70–99)
Potassium: 3.8 mmol/L (ref 3.5–5.1)
Sodium: 135 mmol/L (ref 135–145)
TOTAL PROTEIN: 6.9 g/dL (ref 6.5–8.1)

## 2018-07-25 LAB — MAGNESIUM: Magnesium: 1.9 mg/dL (ref 1.7–2.4)

## 2018-07-25 MED ORDER — HEPARIN SOD (PORK) LOCK FLUSH 100 UNIT/ML IV SOLN
500.0000 [IU] | Freq: Once | INTRAVENOUS | Status: AC
  Administered 2018-07-25: 500 [IU] via INTRAVENOUS
  Filled 2018-07-25: qty 5

## 2018-07-25 MED ORDER — HEPARIN SOD (PORK) LOCK FLUSH 100 UNIT/ML IV SOLN: 500.0000 [IU] | Freq: Once | INTRAVENOUS | Status: DC | PRN

## 2018-07-25 MED ORDER — SODIUM CHLORIDE 0.9 % IV SOLN
Freq: Once | INTRAVENOUS | Status: AC
  Administered 2018-07-25: 15:00:00 via INTRAVENOUS
  Filled 2018-07-25: qty 250

## 2018-07-25 MED ORDER — SODIUM CHLORIDE 0.9 % IV SOLN
500.0000 mg/m2 | Freq: Once | INTRAVENOUS | Status: AC
  Administered 2018-07-25: 1050 mg via INTRAVENOUS
  Filled 2018-07-25: qty 42

## 2018-07-25 MED ORDER — SODIUM CHLORIDE 0.9 % IV SOLN
Freq: Once | INTRAVENOUS | Status: AC
  Administered 2018-07-25: 15:00:00 via INTRAVENOUS
  Filled 2018-07-25: qty 4

## 2018-07-25 MED ORDER — SODIUM CHLORIDE 0.9% FLUSH
10.0000 mL | Freq: Once | INTRAVENOUS | Status: AC
  Administered 2018-07-25: 10 mL via INTRAVENOUS
  Filled 2018-07-25: qty 10

## 2018-07-25 NOTE — Progress Notes (Signed)
[place] OFFICE PROGRESS NOTE  Patient Care Team: [name], MD as PCP - [facility])  Cancer Staging No matching staging information was found for the patient.   Oncology History   # 2012- METASTATIC ADENO CA of LEFT LUNG [acinar pattern] s/p MED LN Bx [NEG- EGFR/K-ras/ALK mutation];[name]; [name]; CT JULY 2016- NED; JAN 2018- NED  # Thoracic Aneurysm [4cm- stable]  ----------------------------------------------------------    DIAGNOSIS: [2012 ] Adenocarcinoma lung  STAGE: 4       ;GOALS: Palliative  CURRENT/MOST RECENT THERAPY [ ] Alimta maintenance      Cancer of hilus of left lung ([place])   07/20/2016 Initial Diagnosis    Cancer of hilus of left lung ([place])       INTERVAL HISTORY:  Mitchell Mosley 57 y.o.  male pleasant patient above history of metastatic adenocarcinoma the lung currently on maintenance Alimta is here for follow-up.  Patient denies any unusual fatigue or aches.  Denies any unusual cough or shortness of breath or chest pain.  No nausea no vomiting.  Mild swelling in the legs which is chronic.  Not any worse.  Review of Systems  Constitutional: Negative for chills, diaphoresis, fever, malaise/fatigue and weight loss.  HENT: Negative for nosebleeds and sore throat.   Eyes: Negative for double vision.  Respiratory: Negative for cough, hemoptysis, sputum production, shortness of breath and wheezing.   Cardiovascular: Negative for chest pain, palpitations, orthopnea and leg swelling.  Gastrointestinal: Negative for abdominal pain, blood in stool, constipation, diarrhea, heartburn, melena, nausea and vomiting.  Genitourinary: Negative for dysuria, frequency and urgency.  Musculoskeletal: Negative for back pain and joint pain.  Skin: Negative.  Negative for itching and rash.  Neurological: Negative for dizziness,  tingling, focal weakness, weakness and headaches.  Endo/Heme/Allergies: Does not bruise/bleed easily.  Psychiatric/Behavioral: Negative for depression. The patient is not nervous/anxious and does not have insomnia.       PAST MEDICAL HISTORY :  Past Medical History:  Diagnosis Date  . Allergy   . Lung cancer ([place])   . Mild valvular heart disease Nov. 2014   per 2 D Echocardiogram done for persistent leg edema, monitored by cardiology  . Peripheral edema     PAST SURGICAL HISTORY :   Past Surgical History:  Procedure Laterality Date  . ELBOW SURGERY    . Left anterior thoracotomy with biopsy  March 2012  . PORTACATH PLACEMENT  March 2012  . TONSILLECTOMY      FAMILY HISTORY :   Family History  Problem Relation Age of Onset  . Leukemia Father   . Diabetes Mother     SOCIAL HISTORY:   Social History   Tobacco Use  . Smoking status: Never Smoker  . Smokeless tobacco: Never Used  Substance Use Topics  . Alcohol use: Yes    Alcohol/week: 0.0 standard drinks    Comment: "social drinker"   . Drug use: No    ALLERGIES:  is allergic to penicillins.  MEDICATIONS:  Current Outpatient Medications  Medication Sig Dispense Refill  . folic acid (FOLVITE) 1 MG tablet TAKE 1 TABLET (1 MG TOTAL) BY MOUTH DAILY. 30 tablet 11  . furosemide (LASIX) 20 MG tablet TAKE 1 TABLET (20 MG TOTAL) BY MOUTH DAILY. ONE TABLET DAILY AS NEEDED FOR SWELLING 90 tablet 1  . loratadine (CLARITIN) 10 MG tablet Take 1 tablet (10 mg total) by mouth daily.    Marland Kitchen  meloxicam (MOBIC) 7.5 MG tablet Take 7.5 mg by mouth daily.     . Multiple Vitamin (MULTIVITAMIN) capsule Take 1 capsule by mouth daily. Reported on 02/24/2016    . Omega-3 Fatty Acids (FISH OIL) 1000 MG CAPS Take 1 capsule by mouth daily. Reported on 02/24/2016    . senna (SENOKOT) 8.6 MG tablet Take 1 tablet by mouth daily. Take as needed around time of chemotherapy    . spironolactone (ALDACTONE) 25 MG tablet Take 1 tablet (25 mg total) by  mouth daily. 90 tablet 3  . tadalafil (ADCIRCA/CIALIS) 20 MG tablet TAKE 1 TABLET BY MOUTH EVERY 2 DAYS AS NEEDED. 6 tablet 11  . vitamin B-12 (CYANOCOBALAMIN) 1000 MCG tablet Take 1,000 mcg by mouth daily.     No current facility-administered medications for this visit.    Facility-Administered Medications Ordered in Other Visits  Medication Dose Route Frequency Provider Last Rate Last Dose  . heparin lock flush 100 unit/mL  500 Units Intravenous Once Charlaine Dalton R, [name], MD   10 mL at 05/27/15 1543  . sodium chloride 0.9 % injection 10 mL  10 mL Intracatheter PRN [name], MD   10 mL at 06/17/15 1510  . sodium chloride 0.9 % injection 10 mL  10 mL Intravenous PRN [name], MD   10 mL at 07/08/15 1340    PHYSICAL EXAMINATION: ECOG PERFORMANCE STATUS: 0 - Asymptomatic  BP 119/78   Pulse 73   Temp 97.9 F (36.6 C) (Tympanic)   Resp 20   Ht 5' 10" (1.778 m)   Wt 214 lb 6.4 oz (97.3 kg)   BMI 30.76 kg/m   Filed Weights   07/25/18 1403  Weight: 214 lb 6.4 oz (97.3 kg)    Physical Exam  Constitutional: He is oriented to person, place, and time and well-developed, well-nourished, and in no distress.  HENT:  Head: Normocephalic and atraumatic.  Mouth/Throat: Oropharynx is clear and moist. No oropharyngeal exudate.  Eyes: Pupils are equal, round, and reactive to light.  Neck: Normal range of motion. Neck supple.  Cardiovascular: Normal rate and regular rhythm.  Pulmonary/Chest: No respiratory distress. He has no wheezes.  Abdominal: Soft. Bowel sounds are normal. He exhibits no distension and no mass. There is no tenderness. There is no rebound and no guarding.  Musculoskeletal: Normal range of motion. He exhibits edema. He exhibits no tenderness.  Grade 1 swelling in the legs bilaterally.  Neurological: He is alert and oriented to person, place, and time.  Skin: Skin is warm.   Psychiatric: Affect normal.       LABORATORY DATA:  I have reviewed the data as listed    Component Value Date/Time   NA 136 07/04/2018 1311   NA 138 12/10/2014 1341   K 3.8 07/04/2018 1311   K 3.6 12/10/2014 1341   CL 104 07/04/2018 1311   CL 102 12/10/2014 1341   CO2 24 07/04/2018 1311   CO2 30 12/10/2014 1341   GLUCOSE 183 (H) 07/04/2018 1311   GLUCOSE 160 (H) 12/10/2014 1341   BUN 21 (H) 07/04/2018 1311   BUN 21 (H) 12/10/2014 1341   CREATININE 1.23 07/04/2018 1311   CREATININE 1.10 03/25/2015 1453   CALCIUM 9.1 07/04/2018 1311   CALCIUM 8.4 (L) 12/10/2014 1341   PROT 6.8 07/04/2018 1311   PROT 6.6 12/10/2014 1341   ALBUMIN 3.8 07/04/2018 1311  ALBUMIN 3.4 12/10/2014 1341   AST 25 07/04/2018 1311   AST 17 12/10/2014 1341   ALT 17 07/04/2018 1311   ALT 23 12/10/2014 1341   ALKPHOS 40 07/04/2018 1311   ALKPHOS 50 12/10/2014 1341   BILITOT 0.7 07/04/2018 1311   BILITOT 0.5 12/10/2014 1341   GFRNONAA >60 07/04/2018 1311   GFRNONAA >60 03/25/2015 1453   GFRAA >60 07/04/2018 1311   GFRAA >60 03/25/2015 1453    No results found for: SPEP, UPEP  Lab Results  Component Value Date   WBC 5.2 07/25/2018   NEUTROABS 3.0 07/25/2018   HGB 13.0 07/25/2018   HCT 38.1 (L) 07/25/2018   MCV 95.4 07/25/2018   PLT 160 07/25/2018      Chemistry      Component Value Date/Time   NA 136 07/04/2018 1311   NA 138 12/10/2014 1341   K 3.8 07/04/2018 1311   K 3.6 12/10/2014 1341   CL 104 07/04/2018 1311   CL 102 12/10/2014 1341   CO2 24 07/04/2018 1311   CO2 30 12/10/2014 1341   BUN 21 (H) 07/04/2018 1311   BUN 21 (H) 12/10/2014 1341   CREATININE 1.23 07/04/2018 1311   CREATININE 1.10 03/25/2015 1453      Component Value Date/Time   CALCIUM 9.1 07/04/2018 1311   CALCIUM 8.4 (L) 12/10/2014 1341   ALKPHOS 40 07/04/2018 1311   ALKPHOS 50 12/10/2014 1341   AST 25 07/04/2018 1311   AST 17 12/10/2014 1341   ALT 17 07/04/2018 1311   ALT 23 12/10/2014 1341   BILITOT  0.7 07/04/2018 1311   BILITOT 0.5 12/10/2014 1341       RADIOGRAPHIC STUDIES: I have personally reviewed the radiological images as listed and agreed with the findings in the report. No results found.   ASSESSMENT & PLAN:  Cancer of hilus of left lung ([place]) #Adenocarcinoma the lung; stage IV; june 2019 CT scan NED.  Clinically stable no evidence of recurrence/progression.STABLE.   #Continue Alimta chemotherapy; labs reviewed adequate.  # Bilateral lower extremity swelling-intermittent-STABLE.  #Chronic joint pains-NSAIDs as needed. STABLE.   # 3 weeks/ labs/alimta. Will order CT scan at next visit.    No orders of the defined types were placed in this encounter.  All questions were answered. The patient knows to call the clinic with any problems, questions or concerns.      [name], MD 07/25/2018 2:15 PM

## 2018-07-25 NOTE — Assessment & Plan Note (Addendum)
#  Adenocarcinoma the lung; stage IV; june 2019 CT scan NED.  Clinically stable no evidence of recurrence/progression.STABLE.   #Continue Alimta chemotherapy; labs reviewed adequate.  # Bilateral lower extremity swelling-intermittent-STABLE.  #Chronic joint pains-NSAIDs as needed. STABLE.   # 3 weeks/ labs/alimta. Will order CT scan at next visit.

## 2018-07-25 NOTE — Progress Notes (Signed)
Cr =1.51, MD ok to proceed with treatment today.  Have patient to drink lots of fluids.

## 2018-07-25 NOTE — Progress Notes (Signed)
Mr. Carswell returns to clinic today for consideration of cycle #625 Alimta infusion on the [employer] research study.Continues to experience edema in his BLE , mostly  ankles, and reports taking Lasix and Spironolactone daily for the edema. Patient does not feel the swelling is related to Alimta, and states it  may increase or decrease spontaneously, and is almost resolved when he gets up in the mornings. He also continues to experience the odd itching in his palms and the bottoms of his feet sporadically. He reports fatigue continues to increase shortly after infusion and lasts 3-4 days, then he gradually returns to normal energy level. Patient's VS remain stable today. His weight is 97.3 kg today and no dose modification for Alimta is required per protocol. Mr. Kovalenko also verifies that he is taking Folic Acid and Mobic daily as prescribed. Patient's glucose level is elevated today at 119 mg/dL non-fasting. Platelets are normal today at 160. Sodium level is 135 mmol/L. Patient's serum creatinine level is elevated at 1.51 today and BUN is 27. Dr. Rogue Bussing is aware and advised Mr. Fabel to increase his fluid intake. CBC and chemistry levels are otherwise within acceptable parameters for patient to receive his Alimta infusion as scheduled today with no dose modifications. Dr. Rogue Bussing in to examine patient and he will continue maintenance Alimta on the S130 protocol with no dose adjustment at this time. Patient will return for next infusion on 08/15/18.  His CT scans will be scheduled at this time. All previous adverse events have remained the same for this visit with the exception of the increased BUN/Creatinine. Current and ongoing adverse events with grade and attribution are as follows:   Adverse Event Log Study/Protocol: [name] S130 Cycle: 624 Event Grade Onset Date Resolved Date Drug Name Pemetrexed Attribution Treatment Comments  Elevated serum creatinine Grade 1 06/16/18 07/25/18 07/04/18   Possible  Increase fluids GFR 89.713 GFR 75.17  Edema (mild) Grade 1  12/06/2017   Possible  Trace in right and 1+ edema in left ankle  Fatigue Grade1  01/14/16   Possible    Tendonitis Grade 1  09/21/16   Unrelated  Meloxicam  Hypocalcemia Grade 1 05/26/18 06/16/18  Possible  Resolved, Calcium level WNL  Hyperglycemia Grade 1 05/26/18 11/09/15  09/08/16  Unlikely  Non-fasting glucose was 183mg /dL  Thrombocytopenia Grade 1 07/04/18 05/26/18 12/06/17 08/02/17  06/16/18 08/02/17  08/23/17  Possible  Platelet count now 149,000  Nausea Grade 1 11/16/17 11/17/17  Unrelated  R/T antibiotic  Abrasions on Right knuckles Grade 1 02/03/18 02/28/18  Unrelated  r/t Dog walking accident  Pruritis Grade 1  04/19/17   Possible Uses generic lotion with relief deep itching under the skin of palms, feet & right upper arm   Elevated cholesterol Grade 1 03/15/14   Unrelated  No ongoing monitoring available  Valvular Heart Disease Grade 1 10/19/13   Unrelated    Erectile Dysfunction Grade 2 12/11/15   Possible Takes Cialis   Skin hyperpigmentation Grade 1 07/08/15   Unrelated  Medial aspect of feel bil  Aneurysm of Ascending Aorta Grade 1 10/14/13   Unrelaed  mentioned on CT scans  Possible arthritis of Left Knee Grade 1 05/06/15   Unrelated    Elevated BUN Grade 1 07/25/18   Possible Increase fluids BUN 27  Raynelle Dick, RN BSN "07/25/2018 2:48 PM"

## 2018-07-25 NOTE — Progress Notes (Signed)
BUN and Cr elevated today, ok to proceed with tx, per Dr B informed pt to drink plenty of fluids, pt verbalized understanding.

## 2018-07-28 ENCOUNTER — Other Ambulatory Visit: Payer: Self-pay | Admitting: Internal Medicine

## 2018-08-15 ENCOUNTER — Ambulatory Visit: Payer: BLUE CROSS/BLUE SHIELD

## 2018-08-15 ENCOUNTER — Other Ambulatory Visit: Payer: BLUE CROSS/BLUE SHIELD

## 2018-08-15 ENCOUNTER — Ambulatory Visit: Payer: BLUE CROSS/BLUE SHIELD | Admitting: Internal Medicine

## 2018-08-18 ENCOUNTER — Encounter: Payer: Self-pay | Admitting: Internal Medicine

## 2018-08-18 ENCOUNTER — Inpatient Hospital Stay (HOSPITAL_BASED_OUTPATIENT_CLINIC_OR_DEPARTMENT_OTHER): Payer: BLUE CROSS/BLUE SHIELD | Admitting: Internal Medicine

## 2018-08-18 ENCOUNTER — Inpatient Hospital Stay: Payer: BLUE CROSS/BLUE SHIELD

## 2018-08-18 ENCOUNTER — Encounter: Payer: Self-pay | Admitting: *Deleted

## 2018-08-18 ENCOUNTER — Inpatient Hospital Stay: Payer: BLUE CROSS/BLUE SHIELD | Attending: Internal Medicine

## 2018-08-18 VITALS — BP 148/84 | HR 94 | Temp 97.8°F | Resp 16 | Wt 212.8 lb

## 2018-08-18 DIAGNOSIS — C3402 Malignant neoplasm of left main bronchus: Secondary | ICD-10-CM | POA: Diagnosis not present

## 2018-08-18 DIAGNOSIS — Z5112 Encounter for antineoplastic immunotherapy: Secondary | ICD-10-CM | POA: Diagnosis not present

## 2018-08-18 DIAGNOSIS — G8929 Other chronic pain: Secondary | ICD-10-CM | POA: Insufficient documentation

## 2018-08-18 DIAGNOSIS — C3490 Malignant neoplasm of unspecified part of unspecified bronchus or lung: Secondary | ICD-10-CM

## 2018-08-18 DIAGNOSIS — Z791 Long term (current) use of non-steroidal anti-inflammatories (NSAID): Secondary | ICD-10-CM | POA: Diagnosis not present

## 2018-08-18 DIAGNOSIS — M7989 Other specified soft tissue disorders: Secondary | ICD-10-CM | POA: Insufficient documentation

## 2018-08-18 DIAGNOSIS — Z006 Encounter for examination for normal comparison and control in clinical research program: Secondary | ICD-10-CM | POA: Insufficient documentation

## 2018-08-18 LAB — COMPREHENSIVE METABOLIC PANEL
ALK PHOS: 43 U/L (ref 38–126)
ALT: 18 U/L (ref 0–44)
AST: 28 U/L (ref 15–41)
Albumin: 4.4 g/dL (ref 3.5–5.0)
Anion gap: 10 (ref 5–15)
BILIRUBIN TOTAL: 0.9 mg/dL (ref 0.3–1.2)
BUN: 19 mg/dL (ref 6–20)
CALCIUM: 9.1 mg/dL (ref 8.9–10.3)
CO2: 24 mmol/L (ref 22–32)
CREATININE: 1.27 mg/dL — AB (ref 0.61–1.24)
Chloride: 102 mmol/L (ref 98–111)
GFR calc non Af Amer: >60 mL/min (ref >60)
Glucose, Bld: 137 mg/dL — ABNORMAL HIGH (ref 70–99)
Potassium: 4 mmol/L (ref 3.5–5.1)
SODIUM: 136 mmol/L (ref 135–145)
TOTAL PROTEIN: 7.5 g/dL (ref 6.5–8.1)

## 2018-08-18 LAB — URINALYSIS, COMPLETE (UACMP) WITH MICROSCOPIC
Bacteria, UA: NONE SEEN
Bilirubin Urine: NEGATIVE
GLUCOSE, UA: NEGATIVE mg/dL
HGB URINE DIPSTICK: NEGATIVE
Ketones, ur: NEGATIVE mg/dL
Leukocytes, UA: NEGATIVE
NITRITE: NEGATIVE
PH: 5 (ref 5.0–8.0)
Protein, ur: NEGATIVE mg/dL
SPECIFIC GRAVITY, URINE: 1.009 (ref 1.005–1.030)
Squamous Epithelial / LPF: NONE SEEN (ref 0–5)

## 2018-08-18 LAB — CBC WITH DIFFERENTIAL/PLATELET
BASOS ABS: 0 10*3/uL (ref 0–0.1)
BASOS PCT: 1 %
EOS ABS: 0 10*3/uL (ref 0–0.7)
Eosinophils Relative: 1 %
HCT: 42.6 % (ref 40.0–52.0)
Hemoglobin: 14.5 g/dL (ref 13.0–18.0)
Lymphocytes Relative: 15 %
Lymphs Abs: 1 10*3/uL (ref 1.0–3.6)
MCH: 32.2 pg (ref 26.0–34.0)
MCHC: 34.1 g/dL (ref 32.0–36.0)
MCV: 94.5 fL (ref 80.0–100.0)
MONO ABS: 0.6 10*3/uL (ref 0.2–1.0)
MONOS PCT: 8 %
NEUTROS PCT: 75 %
Neutro Abs: 5 10*3/uL (ref 1.4–6.5)
PLATELETS: 177 10*3/uL (ref 150–440)
RBC: 4.51 MIL/uL (ref 4.40–5.90)
RDW: 13.9 % (ref 11.5–14.5)
WBC: 6.6 10*3/uL (ref 3.8–10.6)

## 2018-08-18 LAB — MAGNESIUM: Magnesium: 1.9 mg/dL (ref 1.7–2.4)

## 2018-08-18 MED ORDER — SODIUM CHLORIDE 0.9 % IV SOLN
Freq: Once | INTRAVENOUS | Status: AC
  Administered 2018-08-18: 14:00:00 via INTRAVENOUS
  Filled 2018-08-18: qty 250

## 2018-08-18 MED ORDER — HEPARIN SOD (PORK) LOCK FLUSH 100 UNIT/ML IV SOLN
500.0000 [IU] | Freq: Once | INTRAVENOUS | Status: AC | PRN
  Administered 2018-08-18: 500 [IU]
  Filled 2018-08-18: qty 5

## 2018-08-18 MED ORDER — SODIUM CHLORIDE 0.9 % IV SOLN
500.0000 mg/m2 | Freq: Once | INTRAVENOUS | Status: AC
  Administered 2018-08-18: 1050 mg via INTRAVENOUS
  Filled 2018-08-18: qty 42

## 2018-08-18 MED ORDER — SODIUM CHLORIDE 0.9 % IV SOLN
Freq: Once | INTRAVENOUS | Status: AC
  Administered 2018-08-18: 15:00:00 via INTRAVENOUS
  Filled 2018-08-18: qty 4

## 2018-08-18 NOTE — Progress Notes (Signed)
Please see first addendum for ongoing adverse events. B/P 142/84. VS otherwise stable. Weight is 96.5kg. Creatinine improved at 1.27. BUN has returned to normal at 19. Glucose 137mg /dL non-fasting. Platelets 177,000. Sodium is normal at 136 mmol/L. Patient denies any new or worsening adverse events/symptoms. He has requested the flu vaccine today and Dr. Rogue Bussing will order this for him. Yolande Jolly, BSN, MHA, OCN 08/18/2018 2:24 PM

## 2018-08-18 NOTE — Progress Notes (Signed)
[place] OFFICE PROGRESS NOTE  Patient Care Team: [name], MD as PCP - General [facility])  Cancer Staging No matching staging information was found for the patient.   Oncology History   # 2012- METASTATIC ADENO CA of LEFT LUNG [acinar pattern] s/p MED LN Bx [NEG- EGFR/K-ras/ALK mutation];[name]; [name]; CT JULY 2016- NED; JAN 2018- NED  # Thoracic Aneurysm [4cm- stable]  ----------------------------------------------------------    DIAGNOSIS: [2012 ] Adenocarcinoma lung  STAGE: 4       ;GOALS: Palliative  CURRENT/MOST RECENT THERAPY '[ ]'  Alimta maintenance      Cancer of hilus of left lung ([place])   07/20/2016 Initial Diagnosis    Cancer of hilus of left lung ([facility])       INTERVAL HISTORY:  Mitchell Mosley 57 y.o.  male pleasant patient above history of metastatic adenocarcinoma the lung currently on maintenance Alimta is here for follow-up.  Patient complains of mild fatigue few days after the infusion.  Otherwise appetite is good.  No weight loss.  No nausea vomiting.  Complains of mild swelling in the legs which are intermittent.  Review of Systems  Constitutional: Negative for chills, diaphoresis, fever, malaise/fatigue and weight loss.  HENT: Negative for nosebleeds and sore throat.   Eyes: Negative for double vision.  Respiratory: Negative for cough, hemoptysis, sputum production, shortness of breath and wheezing.   Cardiovascular: Negative for chest pain, palpitations, orthopnea and leg swelling.  Gastrointestinal: Negative for abdominal pain, blood in stool, constipation, diarrhea, heartburn, melena, nausea and vomiting.  Genitourinary: Negative for dysuria, frequency and urgency.  Musculoskeletal: Negative for back pain and joint pain.  Skin: Negative.  Negative for itching and rash.  Neurological: Negative for dizziness,  tingling, focal weakness, weakness and headaches.  Endo/Heme/Allergies: Does not bruise/bleed easily.  Psychiatric/Behavioral: Negative for depression. The patient is not nervous/anxious and does not have insomnia.       PAST MEDICAL HISTORY :  Past Medical History:  Diagnosis Date  . Allergy   . Lung cancer ([place])   . Mild valvular heart disease Nov. 2014   per 2 D Echocardiogram done for persistent leg edema, monitored by cardiology  . Peripheral edema     PAST SURGICAL HISTORY :   Past Surgical History:  Procedure Laterality Date  . ELBOW SURGERY    . Left anterior thoracotomy with biopsy  March 2012  . PORTACATH PLACEMENT  March 2012  . TONSILLECTOMY      FAMILY HISTORY :   Family History  Problem Relation Age of Onset  . Leukemia Father   . Diabetes Mother     SOCIAL HISTORY:   Social History   Tobacco Use  . Smoking status: Never Smoker  . Smokeless tobacco: Never Used  Substance Use Topics  . Alcohol use: Yes    Alcohol/week: 0.0 standard drinks    Comment: "social drinker"   . Drug use: No    ALLERGIES:  is allergic to penicillins.  MEDICATIONS:  Current Outpatient Medications  Medication Sig Dispense Refill  . folic acid (FOLVITE) 1 MG tablet TAKE 1 TABLET (1 MG TOTAL) BY MOUTH DAILY. 30 tablet 11  . furosemide (LASIX) 20 MG tablet TAKE 1 TABLET (20 MG TOTAL) BY MOUTH DAILY. ONE TABLET DAILY AS NEEDED FOR SWELLING 90 tablet 1  . loratadine (CLARITIN) 10 MG tablet Take 1 tablet (10 mg total) by mouth daily.    Marland Kitchen  meloxicam (MOBIC) 7.5 MG tablet Take 7.5 mg by mouth daily.     . Multiple Vitamin (MULTIVITAMIN) capsule Take 1 capsule by mouth daily. Reported on 02/24/2016    . Omega-3 Fatty Acids (FISH OIL) 1000 MG CAPS Take 1 capsule by mouth daily. Reported on 02/24/2016    . senna (SENOKOT) 8.6 MG tablet Take 1 tablet by mouth daily. Take as needed around time of chemotherapy    . spironolactone (ALDACTONE) 25 MG tablet Take 1 tablet (25 mg total) by  mouth daily. 90 tablet 3  . tadalafil (ADCIRCA/CIALIS) 20 MG tablet TAKE 1 TABLET BY MOUTH EVERY 2 DAYS AS NEEDED. 6 tablet 11  . vitamin B-12 (CYANOCOBALAMIN) 1000 MCG tablet Take 1,000 mcg by mouth daily.     No current facility-administered medications for this visit.    Facility-Administered Medications Ordered in Other Visits  Medication Dose Route Frequency Provider Last Rate Last Dose  . heparin lock flush 100 unit/mL  500 Units Intracatheter Once PRN [name], MD      . INV-PEMEtrexed Lilly S130 (ALIMTA) 1,050 mg in sodium chloride 0.9 % 100 mL chemo infusion  500 mg/m2 (Treatment Plan Recorded) Intravenous Once [name], MD      . ondansetron (ZOFRAN) 8 mg, dexamethasone (DECADRON) 10 mg in sodium chloride 0.9 % 50 mL IVPB   Intravenous Once [name], MD 220 mL/hr at 08/18/18 1436    . sodium chloride 0.9 % injection 10 mL  10 mL Intracatheter PRN [name], MD   10 mL at 05/27/15 1543  . sodium chloride 0.9 % injection 10 mL  10 mL Intracatheter PRN [name], MD   10 mL at 06/17/15 1510  . sodium chloride 0.9 % injection 10 mL  10 mL Intravenous PRN [name], MD   10 mL at 07/08/15 1340    PHYSICAL EXAMINATION: ECOG PERFORMANCE STATUS: 0 - Asymptomatic  BP (!) 148/84 (BP Location: Left Arm, Patient Position: Sitting)   Pulse 94   Temp 97.8 F (36.6 C)   Resp 16   Wt 212 lb 12.8 oz (96.5 kg)   BMI 30.53 kg/m   Filed Weights   08/18/18 1341  Weight: 212 lb 12.8 oz (96.5 kg)    Physical Exam  Constitutional: He is oriented to person, place, and time and well-developed, well-nourished, and in no distress.  HENT:  Head: Normocephalic and atraumatic.  Mouth/Throat: Oropharynx is clear and moist. No oropharyngeal exudate.  Eyes: Pupils are equal, round, and reactive to light.  Neck: Normal range of motion. Neck supple.  Cardiovascular: Normal rate and regular rhythm.  Pulmonary/Chest: No respiratory distress. He has  no wheezes.  Abdominal: Soft. Bowel sounds are normal. He exhibits no distension and no mass. There is no tenderness. There is no rebound and no guarding.  Musculoskeletal: Normal range of motion. He exhibits edema. He exhibits no tenderness.  Grade 1 swelling in the legs bilaterally.  Neurological: He is alert and oriented to person, place, and time.  Skin: Skin is warm.  Psychiatric: Affect normal.       LABORATORY DATA:  I have reviewed the data as listed    Component Value Date/Time   NA 136 08/18/2018 1314   NA 138 12/10/2014 1341   K 4.0 08/18/2018 1314   K 3.6 12/10/2014 1341   CL 102 08/18/2018 1314   CL 102 12/10/2014 1341   CO2 24 08/18/2018 1314   CO2 30 12/10/2014 1341   GLUCOSE 137 (H)  08/18/2018 1314   GLUCOSE 160 (H) 12/10/2014 1341   BUN 19 08/18/2018 1314   BUN 21 (H) 12/10/2014 1341   CREATININE 1.27 (H) 08/18/2018 1314   CREATININE 1.10 03/25/2015 1453   CALCIUM 9.1 08/18/2018 1314   CALCIUM 8.4 (L) 12/10/2014 1341   PROT 7.5 08/18/2018 1314   PROT 6.6 12/10/2014 1341   ALBUMIN 4.4 08/18/2018 1314   ALBUMIN 3.4 12/10/2014 1341   AST 28 08/18/2018 1314   AST 17 12/10/2014 1341   ALT 18 08/18/2018 1314   ALT 23 12/10/2014 1341   ALKPHOS 43 08/18/2018 1314   ALKPHOS 50 12/10/2014 1341   BILITOT 0.9 08/18/2018 1314   BILITOT 0.5 12/10/2014 1341   GFRNONAA >60 08/18/2018 1314   GFRNONAA >60 03/25/2015 1453   GFRAA >60 08/18/2018 1314   GFRAA >60 03/25/2015 1453    No results found for: SPEP, UPEP  Lab Results  Component Value Date   WBC 6.6 08/18/2018   NEUTROABS 5.0 08/18/2018   HGB 14.5 08/18/2018   HCT 42.6 08/18/2018   MCV 94.5 08/18/2018   PLT 177 08/18/2018      Chemistry      Component Value Date/Time   NA 136 08/18/2018 1314   NA 138 12/10/2014 1341   K 4.0 08/18/2018 1314   K 3.6 12/10/2014 1341   CL 102 08/18/2018 1314   CL 102 12/10/2014 1341   CO2 24 08/18/2018 1314   CO2 30 12/10/2014 1341   BUN 19 08/18/2018 1314    BUN 21 (H) 12/10/2014 1341   CREATININE 1.27 (H) 08/18/2018 1314   CREATININE 1.10 03/25/2015 1453      Component Value Date/Time   CALCIUM 9.1 08/18/2018 1314   CALCIUM 8.4 (L) 12/10/2014 1341   ALKPHOS 43 08/18/2018 1314   ALKPHOS 50 12/10/2014 1341   AST 28 08/18/2018 1314   AST 17 12/10/2014 1341   ALT 18 08/18/2018 1314   ALT 23 12/10/2014 1341   BILITOT 0.9 08/18/2018 1314   BILITOT 0.5 12/10/2014 1341       RADIOGRAPHIC STUDIES: I have personally reviewed the radiological images as listed and agreed with the findings in the report. No results found.   ASSESSMENT & PLAN:  Cancer of hilus of left lung ([place]) #Adenocarcinoma the lung; stage IV; june 2019 CT scan NED.  Clinically stable no evidence of recurrence/progression. STABLE.   # Continue Alimta chemotherapy; labs reviewed adequate.  # Bilateral lower extremity swelling-intermittent-STABLE.   #Chronic joint pains-NSAIDs as needed. STABLE.  # Flu shot today.   # 3 weeks/ labs/alimta/MD. CT prior.  Discussed with clinical trials RN.    No orders of the defined types were placed in this encounter.  All questions were answered. The patient knows to call the clinic with any problems, questions or concerns.      [name], MD 08/18/2018 2:41 PM

## 2018-08-18 NOTE — Progress Notes (Signed)
[name] returns to clinic today for consideration of cycle #626 Alimta infusion on the [employer] research study.Continues to experience edema in his BLE , mostly  ankles, and reports taking Lasix and Spironolactone daily for the edema. Patient does not feel the swelling is related to Alimta, and states it  may increase or decrease spontaneously, and is almost resolved when he gets up in the mornings. He also continues to experience the odd itching in his palms and the bottoms of his feet sporadically. He reports fatigue continues to increase shortly after infusion and lasts 3-4 days, then he gradually returns to normal energy level. Patient's VS remain stable today. His weight is 97.3 kg today and no dose modification for Alimta is required per protocol. [name] also verifies that he is taking Folic Acid and Mobic daily as prescribed. Patient's glucose level is elevated today at 119 mg/dL non-fasting. Platelets are normal today at 160. Sodium level is 135 mmol/L. Patient's serum creatinine level is elevated at 1.51 today and BUN is 27. Dr. Rogue Bussing is aware and advised [name] to increase his fluid intake. CBC and chemistry levels are otherwise within acceptable parameters for patient to receive his Alimta infusion as scheduled today with no dose modifications. Dr. Rogue Bussing in to examine patient and he will continue maintenance Alimta on the S130 protocol with no dose adjustment at this time. Patient will return for next infusion on 08/15/18.  His CT scans will be scheduled at this time. All previous adverse events have remained the same for this visit with the exception of the increased BUN/Creatinine. Current and ongoing adverse events with grade and attribution are as follows:   Adverse Event Log Study/Protocol: [name] S130 Cycle: 625 Event Grade Onset Date Resolved Date Drug Name Pemetrexed Attribution Treatment Comments  Elevated serum creatinine Grade 1 06/16/18 07/25/18 07/04/18   Possible  Increase fluids GFR 89.713 GFR 75.17  Edema (mild) Grade 1  12/06/2017   Possible  Trace in right and 1+ edema in left ankle  Fatigue Grade1  01/14/16   Possible    Tendonitis Grade 1  09/21/16   Unrelated  Meloxicam  Hypocalcemia Grade 1 05/26/18 06/16/18  Possible  Resolved, Calcium level WNL  Hyperglycemia Grade 1 05/26/18 11/09/15  09/08/16  Unlikely  Non-fasting glucose was 183mg /dL  Thrombocytopenia Grade 1 07/04/18 05/26/18 12/06/17 08/02/17  06/16/18 08/02/17  08/23/17  Possible  Platelet count now 149,000  Nausea Grade 1 11/16/17 11/17/17  Unrelated  R/T antibiotic  Abrasions on Right knuckles Grade 1 02/03/18 02/28/18  Unrelated  r/t Dog walking accident  Pruritis Grade 1  04/19/17   Possible Uses generic lotion with relief deep itching under the skin of palms, feet & right upper arm   Elevated cholesterol Grade 1 03/15/14   Unrelated  No ongoing monitoring available  Valvular Heart Disease Grade 1 10/19/13   Unrelated    Erectile Dysfunction Grade 2 12/11/15   Possible Takes Cialis   Skin hyperpigmentation Grade 1 07/08/15   Unrelated  Medial aspect of feel bil  Aneurysm of Ascending Aorta Grade 1 10/14/13   Unrelaed  mentioned on CT scans  Possible arthritis of Left Knee Grade 1 05/06/15   Unrelated    Elevated BUN Grade 1 07/25/18   Possible Increase fluids BUN 27

## 2018-08-18 NOTE — Assessment & Plan Note (Addendum)
#  Adenocarcinoma the lung; stage IV; june 2019 CT scan NED.  Clinically stable no evidence of recurrence/progression. STABLE.   # Continue Alimta chemotherapy; labs reviewed adequate.  # Bilateral lower extremity swelling-intermittent-STABLE.   #Chronic joint pains-NSAIDs as needed. STABLE.  # Flu shot today.   # 3 weeks/ labs/alimta/MD. CT prior.  Discussed with clinical trials RN.

## 2018-08-18 NOTE — Progress Notes (Signed)
Addendum created due to original encounter prematurely signed without updating: Mitchell Mosley returns to clinic today for consideration of cycle #626 Alimta infusion on the [employer] research study. Patient continues to experience edema in his BLE , mostly  ankles, and reports he is still taking both Lasix and Spironolactone daily for the edema. Patient does not feel the swelling is related to Alimta, and states it can increase or decrease spontaneously, but is mostly resolved when he gets up in the mornings. He also continues to experience the odd itching in his palms and the bottoms of his feet sporadically. Reports experiencing fatigue shortly after infusion, which lasts 3-4 days, then he gradually returns to normal energy level. Patient's VS remain stable today. His weight is      kg today and no dose modification for Alimta is indicated per protocol. Mitchell Mosley also verifies that he is taking Folic Acid and Mobic daily as prescribed. Patient's glucose level is elevated today at      mg/dL non-fasting. Platelets are normal at    . Sodium level is        mmol/L. Patient's serum creatinine level is     today and BUN is     . Dr. Rogue Bussing is aware and advised Mitchell Mosley to increase his fluid intake. CBC and chemistry levels are otherwise within acceptable parameters for patient to receive his Alimta infusion as scheduled today. Dr. Rogue Bussing in to examine patient and he will continue maintenance Alimta on the S130 protocol with no dose adjustment at this time. Patient will return for next infusion on 09/05/2018 and will receive a Vit. B12 injection at that time. CT of chest and abdomen is scheduled for 09/01/2018. All previous adverse events have remained the same for this visit with the exception of the increased BUN/Creatinine. Current and ongoing adverse events with grade and attribution are as follows:   Adverse Event Log Study/Protocol: Mitchell Mosley S130 Cycle: 625 Event Grade Onset Date Resolved Date  Drug Name Pemetrexed Attribution Treatment Comments  Elevated serum creatinine Grade 1 06/16/18 07/25/18 07/04/18  Possible  Increase fluids GFR 89.713 GFR 75.17  Edema (mild) Grade 1  12/06/2017   Possible  Trace in right and 1+ edema in left ankle  Fatigue Grade1  01/14/16   Possible    Tendonitis Grade 1  09/21/16   Unrelated  Meloxicam  Hypocalcemia Grade 1 05/26/18 06/16/18  Possible  Resolved, Calcium level WNL  Hyperglycemia Grade 1 05/26/18 11/09/15  09/08/16  Unlikely  Non-fasting glucose was 183mg /dL  Thrombocytopenia Grade 1 07/04/18 05/26/18 12/06/17 08/02/17  06/16/18 08/02/17  08/23/17  Possible  Platelet count now 149,000  Nausea Grade 1 11/16/17 11/17/17  Unrelated  R/T antibiotic  Abrasions on Right knuckles Grade 1 02/03/18 02/28/18  Unrelated  r/t Dog walking accident  Pruritis Grade 1  04/19/17   Possible Uses generic lotion with relief deep itching under the skin of palms, feet & right upper arm   Elevated cholesterol Grade 1 03/15/14   Unrelated  No ongoing monitoring available  Valvular Heart Disease Grade 1 10/19/13   Unrelated    Erectile Dysfunction Grade 2 12/11/15   Possible Takes Cialis   Skin hyperpigmentation Grade 1 07/08/15   Unrelated  Medial aspect of feel bil  Aneurysm of Ascending Aorta Grade 1 10/14/13   Unrelaed  mentioned on CT scans  Possible arthritis of Left Knee Grade 1 05/06/15   Unrelated    Elevated BUN Grade 1 07/25/18  Possible Increase fluids BUN 27

## 2018-08-27 DIAGNOSIS — H663X9 Other chronic suppurative otitis media, unspecified ear: Secondary | ICD-10-CM | POA: Diagnosis not present

## 2018-08-27 DIAGNOSIS — H7200 Central perforation of tympanic membrane, unspecified ear: Secondary | ICD-10-CM | POA: Diagnosis not present

## 2018-09-01 ENCOUNTER — Ambulatory Visit
Admission: RE | Admit: 2018-09-01 | Discharge: 2018-09-01 | Disposition: A | Discharge status: Home / Self Care | Payer: BLUE CROSS/BLUE SHIELD | Source: Ambulatory Visit | Attending: Internal Medicine | Admitting: Internal Medicine

## 2018-09-01 DIAGNOSIS — C3490 Malignant neoplasm of unspecified part of unspecified bronchus or lung: Secondary | ICD-10-CM

## 2018-09-01 DIAGNOSIS — C3492 Malignant neoplasm of unspecified part of left bronchus or lung: Secondary | ICD-10-CM | POA: Diagnosis not present

## 2018-09-01 DIAGNOSIS — I700 Atherosclerosis of aorta: Secondary | ICD-10-CM | POA: Insufficient documentation

## 2018-09-01 DIAGNOSIS — Z5111 Encounter for antineoplastic chemotherapy: Secondary | ICD-10-CM | POA: Diagnosis not present

## 2018-09-01 MED ORDER — IOPAMIDOL (ISOVUE-300) INJECTION 61%
85.0000 mL | Freq: Once | INTRAVENOUS | Status: AC | PRN
  Administered 2018-09-01: 85 mL via INTRAVENOUS

## 2018-09-05 ENCOUNTER — Inpatient Hospital Stay (HOSPITAL_BASED_OUTPATIENT_CLINIC_OR_DEPARTMENT_OTHER): Payer: BLUE CROSS/BLUE SHIELD | Admitting: Internal Medicine

## 2018-09-05 ENCOUNTER — Inpatient Hospital Stay: Payer: BLUE CROSS/BLUE SHIELD

## 2018-09-05 ENCOUNTER — Inpatient Hospital Stay: Payer: BLUE CROSS/BLUE SHIELD | Attending: Internal Medicine

## 2018-09-05 ENCOUNTER — Encounter: Payer: Self-pay | Admitting: *Deleted

## 2018-09-05 VITALS — BP 120/76 | HR 87 | Temp 97.2°F | Resp 16 | Wt 214.0 lb

## 2018-09-05 DIAGNOSIS — M7989 Other specified soft tissue disorders: Secondary | ICD-10-CM | POA: Diagnosis not present

## 2018-09-05 DIAGNOSIS — C3402 Malignant neoplasm of left main bronchus: Secondary | ICD-10-CM

## 2018-09-05 DIAGNOSIS — Z006 Encounter for examination for normal comparison and control in clinical research program: Secondary | ICD-10-CM | POA: Insufficient documentation

## 2018-09-05 DIAGNOSIS — Z5111 Encounter for antineoplastic chemotherapy: Secondary | ICD-10-CM | POA: Insufficient documentation

## 2018-09-05 DIAGNOSIS — G8929 Other chronic pain: Secondary | ICD-10-CM

## 2018-09-05 DIAGNOSIS — Z23 Encounter for immunization: Secondary | ICD-10-CM

## 2018-09-05 DIAGNOSIS — C3490 Malignant neoplasm of unspecified part of unspecified bronchus or lung: Secondary | ICD-10-CM

## 2018-09-05 DIAGNOSIS — I712 Thoracic aortic aneurysm, without rupture: Secondary | ICD-10-CM | POA: Insufficient documentation

## 2018-09-05 DIAGNOSIS — Z79899 Other long term (current) drug therapy: Secondary | ICD-10-CM | POA: Diagnosis not present

## 2018-09-05 DIAGNOSIS — R05 Cough: Secondary | ICD-10-CM | POA: Diagnosis not present

## 2018-09-05 LAB — COMPREHENSIVE METABOLIC PANEL
ALBUMIN: 4 g/dL (ref 3.5–5.0)
ALT: 18 U/L (ref 0–44)
ANION GAP: 8 (ref 5–15)
AST: 27 U/L (ref 15–41)
Alkaline Phosphatase: 43 U/L (ref 38–126)
BUN: 19 mg/dL (ref 6–20)
CALCIUM: 9.1 mg/dL (ref 8.9–10.3)
CO2: 26 mmol/L (ref 22–32)
Chloride: 102 mmol/L (ref 98–111)
Creatinine, Ser: 1.18 mg/dL (ref 0.61–1.24)
GFR calc non Af Amer: >60 mL/min (ref >60)
GLUCOSE: 173 mg/dL — AB (ref 70–99)
POTASSIUM: 3.9 mmol/L (ref 3.5–5.1)
SODIUM: 136 mmol/L (ref 135–145)
Total Bilirubin: 0.6 mg/dL (ref 0.3–1.2)
Total Protein: 6.9 g/dL (ref 6.5–8.1)

## 2018-09-05 LAB — CBC WITH DIFFERENTIAL/PLATELET
BASOS ABS: 0 10*3/uL (ref 0–0.1)
BASOS PCT: 0 %
Eosinophils Absolute: 0.1 10*3/uL (ref 0–0.7)
Eosinophils Relative: 3 %
HCT: 39.5 % — ABNORMAL LOW (ref 40.0–52.0)
HEMOGLOBIN: 13.6 g/dL (ref 13.0–18.0)
LYMPHS PCT: 26 %
Lymphs Abs: 1.4 10*3/uL (ref 1.0–3.6)
MCH: 32.8 pg (ref 26.0–34.0)
MCHC: 34.5 g/dL (ref 32.0–36.0)
MCV: 95.1 fL (ref 80.0–100.0)
MONOS PCT: 9 %
Monocytes Absolute: 0.5 10*3/uL (ref 0.2–1.0)
NEUTROS ABS: 3.2 10*3/uL (ref 1.4–6.5)
NEUTROS PCT: 62 %
Platelets: 168 10*3/uL (ref 150–440)
RBC: 4.15 MIL/uL — ABNORMAL LOW (ref 4.40–5.90)
RDW: 13.6 % (ref 11.5–14.5)
WBC: 5.1 10*3/uL (ref 3.8–10.6)

## 2018-09-05 MED ORDER — INFLUENZA VAC SPLIT QUAD 0.5 ML IM SUSY
0.5000 mL | PREFILLED_SYRINGE | Freq: Once | INTRAMUSCULAR | Status: AC
  Administered 2018-09-05: 0.5 mL via INTRAMUSCULAR
  Filled 2018-09-05: qty 0.5

## 2018-09-05 MED ORDER — SODIUM CHLORIDE 0.9 % IV SOLN
Freq: Once | INTRAVENOUS | Status: AC
  Administered 2018-09-05: 15:00:00 via INTRAVENOUS
  Filled 2018-09-05: qty 4

## 2018-09-05 MED ORDER — SODIUM CHLORIDE 0.9 % IV SOLN
Freq: Once | INTRAVENOUS | Status: AC
  Administered 2018-09-05: 14:00:00 via INTRAVENOUS
  Filled 2018-09-05: qty 250

## 2018-09-05 MED ORDER — SODIUM CHLORIDE 0.9 % IV SOLN
500.0000 mg/m2 | Freq: Once | INTRAVENOUS | Status: AC
  Administered 2018-09-05: 1050 mg via INTRAVENOUS
  Filled 2018-09-05: qty 42

## 2018-09-05 MED ORDER — HEPARIN SOD (PORK) LOCK FLUSH 100 UNIT/ML IV SOLN
500.0000 [IU] | Freq: Once | INTRAVENOUS | Status: AC
  Administered 2018-09-05: 500 [IU] via INTRAVENOUS
  Filled 2018-09-05: qty 5

## 2018-09-05 MED ORDER — SODIUM CHLORIDE 0.9% FLUSH
10.0000 mL | Freq: Once | INTRAVENOUS | Status: AC
  Administered 2018-09-05: 10 mL via INTRAVENOUS
  Filled 2018-09-05: qty 10

## 2018-09-05 MED ORDER — CYANOCOBALAMIN 1000 MCG/ML IJ SOLN
1000.0000 ug | Freq: Once | INTRAMUSCULAR | Status: AC
  Administered 2018-09-05: 1000 ug via INTRAMUSCULAR
  Filled 2018-09-05: qty 1

## 2018-09-05 NOTE — Assessment & Plan Note (Addendum)
#  Adenocarcinoma the lung; stage IV; September 30 CT scan chest -subcentimeter right lower lobe peri-bronchial opacities noted-likely inflammatory ; no obvious evidence of recurrence noted.   # Continue Alimta chemotherapy; labs reviewed adequate.  #Right lower lobe subcentimeter nodules/opacities-incidental CT scan.NEW-  Likely inflammatory.  Monitor closely currently asymptomatic hold antibiotics.  Again will review CT scan in 3 months.  # Bilateral lower extremity swelling-intermittent-stable  #Chronic joint pains-NSAIDs as needed.  Stable  # 3 weeks/ labs/alimta/MD.   # I reviewed the blood work- with the patient in detail; also reviewed the imaging independently [as summarized above]; and with the patient in detail.

## 2018-09-05 NOTE — Progress Notes (Signed)
Mr. Dauphinais returns to clinic today for consideration of cycle #627 Alimta infusion on the [employer] research study. Patient continues to experience edema in his BLE , mostly  ankles, and reports he is still taking both Lasix and Spironolactone daily for the edema. Patient does not feel the swelling is related to Alimta, and states it can increase or decrease spontaneously, but is mostly resolved when he gets up in the mornings. He also continues to experience the odd itching in his palms and the bottoms of his feet sporadically. Reports experiencing fatigue shortly after infusion, which lasts 3-4 days, then he gradually returns to normal energy level. Patient's VS remain stable today. His weight is 97.1kg today and no dose modification for Alimta is indicated per protocol. Mr. Rudy also verifies that he is taking Folic Acid and Mobic daily as prescribed. Patient's glucose level is elevated today at 173mg /dL non-fasting. Platelets are normal at 168,000. Sodium level is 127mmol/L. Mr. Nyman is a little better hydrated today than last visit. Patient's serum creatinine level has normalized at 1.18 today and BUN is down to 19. Dr. Rogue Bussing is aware and advised Mr. Hedman to increase his fluid intake. CBC and chemistry levels are otherwise within acceptable parameters for patient to receive his Alimta infusion as scheduled today. Dr. Rogue Bussing in to examine patient and review results of patient's recent CT scan. Although there were a couple of sub-centimeter opacities noted on the scans, Dr. Rogue Bussing reports this is likely due to congestion in the patient's lungs and does not look like solid tumor; but is still too small to characterize. He recommends repeating CT at the regular interval to determine whether this has resolved or worsened. Patient is to call if he develops any symptoms, but at present he denies being symptomatic of any URI. He will continue maintenance Alimta on the S130 protocol with no dose  adjustment at this time. Patient will return for next infusion on 09/26/2018. He will receive a Vit. B12 injection as well as a flu vaccine today. Current and ongoing adverse events with grade and attribution are as follows:   Adverse Event Log Study/Protocol: [name] S130 Cycle: 626 Event Grade Onset Date Resolved Date Drug Name Pemetrexed Attribution Treatment Comments  Elevated serum creatinine Grade 1 06/16/18 07/25/18 07/04/18 09/05/18  Possible  Increase fluids   Edema (mild) Grade 1  12/06/2017   Possible  Trace in right and 1+ edema in left ankle  Fatigue Grade1  01/14/16   Possible    Tendonitis Grade 1  09/21/16   Unrelated  Meloxicam  Hypocalcemia Grade 1 05/26/18 06/16/18  Possible  Resolved, Calcium level WNL  Hyperglycemia Grade 1 05/26/18 11/09/15  09/08/16  Unlikely  Non-fasting glucose was 173mg /dL  Thrombocytopenia Grade 1 07/04/18 05/26/18 12/06/17 08/02/17 07/25/18 06/16/18 08/02/17  08/23/17  Possible  Platelet count now 168,000  Nausea Grade 1 11/16/17 11/17/17  Unrelated  R/T antibiotic  Abrasions on Right knuckles Grade 1 02/03/18 02/28/18  Unrelated  r/t Dog walking accident  Pruritis Grade 1  04/19/17   Possible Uses generic lotion with relief deep itching under the skin of palms, feet & right upper arm   Elevated cholesterol Grade 1 03/15/14   Unrelated  No ongoing monitoring available  Valvular Heart Disease Grade 1 10/19/13   Unrelated    Erectile Dysfunction Grade 2 12/11/15   Possible Takes Cialis   Skin hyperpigmentation Grade 1 07/08/15   Unrelated  Medial aspect of feel bil  Aneurysm of Ascending  Aorta Grade 1 10/14/13   Unrelaed  mentioned on CT scans  Possible arthritis of Left Knee Grade 1 05/06/15   Unrelated    Elevated BUN Grade 1 07/25/18   Possible Increase fluids BUN 27

## 2018-09-05 NOTE — Progress Notes (Signed)
[place] OFFICE PROGRESS NOTE  Patient Care Team: [name], MD as PCP - General [facility] Medicine)  Cancer Staging No matching staging information was found for the patient.   Oncology History   # 2012- METASTATIC ADENO CA of LEFT LUNG [acinar pattern] s/p MED LN Bx [NEG- EGFR/K-ras/ALK mutation];[name]; [name]; CT JULY 2016- NED; JAN 2018- NED  # Thoracic Aneurysm [4cm- stable]  ----------------------------------------------------------    DIAGNOSIS: [2012 ] Adenocarcinoma lung  STAGE: 4       ;GOALS: Palliative  CURRENT/MOST RECENT THERAPY '[ ]'  Alimta maintenance      Cancer of hilus of left lung ([place])   07/20/2016 Initial Diagnosis    Cancer of hilus of left lung (HCC)       INTERVAL HISTORY:  Mitchell Mosley 57 y.o.  male pleasant patient above history of metastatic adenocarcinoma the lung currently on maintenance Alimta is here for follow-up/review the results of the restaging CAT scan.  Patient continues to complain of mild swelling in the legs.  Continues to be on intermittent diuretics.  Denies any nausea vomiting headache.  Denies any unusual cough or chest pain.   Review of Systems  Constitutional: Negative for chills, diaphoresis, fever, malaise/fatigue and weight loss.  HENT: Negative for nosebleeds and sore throat.   Eyes: Negative for double vision.  Respiratory: Negative for cough, hemoptysis, sputum production, shortness of breath and wheezing.   Cardiovascular: Positive for leg swelling. Negative for chest pain, palpitations and orthopnea.  Gastrointestinal: Negative for abdominal pain, blood in stool, constipation, diarrhea, heartburn, melena, nausea and vomiting.  Genitourinary: Negative for dysuria, frequency and urgency.  Musculoskeletal: Negative for back pain and joint pain.  Skin: Negative.  Negative for itching  and rash.  Neurological: Negative for dizziness, tingling, focal weakness, weakness and headaches.  Endo/Heme/Allergies: Does not bruise/bleed easily.  Psychiatric/Behavioral: Negative for depression. The patient is not nervous/anxious and does not have insomnia.       PAST MEDICAL HISTORY :  Past Medical History:  Diagnosis Date  . Allergy   . Lung cancer ([place])   . Mild valvular heart disease Nov. 2014   per 2 D Echocardiogram done for persistent leg edema, monitored by cardiology  . Peripheral edema     PAST SURGICAL HISTORY :   Past Surgical History:  Procedure Laterality Date  . ELBOW SURGERY    . Left anterior thoracotomy with biopsy  March 2012  . PORTACATH PLACEMENT  March 2012  . TONSILLECTOMY      FAMILY HISTORY :   Family History  Problem Relation Age of Onset  . Leukemia Father   . Diabetes Mother     SOCIAL HISTORY:   Social History   Tobacco Use  . Smoking status: Never Smoker  . Smokeless tobacco: Never Used  Substance Use Topics  . Alcohol use: Yes    Alcohol/week: 0.0 standard drinks    Comment: "social drinker"   . Drug use: No    ALLERGIES:  is allergic to penicillins.  MEDICATIONS:  Current Outpatient Medications  Medication Sig Dispense Refill  . folic acid (FOLVITE) 1 MG tablet TAKE 1 TABLET (1 MG TOTAL) BY MOUTH DAILY. 30 tablet 11  . furosemide (LASIX) 20 MG tablet TAKE 1 TABLET (20 MG TOTAL) BY MOUTH DAILY. ONE TABLET DAILY AS NEEDED FOR SWELLING 90 tablet 1  . loratadine (CLARITIN) 10 MG tablet Take 1 tablet (10 mg  total) by mouth daily.    . meloxicam (MOBIC) 7.5 MG tablet Take 7.5 mg by mouth daily.     . Multiple Vitamin (MULTIVITAMIN) capsule Take 1 capsule by mouth daily. Reported on 02/24/2016    . Omega-3 Fatty Acids (FISH OIL) 1000 MG CAPS Take 1 capsule by mouth daily. Reported on 02/24/2016    . senna (SENOKOT) 8.6 MG tablet Take 1 tablet by mouth daily. Take as needed around time of chemotherapy    . spironolactone  (ALDACTONE) 25 MG tablet Take 1 tablet (25 mg total) by mouth daily. 90 tablet 3  . tadalafil (ADCIRCA/CIALIS) 20 MG tablet TAKE 1 TABLET BY MOUTH EVERY 2 DAYS AS NEEDED. 6 tablet 11  . vitamin B-12 (CYANOCOBALAMIN) 1000 MCG tablet Take 1,000 mcg by mouth daily.     No current facility-administered medications for this visit.    Facility-Administered Medications Ordered in Other Visits  Medication Dose Route Frequency Provider Last Rate Last Dose  . sodium chloride 0.9 % injection 10 mL  10 mL Intracatheter PRN [name], MD   10 mL at 05/27/15 1543  . sodium chloride 0.9 % injection 10 mL  10 mL Intracatheter PRN [name], MD   10 mL at 06/17/15 1510  . sodium chloride 0.9 % injection 10 mL  10 mL Intravenous PRN [name], MD   10 mL at 07/08/15 1340    PHYSICAL EXAMINATION: ECOG PERFORMANCE STATUS: 0 - Asymptomatic  BP 120/76 (BP Location: Left Arm, Patient Position: Sitting)   Pulse 87   Temp (!) 97.2 F (36.2 C) (Tympanic)   Resp 16   Wt 214 lb (97.1 kg)   BMI 30.71 kg/m   Filed Weights   09/05/18 1347  Weight: 214 lb (97.1 kg)    Physical Exam  Constitutional: He is oriented to person, place, and time and well-developed, well-nourished, and in no distress.  HENT:  Head: Normocephalic and atraumatic.  Mouth/Throat: Oropharynx is clear and moist. No oropharyngeal exudate.  Eyes: Pupils are equal, round, and reactive to light.  Neck: Normal range of motion. Neck supple.  Cardiovascular: Normal rate and regular rhythm.  Pulmonary/Chest: No respiratory distress. He has no wheezes.  Abdominal: Soft. Bowel sounds are normal. He exhibits no distension and no mass. There is no tenderness. There is no rebound and no guarding.  Musculoskeletal: Normal range of motion. He exhibits edema. He exhibits no tenderness.  Grade 1 swelling in the legs bilaterally.  Neurological: He is alert and oriented to person, place, and time.  Skin: Skin is warm.  Psychiatric:  Affect normal.       LABORATORY DATA:  I have reviewed the data as listed    Component Value Date/Time   NA 136 09/05/2018 1310   NA 138 12/10/2014 1341   K 3.9 09/05/2018 1310   K 3.6 12/10/2014 1341   CL 102 09/05/2018 1310   CL 102 12/10/2014 1341   CO2 26 09/05/2018 1310   CO2 30 12/10/2014 1341   GLUCOSE 173 (H) 09/05/2018 1310   GLUCOSE 160 (H) 12/10/2014 1341   BUN 19 09/05/2018 1310   BUN 21 (H) 12/10/2014 1341   CREATININE 1.18 09/05/2018 1310   CREATININE 1.10 03/25/2015 1453   CALCIUM 9.1 09/05/2018 1310   CALCIUM 8.4 (L) 12/10/2014 1341   PROT 6.9 09/05/2018 1310   PROT 6.6 12/10/2014 1341   ALBUMIN 4.0 09/05/2018 1310   ALBUMIN 3.4 12/10/2014 1341   AST 27 09/05/2018 1310   AST 17 12/10/2014  1341   ALT 18 09/05/2018 1310   ALT 23 12/10/2014 1341   ALKPHOS 43 09/05/2018 1310   ALKPHOS 50 12/10/2014 1341   BILITOT 0.6 09/05/2018 1310   BILITOT 0.5 12/10/2014 1341   GFRNONAA >60 09/05/2018 1310   GFRNONAA >60 03/25/2015 1453   GFRAA >60 09/05/2018 1310   GFRAA >60 03/25/2015 1453    No results found for: SPEP, UPEP  Lab Results  Component Value Date   WBC 5.1 09/05/2018   NEUTROABS 3.2 09/05/2018   HGB 13.6 09/05/2018   HCT 39.5 (L) 09/05/2018   MCV 95.1 09/05/2018   PLT 168 09/05/2018      Chemistry      Component Value Date/Time   NA 136 09/05/2018 1310   NA 138 12/10/2014 1341   K 3.9 09/05/2018 1310   K 3.6 12/10/2014 1341   CL 102 09/05/2018 1310   CL 102 12/10/2014 1341   CO2 26 09/05/2018 1310   CO2 30 12/10/2014 1341   BUN 19 09/05/2018 1310   BUN 21 (H) 12/10/2014 1341   CREATININE 1.18 09/05/2018 1310   CREATININE 1.10 03/25/2015 1453      Component Value Date/Time   CALCIUM 9.1 09/05/2018 1310   CALCIUM 8.4 (L) 12/10/2014 1341   ALKPHOS 43 09/05/2018 1310   ALKPHOS 50 12/10/2014 1341   AST 27 09/05/2018 1310   AST 17 12/10/2014 1341   ALT 18 09/05/2018 1310   ALT 23 12/10/2014 1341   BILITOT 0.6 09/05/2018 1310    BILITOT 0.5 12/10/2014 1341       RADIOGRAPHIC STUDIES: I have personally reviewed the radiological images as listed and agreed with the findings in the report. No results found.   ASSESSMENT & PLAN:  Cancer of hilus of left lung (HCC) #Adenocarcinoma the lung; stage IV; September 30 CT scan chest -subcentimeter right lower lobe peri-bronchial opacities noted-likely inflammatory ; no obvious evidence of recurrence noted.   # Continue Alimta chemotherapy; labs reviewed adequate.  #Right lower lobe subcentimeter nodules/opacities-incidental CT scan.NEW-  Likely inflammatory.  Monitor closely currently asymptomatic hold antibiotics.  Again will review CT scan in 3 months.  # Bilateral lower extremity swelling-intermittent-stable  #Chronic joint pains-NSAIDs as needed.  Stable  # 3 weeks/ labs/alimta/MD.   # I reviewed the blood work- with the patient in detail; also reviewed the imaging independently [as summarized above]; and with the patient in detail.     No orders of the defined types were placed in this encounter.  All questions were answered. The patient knows to call the clinic with any problems, questions or concerns.      [name], MD 09/07/2018 4:51 PM

## 2018-09-20 ENCOUNTER — Other Ambulatory Visit: Payer: Self-pay | Admitting: Internal Medicine

## 2018-09-20 DIAGNOSIS — C33 Malignant neoplasm of trachea: Secondary | ICD-10-CM

## 2018-09-20 DIAGNOSIS — R609 Edema, unspecified: Secondary | ICD-10-CM

## 2018-09-20 DIAGNOSIS — C3480 Malignant neoplasm of overlapping sites of unspecified bronchus and lung: Principal | ICD-10-CM

## 2018-09-26 ENCOUNTER — Encounter: Payer: Self-pay | Admitting: Internal Medicine

## 2018-09-26 ENCOUNTER — Inpatient Hospital Stay: Payer: BLUE CROSS/BLUE SHIELD

## 2018-09-26 ENCOUNTER — Encounter: Payer: Self-pay | Admitting: *Deleted

## 2018-09-26 ENCOUNTER — Inpatient Hospital Stay (HOSPITAL_BASED_OUTPATIENT_CLINIC_OR_DEPARTMENT_OTHER): Payer: BLUE CROSS/BLUE SHIELD | Admitting: Internal Medicine

## 2018-09-26 VITALS — BP 125/85 | HR 70 | Temp 97.2°F | Resp 16 | Wt 215.0 lb

## 2018-09-26 DIAGNOSIS — G8929 Other chronic pain: Secondary | ICD-10-CM

## 2018-09-26 DIAGNOSIS — I712 Thoracic aortic aneurysm, without rupture: Secondary | ICD-10-CM | POA: Diagnosis not present

## 2018-09-26 DIAGNOSIS — R059 Cough, unspecified: Secondary | ICD-10-CM

## 2018-09-26 DIAGNOSIS — Z23 Encounter for immunization: Secondary | ICD-10-CM | POA: Diagnosis not present

## 2018-09-26 DIAGNOSIS — M7989 Other specified soft tissue disorders: Secondary | ICD-10-CM

## 2018-09-26 DIAGNOSIS — Z006 Encounter for examination for normal comparison and control in clinical research program: Secondary | ICD-10-CM

## 2018-09-26 DIAGNOSIS — Z5111 Encounter for antineoplastic chemotherapy: Secondary | ICD-10-CM | POA: Diagnosis not present

## 2018-09-26 DIAGNOSIS — R05 Cough: Secondary | ICD-10-CM

## 2018-09-26 DIAGNOSIS — Z79899 Other long term (current) drug therapy: Secondary | ICD-10-CM | POA: Diagnosis not present

## 2018-09-26 DIAGNOSIS — C3490 Malignant neoplasm of unspecified part of unspecified bronchus or lung: Secondary | ICD-10-CM

## 2018-09-26 DIAGNOSIS — C3402 Malignant neoplasm of left main bronchus: Secondary | ICD-10-CM

## 2018-09-26 LAB — CBC WITH DIFFERENTIAL/PLATELET
Abs Immature Granulocytes: 0.02 10*3/uL (ref 0.00–0.07)
BASOS PCT: 1 %
Basophils Absolute: 0 10*3/uL (ref 0.0–0.1)
EOS ABS: 0.1 10*3/uL (ref 0.0–0.5)
EOS PCT: 3 %
HCT: 39.4 % (ref 39.0–52.0)
Hemoglobin: 13.2 g/dL (ref 13.0–17.0)
Immature Granulocytes: 0 %
Lymphocytes Relative: 33 %
Lymphs Abs: 1.5 10*3/uL (ref 0.7–4.0)
MCH: 31.7 pg (ref 26.0–34.0)
MCHC: 33.5 g/dL (ref 30.0–36.0)
MCV: 94.7 fL (ref 80.0–100.0)
MONO ABS: 0.4 10*3/uL (ref 0.1–1.0)
MONOS PCT: 9 %
Neutro Abs: 2.5 10*3/uL (ref 1.7–7.7)
Neutrophils Relative %: 54 %
Platelets: 146 10*3/uL — ABNORMAL LOW (ref 150–400)
RBC: 4.16 MIL/uL — ABNORMAL LOW (ref 4.22–5.81)
RDW: 12.4 % (ref 11.5–15.5)
WBC: 4.7 10*3/uL (ref 4.0–10.5)
nRBC: 0 % (ref 0.0–0.2)

## 2018-09-26 LAB — COMPREHENSIVE METABOLIC PANEL
ALBUMIN: 4.1 g/dL (ref 3.5–5.0)
ALT: 16 U/L (ref 0–44)
ANION GAP: 7 (ref 5–15)
AST: 22 U/L (ref 15–41)
Alkaline Phosphatase: 38 U/L (ref 38–126)
BILIRUBIN TOTAL: 0.7 mg/dL (ref 0.3–1.2)
BUN: 26 mg/dL — ABNORMAL HIGH (ref 6–20)
CO2: 26 mmol/L (ref 22–32)
Calcium: 9.1 mg/dL (ref 8.9–10.3)
Chloride: 104 mmol/L (ref 98–111)
Creatinine, Ser: 1.11 mg/dL (ref 0.61–1.24)
Glucose, Bld: 116 mg/dL — ABNORMAL HIGH (ref 70–99)
POTASSIUM: 3.9 mmol/L (ref 3.5–5.1)
Sodium: 137 mmol/L (ref 135–145)
Total Protein: 7.3 g/dL (ref 6.5–8.1)

## 2018-09-26 LAB — URINALYSIS, COMPLETE (UACMP) WITH MICROSCOPIC
BILIRUBIN URINE: NEGATIVE
GLUCOSE, UA: NEGATIVE mg/dL
HGB URINE DIPSTICK: NEGATIVE
Ketones, ur: NEGATIVE mg/dL
Leukocytes, UA: NEGATIVE
Nitrite: NEGATIVE
Protein, ur: NEGATIVE mg/dL
Specific Gravity, Urine: 1.015 (ref 1.005–1.030)
pH: 5 (ref 5.0–8.0)

## 2018-09-26 LAB — MAGNESIUM: Magnesium: 1.9 mg/dL (ref 1.7–2.4)

## 2018-09-26 MED ORDER — HEPARIN SOD (PORK) LOCK FLUSH 100 UNIT/ML IV SOLN
500.0000 [IU] | Freq: Once | INTRAVENOUS | Status: AC
  Administered 2018-09-26: 500 [IU] via INTRAVENOUS

## 2018-09-26 NOTE — Progress Notes (Signed)
Mr. Deremer returns to clinic today for consideration of cycle #628 Alimta infusion on the [employer] research study. He is reporting new "cold" symptoms and states he has developed a cough with nasal congestion and occasional runny nose productive of thick yellow mucus. Dr. Rogue Bussing states his lungs sound clear, but given the fact that his CT scan from 3 weeks ago showed some areas of congestion in his lungs, he does not want to give him Alimta today, and wants the patient to go and have a chest x-ray next week. Mr. Markgraf is currently taking an OTC medication for cough and congestion which is Advil Multi-Symptom Cold medicine, and Dr. Rogue Bussing states it is OK to continue this and does not want to prescribe an antibiotic at this time. Patient continues to experience edema in his BLE , mostly  ankles, and reports he is still taking both Lasix and Spironolactone daily for the edema and does not think it has gotten any worse. He also continues to experience the odd itching in his palms and the bottoms of his feet sporadically. Reports experiencing fatigue shortly after infusion, which lasts 3-4 days, then he gradually returns to normal energy level. Patient's VS remain stable today. His weight is 97.5kg today. Mr. Magoon also verifies that he is taking Folic Acid and Mobic daily as prescribed. Patient's glucose level has improved today at 116mg /dL non-fasting. Platelets are a little low at 146,000. Sodium level is 166mmol/L. CBC and chemistry levels are otherwise within acceptable parameters for patient to receive his Alimta infusion; however Dr. Rogue Bussing has decided not to administer the infusion today due to patient development of URI over the past week . He is ordering a chest X-ray for next week and Mr. Soloway was instructed to call if he develops any additional symptoms, especially a fever or chills. He will return to clinic in 3 weeks for continued maintenance Alimta on the S130 protocol provided his URI  has resolved. Patient will return for next infusion on 10/17/2018. Current and ongoing adverse events with grade and attribution are as follows:   Adverse Event Log Study/Protocol: [name] S130 Cycle: 627 Event Grade Onset Date Resolved Date Drug Name Pemetrexed Attribution Treatment Comments  Cough Grade 1 09/19/18   Unlikely OTC meds Alimta held  Nasal congestion Grade 1 09/19/18   Unlikely OTC meds Alimta held  Elevated serum creatinine Grade 1 06/16/18 07/25/18 07/04/18 09/05/18  Possible  Increase fluids   Edema (mild) Grade 1  12/06/2017   Possible  Trace in right and 1+ edema in left ankle  Fatigue Grade1  01/14/16   Possible    Tendonitis Grade 1  09/21/16   Unrelated  Meloxicam  Hypocalcemia Grade 1 05/26/18 06/16/18  Possible  Resolved, Calcium level WNL  Hyperglycemia Grade 1 05/26/18 11/09/15  09/08/16  Unlikely  Non-fasting glucose was 173mg /dL  Thrombocytopenia Grade 1 09/26/18 07/04/18 05/26/18 12/06/17 08/02/17  07/25/18 06/16/18 08/02/17  08/23/17  Possible  Platelet count now 146,000  Nausea Grade 1 11/16/17 11/17/17  Unrelated  R/T antibiotic  Abrasions on Right knuckles Grade 1 02/03/18 02/28/18  Unrelated  r/t Dog walking accident  Pruritis Grade 1  04/19/17   Possible Uses generic lotion with relief deep itching under the skin of palms, feet & right upper arm   Elevated cholesterol Grade 1 03/15/14   Unrelated  No ongoing monitoring available  Valvular Heart Disease Grade 1 10/19/13   Unrelated    Erectile Dysfunction Grade 2 12/11/15   Possible  Takes Cialis   Skin hyperpigmentation Grade 1 07/08/15   Unrelated  Medial aspect of feel bil  Aneurysm of Ascending Aorta Grade 1 10/14/13   Unrelated  mentioned on CT scans  Possible arthritis of Left Knee Grade 1 05/06/15   Unrelated    Elevated BUN Grade 1 07/25/18   Possible Increase fluids BUN 27

## 2018-09-26 NOTE — Assessment & Plan Note (Addendum)
#  Adenocarcinoma the lung; stage IV; September 30 CT scan chest -subcentimeter right lower lobe peri-bronchial opacities noted-likely inflammatory ; no obvious evidence of recurrence noted.  Stable.  #Hold Alimta chemotherapy today; see discussion below.  #Likely URI- coughing/productive/ "sinus pressure" x 1 week; improved with Advil/decongestant.  However given September 30 CT scan right lower lobe subcentimeter nodules/opacities-monitor closely; hold chemotherapy today.  Will antibiotics.  Will repeat chest x-ray in approximately 1 week.  # Bilateral lower extremity swelling-intermittent-stable  #Chronic joint pains-NSAIDs as needed.  Stable  # DISPOSITION:  # treatment- HOLD; cxr in 1week.  # 3 weeks/ labs/alimta/MD- Dr.B

## 2018-09-26 NOTE — Progress Notes (Signed)
[place] OFFICE PROGRESS NOTE  Patient Care Team: [name], MD as PCP - General [facility] Medicine)  Cancer Staging No matching staging information was found for the patient.   Oncology History   # 2012- METASTATIC ADENO CA of LEFT LUNG [acinar pattern] s/p MED LN Bx [NEG- EGFR/K-ras/ALK mutation];[name]; [name]; CT JULY 2016- NED; JAN 2018- NED  # Thoracic Aneurysm [4cm- stable]  ----------------------------------------------------------    DIAGNOSIS: [2012 ] Adenocarcinoma lung  STAGE: 4       ;GOALS: Palliative  CURRENT/MOST RECENT THERAPY '[ ]'  Alimta maintenance      Cancer of hilus of left lung ([place])   07/20/2016 Initial Diagnosis    Cancer of hilus of left lung (HCC)       INTERVAL HISTORY:  Mitchell Mosley 57 y.o.  male pleasant patient above history of metastatic adenocarcinoma the lung currently on maintenance Alimta is here for follow-up.  In the interim patient had a marathon/tennis event where he participated in playing tennis for many hours.  However a week ago patient noted to have " sinus pressure"; greenish phlegm cough.  No fevers or chills.  Continues to have intermittent swelling in the legs especially with exertion.  Otherwise no unusual shortness of breath.  Denies any fevers.  Review of Systems  Constitutional: Negative for chills, diaphoresis, fever, malaise/fatigue and weight loss.  HENT: Negative for nosebleeds and sore throat.   Eyes: Negative for double vision.  Respiratory: Positive for cough and sputum production. Negative for hemoptysis, shortness of breath and wheezing.   Cardiovascular: Positive for leg swelling. Negative for chest pain, palpitations and orthopnea.  Gastrointestinal: Negative for abdominal pain, blood in stool, constipation, diarrhea, heartburn, melena, nausea and vomiting.  Genitourinary:  Negative for dysuria, frequency and urgency.  Musculoskeletal: Negative for back pain and joint pain.  Skin: Negative.  Negative for itching and rash.  Neurological: Negative for dizziness, tingling, focal weakness, weakness and headaches.  Endo/Heme/Allergies: Does not bruise/bleed easily.  Psychiatric/Behavioral: Negative for depression. The patient is not nervous/anxious and does not have insomnia.       PAST MEDICAL HISTORY :  Past Medical History:  Diagnosis Date  . Allergy   . Lung cancer ([place])   . Mild valvular heart disease Nov. 2014   per 2 D Echocardiogram done for persistent leg edema, monitored by cardiology  . Peripheral edema     PAST SURGICAL HISTORY :   Past Surgical History:  Procedure Laterality Date  . ELBOW SURGERY    . Left anterior thoracotomy with biopsy  March 2012  . PORTACATH PLACEMENT  March 2012  . TONSILLECTOMY      FAMILY HISTORY :   Family History  Problem Relation Age of Onset  . Leukemia Father   . Diabetes Mother     SOCIAL HISTORY:   Social History   Tobacco Use  . Smoking status: Never Smoker  . Smokeless tobacco: Never Used  Substance Use Topics  . Alcohol use: Yes    Alcohol/week: 0.0 standard drinks    Comment: "social drinker"   . Drug use: No    ALLERGIES:  is allergic to penicillins.  MEDICATIONS:  Current Outpatient Medications  Medication Sig Dispense Refill  . folic acid (FOLVITE) 1 MG tablet TAKE 1 TABLET (1 MG TOTAL) BY MOUTH DAILY. 30 tablet 11  . furosemide (LASIX) 20 MG tablet TAKE 1 TABLET (20 MG TOTAL) BY  MOUTH DAILY. ONE TABLET DAILY AS NEEDED FOR SWELLING 90 tablet 1  . loratadine (CLARITIN) 10 MG tablet Take 1 tablet (10 mg total) by mouth daily.    . meloxicam (MOBIC) 7.5 MG tablet Take 7.5 mg by mouth daily.     . Multiple Vitamin (MULTIVITAMIN) capsule Take 1 capsule by mouth daily. Reported on 02/24/2016    . Omega-3 Fatty Acids (FISH OIL) 1000 MG CAPS Take 1 capsule by mouth daily. Reported on  02/24/2016    . senna (SENOKOT) 8.6 MG tablet Take 1 tablet by mouth daily. Take as needed around time of chemotherapy    . spironolactone (ALDACTONE) 25 MG tablet TAKE 1 TABLET BY MOUTH EVERY DAY 90 tablet 3  . tadalafil (ADCIRCA/CIALIS) 20 MG tablet TAKE 1 TABLET BY MOUTH EVERY 2 DAYS AS NEEDED. 6 tablet 11  . vitamin B-12 (CYANOCOBALAMIN) 1000 MCG tablet Take 1,000 mcg by mouth daily.     No current facility-administered medications for this visit.    Facility-Administered Medications Ordered in Other Visits  Medication Dose Route Frequency Provider Last Rate Last Dose  . sodium chloride 0.9 % injection 10 mL  10 mL Intracatheter PRN [name], MD   10 mL at 05/27/15 1543  . sodium chloride 0.9 % injection 10 mL  10 mL Intracatheter PRN [name], MD   10 mL at 06/17/15 1510  . sodium chloride 0.9 % injection 10 mL  10 mL Intravenous PRN [name], MD   10 mL at 07/08/15 1340    PHYSICAL EXAMINATION: ECOG PERFORMANCE STATUS: 0 - Asymptomatic  BP 125/85 (BP Location: Left Arm, Patient Position: Sitting)   Pulse 70   Temp (!) 97.2 F (36.2 C) (Tympanic)   Resp 16   Wt 215 lb (97.5 kg)   BMI 30.85 kg/m   Filed Weights   09/26/18 1319  Weight: 215 lb (97.5 kg)    Physical Exam  Constitutional: He is oriented to person, place, and time and well-developed, well-nourished, and in no distress.  HENT:  Head: Normocephalic and atraumatic.  Mouth/Throat: Oropharynx is clear and moist. No oropharyngeal exudate.  Eyes: Pupils are equal, round, and reactive to light.  Neck: Normal range of motion. Neck supple.  Cardiovascular: Normal rate and regular rhythm.  Pulmonary/Chest: No respiratory distress. He has no wheezes.  Abdominal: Soft. Bowel sounds are normal. He exhibits no distension and no mass. There is no tenderness. There is no rebound and no guarding.  Musculoskeletal: Normal range of motion. He exhibits edema. He exhibits no tenderness.  Grade 1 swelling in the  legs bilaterally.  Neurological: He is alert and oriented to person, place, and time.  Skin: Skin is warm.  Psychiatric: Affect normal.       LABORATORY DATA:  I have reviewed the data as listed    Component Value Date/Time   NA 137 09/26/2018 1305   NA 138 12/10/2014 1341   K 3.9 09/26/2018 1305   K 3.6 12/10/2014 1341   CL 104 09/26/2018 1305   CL 102 12/10/2014 1341   CO2 26 09/26/2018 1305   CO2 30 12/10/2014 1341   GLUCOSE 116 (H) 09/26/2018 1305   GLUCOSE 160 (H) 12/10/2014 1341   BUN 26 (H) 09/26/2018 1305   BUN 21 (H) 12/10/2014 1341   CREATININE 1.11 09/26/2018 1305   CREATININE 1.10 03/25/2015 1453   CALCIUM 9.1 09/26/2018 1305   CALCIUM 8.4 (L) 12/10/2014 1341   PROT 7.3 09/26/2018 1305   PROT 6.6 12/10/2014 1341  ALBUMIN 4.1 09/26/2018 1305   ALBUMIN 3.4 12/10/2014 1341   AST 22 09/26/2018 1305   AST 17 12/10/2014 1341   ALT 16 09/26/2018 1305   ALT 23 12/10/2014 1341   ALKPHOS 38 09/26/2018 1305   ALKPHOS 50 12/10/2014 1341   BILITOT 0.7 09/26/2018 1305   BILITOT 0.5 12/10/2014 1341   GFRNONAA >60 09/26/2018 1305   GFRNONAA >60 03/25/2015 1453   GFRAA >60 09/26/2018 1305   GFRAA >60 03/25/2015 1453    No results found for: SPEP, UPEP  Lab Results  Component Value Date   WBC 4.7 09/26/2018   NEUTROABS 2.5 09/26/2018   HGB 13.2 09/26/2018   HCT 39.4 09/26/2018   MCV 94.7 09/26/2018   PLT 146 (L) 09/26/2018      Chemistry      Component Value Date/Time   NA 137 09/26/2018 1305   NA 138 12/10/2014 1341   K 3.9 09/26/2018 1305   K 3.6 12/10/2014 1341   CL 104 09/26/2018 1305   CL 102 12/10/2014 1341   CO2 26 09/26/2018 1305   CO2 30 12/10/2014 1341   BUN 26 (H) 09/26/2018 1305   BUN 21 (H) 12/10/2014 1341   CREATININE 1.11 09/26/2018 1305   CREATININE 1.10 03/25/2015 1453      Component Value Date/Time   CALCIUM 9.1 09/26/2018 1305   CALCIUM 8.4 (L) 12/10/2014 1341   ALKPHOS 38 09/26/2018 1305   ALKPHOS 50 12/10/2014 1341   AST  22 09/26/2018 1305   AST 17 12/10/2014 1341   ALT 16 09/26/2018 1305   ALT 23 12/10/2014 1341   BILITOT 0.7 09/26/2018 1305   BILITOT 0.5 12/10/2014 1341       RADIOGRAPHIC STUDIES: I have personally reviewed the radiological images as listed and agreed with the findings in the report. No results found.   ASSESSMENT & PLAN:  Cancer of hilus of left lung (HCC) #Adenocarcinoma the lung; stage IV; September 30 CT scan chest -subcentimeter right lower lobe peri-bronchial opacities noted-likely inflammatory ; no obvious evidence of recurrence noted.  Stable.  #Hold Alimta chemotherapy today; see discussion below.  #Likely URI- coughing/productive/ "sinus pressure" x 1 week; improved with Advil/decongestant.  However given September 30 CT scan right lower lobe subcentimeter nodules/opacities-monitor closely; hold chemotherapy today.  Will antibiotics.  Will repeat chest x-ray in approximately 1 week.  # Bilateral lower extremity swelling-intermittent-stable  #Chronic joint pains-NSAIDs as needed.  Stable  # DISPOSITION:  # treatment- HOLD; cxr in 1week.  # 3 weeks/ labs/alimta/MD- Dr.B   Orders Placed This Encounter  Procedures  . DG Chest 2 View    Standing Status:   Future    Standing Expiration Date:   11/26/2019    Order Specific Question:   Reason for Exam (SYMPTOM  OR DIAGNOSIS REQUIRED)    Answer:   cough/ and hx of lung cancer    Order Specific Question:   Preferred imaging location?    Answer:   [facility]   All questions were answered. The patient knows to call the clinic with any problems, questions or concerns.      [name], MD 09/28/2018 4:00 PM

## 2018-10-03 ENCOUNTER — Ambulatory Visit: Payer: BLUE CROSS/BLUE SHIELD

## 2018-10-03 ENCOUNTER — Ambulatory Visit: Payer: BLUE CROSS/BLUE SHIELD | Admitting: Internal Medicine

## 2018-10-03 ENCOUNTER — Telehealth: Payer: Self-pay | Admitting: Internal Medicine

## 2018-10-03 ENCOUNTER — Other Ambulatory Visit: Payer: BLUE CROSS/BLUE SHIELD

## 2018-10-03 ENCOUNTER — Ambulatory Visit
Admission: RE | Admit: 2018-10-03 | Discharge: 2018-10-03 | Disposition: A | Discharge status: Home / Self Care | Payer: BLUE CROSS/BLUE SHIELD | Source: Ambulatory Visit | Attending: Internal Medicine | Admitting: Internal Medicine

## 2018-10-03 DIAGNOSIS — R05 Cough: Secondary | ICD-10-CM | POA: Insufficient documentation

## 2018-10-03 DIAGNOSIS — R059 Cough, unspecified: Secondary | ICD-10-CM

## 2018-10-03 DIAGNOSIS — C3402 Malignant neoplasm of left main bronchus: Secondary | ICD-10-CM | POA: Insufficient documentation

## 2018-10-03 NOTE — Telephone Encounter (Signed)
Mychart message sent.

## 2018-10-17 ENCOUNTER — Encounter: Payer: Self-pay | Admitting: Internal Medicine

## 2018-10-17 ENCOUNTER — Encounter: Payer: Self-pay | Admitting: *Deleted

## 2018-10-17 ENCOUNTER — Inpatient Hospital Stay (HOSPITAL_BASED_OUTPATIENT_CLINIC_OR_DEPARTMENT_OTHER): Payer: BLUE CROSS/BLUE SHIELD | Admitting: Internal Medicine

## 2018-10-17 ENCOUNTER — Inpatient Hospital Stay: Payer: BLUE CROSS/BLUE SHIELD

## 2018-10-17 ENCOUNTER — Inpatient Hospital Stay: Payer: BLUE CROSS/BLUE SHIELD | Attending: Internal Medicine

## 2018-10-17 ENCOUNTER — Other Ambulatory Visit: Payer: Self-pay

## 2018-10-17 VITALS — BP 117/77 | HR 79 | Temp 98.3°F | Resp 20 | Ht 70.0 in | Wt 215.0 lb

## 2018-10-17 DIAGNOSIS — G8929 Other chronic pain: Secondary | ICD-10-CM

## 2018-10-17 DIAGNOSIS — Z006 Encounter for examination for normal comparison and control in clinical research program: Secondary | ICD-10-CM | POA: Diagnosis not present

## 2018-10-17 DIAGNOSIS — M7989 Other specified soft tissue disorders: Secondary | ICD-10-CM | POA: Insufficient documentation

## 2018-10-17 DIAGNOSIS — M2550 Pain in unspecified joint: Secondary | ICD-10-CM | POA: Diagnosis not present

## 2018-10-17 DIAGNOSIS — R05 Cough: Secondary | ICD-10-CM | POA: Diagnosis not present

## 2018-10-17 DIAGNOSIS — C3402 Malignant neoplasm of left main bronchus: Secondary | ICD-10-CM

## 2018-10-17 LAB — CBC WITH DIFFERENTIAL/PLATELET
Abs Immature Granulocytes: 0.01 10*3/uL (ref 0.00–0.07)
Basophils Absolute: 0.1 10*3/uL (ref 0.0–0.1)
Basophils Relative: 1 %
Eosinophils Absolute: 0.2 10*3/uL (ref 0.0–0.5)
Eosinophils Relative: 3 %
HCT: 40.4 % (ref 39.0–52.0)
HEMOGLOBIN: 13.7 g/dL (ref 13.0–17.0)
IMMATURE GRANULOCYTES: 0 %
LYMPHS PCT: 25 %
Lymphs Abs: 1.4 10*3/uL (ref 0.7–4.0)
MCH: 31.7 pg (ref 26.0–34.0)
MCHC: 33.9 g/dL (ref 30.0–36.0)
MCV: 93.5 fL (ref 80.0–100.0)
Monocytes Absolute: 0.5 10*3/uL (ref 0.1–1.0)
Monocytes Relative: 8 %
NEUTROS ABS: 3.3 10*3/uL (ref 1.7–7.7)
Neutrophils Relative %: 63 %
Platelets: 137 10*3/uL — ABNORMAL LOW (ref 150–400)
RBC: 4.32 MIL/uL (ref 4.22–5.81)
RDW: 11.9 % (ref 11.5–15.5)
WBC: 5.4 10*3/uL (ref 4.0–10.5)
nRBC: 0 % (ref 0.0–0.2)

## 2018-10-17 LAB — COMPREHENSIVE METABOLIC PANEL
ALT: 15 U/L (ref 0–44)
AST: 23 U/L (ref 15–41)
Albumin: 4.1 g/dL (ref 3.5–5.0)
Alkaline Phosphatase: 36 U/L — ABNORMAL LOW (ref 38–126)
Anion gap: 7 (ref 5–15)
BUN: 16 mg/dL (ref 6–20)
CO2: 28 mmol/L (ref 22–32)
Calcium: 9.1 mg/dL (ref 8.9–10.3)
Chloride: 99 mmol/L (ref 98–111)
Creatinine, Ser: 1.25 mg/dL — ABNORMAL HIGH (ref 0.61–1.24)
GFR calc Af Amer: >60 mL/min (ref >60)
Glucose, Bld: 186 mg/dL — ABNORMAL HIGH (ref 70–99)
POTASSIUM: 4.1 mmol/L (ref 3.5–5.1)
Sodium: 134 mmol/L — ABNORMAL LOW (ref 135–145)
Total Bilirubin: 0.8 mg/dL (ref 0.3–1.2)
Total Protein: 7.1 g/dL (ref 6.5–8.1)

## 2018-10-17 MED ORDER — HEPARIN SOD (PORK) LOCK FLUSH 100 UNIT/ML IV SOLN
500.0000 [IU] | Freq: Once | INTRAVENOUS | Status: AC | PRN
  Administered 2018-10-17: 500 [IU]

## 2018-10-17 MED ORDER — SODIUM CHLORIDE 0.9 % IV SOLN
500.0000 mg/m2 | Freq: Once | INTRAVENOUS | Status: AC
  Administered 2018-10-17: 1050 mg via INTRAVENOUS
  Filled 2018-10-17: qty 42

## 2018-10-17 MED ORDER — SODIUM CHLORIDE 0.9 % IV SOLN
Freq: Once | INTRAVENOUS | Status: AC
  Administered 2018-10-17: 14:00:00 via INTRAVENOUS
  Filled 2018-10-17: qty 250

## 2018-10-17 MED ORDER — SODIUM CHLORIDE 0.9 % IV SOLN
Freq: Once | INTRAVENOUS | Status: AC
  Administered 2018-10-17: 15:00:00 via INTRAVENOUS
  Filled 2018-10-17: qty 4

## 2018-10-17 NOTE — Assessment & Plan Note (Addendum)
#  Adenocarcinoma the lung; stage IV; September 30 CT scan chest -subcentimeter right lower lobe peri-bronchial opacities noted-likely inflammatory ; no obvious evidence of recurrence noted.  Stable.  # Proceed with Alimta; Labs today reviewed;  acceptable for treatment today.   #Likely URI- coughing/productive/ "sinus pressure"- CXR- recent WNL.  Improved.  # Bilateral lower extremity swelling-intermittent-stable e.   #Chronic joint pains-NSAIDs as needed.  Stable  # DISPOSITION:  # treatment-today # 3 weeks/ labs/alimta/MD- Dr.B

## 2018-10-17 NOTE — Progress Notes (Signed)
Mitchell Mosley returns to clinic today for consideration of cycle #629 Alimta infusion on the [employer] research study. He has recovered from the cold symptoms and had a chest xray on 10/03/18, which was negative. Patient states he feels much better today and denies any further cough. Patient continues to experience edema in his BLE , mostly  ankles, and reports he is still taking both Lasix and Spironolactone daily for the edema and does not think it has gotten any worse. He also continues to experience the odd itching in his palms and the bottoms of his feet sporadically, which is relieved with use of generic skin lotion. Reports experiencing fatigue shortly after infusion, which lasts 3-4 days, then he gradually returns to normal energy level. Patient's VS remain stable today. His weight is 97.5kg today. Mitchell Mosley also verifies that he is taking Folic Acid and Mobic daily as prescribed. Patient's glucose level is elevated again at 186mg /dL non-fasting. Platelets remain low at 137,000. Sodium level is down to 136mmol/L, and serum creatinine is slightly elevated at 1.25mg /dL. CBC and chemistry levels are otherwise within acceptable parameters for patient to receive his Alimta infusion and Dr. Rogue Bussing has examined patient and agrees that we can proceed with Alimta today. Patient scheduled to return to clinic in 3 weeks for continued maintenance Alimta on the S130 protocol and will receive a vitamin B12 injection at that time - on 11/07/2018. Current and ongoing adverse events with grade and attribution are as follows:   Adverse Event Log Study/Protocol: Mitchell Mosley S130 Cycle: 628 Event Grade Onset Date Resolved Date Drug Name Pemetrexed Attribution Treatment Comments  Hyponatremia Grade 1 10/17/18   Unlikely  Sodium 134  Cough Grade 1 09/19/18 10/17/18  Unlikely    Nasal congestion Grade 1 09/19/18 10/17/18  Unlikely    Elevated serum creatinine Grade 1 10/17/18 06/16/18 07/25/18  07/04/18 09/05/18   Possible  Increase fluids Serum creatinine is 1.25mg /dL  Edema (mild) Grade 1  12/06/2017   Possible  Trace in right and 1+ edema in left ankle  Fatigue Grade1  01/14/16   Possible    Tendonitis Grade 1  09/21/16   Unrelated  Meloxicam  Hypocalcemia Grade 1 05/26/18 06/16/18  Possible  Resolved, Calcium level WNL  Hyperglycemia Grade 1 05/26/18 11/09/15  09/08/16  Unlikely  Non-fasting glucose was 173mg /dL  Thrombocytopenia Grade 1 09/26/18 07/04/18 05/26/18 12/06/17 08/02/17  07/25/18 06/16/18 08/02/17  08/23/17  Possible  Platelet count now 137,000  Nausea Grade 1 11/16/17 11/17/17  Unrelated  R/T antibiotic  Abrasions on Right knuckles Grade 1 02/03/18 02/28/18  Unrelated  r/t Dog walking accident  Pruritis Grade 1  04/19/17   Possible Uses generic lotion with relief deep itching under the skin of palms, feet & right upper arm   Elevated cholesterol Grade 1 03/15/14   Unrelated  No ongoing monitoring available  Valvular Heart Disease Grade 1 10/19/13   Unrelated    Erectile Dysfunction Grade 2 12/11/15   Possible Takes Cialis   Skin hyperpigmentation Grade 1 07/08/15   Unrelated  Medial aspect of feel bil  Aneurysm of Ascending Aorta Grade 1 10/14/13   Unrelated  mentioned on CT scans  Possible arthritis of Left Knee Grade 1 05/06/15   Unrelated    Elevated BUN Grade 1 07/25/18 08/18/18  Possible    Yolande Jolly, BSN, MHA, OCN 10/17/2018 2:07 PM

## 2018-10-17 NOTE — Progress Notes (Signed)
[facility] OFFICE PROGRESS NOTE  Patient Care Team: [name], MD as PCP - [facility])  Cancer Staging No matching staging information was found for the patient.   Oncology History   # 2012- METASTATIC ADENO CA of LEFT LUNG [acinar pattern] s/p MED LN [facility]x [NEG- EGFR/K-ras/ALK mutation];[name]; [name]; CT JULY 2016- NED; JAN 2018- NED  # Thoracic Aneurysm [4cm- stable]  ----------------------------------------------------------    DIAGNOSIS: [2012 ] Adenocarcinoma lung  STAGE: 4       ;GOALS: Palliative  CURRENT/MOST RECENT THERAPY '[ ]'$  Alimta maintenance      Cancer of hilus of left lung ([place])   07/20/2016 Initial Diagnosis    Cancer of hilus of left lung (HCC)       INTERVAL HISTORY:  Mitchell Mosley 57 y.o.  male pleasant patient above history of metastatic adenocarcinoma the lung currently on maintenance Alimta is here for follow-up.  Chemo was held in 3 weeks ago- secondary to URI.   He currently denies any worsening shortness of breath or cough.  Sinus pressure improved.  Continues to have intermittent swelling in the legs especially with heavy exertion.  No fevers or chills.   Review of Systems  Constitutional: Negative for chills, diaphoresis, fever, malaise/fatigue and weight loss.  HENT: Negative for nosebleeds and sore throat.   Eyes: Negative for double vision.  Respiratory: Negative for hemoptysis, shortness of breath and wheezing.   Cardiovascular: Positive for leg swelling. Negative for chest pain, palpitations and orthopnea.  Gastrointestinal: Negative for abdominal pain, blood in stool, constipation, diarrhea, heartburn, melena, nausea and vomiting.  Genitourinary: Negative for dysuria, frequency and urgency.  Musculoskeletal: Negative for back pain and joint pain.  Skin: Negative.  Negative for itching and  rash.  Neurological: Negative for dizziness, tingling, focal weakness, weakness and headaches.  Endo/Heme/Allergies: Does not bruise/bleed easily.  Psychiatric/Behavioral: Negative for depression. The patient is not nervous/anxious and does not have insomnia.       PAST MEDICAL HISTORY :  Past Medical History:  Diagnosis Date  . Allergy   . Lung cancer ([place])   . Mild valvular heart disease Nov. 2014   per 2 D Echocardiogram done for persistent leg edema, monitored by cardiology  . Peripheral edema     PAST SURGICAL HISTORY :   Past Surgical History:  Procedure Laterality Date  . ELBOW SURGERY    . Left anterior thoracotomy with biopsy  March 2012  . PORTACATH PLACEMENT  March 2012  . TONSILLECTOMY      FAMILY HISTORY :   Family History  Problem Relation Age of Onset  . Leukemia Father   . Diabetes Mother     SOCIAL HISTORY:   Social History   Tobacco Use  . Smoking status: Never Smoker  . Smokeless tobacco: Never Used  Substance Use Topics  . Alcohol use: Yes    Alcohol/week: 0.0 standard drinks    Comment: "social drinker"   . Drug use: No    ALLERGIES:  is allergic to penicillins.  MEDICATIONS:  Current Outpatient Medications  Medication Sig Dispense Refill  . folic acid (FOLVITE) 1 MG tablet TAKE 1 TABLET (1 MG TOTAL) BY MOUTH DAILY. 30 tablet 11  . furosemide (LASIX) 20 MG tablet TAKE 1 TABLET (20 MG TOTAL) BY MOUTH DAILY. ONE TABLET DAILY AS NEEDED FOR SWELLING 90 tablet 1  . loratadine (CLARITIN) 10 MG tablet Take 1  tablet (10 mg total) by mouth daily.    . meloxicam (MOBIC) 7.5 MG tablet Take 7.5 mg by mouth daily.     . Multiple Vitamin (MULTIVITAMIN) capsule Take 1 capsule by mouth daily. Reported on 02/24/2016    . Omega-3 Fatty Acids (FISH OIL) 1000 MG CAPS Take 1 capsule by mouth daily. Reported on 02/24/2016    . senna (SENOKOT) 8.6 MG tablet Take 1 tablet by mouth daily. Take as needed around time of chemotherapy    . spironolactone (ALDACTONE) 25  MG tablet TAKE 1 TABLET BY MOUTH EVERY DAY 90 tablet 3  . tadalafil (ADCIRCA/CIALIS) 20 MG tablet TAKE 1 TABLET BY MOUTH EVERY 2 DAYS AS NEEDED. 6 tablet 11  . vitamin B-12 (CYANOCOBALAMIN) 1000 MCG tablet Take 1,000 mcg by mouth daily.     No current facility-administered medications for this visit.    Facility-Administered Medications Ordered in Other Visits  Medication Dose Route Frequency Provider Last Rate Last Dose  . sodium chloride 0.9 % injection 10 mL  10 mL Intracatheter PRN [name], MD   10 mL at 05/27/15 1543  . sodium chloride 0.9 % injection 10 mL  10 mL Intracatheter PRN [name], MD   10 mL at 06/17/15 1510  . sodium chloride 0.9 % injection 10 mL  10 mL Intravenous PRN [name], MD   10 mL at 07/08/15 1340    PHYSICAL EXAMINATION: ECOG PERFORMANCE STATUS: 0 - Asymptomatic  BP 117/77 (Patient Position: Sitting)   Pulse 79   Temp 98.3 F (36.8 C) (Oral)   Resp 20   Ht '5\' 10"'$  (1.778 m)   Wt 215 lb (97.5 kg)   BMI 30.85 kg/m   Filed Weights   10/17/18 1325  Weight: 215 lb (97.5 kg)    Physical Exam  Constitutional: He is oriented to person, place, and time and well-developed, well-nourished, and in no distress.  HENT:  Head: Normocephalic and atraumatic.  Mouth/Throat: Oropharynx is clear and moist. No oropharyngeal exudate.  Eyes: Pupils are equal, round, and reactive to light.  Neck: Normal range of motion. Neck supple.  Cardiovascular: Normal rate and regular rhythm.  Pulmonary/Chest: No respiratory distress. He has no wheezes.  Abdominal: Soft. Bowel sounds are normal. He exhibits no distension and no mass. There is no tenderness. There is no rebound and no guarding.  Musculoskeletal: Normal range of motion. He exhibits edema. He exhibits no tenderness.  Grade 1 swelling in the legs bilaterally.  Neurological: He is alert and oriented to person, place, and time.  Skin: Skin is warm.  Psychiatric: Affect normal.       LABORATORY  DATA:  I have reviewed the data as listed    Component Value Date/Time   NA 134 (L) 10/17/2018 1307   NA 138 12/10/2014 1341   K 4.1 10/17/2018 1307   K 3.6 12/10/2014 1341   CL 99 10/17/2018 1307   CL 102 12/10/2014 1341   CO2 28 10/17/2018 1307   CO2 30 12/10/2014 1341   GLUCOSE 186 (H) 10/17/2018 1307   GLUCOSE 160 (H) 12/10/2014 1341   BUN 16 10/17/2018 1307   BUN 21 (H) 12/10/2014 1341   CREATININE 1.25 (H) 10/17/2018 1307   CREATININE 1.10 03/25/2015 1453   CALCIUM 9.1 10/17/2018 1307   CALCIUM 8.4 (L) 12/10/2014 1341   PROT 7.1 10/17/2018 1307   PROT 6.6 12/10/2014 1341   ALBUMIN 4.1 10/17/2018 1307   ALBUMIN 3.4 12/10/2014 1341   AST 23 10/17/2018 1307  AST 17 12/10/2014 1341   ALT 15 10/17/2018 1307   ALT 23 12/10/2014 1341   ALKPHOS 36 (L) 10/17/2018 1307   ALKPHOS 50 12/10/2014 1341   BILITOT 0.8 10/17/2018 1307   BILITOT 0.5 12/10/2014 1341   GFRNONAA >60 10/17/2018 1307   GFRNONAA >60 03/25/2015 1453   GFRAA >60 10/17/2018 1307   GFRAA >60 03/25/2015 1453    No results found for: SPEP, UPEP  Lab Results  Component Value Date   WBC 5.4 10/17/2018   NEUTROABS 3.3 10/17/2018   HGB 13.7 10/17/2018   HCT 40.4 10/17/2018   MCV 93.5 10/17/2018   PLT 137 (L) 10/17/2018      Chemistry      Component Value Date/Time   NA 134 (L) 10/17/2018 1307   NA 138 12/10/2014 1341   K 4.1 10/17/2018 1307   K 3.6 12/10/2014 1341   CL 99 10/17/2018 1307   CL 102 12/10/2014 1341   CO2 28 10/17/2018 1307   CO2 30 12/10/2014 1341   BUN 16 10/17/2018 1307   BUN 21 (H) 12/10/2014 1341   CREATININE 1.25 (H) 10/17/2018 1307   CREATININE 1.10 03/25/2015 1453      Component Value Date/Time   CALCIUM 9.1 10/17/2018 1307   CALCIUM 8.4 (L) 12/10/2014 1341   ALKPHOS 36 (L) 10/17/2018 1307   ALKPHOS 50 12/10/2014 1341   AST 23 10/17/2018 1307   AST 17 12/10/2014 1341   ALT 15 10/17/2018 1307   ALT 23 12/10/2014 1341   BILITOT 0.8 10/17/2018 1307   BILITOT 0.5  12/10/2014 1341       RADIOGRAPHIC STUDIES: I have personally reviewed the radiological images as listed and agreed with the findings in the report. No results found.   ASSESSMENT & PLAN:  Cancer of hilus of left lung (HCC) #Adenocarcinoma the lung; stage IV; September 30 CT scan chest -subcentimeter right lower lobe peri-bronchial opacities noted-likely inflammatory ; no obvious evidence of recurrence noted. Stable.   # Proceed with Alimta; Labs today reviewed;  acceptable for treatment today.   #Likely URI- coughing/productive/ "sinus pressure"- CXR- recent WNL.   # Bilateral lower extremity swelling-intermittent- stable.   #Chronic joint pains-NSAIDs as needed.  Stable  # DISPOSITION:  # treatment-today # 3 weeks/ labs/alimta/MD- Dr.B   No orders of the defined types were placed in this encounter.  All questions were answered. The patient knows to call the clinic with any problems, questions or concerns.      [name], MD 10/20/2018 7:33 AM

## 2018-11-07 ENCOUNTER — Inpatient Hospital Stay: Payer: BLUE CROSS/BLUE SHIELD

## 2018-11-07 ENCOUNTER — Encounter: Payer: Self-pay | Admitting: *Deleted

## 2018-11-07 ENCOUNTER — Inpatient Hospital Stay: Payer: BLUE CROSS/BLUE SHIELD | Attending: Internal Medicine | Admitting: Internal Medicine

## 2018-11-07 ENCOUNTER — Encounter: Payer: Self-pay | Admitting: Internal Medicine

## 2018-11-07 VITALS — BP 133/78 | HR 85 | Temp 99.1°F | Resp 18 | Ht 70.0 in | Wt 214.0 lb

## 2018-11-07 DIAGNOSIS — C3402 Malignant neoplasm of left main bronchus: Secondary | ICD-10-CM

## 2018-11-07 DIAGNOSIS — G8929 Other chronic pain: Secondary | ICD-10-CM | POA: Diagnosis not present

## 2018-11-07 DIAGNOSIS — Z5112 Encounter for antineoplastic immunotherapy: Secondary | ICD-10-CM | POA: Insufficient documentation

## 2018-11-07 DIAGNOSIS — Z5111 Encounter for antineoplastic chemotherapy: Secondary | ICD-10-CM

## 2018-11-07 DIAGNOSIS — M2550 Pain in unspecified joint: Secondary | ICD-10-CM | POA: Insufficient documentation

## 2018-11-07 DIAGNOSIS — D696 Thrombocytopenia, unspecified: Secondary | ICD-10-CM

## 2018-11-07 DIAGNOSIS — Z006 Encounter for examination for normal comparison and control in clinical research program: Secondary | ICD-10-CM | POA: Insufficient documentation

## 2018-11-07 DIAGNOSIS — M7989 Other specified soft tissue disorders: Secondary | ICD-10-CM | POA: Diagnosis not present

## 2018-11-07 LAB — CBC WITH DIFFERENTIAL/PLATELET
Abs Immature Granulocytes: 0.01 10*3/uL (ref 0.00–0.07)
BASOS PCT: 1 %
Basophils Absolute: 0 10*3/uL (ref 0.0–0.1)
Eosinophils Absolute: 0.2 10*3/uL (ref 0.0–0.5)
Eosinophils Relative: 3 %
HCT: 40.1 % (ref 39.0–52.0)
Hemoglobin: 13.6 g/dL (ref 13.0–17.0)
Immature Granulocytes: 0 %
Lymphocytes Relative: 27 %
Lymphs Abs: 1.4 10*3/uL (ref 0.7–4.0)
MCH: 31.7 pg (ref 26.0–34.0)
MCHC: 33.9 g/dL (ref 30.0–36.0)
MCV: 93.5 fL (ref 80.0–100.0)
Monocytes Absolute: 0.5 10*3/uL (ref 0.1–1.0)
Monocytes Relative: 9 %
NEUTROS ABS: 3.2 10*3/uL (ref 1.7–7.7)
Neutrophils Relative %: 60 %
Platelets: 141 10*3/uL — ABNORMAL LOW (ref 150–400)
RBC: 4.29 MIL/uL (ref 4.22–5.81)
RDW: 12.4 % (ref 11.5–15.5)
WBC: 5.2 10*3/uL (ref 4.0–10.5)
nRBC: 0 % (ref 0.0–0.2)

## 2018-11-07 LAB — COMPREHENSIVE METABOLIC PANEL
ALK PHOS: 34 U/L — AB (ref 38–126)
ALT: 18 U/L (ref 0–44)
ANION GAP: 9 (ref 5–15)
AST: 26 U/L (ref 15–41)
Albumin: 3.9 g/dL (ref 3.5–5.0)
BUN: 19 mg/dL (ref 6–20)
CALCIUM: 8.9 mg/dL (ref 8.9–10.3)
CO2: 25 mmol/L (ref 22–32)
Chloride: 104 mmol/L (ref 98–111)
Creatinine, Ser: 1.1 mg/dL (ref 0.61–1.24)
GFR calc non Af Amer: >60 mL/min (ref >60)
GLUCOSE: 194 mg/dL — AB (ref 70–99)
Potassium: 4 mmol/L (ref 3.5–5.1)
SODIUM: 138 mmol/L (ref 135–145)
TOTAL PROTEIN: 6.7 g/dL (ref 6.5–8.1)
Total Bilirubin: 0.9 mg/dL (ref 0.3–1.2)

## 2018-11-07 MED ORDER — CYANOCOBALAMIN 1000 MCG/ML IJ SOLN
1000.0000 ug | Freq: Once | INTRAMUSCULAR | Status: AC
  Administered 2018-11-07: 1000 ug via INTRAMUSCULAR
  Filled 2018-11-07: qty 1

## 2018-11-07 MED ORDER — HEPARIN SOD (PORK) LOCK FLUSH 100 UNIT/ML IV SOLN
500.0000 [IU] | Freq: Once | INTRAVENOUS | Status: AC | PRN
  Administered 2018-11-07: 500 [IU]

## 2018-11-07 MED ORDER — SODIUM CHLORIDE 0.9 % IJ SOLN
10.0000 mL | INTRAMUSCULAR | Status: DC | PRN
  Administered 2018-11-07: 10 mL
  Filled 2018-11-07: qty 10

## 2018-11-07 MED ORDER — SODIUM CHLORIDE 0.9 % IV SOLN
500.0000 mg/m2 | Freq: Once | INTRAVENOUS | Status: AC
  Administered 2018-11-07: 1050 mg via INTRAVENOUS
  Filled 2018-11-07: qty 42

## 2018-11-07 MED ORDER — SODIUM CHLORIDE 0.9 % IV SOLN
Freq: Once | INTRAVENOUS | Status: AC
  Administered 2018-11-07: 15:00:00 via INTRAVENOUS
  Filled 2018-11-07: qty 4

## 2018-11-07 MED ORDER — HEPARIN SOD (PORK) LOCK FLUSH 100 UNIT/ML IV SOLN
INTRAVENOUS | Status: AC
  Filled 2018-11-07: qty 5

## 2018-11-07 MED ORDER — SODIUM CHLORIDE 0.9 % IV SOLN
Freq: Once | INTRAVENOUS | Status: AC
  Administered 2018-11-07: 15:00:00 via INTRAVENOUS
  Filled 2018-11-07: qty 250

## 2018-11-07 NOTE — Progress Notes (Signed)
Mitchell Mosley returns to clinic today for consideration of cycle #630 Alimta infusion on the [employer] research study. States he is doing about the same and denies any new adverse events since last in the clinic. Patient continues to experience mild (1+) edema in his BLE, mostly in his ankles, and reports he is still taking both Lasix and Spironolactone daily for the edema. He also continues to experience the odd itching in his palms, the bottoms of his feet sporadically and in his right upper inner arm, which is relieved with use of generic skin lotion. Reports mild fatigue that begins shortly after infusion, which lasts 3-4 days, then he gradually returns to normal energy level. Patient's VS remain stable today. His weight is 97.1kg today. Mitchell Mosley also verifies that he is taking Folic Acid and Mobic daily as prescribed. Patient's glucose level is elevated again at 194mg /dL non-fasting. Platelets remain low at 141,000 - which is normal for patient. Sodium level is normal today at 181mmol/L, and serum creatinine is now wnl at 1.10mg /dL. CBC and chemistry levels are otherwise within acceptable parameters for patient to receive his Alimta infusion and Dr. Rogue Bussing has examined patient and agrees that we can proceed with Alimta today. Patient will receive a vitamin B12 injection today and has been scheduled to return to clinic in 3 weeks on 11/27/18(at MD request) for continued maintenance Alimta on the S130 protocol. Next CT scan is due on 12/15/2018. Current and ongoing adverse events with grade and attribution are as follows:   Adverse Event Log Study/Protocol: [name] S130 Cycle: 629 Event Grade Onset Date Resolved Date Drug Name Pemetrexed Attribution Treatment Comments  Hyponatremia Grade 1 10/17/18 11/07/18  Unlikely  Sodium now 138  Cough Grade 1 09/19/18 10/17/18  Unlikely    Nasal congestion Grade 1 09/19/18 10/17/18  Unlikely    Elevated serum creatinine Grade 1 10/17/18 06/16/18 07/25/18  11/07/18 07/04/18 09/05/18  Possible  Increase fluids Serum creatinine is 1.1mg /dL today  Edema (mild) Grade 1  12/06/2017   Possible  Trace in right and 1+ edema in left ankle  Fatigue Grade1  01/14/16   Possible    Tendonitis Grade 1  09/21/16   Unrelated  Meloxicam  Hypocalcemia Grade 1 05/26/18 06/16/18  Possible  Resolved, Calcium level WNL  Hyperglycemia Grade 1 05/26/18 11/09/15  09/08/16  Unlikely  Non-fasting glucose was 173mg /dL  Thrombocytopenia Grade 1 09/26/18 07/04/18 05/26/18 12/06/17 08/02/17  07/25/18 06/16/18 08/02/17  08/23/17  Possible  Platelet count 141,000 today  Nausea Grade 1 11/16/17 11/17/17  Unrelated  R/T antibiotic  Abrasions on Right knuckles Grade 1 02/03/18 02/28/18  Unrelated  r/t Dog walking accident  Pruritis Grade 1  04/19/17   Possible Uses generic lotion with relief deep itching under the skin of palms, feet & right upper arm   Elevated cholesterol Grade 1 03/15/14   Unrelated  No ongoing monitoring available  Valvular Heart Disease Grade 1 10/19/13   Unrelated    Erectile Dysfunction Grade 2 12/11/15   Possible Takes Cialis   Skin hyperpigmentation Grade 1 07/08/15   Unrelated  Medial aspect of feel bil  Aneurysm of Ascending Aorta Grade 1 10/14/13   Unrelated  mentioned on CT scans  Possible arthritis of Left Knee Grade 1 05/06/15   Unrelated    Elevated BUN Grade 1 07/25/18 08/18/18  Possible    Mitchell Mosley, BSN, MHA, OCN 11/07/2018 3:19 PM

## 2018-11-07 NOTE — Progress Notes (Signed)
[place] OFFICE PROGRESS NOTE  Patient Care Team: [name], MD as PCP - General [facility] Medicine)  Cancer Staging No matching staging information was found for the patient.   Oncology History   # 2012- METASTATIC ADENO CA of LEFT LUNG [acinar pattern] s/p MED LN Bx [NEG- EGFR/K-ras/ALK mutation];[name]; [name]; CT JULY 2016- NED; JAN 2018- NED  # Thoracic Aneurysm [4cm- stable]  ----------------------------------------------------------    DIAGNOSIS: [2012 ] Adenocarcinoma lung  STAGE: 4       ;GOALS: Palliative  CURRENT/MOST RECENT THERAPY _0  Alimta maintenance      Cancer of hilus of left lung ([place])   07/20/2016 Initial Diagnosis    Cancer of hilus of left lung (HCC)       INTERVAL HISTORY:  Mitchell Mosley 57 y.o.  male pleasant patient above history of metastatic adenocarcinoma the lung currently on maintenance Alimta is here for follow-up.  Patient denies any new worsening shortness of breath or cough.  Appetite is good.  No weight loss or bone pain.  No headaches.  Chronic mild swelling in the legs.  No fever chills.  Review of Systems  Constitutional: Negative for chills, diaphoresis, fever, malaise/fatigue and weight loss.  HENT: Negative for nosebleeds and sore throat.   Eyes: Negative for double vision.  Respiratory: Negative for hemoptysis, shortness of breath and wheezing.   Cardiovascular: Positive for leg swelling. Negative for chest pain, palpitations and orthopnea.  Gastrointestinal: Negative for abdominal pain, blood in stool, constipation, diarrhea, heartburn, melena, nausea and vomiting.  Genitourinary: Negative for dysuria, frequency and urgency.  Musculoskeletal: Negative for back pain and joint pain.  Skin: Negative.  Negative for itching and rash.  Neurological: Negative for dizziness, tingling, focal weakness,  weakness and headaches.  Endo/Heme/Allergies: Does not bruise/bleed easily.  Psychiatric/Behavioral: Negative for depression. The patient is not nervous/anxious and does not have insomnia.       PAST MEDICAL HISTORY :  Past Medical History:  Diagnosis Date  . Allergy   . Lung cancer ([place])   . Mild valvular heart disease Nov. 2014   per 2 D Echocardiogram done for persistent leg edema, monitored by cardiology  . Peripheral edema     PAST SURGICAL HISTORY :   Past Surgical History:  Procedure Laterality Date  . ELBOW SURGERY    . Left anterior thoracotomy with biopsy  March 2012  . PORTACATH PLACEMENT  March 2012  . TONSILLECTOMY      FAMILY HISTORY :   Family History  Problem Relation Age of Onset  . Leukemia Father   . Diabetes Mother     SOCIAL HISTORY:   Social History   Tobacco Use  . Smoking status: Never Smoker  . Smokeless tobacco: Never Used  Substance Use Topics  . Alcohol use: Yes    Alcohol/week: 0.0 standard drinks    Comment: "social drinker"   . Drug use: No    ALLERGIES:  is allergic to penicillins.  MEDICATIONS:  Current Outpatient Medications  Medication Sig Dispense Refill  . folic acid (FOLVITE) 1 MG tablet TAKE 1 TABLET (1 MG TOTAL) BY MOUTH DAILY. 30 tablet 11  . furosemide (LASIX) 20 MG tablet TAKE 1 TABLET (20 MG TOTAL) BY MOUTH DAILY. ONE TABLET DAILY AS NEEDED FOR SWELLING 90 tablet 1  . loratadine (CLARITIN) 10 MG tablet Take 1 tablet (10 mg total) by mouth daily.    Marland Kitchen  meloxicam (MOBIC) 7.5 MG tablet Take 7.5 mg by mouth daily.     . Multiple Vitamin (MULTIVITAMIN) capsule Take 1 capsule by mouth daily. Reported on 02/24/2016    . Omega-3 Fatty Acids (FISH OIL) 1000 MG CAPS Take 1 capsule by mouth daily. Reported on 02/24/2016    . senna (SENOKOT) 8.6 MG tablet Take 1 tablet by mouth daily. Take as needed around time of chemotherapy    . spironolactone (ALDACTONE) 25 MG tablet TAKE 1 TABLET BY MOUTH EVERY DAY 90 tablet 3  . tadalafil  (ADCIRCA/CIALIS) 20 MG tablet TAKE 1 TABLET BY MOUTH EVERY 2 DAYS AS NEEDED. 6 tablet 11  . vitamin B-12 (CYANOCOBALAMIN) 1000 MCG tablet Take 1,000 mcg by mouth daily.     No current facility-administered medications for this visit.    Facility-Administered Medications Ordered in Other Visits  Medication Dose Route Frequency Provider Last Rate Last Dose  . INV-PEMEtrexed Lilly S130 (ALIMTA) 1,050 mg in sodium chloride 0.9 % 100 mL chemo infusion  500 mg/m2 (Treatment Plan Recorded) Intravenous Once [name], MD      . ondansetron (ZOFRAN) 8 mg, dexamethasone (DECADRON) 10 mg in sodium chloride 0.9 % 50 mL IVPB   Intravenous Once Charlaine Dalton R, [name], MD   10 mL at 05/27/15 1543  . sodium chloride 0.9 % injection 10 mL  10 mL Intracatheter PRN [name], MD   10 mL at 06/17/15 1510  . sodium chloride 0.9 % injection 10 mL  10 mL Intravenous PRN [name], MD   10 mL at 07/08/15 1340    PHYSICAL EXAMINATION: ECOG PERFORMANCE STATUS: 0 - Asymptomatic  BP 133/78 (BP Location: Left Arm, Patient Position: Sitting)   Pulse 85   Temp 99.1 F (37.3 C) (Oral)   Resp 18   Ht _0  (1.778 m)   Wt 214 lb (97.1 kg)   BMI 30.71 kg/m   Filed Weights   11/07/18 1407  Weight: 214 lb (97.1 kg)    Physical Exam  Constitutional: He is oriented to person, place, and time and well-developed, well-nourished, and in no distress.  HENT:  Head: Normocephalic and atraumatic.  Mouth/Throat: Oropharynx is clear and moist. No oropharyngeal exudate.  Eyes: Pupils are equal, round, and reactive to light.  Neck: Normal range of motion. Neck supple.  Cardiovascular: Normal rate and regular rhythm.  Pulmonary/Chest: No respiratory distress. He has no wheezes.  Abdominal: Soft. Bowel sounds are normal. He exhibits no distension and no mass. There is no tenderness. There is no rebound and no  guarding.  Musculoskeletal: Normal range of motion. He exhibits edema. He exhibits no tenderness.  Grade 1 swelling in the legs bilaterally.  Neurological: He is alert and oriented to person, place, and time.  Skin: Skin is warm.  Psychiatric: Affect normal.       LABORATORY DATA:  I have reviewed the data as listed    Component Value Date/Time   NA 138 11/07/2018 1355   NA 138 12/10/2014 1341   K 4.0 11/07/2018 1355   K 3.6 12/10/2014 1341   CL 104 11/07/2018 1355   CL 102 12/10/2014 1341   CO2 25 11/07/2018 1355   CO2 30 12/10/2014 1341   GLUCOSE 194 (H) 11/07/2018 1355   GLUCOSE 160 (H) 12/10/2014 1341   BUN 19 11/07/2018 1355   BUN 21 (H) 12/10/2014 1341  CREATININE 1.10 11/07/2018 1355   CREATININE 1.10 03/25/2015 1453   CALCIUM 8.9 11/07/2018 1355   CALCIUM 8.4 (L) 12/10/2014 1341   PROT 6.7 11/07/2018 1355   PROT 6.6 12/10/2014 1341   ALBUMIN 3.9 11/07/2018 1355   ALBUMIN 3.4 12/10/2014 1341   AST 26 11/07/2018 1355   AST 17 12/10/2014 1341   ALT 18 11/07/2018 1355   ALT 23 12/10/2014 1341   ALKPHOS 34 (L) 11/07/2018 1355   ALKPHOS 50 12/10/2014 1341   BILITOT 0.9 11/07/2018 1355   BILITOT 0.5 12/10/2014 1341   GFRNONAA >60 11/07/2018 1355   GFRNONAA >60 03/25/2015 1453   GFRAA >60 11/07/2018 1355   GFRAA >60 03/25/2015 1453    No results found for: SPEP, UPEP  Lab Results  Component Value Date   WBC 5.2 11/07/2018   NEUTROABS 3.2 11/07/2018   HGB 13.6 11/07/2018   HCT 40.1 11/07/2018   MCV 93.5 11/07/2018   PLT 141 (L) 11/07/2018      Chemistry      Component Value Date/Time   NA 138 11/07/2018 1355   NA 138 12/10/2014 1341   K 4.0 11/07/2018 1355   K 3.6 12/10/2014 1341   CL 104 11/07/2018 1355   CL 102 12/10/2014 1341   CO2 25 11/07/2018 1355   CO2 30 12/10/2014 1341   BUN 19 11/07/2018 1355   BUN 21 (H) 12/10/2014 1341   CREATININE 1.10 11/07/2018 1355   CREATININE 1.10 03/25/2015 1453      Component Value Date/Time   CALCIUM  8.9 11/07/2018 1355   CALCIUM 8.4 (L) 12/10/2014 1341   ALKPHOS 34 (L) 11/07/2018 1355   ALKPHOS 50 12/10/2014 1341   AST 26 11/07/2018 1355   AST 17 12/10/2014 1341   ALT 18 11/07/2018 1355   ALT 23 12/10/2014 1341   BILITOT 0.9 11/07/2018 1355   BILITOT 0.5 12/10/2014 1341       RADIOGRAPHIC STUDIES: I have personally reviewed the radiological images as listed and agreed with the findings in the report. No results found.   ASSESSMENT & PLAN:  Cancer of hilus of left lung (HCC) #Adenocarcinoma the lung; stage IV; September 30 CT scan chest -subcentimeter right lower lobe peri-bronchial opacities noted-likely inflammatory ; no obvious evidence of recurrence noted.  STABLE.   # Proceed with Alimta; Labs today reviewed;  acceptable for treatment today.   # Bilateral lower extremity swelling-intermittent-STABLE.   # Chronic joint pains-NSAIDs as needed.  STABLE.   # DISPOSITION:  #  B12 injection;Treatment-today # follow up on 12/26/ labs/alimta/MD- Dr.B   No orders of the defined types were placed in this encounter.  All questions were answered. The patient knows to call the clinic with any problems, questions or concerns.      [name], MD 11/07/2018 3:11 PM

## 2018-11-07 NOTE — Assessment & Plan Note (Addendum)
#  Adenocarcinoma the lung; stage IV; September 30 CT scan chest -subcentimeter right lower lobe peri-bronchial opacities noted-likely inflammatory ; no obvious evidence of recurrence noted.  STABLE.   # Proceed with Alimta; Labs today reviewed;  acceptable for treatment today.   # Bilateral lower extremity swelling-intermittent-STABLE.   # Chronic joint pains-NSAIDs as needed.  STABLE.   # DISPOSITION:  #  B12 injection;Treatment-today # follow up on 12/26/ labs/alimta/MD- Dr.B

## 2018-11-27 ENCOUNTER — Other Ambulatory Visit: Payer: Self-pay

## 2018-11-27 ENCOUNTER — Encounter: Payer: Self-pay | Admitting: *Deleted

## 2018-11-27 ENCOUNTER — Inpatient Hospital Stay (HOSPITAL_BASED_OUTPATIENT_CLINIC_OR_DEPARTMENT_OTHER): Payer: BLUE CROSS/BLUE SHIELD | Admitting: Internal Medicine

## 2018-11-27 ENCOUNTER — Inpatient Hospital Stay: Payer: BLUE CROSS/BLUE SHIELD

## 2018-11-27 VITALS — BP 126/81 | HR 79 | Temp 97.6°F | Resp 20 | Ht 70.0 in | Wt 212.0 lb

## 2018-11-27 DIAGNOSIS — M7989 Other specified soft tissue disorders: Secondary | ICD-10-CM | POA: Diagnosis not present

## 2018-11-27 DIAGNOSIS — C3402 Malignant neoplasm of left main bronchus: Secondary | ICD-10-CM

## 2018-11-27 DIAGNOSIS — Z006 Encounter for examination for normal comparison and control in clinical research program: Secondary | ICD-10-CM | POA: Diagnosis not present

## 2018-11-27 DIAGNOSIS — G8929 Other chronic pain: Secondary | ICD-10-CM | POA: Diagnosis not present

## 2018-11-27 DIAGNOSIS — Z5112 Encounter for antineoplastic immunotherapy: Secondary | ICD-10-CM | POA: Diagnosis not present

## 2018-11-27 DIAGNOSIS — Z5111 Encounter for antineoplastic chemotherapy: Secondary | ICD-10-CM

## 2018-11-27 DIAGNOSIS — M2550 Pain in unspecified joint: Secondary | ICD-10-CM

## 2018-11-27 DIAGNOSIS — C3490 Malignant neoplasm of unspecified part of unspecified bronchus or lung: Secondary | ICD-10-CM

## 2018-11-27 LAB — CBC WITH DIFFERENTIAL/PLATELET
Abs Immature Granulocytes: 0.04 10*3/uL (ref 0.00–0.07)
Basophils Absolute: 0 10*3/uL (ref 0.0–0.1)
Basophils Relative: 1 %
Eosinophils Absolute: 0.2 10*3/uL (ref 0.0–0.5)
Eosinophils Relative: 4 %
HCT: 40.6 % (ref 39.0–52.0)
Hemoglobin: 13.8 g/dL (ref 13.0–17.0)
Immature Granulocytes: 1 %
LYMPHS ABS: 1.5 10*3/uL (ref 0.7–4.0)
Lymphocytes Relative: 27 %
MCH: 31.5 pg (ref 26.0–34.0)
MCHC: 34 g/dL (ref 30.0–36.0)
MCV: 92.7 fL (ref 80.0–100.0)
Monocytes Absolute: 0.6 10*3/uL (ref 0.1–1.0)
Monocytes Relative: 11 %
Neutro Abs: 3.1 10*3/uL (ref 1.7–7.7)
Neutrophils Relative %: 56 %
Platelets: 174 10*3/uL (ref 150–400)
RBC: 4.38 MIL/uL (ref 4.22–5.81)
RDW: 12.4 % (ref 11.5–15.5)
WBC: 5.4 10*3/uL (ref 4.0–10.5)
nRBC: 0 % (ref 0.0–0.2)

## 2018-11-27 LAB — COMPREHENSIVE METABOLIC PANEL
ALT: 18 U/L (ref 0–44)
AST: 24 U/L (ref 15–41)
Albumin: 4.1 g/dL (ref 3.5–5.0)
Alkaline Phosphatase: 38 U/L (ref 38–126)
Anion gap: 8 (ref 5–15)
BUN: 15 mg/dL (ref 6–20)
CHLORIDE: 103 mmol/L (ref 98–111)
CO2: 25 mmol/L (ref 22–32)
Calcium: 8.9 mg/dL (ref 8.9–10.3)
Creatinine, Ser: 1.08 mg/dL (ref 0.61–1.24)
GFR calc Af Amer: >60 mL/min (ref >60)
GFR calc non Af Amer: >60 mL/min (ref >60)
Glucose, Bld: 107 mg/dL — ABNORMAL HIGH (ref 70–99)
Potassium: 4.3 mmol/L (ref 3.5–5.1)
Sodium: 136 mmol/L (ref 135–145)
Total Bilirubin: 0.5 mg/dL (ref 0.3–1.2)
Total Protein: 7.2 g/dL (ref 6.5–8.1)

## 2018-11-27 LAB — URINALYSIS, COMPLETE (UACMP) WITH MICROSCOPIC
Bacteria, UA: NONE SEEN
Bilirubin Urine: NEGATIVE
Glucose, UA: NEGATIVE mg/dL
Hgb urine dipstick: NEGATIVE
Ketones, ur: NEGATIVE mg/dL
Leukocytes, UA: NEGATIVE
Nitrite: NEGATIVE
PH: 5 (ref 5.0–8.0)
Protein, ur: NEGATIVE mg/dL
Specific Gravity, Urine: 1.004 — ABNORMAL LOW (ref 1.005–1.030)
Squamous Epithelial / HPF: NONE SEEN (ref 0–5)
WBC, UA: NONE SEEN WBC/hpf (ref 0–5)

## 2018-11-27 LAB — MAGNESIUM: Magnesium: 1.8 mg/dL (ref 1.7–2.4)

## 2018-11-27 MED ORDER — HEPARIN SOD (PORK) LOCK FLUSH 100 UNIT/ML IV SOLN
500.0000 [IU] | Freq: Once | INTRAVENOUS | Status: AC
  Administered 2018-11-27: 500 [IU] via INTRAVENOUS
  Filled 2018-11-27: qty 5

## 2018-11-27 MED ORDER — SODIUM CHLORIDE 0.9 % IV SOLN
Freq: Once | INTRAVENOUS | Status: AC
  Administered 2018-11-27: 12:00:00 via INTRAVENOUS
  Filled 2018-11-27: qty 250

## 2018-11-27 MED ORDER — SODIUM CHLORIDE 0.9 % IV SOLN
500.0000 mg/m2 | Freq: Once | INTRAVENOUS | Status: AC
  Administered 2018-11-27: 1050 mg via INTRAVENOUS
  Filled 2018-11-27: qty 42

## 2018-11-27 MED ORDER — CYANOCOBALAMIN 1000 MCG/ML IJ SOLN: 1000.0000 ug | Freq: Once | INTRAMUSCULAR | Status: DC

## 2018-11-27 MED ORDER — SODIUM CHLORIDE 0.9 % IV SOLN
Freq: Once | INTRAVENOUS | Status: AC
  Administered 2018-11-27: 12:00:00 via INTRAVENOUS
  Filled 2018-11-27: qty 4

## 2018-11-27 MED ORDER — SODIUM CHLORIDE 0.9% FLUSH
10.0000 mL | INTRAVENOUS | Status: DC | PRN
  Administered 2018-11-27: 10 mL via INTRAVENOUS
  Filled 2018-11-27: qty 10

## 2018-11-27 NOTE — Progress Notes (Signed)
[place] OFFICE PROGRESS NOTE  Patient Care Team: [name], MD as PCP - General [facility] Medicine)  Cancer Staging No matching staging information was found for the patient.   Oncology History   # 2012- METASTATIC ADENO CA of LEFT LUNG [acinar pattern] s/p MED LN Bx [NEG- EGFR/K-ras/ALK mutation];[name]; [name]; CT JULY 2016- NED; JAN 2018- NED  # Thoracic Aneurysm [4cm- stable]  ----------------------------------------------------------    DIAGNOSIS: [2012 ] Adenocarcinoma lung  STAGE: 4       ;GOALS: Palliative  CURRENT/MOST RECENT THERAPY _0  Alimta maintenance      Cancer of hilus of left lung ([place])   07/20/2016 Initial Diagnosis    Cancer of hilus of left lung (HCC)       INTERVAL HISTORY:  Mitchell Mosley 57 y.o.  male pleasant patient above history of metastatic adenocarcinoma the lung currently on maintenance Alimta is here for follow-up.  Patient has chronic mild swelling in the legs mostly dependent.  Otherwise he continues to be physically active.  No new shortness of breath or cough no fevers or chills.  Review of Systems  Constitutional: Negative for chills, diaphoresis, fever, malaise/fatigue and weight loss.  HENT: Negative for nosebleeds and sore throat.   Eyes: Negative for double vision.  Respiratory: Negative for hemoptysis, shortness of breath and wheezing.   Cardiovascular: Positive for leg swelling. Negative for chest pain, palpitations and orthopnea.  Gastrointestinal: Negative for abdominal pain, blood in stool, constipation, diarrhea, heartburn, melena, nausea and vomiting.  Genitourinary: Negative for dysuria, frequency and urgency.  Musculoskeletal: Negative for back pain and joint pain.  Skin: Negative.  Negative for itching and rash.  Neurological: Negative for dizziness, tingling, focal weakness, weakness  and headaches.  Endo/Heme/Allergies: Does not bruise/bleed easily.  Psychiatric/Behavioral: Negative for depression. The patient is not nervous/anxious and does not have insomnia.       PAST MEDICAL HISTORY :  Past Medical History:  Diagnosis Date  . Allergy   . Lung cancer ([place])   . Mild valvular heart disease Nov. 2014   per 2 D Echocardiogram done for persistent leg edema, monitored by cardiology  . Peripheral edema     PAST SURGICAL HISTORY :   Past Surgical History:  Procedure Laterality Date  . ELBOW SURGERY    . Left anterior thoracotomy with biopsy  March 2012  . PORTACATH PLACEMENT  March 2012  . TONSILLECTOMY      FAMILY HISTORY :   Family History  Problem Relation Age of Onset  . Leukemia Father   . Diabetes Mother     SOCIAL HISTORY:   Social History   Tobacco Use  . Smoking status: Never Smoker  . Smokeless tobacco: Never Used  Substance Use Topics  . Alcohol use: Yes    Alcohol/week: 0.0 standard drinks    Comment: "social drinker"   . Drug use: No    ALLERGIES:  is allergic to penicillins.  MEDICATIONS:  Current Outpatient Medications  Medication Sig Dispense Refill  . folic acid (FOLVITE) 1 MG tablet TAKE 1 TABLET (1 MG TOTAL) BY MOUTH DAILY. 30 tablet 11  . furosemide (LASIX) 20 MG tablet TAKE 1 TABLET (20 MG TOTAL) BY MOUTH DAILY. ONE TABLET DAILY AS NEEDED FOR SWELLING 90 tablet 1  . meloxicam (MOBIC) 7.5 MG tablet Take 7.5 mg by mouth daily.     Marland Kitchen spironolactone (ALDACTONE) 25 MG tablet TAKE  1 TABLET BY MOUTH EVERY DAY 90 tablet 3  . loratadine (CLARITIN) 10 MG tablet Take 1 tablet (10 mg total) by mouth daily. (Patient not taking: Reported on 11/27/2018)    . Multiple Vitamin (MULTIVITAMIN) capsule Take 1 capsule by mouth daily. Reported on 02/24/2016    . Omega-3 Fatty Acids (FISH OIL) 1000 MG CAPS Take 1 capsule by mouth daily. Reported on 02/24/2016    . senna (SENOKOT) 8.6 MG tablet Take 1 tablet by mouth daily. Take as needed around  time of chemotherapy    . tadalafil (ADCIRCA/CIALIS) 20 MG tablet TAKE 1 TABLET BY MOUTH EVERY 2 DAYS AS NEEDED. (Patient not taking: Reported on 11/27/2018) 6 tablet 11  . vitamin B-12 (CYANOCOBALAMIN) 1000 MCG tablet Take 1,000 mcg by mouth daily.     No current facility-administered medications for this visit.    Facility-Administered Medications Ordered in Other Visits  Medication Dose Route Frequency Provider Last Rate Last Dose  . sodium chloride 0.9 % injection 10 mL  10 mL Intracatheter PRN [name], MD   10 mL at 05/27/15 1543  . sodium chloride 0.9 % injection 10 mL  10 mL Intracatheter PRN [name], MD   10 mL at 06/17/15 1510  . sodium chloride 0.9 % injection 10 mL  10 mL Intravenous PRN [name], MD   10 mL at 07/08/15 1340    PHYSICAL EXAMINATION: ECOG PERFORMANCE STATUS: 0 - Asymptomatic  BP 126/81 (BP Location: Left Arm, Patient Position: Sitting)   Pulse 79   Temp 97.6 F (36.4 C) (Tympanic)   Resp 20   Ht _0  (1.778 m)   Wt 212 lb (96.2 kg)   BMI 30.42 kg/m   Filed Weights   11/27/18 1115  Weight: 212 lb (96.2 kg)    Physical Exam  Constitutional: He is oriented to person, place, and time and well-developed, well-nourished, and in no distress.  HENT:  Head: Normocephalic and atraumatic.  Mouth/Throat: Oropharynx is clear and moist. No oropharyngeal exudate.  Eyes: Pupils are equal, round, and reactive to light.  Neck: Normal range of motion. Neck supple.  Cardiovascular: Normal rate and regular rhythm.  Pulmonary/Chest: No respiratory distress. He has no wheezes.  Abdominal: Soft. Bowel sounds are normal. He exhibits no distension and no mass. There is no abdominal tenderness. There is no rebound and no guarding.  Musculoskeletal: Normal range of motion.        General: Edema present. No tenderness.     Comments: Grade 1 swelling in the legs bilaterally.  Neurological: He is alert and oriented to person, place, and time.  Skin:  Skin is warm.  Psychiatric: Affect normal.       LABORATORY DATA:  I have reviewed the data as listed    Component Value Date/Time   NA 136 11/27/2018 1049   NA 138 12/10/2014 1341   K 4.3 11/27/2018 1049   K 3.6 12/10/2014 1341   CL 103 11/27/2018 1049   CL 102 12/10/2014 1341   CO2 25 11/27/2018 1049   CO2 30 12/10/2014 1341   GLUCOSE 107 (H) 11/27/2018 1049   GLUCOSE 160 (H) 12/10/2014 1341   BUN 15 11/27/2018 1049   BUN 21 (H) 12/10/2014 1341   CREATININE 1.08 11/27/2018 1049   CREATININE 1.10 03/25/2015 1453   CALCIUM 8.9 11/27/2018 1049   CALCIUM 8.4 (L) 12/10/2014 1341   PROT 7.2 11/27/2018 1049   PROT 6.6 12/10/2014 1341   ALBUMIN 4.1 11/27/2018 1049   ALBUMIN  3.4 12/10/2014 1341   AST 24 11/27/2018 1049   AST 17 12/10/2014 1341   ALT 18 11/27/2018 1049   ALT 23 12/10/2014 1341   ALKPHOS 38 11/27/2018 1049   ALKPHOS 50 12/10/2014 1341   BILITOT 0.5 11/27/2018 1049   BILITOT 0.5 12/10/2014 1341   GFRNONAA >60 11/27/2018 1049   GFRNONAA >60 03/25/2015 1453   GFRAA >60 11/27/2018 1049   GFRAA >60 03/25/2015 1453    No results found for: SPEP, UPEP  Lab Results  Component Value Date   WBC 5.4 11/27/2018   NEUTROABS 3.1 11/27/2018   HGB 13.8 11/27/2018   HCT 40.6 11/27/2018   MCV 92.7 11/27/2018   PLT 174 11/27/2018      Chemistry      Component Value Date/Time   NA 136 11/27/2018 1049   NA 138 12/10/2014 1341   K 4.3 11/27/2018 1049   K 3.6 12/10/2014 1341   CL 103 11/27/2018 1049   CL 102 12/10/2014 1341   CO2 25 11/27/2018 1049   CO2 30 12/10/2014 1341   BUN 15 11/27/2018 1049   BUN 21 (H) 12/10/2014 1341   CREATININE 1.08 11/27/2018 1049   CREATININE 1.10 03/25/2015 1453      Component Value Date/Time   CALCIUM 8.9 11/27/2018 1049   CALCIUM 8.4 (L) 12/10/2014 1341   ALKPHOS 38 11/27/2018 1049   ALKPHOS 50 12/10/2014 1341   AST 24 11/27/2018 1049   AST 17 12/10/2014 1341   ALT 18 11/27/2018 1049   ALT 23 12/10/2014 1341   BILITOT  0.5 11/27/2018 1049   BILITOT 0.5 12/10/2014 1341       RADIOGRAPHIC STUDIES: I have personally reviewed the radiological images as listed and agreed with the findings in the report. No results found.   ASSESSMENT & PLAN:  Cancer of hilus of left lung (HCC) #Adenocarcinoma the lung; stage IV; September 30 CT scan chest -subcentimeter right lower lobe peri-bronchial opacities noted-likely inflammatory ; no obvious evidence of recurrence noted.  Stable.  # Proceed with Alimta; Labs today reviewed;  acceptable for treatment today.   # Bilateral lower extremity swelling-intermittent-stable.   # Chronic joint pains-NSAIDs as needed.  Stable.   # DISPOSITION:  #  Treatment-today # follow up in 3 weeks/friday-cbc/cmp-Alimta;,CT C/A/P on jan 13th-Dr.B   Orders Placed This Encounter  Procedures  . CT CHEST W CONTRAST    Standing Status:   Future    Standing Expiration Date:   11/28/2019    Order Specific Question:   If indicated for the ordered procedure, I authorize the administration of contrast media per Radiology protocol    Answer:   Yes    Order Specific Question:   Preferred imaging location?    Answer:   [facility]    Order Specific Question:   Radiology Contrast Protocol - do NOT remove file path    Answer:   [email or web address]    Order Specific Question:   ** REASON FOR EXAM (FREE TEXT)    Answer:   lung cancer  . CT Abdomen Pelvis W Contrast    Standing Status:   Future    Standing Expiration Date:   11/27/2019    Order Specific Question:   ** REASON FOR EXAM (FREE TEXT)    Answer:   lung cancer    Order Specific Question:   If indicated for the ordered procedure, I authorize the administration of contrast media per Radiology protocol    Answer:   Yes  Order Specific Question:   Preferred imaging location?    Answer:   [facility]    Order Specific Question:   Is Oral Contrast requested for this exam?    Answer:   Yes, Per  Radiology protocol    Order Specific Question:   Radiology Contrast Protocol - do NOT remove file path    Answer:   [email or web address]   All questions were answered. The patient knows to call the clinic with any problems, questions or concerns.      [name], MD 12/02/2018 12:23 PM

## 2018-11-27 NOTE — Progress Notes (Signed)
Mr. Stefanski returns to clinic today for consideration of cycle #631 Alimta infusion on the [employer] research study. Continues to deny any new adverse events since last in the clinic. Patient continues to experience mild (1+) edema in his BLE, mostly in his ankles, and reports he is still taking both Lasix and Spironolactone daily for the edema. He also continues to experience the odd itching in his palms, the bottoms of his feet sporadically and in his right upper inner arm, which is relieved with use of generic skin lotion. Reports mild fatigue that begins shortly after infusion, which lasts 3-4 days, then he gradually returns to normal energy level. Patient's VS remain stable today. His weight is 96.2kg today. Mr. Bogdanski also verifies that he is taking Folic Acid and Mobic daily as prescribed. Patient's glucose level is much lower today at 107mg /dL non-fasting; which is still elevated at Grade 1. Platelets have increased to 174,000K/uL. Sodium level is normal today at 167mmol/L, and serum creatinine is now wnl at 1.08mg /dL. CBC and chemistry levels are otherwise within acceptable parameters for patient to receive his Alimta infusion and Dr. Rogue Bussing has examined patient and agrees that we can proceed with Alimta today. Patient received a vitamin B12 injection last week and is due for his CT scan on 12/15/2018, which has been scheduled. He will return to clinic in 3 weeks on 12/19/2018 for continued maintenance Alimta on the S130 protocol. Current and ongoing adverse events with grade and attribution are as follows:   Adverse Event Log Study/Protocol: [name] S130 Cycle: 630 Event Grade Onset Date Resolved Date Drug Name Pemetrexed Attribution Treatment Comments  Hyponatremia Grade 1 10/17/18 11/07/18  Unlikely  Sodium now 138  Cough Grade 1 09/19/18 10/17/18  Unlikely    Nasal congestion Grade 1 09/19/18 10/17/18  Unlikely    Elevated serum creatinine Grade 1 10/17/18 06/16/18 07/25/18  11/07/18 07/04/18 09/05/18  Possible  Increase fluids Serum creatinine is 1.1mg /dL today  Edema (mild) Grade 1  12/06/2017   Possible  Trace in right and 1+ edema in left ankle  Fatigue Grade1  01/14/16   Possible    Tendonitis Grade 1  09/21/16   Unrelated  Meloxicam  Hypocalcemia Grade 1 05/26/18 06/16/18  Possible  Resolved, Calcium level WNL  Hyperglycemia Grade 1 05/26/18 11/09/15  09/08/16  Unlikely  Non-fasting glucose was 107mg /dL  Thrombocytopenia Grade 1 09/26/18 07/04/18 05/26/18 12/06/17 08/02/17 11/27/18 07/25/18 06/16/18 08/02/17  08/23/17  Possible  Platelet count 174,000 today  Nausea Grade 1 11/16/17 11/17/17  Unrelated  R/T antibiotic  Abrasions on Right knuckles Grade 1 02/03/18 02/28/18  Unrelated  r/t Dog walking accident  Pruritis Grade 1  04/19/17   Possible Uses generic lotion with relief deep itching under the skin of palms, feet & right upper arm   Elevated cholesterol Grade 1 03/15/14   Unrelated  No ongoing monitoring available  Valvular Heart Disease Grade 1 10/19/13   Unrelated    Erectile Dysfunction Grade 2 12/11/15   Possible Takes Cialis   Skin hyperpigmentation Grade 1 07/08/15   Unrelated  Medial aspect of feel bil  Aneurysm of Ascending Aorta Grade 1 10/14/13   Unrelated  mentioned on CT scans  Possible arthritis of Left Knee Grade 1 05/06/15   Unrelated    Elevated BUN Grade 1 07/25/18 08/18/18  Possible    Yolande Jolly, BSN, MHA, OCN 11/27/2018  11:40 AM

## 2018-11-27 NOTE — Assessment & Plan Note (Addendum)
#  Adenocarcinoma the lung; stage IV; September 30 CT scan chest -subcentimeter right lower lobe peri-bronchial opacities noted-likely inflammatory ; no obvious evidence of recurrence noted.  Stable.  # Proceed with Alimta; Labs today reviewed;  acceptable for treatment today.  Ordered scans prior to next visit.  # Bilateral lower extremity swelling-intermittent-stable.   # Chronic joint pains-NSAIDs as needed.  Stable.   # DISPOSITION:  #  Treatment-today # follow up in 3 weeks/friday-cbc/cmp-Alimta;,CT C/A/P on jan 13th-Dr.B

## 2018-12-11 IMAGING — CT CT CHEST W/ CM
2 of 5 series · 14 of 30 positions shown, 16 images · IV contrast (iopamidol)
Comparison: CT 07/08/2017 and 03/25/2017.

CLINICAL DATA: Metastatic non-small cell (adenocarcinoma) of the
lung. On maintenance Alimta therapy since May 2011.

EXAM:
CT CHEST AND ABDOMEN WITH CONTRAST
TECHNIQUE: Multidetector CT imaging of the chest and abdomen was performed
following the standard protocol during bolus administration of
intravenous contrast.
CONTRAST:  100mL [id number] IOPAMIDOL ([id number]) INJECTION 61%

[Series 2: cap with · axial · 0.71mm/px · z∈[-790,-465]mm · 6 of 92 slices shown, 8 images]
[im 14/92  mediastinal]
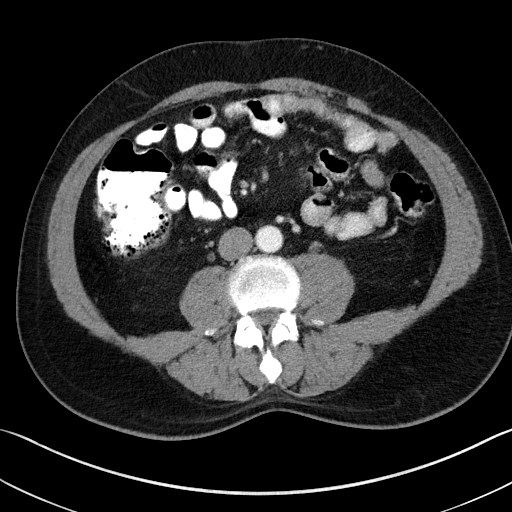
[im 14/92  lung]
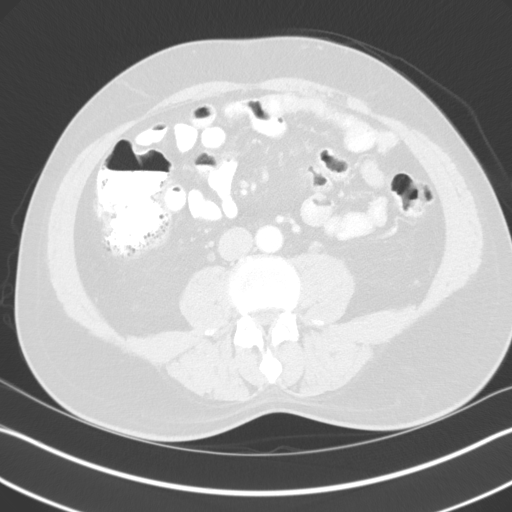
[im 27/92  lung]
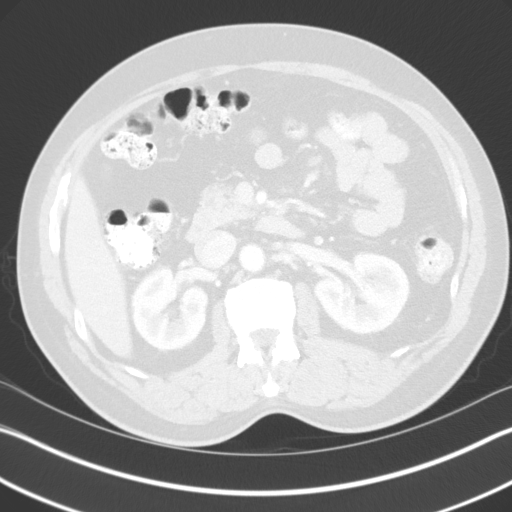
[im 40/92  lung]
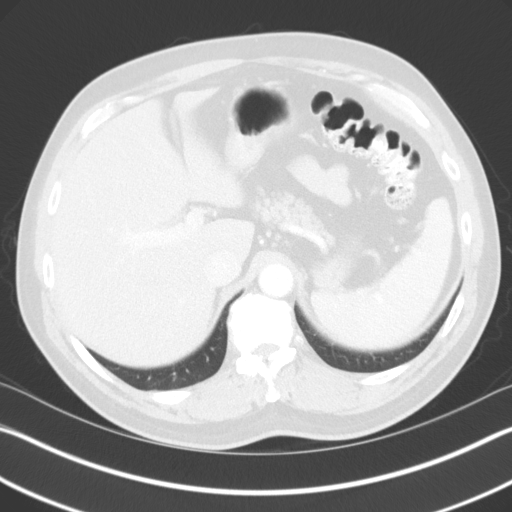
[im 53/92  lung]
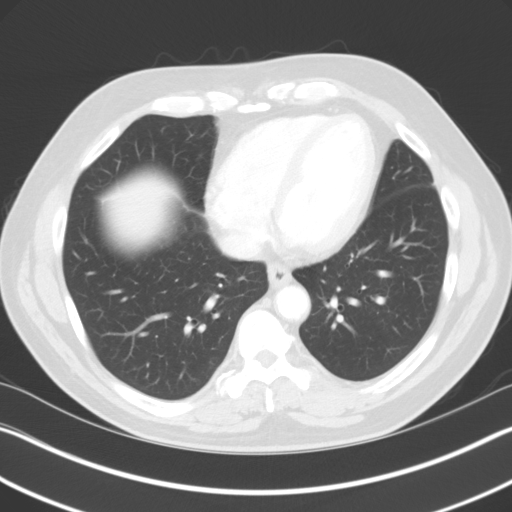
[im 66/92  mediastinal]
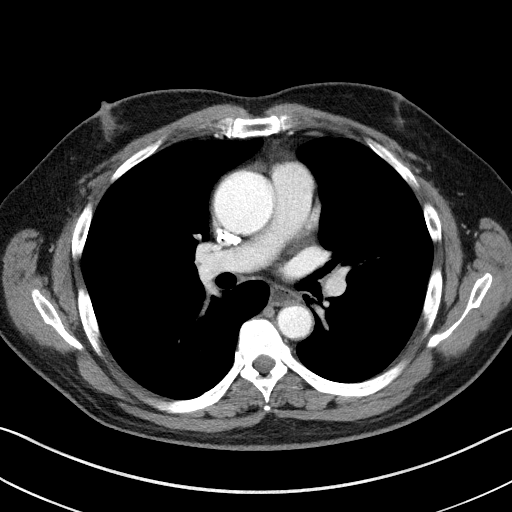
[im 66/92  lung]
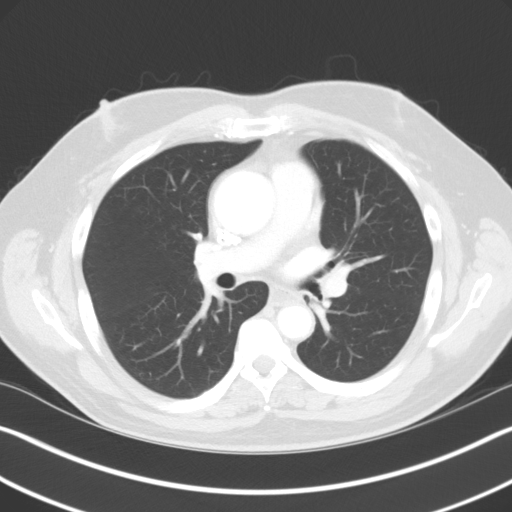
[im 79/92  lung]
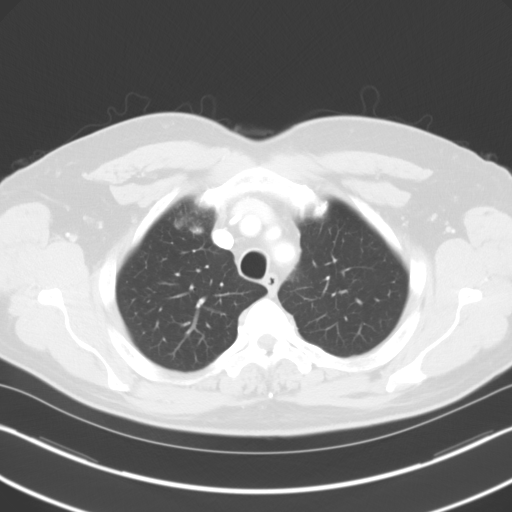

[Series 4: lung · axial · 0.71mm/px · z∈[-698,-422]mm · 8 of 160 slices shown]
[im 11/160  lung]
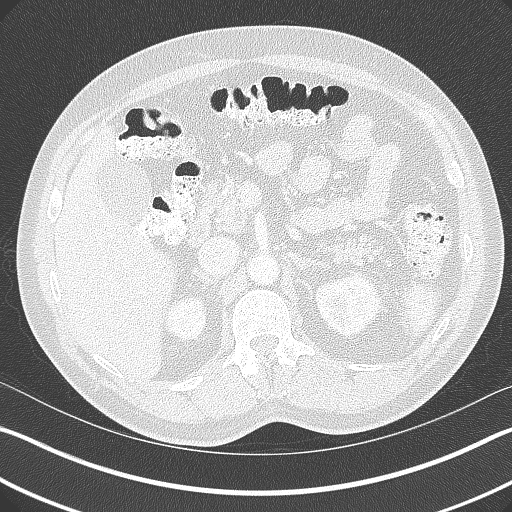
[im 32/160  lung]
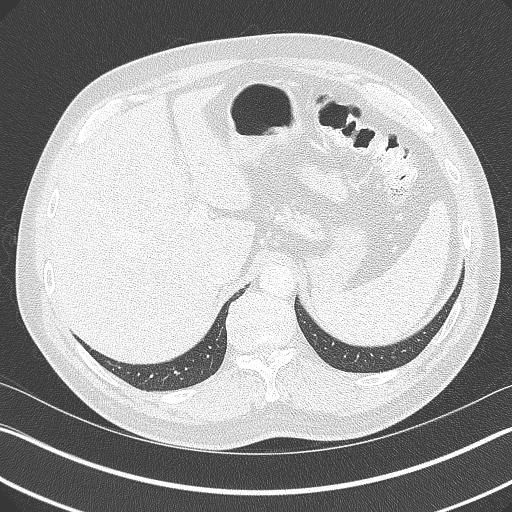
[im 54/160  lung]
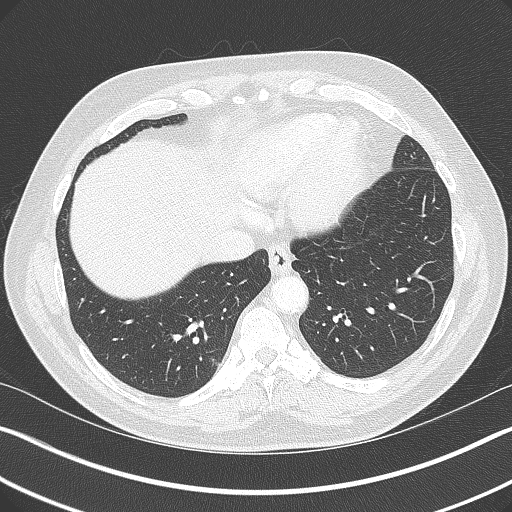
[im 75/160  lung]
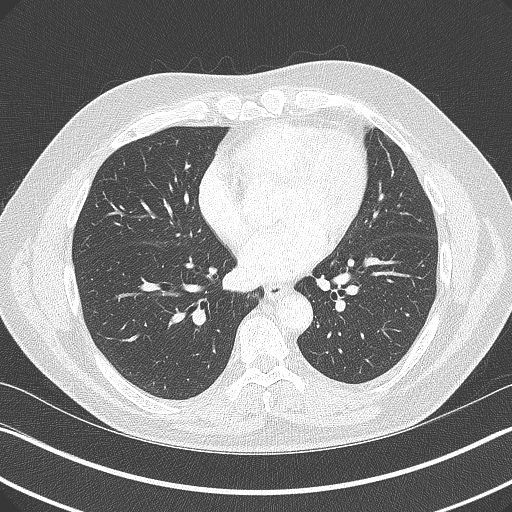
[im 85/160  lung]
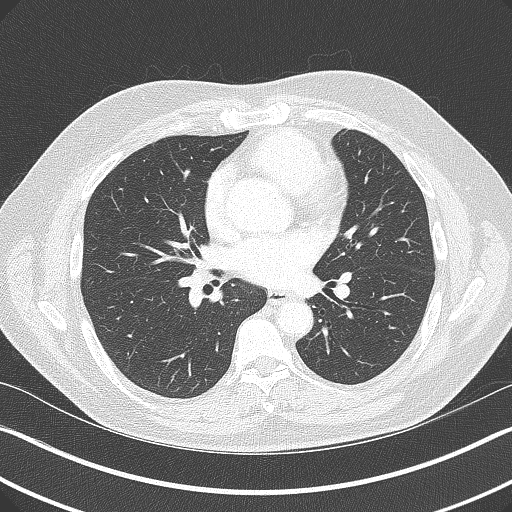
[im 107/160  lung]
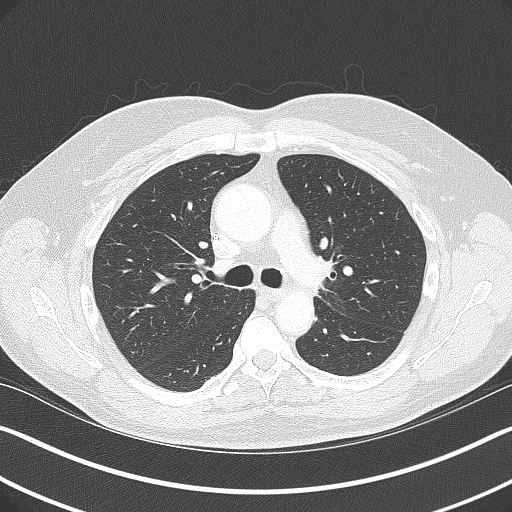
[im 128/160  lung]
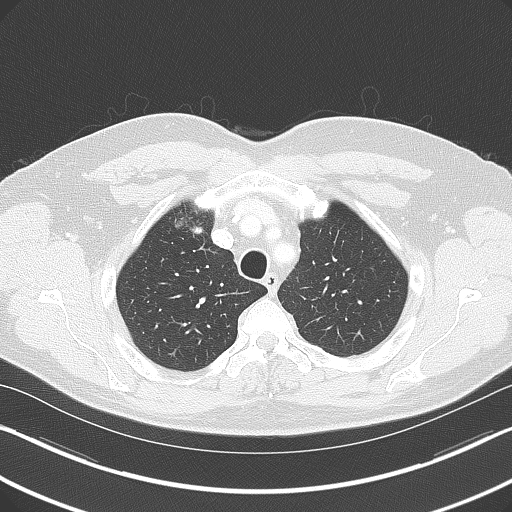
[im 149/160  lung]
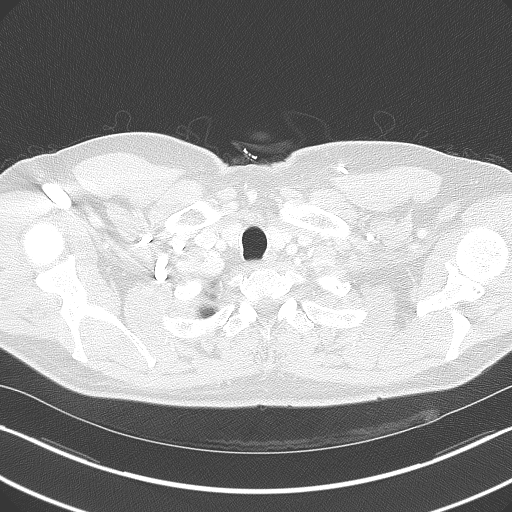

[14 of 30 positions shown; findings below may reference images not displayed]

FINDINGS: RECIST

Target Lesions:

1. None.
Non-target Lesions:

1. None.
CT CHEST FINDINGS

Cardiovascular: No acute vascular findings are demonstrated. There
is stable mild dilatation of the ascending aorta to 4.4 cm. There is
mild atherosclerosis of aorta, great vessels and coronary arteries.
Left subclavian Port-A-Cath extends to the mid SVC level. The heart
size is normal. There is no pericardial effusion.

Mediastinum/Nodes: There are no enlarged mediastinal, hilar or
axillary lymph nodes. The thyroid gland, esophagus and trachea
appear unremarkable.

Lungs/Pleura: No pleural effusion or pneumothorax. The lungs are
clear.

Musculoskeletal: No chest wall mass or suspicious osseous findings.
Asymmetric glenohumeral degenerative changes on the left.

CT ABDOMEN FINDINGS

Hepatobiliary: The liver appears unremarkable. No evidence of
gallstones, gallbladder wall thickening or biliary dilatation.

Pancreas: Unremarkable. No evidence of pancreatic mass, surrounding
inflammation or ductal dilatation.

Spleen: Normal in size without focal abnormality.

Adrenals/Urinary Tract: Both adrenal glands appear normal. Both
kidneys and proximal ureters appear normal. The bladder is not
imaged.

Stomach/Bowel: No evidence of bowel wall thickening, significant
distention or surrounding inflammation. The appendix appears normal.

Vascular/Lymphatic: No enlarged abdominal lymph nodes. Minimal
aortic and branch vessel atherosclerosis.

Other: No ascites or peritoneal nodularity.

Musculoskeletal: No abdominal wall mass or suspicious osseous
findings.
IMPRESSION: 1. Stable CTs of the chest and abdomen. No evidence of local
recurrence or metastatic disease.
2. Stable mild dilatation of the ascending aorta and aortic
atherosclerosis.

## 2018-12-15 ENCOUNTER — Ambulatory Visit
Admission: RE | Admit: 2018-12-15 | Discharge: 2018-12-15 | Disposition: A | Discharge status: Home / Self Care | Payer: BLUE CROSS/BLUE SHIELD | Source: Ambulatory Visit | Attending: Internal Medicine | Admitting: Internal Medicine

## 2018-12-15 DIAGNOSIS — C3492 Malignant neoplasm of unspecified part of left bronchus or lung: Secondary | ICD-10-CM | POA: Diagnosis not present

## 2018-12-15 DIAGNOSIS — C3402 Malignant neoplasm of left main bronchus: Secondary | ICD-10-CM | POA: Diagnosis not present

## 2018-12-15 DIAGNOSIS — Z5111 Encounter for antineoplastic chemotherapy: Secondary | ICD-10-CM | POA: Diagnosis not present

## 2018-12-15 MED ORDER — IOPAMIDOL (ISOVUE-300) INJECTION 61%
85.0000 mL | Freq: Once | INTRAVENOUS | Status: AC | PRN
  Administered 2018-12-15: 85 mL via INTRAVENOUS

## 2018-12-19 ENCOUNTER — Encounter: Payer: Self-pay | Admitting: *Deleted

## 2018-12-19 ENCOUNTER — Encounter: Payer: Self-pay | Admitting: Internal Medicine

## 2018-12-19 ENCOUNTER — Other Ambulatory Visit: Payer: Self-pay

## 2018-12-19 ENCOUNTER — Inpatient Hospital Stay (HOSPITAL_BASED_OUTPATIENT_CLINIC_OR_DEPARTMENT_OTHER): Payer: BLUE CROSS/BLUE SHIELD | Admitting: Internal Medicine

## 2018-12-19 ENCOUNTER — Inpatient Hospital Stay: Payer: BLUE CROSS/BLUE SHIELD | Attending: Internal Medicine

## 2018-12-19 ENCOUNTER — Inpatient Hospital Stay: Payer: BLUE CROSS/BLUE SHIELD

## 2018-12-19 VITALS — BP 133/87 | HR 88 | Temp 97.9°F | Ht 70.0 in | Wt 212.0 lb

## 2018-12-19 DIAGNOSIS — Z006 Encounter for examination for normal comparison and control in clinical research program: Secondary | ICD-10-CM | POA: Diagnosis not present

## 2018-12-19 DIAGNOSIS — C3402 Malignant neoplasm of left main bronchus: Secondary | ICD-10-CM | POA: Diagnosis not present

## 2018-12-19 DIAGNOSIS — I712 Thoracic aortic aneurysm, without rupture: Secondary | ICD-10-CM | POA: Insufficient documentation

## 2018-12-19 DIAGNOSIS — Z791 Long term (current) use of non-steroidal anti-inflammatories (NSAID): Secondary | ICD-10-CM

## 2018-12-19 DIAGNOSIS — M2550 Pain in unspecified joint: Secondary | ICD-10-CM | POA: Diagnosis not present

## 2018-12-19 DIAGNOSIS — Z79899 Other long term (current) drug therapy: Secondary | ICD-10-CM | POA: Diagnosis not present

## 2018-12-19 DIAGNOSIS — C3490 Malignant neoplasm of unspecified part of unspecified bronchus or lung: Secondary | ICD-10-CM

## 2018-12-19 DIAGNOSIS — G8929 Other chronic pain: Secondary | ICD-10-CM | POA: Diagnosis not present

## 2018-12-19 DIAGNOSIS — M7989 Other specified soft tissue disorders: Secondary | ICD-10-CM | POA: Insufficient documentation

## 2018-12-19 LAB — CBC WITH DIFFERENTIAL/PLATELET
Abs Immature Granulocytes: 0.03 10*3/uL (ref 0.00–0.07)
Basophils Absolute: 0 10*3/uL (ref 0.0–0.1)
Basophils Relative: 1 %
Eosinophils Absolute: 0.2 10*3/uL (ref 0.0–0.5)
Eosinophils Relative: 4 %
HCT: 41.9 % (ref 39.0–52.0)
Hemoglobin: 14.6 g/dL (ref 13.0–17.0)
Immature Granulocytes: 1 %
Lymphocytes Relative: 29 %
Lymphs Abs: 1.4 10*3/uL (ref 0.7–4.0)
MCH: 31.9 pg (ref 26.0–34.0)
MCHC: 34.8 g/dL (ref 30.0–36.0)
MCV: 91.7 fL (ref 80.0–100.0)
Monocytes Absolute: 0.5 10*3/uL (ref 0.1–1.0)
Monocytes Relative: 11 %
Neutro Abs: 2.7 10*3/uL (ref 1.7–7.7)
Neutrophils Relative %: 54 %
Platelets: 171 10*3/uL (ref 150–400)
RBC: 4.57 MIL/uL (ref 4.22–5.81)
RDW: 12.9 % (ref 11.5–15.5)
WBC: 4.9 10*3/uL (ref 4.0–10.5)
nRBC: 0 % (ref 0.0–0.2)

## 2018-12-19 LAB — COMPREHENSIVE METABOLIC PANEL
ALT: 19 U/L (ref 0–44)
AST: 23 U/L (ref 15–41)
Albumin: 4.4 g/dL (ref 3.5–5.0)
Alkaline Phosphatase: 42 U/L (ref 38–126)
Anion gap: 8 (ref 5–15)
BUN: 20 mg/dL (ref 6–20)
CO2: 27 mmol/L (ref 22–32)
Calcium: 9.2 mg/dL (ref 8.9–10.3)
Chloride: 103 mmol/L (ref 98–111)
Creatinine, Ser: 1.09 mg/dL (ref 0.61–1.24)
GFR calc Af Amer: >60 mL/min (ref >60)
GFR calc non Af Amer: >60 mL/min (ref >60)
Glucose, Bld: 153 mg/dL — ABNORMAL HIGH (ref 70–99)
Potassium: 4.2 mmol/L (ref 3.5–5.1)
Sodium: 138 mmol/L (ref 135–145)
Total Bilirubin: 0.8 mg/dL (ref 0.3–1.2)
Total Protein: 7.8 g/dL (ref 6.5–8.1)

## 2018-12-19 MED ORDER — SODIUM CHLORIDE 0.9 % IV SOLN
Freq: Once | INTRAVENOUS | Status: AC
  Administered 2018-12-19: 14:00:00 via INTRAVENOUS
  Filled 2018-12-19: qty 250

## 2018-12-19 MED ORDER — HEPARIN SOD (PORK) LOCK FLUSH 100 UNIT/ML IV SOLN
500.0000 [IU] | Freq: Once | INTRAVENOUS | Status: AC | PRN
  Administered 2018-12-19: 500 [IU]
  Filled 2018-12-19: qty 5

## 2018-12-19 MED ORDER — SODIUM CHLORIDE 0.9 % IV SOLN
Freq: Once | INTRAVENOUS | Status: AC
  Administered 2018-12-19: 14:00:00 via INTRAVENOUS
  Filled 2018-12-19: qty 4

## 2018-12-19 MED ORDER — SODIUM CHLORIDE 0.9 % IV SOLN
500.0000 mg/m2 | Freq: Once | INTRAVENOUS | Status: AC
  Administered 2018-12-19: 1050 mg via INTRAVENOUS
  Filled 2018-12-19: qty 42

## 2018-12-19 NOTE — Assessment & Plan Note (Addendum)
#   Adenocarcinoma the lung; stage IV; jan 2020-CT scan chest/a/p-shows no evidence of metastatic disease.  # Proceed with Alimta; Labs today reviewed;  acceptable for treatment today.  # Bilateral lower extremity swelling-intermittent-stable continue Lasix.  # Chronic joint pains-NSAIDs as needed.  Stable  #Thoracic aneurysm-stable; continue surveillance on subsequent imaging.  # DISPOSITION:  #  Treatment-today # follow up in 3 weeks-cbc/cmp-Alimta;-Dr.B  # I reviewed the blood work- with the patient in detail; also reviewed the imaging independently [as summarized above]; and with the patient in detail.

## 2018-12-19 NOTE — Progress Notes (Signed)
[place] OFFICE PROGRESS NOTE  Patient Care Team: [name], MD as PCP - General [facility])  Cancer Staging No matching staging information was found for the patient.   Oncology History   # 2012- METASTATIC ADENO CA of LEFT LUNG [acinar pattern] s/p MED LN Bx [NEG- EGFR/K-ras/ALK mutation];[name]; [name]; CT JULY 2016- NED; JAN 2018- NED  # Thoracic Aneurysm [4cm- stable]  ----------------------------------------------------------    DIAGNOSIS: [2012 ] Adenocarcinoma lung  STAGE: 4       ;GOALS: Palliative  CURRENT/MOST RECENT THERAPY _0  Alimta maintenance      Cancer of hilus of left lung ([place])   07/20/2016 Initial Diagnosis    Cancer of hilus of left lung ([facility])       INTERVAL HISTORY:  Mitchell Mosley 58 y.o.  male pleasant patient above history of metastatic adenocarcinoma the lung currently on maintenance Alimta is here for follow-up/review results of recent CAT scan.  Patient denies any fevers or chills.  He continues to be physically active.  No new shortness of breath or cough.  Intermittent bilateral leg swelling.  Mild joint pains.   Review of Systems  Constitutional: Negative for chills, diaphoresis, fever, malaise/fatigue and weight loss.  HENT: Negative for nosebleeds and sore throat.   Eyes: Negative for double vision.  Respiratory: Negative for hemoptysis, shortness of breath and wheezing.   Cardiovascular: Positive for leg swelling. Negative for chest pain, palpitations and orthopnea.  Gastrointestinal: Negative for abdominal pain, blood in stool, constipation, diarrhea, heartburn, melena, nausea and vomiting.  Genitourinary: Negative for dysuria, frequency and urgency.  Musculoskeletal: Positive for joint pain.  Skin: Negative.  Negative for itching and rash.  Neurological: Negative for dizziness, tingling, focal  weakness, weakness and headaches.  Endo/Heme/Allergies: Does not bruise/bleed easily.  Psychiatric/Behavioral: Negative for depression. The patient is not nervous/anxious and does not have insomnia.       PAST MEDICAL HISTORY :  Past Medical History:  Diagnosis Date  . Allergy   . Lung cancer ([place])   . Mild valvular heart disease Nov. 2014   per 2 D Echocardiogram done for persistent leg edema, monitored by cardiology  . Peripheral edema     PAST SURGICAL HISTORY :   Past Surgical History:  Procedure Laterality Date  . ELBOW SURGERY    . Left anterior thoracotomy with biopsy  March 2012  . PORTACATH PLACEMENT  March 2012  . TONSILLECTOMY      FAMILY HISTORY :   Family History  Problem Relation Age of Onset  . Leukemia Father   . Diabetes Mother     SOCIAL HISTORY:   Social History   Tobacco Use  . Smoking status: Never Smoker  . Smokeless tobacco: Never Used  Substance Use Topics  . Alcohol use: Yes    Alcohol/week: 0.0 standard drinks    Comment: "social drinker"   . Drug use: No    ALLERGIES:  is allergic to penicillins.  MEDICATIONS:  Current Outpatient Medications  Medication Sig Dispense Refill  . folic acid (FOLVITE) 1 MG tablet TAKE 1 TABLET (1 MG TOTAL) BY MOUTH DAILY. 30 tablet 11  . furosemide (LASIX) 20 MG tablet TAKE 1 TABLET (20 MG TOTAL) BY MOUTH DAILY. ONE TABLET DAILY AS NEEDED FOR SWELLING 90 tablet 1  . loratadine (CLARITIN) 10 MG tablet Take 1 tablet (10 mg total) by mouth daily.    . meloxicam (  MOBIC) 7.5 MG tablet Take 7.5 mg by mouth daily.     . Multiple Vitamin (MULTIVITAMIN) capsule Take 1 capsule by mouth daily. Reported on 02/24/2016    . Omega-3 Fatty Acids (FISH OIL) 1000 MG CAPS Take 1 capsule by mouth daily. Reported on 02/24/2016    . senna (SENOKOT) 8.6 MG tablet Take 1 tablet by mouth daily. Take as needed around time of chemotherapy    . spironolactone (ALDACTONE) 25 MG tablet TAKE 1 TABLET BY MOUTH EVERY DAY 90 tablet 3  .  tadalafil (ADCIRCA/CIALIS) 20 MG tablet TAKE 1 TABLET BY MOUTH EVERY 2 DAYS AS NEEDED. 6 tablet 11  . vitamin B-12 (CYANOCOBALAMIN) 1000 MCG tablet Take 1,000 mcg by mouth daily.     No current facility-administered medications for this visit.    Facility-Administered Medications Ordered in Other Visits  Medication Dose Route Frequency Provider Last Rate Last Dose  . sodium chloride 0.9 % injection 10 mL  10 mL Intracatheter PRN [name], MD   10 mL at 05/27/15 1543  . sodium chloride 0.9 % injection 10 mL  10 mL Intracatheter PRN [name], MD   10 mL at 06/17/15 1510  . sodium chloride 0.9 % injection 10 mL  10 mL Intravenous PRN [name], MD   10 mL at 07/08/15 1340    PHYSICAL EXAMINATION: ECOG PERFORMANCE STATUS: 0 - Asymptomatic  BP 133/87 (BP Location: Left Arm, Patient Position: Sitting)   Pulse 88   Temp 97.9 F (36.6 C)   Ht _0  (1.778 m)   Wt 212 lb (96.2 kg)   BMI 30.42 kg/m   Filed Weights   12/19/18 1506  Weight: 212 lb (96.2 kg)    Physical Exam  Constitutional: He is oriented to person, place, and time and well-developed, well-nourished, and in no distress.  HENT:  Head: Normocephalic and atraumatic.  Mouth/Throat: Oropharynx is clear and moist. No oropharyngeal exudate.  Eyes: Pupils are equal, round, and reactive to light.  Neck: Normal range of motion. Neck supple.  Cardiovascular: Normal rate and regular rhythm.  Pulmonary/Chest: No respiratory distress. He has no wheezes.  Abdominal: Soft. Bowel sounds are normal. He exhibits no distension and no mass. There is no abdominal tenderness. There is no rebound and no guarding.  Musculoskeletal: Normal range of motion.        General: Edema present. No tenderness.     Comments: Grade 1 swelling in the legs bilaterally.  Neurological: He is alert and oriented to person, place, and time.  Skin: Skin is warm.  Psychiatric: Affect normal.       LABORATORY DATA:  I have reviewed the  data as listed    Component Value Date/Time   NA 138 12/19/2018 1257   NA 138 12/10/2014 1341   K 4.2 12/19/2018 1257   K 3.6 12/10/2014 1341   CL 103 12/19/2018 1257   CL 102 12/10/2014 1341   CO2 27 12/19/2018 1257   CO2 30 12/10/2014 1341   GLUCOSE 153 (H) 12/19/2018 1257   GLUCOSE 160 (H) 12/10/2014 1341   BUN 20 12/19/2018 1257   BUN 21 (H) 12/10/2014 1341   CREATININE 1.09 12/19/2018 1257   CREATININE 1.10 03/25/2015 1453   CALCIUM 9.2 12/19/2018 1257   CALCIUM 8.4 (L) 12/10/2014 1341   PROT 7.8 12/19/2018 1257   PROT 6.6 12/10/2014 1341   ALBUMIN 4.4 12/19/2018 1257   ALBUMIN 3.4 12/10/2014 1341   AST 23 12/19/2018 1257   AST 17 12/10/2014  1341   ALT 19 12/19/2018 1257   ALT 23 12/10/2014 1341   ALKPHOS 42 12/19/2018 1257   ALKPHOS 50 12/10/2014 1341   BILITOT 0.8 12/19/2018 1257   BILITOT 0.5 12/10/2014 1341   GFRNONAA >60 12/19/2018 1257   GFRNONAA >60 03/25/2015 1453   GFRAA >60 12/19/2018 1257   GFRAA >60 03/25/2015 1453    No results found for: SPEP, UPEP  Lab Results  Component Value Date   WBC 4.9 12/19/2018   NEUTROABS 2.7 12/19/2018   HGB 14.6 12/19/2018   HCT 41.9 12/19/2018   MCV 91.7 12/19/2018   PLT 171 12/19/2018      Chemistry      Component Value Date/Time   NA 138 12/19/2018 1257   NA 138 12/10/2014 1341   K 4.2 12/19/2018 1257   K 3.6 12/10/2014 1341   CL 103 12/19/2018 1257   CL 102 12/10/2014 1341   CO2 27 12/19/2018 1257   CO2 30 12/10/2014 1341   BUN 20 12/19/2018 1257   BUN 21 (H) 12/10/2014 1341   CREATININE 1.09 12/19/2018 1257   CREATININE 1.10 03/25/2015 1453      Component Value Date/Time   CALCIUM 9.2 12/19/2018 1257   CALCIUM 8.4 (L) 12/10/2014 1341   ALKPHOS 42 12/19/2018 1257   ALKPHOS 50 12/10/2014 1341   AST 23 12/19/2018 1257   AST 17 12/10/2014 1341   ALT 19 12/19/2018 1257   ALT 23 12/10/2014 1341   BILITOT 0.8 12/19/2018 1257   BILITOT 0.5 12/10/2014 1341       RADIOGRAPHIC STUDIES: I have  personally reviewed the radiological images as listed and agreed with the findings in the report. No results found.   ASSESSMENT & PLAN:  Cancer of hilus of left lung ([place]) # Adenocarcinoma the lung; stage IV; jan 2020-CT scan chest/a/p-shows no evidence of metastatic disease.  # Proceed with Alimta; Labs today reviewed;  acceptable for treatment today.  # Bilateral lower extremity swelling-intermittent-stable continue Lasix.  # Chronic joint pains-NSAIDs as needed.  Stable  #Thoracic aneurysm-stable; continue surveillance on subsequent imaging.  # DISPOSITION:  #  Treatment-today # follow up in 3 weeks-cbc/cmp-Alimta;-Dr.B  # I reviewed the blood work- with the patient in detail; also reviewed the imaging independently [as summarized above]; and with the patient in detail.     No orders of the defined types were placed in this encounter.  All questions were answered. The patient knows to call the clinic with any problems, questions or concerns.      [name], MD 12/19/2018 7:22 PM

## 2018-12-19 NOTE — Progress Notes (Signed)
Mr. Yam returns to clinic today for consideration of cycle [phone] Alimta infusion on the [employer] research study. Continues to deny any new adverse events since last in the clinic. Per patient's norm, he continues to experience mild (1+) edema in his BLE, mostly in his ankles, and reports he is still taking both Lasix and Spironolactone daily for the edema. He also continues to experience the odd itching in his palms, the bottoms of his feet sporadically and in his right upper inner arm, which is relieved with use of generic skin lotion. Reports mild fatigue that begins shortly after infusion, which lasts 3-4 days, then he gradually returns to normal energy level. Patient's VS remain stable today. His weight is unchanged at 112 lbs (96.2kg) today. Mr. Evinger also verifies that he is taking Folic Acid and Mobic daily as prescribed. Patient's glucose level is elevated again today at 153mg /dL non-fasting; which is still elevated at Grade 1. Platelets remain wnl at 171,000K/uL. Sodium level is normal today at 13mmol/L, and serum creatinine is also wnl at 1.09mg /dL. CBC and chemistry levels are otherwise within acceptable parameters for patient to receive his Alimta infusion and Dr. Rogue Bussing has examined patient and agrees that we can proceed with Alimta today. Patient will receive a vitamin B12 injection in 3 weeks and had his CT scan on 12/15/2018. Dr. Rogue Bussing reviewed CT results with patient, which still shows no disease/CR. He will return to clinic in 3 weeks on 01/09/2019 for continued maintenance Alimta on the S130 protocol. Current and ongoing adverse events with grade and attribution are as follows:   Adverse Event Log Study/Protocol: [name] S130 Cycle: 595 Event Grade Onset Date Resolved Date Drug Name Pemetrexed Attribution Treatment Comments  Hyponatremia Grade 1 10/17/18 11/07/18  Unlikely  Sodium now 138  Cough Grade 1 09/19/18 10/17/18  Unlikely    Nasal congestion Grade 1 09/19/18  10/17/18  Unlikely    Elevated serum creatinine Grade 1 10/17/18 06/16/18 07/25/18 11/07/18 07/04/18 09/05/18  Possible  Increase fluids Serum creatinine is 1.1mg /dL today  Edema (mild) Grade 1  12/06/2017   Possible  Trace in right and 1+ edema in left ankle  Fatigue Grade1  01/14/16   Possible    Tendonitis Grade 1  09/21/16   Unrelated  Meloxicam  Hypocalcemia Grade 1 05/26/18 06/16/18  Possible  Resolved, Calcium level WNL  Hyperglycemia Grade 1 05/26/18 11/09/15  09/08/16  Unlikely  Non-fasting glucose was 107mg /dL  Thrombocytopenia Grade 1 09/26/18 07/04/18 05/26/18 12/06/17 08/02/17 11/27/18 07/25/18 06/16/18 08/02/17  08/23/17  Possible  Platelet count 174,000 today  Nausea Grade 1 11/16/17 11/17/17  Unrelated  R/T antibiotic  Abrasions on Right knuckles Grade 1 02/03/18 02/28/18  Unrelated  r/t Dog walking accident  Pruritis Grade 1  04/19/17   Possible Uses generic lotion with relief deep itching under the skin of palms, feet & right upper arm   Elevated cholesterol Grade 1 03/15/14   Unrelated  No ongoing monitoring available  Valvular Heart Disease Grade 1 10/19/13   Unrelated    Erectile Dysfunction Grade 2 12/11/15   Possible Takes Cialis   Skin hyperpigmentation Grade 1 07/08/15   Unrelated  Medial aspect of feel bil  Aneurysm of Ascending Aorta Grade 1 10/14/13   Unrelated  mentioned on CT scans  Possible arthritis of Left Knee Grade 1 05/06/15   Unrelated    Elevated BUN Grade 1 07/25/18 08/18/18  Possible    Yolande Jolly, BSN, MHA, OCN 12/19/2018 2:08 PM

## 2018-12-19 NOTE — Progress Notes (Signed)
Patienlast appointment.t here for treatment. No changes since last appointment.

## 2019-01-09 ENCOUNTER — Encounter: Payer: Self-pay | Admitting: *Deleted

## 2019-01-09 ENCOUNTER — Inpatient Hospital Stay: Payer: BLUE CROSS/BLUE SHIELD | Attending: Internal Medicine

## 2019-01-09 ENCOUNTER — Other Ambulatory Visit: Payer: Self-pay

## 2019-01-09 ENCOUNTER — Encounter: Payer: Self-pay | Admitting: Internal Medicine

## 2019-01-09 ENCOUNTER — Inpatient Hospital Stay (HOSPITAL_BASED_OUTPATIENT_CLINIC_OR_DEPARTMENT_OTHER): Payer: BLUE CROSS/BLUE SHIELD | Admitting: Internal Medicine

## 2019-01-09 ENCOUNTER — Inpatient Hospital Stay: Payer: BLUE CROSS/BLUE SHIELD

## 2019-01-09 VITALS — BP 118/79 | HR 81 | Temp 97.3°F | Resp 20 | Ht 70.0 in | Wt 212.0 lb

## 2019-01-09 DIAGNOSIS — C3402 Malignant neoplasm of left main bronchus: Secondary | ICD-10-CM

## 2019-01-09 DIAGNOSIS — G8929 Other chronic pain: Secondary | ICD-10-CM | POA: Diagnosis not present

## 2019-01-09 DIAGNOSIS — Z006 Encounter for examination for normal comparison and control in clinical research program: Secondary | ICD-10-CM | POA: Insufficient documentation

## 2019-01-09 DIAGNOSIS — Z5111 Encounter for antineoplastic chemotherapy: Secondary | ICD-10-CM | POA: Insufficient documentation

## 2019-01-09 DIAGNOSIS — C7802 Secondary malignant neoplasm of left lung: Secondary | ICD-10-CM | POA: Diagnosis not present

## 2019-01-09 DIAGNOSIS — C3490 Malignant neoplasm of unspecified part of unspecified bronchus or lung: Secondary | ICD-10-CM

## 2019-01-09 DIAGNOSIS — M2550 Pain in unspecified joint: Secondary | ICD-10-CM | POA: Diagnosis not present

## 2019-01-09 DIAGNOSIS — M7989 Other specified soft tissue disorders: Secondary | ICD-10-CM | POA: Diagnosis not present

## 2019-01-09 LAB — COMPREHENSIVE METABOLIC PANEL
ALT: 16 U/L (ref 0–44)
AST: 22 U/L (ref 15–41)
Albumin: 4 g/dL (ref 3.5–5.0)
Alkaline Phosphatase: 44 U/L (ref 38–126)
Anion gap: 5 (ref 5–15)
BUN: 15 mg/dL (ref 6–20)
CO2: 30 mmol/L (ref 22–32)
Calcium: 8.6 mg/dL — ABNORMAL LOW (ref 8.9–10.3)
Chloride: 101 mmol/L (ref 98–111)
Creatinine, Ser: 0.97 mg/dL (ref 0.61–1.24)
GFR calc Af Amer: >60 mL/min (ref >60)
GFR calc non Af Amer: >60 mL/min (ref >60)
Glucose, Bld: 156 mg/dL — ABNORMAL HIGH (ref 70–99)
Potassium: 3.6 mmol/L (ref 3.5–5.1)
Sodium: 136 mmol/L (ref 135–145)
Total Bilirubin: 0.7 mg/dL (ref 0.3–1.2)
Total Protein: 6.9 g/dL (ref 6.5–8.1)

## 2019-01-09 LAB — CBC WITH DIFFERENTIAL/PLATELET
Abs Immature Granulocytes: 0.02 10*3/uL (ref 0.00–0.07)
Basophils Absolute: 0.1 10*3/uL (ref 0.0–0.1)
Basophils Relative: 1 %
Eosinophils Absolute: 0.2 10*3/uL (ref 0.0–0.5)
Eosinophils Relative: 3 %
HCT: 40.4 % (ref 39.0–52.0)
Hemoglobin: 13.6 g/dL (ref 13.0–17.0)
Immature Granulocytes: 0 %
Lymphocytes Relative: 30 %
Lymphs Abs: 1.6 10*3/uL (ref 0.7–4.0)
MCH: 31.4 pg (ref 26.0–34.0)
MCHC: 33.7 g/dL (ref 30.0–36.0)
MCV: 93.3 fL (ref 80.0–100.0)
Monocytes Absolute: 0.5 10*3/uL (ref 0.1–1.0)
Monocytes Relative: 10 %
Neutro Abs: 2.9 10*3/uL (ref 1.7–7.7)
Neutrophils Relative %: 56 %
Platelets: 178 10*3/uL (ref 150–400)
RBC: 4.33 MIL/uL (ref 4.22–5.81)
RDW: 12.8 % (ref 11.5–15.5)
WBC: 5.2 10*3/uL (ref 4.0–10.5)
nRBC: 0 % (ref 0.0–0.2)

## 2019-01-09 MED ORDER — SODIUM CHLORIDE 0.9 % IV SOLN
Freq: Once | INTRAVENOUS | Status: AC
  Administered 2019-01-09: 15:00:00 via INTRAVENOUS
  Filled 2019-01-09: qty 250

## 2019-01-09 MED ORDER — SODIUM CHLORIDE 0.9 % IV SOLN
Freq: Once | INTRAVENOUS | Status: AC
  Administered 2019-01-09: 15:00:00 via INTRAVENOUS
  Filled 2019-01-09: qty 4

## 2019-01-09 MED ORDER — SODIUM CHLORIDE 0.9 % IV SOLN
500.0000 mg/m2 | Freq: Once | INTRAVENOUS | Status: AC
  Administered 2019-01-09: 1050 mg via INTRAVENOUS
  Filled 2019-01-09: qty 42

## 2019-01-09 MED ORDER — HEPARIN SOD (PORK) LOCK FLUSH 100 UNIT/ML IV SOLN
500.0000 [IU] | Freq: Once | INTRAVENOUS | Status: AC
  Administered 2019-01-09: 500 [IU] via INTRAVENOUS

## 2019-01-09 MED ORDER — SODIUM CHLORIDE 0.9% FLUSH
10.0000 mL | Freq: Once | INTRAVENOUS | Status: AC
  Administered 2019-01-09: 10 mL via INTRAVENOUS
  Filled 2019-01-09: qty 10

## 2019-01-09 MED ORDER — HEPARIN SOD (PORK) LOCK FLUSH 100 UNIT/ML IV SOLN
INTRAVENOUS | Status: AC
  Filled 2019-01-09: qty 5

## 2019-01-09 MED ORDER — CYANOCOBALAMIN 1000 MCG/ML IJ SOLN
1000.0000 ug | Freq: Once | INTRAMUSCULAR | Status: AC
  Administered 2019-01-09: 1000 ug via INTRAMUSCULAR
  Filled 2019-01-09: qty 1

## 2019-01-09 NOTE — Assessment & Plan Note (Addendum)
#   Adenocarcinoma the lung; stage IV; jan 2020-CT scan chest/a/p-shows no evidence of metastatic disease.  # Proceed with Alimta; Labs today reviewed;  acceptable for treatment today.  # Bilateral lower extremity swelling-intermittent-continue Lasix. STABLE.   # Chronic joint pains-NSAIDs as needed.stable.   # DISPOSITION:  #  Treatment/ b12-today # follow up in 3 weeks-cbc/cmp-Alimta;-Dr.B

## 2019-01-09 NOTE — Progress Notes (Signed)
[place] OFFICE PROGRESS NOTE  Patient Care Team: [name], MD as PCP - General [facility] Medicine)  Cancer Staging No matching staging information was found for the patient.   Oncology History   # 2012- METASTATIC ADENO CA of LEFT LUNG [acinar pattern] s/p MED LN Bx [NEG- EGFR/K-ras/ALK mutation];[name]; [name]; CT JULY 2016- NED; JAN 2018- NED  # Thoracic Aneurysm [4cm- stable]  ----------------------------------------------------------    DIAGNOSIS: [2012 ] Adenocarcinoma lung  STAGE: 4       ;GOALS: Palliative  CURRENT/MOST RECENT THERAPY '[ ]'$  Alimta maintenance      Cancer of hilus of left lung ([place])   07/20/2016 Initial Diagnosis    Cancer of hilus of left lung (HCC)       INTERVAL HISTORY:  Mitchell Mosley 58 y.o.  male pleasant patient above history of metastatic adenocarcinoma the lung currently on maintenance Alimta is here for follow-up.  Patient continues to deny any new shortness of breath or cough.  Appetite is good.  No weight loss.  Intermittent swelling in the legs.  Mild joint pains.   Review of Systems  Constitutional: Negative for chills, diaphoresis, fever, malaise/fatigue and weight loss.  HENT: Negative for nosebleeds and sore throat.   Eyes: Negative for double vision.  Respiratory: Negative for hemoptysis, shortness of breath and wheezing.   Cardiovascular: Positive for leg swelling. Negative for chest pain, palpitations and orthopnea.  Gastrointestinal: Negative for abdominal pain, blood in stool, constipation, diarrhea, heartburn, melena, nausea and vomiting.  Genitourinary: Negative for dysuria, frequency and urgency.  Musculoskeletal: Positive for joint pain.  Skin: Negative.  Negative for itching and rash.  Neurological: Negative for dizziness, tingling, focal weakness, weakness and headaches.   Endo/Heme/Allergies: Does not bruise/bleed easily.  Psychiatric/Behavioral: Negative for depression. The patient is not nervous/anxious and does not have insomnia.       PAST MEDICAL HISTORY :  Past Medical History:  Diagnosis Date  . Allergy   . Lung cancer ([place])   . Mild valvular heart disease Nov. 2014   per 2 D Echocardiogram done for persistent leg edema, monitored by cardiology  . Peripheral edema     PAST SURGICAL HISTORY :   Past Surgical History:  Procedure Laterality Date  . ELBOW SURGERY    . Left anterior thoracotomy with biopsy  March 2012  . PORTACATH PLACEMENT  March 2012  . TONSILLECTOMY      FAMILY HISTORY :   Family History  Problem Relation Age of Onset  . Leukemia Father   . Diabetes Mother     SOCIAL HISTORY:   Social History   Tobacco Use  . Smoking status: Never Smoker  . Smokeless tobacco: Never Used  Substance Use Topics  . Alcohol use: Yes    Alcohol/week: 0.0 standard drinks    Comment: "social drinker"   . Drug use: No    ALLERGIES:  is allergic to penicillins.  MEDICATIONS:  Current Outpatient Medications  Medication Sig Dispense Refill  . folic acid (FOLVITE) 1 MG tablet TAKE 1 TABLET (1 MG TOTAL) BY MOUTH DAILY. 30 tablet 11  . furosemide (LASIX) 20 MG tablet TAKE 1 TABLET (20 MG TOTAL) BY MOUTH DAILY. ONE TABLET DAILY AS NEEDED FOR SWELLING 90 tablet 1  . loratadine (CLARITIN) 10 MG tablet Take 1 tablet (10 mg total) by mouth daily.    . meloxicam (MOBIC) 7.5 MG tablet Take 7.5  mg by mouth daily.     . Multiple Vitamin (MULTIVITAMIN) capsule Take 1 capsule by mouth daily. Reported on 02/24/2016    . Omega-3 Fatty Acids (FISH OIL) 1000 MG CAPS Take 1 capsule by mouth daily. Reported on 02/24/2016    . senna (SENOKOT) 8.6 MG tablet Take 1 tablet by mouth daily. Take as needed around time of chemotherapy    . spironolactone (ALDACTONE) 25 MG tablet TAKE 1 TABLET BY MOUTH EVERY DAY 90 tablet 3  . tadalafil (ADCIRCA/CIALIS) 20 MG  tablet TAKE 1 TABLET BY MOUTH EVERY 2 DAYS AS NEEDED. 6 tablet 11  . vitamin B-12 (CYANOCOBALAMIN) 1000 MCG tablet Take 1,000 mcg by mouth daily.     No current facility-administered medications for this visit.    Facility-Administered Medications Ordered in Other Visits  Medication Dose Route Frequency Provider Last Rate Last Dose  . sodium chloride 0.9 % injection 10 mL  10 mL Intracatheter PRN [name], MD   10 mL at 05/27/15 1543  . sodium chloride 0.9 % injection 10 mL  10 mL Intracatheter PRN [name], MD   10 mL at 06/17/15 1510  . sodium chloride 0.9 % injection 10 mL  10 mL Intravenous PRN [name], MD   10 mL at 07/08/15 1340    PHYSICAL EXAMINATION: ECOG PERFORMANCE STATUS: 0 - Asymptomatic  BP 118/79 (BP Location: Left Arm, Patient Position: Sitting)   Pulse 81   Temp (!) 97.3 F (36.3 C) (Tympanic)   Resp 20   Ht _0  (1.778 m)   Wt 212 lb (96.2 kg)   BMI 30.42 kg/m   Filed Weights   01/09/19 1404  Weight: 212 lb (96.2 kg)    Physical Exam  Constitutional: He is oriented to person, place, and time and well-developed, well-nourished, and in no distress.  HENT:  Head: Normocephalic and atraumatic.  Mouth/Throat: Oropharynx is clear and moist. No oropharyngeal exudate.  Eyes: Pupils are equal, round, and reactive to light.  Neck: Normal range of motion. Neck supple.  Cardiovascular: Normal rate and regular rhythm.  Pulmonary/Chest: No respiratory distress. He has no wheezes.  Abdominal: Soft. Bowel sounds are normal. He exhibits no distension and no mass. There is no abdominal tenderness. There is no rebound and no guarding.  Musculoskeletal: Normal range of motion.        General: Edema present. No tenderness.     Comments: Grade 1 swelling in the legs bilaterally.  Neurological: He is alert and oriented to person, place, and time.  Skin: Skin is warm.  Psychiatric: Affect normal.       LABORATORY DATA:  I have reviewed the data as  listed    Component Value Date/Time   NA 136 01/09/2019 1342   NA 138 12/10/2014 1341   K 3.6 01/09/2019 1342   K 3.6 12/10/2014 1341   CL 101 01/09/2019 1342   CL 102 12/10/2014 1341   CO2 30 01/09/2019 1342   CO2 30 12/10/2014 1341   GLUCOSE 156 (H) 01/09/2019 1342   GLUCOSE 160 (H) 12/10/2014 1341   BUN 15 01/09/2019 1342   BUN 21 (H) 12/10/2014 1341   CREATININE 0.97 01/09/2019 1342   CREATININE 1.10 03/25/2015 1453   CALCIUM 8.6 (L) 01/09/2019 1342   CALCIUM 8.4 (L) 12/10/2014 1341   PROT 6.9 01/09/2019 1342   PROT 6.6 12/10/2014 1341   ALBUMIN 4.0 01/09/2019 1342   ALBUMIN 3.4 12/10/2014 1341   AST 22 01/09/2019 1342   AST 17  12/10/2014 1341   ALT 16 01/09/2019 1342   ALT 23 12/10/2014 1341   ALKPHOS 44 01/09/2019 1342   ALKPHOS 50 12/10/2014 1341   BILITOT 0.7 01/09/2019 1342   BILITOT 0.5 12/10/2014 1341   GFRNONAA >60 01/09/2019 1342   GFRNONAA >60 03/25/2015 1453   GFRAA >60 01/09/2019 1342   GFRAA >60 03/25/2015 1453    No results found for: SPEP, UPEP  Lab Results  Component Value Date   WBC 5.2 01/09/2019   NEUTROABS 2.9 01/09/2019   HGB 13.6 01/09/2019   HCT 40.4 01/09/2019   MCV 93.3 01/09/2019   PLT 178 01/09/2019      Chemistry      Component Value Date/Time   NA 136 01/09/2019 1342   NA 138 12/10/2014 1341   K 3.6 01/09/2019 1342   K 3.6 12/10/2014 1341   CL 101 01/09/2019 1342   CL 102 12/10/2014 1341   CO2 30 01/09/2019 1342   CO2 30 12/10/2014 1341   BUN 15 01/09/2019 1342   BUN 21 (H) 12/10/2014 1341   CREATININE 0.97 01/09/2019 1342   CREATININE 1.10 03/25/2015 1453      Component Value Date/Time   CALCIUM 8.6 (L) 01/09/2019 1342   CALCIUM 8.4 (L) 12/10/2014 1341   ALKPHOS 44 01/09/2019 1342   ALKPHOS 50 12/10/2014 1341   AST 22 01/09/2019 1342   AST 17 12/10/2014 1341   ALT 16 01/09/2019 1342   ALT 23 12/10/2014 1341   BILITOT 0.7 01/09/2019 1342   BILITOT 0.5 12/10/2014 1341       RADIOGRAPHIC STUDIES: I have  personally reviewed the radiological images as listed and agreed with the findings in the report. No results found.   ASSESSMENT & PLAN:  Cancer of hilus of left lung ([place]) # Adenocarcinoma the lung; stage IV; jan 2020-CT scan chest/a/p-shows no evidence of metastatic disease.  # Proceed with Alimta; Labs today reviewed;  acceptable for treatment today.  # Bilateral lower extremity swelling-intermittent-continue Lasix. STABLE.   # Chronic joint pains-NSAIDs as needed.stable.   # DISPOSITION:  #  Treatment/ b12-today # follow up in 3 weeks-cbc/cmp-Alimta;-Dr.B   No orders of the defined types were placed in this encounter.  All questions were answered. The patient knows to call the clinic with any problems, questions or concerns.      [name], MD 01/12/2019 1:21 PM

## 2019-01-09 NOTE — Progress Notes (Signed)
Mr. Schranz returns to clinic today for consideration of cycle #633 Alimta infusion on the [employer] research study. Patient denies any new adverse events since last in the clinic. Reports continued mild (1+) edema in his BLE, mostly in his ankles, and verifies he is still taking both Lasix and Spironolactone daily for the edema. He also continues to experience the odd itching in his palms, the bottoms of his feet sporadically and in his right upper inner arm, and states is relieved with use of generic skin lotion. Continues to report mild fatigue that begins shortly after infusion, lasts 3-4 days, then he gradually returns to normal energy level. Patient's VS remain stable today. His weight is unchanged at 112 lbs (96.2kg) today. Mr. Macomber also verifies that he is taking Folic Acid and Mobic daily as prescribed. Patient's glucose level is elevated again today at 156mg /dL non-fasting; which is still elevated at Grade 1. Platelets remain wnl at 178,000K/uL. Sodium level is normal today at 129mmol/L, and serum creatinine is also wnl at 0.97mg /dL. CBC and chemistry levels are otherwise within acceptable parameters for patient to receive his Alimta infusion and Dr. Rogue Bussing has examined patient and agrees that we can proceed with Alimta today. Patient will receive a vitamin B12 injection in clinic today as well. He will return to clinic in 3 weeks on 01/30/2019 for continued maintenance Alimta on the S130 protocol. Current and ongoing adverse events with grade and attribution are as follows:   Adverse Event Log Study/Protocol: [name] S130 Cycle: 632 Event Grade Onset Date Resolved Date Drug Name Pemetrexed Attribution Treatment Comments  Hyponatremia Grade 1 10/17/18 11/07/18  Unlikely  Sodium now 138  Cough Grade 1 09/19/18 10/17/18  Unlikely    Nasal congestion Grade 1 09/19/18 10/17/18  Unlikely    Elevated serum creatinine Grade 1 10/17/18 06/16/18 07/25/18 11/07/18 07/04/18 09/05/18  Possible   Increase fluids Serum creatinine is 1.1mg /dL today  Edema (mild) Grade 1  12/06/2017   Possible  Trace in right and 1+ edema in left ankle  Fatigue Grade1  01/14/16   Possible    Tendonitis Grade 1  09/21/16   Unrelated  Meloxicam  Hypocalcemia Grade 1 05/26/18 06/16/18  Possible  Resolved, Calcium level WNL  Hyperglycemia Grade 1 05/26/18 11/09/15  09/08/16  Unlikely  Non-fasting glucose was 156mg /dL  Thrombocytopenia Grade 1 09/26/18 07/04/18 05/26/18 12/06/17 08/02/17 11/27/18 07/25/18 06/16/18 08/02/17  08/23/17  Possible  Platelet count 178,000 today  Nausea Grade 1 11/16/17 11/17/17  Unrelated  R/T antibiotic  Abrasions on Right knuckles Grade 1 02/03/18 02/28/18  Unrelated  r/t Dog walking accident  Pruritis Grade 1  04/19/17   Possible Uses generic lotion with relief deep itching under the skin of palms, feet & right upper arm   Elevated cholesterol Grade 1 03/15/14   Unrelated  No ongoing monitoring available  Valvular Heart Disease Grade 1 10/19/13   Unrelated      Erectile Dysfunction Grade 2 12/11/15   Possible Takes Cialis   Skin hyperpigmentation Grade 1 07/08/15   Unrelated  Medial aspect of feel bil  Aneurysm of Ascending Aorta Grade 1 10/14/13   Unrelated  mentioned on CT scans  Possible arthritis of Left Knee Grade 1 05/06/15   Unrelated    Elevated BUN Grade 1 09/26/18 07/25/18 10/17/18  08/18/18  Possible    Yolande Jolly, BSN, MHA, OCN 01/09/2019  1:55 PM

## 2019-01-20 ENCOUNTER — Other Ambulatory Visit: Payer: Self-pay | Admitting: Internal Medicine

## 2019-01-30 ENCOUNTER — Inpatient Hospital Stay: Payer: BLUE CROSS/BLUE SHIELD

## 2019-01-30 ENCOUNTER — Inpatient Hospital Stay (HOSPITAL_BASED_OUTPATIENT_CLINIC_OR_DEPARTMENT_OTHER): Payer: BLUE CROSS/BLUE SHIELD | Admitting: Internal Medicine

## 2019-01-30 ENCOUNTER — Other Ambulatory Visit: Payer: Self-pay

## 2019-01-30 ENCOUNTER — Encounter: Payer: Self-pay | Admitting: *Deleted

## 2019-01-30 ENCOUNTER — Encounter: Payer: Self-pay | Admitting: Internal Medicine

## 2019-01-30 VITALS — BP 129/86 | HR 84 | Temp 97.9°F | Resp 18 | Ht 70.0 in | Wt 212.0 lb

## 2019-01-30 DIAGNOSIS — M7989 Other specified soft tissue disorders: Secondary | ICD-10-CM

## 2019-01-30 DIAGNOSIS — C3402 Malignant neoplasm of left main bronchus: Secondary | ICD-10-CM

## 2019-01-30 DIAGNOSIS — Z006 Encounter for examination for normal comparison and control in clinical research program: Secondary | ICD-10-CM

## 2019-01-30 DIAGNOSIS — G8929 Other chronic pain: Secondary | ICD-10-CM | POA: Diagnosis not present

## 2019-01-30 DIAGNOSIS — M2550 Pain in unspecified joint: Secondary | ICD-10-CM | POA: Diagnosis not present

## 2019-01-30 DIAGNOSIS — C7802 Secondary malignant neoplasm of left lung: Secondary | ICD-10-CM

## 2019-01-30 DIAGNOSIS — C3490 Malignant neoplasm of unspecified part of unspecified bronchus or lung: Secondary | ICD-10-CM

## 2019-01-30 DIAGNOSIS — Z5111 Encounter for antineoplastic chemotherapy: Secondary | ICD-10-CM | POA: Diagnosis not present

## 2019-01-30 LAB — CBC WITH DIFFERENTIAL/PLATELET
Abs Immature Granulocytes: 0.02 10*3/uL (ref 0.00–0.07)
Basophils Absolute: 0 10*3/uL (ref 0.0–0.1)
Basophils Relative: 1 %
Eosinophils Absolute: 0.1 10*3/uL (ref 0.0–0.5)
Eosinophils Relative: 2 %
HCT: 38.8 % — ABNORMAL LOW (ref 39.0–52.0)
Hemoglobin: 13.3 g/dL (ref 13.0–17.0)
Immature Granulocytes: 0 %
Lymphocytes Relative: 31 %
Lymphs Abs: 1.5 10*3/uL (ref 0.7–4.0)
MCH: 32.3 pg (ref 26.0–34.0)
MCHC: 34.3 g/dL (ref 30.0–36.0)
MCV: 94.2 fL (ref 80.0–100.0)
Monocytes Absolute: 0.5 10*3/uL (ref 0.1–1.0)
Monocytes Relative: 10 %
Neutro Abs: 2.7 10*3/uL (ref 1.7–7.7)
Neutrophils Relative %: 56 %
Platelets: 160 10*3/uL (ref 150–400)
RBC: 4.12 MIL/uL — ABNORMAL LOW (ref 4.22–5.81)
RDW: 13 % (ref 11.5–15.5)
WBC: 4.7 10*3/uL (ref 4.0–10.5)
nRBC: 0 % (ref 0.0–0.2)

## 2019-01-30 LAB — URINALYSIS, COMPLETE (UACMP) WITH MICROSCOPIC
Bacteria, UA: NONE SEEN
Bilirubin Urine: NEGATIVE
Glucose, UA: NEGATIVE mg/dL
Hgb urine dipstick: NEGATIVE
Ketones, ur: NEGATIVE mg/dL
Leukocytes,Ua: NEGATIVE
Nitrite: NEGATIVE
Protein, ur: NEGATIVE mg/dL
Specific Gravity, Urine: 1.008 (ref 1.005–1.030)
pH: 7 (ref 5.0–8.0)

## 2019-01-30 LAB — COMPREHENSIVE METABOLIC PANEL
ALT: 16 U/L (ref 0–44)
AST: 24 U/L (ref 15–41)
Albumin: 3.9 g/dL (ref 3.5–5.0)
Alkaline Phosphatase: 38 U/L (ref 38–126)
Anion gap: 7 (ref 5–15)
BUN: 15 mg/dL (ref 6–20)
CO2: 25 mmol/L (ref 22–32)
Calcium: 8.9 mg/dL (ref 8.9–10.3)
Chloride: 103 mmol/L (ref 98–111)
Creatinine, Ser: 1.03 mg/dL (ref 0.61–1.24)
GFR calc Af Amer: >60 mL/min (ref >60)
GFR calc non Af Amer: >60 mL/min (ref >60)
Glucose, Bld: 176 mg/dL — ABNORMAL HIGH (ref 70–99)
Potassium: 3.7 mmol/L (ref 3.5–5.1)
Sodium: 135 mmol/L (ref 135–145)
Total Bilirubin: 0.9 mg/dL (ref 0.3–1.2)
Total Protein: 6.9 g/dL (ref 6.5–8.1)

## 2019-01-30 LAB — MAGNESIUM: Magnesium: 2 mg/dL (ref 1.7–2.4)

## 2019-01-30 MED ORDER — SODIUM CHLORIDE 0.9 % IV SOLN
500.0000 mg/m2 | Freq: Once | INTRAVENOUS | Status: AC
  Administered 2019-01-30: 1050 mg via INTRAVENOUS
  Filled 2019-01-30: qty 42

## 2019-01-30 MED ORDER — SODIUM CHLORIDE 0.9 % IV SOLN
Freq: Once | INTRAVENOUS | Status: AC
  Administered 2019-01-30: 15:00:00 via INTRAVENOUS
  Filled 2019-01-30: qty 4

## 2019-01-30 MED ORDER — SODIUM CHLORIDE 0.9% FLUSH
10.0000 mL | INTRAVENOUS | Status: DC | PRN
  Administered 2019-01-30: 10 mL via INTRAVENOUS
  Filled 2019-01-30: qty 10

## 2019-01-30 MED ORDER — SODIUM CHLORIDE 0.9 % IV SOLN
Freq: Once | INTRAVENOUS | Status: AC
  Administered 2019-01-30: 15:00:00 via INTRAVENOUS
  Filled 2019-01-30: qty 250

## 2019-01-30 MED ORDER — HEPARIN SOD (PORK) LOCK FLUSH 100 UNIT/ML IV SOLN
500.0000 [IU] | Freq: Once | INTRAVENOUS | Status: AC
  Administered 2019-01-30: 500 [IU] via INTRAVENOUS
  Filled 2019-01-30: qty 5

## 2019-01-30 NOTE — Research (Addendum)
Mitchell Mosley returns to clinic today for consideration of cycle #634 Alimta infusion on the [employer] research study. Patient denies any new adverse events since last in the clinic. Reports continued mild (1+) edema in his BLE, mostly in his ankles, and verifies he is still taking both Lasix and Spironolactone daily for the edema. He reports no changes the odd itching in his palms, the bottoms of his feet sporadically and in his right upper inner arm, and states is relieved with use of generic skin lotion. Patient reports mild fatigue that begins the day after infusion and can last 3-4 days before he gradually returns to normal energy level. Patient's VS remain stable today. His weight remains unchanged at 112 lbs (96.2kg) today, and this was verified by Renita Papa, RN. Mitchell Mosley also verifies that he is taking Folic Acid and Mobic daily as prescribed. Patient's glucose level is elevated again today at 176mg /dL non-fasting; which is still elevated at Grade 1. Platelets remain wnl at 160,000K/uL. Sodium level is low normal today at 131mmol/L, and serum creatinine is also wnl at 1.03mg /dL. CBC and chemistry levels are otherwise within acceptable parameters for patient to receive his Alimta infusion and Dr. Rogue Bussing has examined patient and agrees that we can proceed with Alimta today. Patient had a urinalysis and magnesium level collected today as well. Magnesium is wnl and urine is negative for protein. He will return to clinic in 3 weeks on 02/20/2019 for continued maintenance Alimta on the S130 protocol. CT scan is due on 03/30/2019. Current and ongoing adverse events with grade and attribution are as follows:   Adverse Event Log Study/Protocol: [name] S130 Cycle: 633 Event Grade Onset Date Resolved Date Drug Name Pemetrexed Attribution Treatment Comments  Hyponatremia Grade 1 10/17/18 11/07/18  Unlikely  Sodium now 135  Cough Grade 1 09/19/18 10/17/18  Unlikely    Nasal congestion Grade 1  09/19/18 10/17/18  Unlikely    Elevated serum creatinine Grade 1 10/17/18 06/16/18 07/25/18 11/07/18 07/04/18 09/05/18  Possible  Increase fluids Serum creatinine is 1.03 mg/dL   Edema (mild) Grade 1  12/06/2017   Possible  Trace in right and 1+ edema in left ankle  Fatigue Grade1  01/14/16   Possible    Tendonitis Grade 1  09/21/16   Unrelated  Meloxicam  Hypocalcemia Grade 1 05/26/18 06/16/18  Possible  Resolved, Calcium level WNL  Hyperglycemia Grade 1 05/26/18 11/09/15  09/08/16  Unlikely  Non-fasting glucose was 176mg /dL  Thrombocytopenia Grade 1 09/26/18 07/04/18 05/26/18 12/06/17 08/02/17 11/27/18 07/25/18 06/16/18 08/02/17  08/23/17  Possible  Platelet count 160,000 today  Nausea Grade 1 11/16/17 11/17/17  Unrelated  R/T antibiotic  Abrasions on Right knuckles Grade 1 02/03/18 02/28/18  Unrelated  r/t Dog walking accident  Pruritis Grade 1  04/19/17   Possible Uses generic lotion with relief deep itching under the skin of palms, feet & right upper arm   Elevated cholesterol Grade 1 03/15/14   Unrelated  No ongoing monitoring available  Valvular Heart Disease Grade 1 10/19/13   Unrelated      Erectile Dysfunction  Grade 2 12/11/15   Possible Cialis   Skin hyperpigmentation Grade 1 07/08/15   Unrelated  Medial aspect of feel bil  Aneurysm of Ascending Aorta Grade 1 10/14/13   Unrelated  mentioned on CT scans  Possible arthritis of Left Knee Grade 1 05/06/15   Unrelated    Elevated BUN Grade 1 09/26/18 07/25/18 10/17/18   08/18/18  Possible  Yolande Jolly, BSN, MHA, OCN 01/30/2019 2:58 PM

## 2019-01-30 NOTE — Progress Notes (Signed)
[place] OFFICE PROGRESS NOTE  Patient Care Team: [name], MD as PCP - General [facility] Medicine)  Cancer Staging No matching staging information was found for the patient.   Oncology History   # 2012- METASTATIC ADENO CA of LEFT LUNG [[place]inar pattern] s/p MED LN Bx [NEG- EGFR/K-ras/ALK mutation];[name]; [name]; CT JULY 2016- NED; JAN 2018- NED  # Thoracic Aneurysm [4cm- stable]  ----------------------------------------------------------    DIAGNOSIS: [2012 ] Adenocarcinoma lung  STAGE: 4       ;GOALS: Palliative  CURRENT/MOST RECENT THERAPY '[ ]'  Alimta maintenance      Cancer of hilus of left lung ([place])   07/20/2016 Initial Diagnosis    Cancer of hilus of left lung (HCC)       INTERVAL HISTORY:  Mitchell Mosley 58 y.o.  male pleasant patient above history of metastatic adenocarcinoma the lung currently on maintenance Alimta is here for follow-up.  Patient continues to have intermittent swelling in the legs.  Continues to take Lasix as needed.  Otherwise can be physically active.  Mild joint pains.  No new shortness of breath or cough or headaches.   Review of Systems  Constitutional: Negative for chills, diaphoresis, fever, malaise/fatigue and weight loss.  HENT: Negative for nosebleeds and sore throat.   Eyes: Negative for double vision.  Respiratory: Negative for hemoptysis, shortness of breath and wheezing.   Cardiovascular: Positive for leg swelling. Negative for chest pain, palpitations and orthopnea.  Gastrointestinal: Negative for abdominal pain, blood in stool, constipation, diarrhea, heartburn, melena, nausea and vomiting.  Genitourinary: Negative for dysuria, frequency and urgency.  Musculoskeletal: Positive for joint pain.  Skin: Negative.  Negative for itching and rash.  Neurological: Negative for dizziness, tingling, focal  weakness, weakness and headaches.  Endo/Heme/Allergies: Does not bruise/bleed easily.  Psychiatric/Behavioral: Negative for depression. The patient is not nervous/anxious and does not have insomnia.       PAST MEDICAL HISTORY :  Past Medical History:  Diagnosis Date  . Allergy   . Lung cancer ([place])   . Mild valvular heart disease Nov. 2014   per 2 D Echocardiogram done for persistent leg edema, monitored by cardiology  . Peripheral edema     PAST SURGICAL HISTORY :   Past Surgical History:  Procedure Laterality Date  . ELBOW SURGERY    . Left anterior thoracotomy with biopsy  March 2012  . PORTACATH PLACEMENT  March 2012  . TONSILLECTOMY      FAMILY HISTORY :   Family History  Problem Relation Age of Onset  . Leukemia Father   . Diabetes Mother     SOCIAL HISTORY:   Social History   Tobacco Use  . Smoking status: Never Smoker  . Smokeless tobacco: Never Used  Substance Use Topics  . Alcohol use: Yes    Alcohol/week: 0.0 standard drinks    Comment: "social drinker"   . Drug use: No    ALLERGIES:  is allergic to penicillins.  MEDICATIONS:  Current Outpatient Medications  Medication Sig Dispense Refill  . folic acid (FOLVITE) 1 MG tablet TAKE 1 TABLET (1 MG TOTAL) BY MOUTH DAILY. 30 tablet 11  . furosemide (LASIX) 20 MG tablet TAKE 1 TABLET (20 MG TOTAL) BY MOUTH DAILY. ONE TABLET DAILY AS NEEDED FOR SWELLING 90 tablet 1  . loratadine (CLARITIN) 10 MG tablet Take 1 tablet (10 mg total) by mouth daily.    Marland Kitchen  meloxicam (MOBIC) 7.5 MG tablet Take 7.5 mg by mouth daily.     . Multiple Vitamin (MULTIVITAMIN) capsule Take 1 capsule by mouth daily. Reported on 02/24/2016    . Omega-3 Fatty Acids (FISH OIL) 1000 MG CAPS Take 1 capsule by mouth daily. Reported on 02/24/2016    . senna (SENOKOT) 8.6 MG tablet Take 1 tablet by mouth daily. Take as needed around time of chemotherapy    . spironolactone (ALDACTONE) 25 MG tablet TAKE 1 TABLET BY MOUTH EVERY DAY 90 tablet 3  .  tadalafil (ADCIRCA/CIALIS) 20 MG tablet TAKE 1 TABLET BY MOUTH EVERY 2 DAYS AS NEEDED. 6 tablet 11  . vitamin B-12 (CYANOCOBALAMIN) 1000 MCG tablet Take 1,000 mcg by mouth daily.     No current facility-administered medications for this visit.    Facility-Administered Medications Ordered in Other Visits  Medication Dose Route Frequency Provider Last Rate Last Dose  . sodium chloride 0.9 % injection 10 mL  10 mL Intracatheter PRN [name], MD   10 mL at 05/27/15 1543  . sodium chloride 0.9 % injection 10 mL  10 mL Intracatheter PRN [name], MD   10 mL at 06/17/15 1510  . sodium chloride 0.9 % injection 10 mL  10 mL Intravenous PRN Choksi, Delorise Shiner, MD   10 mL at 07/08/15 1340  . sodium chloride flush (NS) 0.9 % injection 10 mL  10 mL Intravenous PRN [name], MD   10 mL at 01/30/19 1345    PHYSICAL EXAMINATION: ECOG PERFORMANCE STATUS: 0 - Asymptomatic  BP 129/86   Pulse 84   Temp 97.9 F (36.6 C) (Tympanic)   Resp 18   Ht '5\' 10"'  (1.778 m)   Wt 212 lb (96.2 kg)   BMI 30.42 kg/m   Filed Weights   01/30/19 1409  Weight: 212 lb (96.2 kg)    Physical Exam  Constitutional: He is oriented to person, place, and time and well-developed, well-nourished, and in no distress.  HENT:  Head: Normocephalic and atraumatic.  Mouth/Throat: Oropharynx is clear and moist. No oropharyngeal exudate.  Eyes: Pupils are equal, round, and reactive to light.  Neck: Normal range of motion. Neck supple.  Cardiovascular: Normal rate and regular rhythm.  Pulmonary/Chest: No respiratory distress. He has no wheezes.  Abdominal: Soft. Bowel sounds are normal. He exhibits no distension and no mass. There is no abdominal tenderness. There is no rebound and no guarding.  Musculoskeletal: Normal range of motion.        General: Edema present. No tenderness.     Comments: Grade 1 swelling in the legs bilaterally.  Neurological: He is alert and oriented to person, place, and time.  Skin:  Skin is warm.  Psychiatric: Affect normal.       LABORATORY DATA:  I have reviewed the data as listed    Component Value Date/Time   NA 135 01/30/2019 1333   NA 138 12/10/2014 1341   K 3.7 01/30/2019 1333   K 3.6 12/10/2014 1341   CL 103 01/30/2019 1333   CL 102 12/10/2014 1341   CO2 25 01/30/2019 1333   CO2 30 12/10/2014 1341   GLUCOSE 176 (H) 01/30/2019 1333   GLUCOSE 160 (H) 12/10/2014 1341   BUN 15 01/30/2019 1333   BUN 21 (H) 12/10/2014 1341   CREATININE 1.03 01/30/2019 1333   CREATININE 1.10 03/25/2015 1453   CALCIUM 8.9 01/30/2019 1333   CALCIUM 8.4 (L) 12/10/2014 1341   PROT 6.9 01/30/2019 1333   PROT  6.6 12/10/2014 1341   ALBUMIN 3.9 01/30/2019 1333   ALBUMIN 3.4 12/10/2014 1341   AST 24 01/30/2019 1333   AST 17 12/10/2014 1341   ALT 16 01/30/2019 1333   ALT 23 12/10/2014 1341   ALKPHOS 38 01/30/2019 1333   ALKPHOS 50 12/10/2014 1341   BILITOT 0.9 01/30/2019 1333   BILITOT 0.5 12/10/2014 1341   GFRNONAA >60 01/30/2019 1333   GFRNONAA >60 03/25/2015 1453   GFRAA >60 01/30/2019 1333   GFRAA >60 03/25/2015 1453    No results found for: SPEP, UPEP  Lab Results  Component Value Date   WBC 4.7 01/30/2019   NEUTROABS 2.7 01/30/2019   HGB 13.3 01/30/2019   HCT 38.8 (L) 01/30/2019   MCV 94.2 01/30/2019   PLT 160 01/30/2019      Chemistry      Component Value Date/Time   NA 135 01/30/2019 1333   NA 138 12/10/2014 1341   K 3.7 01/30/2019 1333   K 3.6 12/10/2014 1341   CL 103 01/30/2019 1333   CL 102 12/10/2014 1341   CO2 25 01/30/2019 1333   CO2 30 12/10/2014 1341   BUN 15 01/30/2019 1333   BUN 21 (H) 12/10/2014 1341   CREATININE 1.03 01/30/2019 1333   CREATININE 1.10 03/25/2015 1453      Component Value Date/Time   CALCIUM 8.9 01/30/2019 1333   CALCIUM 8.4 (L) 12/10/2014 1341   ALKPHOS 38 01/30/2019 1333   ALKPHOS 50 12/10/2014 1341   AST 24 01/30/2019 1333   AST 17 12/10/2014 1341   ALT 16 01/30/2019 1333   ALT 23 12/10/2014 1341    BILITOT 0.9 01/30/2019 1333   BILITOT 0.5 12/10/2014 1341       RADIOGRAPHIC STUDIES: I have personally reviewed the radiological images as listed and agreed with the findings in the report. No results found.   ASSESSMENT & PLAN:  Cancer of hilus of left lung ([place]) # Adenocarcinoma the lung; stage IV; jan 2020-CT scan chest/a/p-shows no evidence of metastatic disease.STABLE.   # Proceed with Alimta; Labs today reviewed;  acceptable for treatment today.  Awaiting repeat scans in April 2020.  # Bilateral lower extremity swelling-intermittent-continue Lasix.  Stable.   # Chronic joint pains-NSAIDs as needed.STABLE.   # DISPOSITION:  #  Treatment today # follow up in 3 weeks-cbc/cmp-Alimta;-Dr.B   No orders of the defined types were placed in this encounter.  All questions were answered. The patient knows to call the clinic with any problems, questions or concerns.      [name], MD 01/30/2019 3:24 PM

## 2019-01-30 NOTE — Assessment & Plan Note (Addendum)
#   Adenocarcinoma the lung; stage IV; jan 2020-CT scan chest/a/p-shows no evidence of metastatic disease.STABLE.   # Proceed with Alimta; Labs today reviewed;  acceptable for treatment today.  Awaiting repeat scans in April 2020.  # Bilateral lower extremity swelling-intermittent-continue Lasix.  Stable.   # Chronic joint pains-NSAIDs as needed.STABLE.   # DISPOSITION:  #  Treatment today # follow up in 3 weeks-cbc/cmp-Alimta;-Dr.B

## 2019-02-19 ENCOUNTER — Other Ambulatory Visit: Payer: Self-pay

## 2019-02-20 ENCOUNTER — Encounter: Payer: Self-pay | Admitting: *Deleted

## 2019-02-20 ENCOUNTER — Inpatient Hospital Stay (HOSPITAL_BASED_OUTPATIENT_CLINIC_OR_DEPARTMENT_OTHER): Payer: BLUE CROSS/BLUE SHIELD | Admitting: Internal Medicine

## 2019-02-20 ENCOUNTER — Encounter: Payer: Self-pay | Admitting: Internal Medicine

## 2019-02-20 ENCOUNTER — Inpatient Hospital Stay: Payer: BLUE CROSS/BLUE SHIELD

## 2019-02-20 ENCOUNTER — Inpatient Hospital Stay: Payer: BLUE CROSS/BLUE SHIELD | Attending: Internal Medicine

## 2019-02-20 ENCOUNTER — Other Ambulatory Visit: Payer: Self-pay

## 2019-02-20 VITALS — BP 134/86 | HR 101 | Temp 97.9°F | Resp 20 | Ht 70.0 in | Wt 208.6 lb

## 2019-02-20 DIAGNOSIS — G8929 Other chronic pain: Secondary | ICD-10-CM | POA: Diagnosis not present

## 2019-02-20 DIAGNOSIS — Z006 Encounter for examination for normal comparison and control in clinical research program: Secondary | ICD-10-CM | POA: Diagnosis not present

## 2019-02-20 DIAGNOSIS — Z95828 Presence of other vascular implants and grafts: Secondary | ICD-10-CM

## 2019-02-20 DIAGNOSIS — M2550 Pain in unspecified joint: Secondary | ICD-10-CM | POA: Insufficient documentation

## 2019-02-20 DIAGNOSIS — C3402 Malignant neoplasm of left main bronchus: Secondary | ICD-10-CM | POA: Insufficient documentation

## 2019-02-20 DIAGNOSIS — M7989 Other specified soft tissue disorders: Secondary | ICD-10-CM | POA: Insufficient documentation

## 2019-02-20 LAB — CBC WITH DIFFERENTIAL/PLATELET
Abs Immature Granulocytes: 0.02 10*3/uL (ref 0.00–0.07)
BASOS PCT: 1 %
Basophils Absolute: 0 10*3/uL (ref 0.0–0.1)
Eosinophils Absolute: 0.1 10*3/uL (ref 0.0–0.5)
Eosinophils Relative: 2 %
HCT: 40.6 % (ref 39.0–52.0)
Hemoglobin: 14.2 g/dL (ref 13.0–17.0)
Immature Granulocytes: 0 %
Lymphocytes Relative: 23 %
Lymphs Abs: 1.3 10*3/uL (ref 0.7–4.0)
MCH: 32.8 pg (ref 26.0–34.0)
MCHC: 35 g/dL (ref 30.0–36.0)
MCV: 93.8 fL (ref 80.0–100.0)
MONOS PCT: 8 %
Monocytes Absolute: 0.5 10*3/uL (ref 0.1–1.0)
NEUTROS PCT: 66 %
Neutro Abs: 4 10*3/uL (ref 1.7–7.7)
Platelets: 168 10*3/uL (ref 150–400)
RBC: 4.33 MIL/uL (ref 4.22–5.81)
RDW: 12.8 % (ref 11.5–15.5)
WBC: 6 10*3/uL (ref 4.0–10.5)
nRBC: 0 % (ref 0.0–0.2)

## 2019-02-20 LAB — COMPREHENSIVE METABOLIC PANEL
ALK PHOS: 43 U/L (ref 38–126)
ALT: 18 U/L (ref 0–44)
AST: 23 U/L (ref 15–41)
Albumin: 4.3 g/dL (ref 3.5–5.0)
Anion gap: 10 (ref 5–15)
BILIRUBIN TOTAL: 0.9 mg/dL (ref 0.3–1.2)
BUN: 19 mg/dL (ref 6–20)
CO2: 23 mmol/L (ref 22–32)
Calcium: 8.9 mg/dL (ref 8.9–10.3)
Chloride: 99 mmol/L (ref 98–111)
Creatinine, Ser: 1.21 mg/dL (ref 0.61–1.24)
GFR calc Af Amer: >60 mL/min (ref >60)
GFR calc non Af Amer: >60 mL/min (ref >60)
Glucose, Bld: 198 mg/dL — ABNORMAL HIGH (ref 70–99)
Potassium: 3.7 mmol/L (ref 3.5–5.1)
Sodium: 132 mmol/L — ABNORMAL LOW (ref 135–145)
Total Protein: 7.6 g/dL (ref 6.5–8.1)

## 2019-02-20 MED ORDER — HEPARIN SOD (PORK) LOCK FLUSH 100 UNIT/ML IV SOLN
500.0000 [IU] | Freq: Once | INTRAVENOUS | Status: AC
  Administered 2019-02-20: 500 [IU]
  Filled 2019-02-20: qty 5

## 2019-02-20 MED ORDER — SODIUM CHLORIDE 0.9% FLUSH
10.0000 mL | Freq: Once | INTRAVENOUS | Status: AC
  Administered 2019-02-20: 10 mL via INTRAVENOUS
  Filled 2019-02-20: qty 10

## 2019-02-20 NOTE — Assessment & Plan Note (Addendum)
#   Adenocarcinoma the lung; stage IV; jan 2020-CT scan chest/a/p-shows no evidence of metastatic disease. Stable.   #Hold Alimta today labs today reviewed;-within normal limits.  Long discussion with the patient regarding the current covid pandemic/the risk of infection especially cancer diagnosis/chemotherapy.  Will order CT scan at next visit  # Bilateral lower extremity swelling-intermittent-continue Lasix.  Stable  # Chronic joint pains-NSAIDs as needed.stable.   # DISPOSITION:  #  HOLD Treatment today; de-access # follow up in 3 weeks [covid]-cbc/cmp-Alimta;-Dr.B

## 2019-02-20 NOTE — Progress Notes (Signed)
[place] OFFICE PROGRESS NOTE  Patient Care Team: [name], MD as PCP - General [facility])  Cancer Staging No matching staging information was found for the patient.   Oncology History   # 2012- METASTATIC ADENO CA of LEFT LUNG [acinar pattern] s/p MED LN Bx [NEG- EGFR/K-ras/ALK mutation];[name]; [name]; CT JULY 2016- NED; JAN 2018- NED  # Thoracic Aneurysm [4cm- stable]  ----------------------------------------------------------    DIAGNOSIS: [2012 ] Adenocarcinoma lung  STAGE: 4       ;GOALS: Palliative  CURRENT/MOST RECENT THERAPY '[ ]'  Alimta maintenance      Cancer of hilus of left lung ([place])   07/20/2016 Initial Diagnosis    Cancer of hilus of left lung (H[facility])       INTERVAL HISTORY:  Mitchell Mosley 58 y.o.  male pleasant patient above history of metastatic adenocarcinoma the lung currently on maintenance Alimta is here for follow-up.  Patient continues to have intermittent swelling in the legs.  Not any worse.  No nausea no vomiting.  No fever chills.   Review of Systems  Constitutional: Negative for chills, diaphoresis, fever, malaise/fatigue and weight loss.  HENT: Negative for nosebleeds and sore throat.   Eyes: Negative for double vision.  Respiratory: Negative for hemoptysis, shortness of breath and wheezing.   Cardiovascular: Positive for leg swelling. Negative for chest pain, palpitations and orthopnea.  Gastrointestinal: Negative for abdominal pain, blood in stool, constipation, diarrhea, heartburn, melena, nausea and vomiting.  Genitourinary: Negative for dysuria, frequency and urgency.  Musculoskeletal: Positive for joint pain.  Skin: Negative.  Negative for itching and rash.  Neurological: Negative for dizziness, tingling, focal weakness, weakness and headaches.  Endo/Heme/Allergies: Does not bruise/bleed  easily.  Psychiatric/Behavioral: Negative for depression. The patient is not nervous/anxious and does not have insomnia.       PAST MEDICAL HISTORY :  Past Medical History:  Diagnosis Date  . Allergy   . Lung cancer ([place])   . Mild valvular heart disease Nov. 2014   per 2 D Echocardiogram done for persistent leg edema, monitored by cardiology  . Peripheral edema     PAST SURGICAL HISTORY :   Past Surgical History:  Procedure Laterality Date  . ELBOW SURGERY    . Left anterior thoracotomy with biopsy  March 2012  . PORTACATH PLACEMENT  March 2012  . TONSILLECTOMY      FAMILY HISTORY :   Family History  Problem Relation Age of Onset  . Leukemia Father   . Diabetes Mother     SOCIAL HISTORY:   Social History   Tobacco Use  . Smoking status: Never Smoker  . Smokeless tobacco: Never Used  Substance Use Topics  . Alcohol use: Yes    Alcohol/week: 0.0 standard drinks    Comment: "social drinker"   . Drug use: No    ALLERGIES:  is allergic to penicillins.  MEDICATIONS:  Current Outpatient Medications  Medication Sig Dispense Refill  . folic acid (FOLVITE) 1 MG tablet TAKE 1 TABLET (1 MG TOTAL) BY MOUTH DAILY. 30 tablet 11  . furosemide (LASIX) 20 MG tablet TAKE 1 TABLET (20 MG TOTAL) BY MOUTH DAILY. ONE TABLET DAILY AS NEEDED FOR SWELLING 90 tablet 1  . meloxicam (MOBIC) 7.5 MG tablet Take 7.5 mg by mouth daily.     Marland Kitchen spironolactone (ALDACTONE) 25 MG tablet TAKE 1 TABLET BY MOUTH EVERY DAY 90 tablet 3  .  loratadine (CLARITIN) 10 MG tablet Take 1 tablet (10 mg total) by mouth daily. (Patient not taking: Reported on 02/20/2019)    . Multiple Vitamin (MULTIVITAMIN) capsule Take 1 capsule by mouth daily. Reported on 02/24/2016    . Omega-3 Fatty Acids (FISH OIL) 1000 MG CAPS Take 1 capsule by mouth daily. Reported on 02/24/2016    . senna (SENOKOT) 8.6 MG tablet Take 1 tablet by mouth daily. Take as needed around time of chemotherapy    . tadalafil (ADCIRCA/CIALIS) 20 MG  tablet TAKE 1 TABLET BY MOUTH EVERY 2 DAYS AS NEEDED. (Patient not taking: Reported on 02/20/2019) 6 tablet 11  . vitamin B-12 (CYANOCOBALAMIN) 1000 MCG tablet Take 1,000 mcg by mouth daily.     No current facility-administered medications for this visit.    Facility-Administered Medications Ordered in Other Visits  Medication Dose Route Frequency Provider Last Rate Last Dose  . sodium chloride 0.9 % injection 10 mL  10 mL Intracatheter PRN [name], MD   10 mL at 05/27/15 1543  . sodium chloride 0.9 % injection 10 mL  10 mL Intracatheter PRN [name], MD   10 mL at 06/17/15 1510  . sodium chloride 0.9 % injection 10 mL  10 mL Intravenous PRN [name], MD   10 mL at 07/08/15 1340    PHYSICAL EXAMINATION: ECOG PERFORMANCE STATUS: 0 - Asymptomatic  BP 134/86 (BP Location: Right Arm, Patient Position: Sitting, Cuff Size: Normal)   Pulse (!) 101   Temp 97.9 F (36.6 C) (Tympanic)   Resp 20   Ht '5\' 10"'  (1.778 m)   Wt 208 lb 9.6 oz (94.6 kg)   BMI 29.93 kg/m   Filed Weights   02/20/19 1339  Weight: 208 lb 9.6 oz (94.6 kg)    Physical Exam  Constitutional: He is oriented to person, place, and time and well-developed, well-nourished, and in no distress.  HENT:  Head: Normocephalic and atraumatic.  Mouth/Throat: Oropharynx is clear and moist. No oropharyngeal exudate.  Eyes: Pupils are equal, round, and reactive to light.  Neck: Normal range of motion. Neck supple.  Cardiovascular: Normal rate and regular rhythm.  Pulmonary/Chest: No respiratory distress. He has no wheezes.  Abdominal: Soft. Bowel sounds are normal. He exhibits no distension and no mass. There is no abdominal tenderness. There is no rebound and no guarding.  Musculoskeletal: Normal range of motion.        General: Edema present. No tenderness.     Comments: Grade 1 swelling in the legs bilaterally.  Neurological: He is alert and oriented to person, place, and time.  Skin: Skin is warm.   Psychiatric: Affect normal.       LABORATORY DATA:  I have reviewed the data as listed    Component Value Date/Time   NA 132 (L) 02/20/2019 1309   NA 138 12/10/2014 1341   K 3.7 02/20/2019 1309   K 3.6 12/10/2014 1341   CL 99 02/20/2019 1309   CL 102 12/10/2014 1341   CO2 23 02/20/2019 1309   CO2 30 12/10/2014 1341   GLUCOSE 198 (H) 02/20/2019 1309   GLUCOSE 160 (H) 12/10/2014 1341   BUN 19 02/20/2019 1309   BUN 21 (H) 12/10/2014 1341   CREATININE 1.21 02/20/2019 1309   CREATININE 1.10 03/25/2015 1453   CALCIUM 8.9 02/20/2019 1309   CALCIUM 8.4 (L) 12/10/2014 1341   PROT 7.6 02/20/2019 1309   PROT 6.6 12/10/2014 1341   ALBUMIN 4.3 02/20/2019 1309   ALBUMIN 3.4 12/10/2014  1341   AST 23 02/20/2019 1309   AST 17 12/10/2014 1341   ALT 18 02/20/2019 1309   ALT 23 12/10/2014 1341   ALKPHOS 43 02/20/2019 1309   ALKPHOS 50 12/10/2014 1341   BILITOT 0.9 02/20/2019 1309   BILITOT 0.5 12/10/2014 1341   GFRNONAA >60 02/20/2019 1309   GFRNONAA >60 03/25/2015 1453   GFRAA >60 02/20/2019 1309   GFRAA >60 03/25/2015 1453    No results found for: SPEP, UPEP  Lab Results  Component Value Date   WBC 6.0 02/20/2019   NEUTROABS 4.0 02/20/2019   HGB 14.2 02/20/2019   HCT 40.6 02/20/2019   MCV 93.8 02/20/2019   PLT 168 02/20/2019      Chemistry      Component Value Date/Time   NA 132 (L) 02/20/2019 1309   NA 138 12/10/2014 1341   K 3.7 02/20/2019 1309   K 3.6 12/10/2014 1341   CL 99 02/20/2019 1309   CL 102 12/10/2014 1341   CO2 23 02/20/2019 1309   CO2 30 12/10/2014 1341   BUN 19 02/20/2019 1309   BUN 21 (H) 12/10/2014 1341   CREATININE 1.21 02/20/2019 1309   CREATININE 1.10 03/25/2015 1453      Component Value Date/Time   CALCIUM 8.9 02/20/2019 1309   CALCIUM 8.4 (L) 12/10/2014 1341   ALKPHOS 43 02/20/2019 1309   ALKPHOS 50 12/10/2014 1341   AST 23 02/20/2019 1309   AST 17 12/10/2014 1341   ALT 18 02/20/2019 1309   ALT 23 12/10/2014 1341   BILITOT 0.9  02/20/2019 1309   BILITOT 0.5 12/10/2014 1341       RADIOGRAPHIC STUDIES: I have personally reviewed the radiological images as listed and agreed with the findings in the report. No results found.   ASSESSMENT & PLAN:  Cancer of hilus of left lung ([place]) # Adenocarcinoma the lung; stage IV; jan 2020-CT scan chest/a/p-shows no evidence of metastatic disease. Stable.   #Hold Alimta today labs today reviewed;-within normal limits.  Long discussion with the patient regarding the current covid pandemic/the risk of infection especially cancer diagnosis/chemotherapy.  Will order CT scan at next visit  # Bilateral lower extremity swelling-intermittent-continue Lasix.  Stable  # Chronic joint pains-NSAIDs as needed.stable.   # DISPOSITION:  #  HOLD Treatment today; de-access # follow up in 3 weeks [covid]-cbc/cmp-Alimta;-Dr.B   Orders Placed This Encounter  Procedures  . CBC with Differential    Standing Status:   Future    Standing Expiration Date:   02/20/2020  . Comprehensive metabolic panel    Standing Status:   Future    Standing Expiration Date:   02/20/2020   All questions were answered. The patient knows to call the clinic with any problems, questions or concerns.      Mitchell Sickle, MD 02/23/2019 1:33 PM

## 2019-02-20 NOTE — Research (Signed)
Mitchell Mosley returns to clinic today for consideration of cycle #635 Alimta infusion on the [employer] research study. Patient denies any new adverse events since last in the clinic. Reports continued mild (1+) edema in his BLE, mostly in his ankles, and verifies he is still taking both Lasix and Spironolactone daily for the edema. He reports no changes the odd itching in his palms, the bottoms of his feet sporadically and in his right upper inner arm, and states is relieved with use of generic skin lotion. Patient reports mild fatigue that begins the day after infusion and can last 3-4 days before he gradually returns to normal energy level. Patient's VS remain stable today. His weight remains unchanged at 208 lbs 10 oz(94.6kg) today. Mitchell Mosley also verifies that he is taking Folic Acid and Mobic daily as prescribed, as well as Lasix and Spironolactone. Patient's glucose level is elevated again today at 198mg /dL non-fasting; which is still elevated at Grade 1. Platelets remain wnl at 168,000K/uL. Sodium level is low today at 127mmol/L - which is grade 1, and serum creatinine is also wnl at 1.21mg /dL. CBC and chemistry levels are otherwise within acceptable parameters; however, due to increasing concerns related to COVID-19 virus, Dr. Rogue Bussing and Mr. Silvestro have elected to hold today's treatment in order to avoid compromising patient's immune system with the chemotherapy. He will return to clinic in 3 weeks on 03/13/2019 for evaluation of his next maintenance Alimta on the S130 protocol. CT scan is due on 03/30/2019. Current and ongoing adverse events with grade and attribution are as follows:   Adverse Event Log Study/Protocol: Mitchell Mosley S130 Cycle: 634 Event Grade Onset Date Resolved Date Drug Name Pemetrexed Attribution Treatment Comments  Hyponatremia Grade 1 02/20/2019 10/17/18  11/07/18  Unlikely  Sodium now 132mg /dL  Cough Grade 1 09/19/18 10/17/18  Unlikely    Nasal congestion Grade 1  09/19/18 10/17/18  Unlikely    Elevated serum creatinine Grade 1 10/17/18 06/16/18 07/25/18 11/07/18 07/04/18 09/05/18  Possible  Increase fluids Serum creatinine is 1.21 mg/dL   Edema (mild) Grade 1  12/06/2017   Possible  Trace in right and 1+ edema in left ankle  Fatigue Grade1  01/14/16   Possible    Tendonitis Grade 1  09/21/16   Unrelated  Meloxicam  Hypocalcemia Grade 1 05/26/18 06/16/18  Possible  Resolved, Calcium level WNL  Hyperglycemia Grade 1 05/26/18 11/09/15  09/08/16  Unlikely  Non-fasting glucose was 176mg /dL  Thrombocytopenia Grade 1 09/26/18 07/04/18 05/26/18 12/06/17 08/02/17 11/27/18 07/25/18 06/16/18 08/02/17  08/23/17  Possible  Platelet count 160,000 today  Nausea Grade 1 11/16/17 11/17/17  Unrelated  R/T antibiotic  Abrasions on Right knuckles Grade 1 02/03/18 02/28/18  Unrelated  r/t Dog walking accident  Pruritis Grade 1  04/19/17   Possible Uses generic lotion with relief deep itching under the skin of palms, feet & right upper arm   Elevated cholesterol Grade 1 03/15/14   Unrelated  No ongoing monitoring available  Valvular Heart Disease Grade 1 10/19/13   Unrelated      Erectile Dysfunction  Grade 2 12/11/15   Possible Cialis   Skin hyperpigmentation Grade 1 07/08/15   Unrelated  Medial aspect of feel bil  Aneurysm of Ascending Aorta Grade 1 10/14/13   Unrelated  mentioned on CT scans  Possible arthritis of Left Knee Grade 1 05/06/15   Unrelated    Elevated BUN Grade 1 09/26/18 07/25/18 10/17/18   08/18/18  Possible    Yolande Jolly,  BSN, MHA, OCN 02/20/2019 2:30 PM

## 2019-02-24 ENCOUNTER — Telehealth: Payer: Self-pay | Admitting: *Deleted

## 2019-02-24 NOTE — Telephone Encounter (Signed)
Patient Mitchell Mosley called this morning to report that he has been experiencing increased allergy symptoms and is concerned that he is getting a sinus infection. Patient requests a Z-pak in effort to stay ahead of the sinus infection. He also states he is going to begin working remotely from home and questions how long Dr. Rogue Bussing would recommend he continue this. Dr. Rogue Bussing was consulted about all of these questions and states he does not want to prescribe the Z-pak at this time. He recommends that the patient try Claritin or Zyrtec along with Mucinex or Sudafed for the allergy symptoms. He also recommends that the patient stay at home for 14 days. Patient called back and all of this information was relayed to him. States he started taking Claritin yesterday and was open to the other suggestions of OTC medications first. Also suggested saline spray to cleanse allergens from nostrils. Instructed to call us back with an update or if he develops additional symptoms. Yolande Jolly, BSN, MHA, OCN 02/24/2019 10:52 AM

## 2019-03-04 ENCOUNTER — Telehealth: Payer: Self-pay | Admitting: *Deleted

## 2019-03-05 NOTE — Telephone Encounter (Signed)
I agree. Just talk to me next week when you are in the clinic. GB

## 2019-03-05 NOTE — Telephone Encounter (Signed)
Received t/c from patient Mitchell Mosley at 4:50 PM on 03/04/2019 with update on his allergy symptoms. Patient states he has been taking Claritin daily as directed by Dr.Brahmanday. Also states he has taken Mucinex twice daily at times for his symptoms. Reports feeling very fatigued over the weekend - states it was as if he had received his chemotherapy.He also reports a few waves of nausea over the weekend. Patient reporting he feels much better today and his allergy symptoms have improved. He denies experiencing a new cough, sore throat or fever at present or over the weekend. States he is wondering whether Dr. Rogue Bussing will give him chemo next week, Informed that he may not receive chemotherapy, but will still need to get a B12 injection to stay on track. [name] also states he would like to have his CT scan on 03/30/2019 as planned. Informed that I will discuss this with Dr. Rogue Bussing when I am back in clinic next week and give him a call back. Yolande Jolly, BSN, MHA, OCN 03/05/2019 10:06 AM

## 2019-03-13 ENCOUNTER — Other Ambulatory Visit: Payer: Self-pay | Admitting: Internal Medicine

## 2019-03-13 ENCOUNTER — Inpatient Hospital Stay (HOSPITAL_BASED_OUTPATIENT_CLINIC_OR_DEPARTMENT_OTHER): Payer: BLUE CROSS/BLUE SHIELD | Admitting: Internal Medicine

## 2019-03-13 ENCOUNTER — Other Ambulatory Visit: Payer: Self-pay

## 2019-03-13 ENCOUNTER — Encounter: Payer: Self-pay | Admitting: Internal Medicine

## 2019-03-13 ENCOUNTER — Inpatient Hospital Stay: Payer: BLUE CROSS/BLUE SHIELD

## 2019-03-13 ENCOUNTER — Inpatient Hospital Stay: Payer: BLUE CROSS/BLUE SHIELD | Attending: Internal Medicine

## 2019-03-13 ENCOUNTER — Encounter: Payer: Self-pay | Admitting: *Deleted

## 2019-03-13 DIAGNOSIS — Z006 Encounter for examination for normal comparison and control in clinical research program: Secondary | ICD-10-CM

## 2019-03-13 DIAGNOSIS — M2550 Pain in unspecified joint: Secondary | ICD-10-CM | POA: Insufficient documentation

## 2019-03-13 DIAGNOSIS — C3490 Malignant neoplasm of unspecified part of unspecified bronchus or lung: Secondary | ICD-10-CM

## 2019-03-13 DIAGNOSIS — M7989 Other specified soft tissue disorders: Secondary | ICD-10-CM | POA: Insufficient documentation

## 2019-03-13 DIAGNOSIS — G8929 Other chronic pain: Secondary | ICD-10-CM | POA: Diagnosis not present

## 2019-03-13 DIAGNOSIS — D696 Thrombocytopenia, unspecified: Secondary | ICD-10-CM

## 2019-03-13 DIAGNOSIS — C3402 Malignant neoplasm of left main bronchus: Secondary | ICD-10-CM | POA: Diagnosis not present

## 2019-03-13 LAB — COMPREHENSIVE METABOLIC PANEL
ALT: 19 U/L (ref 0–44)
AST: 29 U/L (ref 15–41)
Albumin: 4.4 g/dL (ref 3.5–5.0)
Alkaline Phosphatase: 42 U/L (ref 38–126)
Anion gap: 10 (ref 5–15)
BUN: 18 mg/dL (ref 6–20)
CO2: 27 mmol/L (ref 22–32)
Calcium: 9.5 mg/dL (ref 8.9–10.3)
Chloride: 98 mmol/L (ref 98–111)
Creatinine, Ser: 1.22 mg/dL (ref 0.61–1.24)
GFR calc Af Amer: >60 mL/min (ref >60)
GFR calc non Af Amer: >60 mL/min (ref >60)
Glucose, Bld: 123 mg/dL — ABNORMAL HIGH (ref 70–99)
Potassium: 4.2 mmol/L (ref 3.5–5.1)
Sodium: 135 mmol/L (ref 135–145)
Total Bilirubin: 0.7 mg/dL (ref 0.3–1.2)
Total Protein: 8 g/dL (ref 6.5–8.1)

## 2019-03-13 LAB — CBC WITH DIFFERENTIAL/PLATELET
Abs Immature Granulocytes: 0.02 10*3/uL (ref 0.00–0.07)
Basophils Absolute: 0.1 10*3/uL (ref 0.0–0.1)
Basophils Relative: 1 %
Eosinophils Absolute: 0.1 10*3/uL (ref 0.0–0.5)
Eosinophils Relative: 1 %
HCT: 43.8 % (ref 39.0–52.0)
Hemoglobin: 14.9 g/dL (ref 13.0–17.0)
Immature Granulocytes: 0 %
Lymphocytes Relative: 25 %
Lymphs Abs: 1.6 10*3/uL (ref 0.7–4.0)
MCH: 32.3 pg (ref 26.0–34.0)
MCHC: 34 g/dL (ref 30.0–36.0)
MCV: 95 fL (ref 80.0–100.0)
Monocytes Absolute: 0.5 10*3/uL (ref 0.1–1.0)
Monocytes Relative: 8 %
Neutro Abs: 4.2 10*3/uL (ref 1.7–7.7)
Neutrophils Relative %: 65 %
Platelets: 166 10*3/uL (ref 150–400)
RBC: 4.61 MIL/uL (ref 4.22–5.81)
RDW: 12.2 % (ref 11.5–15.5)
WBC: 6.5 10*3/uL (ref 4.0–10.5)
nRBC: 0 % (ref 0.0–0.2)

## 2019-03-13 MED ORDER — CYANOCOBALAMIN 1000 MCG/ML IJ SOLN
1000.0000 ug | Freq: Once | INTRAMUSCULAR | Status: AC
  Administered 2019-03-13: 1000 ug via INTRAMUSCULAR

## 2019-03-13 NOTE — Assessment & Plan Note (Addendum)
#   Adenocarcinoma the lung; stage IV; jan 2020-CT scan chest/a/p-shows no evidence of metastatic disease.  #Reviewed with the patient given the current pandemic/risk of infection; I think it is reasonable to hold off chemotherapy at this time.  We will again reassess in 3 weeks.  With regards to surveillance scan-give end of April; will check with clinical trials if could also be pushed out by a month or so.  # Bilateral lower extremity swelling-intermittent-continue Lasix.  Stable  # Chronic joint pains-NSAIDs as needed.stable  # DISPOSITION:  #Follow-up in 3 weeks/webex- MD.

## 2019-03-13 NOTE — Research (Signed)
Mitchell Mosley returns to clinic today for cycle #636 visit on the [employer] S130 research study. Mitchell Mosley and Dr. Rogue Bussing have already determined that the patient will NOT receive Alimta again today due to the increasing prevalence of the COVID-19 virus and in effort to prevent further immunocompromise. Patient visit was completed remotely today. He denies any new adverse events since last in the clinic. Reports continued mild edema in his BLE, mostly in his ankles, and verifies he is still taking both Lasix and Spironolactone daily to control this. He reports no changes the odd itching in his palms, the bottoms of his feet sporadically and in his right upper inner arm, and states is relieved with use of generic skin lotion. Patient reported mild fatigue and sinus/allergy symptoms that began on 02/24/2019 and lasted about 5 days total before resolving. Patient used Mucinex and Claritin before his symptoms gradually improved. Patient's VS and weight were not measured today. He was seen for labwork and had CBC and Chemistries collected, and was given a vitamin B12 injection per protocol requirements. CBC and cheinstry results were all wnl with the exception of a non-fasting glucose level of 123mg /dL. As stated earlier, due to continued increasing concerns related to COVID-19 virus, Dr. Rogue Bussing and Mitchell Mosley have elected to hold today's treatment in order to avoid further compromising patient's immune system with the chemotherapy. He will return to clinic in 3 weeks on 04/03/2019 for evaluation of his next maintenance Alimta on the S130 protocol. Ratika Dahwan, site monitor, was notified that patient did not receive his Alimta infusion again today due to COVID-19 precautions. CT scan is due on 03/30/2019; however Dr. Rogue Bussing would like to put this off for 4-6 weeks as additional precaution since scans have been stable for several years now.  Mitchell Mosley agrees with this as well. Will send query to the study to  determine whether this will be allowed. Current and ongoing adverse events with grade and attribution are as follows:   Adverse Event Log Study/Protocol: [name] S130 Cycle: 635 Event Grade Onset Date Resolved Date Drug Name Pemetrexed Attribution Treatment Comments  Hyponatremia Grade 1 02/20/2019 10/17/18 03/13/2019 11/07/18  Unlikely  Sodium now 135mg /dL  Cough Grade 1 09/19/18 10/17/18  Unlikely    Nasal congestion Grade 1 09/19/18 10/17/18  Unlikely    Elevated serum creatinine Grade 1 10/17/18 06/16/18 07/25/18 11/07/18 07/04/18 09/05/18  Possible  Increase fluids   Edema (mild) Grade 1  12/06/2017   Possible  Trace in right and 1+ edema in left ankle  Fatigue Grade1  01/14/16   Possible    Tendonitis Grade 1  09/21/16   Unrelated  Meloxicam  Hypocalcemia Grade 1 05/26/18 06/16/18  Possible  Resolved, Calcium level WNL  Hyperglycemia Grade 1 05/26/18 11/09/15  09/08/16  Unlikely  Non-fasting glucose was 123mg /dL  Thrombocytopenia Grade 1 09/26/18 07/04/18 05/26/18 12/06/17 08/02/17 11/27/18 07/25/18 06/16/18 08/02/17  08/23/17  Possible  Platelet count 160,000 today  Nausea Grade 1 11/16/17 11/17/17  Unrelated  R/T antibiotic  Abrasions on Right knuckles Grade 1 02/03/18 02/28/18  Unrelated  r/t Dog walking accident  Pruritis Grade 1  04/19/17   Possible Uses generic lotion with relief deep itching under the skin of palms, feet & right upper arm   Elevated cholesterol Grade 1 03/15/14   Unrelated  No ongoing monitoring available  Valvular Heart Disease Grade 1 10/19/13   Unrelated      Erectile Dysfunction  Grade 2 12/11/15   Possible Cialis  Skin hyperpigmentation Grade 1 07/08/15   Unrelated  Medial aspect of feel bil  Aneurysm of Ascending Aorta Grade 1 10/14/13   Unrelated  mentioned on CT scans  Possible arthritis of Left Knee Grade 1 05/06/15   Unrelated    Elevated BUN Grade 1 09/26/18 07/25/18 10/17/18    08/18/18  Possible    Yolande Jolly, BSN, MHA, OCN 03/13/2019 2:29 PM

## 2019-03-13 NOTE — Progress Notes (Signed)
I connected with Mitchell Mosley on 03/13/2019 at  2:00 PM EDT by telephone visit and verified that I am speaking with the correct person using two identifiers.  I discussed the limitations, risks, security and privacy concerns of performing an evaluation and management service by telemedicine and the availability of in-person appointments. I also discussed with the patient that there may be a patient responsible charge related to this service. The patient expressed understanding and agreed to proceed.    Other persons participating in the visit and their role in the encounter: none  Patient's location: home   Provider's location: office  Oncology History Overview Note  # 2012- METASTATIC ADENO CA of LEFT LUNG [acinar pattern] s/p MED LN Bx [NEG- EGFR/K-ras/ALK mutation];[name]; [name]; CT JULY 2016- NED; JAN 2018- NED  # Thoracic Aneurysm [4cm- stable]  ----------------------------------------------------------    DIAGNOSIS: [2012 ] Adenocarcinoma lung  STAGE: 4       ;GOALS: Palliative  CURRENT/MOST RECENT THERAPY '[ ]'  Alimta maintenance    Cancer of hilus of left lung ([place])  07/20/2016 Initial Diagnosis   Cancer of hilus of left lung ([place])      Chief Complaint: lung cancer  History of present illness:Mitchell Mosley [age] y.o.  male with history of metastatic lung cancer/NED-on maintenance Alimta.  Patient denies any headaches or nausea vomiting.  Denies any shortness of breath or cough.  No chest pain.  Observation/objective: cbc/cmp- normal.   Assessment and plan: Cancer of hilus of left lung ([place]) # Adenocarcinoma the lung; stage IV; jan 2020-CT scan chest/a/p-shows no evidence of metastatic disease.  #Reviewed with the patient given the current pandemic/risk of infection; I think it is reasonable to hold off chemotherapy at this time.  We will again reassess in 3 weeks.   With regards to surveillance scan-give end of April; will check with clinical trials if could also be pushed out by a month or so.  # Bilateral lower extremity swelling-intermittent-continue Lasix.  Stable  # Chronic joint pains-NSAIDs as needed.stable  # DISPOSITION:  #Follow-up in 3 weeks/webex- MD.     Follow-up instructions:  I discussed the assessment and treatment plan with the patient.  The patient was provided an opportunity to ask questions and all were answered.  The patient agreed with the plan and demonstrated understanding of instructions.  The patient was advised to call back or seek an in person evaluation if the symptoms worsen or if the condition fails to improve as anticipated.  I provided 12  minutes of non face-to-face telephone visit time during this encounter, and > 50% was spent counseling as documented under my assessment & plan.   Dr. Charlaine Dalton [place] at [facility] 05/15/2019 8:08 AM

## 2019-03-24 ENCOUNTER — Telehealth: Payer: Self-pay | Admitting: *Deleted

## 2019-03-24 ENCOUNTER — Telehealth: Payer: Self-pay | Admitting: Internal Medicine

## 2019-03-24 NOTE — Telephone Encounter (Signed)
Discussed with Zigmund Daniel.  Please cancel pt's chemo/labs on may 1st.  Change the appt to VIDEO visit only- Dr.B

## 2019-03-25 NOTE — Telephone Encounter (Signed)
Late entry: T/C made to [facility] to inform patient that his CT scan has been rescheduled to 05/11/2019 per Dr. Aletha Halim request and with Study sponsor approval. Dr. Rogue Bussing also requested that I ask the patient how he is feeling about Alimta infusions for the near future. Mr. Wadhwa states he does not want to take the infusion that is currently scheduled on 04/03/2019, but that he may want to consider restarting Alimta on 04/24/2019 depending on the status of the current COVID-19 pandemic. Patient states he is working from home as much as possible, and wearing a mask and using good handwashing when he has to physically be at work. Dr. Rogue Bussing was informed of patient preference and he plans to cancel previously scheduled labs and in-clinic visit, but will still assess patient either by Tele-health or WebEx visit on 04/03/2019. Mr. Fyfe agrees with this plan. Also informed Ratika Dahwan, site study monitor from [facility] and she will inform the sponsor and seek further guidance on holding Alimta and reporting the IP omission deviations in the midst of COVID-19 pandemic. Yolande Jolly, BSN, MHA, OCN 03/25/2019 8:21 AM

## 2019-03-30 ENCOUNTER — Ambulatory Visit: Payer: BLUE CROSS/BLUE SHIELD

## 2019-04-02 ENCOUNTER — Other Ambulatory Visit: Payer: Self-pay

## 2019-04-03 ENCOUNTER — Other Ambulatory Visit: Payer: BLUE CROSS/BLUE SHIELD

## 2019-04-03 ENCOUNTER — Inpatient Hospital Stay: Payer: BLUE CROSS/BLUE SHIELD | Attending: Internal Medicine | Admitting: Internal Medicine

## 2019-04-03 ENCOUNTER — Ambulatory Visit: Payer: BLUE CROSS/BLUE SHIELD

## 2019-04-03 ENCOUNTER — Encounter: Payer: Self-pay | Admitting: *Deleted

## 2019-04-03 DIAGNOSIS — Z006 Encounter for examination for normal comparison and control in clinical research program: Secondary | ICD-10-CM | POA: Insufficient documentation

## 2019-04-03 DIAGNOSIS — C3402 Malignant neoplasm of left main bronchus: Secondary | ICD-10-CM | POA: Diagnosis not present

## 2019-04-03 DIAGNOSIS — C3490 Malignant neoplasm of unspecified part of unspecified bronchus or lung: Secondary | ICD-10-CM

## 2019-04-03 NOTE — Research (Signed)
Mr. Klahn was contacted by phone for cycle #637 visit assessment on the [employer] S130 research study. Mr. Staver and Dr. Rogue Bussing have previously determined that the patient will NOT receive Alimta again today due to the ongoing prevalence of the COVID-19 virus and increased risks to the patient for further immunocompromise with Alimta infusion. Patient visit with Dr. Rogue Bussing was completed remotely today. He reports the only new adverse event/concern since he last spoke with MD is an occasional pain in his left thigh area, which he states is completely gone at present. Instructed patient to call if this becomes a sharp or constant pain due to risk for DVT, and Mr. Grinder agrees. Reports continued mild edema in his BLE, mostly in his ankles, and verifies he is still taking both Lasix and Spironolactone daily to control this. He reports no changes the odd itching in his palms, the bottoms of his feet sporadically and in his right upper inner arm, and states this is relieved with use of generic skin lotion as needed. Patient reports his fatigue has resolved since not receiving Alimta. 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 Patient's VS, weight and lab work were not collected today. Mr. Kray states he and Dr. Rogue Bussing discussed holding the Alimta infusions until after he has his next CT scan, which has been re-scheduled for 05/11/2019 at 8:00am. Informed patient that the study sponsor has approved holding the Alimta infusions until June, 2020. Dr. Rogue Bussing has scheduled an appointment for the patient in 3 weeks in case the COVID-19 situation improves. Current and ongoing adverse events with grade and attribution are as follows:   Adverse Event Log Study/Protocol: [name] S130 Cycle: 636 Event Grade Onset Date Resolved Date Drug Name Pemetrexed Attribution  Treatment Comments  Hyponatremia Grade 1 02/20/2019 10/17/18 03/13/2019 11/07/18  Unlikely  Sodium now 135mg /dL  Cough Grade 1 09/19/18 10/17/18  Unlikely    Nasal congestion Grade 1 09/19/18 10/17/18  Unlikely    Elevated serum creatinine Grade 1 10/17/18 06/16/18 07/25/18 11/07/18 07/04/18 09/05/18  Possible  Increase fluids   Edema (mild) Grade 1  12/06/2017   Possible  Trace in right and 1+ edema in left ankle  Fatigue Grade1  01/14/16 03/04/2019  Possible    Tendonitis Grade 1  09/21/16   Unrelated  Meloxicam  Hypocalcemia Grade 1 05/26/18 06/16/18  Possible  Resolved, Calcium level WNL  Hyperglycemia Grade 1 05/26/18 11/09/15  09/08/16  Unlikely  Non-fasting glucose was 123mg /dL  Thrombocytopenia Grade 1 09/26/18 07/04/18 05/26/18 12/06/17 08/02/17 11/27/18 07/25/18 06/16/18 08/02/17  08/23/17  Possible  Platelet count 160,000 today  Nausea Grade 1 11/16/17 11/17/17  Unrelated  R/T antibiotic  Abrasions on Right knuckles Grade 1 02/03/18 02/28/18  Unrelated  r/t Dog walking accident  Pruritis Grade 1  04/19/17   Possible Uses generic lotion with relief deep itching under the skin of palms, feet & right upper arm   Elevated cholesterol Grade 1 03/15/14   Unrelated  No ongoing monitoring available  Valvular Heart Disease Grade 1 10/19/13   Unrelated      Erectile Dysfunction  Grade 2 12/11/15   Possible Cialis   Skin hyperpigmentation Grade 1 07/08/15   Unrelated  Medial aspect of feel bil  Aneurysm of Ascending Aorta Grade 1 10/14/13   Unrelated  mentioned on CT scans  Possible arthritis of  Left Knee Grade 1 05/06/15   Unrelated    Elevated BUN Grade 1 09/26/18 07/25/18 10/17/18   08/18/18  Possible    Yolande Jolly, BSN, MHA, OCN 04/03/2019 2:56 PM

## 2019-04-03 NOTE — Progress Notes (Signed)
I connected with [name] on 04/03/19 at  1:45 PM EDT by video enabled telemedicine visit and verified that I am speaking with the correct person using two identifiers.  I discussed the limitations, risks, security and privacy concerns of performing an evaluation and management service by telemedicine and the availability of in-person appointments. I also discussed with the patient that there may be a patient responsible charge related to this service. The patient expressed understanding and agreed to proceed.    Other persons participating in the visit and their role in the encounter: none  Patient's location: home  Provider's location: office   Oncology History   # 2012- METASTATIC ADENO CA of LEFT LUNG [acinar pattern] s/p MED LN Bx [NEG- EGFR/K-ras/ALK mutation];[name]; [name]; CT JULY 2016- NED; JAN 2018- NED  # Thoracic Aneurysm [4cm- stable]  ----------------------------------------------------------    DIAGNOSIS: [2012 ] Adenocarcinoma lung  STAGE: 4       ;GOALS: Palliative  CURRENT/MOST RECENT THERAPY _0  Alimta maintenance      Cancer of hilus of left lung ([place])   07/20/2016 Initial Diagnosis    Cancer of hilus of left lung ([place])      Chief Complaint: lung cancer    History of present illness:Mitchell Mosley [age] y.o.  male with history of stage IV lung cancer currently NED currently on maintenance Alimta.  Patient's chemotherapy is currently on hold because of coronavirus pandemic/patient preference etc.  Patient complains of mild discomfort in the left mid thigh going on for the last 1 week.  Complains of chronic mild swelling in the legs.  Not any worse.  Observation/objective:  Assessment and plan: Cancer of hilus of left lung ([place]) # Adenocarcinoma the lung; stage IV; jan 2020-CT scan chest/a/p-shows no evidence of metastatic disease.  #Had a long  discussion with patient regarding pros and cons of restarting systemic chemotherapy.  Patient is willing to restart chemotherapy in approximately 3 weeks-however he will make a final decision few days prior/and will let us know.  Await scans as discussed-on June 8.th.  Discussed with clinical trials nurse/clinical trials coordinator.  # Bilateral lower extremity swelling-intermittent stable continue Lasix.  #Chronic mild joint pain stable.  Continue NSAIDs.   # left thigh- discomfort- 1 week-likely musculoskeletal.  No clinical concern for blood clots or malignancy worsen would recommend x-rays call us to let us know.  # DISPOSITION:  #Follow-up in 3 weeks-cbc/cmp/alimta; MD-VIDEO-Dr.B.     Follow-up instructions:  I discussed the assessment and treatment plan with the patient.  The patient was provided an opportunity to ask questions and all were answered.  The patient agreed with the plan and demonstrated understanding of instructions.  The patient was advised to call back or seek an in person evaluation if the symptoms worsen or if the condition fails to improve as anticipated.  Dr. Charlaine Dalton [place] at [facility] 04/03/2019 2:34 PM

## 2019-04-03 NOTE — Progress Notes (Signed)
Patient c/o leg cramps.

## 2019-04-03 NOTE — Assessment & Plan Note (Addendum)
#   Adenocarcinoma the lung; stage IV; jan 2020-CT scan chest/a/p-shows no evidence of metastatic disease.  #Had a long discussion with patient regarding pros and cons of restarting systemic chemotherapy.  Patient is willing to restart chemotherapy in approximately 3 weeks-however he will make a final decision few days prior/and will let us know.  Await scans as discussed-on June 8.th.  Discussed with clinical trials nurse/clinical trials coordinator.  # Bilateral lower extremity swelling-intermittent stable continue Lasix.  #Chronic mild joint pain stable.  Continue NSAIDs.   # left thigh- discomfort- 1 week-likely musculoskeletal.  No clinical concern for blood clots or malignancy worsen would recommend x-rays call us to let us know.  # DISPOSITION:  #Follow-up in 3 weeks-cbc/cmp/alimta; MD-VIDEO-Dr.B.

## 2019-04-20 ENCOUNTER — Telehealth: Payer: Self-pay | Admitting: *Deleted

## 2019-04-20 NOTE — Telephone Encounter (Signed)
T/C made to patient Mitchell Mosley to inquire whether he plans to receive his Alimta infusion on Friday - 04/24/19. Patient states he is still a little concerned about the COVID-19 virus since the number of confirmed cases in [facility] has risen significantly over the past 2 weeks. Patient states he would prefer to wait until after he has his CT scan on 05/11/2019, and then resume his Alimta infusions. Informed [name] that the study sponsor has approved for him to continue to hold treatments until June, 2020. Message sent to Dr. Rogue Bussing, who has also approved holding and rescheduling patient's Alimta infusion until 05/15/2019, after he has had his follow up CT scan. Yolande Jolly, BSN, MHA, OCN 04/20/2019  9:53 AM

## 2019-04-24 ENCOUNTER — Other Ambulatory Visit: Payer: BLUE CROSS/BLUE SHIELD

## 2019-04-24 ENCOUNTER — Ambulatory Visit: Payer: BLUE CROSS/BLUE SHIELD | Admitting: Internal Medicine

## 2019-04-24 ENCOUNTER — Ambulatory Visit: Payer: BLUE CROSS/BLUE SHIELD

## 2019-05-11 ENCOUNTER — Ambulatory Visit
Admission: RE | Admit: 2019-05-11 | Discharge: 2019-05-11 | Disposition: A | Discharge status: Home / Self Care | Payer: BC Managed Care – PPO | Source: Ambulatory Visit | Attending: Internal Medicine | Admitting: Internal Medicine

## 2019-05-11 ENCOUNTER — Other Ambulatory Visit: Payer: Self-pay

## 2019-05-11 DIAGNOSIS — C3490 Malignant neoplasm of unspecified part of unspecified bronchus or lung: Secondary | ICD-10-CM

## 2019-05-11 MED ORDER — IOHEXOL 300 MG/ML  SOLN
85.0000 mL | Freq: Once | INTRAMUSCULAR | Status: AC | PRN
  Administered 2019-05-11: 85 mL via INTRAVENOUS

## 2019-05-14 ENCOUNTER — Telehealth: Payer: Self-pay | Admitting: Internal Medicine

## 2019-05-15 ENCOUNTER — Inpatient Hospital Stay: Payer: BC Managed Care – PPO

## 2019-05-15 ENCOUNTER — Other Ambulatory Visit: Payer: Self-pay

## 2019-05-15 ENCOUNTER — Inpatient Hospital Stay: Payer: BC Managed Care – PPO | Attending: Internal Medicine | Admitting: Internal Medicine

## 2019-05-15 VITALS — BP 117/81 | HR 80 | Temp 97.5°F | Resp 18 | Wt 209.0 lb

## 2019-05-15 DIAGNOSIS — M2550 Pain in unspecified joint: Secondary | ICD-10-CM | POA: Diagnosis not present

## 2019-05-15 DIAGNOSIS — D6959 Other secondary thrombocytopenia: Secondary | ICD-10-CM | POA: Diagnosis not present

## 2019-05-15 DIAGNOSIS — Z79899 Other long term (current) drug therapy: Secondary | ICD-10-CM | POA: Diagnosis not present

## 2019-05-15 DIAGNOSIS — C3402 Malignant neoplasm of left main bronchus: Secondary | ICD-10-CM

## 2019-05-15 DIAGNOSIS — Z006 Encounter for examination for normal comparison and control in clinical research program: Secondary | ICD-10-CM

## 2019-05-15 DIAGNOSIS — Z5111 Encounter for antineoplastic chemotherapy: Secondary | ICD-10-CM | POA: Insufficient documentation

## 2019-05-15 DIAGNOSIS — R918 Other nonspecific abnormal finding of lung field: Secondary | ICD-10-CM | POA: Diagnosis not present

## 2019-05-15 DIAGNOSIS — M7989 Other specified soft tissue disorders: Secondary | ICD-10-CM

## 2019-05-15 DIAGNOSIS — Z95828 Presence of other vascular implants and grafts: Secondary | ICD-10-CM

## 2019-05-15 DIAGNOSIS — I712 Thoracic aortic aneurysm, without rupture: Secondary | ICD-10-CM | POA: Diagnosis not present

## 2019-05-15 DIAGNOSIS — C3490 Malignant neoplasm of unspecified part of unspecified bronchus or lung: Secondary | ICD-10-CM

## 2019-05-15 LAB — COMPREHENSIVE METABOLIC PANEL
ALT: 16 U/L (ref 0–44)
AST: 22 U/L (ref 15–41)
Albumin: 4 g/dL (ref 3.5–5.0)
Alkaline Phosphatase: 42 U/L (ref 38–126)
Anion gap: 9 (ref 5–15)
BUN: 24 mg/dL — ABNORMAL HIGH (ref 6–20)
CO2: 24 mmol/L (ref 22–32)
Calcium: 9 mg/dL (ref 8.9–10.3)
Chloride: 102 mmol/L (ref 98–111)
Creatinine, Ser: 1.12 mg/dL (ref 0.61–1.24)
GFR calc Af Amer: >60 mL/min (ref >60)
GFR calc non Af Amer: >60 mL/min (ref >60)
Glucose, Bld: 162 mg/dL — ABNORMAL HIGH (ref 70–99)
Potassium: 3.9 mmol/L (ref 3.5–5.1)
Sodium: 135 mmol/L (ref 135–145)
Total Bilirubin: 0.6 mg/dL (ref 0.3–1.2)
Total Protein: 7.1 g/dL (ref 6.5–8.1)

## 2019-05-15 LAB — URINALYSIS, COMPLETE (UACMP) WITH MICROSCOPIC
Bacteria, UA: NONE SEEN
Bilirubin Urine: NEGATIVE
Glucose, UA: NEGATIVE mg/dL
Hgb urine dipstick: NEGATIVE
Ketones, ur: NEGATIVE mg/dL
Leukocytes,Ua: NEGATIVE
Nitrite: NEGATIVE
Protein, ur: NEGATIVE mg/dL
Specific Gravity, Urine: 1.016 (ref 1.005–1.030)
Squamous Epithelial / HPF: NONE SEEN (ref 0–5)
pH: 5 (ref 5.0–8.0)

## 2019-05-15 LAB — CBC WITH DIFFERENTIAL/PLATELET
Abs Immature Granulocytes: 0.02 10*3/uL (ref 0.00–0.07)
Basophils Absolute: 0 10*3/uL (ref 0.0–0.1)
Basophils Relative: 1 %
Eosinophils Absolute: 0.2 10*3/uL (ref 0.0–0.5)
Eosinophils Relative: 5 %
HCT: 42.2 % (ref 39.0–52.0)
Hemoglobin: 14.2 g/dL (ref 13.0–17.0)
Immature Granulocytes: 0 %
Lymphocytes Relative: 31 %
Lymphs Abs: 1.4 10*3/uL (ref 0.7–4.0)
MCH: 31.3 pg (ref 26.0–34.0)
MCHC: 33.6 g/dL (ref 30.0–36.0)
MCV: 93 fL (ref 80.0–100.0)
Monocytes Absolute: 0.5 10*3/uL (ref 0.1–1.0)
Monocytes Relative: 11 %
Neutro Abs: 2.5 10*3/uL (ref 1.7–7.7)
Neutrophils Relative %: 52 %
Platelets: 134 10*3/uL — ABNORMAL LOW (ref 150–400)
RBC: 4.54 MIL/uL (ref 4.22–5.81)
RDW: 12 % (ref 11.5–15.5)
WBC: 4.7 10*3/uL (ref 4.0–10.5)
nRBC: 0 % (ref 0.0–0.2)

## 2019-05-15 LAB — MAGNESIUM: Magnesium: 2 mg/dL (ref 1.7–2.4)

## 2019-05-15 MED ORDER — SODIUM CHLORIDE 0.9 % IV SOLN
500.0000 mg/m2 | Freq: Once | INTRAVENOUS | Status: AC
  Administered 2019-05-15: 1050 mg via INTRAVENOUS
  Filled 2019-05-15: qty 42

## 2019-05-15 MED ORDER — SODIUM CHLORIDE 0.9 % IV SOLN
Freq: Once | INTRAVENOUS | Status: AC
  Administered 2019-05-15: 14:00:00 via INTRAVENOUS
  Filled 2019-05-15: qty 4

## 2019-05-15 MED ORDER — HEPARIN SOD (PORK) LOCK FLUSH 100 UNIT/ML IV SOLN
500.0000 [IU] | Freq: Once | INTRAVENOUS | Status: AC | PRN
  Administered 2019-05-15: 500 [IU]
  Filled 2019-05-15: qty 5

## 2019-05-15 MED ORDER — FOLIC ACID 1 MG PO TABS: 1.0000 mg | ORAL_TABLET | Freq: Every day | ORAL | 3 refills | Status: DC

## 2019-05-15 MED ORDER — SODIUM CHLORIDE 0.9 % IV SOLN
Freq: Once | INTRAVENOUS | Status: AC
  Administered 2019-05-15: 14:00:00 via INTRAVENOUS
  Filled 2019-05-15: qty 250

## 2019-05-15 MED ORDER — SODIUM CHLORIDE 0.9% FLUSH
10.0000 mL | Freq: Once | INTRAVENOUS | Status: AC
  Administered 2019-05-15: 10 mL via INTRAVENOUS
  Filled 2019-05-15: qty 10

## 2019-05-15 MED ORDER — CYANOCOBALAMIN 1000 MCG/ML IJ SOLN
1000.0000 ug | Freq: Once | INTRAMUSCULAR | Status: AC
  Administered 2019-05-15: 1000 ug via INTRAMUSCULAR
  Filled 2019-05-15: qty 1

## 2019-05-15 NOTE — Research (Signed)
05/15/2019 Patient into cancer center for his cycle #639 visit assessment on the [employer] research study. Patient visit with Dr. Rogue Bussing was completed remotely today. Dr. Rogue Bussing reviewed his recent CT scans with him and patient states that it's nothing big at all to worry about. He states he gets these little spots occasionally and then they just go away. Copy of his CT scan provided to the patient. He reports he has no new adverse event/concerns since he last spoke with MD, and all are pretty much the same as they were and sporadic. Patient reports he feels that the pain in his upper right thigh may be related to nerve type pain that it feels more like cramps than anything.  Reports continued mild edema in his BLE, mostly in his ankles, and verifies he is still taking both Lasix and Spironolactone daily to control this. He reports no changes the odd itching in his palms, the bottoms of his feet sporadically and in his right upper inner arm, and states this is relieved with use of generic skin lotion as needed. Patient reports his fatigue has resolved since not receiving Alimta. 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 Reports it's very hot some days and it takes his energy from him. He was encouraged to call the research office if he has any questions or concerns prior to his next visit. All labs were reviewed with his infusion nurse and within normal limits per the protocol for his treatment today  Current and ongoing adverse events with grade and attribution are as follows:   Adverse Event Log Study/Protocol: Mitchell Mosley S130 Cycle: 636 Event Grade Onset Date Resolved Date Drug Name Pemetrexed Attribution Treatment Comments  Hyponatremia Grade 1 02/20/2019 10/17/18 03/13/2019 11/07/18  Unlikely  Sodium now 135mg /dL  Cough Grade 1 09/19/18 10/17/18  Unlikely    Nasal congestion Grade 1 09/19/18 10/17/18  Unlikely    Elevated serum  creatinine Grade 1 10/17/18 06/16/18 07/25/18 11/07/18 07/04/18 09/05/18  Possible  Increase fluids   Edema (mild) Grade 1  12/06/2017   Possible  Trace in right and 1+ edema in left ankle  Fatigue Grade1  01/14/16 03/04/2019  Possible    Tendonitis Grade 1  09/21/16   Unrelated  Meloxicam  Hypocalcemia Grade 1 05/26/18 06/16/18  Possible  Resolved, Calcium level WNL  Hyperglycemia Grade 1 05/26/18 11/09/15  09/08/16  Unlikely  Non-fasting glucose was 123mg /dL  Thrombocytopenia Grade 1 09/26/18 07/04/18 05/26/18 12/06/17 08/02/17 11/27/18 07/25/18 06/16/18 08/02/17  08/23/17  Possible  Platelet count 160,000 today  Nausea Grade 1 11/16/17 11/17/17  Unrelated  R/T antibiotic  Abrasions on Right knuckles Grade 1 02/03/18 02/28/18  Unrelated  r/t Dog walking accident  Pruritis Grade 1  04/19/17   Possible Uses generic lotion with relief deep itching under the skin of palms, feet & right upper arm   Elevated cholesterol Grade 1 03/15/14   Unrelated  No ongoing monitoring available  Valvular Heart Disease Grade 1 10/19/13   Unrelated      Erectile Dysfunction  Grade 2 12/11/15   Possible Cialis   Skin hyperpigmentation Grade 1 07/08/15   Unrelated  Medial aspect of feel bil  Aneurysm of Ascending Aorta Grade 1 10/14/13   Unrelated  mentioned on CT scans  Possible arthritis of Left Knee Grade 1 05/06/15   Unrelated    Elevated BUN Grade 1 09/26/18 07/25/18 10/17/18   08/18/18  Possible  05/15/2019 2:29 PM

## 2019-05-15 NOTE — Assessment & Plan Note (Addendum)
#   Adenocarcinoma the lung; stage IV; June  2020-CT scan chest/a/p-shows no evidence of metastatic disease; 59mm -LLL/ new-likely inflammatory.  Stable right upper lobe 3 mm nodule.  Clinically stable.  #Continue Alimta maintenance.Labs today reviewed;  acceptable for treatment today.  Mildly low platelets at 136.  #Intermittent bilateral lower extremity swelling-stable continue Lasix as needed.  #Chronic mild joint pain -stable.   # left thigh- discomfort-resolved.  # DISPOSITION:  # Treatment/labs as planned.  # follow up on July 7th- MD/labs-cbc/cmp; Alimta- Dr.B

## 2019-05-15 NOTE — Research (Signed)
Research nurse to see patient.

## 2019-05-15 NOTE — Progress Notes (Signed)
I connected with Mitchell Mosley on 05/15/2019 at 10:45 AM EDT by video enabled telemedicine visit and verified that I am speaking with the correct person using two identifiers.  I discussed the limitations, risks, security and privacy concerns of performing an evaluation and management service by telemedicine and the availability of in-person appointments. I also discussed with the patient that there may be a patient responsible charge related to this service. The patient expressed understanding and agreed to proceed.    Other persons participating in the visit and their role in the encounter: RN medical reconciliation Patient's location: Home Provider's location: Office  Oncology History Overview Note  # 2012- METASTATIC ADENO CA of LEFT LUNG [acinar pattern] s/p MED LN Bx [NEG- EGFR/K-ras/ALK mutation];[name]; [name]; CT JULY 2016- NED; JAN 2018- NED  # Thoracic Aneurysm [4cm- stable]  ----------------------------------------------------------    DIAGNOSIS: [2012 ] Adenocarcinoma lung  STAGE: 4       ;GOALS: Palliative  CURRENT/MOST RECENT THERAPY '[ ]'  Alimta maintenance    Cancer of hilus of left lung ([place])  07/20/2016 Initial Diagnosis   Cancer of hilus of left lung ([place])      Chief Complaint: Lung cancer   History of present illness:Mitchell Mosley 58 y.o.  male with history of  pleasant patient above history of metastatic adenocarcinoma the lung currently on maintenance Alimta is here for follow-up/review results of the CT scan.  Patient denies any worsening shortness of breath or cough.  Appetite is good.  Weight loss.  Continues to have intermittent swelling in the legs not any worse.  Chronic mild joint pains.  Not any worse.  Observation/objective: Pending  Assessment and plan: Cancer of hilus of left lung ([place]) # Adenocarcinoma the lung; stage IV; June  2020-CT  scan chest/a/p-shows no evidence of metastatic disease; 59m -LLL/ new-likely inflammatory.  Stable right upper lobe 3 mm nodule.  Clinically stable.  #Continue Alimta maintenance.Labs today reviewed;  acceptable for treatment today.  Mildly low platelets at 136.  #Intermittent bilateral lower extremity swelling-stable continue Lasix as needed.  #Chronic mild joint pain -stable.   # left thigh- discomfort-resolved.  # DISPOSITION:  # Treatment/labs as planned.  # follow up on July 7th- MD/labs-cbc/cmp; Alimta- Dr.B  Follow-up instructions:  I discussed the assessment and treatment plan with the patient.  The patient was provided an opportunity to ask questions and all were answered.  The patient agreed with the plan and demonstrated understanding of instructions.  The patient was advised to call back or seek an in person evaluation if the symptoms worsen or if the condition fails to improve as anticipated.  Dr. GCharlaine DaltonCHCC at [facility]6/13/2020 6:11 AM

## 2019-06-01 DIAGNOSIS — R609 Edema, unspecified: Secondary | ICD-10-CM | POA: Diagnosis not present

## 2019-06-04 ENCOUNTER — Other Ambulatory Visit: Payer: Self-pay

## 2019-06-08 ENCOUNTER — Other Ambulatory Visit: Payer: Self-pay

## 2019-06-08 ENCOUNTER — Telehealth: Payer: Self-pay | Admitting: Internal Medicine

## 2019-06-08 ENCOUNTER — Other Ambulatory Visit: Payer: Self-pay | Admitting: *Deleted

## 2019-06-08 ENCOUNTER — Encounter: Payer: Self-pay | Admitting: Internal Medicine

## 2019-06-08 ENCOUNTER — Telehealth: Payer: Self-pay | Admitting: *Deleted

## 2019-06-08 ENCOUNTER — Inpatient Hospital Stay: Payer: BC Managed Care – PPO

## 2019-06-08 ENCOUNTER — Inpatient Hospital Stay: Payer: BC Managed Care – PPO | Attending: Internal Medicine | Admitting: Internal Medicine

## 2019-06-08 DIAGNOSIS — Z5112 Encounter for antineoplastic immunotherapy: Secondary | ICD-10-CM | POA: Insufficient documentation

## 2019-06-08 DIAGNOSIS — M7989 Other specified soft tissue disorders: Secondary | ICD-10-CM | POA: Insufficient documentation

## 2019-06-08 DIAGNOSIS — C3402 Malignant neoplasm of left main bronchus: Secondary | ICD-10-CM

## 2019-06-08 DIAGNOSIS — Z833 Family history of diabetes mellitus: Secondary | ICD-10-CM | POA: Insufficient documentation

## 2019-06-08 DIAGNOSIS — R05 Cough: Secondary | ICD-10-CM

## 2019-06-08 DIAGNOSIS — R059 Cough, unspecified: Secondary | ICD-10-CM

## 2019-06-08 DIAGNOSIS — Z79899 Other long term (current) drug therapy: Secondary | ICD-10-CM | POA: Insufficient documentation

## 2019-06-08 DIAGNOSIS — Z880 Allergy status to penicillin: Secondary | ICD-10-CM | POA: Insufficient documentation

## 2019-06-08 DIAGNOSIS — M2550 Pain in unspecified joint: Secondary | ICD-10-CM | POA: Insufficient documentation

## 2019-06-08 DIAGNOSIS — Z006 Encounter for examination for normal comparison and control in clinical research program: Secondary | ICD-10-CM | POA: Insufficient documentation

## 2019-06-08 DIAGNOSIS — R600 Localized edema: Secondary | ICD-10-CM | POA: Insufficient documentation

## 2019-06-08 DIAGNOSIS — Z806 Family history of leukemia: Secondary | ICD-10-CM | POA: Insufficient documentation

## 2019-06-08 NOTE — Telephone Encounter (Signed)
Patient will obtain covid testing today at 11am. apts chg to dox and lab and chemo apts cnl per md order

## 2019-06-08 NOTE — Progress Notes (Signed)
I connected with Mitchell Mosley on 06/08/19 at  1:00 PM EDT by video enabled telemedicine visit and verified that I am speaking with the correct person using two identifiers.  I discussed the limitations, risks, security and privacy concerns of performing an evaluation and management service by telemedicine and the availability of in-person appointments. I also discussed with the patient that there may be a patient responsible charge related to this service. The patient expressed understanding and agreed to proceed.    Other persons participating in the visit and their role in the encounter: RN/medical reconciliation  Patient's location: Home Provider's location: Office  Oncology History Overview Note  # 2012- METASTATIC ADENO CA of LEFT LUNG [acinar pattern] s/p MED LN Bx [NEG- EGFR/K-ras/ALK mutation];[name]; [name]; CT JULY 2016- NED; JAN 2018- NED  # Thoracic Aneurysm [4cm- stable]  ----------------------------------------------------------    DIAGNOSIS: [2012 ] Adenocarcinoma lung  STAGE: 4       ;GOALS: Palliative  CURRENT/MOST RECENT THERAPY _0  Alimta maintenance    Cancer of hilus of left lung ([place])  07/20/2016 Initial Diagnosis   Cancer of hilus of left lung ([place])      Chief Complaint: Lung cancer   History of present illness:Mitchell Mosley 58 y.o.  male with history of metastatic adenocarcinoma the lung NED currently on maintenance Alimta is here for follow-up.  Patient states that he is having dry cough for the last 2 days.  Also admits to runny nose this morning.  Denies any sore throat or fevers or chills.  He denies any high risk exposure to COVID-19.  Continues to have chronic swelling in the legs not any worse.  Appetite is good.  No weight loss.  No headaches.  Chronic mild joint pains.  Observation/objective: None  Assessment and plan: Cancer of hilus  of left lung ([place]) # Adenocarcinoma the lung; stage IV; June  2020-CT scan chest/a/p-shows no evidence of metastatic disease; 2m -LLL/ new-likely inflammatory.  Stable right upper lobe 3 mm nodule.  Clinically stable.  #Hold Alimta today; see discussion below.  #Cough/runny nose-suggestive URI; however given concerns of COVID-19 pandemic; however testing was done.  Results pending.  Patient aware that further work-up/results based upon results of above work-up/   #Intermittent bilateral lower extremity swelling stable.  Continue Lasix as needed.  #Chronic mild joint pain -stable  #Discussed with clinical trials staff.  # DISPOSITION:  # Follow up TBD- Dr.B  Follow-up instructions:  I discussed the assessment and treatment plan with the patient.  The patient was provided an opportunity to ask questions and all were answered.  The patient agreed with the plan and demonstrated understanding of instructions.  The patient was advised to call back or seek an in person evaluation if the symptoms worsen or if the condition fails to improve as anticipated.  Dr. GCharlaine DaltonCVinegar Bendat [facility]7/05/2019 8:23 PM

## 2019-06-08 NOTE — Telephone Encounter (Signed)
Mitchell Mosley, clinical trial pt c/o cough "cold"; no fevers. No known exposure to Covid.  H- please order Covid for today.   Please change the today's visit to DOXIMITY at 1:00 pm; cancel chemo/labs for today.    Thanks GB

## 2019-06-08 NOTE — Assessment & Plan Note (Addendum)
#   Adenocarcinoma the lung; stage IV; June  2020-CT scan chest/a/p-shows no evidence of metastatic disease; 31mm -LLL/ new-likely inflammatory.  Stable right upper lobe 3 mm nodule.  Clinically stable.  #Hold Alimta today; see discussion below.  #Cough/runny nose-suggestive URI; however given concerns of COVID-19 pandemic; however testing was done.  Results pending.  Patient aware that further work-up/results based upon results of above work-up/   #Intermittent bilateral lower extremity swelling stable.  Continue Lasix as needed.  #Chronic mild joint pain -stable  #Discussed with clinical trials staff.  # DISPOSITION:  # Follow up TBD- Dr.B

## 2019-06-08 NOTE — Telephone Encounter (Signed)
Received t/c from patient Mitchell Mosley this morning reporting new onset of cough. Patient states he began experiencing a dry/non-productive cough yesterday and states it "feels like a cold is coming." He denies fever, achiness, headache, sore throat, runny nose or nasal congestion. He does state that his chest feels like it may be a little congested. He does not want to come to the clinic or take his Alimta today. Informed Mitchell Mosley that he will likely need to be tested for COVID-19 and he is OK with this. 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 Dr. Rogue Bussing informed of patient's new symptoms and states patient is not to come in to the clinic this afternoon as originally scheduled. Mitchell Mosley appointment will be changed to a video visit and Dr. Rogue Bussing states he will order COVID-19 testing as well as a chest x-ray for the patient. T/C back to Mitchell Mosley to instruct not to come to the clinic today, but that Dr. Rogue Bussing will call him for a video visit around 1:00 and give him instructions about when & where to go for a chest x-ray and COVID-19 testing. Patient is in agreement with this plan. Will cancel his Alimta for today. Yolande Jolly, BSN, MHA, OCN 06/08/2019 9:31 AM  Mitchell Mosley was tested for COVID-19 at the [facility] drive through tent this morning. Will watch for results and reschedule patient's treatment accordingly. Yolande Jolly, BSN, MHA, OCN 06/08/2019 1:50 AM

## 2019-06-09 ENCOUNTER — Ambulatory Visit: Payer: BC Managed Care – PPO

## 2019-06-09 ENCOUNTER — Other Ambulatory Visit: Payer: BC Managed Care – PPO

## 2019-06-09 ENCOUNTER — Ambulatory Visit: Payer: BC Managed Care – PPO | Admitting: Internal Medicine

## 2019-06-10 ENCOUNTER — Telehealth: Payer: Self-pay | Admitting: *Deleted

## 2019-06-12 NOTE — Telephone Encounter (Signed)
Phone text message sent to patient informing that we do not yet have his COVID-19 test results, but that we will notify him as soon as we receive them. Patient acknowledged and reports no one has called him with results yet. Yolande Jolly, BSN, MHA, OCN 06/10/2019 5:58 PM

## 2019-06-13 LAB — NOVEL CORONAVIRUS, NAA: SARS-CoV-2, NAA: NOT DETECTED

## 2019-06-25 ENCOUNTER — Other Ambulatory Visit: Payer: Self-pay

## 2019-06-26 ENCOUNTER — Inpatient Hospital Stay: Payer: BC Managed Care – PPO

## 2019-06-26 ENCOUNTER — Inpatient Hospital Stay (HOSPITAL_BASED_OUTPATIENT_CLINIC_OR_DEPARTMENT_OTHER): Payer: BC Managed Care – PPO | Admitting: Internal Medicine

## 2019-06-26 ENCOUNTER — Encounter: Payer: Self-pay | Admitting: *Deleted

## 2019-06-26 ENCOUNTER — Other Ambulatory Visit: Payer: Self-pay

## 2019-06-26 ENCOUNTER — Encounter: Payer: Self-pay | Admitting: Internal Medicine

## 2019-06-26 DIAGNOSIS — Z95828 Presence of other vascular implants and grafts: Secondary | ICD-10-CM

## 2019-06-26 DIAGNOSIS — R600 Localized edema: Secondary | ICD-10-CM

## 2019-06-26 DIAGNOSIS — C3402 Malignant neoplasm of left main bronchus: Secondary | ICD-10-CM

## 2019-06-26 DIAGNOSIS — Z006 Encounter for examination for normal comparison and control in clinical research program: Secondary | ICD-10-CM

## 2019-06-26 DIAGNOSIS — M2550 Pain in unspecified joint: Secondary | ICD-10-CM | POA: Diagnosis not present

## 2019-06-26 DIAGNOSIS — Z880 Allergy status to penicillin: Secondary | ICD-10-CM

## 2019-06-26 DIAGNOSIS — Z833 Family history of diabetes mellitus: Secondary | ICD-10-CM | POA: Diagnosis not present

## 2019-06-26 DIAGNOSIS — Z79899 Other long term (current) drug therapy: Secondary | ICD-10-CM | POA: Diagnosis not present

## 2019-06-26 DIAGNOSIS — Z806 Family history of leukemia: Secondary | ICD-10-CM | POA: Diagnosis not present

## 2019-06-26 DIAGNOSIS — M7989 Other specified soft tissue disorders: Secondary | ICD-10-CM | POA: Diagnosis not present

## 2019-06-26 DIAGNOSIS — Z5112 Encounter for antineoplastic immunotherapy: Secondary | ICD-10-CM | POA: Diagnosis not present

## 2019-06-26 LAB — COMPREHENSIVE METABOLIC PANEL
ALT: 19 U/L (ref 0–44)
AST: 21 U/L (ref 15–41)
Albumin: 3.8 g/dL (ref 3.5–5.0)
Alkaline Phosphatase: 41 U/L (ref 38–126)
Anion gap: 9 (ref 5–15)
BUN: 22 mg/dL — ABNORMAL HIGH (ref 6–20)
CO2: 25 mmol/L (ref 22–32)
Calcium: 9 mg/dL (ref 8.9–10.3)
Chloride: 102 mmol/L (ref 98–111)
Creatinine, Ser: 1.06 mg/dL (ref 0.61–1.24)
GFR calc Af Amer: >60 mL/min (ref >60)
GFR calc non Af Amer: >60 mL/min (ref >60)
Glucose, Bld: 128 mg/dL — ABNORMAL HIGH (ref 70–99)
Potassium: 3.8 mmol/L (ref 3.5–5.1)
Sodium: 136 mmol/L (ref 135–145)
Total Bilirubin: 0.7 mg/dL (ref 0.3–1.2)
Total Protein: 7.1 g/dL (ref 6.5–8.1)

## 2019-06-26 LAB — CBC WITH DIFFERENTIAL/PLATELET
Abs Immature Granulocytes: 0.03 10*3/uL (ref 0.00–0.07)
Basophils Absolute: 0 10*3/uL (ref 0.0–0.1)
Basophils Relative: 1 %
Eosinophils Absolute: 0.1 10*3/uL (ref 0.0–0.5)
Eosinophils Relative: 3 %
HCT: 38.8 % — ABNORMAL LOW (ref 39.0–52.0)
Hemoglobin: 13.3 g/dL (ref 13.0–17.0)
Immature Granulocytes: 1 %
Lymphocytes Relative: 30 %
Lymphs Abs: 1.6 10*3/uL (ref 0.7–4.0)
MCH: 31.7 pg (ref 26.0–34.0)
MCHC: 34.3 g/dL (ref 30.0–36.0)
MCV: 92.6 fL (ref 80.0–100.0)
Monocytes Absolute: 0.5 10*3/uL (ref 0.1–1.0)
Monocytes Relative: 9 %
Neutro Abs: 3.1 10*3/uL (ref 1.7–7.7)
Neutrophils Relative %: 56 %
Platelets: 136 10*3/uL — ABNORMAL LOW (ref 150–400)
RBC: 4.19 MIL/uL — ABNORMAL LOW (ref 4.22–5.81)
RDW: 13.1 % (ref 11.5–15.5)
WBC: 5.4 10*3/uL (ref 4.0–10.5)
nRBC: 0 % (ref 0.0–0.2)

## 2019-06-26 MED ORDER — SODIUM CHLORIDE 0.9 % IV SOLN
Freq: Once | INTRAVENOUS | Status: AC
  Administered 2019-06-26: 15:00:00 via INTRAVENOUS
  Filled 2019-06-26: qty 4

## 2019-06-26 MED ORDER — SODIUM CHLORIDE 0.9 % IV SOLN
500.0000 mg/m2 | Freq: Once | INTRAVENOUS | Status: AC
  Administered 2019-06-26: 1050 mg via INTRAVENOUS
  Filled 2019-06-26: qty 42

## 2019-06-26 MED ORDER — HEPARIN SOD (PORK) LOCK FLUSH 100 UNIT/ML IV SOLN
500.0000 [IU] | Freq: Once | INTRAVENOUS | Status: AC
  Administered 2019-06-26: 500 [IU] via INTRAVENOUS
  Filled 2019-06-26: qty 5

## 2019-06-26 MED ORDER — SODIUM CHLORIDE 0.9% FLUSH
10.0000 mL | Freq: Once | INTRAVENOUS | Status: AC
  Administered 2019-06-26: 10 mL via INTRAVENOUS
  Filled 2019-06-26: qty 10

## 2019-06-26 MED ORDER — SODIUM CHLORIDE 0.9 % IV SOLN
Freq: Once | INTRAVENOUS | Status: AC
  Administered 2019-06-26: 15:00:00 via INTRAVENOUS
  Filled 2019-06-26: qty 250

## 2019-06-26 NOTE — Progress Notes (Signed)
[name] OFFICE PROGRESS NOTE  Patient Care Team: [name], MD as PCP - General [facility] Medicine)  Cancer Staging No matching staging information was found for the patient.   Oncology History Overview Note  # 2012- METASTATIC ADENO CA of LEFT LUNG [acinar pattern] s/p MED LN Bx [NEG- EGFR/K-ras/ALK mutation];[name]; [name]; CT JULY 2016- NED; JAN 2018- NED  # Thoracic Aneurysm [4cm- stable]  ----------------------------------------------------------    DIAGNOSIS: [2012 ] Adenocarcinoma lung  STAGE: 4       ;GOALS: Palliative  CURRENT/MOST RECENT THERAPY '[ ]'  Alimta maintenance    Cancer of hilus of left lung ([place])  07/20/2016 Initial Diagnosis   Cancer of hilus of left lung ([place])       INTERVAL HISTORY:  Mitchell Mosley 58 y.o.  male pleasant patient above history of metastatic adenocarcinoma the lung currently on maintenance Alimta is here for follow-up.  Patient's treatments were interrupted because of recent URI.  Work-up including COVID-19 negative.   Patient is currently back to baseline.  He just returned from his beach trip with his family.  No new shortness of breath or cough.  Chronic swelling in the legs.  Review of Systems  Constitutional: Negative for chills, diaphoresis, fever, malaise/fatigue and weight loss.  HENT: Negative for nosebleeds and sore throat.   Eyes: Negative for double vision.  Respiratory: Negative for hemoptysis, shortness of breath and wheezing.   Cardiovascular: Positive for leg swelling. Negative for chest pain, palpitations and orthopnea.  Gastrointestinal: Negative for abdominal pain, blood in stool, constipation, diarrhea, heartburn, melena, nausea and vomiting.  Genitourinary: Negative for dysuria, frequency and urgency.  Musculoskeletal: Positive for joint pain.  Skin: Negative.  Negative for  itching and rash.  Neurological: Negative for dizziness, tingling, focal weakness, weakness and headaches.  Endo/Heme/Allergies: Does not bruise/bleed easily.  Psychiatric/Behavioral: Negative for depression. The patient is not nervous/anxious and does not have insomnia.       PAST MEDICAL HISTORY :  Past Medical History:  Diagnosis Date  . Allergy   . Lung cancer ([place])   . Mild valvular heart disease Nov. 2014   per 2 D Echocardiogram done for persistent leg edema, monitored by cardiology  . Peripheral edema     PAST SURGICAL HISTORY :   Past Surgical History:  Procedure Laterality Date  . ELBOW SURGERY    . Left anterior thoracotomy with biopsy  March 2012  . PORTACATH PLACEMENT  March 2012  . TONSILLECTOMY      FAMILY HISTORY :   Family History  Problem Relation Age of Onset  . Leukemia Father   . Diabetes Mother     SOCIAL HISTORY:   Social History   Tobacco Use  . Smoking status: Never Smoker  . Smokeless tobacco: Never Used  Substance Use Topics  . Alcohol use: Yes    Alcohol/week: 0.0 standard drinks    Comment: "social drinker"   . Drug use: No    ALLERGIES:  is allergic to penicillins.  MEDICATIONS:  Current Outpatient Medications  Medication Sig Dispense Refill  . folic acid (FOLVITE) 1 MG tablet Take 1 tablet (1 mg total) by mouth daily. 90 tablet 3  . furosemide (LASIX) 20 MG tablet TAKE 1 TABLET (20 MG TOTAL) BY MOUTH DAILY. ONE TABLET DAILY AS NEEDED FOR SWELLING 90 tablet 1  . loratadine (CLARITIN) 10 MG tablet Take 1 tablet (10 mg total) by  mouth daily. (Patient not taking: Reported on 06/08/2019)    . meloxicam (MOBIC) 7.5 MG tablet Take 7.5 mg by mouth daily.     . Multiple Vitamin (MULTIVITAMIN) capsule Take 1 capsule by mouth daily. Reported on 02/24/2016    . Omega-3 Fatty Acids (FISH OIL) 1000 MG CAPS Take 1 capsule by mouth daily. Reported on 02/24/2016    . senna (SENOKOT) 8.6 MG tablet Take 1 tablet by mouth daily. Take as needed around  time of chemotherapy    . spironolactone (ALDACTONE) 25 MG tablet TAKE 1 TABLET BY MOUTH EVERY DAY 90 tablet 3  . tadalafil (ADCIRCA/CIALIS) 20 MG tablet TAKE 1 TABLET BY MOUTH EVERY 2 DAYS AS NEEDED. 6 tablet 11  . vitamin B-12 (CYANOCOBALAMIN) 1000 MCG tablet Take 1,000 mcg by mouth daily.     No current facility-administered medications for this visit.    Facility-Administered Medications Ordered in Other Visits  Medication Dose Route Frequency Provider Last Rate Last Dose  . sodium chloride 0.9 % injection 10 mL  10 mL Intracatheter PRN [name], MD   10 mL at 05/27/15 1543  . sodium chloride 0.9 % injection 10 mL  10 mL Intracatheter PRN [name], MD   10 mL at 06/17/15 1510  . sodium chloride 0.9 % injection 10 mL  10 mL Intravenous PRN [name], MD   10 mL at 07/08/15 1340    PHYSICAL EXAMINATION: ECOG PERFORMANCE STATUS: 0 - Asymptomatic  BP 125/79 (BP Location: Left Arm, Patient Position: Sitting)   Pulse 78   Temp (!) 97.1 F (36.2 C)   Wt 217 lb 3.2 oz (98.5 kg)   BMI 31.16 kg/m   Filed Weights   06/26/19 1403  Weight: 217 lb 3.2 oz (98.5 kg)    Physical Exam  Constitutional: He is oriented to person, place, and time and well-developed, well-nourished, and in no distress.  HENT:  Head: Normocephalic and atraumatic.  Mouth/Throat: Oropharynx is clear and moist. No oropharyngeal exudate.  Eyes: Pupils are equal, round, and reactive to light.  Neck: Normal range of motion. Neck supple.  Cardiovascular: Normal rate and regular rhythm.  Pulmonary/Chest: No respiratory distress. He has no wheezes.  Abdominal: Soft. Bowel sounds are normal. He exhibits no distension and no mass. There is no abdominal tenderness. There is no rebound and no guarding.  Musculoskeletal: Normal range of motion.        General: Edema present. No tenderness.     Comments: Grade 1 swelling in the legs bilaterally.  Neurological: He is alert and oriented to person, place, and  time.  Skin: Skin is warm.  Psychiatric: Affect normal.       LABORATORY DATA:  I have reviewed the data as listed    Component Value Date/Time   NA 136 06/26/2019 1347   NA 138 12/10/2014 1341   K 3.8 06/26/2019 1347   K 3.6 12/10/2014 1341   CL 102 06/26/2019 1347   CL 102 12/10/2014 1341   CO2 25 06/26/2019 1347   CO2 30 12/10/2014 1341   GLUCOSE 128 (H) 06/26/2019 1347   GLUCOSE 160 (H) 12/10/2014 1341   BUN 22 (H) 06/26/2019 1347   BUN 21 (H) 12/10/2014 1341   CREATININE 1.06 06/26/2019 1347   CREATININE 1.10 03/25/2015 1453   CALCIUM 9.0 06/26/2019 1347   CALCIUM 8.4 (L) 12/10/2014 1341   PROT 7.1 06/26/2019 1347   PROT 6.6 12/10/2014 1341   ALBUMIN 3.8 06/26/2019 1347   ALBUMIN 3.4 12/10/2014  1341   AST 21 06/26/2019 1347   AST 17 12/10/2014 1341   ALT 19 06/26/2019 1347   ALT 23 12/10/2014 1341   ALKPHOS 41 06/26/2019 1347   ALKPHOS 50 12/10/2014 1341   BILITOT 0.7 06/26/2019 1347   BILITOT 0.5 12/10/2014 1341   GFRNONAA >60 06/26/2019 1347   GFRNONAA >60 03/25/2015 1453   GFRAA >60 06/26/2019 1347   GFRAA >60 03/25/2015 1453    No results found for: SPEP, UPEP  Lab Results  Component Value Date   WBC 5.4 06/26/2019   NEUTROABS 3.1 06/26/2019   HGB 13.3 06/26/2019   HCT 38.8 (L) 06/26/2019   MCV 92.6 06/26/2019   PLT 136 (L) 06/26/2019      Chemistry      Component Value Date/Time   NA 136 06/26/2019 1347   NA 138 12/10/2014 1341   K 3.8 06/26/2019 1347   K 3.6 12/10/2014 1341   CL 102 06/26/2019 1347   CL 102 12/10/2014 1341   CO2 25 06/26/2019 1347   CO2 30 12/10/2014 1341   BUN 22 (H) 06/26/2019 1347   BUN 21 (H) 12/10/2014 1341   CREATININE 1.06 06/26/2019 1347   CREATININE 1.10 03/25/2015 1453      Component Value Date/Time   CALCIUM 9.0 06/26/2019 1347   CALCIUM 8.4 (L) 12/10/2014 1341   ALKPHOS 41 06/26/2019 1347   ALKPHOS 50 12/10/2014 1341   AST 21 06/26/2019 1347   AST 17 12/10/2014 1341   ALT 19 06/26/2019 1347   ALT  23 12/10/2014 1341   BILITOT 0.7 06/26/2019 1347   BILITOT 0.5 12/10/2014 1341       RADIOGRAPHIC STUDIES: I have personally reviewed the radiological images as listed and agreed with the findings in the report. No results found.   ASSESSMENT & PLAN:  Cancer of hilus of left lung ([place]) # Adenocarcinoma the lung; stage IV; June 6th  2020-CT scan chest/a/p-shows no evidence of metastatic disease; 66m -LLL/ new-likely inflammatory.  Stable right upper lobe 3 mm nodule.  Clinically STABLE.   # Proceed with Alimta today; Labs today reviewed;  acceptable for treatment today-except for mild thrombocytopenia 136..Marland Kitchen Discussed that we will plan to get a scan in 4 to 6 months/October 2020.  #Intermittent bilateral lower extremity swelling stable.  Continue Lasix as needed.  #Chronic mild joint pain -stable  #Discussed with clinical trials staff re: study closure; will check with insurance for approval.  # DISPOSITION:  # Alimta today # follow up in 3 weeks- MD/ labs- cbc/cmp/Alimta/ b12 Dr.B   No orders of the defined types were placed in this encounter.  All questions were answered. The patient knows to call the clinic with any problems, questions or concerns.      GCammie Sickle MD 06/26/2019 2:25 PM

## 2019-06-26 NOTE — Research (Signed)
06/26/2019 Patient into cancer center for his cycle #641 visit assessment on the [employer] research study. Patient was seen and evaluated by Dr. Rogue Bussing in clinic today.  Patient reports he has no new adverse event/concerns since he last spoke with MD, and all are pretty much the same as they were and sporadic. Reports occasional pain in his upper right thigh that feels more like cramps than anything.  Continues to have mild edema in his BLE, mostly in his ankles, and verifies he is still taking both Lasix and Spironolactone daily to control this. He reports no changes the odd itching in his palms, the bottoms of his feet sporadically and in his right upper inner arm, and states this is relieved with use of generic skin lotion as needed. Patient reports his fatigue is mild and lasts just a few days after he receives Alimta infusion. 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 Reports tolerating the heat very well this past week. Mr. Gorum was informed by this research nurse and Dr. Rogue Bussing that we were notified that the [facility] study is going to close at the end of August, and that they will no longer be providing Alimta for him. Dr. Rogue Bussing discussed the benefit of having him continue receiving maintenance Alimta and the patient is willing as long as his insurance will cover it. Some discussion was made regarding whether he should stop taking the Alimta, his quality of life on the drug as he tolerates it very well, and how often CT scans will be performed off study. LuAnn Chrismon was notified to verify insurance coverage for Alimta and we will discuss whether they will cover it and what the patient's co-pay would be in order to ensure the transition from study supplied Investigational  to commercial drug is seamless and patient does not have a gap in his treatment. Mr Hailes is aware that he can call the research office if he has any questions or  concerns prior to his next visit. Labs were reviewed with patient and Dr. Rogue Bussing, and within normal limits per the protocol for his treatment today. Current and ongoing adverse events with grade and attribution are as follows:   Adverse Event Log Study/Protocol: [name] S130 Cycle: 639 Event Grade Onset Date Resolved Date Drug Name Pemetrexed Attribution Treatment Comments  Hyponatremia Grade 1 02/20/2019 10/17/18 03/13/2019 11/07/18  Unlikely  Sodium now 135mg /dL  Cough Grade 1 09/19/18 10/17/18  Unlikely    Nasal congestion Grade 1 09/19/18 10/17/18  Unlikely    Elevated serum creatinine Grade 1 10/17/18 06/16/18 07/25/18 11/07/18 07/04/18 09/05/18  Possible  Increase fluids   Edema (mild) Grade 1  12/06/2017   Possible  Trace in right and 1+ edema in left ankle  Fatigue Grade1  01/14/16 03/04/2019  Possible    Tendonitis Grade 1  09/21/16   Unrelated  Meloxicam  Hypocalcemia Grade 1 05/26/18 06/16/18  Possible  Resolved, Calcium level WNL  Hyperglycemia Grade 1 05/26/18 11/09/15  09/08/16  Unlikely  Non-fasting glucose was 128mg /dL  Thrombocytopenia Grade 1 05/15/2019 09/26/18 07/04/18 05/26/18 12/06/17 08/02/17  11/27/18 07/25/18 06/16/18 08/02/17  08/23/17  Possible  Platelet count 136,000 today  Nausea Grade 1 11/16/17 11/17/17  Unrelated  R/T antibiotic  Abrasions on Right knuckles Grade 1 02/03/18 02/28/18  Unrelated  r/t Dog walking accident  Pruritis Grade 1  04/19/17   Possible Uses generic lotion with relief deep itching under the  skin of palms, feet & right upper arm   Elevated cholesterol Grade 1 03/15/14   Unrelated  No ongoing monitoring available  Valvular Heart Disease Grade 1 10/19/13   Unrelated      Erectile Dysfunction  Grade 2 12/11/15   Possible Cialis   Skin hyperpigmentation Grade 1 07/08/15   Unrelated  Medial aspect of feel bil  Aneurysm of Ascending Aorta Grade 1 10/14/13   Unrelated  mentioned on CT  scans  Possible arthritis of Left Knee Grade 1 05/06/15   Unrelated    Elevated BUN Grade 1 05/15/2019 09/26/18 07/25/18  10/17/18   08/18/18  Possible    Yolande Jolly, BSN, MHA, OCN 06/26/2019 2:31 PM

## 2019-06-26 NOTE — Assessment & Plan Note (Addendum)
#   Adenocarcinoma the lung; stage IV; June 6th  2020-CT scan chest/a/p-shows no evidence of metastatic disease; 66mm -LLL/ new-likely inflammatory.  Stable right upper lobe 3 mm nodule.  Clinically STABLE.   # Proceed with Alimta today; Labs today reviewed;  acceptable for treatment today-except for mild thrombocytopenia 136.Marland Kitchen  Discussed that we will plan to get a scan in 4 to 6 months/October 2020.  #Intermittent bilateral lower extremity swelling stable.  Continue Lasix as needed.  #Chronic mild joint pain -stable  #Discussed with clinical trials staff re: study closure; will check with insurance for approval.  # DISPOSITION:  # Alimta today # follow up in 3 weeks- MD/ labs- cbc/cmp/Alimta/ b12 Dr.B

## 2019-07-16 ENCOUNTER — Other Ambulatory Visit: Payer: Self-pay

## 2019-07-17 ENCOUNTER — Other Ambulatory Visit: Payer: Self-pay | Admitting: Family Medicine

## 2019-07-17 ENCOUNTER — Other Ambulatory Visit: Payer: Self-pay | Admitting: Internal Medicine

## 2019-07-17 ENCOUNTER — Encounter: Payer: Self-pay | Admitting: *Deleted

## 2019-07-17 ENCOUNTER — Encounter: Payer: Self-pay | Admitting: Internal Medicine

## 2019-07-17 ENCOUNTER — Inpatient Hospital Stay: Payer: BC Managed Care – PPO

## 2019-07-17 ENCOUNTER — Inpatient Hospital Stay (HOSPITAL_BASED_OUTPATIENT_CLINIC_OR_DEPARTMENT_OTHER): Payer: BC Managed Care – PPO | Admitting: Internal Medicine

## 2019-07-17 ENCOUNTER — Inpatient Hospital Stay: Payer: BC Managed Care – PPO | Attending: Internal Medicine

## 2019-07-17 ENCOUNTER — Other Ambulatory Visit: Payer: Self-pay

## 2019-07-17 DIAGNOSIS — C3402 Malignant neoplasm of left main bronchus: Secondary | ICD-10-CM

## 2019-07-17 DIAGNOSIS — Z5111 Encounter for antineoplastic chemotherapy: Secondary | ICD-10-CM | POA: Diagnosis not present

## 2019-07-17 DIAGNOSIS — R918 Other nonspecific abnormal finding of lung field: Secondary | ICD-10-CM | POA: Insufficient documentation

## 2019-07-17 DIAGNOSIS — Z791 Long term (current) use of non-steroidal anti-inflammatories (NSAID): Secondary | ICD-10-CM | POA: Diagnosis not present

## 2019-07-17 DIAGNOSIS — R600 Localized edema: Secondary | ICD-10-CM | POA: Insufficient documentation

## 2019-07-17 DIAGNOSIS — Z006 Encounter for examination for normal comparison and control in clinical research program: Secondary | ICD-10-CM | POA: Insufficient documentation

## 2019-07-17 DIAGNOSIS — M2550 Pain in unspecified joint: Secondary | ICD-10-CM | POA: Insufficient documentation

## 2019-07-17 DIAGNOSIS — R109 Unspecified abdominal pain: Secondary | ICD-10-CM | POA: Insufficient documentation

## 2019-07-17 DIAGNOSIS — Z95828 Presence of other vascular implants and grafts: Secondary | ICD-10-CM

## 2019-07-17 DIAGNOSIS — Z79899 Other long term (current) drug therapy: Secondary | ICD-10-CM | POA: Insufficient documentation

## 2019-07-17 LAB — CBC WITH DIFFERENTIAL/PLATELET
Abs Immature Granulocytes: 0.02 10*3/uL (ref 0.00–0.07)
Basophils Absolute: 0 10*3/uL (ref 0.0–0.1)
Basophils Relative: 0 %
Eosinophils Absolute: 0.1 10*3/uL (ref 0.0–0.5)
Eosinophils Relative: 1 %
HCT: 38.5 % — ABNORMAL LOW (ref 39.0–52.0)
Hemoglobin: 13.4 g/dL (ref 13.0–17.0)
Immature Granulocytes: 0 %
Lymphocytes Relative: 27 %
Lymphs Abs: 1.4 10*3/uL (ref 0.7–4.0)
MCH: 31.9 pg (ref 26.0–34.0)
MCHC: 34.8 g/dL (ref 30.0–36.0)
MCV: 91.7 fL (ref 80.0–100.0)
Monocytes Absolute: 0.5 10*3/uL (ref 0.1–1.0)
Monocytes Relative: 9 %
Neutro Abs: 3.2 10*3/uL (ref 1.7–7.7)
Neutrophils Relative %: 63 %
Platelets: 155 10*3/uL (ref 150–400)
RBC: 4.2 MIL/uL — ABNORMAL LOW (ref 4.22–5.81)
RDW: 13.3 % (ref 11.5–15.5)
WBC: 5.2 10*3/uL (ref 4.0–10.5)
nRBC: 0 % (ref 0.0–0.2)

## 2019-07-17 LAB — COMPREHENSIVE METABOLIC PANEL
ALT: 19 U/L (ref 0–44)
AST: 22 U/L (ref 15–41)
Albumin: 4 g/dL (ref 3.5–5.0)
Alkaline Phosphatase: 47 U/L (ref 38–126)
Anion gap: 8 (ref 5–15)
BUN: 21 mg/dL — ABNORMAL HIGH (ref 6–20)
CO2: 25 mmol/L (ref 22–32)
Calcium: 9.1 mg/dL (ref 8.9–10.3)
Chloride: 100 mmol/L (ref 98–111)
Creatinine, Ser: 0.93 mg/dL (ref 0.61–1.24)
GFR calc Af Amer: >60 mL/min (ref >60)
GFR calc non Af Amer: >60 mL/min (ref >60)
Glucose, Bld: 138 mg/dL — ABNORMAL HIGH (ref 70–99)
Potassium: 3.7 mmol/L (ref 3.5–5.1)
Sodium: 133 mmol/L — ABNORMAL LOW (ref 135–145)
Total Bilirubin: 0.9 mg/dL (ref 0.3–1.2)
Total Protein: 7.3 g/dL (ref 6.5–8.1)

## 2019-07-17 MED ORDER — SODIUM CHLORIDE 0.9 % IV SOLN
Freq: Once | INTRAVENOUS | Status: AC
  Administered 2019-07-17: 14:00:00 via INTRAVENOUS
  Filled 2019-07-17: qty 4

## 2019-07-17 MED ORDER — SODIUM CHLORIDE 0.9% FLUSH
10.0000 mL | Freq: Once | INTRAVENOUS | Status: AC
  Administered 2019-07-17: 11:00:00 10 mL via INTRAVENOUS
  Filled 2019-07-17: qty 10

## 2019-07-17 MED ORDER — HEPARIN SOD (PORK) LOCK FLUSH 100 UNIT/ML IV SOLN
500.0000 [IU] | Freq: Once | INTRAVENOUS | Status: AC | PRN
  Administered 2019-07-17: 15:00:00 500 [IU]

## 2019-07-17 MED ORDER — SODIUM CHLORIDE 0.9 % IV SOLN
Freq: Once | INTRAVENOUS | Status: AC
  Administered 2019-07-17: 14:00:00 via INTRAVENOUS
  Filled 2019-07-17: qty 250

## 2019-07-17 MED ORDER — SODIUM CHLORIDE 0.9 % IV SOLN
500.0000 mg/m2 | Freq: Once | INTRAVENOUS | Status: AC
  Administered 2019-07-17: 1050 mg via INTRAVENOUS
  Filled 2019-07-17: qty 42

## 2019-07-17 NOTE — Progress Notes (Signed)
[place] OFFICE PROGRESS NOTE  Patient Care Team: [name], MD as PCP - General [facility] Medicine)  Cancer Staging No matching staging information was found for the patient.   Oncology History Overview Note  # 2012- METASTATIC ADENO CA of LEFT LUNG [acinar pattern] s/p MED LN Bx [NEG- EGFR/K-ras/ALK mutation];[name]; [name]; CT JULY  2016- NED; JAN 2018- NED;   # AUG 2020-clinical trial closed; continue maintenance Alimta  # Thoracic Aneurysm [4cm- stable]  ----------------------------------------------------------    DIAGNOSIS: [2012 ] Adenocarcinoma lung  STAGE: 4       ;GOALS: Palliative  CURRENT/MOST RECENT THERAPY '[ ]'  Alimta maintenance    Cancer of hilus of left lung ([place])  07/20/2016 Initial Diagnosis   Cancer of hilus of left lung ([place])       INTERVAL HISTORY:  Mitchell Mosley 58 y.o.  male pleasant patient above history of metastatic adenocarcinoma the lung currently on maintenance Alimta is here for follow-up.  Patient notes to have an episode of abdominal discomfort/loose stool 1-2 few days after last episode of chemotherapy.  Currently resolved.  Denies any shortness of breath or cough.  Continues to be physically active.  Review of Systems  Constitutional: Negative for chills, diaphoresis, fever, malaise/fatigue and weight loss.  HENT: Negative for nosebleeds and sore throat.   Eyes: Negative for double vision.  Respiratory: Negative for hemoptysis, shortness of breath and wheezing.   Cardiovascular: Positive for leg swelling. Negative for chest pain, palpitations and orthopnea.  Gastrointestinal: Negative for abdominal pain, blood in stool, constipation, diarrhea, heartburn, melena, nausea and vomiting.  Genitourinary: Negative for dysuria, frequency and urgency.  Musculoskeletal: Positive for joint pain.  Skin: Negative.   Negative for itching and rash.  Neurological: Negative for dizziness, tingling, focal weakness, weakness and headaches.  Endo/Heme/Allergies: Does not bruise/bleed easily.  Psychiatric/Behavioral: Negative for depression. The patient is not nervous/anxious and does not have insomnia.       PAST MEDICAL HISTORY :  Past Medical History:  Diagnosis Date  . Allergy   . Lung cancer ([place])   . Mild valvular heart disease Nov. 2014   per 2 D Echocardiogram done for persistent leg edema, monitored by cardiology  . Peripheral edema     PAST SURGICAL HISTORY :   Past Surgical History:  Procedure Laterality Date  . ELBOW SURGERY    . Left anterior thoracotomy with biopsy  March 2012  . PORTACATH PLACEMENT  March 2012  . TONSILLECTOMY      FAMILY HISTORY :   Family History  Problem Relation Age of Onset  . Leukemia Father   . Diabetes Mother     SOCIAL HISTORY:   Social History   Tobacco Use  . Smoking status: Never Smoker  . Smokeless tobacco: Never Used  Substance Use Topics  . Alcohol use: Yes    Alcohol/week: 0.0 standard drinks    Comment: "social drinker"   . Drug use: No    ALLERGIES:  is allergic to penicillins.  MEDICATIONS:  Current Outpatient Medications  Medication Sig Dispense Refill  . folic acid (FOLVITE) 1 MG tablet Take 1 tablet (1 mg total) by mouth daily. 90 tablet 3  . furosemide (LASIX) 20 MG tablet TAKE 1 TABLET (20 MG TOTAL) BY MOUTH DAILY. ONE TABLET DAILY AS NEEDED FOR SWELLING 90 tablet 1  . meloxicam (MOBIC) 7.5 MG tablet Take 7.5 mg by mouth daily.     Marland Kitchen  Multiple Vitamin (MULTIVITAMIN) capsule Take 1 capsule by mouth daily. Reported on 02/24/2016    . Omega-3 Fatty Acids (FISH OIL) 1000 MG CAPS Take 1 capsule by mouth daily. Reported on 02/24/2016    . senna (SENOKOT) 8.6 MG tablet Take 1 tablet by mouth daily. Take as needed around time of chemotherapy    . spironolactone (ALDACTONE) 25 MG tablet TAKE 1 TABLET BY MOUTH EVERY DAY 90 tablet 3  .  tadalafil (CIALIS) 20 MG tablet TAKE 1 TABLET BY MOUTH EVERY 2 DAYS AS NEEDED. 6 tablet 1  . vitamin B-12 (CYANOCOBALAMIN) 1000 MCG tablet Take 1,000 mcg by mouth daily.    Marland Kitchen loratadine (CLARITIN) 10 MG tablet Take 1 tablet (10 mg total) by mouth daily. (Patient not taking: Reported on 07/17/2019)     No current facility-administered medications for this visit.    Facility-Administered Medications Ordered in Other Visits  Medication Dose Route Frequency Provider Last Rate Last Dose  . sodium chloride 0.9 % injection 10 mL  10 mL Intracatheter PRN [name], MD   10 mL at 05/27/15 1543  . sodium chloride 0.9 % injection 10 mL  10 mL Intracatheter PRN [name], MD   10 mL at 06/17/15 1510  . sodium chloride 0.9 % injection 10 mL  10 mL Intravenous PRN [name], MD   10 mL at 07/08/15 1340    PHYSICAL EXAMINATION: ECOG PERFORMANCE STATUS: 0 - Asymptomatic  BP 130/77 (BP Location: Left Arm, Patient Position: Sitting, Cuff Size: Normal)   Pulse 77   Temp 97.6 F (36.4 C) (Tympanic)   Resp 20   Ht '5\' 10"'  (1.778 m)   Wt 217 lb (98.4 kg)   BMI 31.14 kg/m   Filed Weights   07/17/19 1333  Weight: 217 lb (98.4 kg)    Physical Exam  Constitutional: He is oriented to person, place, and time and well-developed, well-nourished, and in no distress.  HENT:  Head: Normocephalic and atraumatic.  Mouth/Throat: Oropharynx is clear and moist. No oropharyngeal exudate.  Eyes: Pupils are equal, round, and reactive to light.  Neck: Normal range of motion. Neck supple.  Cardiovascular: Normal rate and regular rhythm.  Pulmonary/Chest: No respiratory distress. He has no wheezes.  Abdominal: Soft. Bowel sounds are normal. He exhibits no distension and no mass. There is no abdominal tenderness. There is no rebound and no guarding.  Musculoskeletal: Normal range of motion.        General: Edema present. No tenderness.     Comments: Grade 1 swelling in the legs bilaterally.   Neurological: He is alert and oriented to person, place, and time.  Skin: Skin is warm.  Psychiatric: Affect normal.     LABORATORY DATA:  I have reviewed the data as listed    Component Value Date/Time   NA 133 (L) 07/17/2019 1057   NA 138 12/10/2014 1341   K 3.7 07/17/2019 1057   K 3.6 12/10/2014 1341   CL 100 07/17/2019 1057   CL 102 12/10/2014 1341   CO2 25 07/17/2019 1057   CO2 30 12/10/2014 1341   GLUCOSE 138 (H) 07/17/2019 1057   GLUCOSE 160 (H) 12/10/2014 1341   BUN 21 (H) 07/17/2019 1057   BUN 21 (H) 12/10/2014 1341   CREATININE 0.93 07/17/2019 1057   CREATININE 1.10 03/25/2015 1453   CALCIUM 9.1 07/17/2019 1057   CALCIUM 8.4 (L) 12/10/2014 1341   PROT 7.3 07/17/2019 1057   PROT 6.6 12/10/2014 1341   ALBUMIN 4.0 07/17/2019 1057  ALBUMIN 3.4 12/10/2014 1341   AST 22 07/17/2019 1057   AST 17 12/10/2014 1341   ALT 19 07/17/2019 1057   ALT 23 12/10/2014 1341   ALKPHOS 47 07/17/2019 1057   ALKPHOS 50 12/10/2014 1341   BILITOT 0.9 07/17/2019 1057   BILITOT 0.5 12/10/2014 1341   GFRNONAA >60 07/17/2019 1057   GFRNONAA >60 03/25/2015 1453   GFRAA >60 07/17/2019 1057   GFRAA >60 03/25/2015 1453    No results found for: SPEP, UPEP  Lab Results  Component Value Date   WBC 5.2 07/17/2019   NEUTROABS 3.2 07/17/2019   HGB 13.4 07/17/2019   HCT 38.5 (L) 07/17/2019   MCV 91.7 07/17/2019   PLT 155 07/17/2019      Chemistry      Component Value Date/Time   NA 133 (L) 07/17/2019 1057   NA 138 12/10/2014 1341   K 3.7 07/17/2019 1057   K 3.6 12/10/2014 1341   CL 100 07/17/2019 1057   CL 102 12/10/2014 1341   CO2 25 07/17/2019 1057   CO2 30 12/10/2014 1341   BUN 21 (H) 07/17/2019 1057   BUN 21 (H) 12/10/2014 1341   CREATININE 0.93 07/17/2019 1057   CREATININE 1.10 03/25/2015 1453      Component Value Date/Time   CALCIUM 9.1 07/17/2019 1057   CALCIUM 8.4 (L) 12/10/2014 1341   ALKPHOS 47 07/17/2019 1057   ALKPHOS 50 12/10/2014 1341   AST 22 07/17/2019  1057   AST 17 12/10/2014 1341   ALT 19 07/17/2019 1057   ALT 23 12/10/2014 1341   BILITOT 0.9 07/17/2019 1057   BILITOT 0.5 12/10/2014 1341       RADIOGRAPHIC STUDIES: I have personally reviewed the radiological images as listed and agreed with the findings in the report. No results found.   ASSESSMENT & PLAN:  Cancer of hilus of left lung ([place]) # Adenocarcinoma the lung; stage IV; June 6th  2020-CT scan chest/a/p-shows no evidence of metastatic disease; 53m -LLL/ new-likely inflammatory.  Stable right upper lobe 3 mm nodule.  Clinically STABLE.   # Proceed with Alimta today; Labs today reviewed;  acceptable for treatment today.  Plan to get a CT scan again in October 2020.  # abdominal discomfort-1-2 loose stools-question etiology currently improved.  #Chronic mild joint pain -stable  # DISPOSITION:  # Alimta today # follow up in 3 weeks- MD/ labs- cbc/cmp/Alimta Dr.B   No orders of the defined types were placed in this encounter.  All questions were answered. The patient knows to call the clinic with any problems, questions or concerns.      GCammie Sickle MD 07/19/2019 4:52 PM

## 2019-07-17 NOTE — Telephone Encounter (Signed)
Patient hasn't been seen in two years in the clinic. I am deferring this refill to his PCP.

## 2019-07-17 NOTE — Assessment & Plan Note (Addendum)
#   Adenocarcinoma the lung; stage IV; June 6th  2020-CT scan chest/a/p-shows no evidence of metastatic disease; 11mm -LLL/ new-likely inflammatory.  Stable right upper lobe 3 mm nodule.  Clinically STABLE.   # Proceed with Alimta today; Labs today reviewed;  acceptable for treatment today.  Plan to get a CT scan again in October 2020.  # abdominal discomfort-1-2 loose stools-question etiology currently improved.  #Chronic mild joint pain -stable  # DISPOSITION:  # Alimta today # follow up in 3 weeks- MD/ labs- cbc/cmp/Alimta Dr.B

## 2019-07-17 NOTE — Research (Signed)
07/17/2019 Patient Mitchell Mosley was contacted by phone after he presented cancer center for his cycle #642 labs, but prior to MD visit assessment & infusion on the [employer] research study. Patient will be seen and evaluated by Dr. Rogue Bussing in clinic today.  Patient reports he has no new adverse event/concerns since he last spoke with MD with the exception of some mild GI upset since his last Alimta infusion.States he has had more stomach cramping and bowels have moved more, but denies experiencing any diarrhea. States he feels he is just readjusting to the Alimta infusions after having been on a break for a few months. He denies any GI distress from this new AE. Reports continued  occasional cramping in his upper right thigh and mild edema in his BLE, mostly in his ankles, and verifies he is still taking both Lasix and Spironolactone daily to control this. He reports no changes the odd itching in his palms, the bottoms of his feet sporadically and in his right upper inner arm, and states this is relieved with use of generic skin lotion as needed. Patient reports his fatigue is mild and lasts just a few days after he receives Alimta infusion. 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 Reports tolerating the heat very well this past week. Mitchell Mosley was previously informed by this research nurse and Dr. Rogue Bussing that we were notified that the [facility] S130 study is going to close at the end of August, and Dr. Rogue Bussing also discussed the benefit of having him continue receiving maintenance Alimta. Mitchell Mosley wants to continue Alimta and his insurance was verified and will cover it. Patient's AE assessment along with CBC and chemistry results were reviewed with patient and Dr. Rogue Bussing and all are within acceptable parameters for patient to receive his infusion today. Pharmacist notified that she can go ahead and prepare the Alimta per Dr. Rogue Bussing. Mitchell Mosley is aware that he  can call the research office if he has any questions or concerns prior to his next visit. Will contact [employer] monitor for further instructions as this is the last planned study visit for this patient. Current and ongoing adverse events with grade and attribution are as follows:   Adverse Event Log Study/Protocol: [name] S130 Cycle: 641 Event Grade Onset Date Resolved Date Drug Name Pemetrexed Attribution Treatment Comments  Hyponatremia Grade 1 02/20/2019 10/17/18 03/13/2019 11/07/18  Unlikely  Sodium now 135mg /dL  Cough Grade 1 09/19/18 10/17/18  Unlikely    Nasal congestion Grade 1 09/19/18 10/17/18  Unlikely    Elevated serum creatinine Grade 1 10/17/18 06/16/18 07/25/18 11/07/18 07/04/18 09/05/18  Possible  Increase fluids   Edema (mild) Grade 1  12/06/2017   Possible  Trace in right and 1+ edema in left ankle  Fatigue Grade1 06/27/2019  01/14/16  03/04/2019  Possible    Tendonitis Grade 1  09/21/16   Unrelated  Meloxicam  Hypocalcemia Grade 1 05/26/18 06/16/18  Possible  Resolved, Calcium level WNL  Hyperglycemia Grade 1 05/26/18 11/09/15  09/08/16  Unlikely  Non-fasting glucose was 128mg /dL  Thrombocytopenia Grade 1 05/15/2019 09/26/18 07/04/18 05/26/18 12/06/17 08/02/17 07/17/2019 11/27/18 07/25/18 06/16/18 08/02/17  08/23/17  Possible  Platelet count 155,000 today  Nausea Grade 1 11/16/17 11/17/17  Unrelated  R/T antibiotic  Abrasions on Right knuckles Grade 1 02/03/18 02/28/18  Unrelated  r/t Dog walking accident  Pruritis Grade 1  04/19/17   Possible Uses generic lotion with relief deep  itching under the skin of palms, feet & right upper arm   Elevated cholesterol Grade 1 03/15/14   Unrelated  No ongoing monitoring available  Valvular Heart Disease Grade 1 10/19/13   Unrelated      Erectile Dysfunction  Grade 2 12/11/15   Possible Cialis   Skin hyperpigmentation Grade 1 07/08/15   Unrelated  Medial aspect of feel bil   Aneurysm of Ascending Aorta Grade 1 10/14/13   Unrelated  mentioned on CT scans  Possible arthritis of Left Knee Grade 1 05/06/15   Unrelated    Elevated BUN Grade 1 05/15/2019 09/26/18 07/25/18  10/17/18   08/18/18  Possible    GI upset Grade 1 06/27/2019   Possible  Pt reports this is new, but mild  Yolande Jolly, BSN, MHA, OCN 07/17/2019 11:21 AM

## 2019-07-18 ENCOUNTER — Other Ambulatory Visit: Payer: Self-pay | Admitting: Internal Medicine

## 2019-07-21 ENCOUNTER — Telehealth: Payer: Self-pay | Admitting: Family Medicine

## 2019-07-21 NOTE — Telephone Encounter (Signed)
Yes--would also considr covid testing although unlikely.

## 2019-07-21 NOTE — Telephone Encounter (Signed)
Pt would like to have a nurse call him about some stomach symptoms he is having.  Stomach pain, loose stools, gas  [phone]  Con Memos

## 2019-07-21 NOTE — Telephone Encounter (Signed)
Advised patient as below. He will give [place] an update in 2 days.

## 2019-07-21 NOTE — Telephone Encounter (Signed)
Patient reports that this has been ongoing for about 3 weeks. He denies that his symptoms are in any relation to foods. He denies fever. Denies melania or hematochezia. He reports that he has at least 3 loose stools daily. He feels that this could be from chemo, but his oncologist reports that this is not a typical side effect, but they will continue to monitor it.   I recommended probiotics to help with symptoms. Do you agree? Any other recommendations? Please advise. Thanks!

## 2019-08-07 ENCOUNTER — Other Ambulatory Visit: Payer: Self-pay

## 2019-08-07 ENCOUNTER — Encounter: Payer: Self-pay | Admitting: Internal Medicine

## 2019-08-07 ENCOUNTER — Inpatient Hospital Stay (HOSPITAL_BASED_OUTPATIENT_CLINIC_OR_DEPARTMENT_OTHER): Payer: BC Managed Care – PPO | Admitting: Internal Medicine

## 2019-08-07 ENCOUNTER — Inpatient Hospital Stay: Payer: BC Managed Care – PPO | Attending: Internal Medicine

## 2019-08-07 ENCOUNTER — Inpatient Hospital Stay: Payer: BC Managed Care – PPO

## 2019-08-07 ENCOUNTER — Other Ambulatory Visit: Payer: Self-pay | Admitting: *Deleted

## 2019-08-07 ENCOUNTER — Encounter: Payer: Self-pay | Admitting: *Deleted

## 2019-08-07 DIAGNOSIS — Z006 Encounter for examination for normal comparison and control in clinical research program: Secondary | ICD-10-CM | POA: Diagnosis not present

## 2019-08-07 DIAGNOSIS — Z95828 Presence of other vascular implants and grafts: Secondary | ICD-10-CM

## 2019-08-07 DIAGNOSIS — C3402 Malignant neoplasm of left main bronchus: Secondary | ICD-10-CM | POA: Diagnosis not present

## 2019-08-07 DIAGNOSIS — R944 Abnormal results of kidney function studies: Secondary | ICD-10-CM | POA: Insufficient documentation

## 2019-08-07 DIAGNOSIS — M2550 Pain in unspecified joint: Secondary | ICD-10-CM

## 2019-08-07 DIAGNOSIS — Z5111 Encounter for antineoplastic chemotherapy: Secondary | ICD-10-CM | POA: Diagnosis not present

## 2019-08-07 LAB — COMPREHENSIVE METABOLIC PANEL
ALT: 16 U/L (ref 0–44)
AST: 22 U/L (ref 15–41)
Albumin: 4 g/dL (ref 3.5–5.0)
Alkaline Phosphatase: 41 U/L (ref 38–126)
Anion gap: 8 (ref 5–15)
BUN: 24 mg/dL — ABNORMAL HIGH (ref 6–20)
CO2: 26 mmol/L (ref 22–32)
Calcium: 9 mg/dL (ref 8.9–10.3)
Chloride: 102 mmol/L (ref 98–111)
Creatinine, Ser: 1.5 mg/dL — ABNORMAL HIGH (ref 0.61–1.24)
GFR calc Af Amer: 59 mL/min — ABNORMAL LOW (ref >60)
GFR calc non Af Amer: 51 mL/min — ABNORMAL LOW (ref >60)
Glucose, Bld: 137 mg/dL — ABNORMAL HIGH (ref 70–99)
Potassium: 4 mmol/L (ref 3.5–5.1)
Sodium: 136 mmol/L (ref 135–145)
Total Bilirubin: 0.8 mg/dL (ref 0.3–1.2)
Total Protein: 6.9 g/dL (ref 6.5–8.1)

## 2019-08-07 LAB — CBC WITH DIFFERENTIAL/PLATELET
Abs Immature Granulocytes: 0.02 10*3/uL (ref 0.00–0.07)
Basophils Absolute: 0 10*3/uL (ref 0.0–0.1)
Basophils Relative: 1 %
Eosinophils Absolute: 0.2 10*3/uL (ref 0.0–0.5)
Eosinophils Relative: 3 %
HCT: 37.9 % — ABNORMAL LOW (ref 39.0–52.0)
Hemoglobin: 12.9 g/dL — ABNORMAL LOW (ref 13.0–17.0)
Immature Granulocytes: 0 %
Lymphocytes Relative: 32 %
Lymphs Abs: 1.6 10*3/uL (ref 0.7–4.0)
MCH: 31.9 pg (ref 26.0–34.0)
MCHC: 34 g/dL (ref 30.0–36.0)
MCV: 93.6 fL (ref 80.0–100.0)
Monocytes Absolute: 0.5 10*3/uL (ref 0.1–1.0)
Monocytes Relative: 10 %
Neutro Abs: 2.8 10*3/uL (ref 1.7–7.7)
Neutrophils Relative %: 54 %
Platelets: 158 10*3/uL (ref 150–400)
RBC: 4.05 MIL/uL — ABNORMAL LOW (ref 4.22–5.81)
RDW: 13.4 % (ref 11.5–15.5)
WBC: 5.1 10*3/uL (ref 4.0–10.5)
nRBC: 0 % (ref 0.0–0.2)

## 2019-08-07 LAB — MAGNESIUM: Magnesium: 2 mg/dL (ref 1.7–2.4)

## 2019-08-07 MED ORDER — SODIUM CHLORIDE 0.9 % IV SOLN
Freq: Once | INTRAVENOUS | Status: AC
  Administered 2019-08-07: 15:00:00 via INTRAVENOUS
  Filled 2019-08-07: qty 4

## 2019-08-07 MED ORDER — CYANOCOBALAMIN 1000 MCG/ML IJ SOLN
1000.0000 ug | Freq: Once | INTRAMUSCULAR | Status: AC
  Administered 2019-08-07: 1000 ug via INTRAMUSCULAR
  Filled 2019-08-07: qty 1

## 2019-08-07 MED ORDER — HEPARIN SOD (PORK) LOCK FLUSH 100 UNIT/ML IV SOLN
500.0000 [IU] | Freq: Once | INTRAVENOUS | Status: AC | PRN
  Administered 2019-08-07: 500 [IU]
  Filled 2019-08-07: qty 5

## 2019-08-07 MED ORDER — SODIUM CHLORIDE 0.9% FLUSH
10.0000 mL | Freq: Once | INTRAVENOUS | Status: AC
  Administered 2019-08-07: 10 mL via INTRAVENOUS
  Filled 2019-08-07: qty 10

## 2019-08-07 MED ORDER — SODIUM CHLORIDE 0.9 % IV SOLN
1000.0000 mg | Freq: Once | INTRAVENOUS | Status: AC
  Administered 2019-08-07: 1000 mg via INTRAVENOUS
  Filled 2019-08-07: qty 40

## 2019-08-07 MED ORDER — SODIUM CHLORIDE 0.9 % IV SOLN: 1050.0000 mg | Freq: Once | INTRAVENOUS | Status: DC

## 2019-08-07 MED ORDER — SODIUM CHLORIDE 0.9 % IV SOLN
Freq: Once | INTRAVENOUS | Status: AC
  Administered 2019-08-07: 15:00:00 via INTRAVENOUS
  Filled 2019-08-07: qty 250

## 2019-08-07 MED ORDER — SODIUM CHLORIDE 0.9 % IV SOLN: 1000.0000 mg | Freq: Once | INTRAVENOUS | Status: DC

## 2019-08-07 NOTE — Progress Notes (Signed)
[place] OFFICE PROGRESS NOTE  Patient Care Team: [name], MD as PCP - General [facility])  Cancer Staging No matching staging information was found for the patient.   Oncology History Overview Note  # 2012- METASTATIC ADENO CA of LEFT LUNG [acinar pattern] s/p MED LN Bx [NEG- EGFR/K-ras/ALK mutation];[name]; [name]; CT JULY  2016- NED; JAN 2018- NED;   # AUG 2020-clinical trial closed; continue maintenance Alimta  # Thoracic Aneurysm [4cm- stable]  ----------------------------------------------------------    DIAGNOSIS: [name]  STAGE: 4       ;GOALS: Palliative  CURRENT/MOST RECENT THERAPY [ ] Alimta maintenance    Cancer of hilus of left lung ([place])  07/20/2016 Initial Diagnosis   Cancer of hilus of left lung ([place])      INTERVAL HISTORY:  Mitchell Mosley 58 y.o.  male pleasant patient above history of metastatic adenocarcinoma the lung-NED currently on maintenance Alimta is here for follow-up.  Patient denies any worsening abdominal pain.  No diarrhea.  No fevers or chills.  Has chronic joint pains plantar fasciitis.  Patient takes meloxicam.  Continues to be physically active.    Review of Systems  Constitutional: Negative for chills, diaphoresis, fever, malaise/fatigue and weight loss.  HENT: Negative for nosebleeds and sore throat.   Eyes: Negative for double vision.  Respiratory: Negative for hemoptysis, shortness of breath and wheezing.   Cardiovascular: Positive for leg swelling. Negative for chest pain, palpitations and orthopnea.  Gastrointestinal: Negative for abdominal pain, blood in stool, constipation, diarrhea, heartburn, melena, nausea and vomiting.  Genitourinary: Negative for dysuria, frequency and urgency.  Musculoskeletal: Positive for joint pain.  Skin: Negative.  Negative for itching and  rash.  Neurological: Negative for dizziness, tingling, focal weakness, weakness and headaches.  Endo/Heme/Allergies: Does not bruise/bleed easily.  Psychiatric/Behavioral: Negative for depression. The patient is not nervous/anxious and does not have insomnia.       PAST MEDICAL HISTORY :  Past Medical History:  Diagnosis Date  . Allergy   . Lung cancer ([place])   . Mild valvular heart disease Nov. 2014   per 2 D Echocardiogram done for persistent leg edema, monitored by cardiology  . Peripheral edema     PAST SURGICAL HISTORY :   Past Surgical History:  Procedure Laterality Date  . ELBOW SURGERY    . Left anterior thoracotomy with biopsy  March 2012  . PORTACATH PLACEMENT  March 2012  . TONSILLECTOMY      FAMILY HISTORY :   Family History  Problem Relation Age of Onset  . Leukemia Father   . Diabetes Mother     SOCIAL HISTORY:   Social History   Tobacco Use  . Smoking status: Never Smoker  . Smokeless tobacco: Never Used  Substance Use Topics  . Alcohol use: Yes    Alcohol/week: 0.0 standard drinks    Comment: "social drinker"   . Drug use: No    ALLERGIES:  is allergic to penicillins.  MEDICATIONS:  Current Outpatient Medications  Medication Sig Dispense Refill  . folic acid (FOLVITE) 1 MG tablet Take 1 tablet (1 mg total) by mouth daily. 90 tablet 3  . furosemide (LASIX) 20 MG tablet TAKE 1 TABLET (20 MG TOTAL) BY MOUTH DAILY AS NEEDED FOR SWELLING 90 tablet 1  . loratadine (CLARITIN) 10 MG tablet Take 1 tablet (10 mg total) by mouth daily.    Marland Kitchen  meloxicam (MOBIC) 7.5 MG tablet Take 7.5 mg by mouth daily.     . Multiple Vitamin (MULTIVITAMIN) capsule Take 1 capsule by mouth daily. Reported on 02/24/2016    . Omega-3 Fatty Acids (FISH OIL) 1000 MG CAPS Take 1 capsule by mouth daily. Reported on 02/24/2016    . senna (SENOKOT) 8.6 MG tablet Take 1 tablet by mouth daily. Take as needed around time of chemotherapy    . spironolactone (ALDACTONE) 25 MG tablet TAKE 1  TABLET BY MOUTH EVERY DAY 90 tablet 3  . tadalafil (CIALIS) 20 MG tablet TAKE 1 TABLET BY MOUTH EVERY 2 DAYS AS NEEDED. 6 tablet 1  . vitamin B-12 (CYANOCOBALAMIN) 1000 MCG tablet Take 1,000 mcg by mouth daily.     No current facility-administered medications for this visit.    Facility-Administered Medications Ordered in Other Visits  Medication Dose Route Frequency Provider Last Rate Last Dose  . sodium chloride 0.9 % injection 10 mL  10 mL Intracatheter PRN [name], MD   10 mL at 05/27/15 1543  . sodium chloride 0.9 % injection 10 mL  10 mL Intracatheter PRN [name], MD   10 mL at 06/17/15 1510  . sodium chloride 0.9 % injection 10 mL  10 mL Intravenous PRN [name], MD   10 mL at 07/08/15 1340    PHYSICAL EXAMINATION: ECOG PERFORMANCE STATUS: 0 - Asymptomatic  BP 137/85   Pulse 69   Temp 97.8 F (36.6 C) (Tympanic)   Resp 20   Wt 216 lb (98 kg)   BMI 30.99 kg/m   Filed Weights   08/07/19 1400  Weight: 216 lb (98 kg)    Physical Exam  Constitutional: He is oriented to person, place, and time and well-developed, well-nourished, and in no distress.  HENT:  Head: Normocephalic and atraumatic.  Mouth/Throat: Oropharynx is clear and moist. No oropharyngeal exudate.  Eyes: Pupils are equal, round, and reactive to light.  Neck: Normal range of motion. Neck supple.  Cardiovascular: Normal rate and regular rhythm.  Pulmonary/Chest: No respiratory distress. He has no wheezes.  Abdominal: Soft. Bowel sounds are normal. He exhibits no distension and no mass. There is no abdominal tenderness. There is no rebound and no guarding.  Musculoskeletal: Normal range of motion.        General: Edema present. No tenderness.     Comments: Grade 1 swelling in the legs bilaterally.  Neurological: He is alert and oriented to person, place, and time.  Skin: Skin is warm.  Psychiatric: Affect normal.     LABORATORY DATA:  I have reviewed the data as listed    Component  Value Date/Time   NA 136 08/07/2019 1349   NA 138 12/10/2014 1341   K 4.0 08/07/2019 1349   K 3.6 12/10/2014 1341   CL 102 08/07/2019 1349   CL 102 12/10/2014 1341   CO2 26 08/07/2019 1349   CO2 30 12/10/2014 1341   GLUCOSE 137 (H) 08/07/2019 1349   GLUCOSE 160 (H) 12/10/2014 1341   BUN 24 (H) 08/07/2019 1349   BUN 21 (H) 12/10/2014 1341   CREATININE 1.50 (H) 08/07/2019 1349   CREATININE 1.10 03/25/2015 1453   CALCIUM 9.0 08/07/2019 1349   CALCIUM 8.4 (L) 12/10/2014 1341   PROT 6.9 08/07/2019 1349   PROT 6.6 12/10/2014 1341   ALBUMIN 4.0 08/07/2019 1349   ALBUMIN 3.4 12/10/2014 1341   AST 22 08/07/2019 1349   AST 17 12/10/2014 1341   ALT 16 08/07/2019 1349  ALT 23 12/10/2014 1341   ALKPHOS 41 08/07/2019 1349   ALKPHOS 50 12/10/2014 1341   BILITOT 0.8 08/07/2019 1349   BILITOT 0.5 12/10/2014 1341   GFRNONAA 51 (L) 08/07/2019 1349   GFRNONAA >60 03/25/2015 1453   GFRAA 59 (L) 08/07/2019 1349   GFRAA >60 03/25/2015 1453    No results found for: SPEP, UPEP  Lab Results  Component Value Date   WBC 5.1 08/07/2019   NEUTROABS 2.8 08/07/2019   HGB 12.9 (L) 08/07/2019   HCT 37.9 (L) 08/07/2019   MCV 93.6 08/07/2019   PLT 158 08/07/2019      Chemistry      Component Value Date/Time   NA 136 08/07/2019 1349   NA 138 12/10/2014 1341   K 4.0 08/07/2019 1349   K 3.6 12/10/2014 1341   CL 102 08/07/2019 1349   CL 102 12/10/2014 1341   CO2 26 08/07/2019 1349   CO2 30 12/10/2014 1341   BUN 24 (H) 08/07/2019 1349   BUN 21 (H) 12/10/2014 1341   CREATININE 1.50 (H) 08/07/2019 1349   CREATININE 1.10 03/25/2015 1453      Component Value Date/Time   CALCIUM 9.0 08/07/2019 1349   CALCIUM 8.4 (L) 12/10/2014 1341   ALKPHOS 41 08/07/2019 1349   ALKPHOS 50 12/10/2014 1341   AST 22 08/07/2019 1349   AST 17 12/10/2014 1341   ALT 16 08/07/2019 1349   ALT 23 12/10/2014 1341   BILITOT 0.8 08/07/2019 1349   BILITOT 0.5 12/10/2014 1341       RADIOGRAPHIC STUDIES: I have  personally reviewed the radiological images as listed and agreed with the findings in the report. No results found.   ASSESSMENT & PLAN:  Cancer of hilus of left lung ([place]) # Adenocarcinoma the lung; stage IV; June 6th  2020-CT scan chest/a/p-shows no evidence of metastatic disease; 32m -LLL/ new-likely inflammatory.  Stable right upper lobe 3 mm nodule.  Clinically STABLE.   # Proceed with Alimta today; Labs today reviewed;  acceptable for treatment today.  Plan to get a CT scan again in October 2020.  I think is reasonable for the patient chemotherapy dose to around to 1000 mg [rather than 105 0 mg]-to save chemotherapy drug.  Patient agrees.  # Chronic mild joint pain - Stable. Stop meloxicam; tylenol prn.   #Elevation in creatinine baseline around 1.  Today creatinine- 1.5; increase fluids/ stop meloxicam; ok with tylenol prn.   # DISPOSITION:  # Alimta today; add B12 injection today.  # follow up in 3 weeks- MD/ labs- cbc/cmp/Alimta Dr.B   Orders Placed This Encounter  Procedures  . CBC with Differential    Standing Status:   Future    Standing Expiration Date:   08/06/2020  . Comprehensive metabolic panel    Standing Status:   Future    Standing Expiration Date:   08/06/2020   All questions were answered. The patient knows to call the clinic with any problems, questions or concerns.      GCammie Sickle MD 08/07/2019 4:27 PM

## 2019-08-07 NOTE — Progress Notes (Signed)
Per Ulis Rias, Research RN, proceed with scheduled treatment of Alimta and Vitamin B12 injection.

## 2019-08-07 NOTE — Research (Signed)
08/07/2019 Patient Mitchell Mosley was contacted by phone after he presented cancer center for his cycle #643 labs, but prior to MD visit assessment & infusion on the [employer] research study.  Patient reports he has no new adverse event/concerns since his last clinic visit and reports the some mild GI upset he was experiencing resolved about 2 days after calling his PCP for advice on 07/21/2019. States he used Probiotics for 2 days and the issue completely resolved. Reports continued  occasional cramping in his upper right thigh and still has some mild edema in his BLE, mostly in his ankles. Patient verifies he is still taking both Lasix and Spironolactone daily to control the swelling. He reports no changes the odd itching in his palms, the bottoms of his feet sporadically and in his right upper inner arm, and reports using generic skin lotion as needed to manage this. Patient reports his fatigue is mild and lasts just a few days after he receives Alimta infusion. 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 Reports tolerating the heat very well this past week. Mitchell Mosley was previously informed by this research nurse and Dr. Rogue Mosley that we were notified that the Mitchell Mosley study is going to close at the end of August, but this has been changed to December, 2020. Dr. Rogue Mosley also discussed the benefit of having him continue receiving maintenance Alimta. Mitchell Mosley wants to continue Alimta and his insurance was verified and will cover it. Dr. Rogue Mosley discussed with the patient that he plans to round his current Alimta dose to 1000mg  at this time and states that there is no expected decrease in efficacy from reducing the current dose of Alimta to 1000mg  from the previous 1050mg  dose. Vitamin B12 injection will be administered today as well. Patient's AE assessment along with CBC and chemistry results were reviewed with patient and Dr. Rogue Mosley and all are within  acceptable parameters for patient to receive his infusion as scheduled. Pharmacist notified of the Alimta dose change per Dr. Rogue Mosley. Mitchell Mosley is aware that he can call the research office if he has any questions or concerns prior to his next visit. Will continue study visits per S130 protocol until the current inventory of Alimta is exhausted, and transfer patient to commercially available Alimta at that time. Current and ongoing adverse events with grade and attribution are as follows:   Adverse Event Log Study/Protocol: Mitchell Mosley S130 Cycle: 494 Event Grade Onset Date Resolved Date Drug Name Pemetrexed Attribution Treatment Comments  Hyponatremia Grade 1 02/20/2019 10/17/18 03/13/2019 11/07/18  Unlikely    Cough Grade 1 09/19/18 10/17/18  Unlikely    Nasal congestion Grade 1 09/19/18 10/17/18  Unlikely    Elevated serum creatinine Grade 1 08/07/2019 10/17/18 06/16/18 07/25/18  11/07/18 07/04/18 09/05/18  Possible Increase po fluid intake Serum creatinine is 1.5 today  Edema (mild) Grade 1  12/06/2017   Possible  Trace in right and 1+ edema in left ankle  Fatigue Grade1 06/27/2019  01/14/16  03/04/2019  Possible    Tendonitis Grade 1  09/21/16   Unrelated  Meloxicam  Hypocalcemia Grade 1 05/26/18 06/16/18  Possible  Resolved, Calcium level WNL  Hyperglycemia Grade 1 05/26/18 11/09/15  09/08/16  Unlikely  Non-fasting glucose was 128mg /dL  Thrombocytopenia Grade 1 05/15/2019 09/26/18 07/04/18 05/26/18 12/06/17 08/02/17 07/17/2019 11/27/18 07/25/18 06/16/18 08/02/17  08/23/17  Possible  Platelet count 155,000 today  Nausea Grade 1 11/16/17 11/17/17  Unrelated  R/T  antibiotic  Abrasions on Right knuckles Grade 1 02/03/18 02/28/18  Unrelated  r/t Dog walking accident  Pruritis Grade 1  04/19/17   Possible Uses generic lotion with relief deep itching under the skin of palms, feet & right upper arm   Elevated cholesterol Grade 1 03/15/14   Unrelated   No ongoing monitoring available  Valvular Heart Disease Grade 1 10/19/13   Unrelated      Erectile Dysfunction  Grade 2 12/11/15   Possible Cialis   Skin hyperpigmentation Grade 1 07/08/15   Unrelated  Medial aspect of feel bil  Aneurysm of Ascending Aorta Grade 1 10/14/13   Unrelated  mentioned on CT scans  Possible arthritis of Left Knee Grade 1 05/06/15   Unrelated    Elevated BUN Grade 1 05/15/2019 09/26/18 07/25/18  10/17/18   08/18/18  Possible    GI upset Grade 1 06/27/2019 07/23/2019  Possible  Resolved with pro-biotics  Yolande Jolly, BSN, MHA, OCN 08/07/2019 3:16 PM

## 2019-08-07 NOTE — Assessment & Plan Note (Addendum)
#   Adenocarcinoma the lung; stage IV; June 6th  2020-CT scan chest/a/p-shows no evidence of metastatic disease; 78mm -LLL/ new-likely inflammatory.  Stable right upper lobe 3 mm nodule.  Clinically STABLE.   # Proceed with Alimta today; Labs today reviewed;  acceptable for treatment today.  Plan to get a CT scan again in October 2020.  I think is reasonable for the patient chemotherapy dose to around to 1000 mg [rather than 105 0 mg]-to save chemotherapy drug.  Patient agrees.  # Chronic mild joint pain - Stable. Stop meloxicam; tylenol prn.   #Elevation in creatinine baseline around 1.  Today creatinine- 1.5; increase fluids/ stop meloxicam; ok with tylenol prn.   # DISPOSITION:  # Alimta today; add B12 injection today.  # follow up in 3 weeks- MD/ labs- cbc/cmp/Alimta Dr.B

## 2019-08-27 ENCOUNTER — Encounter: Payer: Self-pay | Admitting: Internal Medicine

## 2019-08-27 ENCOUNTER — Other Ambulatory Visit: Payer: Self-pay

## 2019-08-28 ENCOUNTER — Inpatient Hospital Stay: Payer: BC Managed Care – PPO

## 2019-08-28 ENCOUNTER — Inpatient Hospital Stay (HOSPITAL_BASED_OUTPATIENT_CLINIC_OR_DEPARTMENT_OTHER): Payer: BC Managed Care – PPO | Admitting: Internal Medicine

## 2019-08-28 ENCOUNTER — Encounter: Payer: Self-pay | Admitting: *Deleted

## 2019-08-28 ENCOUNTER — Other Ambulatory Visit: Payer: Self-pay

## 2019-08-28 DIAGNOSIS — Z006 Encounter for examination for normal comparison and control in clinical research program: Secondary | ICD-10-CM | POA: Diagnosis not present

## 2019-08-28 DIAGNOSIS — Z5111 Encounter for antineoplastic chemotherapy: Secondary | ICD-10-CM | POA: Diagnosis not present

## 2019-08-28 DIAGNOSIS — R944 Abnormal results of kidney function studies: Secondary | ICD-10-CM | POA: Diagnosis not present

## 2019-08-28 DIAGNOSIS — C3402 Malignant neoplasm of left main bronchus: Secondary | ICD-10-CM | POA: Diagnosis not present

## 2019-08-28 DIAGNOSIS — M2550 Pain in unspecified joint: Secondary | ICD-10-CM | POA: Diagnosis not present

## 2019-08-28 DIAGNOSIS — Z95828 Presence of other vascular implants and grafts: Secondary | ICD-10-CM

## 2019-08-28 LAB — CBC WITH DIFFERENTIAL/PLATELET
Abs Immature Granulocytes: 0.03 10*3/uL (ref 0.00–0.07)
Basophils Absolute: 0 10*3/uL (ref 0.0–0.1)
Basophils Relative: 1 %
Eosinophils Absolute: 0.1 10*3/uL (ref 0.0–0.5)
Eosinophils Relative: 1 %
HCT: 36.9 % — ABNORMAL LOW (ref 39.0–52.0)
Hemoglobin: 12.7 g/dL — ABNORMAL LOW (ref 13.0–17.0)
Immature Granulocytes: 1 %
Lymphocytes Relative: 32 %
Lymphs Abs: 1.7 10*3/uL (ref 0.7–4.0)
MCH: 32.4 pg (ref 26.0–34.0)
MCHC: 34.4 g/dL (ref 30.0–36.0)
MCV: 94.1 fL (ref 80.0–100.0)
Monocytes Absolute: 0.5 10*3/uL (ref 0.1–1.0)
Monocytes Relative: 10 %
Neutro Abs: 3.1 10*3/uL (ref 1.7–7.7)
Neutrophils Relative %: 55 %
Platelets: 149 10*3/uL — ABNORMAL LOW (ref 150–400)
RBC: 3.92 MIL/uL — ABNORMAL LOW (ref 4.22–5.81)
RDW: 12.9 % (ref 11.5–15.5)
WBC: 5.5 10*3/uL (ref 4.0–10.5)
nRBC: 0 % (ref 0.0–0.2)

## 2019-08-28 LAB — COMPREHENSIVE METABOLIC PANEL
ALT: 20 U/L (ref 0–44)
AST: 24 U/L (ref 15–41)
Albumin: 3.8 g/dL (ref 3.5–5.0)
Alkaline Phosphatase: 42 U/L (ref 38–126)
Anion gap: 8 (ref 5–15)
BUN: 21 mg/dL — ABNORMAL HIGH (ref 6–20)
CO2: 27 mmol/L (ref 22–32)
Calcium: 8.9 mg/dL (ref 8.9–10.3)
Chloride: 98 mmol/L (ref 98–111)
Creatinine, Ser: 1.18 mg/dL (ref 0.61–1.24)
GFR calc Af Amer: >60 mL/min (ref >60)
GFR calc non Af Amer: >60 mL/min (ref >60)
Glucose, Bld: 179 mg/dL — ABNORMAL HIGH (ref 70–99)
Potassium: 3.8 mmol/L (ref 3.5–5.1)
Sodium: 133 mmol/L — ABNORMAL LOW (ref 135–145)
Total Bilirubin: 0.8 mg/dL (ref 0.3–1.2)
Total Protein: 7 g/dL (ref 6.5–8.1)

## 2019-08-28 LAB — URINALYSIS, COMPLETE (UACMP) WITH MICROSCOPIC
Bacteria, UA: NONE SEEN
Bilirubin Urine: NEGATIVE
Glucose, UA: NEGATIVE mg/dL
Hgb urine dipstick: NEGATIVE
Ketones, ur: NEGATIVE mg/dL
Leukocytes,Ua: NEGATIVE
Nitrite: NEGATIVE
Protein, ur: NEGATIVE mg/dL
Specific Gravity, Urine: 1.005 (ref 1.005–1.030)
Squamous Epithelial / HPF: NONE SEEN (ref 0–5)
pH: 6 (ref 5.0–8.0)

## 2019-08-28 MED ORDER — SODIUM CHLORIDE 0.9 % IV SOLN
1000.0000 mg | Freq: Once | INTRAVENOUS | Status: AC
  Administered 2019-08-28: 1000 mg via INTRAVENOUS
  Filled 2019-08-28: qty 40

## 2019-08-28 MED ORDER — SODIUM CHLORIDE 0.9 % IV SOLN
Freq: Once | INTRAVENOUS | Status: AC
  Administered 2019-08-28: 15:00:00 via INTRAVENOUS
  Filled 2019-08-28: qty 4

## 2019-08-28 MED ORDER — SODIUM CHLORIDE 0.9% FLUSH
10.0000 mL | Freq: Once | INTRAVENOUS | Status: AC
  Administered 2019-08-28: 10 mL via INTRAVENOUS
  Filled 2019-08-28: qty 10

## 2019-08-28 MED ORDER — HEPARIN SOD (PORK) LOCK FLUSH 100 UNIT/ML IV SOLN
500.0000 [IU] | Freq: Once | INTRAVENOUS | Status: AC
  Administered 2019-08-28: 500 [IU] via INTRAVENOUS
  Filled 2019-08-28: qty 5

## 2019-08-28 MED ORDER — SODIUM CHLORIDE 0.9 % IV SOLN
Freq: Once | INTRAVENOUS | Status: AC
  Administered 2019-08-28: 15:00:00 via INTRAVENOUS
  Filled 2019-08-28: qty 250

## 2019-08-28 NOTE — Assessment & Plan Note (Addendum)
#   Adenocarcinoma the lung; stage IV; June 6th  2020-CT scan chest/a/p-shows no evidence of metastatic disease; 66mm -LLL/ new-likely inflammatory.  Stable right upper lobe 3 mm nodule.  Clinically stable.  # Proceed with Alimta today; Labs today reviewed;  acceptable for treatment today.  We will plan to get CT scan ordered at next visit.  # Chronic mild joint pain -flareup-after stopping meloxicam.  Currently improved with Tylenol okay to take Tylenol once a day/see below  #Recent elevation of creatinine of 1.5-Baseline 1. Question meloxicam/today back to baseline.  Again reiterated/increased hydration.  # DISPOSITION:  # Alimta today # follow up in 3 weeks- MD/ labs- cbc/cmp/Alimta Dr.B

## 2019-08-28 NOTE — Progress Notes (Signed)
[place] OFFICE PROGRESS NOTE  Patient Care Team: [name], MD as PCP - [facility])  Cancer Staging No matching staging information was found for the patient.   Oncology History Overview Note  # 2012- METASTATIC ADENO CA of LEFT LUNG [acinar pattern] s/p MED LN Bx [NEG- EGFR/K-ras/ALK mutation];[name]; [name]; CT JULY  2016- NED; JAN 2018- NED;   # AUG 2020-clinical trial closed; continue maintenance Alimta  # Thoracic Aneurysm [4cm- stable]  ----------------------------------------------------------    DIAGNOSIS: [name]  STAGE: 4       ;GOALS: Palliative  CURRENT/MOST RECENT THERAPY '[ ]'  Alimta maintenance    Cancer of hilus of left lung ([place])  07/20/2016 Initial Diagnosis   Cancer of hilus of left lung (HCC)      INTERVAL HISTORY:  Mitchell Mosley 58 y.o.  male pleasant patient above history of metastatic adenocarcinoma the lung-NED currently on maintenance Alimta is here for follow-up.  Patient last visit was recommended to stop taking meloxicam because of slight increase in creatinine levels at 1.5.  Patient noted to have flareup of his arthritis.  Currently improved with Tylenol.  No nausea no vomiting.  Appetite is good.  No headaches.  Intermittent cramps in his legs seem to be positional.  Review of Systems  Constitutional: Negative for chills, diaphoresis, fever, malaise/fatigue and weight loss.  HENT: Negative for nosebleeds and sore throat.   Eyes: Negative for double vision.  Respiratory: Negative for hemoptysis, shortness of breath and wheezing.   Cardiovascular: Positive for leg swelling. Negative for chest pain, palpitations and orthopnea.  Gastrointestinal: Negative for abdominal pain, blood in stool, constipation, diarrhea, heartburn, melena, nausea and vomiting.  Genitourinary:  Negative for dysuria, frequency and urgency.  Musculoskeletal: Positive for joint pain.  Skin: Negative.  Negative for itching and rash.  Neurological: Negative for dizziness, tingling, focal weakness, weakness and headaches.  Endo/Heme/Allergies: Does not bruise/bleed easily.  Psychiatric/Behavioral: Negative for depression. The patient is not nervous/anxious and does not have insomnia.       PAST MEDICAL HISTORY :  Past Medical History:  Diagnosis Date  . Allergy   . Lung cancer ([place])   . Mild valvular heart disease Nov. 2014   per 2 D Echocardiogram done for persistent leg edema, monitored by cardiology  . Peripheral edema     PAST SURGICAL HISTORY :   Past Surgical History:  Procedure Laterality Date  . ELBOW SURGERY    . Left anterior thoracotomy with biopsy  March 2012  . PORTACATH PLACEMENT  March 2012  . TONSILLECTOMY      FAMILY HISTORY :   Family History  Problem Relation Age of Onset  . Leukemia Father   . Diabetes Mother     SOCIAL HISTORY:   Social History   Tobacco Use  . Smoking status: Never Smoker  . Smokeless tobacco: Never Used  Substance Use Topics  . Alcohol use: Yes    Alcohol/week: 0.0 standard drinks    Comment: "social drinker"   . Drug use: No    ALLERGIES:  is allergic to penicillins.  MEDICATIONS:  Current Outpatient Medications  Medication Sig Dispense Refill  . folic acid (FOLVITE) 1 MG tablet Take 1 tablet (1 mg total) by mouth daily. 90 tablet 3  . furosemide (LASIX) 20 MG tablet TAKE 1 TABLET (20 MG TOTAL) BY MOUTH DAILY AS NEEDED FOR SWELLING 90 tablet  1  . spironolactone (ALDACTONE) 25 MG tablet TAKE 1 TABLET BY MOUTH EVERY DAY 90 tablet 3  . loratadine (CLARITIN) 10 MG tablet Take 1 tablet (10 mg total) by mouth daily. (Patient not taking: Reported on 08/27/2019)    . meloxicam (MOBIC) 7.5 MG tablet Take 7.5 mg by mouth daily.     . Multiple Vitamin (MULTIVITAMIN) capsule Take 1 capsule by mouth daily. Reported on 02/24/2016     . Omega-3 Fatty Acids (FISH OIL) 1000 MG CAPS Take 1 capsule by mouth daily. Reported on 02/24/2016    . senna (SENOKOT) 8.6 MG tablet Take 1 tablet by mouth daily. Take as needed around time of chemotherapy    . tadalafil (CIALIS) 20 MG tablet TAKE 1 TABLET BY MOUTH EVERY 2 DAYS AS NEEDED. (Patient not taking: Reported on 08/27/2019) 6 tablet 1  . vitamin B-12 (CYANOCOBALAMIN) 1000 MCG tablet Take 1,000 mcg by mouth daily.     No current facility-administered medications for this visit.    Facility-Administered Medications Ordered in Other Visits  Medication Dose Route Frequency Provider Last Rate Last Dose  . 0.9 %  sodium chloride infusion   Intravenous Once [name], MD      . INV-PEMEtrexed Lilly S130 (ALIMTA) 1,000 mg in sodium chloride 0.9 % 100 mL chemo infusion  1,000 mg Intravenous Once Charlaine Dalton R, MD      . ondansetron (ZOFRAN) 8 mg, dexamethasone (DECADRON) 10 mg in sodium chloride 0.9 % 50 mL IVPB   Intravenous Once Charlaine Dalton R, [name], MD   10 mL at 05/27/15 1543  . sodium chloride 0.9 % injection 10 mL  10 mL Intracatheter PRN [name], MD   10 mL at 06/17/15 1510  . sodium chloride 0.9 % injection 10 mL  10 mL Intravenous PRN [name], MD   10 mL at 07/08/15 1340    PHYSICAL EXAMINATION: ECOG PERFORMANCE STATUS: 0 - Asymptomatic  BP 119/78 (BP Location: Left Arm, Patient Position: Sitting, Cuff Size: Normal)   Pulse 83   Temp 97.7 F (36.5 C) (Tympanic)   Wt 221 lb (100.2 kg)   BMI 31.71 kg/m   Filed Weights   08/27/19 1619  Weight: 221 lb (100.2 kg)    Physical Exam  Constitutional: He is oriented to person, place, and time and well-developed, well-nourished, and in no distress.  HENT:  Head: Normocephalic and atraumatic.  Mouth/Throat: Oropharynx is clear and moist. No oropharyngeal exudate.  Eyes: Pupils are equal, round, and  reactive to light.  Neck: Normal range of motion. Neck supple.  Cardiovascular: Normal rate and regular rhythm.  Pulmonary/Chest: No respiratory distress. He has no wheezes.  Abdominal: Soft. Bowel sounds are normal. He exhibits no distension and no mass. There is no abdominal tenderness. There is no rebound and no guarding.  Musculoskeletal: Normal range of motion.        General: Edema present. No tenderness.     Comments: Grade 1 swelling in the legs bilaterally.  Neurological: He is alert and oriented to person, place, and time.  Skin: Skin is warm.  Psychiatric: Affect normal.     LABORATORY DATA:  I have reviewed the data as listed    Component Value Date/Time   NA 133 (L) 08/28/2019 1407   NA 138 12/10/2014 1341   K 3.8 08/28/2019 1407   K 3.6 12/10/2014 1341  CL 98 08/28/2019 1407   CL 102 12/10/2014 1341   CO2 27 08/28/2019 1407   CO2 30 12/10/2014 1341   GLUCOSE 179 (H) 08/28/2019 1407   GLUCOSE 160 (H) 12/10/2014 1341   BUN 21 (H) 08/28/2019 1407   BUN 21 (H) 12/10/2014 1341   CREATININE 1.18 08/28/2019 1407   CREATININE 1.10 03/25/2015 1453   CALCIUM 8.9 08/28/2019 1407   CALCIUM 8.4 (L) 12/10/2014 1341   PROT 7.0 08/28/2019 1407   PROT 6.6 12/10/2014 1341   ALBUMIN 3.8 08/28/2019 1407   ALBUMIN 3.4 12/10/2014 1341   AST 24 08/28/2019 1407   AST 17 12/10/2014 1341   ALT 20 08/28/2019 1407   ALT 23 12/10/2014 1341   ALKPHOS 42 08/28/2019 1407   ALKPHOS 50 12/10/2014 1341   BILITOT 0.8 08/28/2019 1407   BILITOT 0.5 12/10/2014 1341   GFRNONAA >60 08/28/2019 1407   GFRNONAA >60 03/25/2015 1453   GFRAA >60 08/28/2019 1407   GFRAA >60 03/25/2015 1453    No results found for: SPEP, UPEP  Lab Results  Component Value Date   WBC 5.5 08/28/2019   NEUTROABS 3.1 08/28/2019   HGB 12.7 (L) 08/28/2019   HCT 36.9 (L) 08/28/2019   MCV 94.1 08/28/2019   PLT 149 (L) 08/28/2019      Chemistry      Component Value Date/Time   NA 133 (L) 08/28/2019 1407   NA  138 12/10/2014 1341   K 3.8 08/28/2019 1407   K 3.6 12/10/2014 1341   CL 98 08/28/2019 1407   CL 102 12/10/2014 1341   CO2 27 08/28/2019 1407   CO2 30 12/10/2014 1341   BUN 21 (H) 08/28/2019 1407   BUN 21 (H) 12/10/2014 1341   CREATININE 1.18 08/28/2019 1407   CREATININE 1.10 03/25/2015 1453      Component Value Date/Time   CALCIUM 8.9 08/28/2019 1407   CALCIUM 8.4 (L) 12/10/2014 1341   ALKPHOS 42 08/28/2019 1407   ALKPHOS 50 12/10/2014 1341   AST 24 08/28/2019 1407   AST 17 12/10/2014 1341   ALT 20 08/28/2019 1407   ALT 23 12/10/2014 1341   BILITOT 0.8 08/28/2019 1407   BILITOT 0.5 12/10/2014 1341       RADIOGRAPHIC STUDIES: I have personally reviewed the radiological images as listed and agreed with the findings in the report. No results found.   ASSESSMENT & PLAN:  Cancer of hilus of left lung ([place]) # Adenocarcinoma the lung; stage IV; June 6th  2020-CT scan chest/a/p-shows no evidence of metastatic disease; 42m -LLL/ new-likely inflammatory.  Stable right upper lobe 3 mm nodule.  Clinically stable.  # Proceed with Alimta today; Labs today reviewed;  acceptable for treatment today.  We will plan to get CT scan ordered at next visit.  # Chronic mild joint pain -flareup-after stopping meloxicam.  Currently improved with Tylenol okay to take Tylenol once a day/see below  #Recent elevation of creatinine of 1.5-Baseline 1. Question meloxicam/today back to baseline.  Again reiterated/increased hydration.  # DISPOSITION:  # Alimta today # follow up in 3 weeks- MD/ labs- cbc/cmp/Alimta Dr.B   No orders of the defined types were placed in this encounter.  All questions were answered. The patient knows to call the clinic with any problems, questions or concerns.      GCammie Sickle MD 08/28/2019 3:01 PM

## 2019-08-28 NOTE — Research (Signed)
Patient Mitchell Mosley presented to the cancer center for his cycle #644  MD visit assessment & infusion on the [employer] research study.  Patient reports he has experienced a lot of generalized aches and pains since he was last week due to not taking Mobic, per Dr. Rogue Bussing. He also reports experiencing increased muscle cramping in ble at the bend of his hips, left side more than right. He has experienced this in the past and Dr. Rogue Bussing states it is most likely due to muscle fatigue. Patient still has some mild edema in his BLE, mostly in his ankles and reports he is still taking both Lasix and Spironolactone daily to control the swelling. He reports no changes the odd itching in his palms, the bottoms of his feet sporadically and in his right upper inner arm, and reports using generic skin lotion as needed to manage this. Mitchell Mosley states he is still experiencing some fatigue that is mild and lasts just a few days after he receives Alimta infusion. He is working full-time and gives Software engineer daily. Mitchell Mosley is aware and was previously informed by this research nurse and Dr. Rogue Bussing that we were notified that the [facility] S130 study is going to close in December, 2020, and there was some discussion about his last projected study visit/infusion being due on 10/30/2019 - which is the day after Thanksgiving. Patient is unsure whether he wants to have the infusion that day, or would prefer to move it. Dr. Rogue Bussing has examined patient and reviewed lab values with him, and plans to proceed with Alimta infusion at 1000mg  today. Vitamin B12 injection was administered last visit and will be due again in 6 weeks. Sodium is a little low today at 158mmol/L and platelets are 149,000. Patient still a little anemic with Hbg of 12.7 and Hct 36.9%. Serum creatinine has returned to normal at 1.18mg /dLCBC and chemistry values remain within acceptable parameters for patient to receive his infusion as scheduled.  Pharmacist reminded of the Alimta dose change per Dr. Rogue Bussing. Mitchell Mosley is aware that he can call the research office if he has any questions or concerns prior to his next visit. Will continue study visits per S130 protocol until the current inventory of Alimta is exhausted, and transfer patient to commercially available Alimta at that time. Current and ongoing adverse events with grade and attribution are as follows:   Adverse Event Log Study/Protocol: [name] S130 Cycle: 643 Event Grade Onset Date Resolved Date Drug Name Pemetrexed Attribution Treatment Comments  Hyponatremia Grade 1 08/28/2019 02/20/2019 10/17/18  03/13/2019 11/07/18  Unlikely    Cough Grade 1 09/19/18 10/17/18  Unlikely    Nasal congestion Grade 1 09/19/18 10/17/18  Unlikely    Elevated serum creatinine Grade 1 08/07/2019 10/17/18 06/16/18 07/25/18 08/28/2019 11/07/18 07/04/18 09/05/18  Possible Increase po fluid intake Serum creatinine is 1.18 today  Edema (mild) Grade 1  12/06/2017   Possible  Trace in right and 1+ edema in left ankle  Fatigue Grade1 06/27/2019  01/14/16  03/04/2019  Possible    Tendonitis Grade 1  09/21/16   Unrelated  Meloxicam  Hypocalcemia Grade 1 05/26/18 06/16/18  Possible  Resolved, Calcium level WNL  Hyperglycemia Grade 1 05/26/18 11/09/15  09/08/16  Unlikely  Non-fasting glucose was 179mg /dL  Thrombocytopenia Grade 1 08/28/2019 05/15/2019 09/26/18 07/04/18 05/26/18 12/06/17 08/02/17  07/17/2019 11/27/18 07/25/18 06/16/18 08/02/17  08/23/17  Possible  Platelet count 149,000 today  Nausea Grade 1 11/16/17 11/17/17  Unrelated  R/T  antibiotic  Abrasions on Right knuckles Grade 1 02/03/18 02/28/18  Unrelated  r/t Dog walking accident  Pruritis Grade 1  04/19/17   Possible Uses generic lotion with relief deep itching under the skin of palms, feet & right upper arm   Elevated cholesterol Grade 1 03/15/14   Unrelated  No ongoing monitoring available   Valvular Heart Disease Grade 1 10/19/13   Unrelated      Erectile Dysfunction  Grade 2 12/11/15   Possible Cialis   Skin hyperpigmentation Grade 1 07/08/15   Unrelated  Medial aspect of feel bil  Aneurysm of Ascending Aorta Grade 1 10/14/13   Unrelated  mentioned on CT scans  Possible arthritis of Left Knee Grade 1 05/06/15   Unrelated    Elevated BUN Grade 1 05/15/2019 09/26/18 07/25/18  10/17/18   08/18/18  Possible    GI upset Grade 1 06/27/2019 07/23/2019  Possible  Resolved with pro-biotics  Anemia Grade 1 08/07/2019   Possible  Hgb 12.7 today  Myalgia Grade 1 08/28/2019   Unlikely  Muscle cramps in [address] BSN, [place], OCN 08/28/2019 3:02 PM

## 2019-09-14 DIAGNOSIS — M9902 Segmental and somatic dysfunction of thoracic region: Secondary | ICD-10-CM | POA: Diagnosis not present

## 2019-09-14 DIAGNOSIS — M546 Pain in thoracic spine: Secondary | ICD-10-CM | POA: Diagnosis not present

## 2019-09-14 DIAGNOSIS — M545 Low back pain: Secondary | ICD-10-CM | POA: Diagnosis not present

## 2019-09-14 DIAGNOSIS — M9903 Segmental and somatic dysfunction of lumbar region: Secondary | ICD-10-CM | POA: Diagnosis not present

## 2019-09-15 ENCOUNTER — Other Ambulatory Visit: Payer: Self-pay | Admitting: Internal Medicine

## 2019-09-15 DIAGNOSIS — C33 Malignant neoplasm of trachea: Secondary | ICD-10-CM

## 2019-09-15 DIAGNOSIS — R609 Edema, unspecified: Secondary | ICD-10-CM

## 2019-09-16 DIAGNOSIS — M9902 Segmental and somatic dysfunction of thoracic region: Secondary | ICD-10-CM | POA: Diagnosis not present

## 2019-09-16 DIAGNOSIS — M9903 Segmental and somatic dysfunction of lumbar region: Secondary | ICD-10-CM | POA: Diagnosis not present

## 2019-09-16 DIAGNOSIS — M546 Pain in thoracic spine: Secondary | ICD-10-CM | POA: Diagnosis not present

## 2019-09-16 DIAGNOSIS — M545 Low back pain: Secondary | ICD-10-CM | POA: Diagnosis not present

## 2019-09-17 ENCOUNTER — Other Ambulatory Visit: Payer: Self-pay

## 2019-09-17 ENCOUNTER — Encounter: Payer: Self-pay | Admitting: Internal Medicine

## 2019-09-17 NOTE — Progress Notes (Unsigned)
Patient stated that he had been doing well with no concerns.

## 2019-09-18 ENCOUNTER — Telehealth: Payer: Self-pay | Admitting: *Deleted

## 2019-09-18 ENCOUNTER — Inpatient Hospital Stay: Payer: BC Managed Care – PPO | Admitting: Internal Medicine

## 2019-09-18 ENCOUNTER — Inpatient Hospital Stay: Payer: BC Managed Care – PPO

## 2019-09-18 DIAGNOSIS — M9903 Segmental and somatic dysfunction of lumbar region: Secondary | ICD-10-CM | POA: Diagnosis not present

## 2019-09-18 DIAGNOSIS — M9902 Segmental and somatic dysfunction of thoracic region: Secondary | ICD-10-CM | POA: Diagnosis not present

## 2019-09-18 DIAGNOSIS — M545 Low back pain: Secondary | ICD-10-CM | POA: Diagnosis not present

## 2019-09-18 DIAGNOSIS — M546 Pain in thoracic spine: Secondary | ICD-10-CM | POA: Diagnosis not present

## 2019-09-18 NOTE — Telephone Encounter (Signed)
Patient Aceyn Kathol contacted this research nurse late yesterday to report that his son had been exposed to COVID-19 last week at college and is now at home with him. Patient stated his son would have rapid COVID testing performed today, but then reported he could not be seen until 1:30. Dr. Rogue Bussing was notified of this and states patient should have his appointment changed to Monday since results would likely not be back in time for him to receive his treatment today. Patient was insistent that the results would be back in 30-45 minutes; however they did not return until 2:30 this afternoon. Patient reports his son's COVID test was negative, and his appointment is being rescheduled for Monday afternoon. Patient notified by Jordan Hawks of appointment time. Will call patient and reinforce this as well. Yolande Jolly, BSN, MHA, OCN 09/18/2019 2:52 PM

## 2019-09-21 ENCOUNTER — Inpatient Hospital Stay: Payer: BC Managed Care – PPO

## 2019-09-21 ENCOUNTER — Encounter: Payer: Self-pay | Admitting: Oncology

## 2019-09-21 ENCOUNTER — Inpatient Hospital Stay: Payer: BC Managed Care – PPO | Attending: Internal Medicine | Admitting: Oncology

## 2019-09-21 ENCOUNTER — Other Ambulatory Visit: Payer: Self-pay

## 2019-09-21 ENCOUNTER — Encounter: Payer: Self-pay | Admitting: *Deleted

## 2019-09-21 VITALS — BP 134/85 | HR 85 | Temp 97.0°F | Ht 70.0 in | Wt 217.0 lb

## 2019-09-21 DIAGNOSIS — M545 Low back pain: Secondary | ICD-10-CM | POA: Diagnosis not present

## 2019-09-21 DIAGNOSIS — C3402 Malignant neoplasm of left main bronchus: Secondary | ICD-10-CM | POA: Diagnosis not present

## 2019-09-21 DIAGNOSIS — Z5111 Encounter for antineoplastic chemotherapy: Secondary | ICD-10-CM | POA: Diagnosis not present

## 2019-09-21 DIAGNOSIS — M546 Pain in thoracic spine: Secondary | ICD-10-CM | POA: Diagnosis not present

## 2019-09-21 DIAGNOSIS — Z006 Encounter for examination for normal comparison and control in clinical research program: Secondary | ICD-10-CM | POA: Diagnosis not present

## 2019-09-21 DIAGNOSIS — Z95828 Presence of other vascular implants and grafts: Secondary | ICD-10-CM

## 2019-09-21 DIAGNOSIS — M9903 Segmental and somatic dysfunction of lumbar region: Secondary | ICD-10-CM | POA: Diagnosis not present

## 2019-09-21 DIAGNOSIS — M9902 Segmental and somatic dysfunction of thoracic region: Secondary | ICD-10-CM | POA: Diagnosis not present

## 2019-09-21 LAB — CBC WITH DIFFERENTIAL/PLATELET
Abs Immature Granulocytes: 0.02 10*3/uL (ref 0.00–0.07)
Basophils Absolute: 0 10*3/uL (ref 0.0–0.1)
Basophils Relative: 1 %
Eosinophils Absolute: 0.1 10*3/uL (ref 0.0–0.5)
Eosinophils Relative: 1 %
HCT: 39.9 % (ref 39.0–52.0)
Hemoglobin: 13.7 g/dL (ref 13.0–17.0)
Immature Granulocytes: 0 %
Lymphocytes Relative: 26 %
Lymphs Abs: 1.3 10*3/uL (ref 0.7–4.0)
MCH: 32.8 pg (ref 26.0–34.0)
MCHC: 34.3 g/dL (ref 30.0–36.0)
MCV: 95.5 fL (ref 80.0–100.0)
Monocytes Absolute: 0.5 10*3/uL (ref 0.1–1.0)
Monocytes Relative: 10 %
Neutro Abs: 3.1 10*3/uL (ref 1.7–7.7)
Neutrophils Relative %: 62 %
Platelets: 162 10*3/uL (ref 150–400)
RBC: 4.18 MIL/uL — ABNORMAL LOW (ref 4.22–5.81)
RDW: 12.6 % (ref 11.5–15.5)
WBC: 5 10*3/uL (ref 4.0–10.5)
nRBC: 0 % (ref 0.0–0.2)

## 2019-09-21 LAB — COMPREHENSIVE METABOLIC PANEL
ALT: 17 U/L (ref 0–44)
AST: 23 U/L (ref 15–41)
Albumin: 3.9 g/dL (ref 3.5–5.0)
Alkaline Phosphatase: 40 U/L (ref 38–126)
Anion gap: 9 (ref 5–15)
BUN: 20 mg/dL (ref 6–20)
CO2: 24 mmol/L (ref 22–32)
Calcium: 8.8 mg/dL — ABNORMAL LOW (ref 8.9–10.3)
Chloride: 103 mmol/L (ref 98–111)
Creatinine, Ser: 1.07 mg/dL (ref 0.61–1.24)
GFR calc Af Amer: >60 mL/min (ref >60)
GFR calc non Af Amer: >60 mL/min (ref >60)
Glucose, Bld: 201 mg/dL — ABNORMAL HIGH (ref 70–99)
Potassium: 3.8 mmol/L (ref 3.5–5.1)
Sodium: 136 mmol/L (ref 135–145)
Total Bilirubin: 0.5 mg/dL (ref 0.3–1.2)
Total Protein: 6.9 g/dL (ref 6.5–8.1)

## 2019-09-21 MED ORDER — HEPARIN SOD (PORK) LOCK FLUSH 100 UNIT/ML IV SOLN
500.0000 [IU] | Freq: Once | INTRAVENOUS | Status: AC | PRN
  Administered 2019-09-21: 500 [IU]
  Filled 2019-09-21: qty 5

## 2019-09-21 MED ORDER — SODIUM CHLORIDE 0.9 % IV SOLN
Freq: Once | INTRAVENOUS | Status: AC
  Administered 2019-09-21: 14:00:00 via INTRAVENOUS
  Filled 2019-09-21: qty 250

## 2019-09-21 MED ORDER — SODIUM CHLORIDE 0.9% FLUSH
10.0000 mL | Freq: Once | INTRAVENOUS | Status: AC
  Administered 2019-09-21: 10 mL via INTRAVENOUS
  Filled 2019-09-21: qty 10

## 2019-09-21 MED ORDER — SODIUM CHLORIDE 0.9 % IV SOLN
Freq: Once | INTRAVENOUS | Status: AC
  Administered 2019-09-21: 15:00:00 via INTRAVENOUS
  Filled 2019-09-21: qty 4

## 2019-09-21 MED ORDER — SODIUM CHLORIDE 0.9 % IV SOLN
1000.0000 mg | Freq: Once | INTRAVENOUS | Status: AC
  Administered 2019-09-21: 1000 mg via INTRAVENOUS
  Filled 2019-09-21: qty 40

## 2019-09-21 NOTE — Progress Notes (Signed)
Hematology/Oncology Consult note [facility]  [phone] [phone]  Patient Care Team: [name], MD as PCP - General (Family Medicine)   Name of the patient: Mitchell Mosley  [id number]  11/03/1961   Date of visit: 09/21/19  Diagnosis-stage IV lung cancer  Chief complaint/ Reason for visit-on treatment assessment prior to next cycle of maintenance Alimta  Heme/Onc history:  Oncology History Overview Note  # 2012- METASTATIC ADENO CA of LEFT LUNG [acinar pattern] s/p MED LN Bx [NEG- EGFR/K-ras/ALK mutation];[name]; [name]; CT JULY  2016- NED; JAN 2018- NED;   # AUG 2020-clinical trial closed; continue maintenance Alimta  # Thoracic Aneurysm [4cm- stable]  ----------------------------------------------------------    DIAGNOSIS: [name]  STAGE: 4       ;GOALS: Palliative  CURRENT/MOST RECENT THERAPY '[ ]'  Alimta maintenance    Cancer of hilus of left lung ([place])  07/20/2016 Initial Diagnosis   Cancer of hilus of left lung ([place])      Interval history-overall patient is tolerating Alimta well.  He has been getting B12 shots every 9 weeks per protocol.  He reports better energy after B12 shots and would like it every 6 weeks if possible.  He has been keeping up with his fluid intake and has not gone back to taking meloxicam.  ECOG PS- 0 Pain scale- 0   Review of systems- Review of Systems  Constitutional: Positive for malaise/fatigue. Negative for chills, fever and weight loss.  HENT: Negative for congestion, ear discharge and nosebleeds.   Eyes: Negative for blurred vision.  Respiratory: Negative for cough, hemoptysis, sputum production, shortness of breath and wheezing.   Cardiovascular: Negative for chest pain, palpitations, orthopnea and claudication.  Gastrointestinal: Negative for  abdominal pain, blood in stool, constipation, diarrhea, heartburn, melena, nausea and vomiting.  Genitourinary: Negative for dysuria, flank pain, frequency, hematuria and urgency.  Musculoskeletal: Negative for back pain, joint pain and myalgias.  Skin: Negative for rash.  Neurological: Negative for dizziness, tingling, focal weakness, seizures, weakness and headaches.  Endo/Heme/Allergies: Does not bruise/bleed easily.  Psychiatric/Behavioral: Negative for depression and suicidal ideas. The patient does not have insomnia.      Allergies  Allergen Reactions  . Penicillins     Reaction and severity unknown     Past Medical History:  Diagnosis Date  . Allergy   . Lung cancer ([place])   . Mild valvular heart disease Nov. 2014   per 2 D Echocardiogram done for persistent leg edema, monitored by cardiology  . Peripheral edema      Past Surgical History:  Procedure Laterality Date  . ELBOW SURGERY    . Left anterior thoracotomy with biopsy  March 2012  . PORTACATH PLACEMENT  March 2012  . TONSILLECTOMY      Social History   Socioeconomic History  . Marital status: Married    Spouse name: Not on file  . Number of children: Not on file  . Years of education: Not on file  . Highest education level: Not on file  Occupational History  . Not on file  Social Needs  . Financial resource strain: Not on file  . Food insecurity    Worry: Not on file    Inability: Not on file  . Transportation needs    Medical: Not on file    Non-medical: Not on file  Tobacco Use  . Smoking status: Never  Smoker  . Smokeless tobacco: Never Used  Substance and Sexual Activity  . Alcohol use: Yes    Alcohol/week: 0.0 standard drinks    Comment: "social drinker"   . Drug use: No  . Sexual activity: Yes  Lifestyle  . Physical activity    Days per week: Not on file    Minutes per session: Not on file  . Stress: Not on file  Relationships  . Social Herbalist on phone: Not on file     Gets together: Not on file    Attends religious service: Not on file    Active member of club or organization: Not on file    Attends meetings of clubs or organizations: Not on file    Relationship status: Not on file  . Intimate partner violence    Fear of current or ex partner: Not on file    Emotionally abused: Not on file    Physically abused: Not on file    Forced sexual activity: Not on file  Other Topics Concern  . Not on file  Social History Narrative  . Not on file    Family History  Problem Relation Age of Onset  . Leukemia Father   . Diabetes Mother      Current Outpatient Medications:  .  folic acid (FOLVITE) 1 MG tablet, Take 1 tablet (1 mg total) by mouth daily., Disp: 90 tablet, Rfl: 3 .  furosemide (LASIX) 20 MG tablet, TAKE 1 TABLET (20 MG TOTAL) BY MOUTH DAILY AS NEEDED FOR SWELLING, Disp: 90 tablet, Rfl: 1 .  loratadine (CLARITIN) 10 MG tablet, Take 1 tablet (10 mg total) by mouth daily., Disp: , Rfl:  .  meloxicam (MOBIC) 7.5 MG tablet, Take 7.5 mg by mouth daily. , Disp: , Rfl:  .  Multiple Vitamin (MULTIVITAMIN) capsule, Take 1 capsule by mouth daily. Reported on 02/24/2016, Disp: , Rfl:  .  Omega-3 Fatty Acids (FISH OIL) 1000 MG CAPS, Take 1 capsule by mouth daily. Reported on 02/24/2016, Disp: , Rfl:  .  senna (SENOKOT) 8.6 MG tablet, Take 1 tablet by mouth daily. Take as needed around time of chemotherapy, Disp: , Rfl:  .  spironolactone (ALDACTONE) 25 MG tablet, TAKE 1 TABLET BY MOUTH EVERY DAY, Disp: 90 tablet, Rfl: 3 .  tadalafil (CIALIS) 20 MG tablet, TAKE 1 TABLET BY MOUTH EVERY 2 DAYS AS NEEDED., Disp: 6 tablet, Rfl: 1 .  vitamin B-12 (CYANOCOBALAMIN) 1000 MCG tablet, Take 1,000 mcg by mouth daily., Disp: , Rfl:  No current facility-administered medications for this visit.   Facility-Administered Medications Ordered in Other Visits:  .  sodium chloride 0.9 % injection 10 mL, 10 mL, Intracatheter, PRN, [name], MD, 10 mL at 05/27/15 1543 .   sodium chloride 0.9 % injection 10 mL, 10 mL, Intracatheter, PRN, [name], MD, 10 mL at 06/17/15 1510 .  sodium chloride 0.9 % injection 10 mL, 10 mL, Intravenous, PRN, Choksi, Janak, MD, 10 mL at 07/08/15 1340  Physical exam:  Vitals:   09/21/19 1335  BP: 134/85  Pulse: 85  Temp: (!) 97 F (36.1 C)  TempSrc: Tympanic  Weight: 217 lb (98.4 kg)  Height: '5\' 10"'  (1.778 m)   Physical Exam Constitutional:      General: He is not in acute distress. HENT:     Head: Normocephalic and atraumatic.  Eyes:     Pupils: Pupils are equal, round, and reactive to light.  Neck:     Musculoskeletal:  Normal range of motion.  Cardiovascular:     Rate and Rhythm: Normal rate and regular rhythm.     Heart sounds: Normal heart sounds.  Pulmonary:     Effort: Pulmonary effort is normal.     Breath sounds: Normal breath sounds.  Abdominal:     General: Bowel sounds are normal.     Palpations: Abdomen is soft.  Musculoskeletal:     Comments: Trace bilateral pitting edema  Skin:    General: Skin is warm and dry.  Neurological:     Mental Status: He is alert and oriented to person, place, and time.      CMP Latest Ref Rng & Units 09/21/2019  Glucose 70 - 99 mg/dL 201(H)  BUN 6 - 20 mg/dL 20  Creatinine 0.61 - 1.24 mg/dL 1.07  Sodium 135 - 145 mmol/L 136  Potassium 3.5 - 5.1 mmol/L 3.8  Chloride 98 - 111 mmol/L 103  CO2 22 - 32 mmol/L 24  Calcium 8.9 - 10.3 mg/dL 8.8(L)  Total Protein 6.5 - 8.1 g/dL 6.9  Total Bilirubin 0.3 - 1.2 mg/dL 0.5  Alkaline Phos 38 - 126 U/L 40  AST 15 - 41 U/L 23  ALT 0 - 44 U/L 17   CBC Latest Ref Rng & Units 09/21/2019  WBC 4.0 - 10.5 K/uL 5.0  Hemoglobin 13.0 - 17.0 g/dL 13.7  Hematocrit 39.0 - 52.0 % 39.9  Platelets 150 - 400 K/uL 162      Assessment and plan- Patient is a 58 y.o. male with stage IV lung cancer currently NED here for next cycle of maintenance Alimta  Plan will be to proceed with next cycle of maintenance Alimta today.  He  will see Dr. Lynett Fish back in 3 weeks time with CBC with differential, CMP for the next cycle.  Patient would like to go back to getting treatments on Friday and therefore his next treatment will be on 10/16/2019.  Further scans can be decided by Dr. Rogue Bussing at the next visit  Patient has been getting B12 shots every 9 weeks per protocol and would like it every 6 weeks if possible for symptomatic relief.  I will add a B12 level to his next visit.  He is due for his B12 shot at next visit as well.  AKI: Resolved patient is off meloxicam at this time   Visit Diagnosis 1. Cancer of hilus of left lung ([place])   2. Encounter for antineoplastic chemotherapy      Dr. Randa Evens, MD, MPH [facility] at [facility] [phone] 09/21/2019 3:47 PM

## 2019-09-21 NOTE — Progress Notes (Signed)
Patient stated that he had been doing well.

## 2019-09-21 NOTE — Research (Signed)
Patient Mitchell Mosley presented to the cancer center for his cycle #645  MD visit assessment & infusion on the [employer] research study.  Patient reports he continues to experience generalized aches and pains due to not taking Mobic, and he has been seeing a Restaurant manager, fast food. He also continues to report increased muscle cramping in ble at the bend of his hips, left side more than right. Dr. Rogue Bussing previously told him it is most likely due to muscle fatigue. Patient still has some mild edema in his BLE, mostly in his ankles and reports he is still taking both Lasix and Spironolactone daily to control the swelling. He reports no changes in this or the odd itching in his palms, the bottoms of his feet sporadically and in his right upper inner arm, and reports using generic skin lotion as needed to manage this. [name] states he is still experiencing some fatigue that is mild and lasts just a few days after he receives Alimta infusion. He is working full-time and Arboriculturist daily, and states it will be difficult for him this week due to having to work through the fatigue. [name] is aware and was previously informed by this research nurse and Dr. Rogue Bussing that we were notified that the [facility] S130 study is going to close in December, 2020, and there was some discussion about his last projected study visit/infusion being due on 10/30/2019. We were informed today that 3 vials of his study drug will expire on 10/03/2019. He will use 2 of the vials today, but there will only be enough left for one more dose, therefore his next visit will be the last study visit for [name]. Patient originally requested for his next visit to be moved out to 10/16/2019, but then changed his mind and requested to come on in and have his treatment on 10/09/2019. Dr. Janese Banks in to see patient today for H&P. He will proceed with Alimta infusion at 1000mg  today as scheduled. Vitamin B12 injection is due next visit and Dr. Janese Banks  plans to add a vitamin B12 level to his labs at that time. Dr. Rogue Bussing to schedule follow up CT scan at next patient visit. Sodium is normal today at 154mmol/L and platelets are up to 162,000. Hbg improved at 13.7 today. CBC and chemistry values remain within acceptable parameters for patient to receive his infusion as scheduled. Mr [name] is aware that he can call the research office if he has any questions or concerns prior to his next visit. Will continue study visits per S130 protocol until the current inventory of Alimta is exhausted, and transfer patient to commercially available Alimta at that time. Current and ongoing adverse events with grade and attribution are as follows:   Adverse Event Log Study/Protocol: [name] S130 Cycle: 644 Event Grade Onset Date Resolved Date Drug Name Pemetrexed Attribution Treatment Comments  Hyponatremia Grade 1 08/28/2019 02/20/2019 10/17/18 09/21/2019 03/13/2019 11/07/18  Unlikely  Sodium up to 136 today  Cough Grade 1 09/19/18 10/17/18  Unlikely    Nasal congestion Grade 1 09/19/18 10/17/18  Unlikely    Elevated serum creatinine Grade 1 08/07/2019 10/17/18 06/16/18 07/25/18 08/28/2019 11/07/18 07/04/18 09/05/18  Possible Increase po fluid intake Serum creatinine is 1.18 today  Edema (mild) Grade 1  12/06/2017   Possible  Trace in right and 1+ edema in left ankle  Fatigue Grade1 06/27/2019  01/14/16  03/04/2019  Possible    Tendonitis Grade 1  09/21/16   Unrelated  Meloxicam  Hypocalcemia Grade 1 05/26/18 06/16/18  Possible  Resolved, Calcium level WNL  Hyperglycemia Grade 1 05/26/18 11/09/15  09/08/16  Unlikely  Non-fasting glucose was 179mg /dL  Thrombocytopenia Grade 1 08/28/2019 05/15/2019 09/26/18 07/04/18 05/26/18 12/06/17 08/02/17 09/21/2019 07/17/2019 11/27/18 07/25/18 06/16/18 08/02/17  08/23/17  Possible  Platelet count 162,000 today  Nausea Grade 1 11/16/17 11/17/17  Unrelated  R/T antibiotic   Abrasions on Right knuckles Grade 1 02/03/18 02/28/18  Unrelated  r/t Dog walking accident  Pruritis Grade 1  04/19/17   Possible Uses otc lotion with relief deep itching under the skin of palms, feet & RUE  Elevated cholesterol Grade 1 03/15/14   Unrelated  No ongoing monitoring available  Valvular Heart Disease Grade 1 10/19/13   Unrelated      Erectile Dysfunction  Grade 2 12/11/15   Possible Cialis   Skin hyperpigmentation Grade 1 07/08/15   Unrelated  Medial aspect of feel bil  Aneurysm of Ascending Aorta Grade 1 10/14/13   Unrelated  mentioned on CT scans  Possible arthritis of Left Knee Grade 1 05/06/15   Unrelated    Elevated BUN Grade 1 05/15/2019 09/26/18 07/25/18 09/21/2019 10/17/18   08/18/18  Possible    GI upset Grade 1 06/27/2019 07/23/2019  Possible  Resolved with pro-biotics  Anemia Grade 1 08/07/2019 09/21/2019  Possible  Hgb 13.7 today  Myalgia Grade 1 08/28/2019   Unlikely  Muscle cramps in [address] BSN, [place], OCN 09/21/2019 2:15 PM

## 2019-09-22 DIAGNOSIS — M9903 Segmental and somatic dysfunction of lumbar region: Secondary | ICD-10-CM | POA: Diagnosis not present

## 2019-09-22 DIAGNOSIS — M546 Pain in thoracic spine: Secondary | ICD-10-CM | POA: Diagnosis not present

## 2019-09-22 DIAGNOSIS — M9902 Segmental and somatic dysfunction of thoracic region: Secondary | ICD-10-CM | POA: Diagnosis not present

## 2019-09-22 DIAGNOSIS — M545 Low back pain: Secondary | ICD-10-CM | POA: Diagnosis not present

## 2019-09-25 DIAGNOSIS — M546 Pain in thoracic spine: Secondary | ICD-10-CM | POA: Diagnosis not present

## 2019-09-25 DIAGNOSIS — M545 Low back pain: Secondary | ICD-10-CM | POA: Diagnosis not present

## 2019-09-25 DIAGNOSIS — M9902 Segmental and somatic dysfunction of thoracic region: Secondary | ICD-10-CM | POA: Diagnosis not present

## 2019-09-25 DIAGNOSIS — M9903 Segmental and somatic dysfunction of lumbar region: Secondary | ICD-10-CM | POA: Diagnosis not present

## 2019-09-28 DIAGNOSIS — M545 Low back pain: Secondary | ICD-10-CM | POA: Diagnosis not present

## 2019-09-28 DIAGNOSIS — M9902 Segmental and somatic dysfunction of thoracic region: Secondary | ICD-10-CM | POA: Diagnosis not present

## 2019-09-28 DIAGNOSIS — M546 Pain in thoracic spine: Secondary | ICD-10-CM | POA: Diagnosis not present

## 2019-09-28 DIAGNOSIS — M9903 Segmental and somatic dysfunction of lumbar region: Secondary | ICD-10-CM | POA: Diagnosis not present

## 2019-09-30 DIAGNOSIS — M545 Low back pain: Secondary | ICD-10-CM | POA: Diagnosis not present

## 2019-09-30 DIAGNOSIS — M9902 Segmental and somatic dysfunction of thoracic region: Secondary | ICD-10-CM | POA: Diagnosis not present

## 2019-09-30 DIAGNOSIS — M546 Pain in thoracic spine: Secondary | ICD-10-CM | POA: Diagnosis not present

## 2019-09-30 DIAGNOSIS — M9903 Segmental and somatic dysfunction of lumbar region: Secondary | ICD-10-CM | POA: Diagnosis not present

## 2019-10-02 DIAGNOSIS — M546 Pain in thoracic spine: Secondary | ICD-10-CM | POA: Diagnosis not present

## 2019-10-02 DIAGNOSIS — M9903 Segmental and somatic dysfunction of lumbar region: Secondary | ICD-10-CM | POA: Diagnosis not present

## 2019-10-02 DIAGNOSIS — M545 Low back pain: Secondary | ICD-10-CM | POA: Diagnosis not present

## 2019-10-02 DIAGNOSIS — M9902 Segmental and somatic dysfunction of thoracic region: Secondary | ICD-10-CM | POA: Diagnosis not present

## 2019-10-05 DIAGNOSIS — M545 Low back pain: Secondary | ICD-10-CM | POA: Diagnosis not present

## 2019-10-05 DIAGNOSIS — M9903 Segmental and somatic dysfunction of lumbar region: Secondary | ICD-10-CM | POA: Diagnosis not present

## 2019-10-05 DIAGNOSIS — M9902 Segmental and somatic dysfunction of thoracic region: Secondary | ICD-10-CM | POA: Diagnosis not present

## 2019-10-05 DIAGNOSIS — M546 Pain in thoracic spine: Secondary | ICD-10-CM | POA: Diagnosis not present

## 2019-10-06 DIAGNOSIS — M545 Low back pain: Secondary | ICD-10-CM | POA: Diagnosis not present

## 2019-10-06 DIAGNOSIS — M9903 Segmental and somatic dysfunction of lumbar region: Secondary | ICD-10-CM | POA: Diagnosis not present

## 2019-10-06 DIAGNOSIS — M546 Pain in thoracic spine: Secondary | ICD-10-CM | POA: Diagnosis not present

## 2019-10-06 DIAGNOSIS — M9902 Segmental and somatic dysfunction of thoracic region: Secondary | ICD-10-CM | POA: Diagnosis not present

## 2019-10-08 ENCOUNTER — Other Ambulatory Visit: Payer: Self-pay

## 2019-10-08 ENCOUNTER — Encounter: Payer: Self-pay | Admitting: Internal Medicine

## 2019-10-09 ENCOUNTER — Other Ambulatory Visit: Payer: Self-pay

## 2019-10-09 ENCOUNTER — Inpatient Hospital Stay: Payer: BC Managed Care – PPO | Attending: Internal Medicine

## 2019-10-09 ENCOUNTER — Inpatient Hospital Stay: Payer: BC Managed Care – PPO

## 2019-10-09 ENCOUNTER — Encounter: Payer: Self-pay | Admitting: Internal Medicine

## 2019-10-09 ENCOUNTER — Encounter: Payer: Self-pay | Admitting: *Deleted

## 2019-10-09 ENCOUNTER — Inpatient Hospital Stay (HOSPITAL_BASED_OUTPATIENT_CLINIC_OR_DEPARTMENT_OTHER): Payer: BC Managed Care – PPO | Admitting: Internal Medicine

## 2019-10-09 DIAGNOSIS — Z79899 Other long term (current) drug therapy: Secondary | ICD-10-CM | POA: Insufficient documentation

## 2019-10-09 DIAGNOSIS — Z23 Encounter for immunization: Secondary | ICD-10-CM | POA: Insufficient documentation

## 2019-10-09 DIAGNOSIS — M9903 Segmental and somatic dysfunction of lumbar region: Secondary | ICD-10-CM | POA: Diagnosis not present

## 2019-10-09 DIAGNOSIS — M2550 Pain in unspecified joint: Secondary | ICD-10-CM | POA: Insufficient documentation

## 2019-10-09 DIAGNOSIS — R944 Abnormal results of kidney function studies: Secondary | ICD-10-CM | POA: Insufficient documentation

## 2019-10-09 DIAGNOSIS — C3402 Malignant neoplasm of left main bronchus: Secondary | ICD-10-CM

## 2019-10-09 DIAGNOSIS — Z5111 Encounter for antineoplastic chemotherapy: Secondary | ICD-10-CM | POA: Insufficient documentation

## 2019-10-09 DIAGNOSIS — Z006 Encounter for examination for normal comparison and control in clinical research program: Secondary | ICD-10-CM | POA: Diagnosis not present

## 2019-10-09 DIAGNOSIS — Z95828 Presence of other vascular implants and grafts: Secondary | ICD-10-CM

## 2019-10-09 DIAGNOSIS — M546 Pain in thoracic spine: Secondary | ICD-10-CM | POA: Diagnosis not present

## 2019-10-09 DIAGNOSIS — R918 Other nonspecific abnormal finding of lung field: Secondary | ICD-10-CM | POA: Insufficient documentation

## 2019-10-09 DIAGNOSIS — M7989 Other specified soft tissue disorders: Secondary | ICD-10-CM | POA: Diagnosis not present

## 2019-10-09 DIAGNOSIS — K219 Gastro-esophageal reflux disease without esophagitis: Secondary | ICD-10-CM | POA: Diagnosis not present

## 2019-10-09 DIAGNOSIS — M545 Low back pain: Secondary | ICD-10-CM | POA: Diagnosis not present

## 2019-10-09 DIAGNOSIS — M9902 Segmental and somatic dysfunction of thoracic region: Secondary | ICD-10-CM | POA: Diagnosis not present

## 2019-10-09 LAB — COMPREHENSIVE METABOLIC PANEL
ALT: 17 U/L (ref 0–44)
AST: 21 U/L (ref 15–41)
Albumin: 4.1 g/dL (ref 3.5–5.0)
Alkaline Phosphatase: 47 U/L (ref 38–126)
Anion gap: 11 (ref 5–15)
BUN: 25 mg/dL — ABNORMAL HIGH (ref 6–20)
CO2: 22 mmol/L (ref 22–32)
Calcium: 8.8 mg/dL — ABNORMAL LOW (ref 8.9–10.3)
Chloride: 101 mmol/L (ref 98–111)
Creatinine, Ser: 1.53 mg/dL — ABNORMAL HIGH (ref 0.61–1.24)
GFR calc Af Amer: 58 mL/min — ABNORMAL LOW (ref >60)
GFR calc non Af Amer: 50 mL/min — ABNORMAL LOW (ref >60)
Glucose, Bld: 166 mg/dL — ABNORMAL HIGH (ref 70–99)
Potassium: 3.8 mmol/L (ref 3.5–5.1)
Sodium: 134 mmol/L — ABNORMAL LOW (ref 135–145)
Total Bilirubin: 0.6 mg/dL (ref 0.3–1.2)
Total Protein: 7.6 g/dL (ref 6.5–8.1)

## 2019-10-09 LAB — CBC WITH DIFFERENTIAL/PLATELET
Abs Immature Granulocytes: 0.02 10*3/uL (ref 0.00–0.07)
Basophils Absolute: 0 10*3/uL (ref 0.0–0.1)
Basophils Relative: 1 %
Eosinophils Absolute: 0.1 10*3/uL (ref 0.0–0.5)
Eosinophils Relative: 2 %
HCT: 41 % (ref 39.0–52.0)
Hemoglobin: 14.1 g/dL (ref 13.0–17.0)
Immature Granulocytes: 0 %
Lymphocytes Relative: 29 %
Lymphs Abs: 1.7 10*3/uL (ref 0.7–4.0)
MCH: 32.2 pg (ref 26.0–34.0)
MCHC: 34.4 g/dL (ref 30.0–36.0)
MCV: 93.6 fL (ref 80.0–100.0)
Monocytes Absolute: 0.6 10*3/uL (ref 0.1–1.0)
Monocytes Relative: 11 %
Neutro Abs: 3.5 10*3/uL (ref 1.7–7.7)
Neutrophils Relative %: 57 %
Platelets: 157 10*3/uL (ref 150–400)
RBC: 4.38 MIL/uL (ref 4.22–5.81)
RDW: 12.4 % (ref 11.5–15.5)
WBC: 5.9 10*3/uL (ref 4.0–10.5)
nRBC: 0 % (ref 0.0–0.2)

## 2019-10-09 LAB — VITAMIN B12: Vitamin B-12: 2433 pg/mL — ABNORMAL HIGH (ref 180–914)

## 2019-10-09 MED ORDER — SODIUM CHLORIDE 0.9 % IV SOLN
Freq: Once | INTRAVENOUS | Status: AC
  Administered 2019-10-09: 15:00:00 via INTRAVENOUS
  Filled 2019-10-09: qty 250

## 2019-10-09 MED ORDER — INFLUENZA VAC SPLIT QUAD 0.5 ML IM SUSY
0.5000 mL | PREFILLED_SYRINGE | Freq: Once | INTRAMUSCULAR | Status: AC
  Administered 2019-10-09: 0.5 mL via INTRAMUSCULAR
  Filled 2019-10-09: qty 0.5

## 2019-10-09 MED ORDER — HEPARIN SOD (PORK) LOCK FLUSH 100 UNIT/ML IV SOLN
500.0000 [IU] | Freq: Once | INTRAVENOUS | Status: AC | PRN
  Administered 2019-10-09: 500 [IU]
  Filled 2019-10-09: qty 5

## 2019-10-09 MED ORDER — SODIUM CHLORIDE 0.9% FLUSH
10.0000 mL | Freq: Once | INTRAVENOUS | Status: AC
  Administered 2019-10-09: 10 mL via INTRAVENOUS
  Filled 2019-10-09: qty 10

## 2019-10-09 MED ORDER — CYANOCOBALAMIN 1000 MCG/ML IJ SOLN
1000.0000 ug | Freq: Once | INTRAMUSCULAR | Status: AC
  Administered 2019-10-09: 1000 ug via INTRAMUSCULAR
  Filled 2019-10-09: qty 1

## 2019-10-09 MED ORDER — SODIUM CHLORIDE 0.9 % IV SOLN
1000.0000 mg | Freq: Once | INTRAVENOUS | Status: AC
  Administered 2019-10-09: 1000 mg via INTRAVENOUS
  Filled 2019-10-09: qty 40

## 2019-10-09 MED ORDER — SODIUM CHLORIDE 0.9 % IV SOLN
Freq: Once | INTRAVENOUS | Status: AC
  Administered 2019-10-09: 15:00:00 via INTRAVENOUS
  Filled 2019-10-09: qty 4

## 2019-10-09 NOTE — Research (Signed)
Patient Mitchell Mosley presented to the cancer center for his cycle #646  MD visit assessment & infusion on the [employer] research study.  Patient reports he continues to experience generalized aches and pains due to not taking Mobic or Tylenol,  and he has been seeing a chiropractor off and on for this. He also continues to report increased muscle cramping in ble at the bend of his hips, left side more than right. Today, he reports an episode last week of epigastric pain with spontaneous onset aggravated by spicy foods, then also with a spontaneous resolution. States he did not take anything for this, but Dr. Rogue Bussing instructed him to take OTC TUMS or Prilosec or Nexium if this occurs again or becomes bothersome. Patient still has some mild edema in his BLE, mostly in his ankles and reports he is still taking both Lasix and Spironolactone daily to control the swelling. He reports no changes in this or the odd itching in his palms, the bottoms of his feet sporadically and in his right upper inner arm, and reports using generic skin lotion as needed to manage this. Mitchell Mosley reports experiencing some fatigue that is mild and lasts just a few days after he receives Alimta infusion. 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 Mitchell Mosley is aware and was previously informed by this research nurse and Dr. Rogue Bussing that we were notified that the [facility] S130 study is going to close in December, 2020, and there was some discussion about his last projected study visit/infusion being due on 10/30/2019. We were informed that 3 vials of his study drug would expire on 10/03/2019; therefore today is the last study visit for Mitchell Mosley. Dr.Brahmanday conducted his visit assessment remotely today. The patient will proceed with Alimta infusion at 1000mg  today as scheduled, then transition to commercially available Alimta at his next infusion visit on 11/06/2019.  Vitamin B12 injection is due today and a  vitamin B12 level was also drawn with regular labs. Patient will also receive his influenza vaccine today. Dr. Rogue Bussing has scheduled a follow up CT scan on 11/02/2019 - prior to patient's next clinic visit. Serum creatinine level and BUN are elevated again at 1.53 and 25 respectively. Sodium is a little low today at 154mmol/L and platelets are wnl at 157,000. Hbg remains improved at 14.1 today. CBC and chemistry values remain within acceptable parameters for patient to receive his infusion as scheduled. Mitchell Mosley is aware that he can call the research office if he has any questions or concerns prior to his next visit. Will plan to transfer patient to commercially available Alimta after today's infusion. Current and ongoing adverse events with grade and attribution are as follows:   Adverse Event Log Study/Protocol: [name] S130 Cycle: 150 Event Grade Onset Date Resolved Date Drug Name Pemetrexed Attribution Treatment Comments  Hyponatremia Grade 1 10/09/2019 08/28/2019 02/20/2019 10/17/18  09/21/2019 03/13/2019 11/07/18  Unlikely  Sodium down to 134 today  Cough Grade 1 09/19/18 10/17/18  Unlikely    Nasal congestion Grade 1 09/19/18 10/17/18  Unlikely    Elevated serum creatinine Grade 1 11/06/202009/03/2019 10/17/18 06/16/18 07/25/18  08/28/2019 11/07/18 07/04/18 09/05/18  Possible Increase po fluid intake Serum creatinine is 1.53 today  Edema (mild) Grade 1  12/06/2017   Possible  Trace in right and 1+ edema in left ankle  Fatigue Grade1 06/27/2019  01/14/16  03/04/2019  Possible    Tendonitis Grade 1  09/21/16   Unrelated  Meloxicam  Hypocalcemia Grade 1 05/26/18 06/16/18  Possible  Resolved, Calcium level WNL  Hyperglycemia Grade 1 05/26/18 11/09/15  09/08/16  Unlikely  Non-fasting glucose was 179mg /dL  Thrombocytopenia Grade 1 08/28/2019 05/15/2019 09/26/18 07/04/18 05/26/18 12/06/17 08/02/17 09/21/2019 07/17/2019 11/27/18 07/25/18 06/16/18  08/02/17  08/23/17  Possible  Platelet count 162,000 today  Nausea Grade 1 11/16/17 11/17/17  Unrelated  R/T antibiotic  Abrasions on Right knuckles Grade 1 02/03/18 02/28/18  Unrelated  r/t Dog walking accident  Pruritis Grade 1  04/19/17   Possible Uses otc lotion with relief deep itching under the skin of palms, feet & RUE  Elevated cholesterol Grade 1 03/15/14   Unrelated  No ongoing monitoring available  Valvular Heart Disease Grade 1 10/19/13   Unrelated      Erectile Dysfunction  Grade 2 12/11/15   Possible Cialis   Skin hyperpigmentation Grade 1 07/08/15   Unrelated  Medial aspect of feel bil  Aneurysm of Ascending Aorta Grade 1 10/14/13   Unrelated  mentioned on CT scans  Possible arthritis of Left Knee Grade 1 05/06/15   Unrelated    Elevated BUN Grade 1 05/15/2019 09/26/18 07/25/18 09/21/2019 10/17/18   08/18/18  Possible    GI upset Grade 1 06/27/2019 07/23/2019  Possible  Resolved with pro-biotics  Anemia Grade 1 08/07/2019 09/21/2019  Possible  Hgb 13.7 today  Myalgia Grade 1 08/28/2019   Unlikely  Muscle cramps in BLE  Heartburn/Reflux Grade 1 10/01/2019 10/02/2019  Unlikely  Resolved spontaneously  Yolande Jolly, BSN, MHA, OCN 10/09/2019 2:57 PM

## 2019-10-09 NOTE — Progress Notes (Signed)
I connected with Mitchell Mosley on 10/09/19 at  2:15 PM EST by video enabled telemedicine visit and verified that I am speaking with the correct person using two identifiers.  I discussed the limitations, risks, security and privacy concerns of performing an evaluation and management service by telemedicine and the availability of in-person appointments. I also discussed with the patient that there may be a patient responsible charge related to this service. The patient expressed understanding and agreed to proceed.    Other persons participating in the visit and their role in the encounter: RN/medical reconciliation Patient's location: Office Provider's location: Home  Oncology History Overview Note  # 2012- METASTATIC ADENO CA of LEFT LUNG [acinar pattern] s/p MED LN Bx [NEG- EGFR/K-ras/ALK mutation];[name]; [name]; CT JULY  2016- NED; JAN 2018- NED;   # AUG 2020-clinical trial closed; continue maintenance Alimta  # Thoracic Aneurysm [4cm- stable]  ----------------------------------------------------------    DIAGNOSIS: Mitchell Mosley.Mor ] Adenocarcinoma lung  STAGE: 4       ;GOALS: Palliative  CURRENT/MOST RECENT THERAPY _0  Alimta maintenance    Cancer of hilus of left lung ([place])  07/20/2016 Initial Diagnosis   Cancer of hilus of left lung ([place])     Chief Complaint: Lung cancer   History of present illness:Mitchell Mosley [age] y.o.  male with history of metastatic lung cancer currently NED on maintenance Alimta is here for follow-up.  Patient denies any unusual shortness of breath or cough.  Denies any fevers or chills.  He noted to have episode of heartburn especially with spicy food couple weeks ago.  This can resolve.  Denies any worsening of his chronic joint pains or tendinitis of his wrist.  Chronic mild swelling in the legs.  Patient currently stopped taking his  NSAIDs.  Observation/objective: See below  Assessment and plan: Cancer of hilus of left lung ([place]) # Adenocarcinoma the lung; stage IV; June 6th  2020-CT scan chest/a/p-shows no evidence of metastatic disease; 14m -LLL/ new-likely inflammatory.  Stable right upper lobe 3 mm nodule.  STABLE.   # Proceed with Alimta today; Labs today reviewed;  acceptable for treatment today. Will order CT scan prior to next visit.     #Creatinine 1.5/GFR 50; baseline GFR 60.  Monitor closely recommend hydration; patient not taking meloxicam.  Recommend extra hydration around the time of the CT scan.  # Reflux-likely secondary to dietary.  If worse recommend Tums/PPI.  # Chronic mild joint pain -stable.  Not on any NSAIDs.  # Flu shot today.   # DISPOSITION:  # Alimta today # flu shot today # follow up in 4 weeks- MD/ labs- cbc/cmp/Alimta; CT scan prior [Nov 30th] Dr.B  Follow-up instructions:  I discussed the assessment and treatment plan with the patient.  The patient was provided an opportunity to ask questions and all were answered.  The patient agreed with the plan and demonstrated understanding of instructions.  The patient was advised to call back or seek an in person evaluation if the symptoms worsen or if the condition fails to improve as anticipated.   Dr. GCharlaine DaltonCGrand Coteauat [facility]11/05/2019 2:32 PM

## 2019-10-09 NOTE — Assessment & Plan Note (Addendum)
#   Adenocarcinoma the lung; stage IV; June 6th  2020-CT scan chest/a/p-shows no evidence of metastatic disease; 50mm -LLL/ new-likely inflammatory.  Stable right upper lobe 3 mm nodule.  STABLE.   # Proceed with Alimta today; Labs today reviewed;  acceptable for treatment today. Will order CT scan prior to next visit.     #Creatinine 1.5/GFR 50; baseline GFR 60.  Monitor closely recommend hydration; patient not taking meloxicam.  Recommend extra hydration around the time of the CT scan.  # Reflux-likely secondary to dietary.  If worse recommend Tums/PPI.  # Chronic mild joint pain -stable.  Not on any NSAIDs.  # Flu shot today.   # DISPOSITION:  # Alimta today # flu shot today # follow up in 4 weeks- MD/ labs- cbc/cmp/Alimta; CT scan prior [Nov 30th] Dr.B

## 2019-10-12 DIAGNOSIS — M9902 Segmental and somatic dysfunction of thoracic region: Secondary | ICD-10-CM | POA: Diagnosis not present

## 2019-10-12 DIAGNOSIS — M546 Pain in thoracic spine: Secondary | ICD-10-CM | POA: Diagnosis not present

## 2019-10-12 DIAGNOSIS — M9903 Segmental and somatic dysfunction of lumbar region: Secondary | ICD-10-CM | POA: Diagnosis not present

## 2019-10-12 DIAGNOSIS — M545 Low back pain: Secondary | ICD-10-CM | POA: Diagnosis not present

## 2019-10-14 DIAGNOSIS — M545 Low back pain: Secondary | ICD-10-CM | POA: Diagnosis not present

## 2019-10-14 DIAGNOSIS — M9902 Segmental and somatic dysfunction of thoracic region: Secondary | ICD-10-CM | POA: Diagnosis not present

## 2019-10-14 DIAGNOSIS — M546 Pain in thoracic spine: Secondary | ICD-10-CM | POA: Diagnosis not present

## 2019-10-14 DIAGNOSIS — M9903 Segmental and somatic dysfunction of lumbar region: Secondary | ICD-10-CM | POA: Diagnosis not present

## 2019-10-16 ENCOUNTER — Ambulatory Visit: Payer: BC Managed Care – PPO

## 2019-10-16 ENCOUNTER — Ambulatory Visit: Payer: BC Managed Care – PPO | Admitting: Internal Medicine

## 2019-10-16 ENCOUNTER — Other Ambulatory Visit: Payer: BC Managed Care – PPO

## 2019-10-16 DIAGNOSIS — M545 Low back pain: Secondary | ICD-10-CM | POA: Diagnosis not present

## 2019-10-16 DIAGNOSIS — M9902 Segmental and somatic dysfunction of thoracic region: Secondary | ICD-10-CM | POA: Diagnosis not present

## 2019-10-16 DIAGNOSIS — M9903 Segmental and somatic dysfunction of lumbar region: Secondary | ICD-10-CM | POA: Diagnosis not present

## 2019-10-16 DIAGNOSIS — M546 Pain in thoracic spine: Secondary | ICD-10-CM | POA: Diagnosis not present

## 2019-10-19 ENCOUNTER — Other Ambulatory Visit: Payer: Self-pay | Admitting: Internal Medicine

## 2019-10-19 DIAGNOSIS — Z7189 Other specified counseling: Secondary | ICD-10-CM | POA: Insufficient documentation

## 2019-10-19 NOTE — Progress Notes (Signed)
START ON PATHWAY REGIMEN - Non-Small Cell Lung     A cycle is every 21 days:     Pemetrexed   **Always confirm dose/schedule in your pharmacy ordering system**  Patient Characteristics: Stage IV Metastatic, Nonsquamous, Maintenance - Chemotherapy/Immunotherapy, PS = 0, 1, Initial Pemetrexed + Platinum Agent AJCC T Category: T3 Current Disease Status: Distant Metastases AJCC N Category: N2 AJCC M Category: M1b AJCC 8 Stage Grouping: IVA Histology: Nonsquamous Cell ROS1 Rearrangement Status: Awaiting Test Results T790M Mutation Status: Not Applicable - EGFR Mutation Negative/Unknown Other Mutations/Biomarkers: No Other Actionable Mutations Chemotherapy/Immunotherapy LOT: Maintenance Chemotherapy/Immunotherapy Molecular Targeted Therapy: Not Appropriate MET Exon 14 Mutation Status: Awaiting Test Results RET Gene Fusion Status: Awaiting Test Results EGFR Mutation Status: Awaiting Test Results NTRK Gene Fusion Status: Awaiting Test Results PD-L1 Expression Status: Awaiting Test Results ALK Rearrangement Status: Awaiting Test Results BRAF V600E Mutation Status: Awaiting Test Results ECOG Performance Status: 0 Intent of Therapy: Non-Curative / Palliative Intent, Discussed with Patient

## 2019-10-21 DIAGNOSIS — M546 Pain in thoracic spine: Secondary | ICD-10-CM | POA: Diagnosis not present

## 2019-10-21 DIAGNOSIS — M545 Low back pain: Secondary | ICD-10-CM | POA: Diagnosis not present

## 2019-10-21 DIAGNOSIS — M9903 Segmental and somatic dysfunction of lumbar region: Secondary | ICD-10-CM | POA: Diagnosis not present

## 2019-10-21 DIAGNOSIS — M9902 Segmental and somatic dysfunction of thoracic region: Secondary | ICD-10-CM | POA: Diagnosis not present

## 2019-10-23 DIAGNOSIS — M545 Low back pain: Secondary | ICD-10-CM | POA: Diagnosis not present

## 2019-10-23 DIAGNOSIS — M9902 Segmental and somatic dysfunction of thoracic region: Secondary | ICD-10-CM | POA: Diagnosis not present

## 2019-10-23 DIAGNOSIS — M546 Pain in thoracic spine: Secondary | ICD-10-CM | POA: Diagnosis not present

## 2019-10-23 DIAGNOSIS — M9903 Segmental and somatic dysfunction of lumbar region: Secondary | ICD-10-CM | POA: Diagnosis not present

## 2019-10-26 DIAGNOSIS — M9903 Segmental and somatic dysfunction of lumbar region: Secondary | ICD-10-CM | POA: Diagnosis not present

## 2019-10-26 DIAGNOSIS — M545 Low back pain: Secondary | ICD-10-CM | POA: Diagnosis not present

## 2019-10-26 DIAGNOSIS — M546 Pain in thoracic spine: Secondary | ICD-10-CM | POA: Diagnosis not present

## 2019-10-26 DIAGNOSIS — M9902 Segmental and somatic dysfunction of thoracic region: Secondary | ICD-10-CM | POA: Diagnosis not present

## 2019-10-28 DIAGNOSIS — M545 Low back pain: Secondary | ICD-10-CM | POA: Diagnosis not present

## 2019-10-28 DIAGNOSIS — M9902 Segmental and somatic dysfunction of thoracic region: Secondary | ICD-10-CM | POA: Diagnosis not present

## 2019-10-28 DIAGNOSIS — M546 Pain in thoracic spine: Secondary | ICD-10-CM | POA: Diagnosis not present

## 2019-10-28 DIAGNOSIS — M9903 Segmental and somatic dysfunction of lumbar region: Secondary | ICD-10-CM | POA: Diagnosis not present

## 2019-11-02 ENCOUNTER — Ambulatory Visit
Admission: RE | Admit: 2019-11-02 | Discharge: 2019-11-02 | Disposition: A | Discharge status: Home / Self Care | Payer: BC Managed Care – PPO | Source: Ambulatory Visit | Attending: Internal Medicine | Admitting: Internal Medicine

## 2019-11-02 ENCOUNTER — Other Ambulatory Visit: Payer: Self-pay

## 2019-11-02 DIAGNOSIS — I712 Thoracic aortic aneurysm, without rupture: Secondary | ICD-10-CM | POA: Diagnosis not present

## 2019-11-02 DIAGNOSIS — C3402 Malignant neoplasm of left main bronchus: Secondary | ICD-10-CM | POA: Diagnosis not present

## 2019-11-02 DIAGNOSIS — Z85118 Personal history of other malignant neoplasm of bronchus and lung: Secondary | ICD-10-CM | POA: Diagnosis not present

## 2019-11-02 MED ORDER — IOHEXOL 300 MG/ML  SOLN
100.0000 mL | Freq: Once | INTRAMUSCULAR | Status: AC | PRN
  Administered 2019-11-02: 100 mL via INTRAVENOUS

## 2019-11-03 ENCOUNTER — Telehealth: Payer: Self-pay | Admitting: *Deleted

## 2019-11-03 DIAGNOSIS — Z20828 Contact with and (suspected) exposure to other viral communicable diseases: Secondary | ICD-10-CM

## 2019-11-03 DIAGNOSIS — Z20822 Contact with and (suspected) exposure to covid-19: Secondary | ICD-10-CM

## 2019-11-03 NOTE — Telephone Encounter (Signed)
Received guidance from Dr. Rogue Bussing and Renita Papa, RN that Mr. Rajewski will not receive his maintenance Alimta infusion this week as originally planned, and that he cannot come into the clinic due to his potential exposure to COVID-19. The scheduled MD visit on 11/06/19 will be converted to a video visit with Dr. Rogue Bussing to discuss CT results and assess for any ongoing adverse events related to the study drug. Patient has also been scheduled to obtain a COVID-19 test on Friday - 11/06/19 between 10:00AM and 3:00PM at the [facility] patient towers entrance. Mr. Donaghue was contacted by phone and informed of all of this and voices agreement with this plan. He also states he will call us back and inform us of his neighbor's COVID-19 results as soon as he receives them. Patient will also be rescheduled for week of 11/30/19 to receive his first Alimta infusion off study. Yolande Jolly, BSN, MHA, OCN 11/03/2019 12:16 PM

## 2019-11-03 NOTE — Telephone Encounter (Signed)
Received call from Thea Silversmith reporting that he has potentially been exposed to COVID-19 and is requesting guidance. Patient states he was informed that his neighbor's son tested positive for COVID-19 yesterday after learning he had been exposed. States his neighbor was with his son last week on Thursday and Friday, and the neighbor is being tested today. The patient was exposed to his neighbor 2 days ago. He is currently self-isolating/not working until he hears back from the neighbor's COVID test results. He is supposed to come in for an Alimta infusion this week and questioned whether he should go ahead and get tested before hand or wait for his neighbor's results? Question posed to Dr. Rogue Bussing and Renita Papa, RN. Yolande Jolly, BSN, MHA, OCN 11/03/2019  09:41 AM

## 2019-11-04 ENCOUNTER — Telehealth: Payer: Self-pay | Admitting: Internal Medicine

## 2019-11-04 NOTE — Telephone Encounter (Signed)
Spoke to patient regarding his concerns for Covid exposure; Covid testing planned 12/04.  Is reasonable to hold chemotherapy while pending testing.  We will proceed with virtual visit as planned on 12/04.

## 2019-11-06 ENCOUNTER — Other Ambulatory Visit: Payer: BC Managed Care – PPO

## 2019-11-06 ENCOUNTER — Encounter: Payer: Self-pay | Admitting: *Deleted

## 2019-11-06 ENCOUNTER — Ambulatory Visit: Payer: BC Managed Care – PPO

## 2019-11-06 ENCOUNTER — Inpatient Hospital Stay: Payer: BC Managed Care – PPO | Attending: Internal Medicine | Admitting: Internal Medicine

## 2019-11-06 ENCOUNTER — Other Ambulatory Visit: Payer: Self-pay

## 2019-11-06 DIAGNOSIS — Z20822 Contact with and (suspected) exposure to covid-19: Secondary | ICD-10-CM

## 2019-11-06 DIAGNOSIS — C3402 Malignant neoplasm of left main bronchus: Secondary | ICD-10-CM

## 2019-11-06 DIAGNOSIS — I712 Thoracic aortic aneurysm, without rupture, unspecified: Secondary | ICD-10-CM

## 2019-11-06 NOTE — Progress Notes (Signed)
I connected with Mitchell Mosley on 11/06/19 at  1:15 PM EST by video enabled telemedicine visit and verified that I am speaking with the correct person using two identifiers.  I discussed the limitations, risks, security and privacy concerns of performing an evaluation and management service by telemedicine and the availability of in-person appointments. I also discussed with the patient that there may be a patient responsible charge related to this service. The patient expressed understanding and agreed to proceed.    Other persons participating in the visit and their role in the encounter: RN/medical reconciliation Patient's location: Work Provider's location: Office  Oncology History Overview Note  # 2012- METASTATIC ADENO CA of LEFT LUNG [acinar pattern] s/p MED LN Bx [NEG- EGFR/K-ras/ALK mutation];[name]; [name]; CT JULY  2016- NED; JAN 2018- NED;   # AUG 2020-clinical trial closed; continue maintenance Alimta  # Thoracic Aneurysm [4cm- stable]  ----------------------------------------------------------    DIAGNOSIS: Mitchell Mosley ] Adenocarcinoma lung  STAGE: 4       ;GOALS: Palliative  CURRENT/MOST RECENT THERAPY _0  Alimta maintenance    Cancer of hilus of left lung ([place])  07/20/2016 Initial Diagnosis   Cancer of hilus of left lung ([place])   11/06/2019 -  Chemotherapy   The patient had PEMEtrexed (ALIMTA) 1,100 mg in sodium chloride 0.9 % 100 mL chemo infusion, 500 mg/m2, Intravenous,  Once, 0 of 6 cycles  for chemotherapy treatment.     Chief Complaint: Lung cancer   History of present illness:Mitchell Mosley 58 y.o.  male with history of stage IV lung cancer currently NED on maintenance Alimta is here for follow-up/review results of the CAT scan.  Patient was recently exposed to a neighbor; who son was tested positive for Covid.  Patient had Covid testing today-awaiting  results.  Patient denies any cough or shortness of breath or fevers or chills.  Patient's appetite is good.  Denies any nausea vomiting abdominal pain.  Chronic mild joint pains.   Observation/objective:  Assessment and plan: Cancer of hilus of left lung ([place]) # Adenocarcinoma the lung; stage IV; NOV 30th 2020-CT scan chest/a/p-shows no evidence of metastatic disease; STABLE.  #We will hold Alimta cycle-given CT scan shows no evidence of disease; also given the possible Covid exposure  #Possible Covid exposure-awaiting testing results at this time.  #Thoracic aortic aneurysm-4.0 cm in 2016; currently 4.6 cm.  Will make referral to Dr. Faith Rogue.  #Creatinine 1.5/GFR 50; baseline GFR 60.  Monitor next visit.  # Reflux-likely secondary to dietary-improved status post PPI/Tums.  # DISPOSITION:  # referral Dr.Oaks re: Thoracic aneurysm # Follow up dec 23rd; MD-labs- cbc/cmp; alimta- Dr.B  Follow-up instructions:  I discussed the assessment and treatment plan with the patient.  The patient was provided an opportunity to ask questions and all were answered.  The patient agreed with the plan and demonstrated understanding of instructions.  The patient was advised to call back or seek an in person evaluation if the symptoms worsen or if the condition fails to improve as anticipated.  Dr. Charlaine Dalton [place] [facility] 11/06/2019 1:37 PM

## 2019-11-06 NOTE — Research (Signed)
Patient Mitchell Mosley was contacted by phone this afternoon to perform a final AE assessment for the [employer] study. He spoke to Dr. Rogue Bussing via Video Visit as well and Dr. Rogue Bussing reviewed the results of his recent CT scan - which continues to show no evidence of recurrent disease. This RN contacted the patient following his Video visit with Dr. Rogue Bussing to follow up. Patient reports he continues to experience generalized aches and pains due to not taking Mobic or Tylenol,  and he has been seeing a chiropractor off and on for this. He also continues to report increased muscle cramping in ble at the bend of his hips, left side more than right. Patient still reporting some mild edema in his BLE, mostly in his ankles and verifies he is still taking both Lasix and Spironolactone daily to control the swelling. He reports no changes in this or the odd itching in his palms, the bottoms of his feet sporadically and in his right upper inner arm, and reports using generic skin lotion as needed to manage this. Mitchell Mosley reports experiencing ongoing fatigue that is mild and lasts just a few days after he receives Alimta infusion. 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 States he went this morning as instructed by Dr. Rogue Bussing and had another COVID-19 test performed, even though his talked with his neighbor and the neighbor he was exposed to had a negative COVID-19 test result. Mitchell Mosley is aware and was previously informed by this research nurse and Dr. Rogue Bussing that the [facility] S130 study is going to close this month, and he had his last study visit/infusion performed on 10/09/2019.  Mitchell Mosley is aware that he can call the research office if he has any questions or concerns related to the study. Although his treatment was officially held today pending COVID test results, he will transfer to commercially  available Alimta from this point forward. Current and ongoing adverse events at the end of study, verified with patient today, with grade and attribution are as follows:   Adverse Event Log Study/Protocol: Mitchell Mosley S130 Cycle: 785 and Final Follow up Event Grade Onset Date Resolved Date Drug Name Pemetrexed Attribution Treatment Comments  Hyponatremia Grade 1 10/09/2019 08/28/2019 02/20/2019 10/17/18  09/21/2019 03/13/2019 11/07/18  Unlikely  Sodium down to 134 today  Cough Grade 1 09/19/18 10/17/18  Unlikely    Nasal congestion Grade 1 09/19/18 10/17/18  Unlikely    Elevated serum creatinine Grade 1 11/06/202009/03/2019 10/17/18 06/16/18 07/25/18  08/28/2019 11/07/18 07/04/18 09/05/18  Possible Increase po fluid intake Serum creatinine is 1.53 today  Edema (mild) Grade 1  12/06/2017   Possible  Trace in right and 1+ edema in left ankle  Fatigue Grade1 06/27/2019  01/14/16  03/04/2019  Possible    Tendonitis Grade 1  09/21/16   Unrelated  Meloxicam  Hypocalcemia Grade 1 05/26/18 06/16/18  Possible  Resolved, Calcium level WNL  Hyperglycemia Grade 1 05/26/18 11/09/15  09/08/16  Unlikely  Non-fasting glucose was 179mg /dL  Thrombocytopenia Grade 1 08/28/2019 05/15/2019 09/26/18 07/04/18 05/26/18 12/06/17 08/02/17 09/21/2019 07/17/2019 11/27/18 07/25/18 06/16/18 08/02/17  08/23/17  Possible  Platelet count 162,000 today  Nausea Grade 1 11/16/17 11/17/17  Unrelated  R/T antibiotic  Abrasions on Right knuckles Grade 1 02/03/18 02/28/18  Unrelated  r/t Dog walking accident  Pruritis Grade 1  04/19/17   Possible Uses otc lotion with relief deep  itching under the skin of palms, feet & RUE  Elevated cholesterol Grade 1 03/15/14   Unrelated  No ongoing monitoring available  Valvular Heart Disease Grade 1 10/19/13   Unrelated      Erectile Dysfunction  Grade 2 12/11/15   Possible Cialis   Skin hyperpigmentation Grade 1 07/08/15   Unrelated   Medial aspect of feel bil  Aneurysm of Ascending Aorta Grade 1 10/14/13   Unrelated referring to cardio-thoracic for eval mentioned on CT scans  Possible arthritis of Left Knee Grade 1 05/06/15   Unrelated    Elevated BUN Grade 1 05/15/2019 09/26/18 07/25/18 09/21/2019 10/17/18   08/18/18  Possible    GI upset Grade 1 06/27/2019 07/23/2019  Possible  Resolved with pro-biotics  Anemia Grade 1 08/07/2019 09/21/2019  Possible  Hgb 13.7 today  Myalgia Grade 1 08/28/2019   Unlikely  Muscle cramps in BLE  Heartburn/Reflux Grade 1 10/01/2019 10/02/2019  Unlikely  Resolved spontaneously  Yolande Jolly, BSN, MHA, OCN 11/06/2019 2:28 PM

## 2019-11-06 NOTE — Addendum Note (Signed)
Addended by: Jarrett Ables D on: 11/06/2019 01:47 PM   Modules accepted: Orders

## 2019-11-06 NOTE — Assessment & Plan Note (Signed)
#   Adenocarcinoma the lung; stage IV; NOV 30th 2020-CT scan chest/a/p-shows no evidence of metastatic disease; STABLE.  #We will hold Alimta cycle-given CT scan shows no evidence of disease; also given the possible Covid exposure  #Possible Covid exposure-awaiting testing results at this time.  #Thoracic aortic aneurysm-4.0 cm in 2016; currently 4.6 cm.  Will make referral to Dr. Faith Rogue.  #Creatinine 1.5/GFR 50; baseline GFR 60.  Monitor next visit.  # Reflux-likely secondary to dietary-improved status post PPI/Tums.  # DISPOSITION:  # referral Dr.Oaks re: Thoracic aneurysm # Follow up dec 23rd; MD-labs- cbc/cmp; alimta- Dr.B

## 2019-11-09 LAB — NOVEL CORONAVIRUS, NAA: SARS-CoV-2, NAA: NOT DETECTED

## 2019-11-13 ENCOUNTER — Other Ambulatory Visit: Payer: Self-pay | Admitting: *Deleted

## 2019-11-13 DIAGNOSIS — I712 Thoracic aortic aneurysm, without rupture, unspecified: Secondary | ICD-10-CM

## 2019-11-20 ENCOUNTER — Other Ambulatory Visit: Payer: Self-pay

## 2019-11-20 ENCOUNTER — Encounter: Payer: Self-pay | Admitting: Cardiothoracic Surgery

## 2019-11-20 ENCOUNTER — Ambulatory Visit: Payer: BC Managed Care – PPO | Admitting: Cardiothoracic Surgery

## 2019-11-20 VITALS — BP 138/92 | HR 83 | Temp 97.9°F | Ht 70.0 in | Wt 213.0 lb

## 2019-11-20 DIAGNOSIS — I712 Thoracic aortic aneurysm, without rupture, unspecified: Secondary | ICD-10-CM

## 2019-11-20 NOTE — Patient Instructions (Addendum)
Recommend an MRA of the area and follow up via a virtual visit in [place] in 6 months.  We will send you a letter about this.   Thoracic Aortic Aneurysm  An aneurysm is a bulge in an artery. It happens when blood pushes up against a weakened or damaged artery wall. A thoracic aortic aneurysm is an aneurysm that occurs in the first part of the aorta, between the heart and the diaphragm. The aorta is the main artery of the body. It supplies blood from the heart to the rest of the body. Some aneurysms may not cause symptoms or problems. However, a thoracic aortic aneurysm can cause two serious problems:  It can enlarge and burst (rupture).  It can cause blood to flow between the layers of the wall of the aorta through a tear (aortic dissection). Both of these problems are medical emergencies. They can cause bleeding inside the body and can be life-threatening if they are not diagnosed and treated right away. What are the causes? The exact cause of this condition is not known. What increases the risk? The following factors may make you more likely to develop this condition:  Being 26 years of age or older.  Having a family history of aneurysms.  Using tobacco.  Having any of these conditions: ? Hardening of the arteries caused by the buildup of fat and other substances in the lining of a blood vessel (arteriosclerosis). ? Inflammation of the walls of an artery (arteritis). ? A genetic disease that weakens the body's connective tissue, such as Marfan syndrome. ? An injury or trauma to the aorta. ? High blood pressure (hypertension). ? High cholesterol. ? An infection from bacteria, such as syphilis or staphylococcus, in the wall of the aorta (infectious aortitis). What are the signs or symptoms? Symptoms of this condition vary depending on the size of the aneurysm and how fast it is growing. Most grow slowly and do not cause symptoms. When symptoms do occur, they may include:  Pain in  the chest, back, sides, or abdomen. The pain may vary in intensity. Sudden, severe pain may indicate that the aneurysm has ruptured.  Hoarseness.  Cough.  Shortness of breath.  Swallowing problems.  Swelling in the face, arms, or legs.  Fever.  Unexplained weight loss. How is this diagnosed? This condition may be diagnosed with:  An ultrasound.  X-rays.  CT scan.  MRI.  A test to check the arteries for damage or blockages (angiogram). Most unruptured thoracic aortic aneurysms cause no symptoms, so they are often found during exams for other conditions. How is this treated? Treatment for this condition depends on:  The size of the aneurysm.  How fast the aneurysm is growing.  Your age.  Risk factors for rupture. Small aneurysms (2.2 inches, or 5.5 cm, or less) may be managed with:  Medicines to: ? Control blood pressure. ? Manage pain. ? Fight infection.  Regular monitoring. This may include an ultrasound or CT scan every year or every 6 months to see if the aneurysm is getting bigger. Large or fast-growing aneurysms may be treated with surgery. Follow these instructions at home: Eating and drinking   Eat a healthy diet. Your health care provider may recommend that you: ? Lower your salt (sodium) intake. In some people, too much salt can raise blood pressure and increase the risk for thoracic aortic aneurysm. ? Avoid foods that are high in saturated fat and cholesterol, such as red meat and full-fat dairy. ? Eat a  diet that is low in sugar. ? Increase your fiber intake by including whole grains, vegetables, and fruits in your diet. Eating these foods may help to lower your blood pressure.  Do not drink alcohol if your health care provider tells you not to drink.  If you drink alcohol: ? Limit how much you use to:  0-1 drink a day for women.  0-2 drinks a day for men. ? Be aware of how much alcohol is in your drink. In the [place] one drink equals one 12  oz bottle of beer (355 mL), one 5 oz glass of wine (148 mL), or one 1 oz glass of hard liquor (44 mL). Lifestyle  Do not use any products that contain nicotine or tobacco, such as cigarettes, e-cigarettes, and chewing tobacco. If you need help quitting, ask your health care provider.  Maintain a healthy weight.  Check your blood pressure regularly. Follow your health care provider's instructions on how to keep your blood pressure within normal limits.  Have your blood sugar (glucose) level and cholesterol levels checked regularly. Follow your health care provider's instructions on how to keep levels within normal limits. Activity   Stay physically active and exercise regularly. Talk with your health care provider about how often you should exercise and ask which types of exercise are best for you.  Avoid heavy lifting and activities that take a lot of effort. Ask your health care provider what activities are safe for you. General instructions  Take over-the-counter and prescription medicines only as told by your health care provider.  Talk with your health care provider about regular screenings to see if the aneurysm is getting bigger.  Keep all follow-up visits as told by your health care provider. This is important. Contact a health care provider if you have:  Unexplained weight loss. Get help right away if you have:  Pain in your upper back, neck, or abdomen. This pain may move into your chest and arms.  Trouble swallowing.  A cough or hoarseness.  Shortness of breath. Summary  A thoracic aortic aneurysm is an aneurysm that occurs in the first part of the aorta, between the heart and the diaphragm.  As a thoracic aortic aneurysm becomes larger, it can burst (rupture), or blood can flow between the layers of the wall of the aorta through a tear (aorticdissection). These conditions can be life-threatening if they are not diagnosed and treated right away.  If you have a  thoracic aortic aneurysm, its growth will be closely monitored. Surgical repair may be needed for larger or faster-growing aneurysms. This information is not intended to replace advice given to you by your health care provider. Make sure you discuss any questions you have with your health care provider. Document Released: 11/19/2005 Document Revised: 07/08/2018 Document Reviewed: 07/09/2018 Elsevier Patient Education  2020 [employer].

## 2019-11-20 NOTE — Progress Notes (Signed)
Patient ID: Mitchell Mosley, male   DOB: [birth year], 58 y.o.   MRN: [id number]  Chief Complaint  Patient presents with  . New Patient (Initial Visit)    Thoracic Aortic Aneurysm     Referred By Dr. Iris Pert Monday Reason for Referral thoracic aortic aneurysm  HPI Location, Quality, Duration, Severity, Timing, Context, Modifying Factors, Associated Signs and Symptoms.  Mitchell Mosley is a 58 y.o. male.  I have a previously seen this gentleman for an adenocarcinoma of the lung and had been following up with him since then.  He has had multiple x-rays made over the years.  The patient has undergone chemotherapy and is currently receiving chemotherapy.  He had a chest CT scan which revealed a 4.6 cm ascending aortic aneurysm.  Over the years this has been variously measured between 4.3 and 4.5 cm.  He is completely asymptomatic.  He does see cardiology and has no known history of aortic valve disease.  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   Past Medical History:  Diagnosis Date  . Allergy   . Lung cancer ([place])   . Mild valvular heart disease Nov. 2014   per 2 D Echocardiogram done for persistent leg edema, monitored by cardiology  . Peripheral edema     Past Surgical History:  Procedure Laterality Date  . ELBOW SURGERY Right 2011  . Left anterior thoracotomy with biopsy  March 2012  . PORTACATH PLACEMENT  March 2012  . TONSILLECTOMY  as child    Family History  Problem Relation Age of Onset  . Leukemia Father   . Diabetes Mother     Social History Social History   Tobacco Use  . Smoking status: Never Smoker  . Smokeless tobacco: Never Used  Substance Use Topics  . Alcohol use: Yes    Alcohol/week: 0.0 standard drinks    Comment: "social drinker"   . Drug use: No    Allergies  Allergen Reactions  . Penicillins     Reaction and severity unknown    Current Outpatient Medications  Medication Sig Dispense Refill  . folic acid (FOLVITE)  1 MG tablet Take 1 tablet (1 mg total) by mouth daily. 90 tablet 3  . furosemide (LASIX) 20 MG tablet TAKE 1 TABLET (20 MG TOTAL) BY MOUTH DAILY AS NEEDED FOR SWELLING 90 tablet 1  . loratadine (CLARITIN) 10 MG tablet Take 1 tablet (10 mg total) by mouth daily.    . meloxicam (MOBIC) 7.5 MG tablet Take 7.5 mg by mouth daily.     . Multiple Vitamin (MULTIVITAMIN) capsule Take 1 capsule by mouth daily. Reported on 02/24/2016    . Omega-3 Fatty Acids (FISH OIL) 1000 MG CAPS Take 1 capsule by mouth daily. Reported on 02/24/2016    . senna (SENOKOT) 8.6 MG tablet Take 1 tablet by mouth daily. Take as needed around time of chemotherapy    . spironolactone (ALDACTONE) 25 MG tablet TAKE 1 TABLET BY MOUTH EVERY DAY 90 tablet 3  . tadalafil (CIALIS) 20 MG tablet TAKE 1 TABLET BY MOUTH EVERY 2 DAYS AS NEEDED. 6 tablet 1  . vitamin B-12 (CYANOCOBALAMIN) 1000 MCG tablet Take 1,000 mcg by mouth daily.     No current facility-administered medications for this visit.   Facility-Administered Medications Ordered in Other Visits  Medication Dose Route Frequency Provider Last Rate Last Admin  . sodium chloride 0.9 % injection 10 mL  10 mL Intracatheter PRN [name], MD  10 mL at 05/27/15 1543  . sodium chloride 0.9 % injection 10 mL  10 mL Intracatheter PRN [name], MD   10 mL at 06/17/15 1510  . sodium chloride 0.9 % injection 10 mL  10 mL Intravenous PRN Choksi, Delorise Shiner, MD   10 mL at 07/08/15 1340      Review of Systems A complete review of systems was asked and was negative except for the following positive findings joint pain.  Blood pressure (!) 138/92, pulse 83, temperature 97.9 F (36.6 C), height 5\' 10"  (1.778 m), weight 213 lb (96.6 kg), SpO2 99 %.  Physical Exam CONSTITUTIONAL:  Pleasant, well-developed, well-nourished, and in no acute distress. EYES: Pupils equal and reactive to light, Sclera non-icteric EARS, NOSE, MOUTH AND THROAT:  The oropharynx was clear.  Dentition is good  repair.  Oral mucosa pink and moist. LYMPH NODES:  Lymph nodes in the neck and axillae were normal RESPIRATORY:  Lungs were clear.  Normal respiratory effort without pathologic use of accessory muscles of respiration CARDIOVASCULAR: Heart was regular without murmurs.  There were no carotid bruits. GI: The abdomen was soft, nontender, and nondistended. There were no palpable masses. There was no hepatosplenomegaly. There were normal bowel sounds in all quadrants. GU:  Rectal deferred.   MUSCULOSKELETAL:  Normal muscle strength and tone.  No clubbing or cyanosis.   SKIN:  There were no pathologic skin lesions.  There were no nodules on palpation. NEUROLOGIC:  Sensation is normal.  Cranial nerves are grossly intact. PSYCH:  Oriented to person, place and time.  Mood and affect are normal.  Data Reviewed Multiple CT scans  I have personally reviewed the patient's imaging, laboratory findings and medical records.    Assessment    Enlargement of thoracic aortic aneurysm (ascending aortic aneurysm measuring 4.6 cm)    Plan    I had a long discussion with him regarding the indications of continued observation.  I reviewed with him the options of biannual CT scan versus MRI.  He has had an MRI performed in the past and has no problems with that.  Therefore would like to get another MRI of the chest in 6 months time.  I will see him at that point.  Hopefully her virtual visits will be able to be completed by then.    Nestor Lewandowsky, MD 11/20/2019, 10:01 AM

## 2019-11-24 ENCOUNTER — Telehealth: Payer: Self-pay | Admitting: *Deleted

## 2019-11-24 ENCOUNTER — Other Ambulatory Visit: Payer: Self-pay

## 2019-11-24 NOTE — Telephone Encounter (Signed)
I will call pt  GB

## 2019-11-24 NOTE — Telephone Encounter (Signed)
Patient called reporting that he has had third hand contact with Corona Virus and that he has an appointment tomorrow and needs to know what to do. Please advise

## 2019-11-24 NOTE — Telephone Encounter (Signed)
Dr. B - please advise. 

## 2019-11-25 ENCOUNTER — Inpatient Hospital Stay: Payer: BC Managed Care – PPO | Admitting: Internal Medicine

## 2019-11-25 ENCOUNTER — Inpatient Hospital Stay: Payer: BC Managed Care – PPO

## 2019-11-25 ENCOUNTER — Telehealth: Payer: Self-pay | Admitting: Internal Medicine

## 2019-11-25 NOTE — Telephone Encounter (Signed)
Msg sent to scheduling team to r/s pt's apts

## 2019-11-25 NOTE — Telephone Encounter (Signed)
12/22-spoke to patient regarding his concerns for possible Covid exposure.  Awaiting further details.  Patient asymptomatic.  Reasonable to hold off testing at this time.  Holding treatment this time.  C-reschedule/MD; labs; Alimta chemotherapy to first week of January 2021.  Cancel today's appointments.  Thx

## 2019-12-10 ENCOUNTER — Other Ambulatory Visit: Payer: Self-pay

## 2019-12-10 ENCOUNTER — Encounter: Payer: Self-pay | Admitting: Internal Medicine

## 2019-12-11 ENCOUNTER — Inpatient Hospital Stay (HOSPITAL_BASED_OUTPATIENT_CLINIC_OR_DEPARTMENT_OTHER): Payer: BC Managed Care – PPO | Admitting: Internal Medicine

## 2019-12-11 ENCOUNTER — Other Ambulatory Visit: Payer: Self-pay

## 2019-12-11 ENCOUNTER — Inpatient Hospital Stay: Payer: BC Managed Care – PPO | Attending: Internal Medicine

## 2019-12-11 ENCOUNTER — Inpatient Hospital Stay: Payer: BC Managed Care – PPO

## 2019-12-11 DIAGNOSIS — M2550 Pain in unspecified joint: Secondary | ICD-10-CM | POA: Diagnosis not present

## 2019-12-11 DIAGNOSIS — Z95828 Presence of other vascular implants and grafts: Secondary | ICD-10-CM

## 2019-12-11 DIAGNOSIS — D696 Thrombocytopenia, unspecified: Secondary | ICD-10-CM

## 2019-12-11 DIAGNOSIS — Z79899 Other long term (current) drug therapy: Secondary | ICD-10-CM | POA: Insufficient documentation

## 2019-12-11 DIAGNOSIS — Z791 Long term (current) use of non-steroidal anti-inflammatories (NSAID): Secondary | ICD-10-CM | POA: Diagnosis not present

## 2019-12-11 DIAGNOSIS — Z5111 Encounter for antineoplastic chemotherapy: Secondary | ICD-10-CM

## 2019-12-11 DIAGNOSIS — C3402 Malignant neoplasm of left main bronchus: Secondary | ICD-10-CM | POA: Diagnosis not present

## 2019-12-11 DIAGNOSIS — M7989 Other specified soft tissue disorders: Secondary | ICD-10-CM | POA: Diagnosis not present

## 2019-12-11 DIAGNOSIS — I712 Thoracic aortic aneurysm, without rupture: Secondary | ICD-10-CM | POA: Insufficient documentation

## 2019-12-11 LAB — COMPREHENSIVE METABOLIC PANEL
ALT: 17 U/L (ref 0–44)
AST: 20 U/L (ref 15–41)
Albumin: 4.1 g/dL (ref 3.5–5.0)
Alkaline Phosphatase: 40 U/L (ref 38–126)
Anion gap: 10 (ref 5–15)
BUN: 22 mg/dL — ABNORMAL HIGH (ref 6–20)
CO2: 23 mmol/L (ref 22–32)
Calcium: 8.8 mg/dL — ABNORMAL LOW (ref 8.9–10.3)
Chloride: 101 mmol/L (ref 98–111)
Creatinine, Ser: 1.08 mg/dL (ref 0.61–1.24)
GFR calc Af Amer: >60 mL/min (ref >60)
GFR calc non Af Amer: >60 mL/min (ref >60)
Glucose, Bld: 186 mg/dL — ABNORMAL HIGH (ref 70–99)
Potassium: 3.7 mmol/L (ref 3.5–5.1)
Sodium: 134 mmol/L — ABNORMAL LOW (ref 135–145)
Total Bilirubin: 0.9 mg/dL (ref 0.3–1.2)
Total Protein: 7.2 g/dL (ref 6.5–8.1)

## 2019-12-11 LAB — CBC WITH DIFFERENTIAL/PLATELET
Abs Immature Granulocytes: 0.03 10*3/uL (ref 0.00–0.07)
Basophils Absolute: 0 10*3/uL (ref 0.0–0.1)
Basophils Relative: 1 %
Eosinophils Absolute: 0.2 10*3/uL (ref 0.0–0.5)
Eosinophils Relative: 4 %
HCT: 44.1 % (ref 39.0–52.0)
Hemoglobin: 14.7 g/dL (ref 13.0–17.0)
Immature Granulocytes: 1 %
Lymphocytes Relative: 33 %
Lymphs Abs: 2.1 10*3/uL (ref 0.7–4.0)
MCH: 31.3 pg (ref 26.0–34.0)
MCHC: 33.3 g/dL (ref 30.0–36.0)
MCV: 94 fL (ref 80.0–100.0)
Monocytes Absolute: 0.6 10*3/uL (ref 0.1–1.0)
Monocytes Relative: 9 %
Neutro Abs: 3.3 10*3/uL (ref 1.7–7.7)
Neutrophils Relative %: 52 %
Platelets: 141 10*3/uL — ABNORMAL LOW (ref 150–400)
RBC: 4.69 MIL/uL (ref 4.22–5.81)
RDW: 11.7 % (ref 11.5–15.5)
WBC: 6.2 10*3/uL (ref 4.0–10.5)
nRBC: 0 % (ref 0.0–0.2)

## 2019-12-11 MED ORDER — SODIUM CHLORIDE 0.9% FLUSH
10.0000 mL | Freq: Once | INTRAVENOUS | Status: AC
  Administered 2019-12-11: 10 mL via INTRAVENOUS
  Filled 2019-12-11: qty 10

## 2019-12-11 MED ORDER — SODIUM CHLORIDE 0.9 % IV SOLN
500.0000 mg/m2 | Freq: Once | INTRAVENOUS | Status: AC
  Administered 2019-12-11: 1100 mg via INTRAVENOUS
  Filled 2019-12-11: qty 40

## 2019-12-11 MED ORDER — PROCHLORPERAZINE MALEATE 10 MG PO TABS
10.0000 mg | ORAL_TABLET | Freq: Once | ORAL | Status: AC
  Administered 2019-12-11: 10 mg via ORAL
  Filled 2019-12-11: qty 1

## 2019-12-11 MED ORDER — SODIUM CHLORIDE 0.9 % IV SOLN
Freq: Once | INTRAVENOUS | Status: AC
  Filled 2019-12-11: qty 250

## 2019-12-11 MED ORDER — HEPARIN SOD (PORK) LOCK FLUSH 100 UNIT/ML IV SOLN
500.0000 [IU] | Freq: Once | INTRAVENOUS | Status: AC | PRN
  Administered 2019-12-11: 500 [IU]
  Filled 2019-12-11: qty 5

## 2019-12-11 MED ORDER — CYANOCOBALAMIN 1000 MCG/ML IJ SOLN
1000.0000 ug | Freq: Once | INTRAMUSCULAR | Status: AC
  Administered 2019-12-11: 1000 ug via INTRAMUSCULAR
  Filled 2019-12-11: qty 1

## 2019-12-11 NOTE — Progress Notes (Signed)
[place] OFFICE PROGRESS NOTE  Patient Care Team: [name], MD as PCP - General [facility])  Cancer Staging No matching staging information was found for the patient.   Oncology History Overview Note  # 2012- METASTATIC ADENO CA of LEFT LUNG [acinar pattern] s/p MED LN Bx [NEG- EGFR/K-ras/ALK mutation];[name]; [name]; CT JULY  2016- NED; JAN 2018- NED;   # AUG 2020-clinical trial closed; continue maintenance Alimta  # Thoracic Aneurysm [4cm- stable]  ----------------------------------------------------------    DIAGNOSIS: Mitchell Mosley ] Adenocarcinoma lung  STAGE: 4       ;GOALS: Palliative  CURRENT/MOST RECENT THERAPY [ ] Alimta maintenance    Cancer of hilus of left lung ([place])  07/20/2016 Initial Diagnosis   Cancer of hilus of left lung ([place])   12/11/2019 -  Chemotherapy   The patient had PEMEtrexed (ALIMTA) 1,100 mg in sodium chloride 0.9 % 100 mL chemo infusion, 500 mg/m2 = 1,100 mg, Intravenous,  Once, 1 of 6 cycles Administration: 1,100 mg (12/11/2019)  for chemotherapy treatment.       INTERVAL HISTORY:  Mitchell Mosley 59 y.o.  male pleasant patient above history of metastatic adenocarcinoma the lung-NED currently on maintenance Alimta is here for follow-up.  Patient had multiple interruptions to maintenance chemotherapy because of possible Covid exposure.  Patient has been tested multiple times-negative.  Continues to feel good.  No fever no chills.  Appetite is good with no weight loss.  Chronic mild arthritis improved on Tylenol.   Review of Systems  Constitutional: Negative for chills, diaphoresis, fever, malaise/fatigue and weight loss.  HENT: Negative for nosebleeds and sore throat.   Eyes: Negative for double vision.  Respiratory: Negative for hemoptysis, shortness of breath and wheezing.   Cardiovascular: Positive for leg  swelling. Negative for chest pain, palpitations and orthopnea.  Gastrointestinal: Negative for abdominal pain, blood in stool, constipation, diarrhea, heartburn, melena, nausea and vomiting.  Genitourinary: Negative for dysuria, frequency and urgency.  Musculoskeletal: Positive for joint pain.  Skin: Negative.  Negative for itching and rash.  Neurological: Negative for dizziness, tingling, focal weakness, weakness and headaches.  Endo/Heme/Allergies: Does not bruise/bleed easily.  Psychiatric/Behavioral: Negative for depression. The patient is not nervous/anxious and does not have insomnia.       PAST MEDICAL HISTORY :  Past Medical History:  Diagnosis Date  . Allergy   . Lung cancer ([place])   . Mild valvular heart disease Nov. 2014   per 2 D Echocardiogram done for persistent leg edema, monitored by cardiology  . Peripheral edema     PAST SURGICAL HISTORY :   Past Surgical History:  Procedure Laterality Date  . ELBOW SURGERY Right 2011  . Left anterior thoracotomy with biopsy  March 2012  . PORTACATH PLACEMENT  March 2012  . TONSILLECTOMY  as child    FAMILY HISTORY :   Family History  Problem Relation Age of Onset  . Leukemia Father   . Diabetes Mother     SOCIAL HISTORY:   Social History   Tobacco Use  . Smoking status: Never Smoker  . Smokeless tobacco: Never Used  Substance Use Topics  . Alcohol use: Yes    Alcohol/week: 0.0 standard drinks    Comment: "social drinker"   . Drug use: No    ALLERGIES:  is allergic to penicillins.  MEDICATIONS:  Current Outpatient Medications  Medication Sig Dispense Refill  . folic acid (FOLVITE)  1 MG tablet Take 1 tablet (1 mg total) by mouth daily. 90 tablet 3  . furosemide (LASIX) 20 MG tablet TAKE 1 TABLET (20 MG TOTAL) BY MOUTH DAILY AS NEEDED FOR SWELLING 90 tablet 1  . loratadine (CLARITIN) 10 MG tablet Take 1 tablet (10 mg total) by mouth daily.    . meloxicam (MOBIC) 7.5 MG tablet Take 7.5 mg by mouth daily.     .  Multiple Vitamin (MULTIVITAMIN) capsule Take 1 capsule by mouth daily. Reported on 02/24/2016    . Omega-3 Fatty Acids (FISH OIL) 1000 MG CAPS Take 1 capsule by mouth daily. Reported on 02/24/2016    . senna (SENOKOT) 8.6 MG tablet Take 1 tablet by mouth daily. Take as needed around time of chemotherapy    . spironolactone (ALDACTONE) 25 MG tablet TAKE 1 TABLET BY MOUTH EVERY DAY 90 tablet 3  . tadalafil (CIALIS) 20 MG tablet TAKE 1 TABLET BY MOUTH EVERY 2 DAYS AS NEEDED. 6 tablet 1  . vitamin B-12 (CYANOCOBALAMIN) 1000 MCG tablet Take 1,000 mcg by mouth daily.     No current facility-administered medications for this visit.   Facility-Administered Medications Ordered in Other Visits  Medication Dose Route Frequency Provider Last Rate Last Admin  . sodium chloride 0.9 % injection 10 mL  10 mL Intracatheter PRN [name], MD   10 mL at 05/27/15 1543  . sodium chloride 0.9 % injection 10 mL  10 mL Intracatheter PRN [name], MD   10 mL at 06/17/15 1510  . sodium chloride 0.9 % injection 10 mL  10 mL Intravenous PRN [name], MD   10 mL at 07/08/15 1340    PHYSICAL EXAMINATION: ECOG PERFORMANCE STATUS: 0 - Asymptomatic  BP 133/81 (Patient Position: Sitting)   Pulse 96   Temp (!) 97.2 F (36.2 C) (Tympanic)   Resp 20   Ht 5' 10" (1.778 m)   Wt 216 lb (98 kg)   BMI 30.99 kg/m   Filed Weights   12/11/19 1328  Weight: 216 lb (98 kg)    Physical Exam  Constitutional: He is oriented to person, place, and time and well-developed, well-nourished, and in no distress.  HENT:  Head: Normocephalic and atraumatic.  Mouth/Throat: Oropharynx is clear and moist. No oropharyngeal exudate.  Eyes: Pupils are equal, round, and reactive to light.  Cardiovascular: Normal rate and regular rhythm.  Pulmonary/Chest: No respiratory distress. He has no wheezes.  Abdominal: Soft. Bowel sounds are normal. He exhibits no distension and no mass. There is no abdominal tenderness. There is no  rebound and no guarding.  Musculoskeletal:        General: Edema present. No tenderness. Normal range of motion.     Cervical back: Normal range of motion and neck supple.     Comments: Grade 1 swelling in the legs bilaterally.  Neurological: He is alert and oriented to person, place, and time.  Skin: Skin is warm.  Psychiatric: Affect normal.     LABORATORY DATA:  I have reviewed the data as listed    Component Value Date/Time   NA 134 (L) 12/11/2019 1310   NA 138 12/10/2014 1341   K 3.7 12/11/2019 1310   K 3.6 12/10/2014 1341   CL 101 12/11/2019 1310   CL 102 12/10/2014 1341   CO2 23 12/11/2019 1310   CO2 30 12/10/2014 1341   GLUCOSE 186 (H) 12/11/2019 1310   GLUCOSE 160 (H) 12/10/2014 1341   BUN 22 (H) 12/11/2019 1310  BUN 21 (H) 12/10/2014 1341   CREATININE 1.08 12/11/2019 1310   CREATININE 1.10 03/25/2015 1453   CALCIUM 8.8 (L) 12/11/2019 1310   CALCIUM 8.4 (L) 12/10/2014 1341   PROT 7.2 12/11/2019 1310   PROT 6.6 12/10/2014 1341   ALBUMIN 4.1 12/11/2019 1310   ALBUMIN 3.4 12/10/2014 1341   AST 20 12/11/2019 1310   AST 17 12/10/2014 1341   ALT 17 12/11/2019 1310   ALT 23 12/10/2014 1341   ALKPHOS 40 12/11/2019 1310   ALKPHOS 50 12/10/2014 1341   BILITOT 0.9 12/11/2019 1310   BILITOT 0.5 12/10/2014 1341   GFRNONAA >60 12/11/2019 1310   GFRNONAA >60 03/25/2015 1453   GFRAA >60 12/11/2019 1310   GFRAA >60 03/25/2015 1453    No results found for: SPEP, UPEP  Lab Results  Component Value Date   WBC 6.2 12/11/2019   NEUTROABS 3.3 12/11/2019   HGB 14.7 12/11/2019   HCT 44.1 12/11/2019   MCV 94.0 12/11/2019   PLT 141 (L) 12/11/2019      Chemistry      Component Value Date/Time   NA 134 (L) 12/11/2019 1310   NA 138 12/10/2014 1341   K 3.7 12/11/2019 1310   K 3.6 12/10/2014 1341   CL 101 12/11/2019 1310   CL 102 12/10/2014 1341   CO2 23 12/11/2019 1310   CO2 30 12/10/2014 1341   BUN 22 (H) 12/11/2019 1310   BUN 21 (H) 12/10/2014 1341   CREATININE  1.08 12/11/2019 1310   CREATININE 1.10 03/25/2015 1453      Component Value Date/Time   CALCIUM 8.8 (L) 12/11/2019 1310   CALCIUM 8.4 (L) 12/10/2014 1341   ALKPHOS 40 12/11/2019 1310   ALKPHOS 50 12/10/2014 1341   AST 20 12/11/2019 1310   AST 17 12/10/2014 1341   ALT 17 12/11/2019 1310   ALT 23 12/10/2014 1341   BILITOT 0.9 12/11/2019 1310   BILITOT 0.5 12/10/2014 1341       RADIOGRAPHIC STUDIES: I have personally reviewed the radiological images as listed and agreed with the findings in the report. No results found.   ASSESSMENT & PLAN:  Cancer of hilus of left lung ([place]) # Adenocarcinoma the lung; stage IV; NOV 30th 2020-CT scan chest/a/p-shows no evidence of metastatic disease; STABLE.  #Proceed with Alimta chemotherapy. Labs today reviewed;  acceptable for treatment today.   #Thoracic aortic aneurysm-4.0 cm in 2016; currently 4.6 cm.  s/p referral to Dr. Faith Rogue.stable.  MRI in 6 months planned.   # DISPOSITION:  # Alimta today # Follow up in 3 weeks MD-labs- cbc/cmp; alimta- Dr.B   No orders of the defined types were placed in this encounter.  All questions were answered. The patient knows to call the clinic with any problems, questions or concerns.      [name], MD 12/14/2019 8:13 AM

## 2019-12-11 NOTE — Assessment & Plan Note (Addendum)
#   Adenocarcinoma the lung; stage IV; NOV 30th 2020-CT scan chest/a/p-shows no evidence of metastatic disease; STABLE.  #Proceed with Alimta chemotherapy. Labs today reviewed;  acceptable for treatment today.   #Thoracic aortic aneurysm-4.0 cm in 2016; currently 4.6 cm.  s/p referral to Dr. Faith Rogue.stable.  MRI in 6 months planned.   # DISPOSITION:  # Alimta today # Follow up in 3 weeks MD-labs- cbc/cmp; alimta- Dr.B

## 2019-12-11 NOTE — Progress Notes (Signed)
Orders clarified with Dr. Rogue Bussing. Per Dr. Rogue Bussing pt to receive Compazine PO 10 mg, Alimta and Vitamin B12 only.

## 2019-12-22 ENCOUNTER — Ambulatory Visit: Payer: BC Managed Care – PPO

## 2019-12-22 ENCOUNTER — Other Ambulatory Visit: Payer: BC Managed Care – PPO

## 2019-12-22 ENCOUNTER — Ambulatory Visit: Payer: BC Managed Care – PPO | Admitting: Internal Medicine

## 2020-01-01 ENCOUNTER — Encounter: Payer: Self-pay | Admitting: Internal Medicine

## 2020-01-01 ENCOUNTER — Inpatient Hospital Stay (HOSPITAL_BASED_OUTPATIENT_CLINIC_OR_DEPARTMENT_OTHER): Payer: BC Managed Care – PPO | Admitting: Internal Medicine

## 2020-01-01 ENCOUNTER — Inpatient Hospital Stay: Payer: BC Managed Care – PPO

## 2020-01-01 ENCOUNTER — Other Ambulatory Visit: Payer: Self-pay

## 2020-01-01 VITALS — BP 122/84 | HR 82 | Temp 97.2°F | Resp 20 | Ht 70.0 in | Wt 220.0 lb

## 2020-01-01 DIAGNOSIS — M7989 Other specified soft tissue disorders: Secondary | ICD-10-CM | POA: Diagnosis not present

## 2020-01-01 DIAGNOSIS — Z79899 Other long term (current) drug therapy: Secondary | ICD-10-CM | POA: Diagnosis not present

## 2020-01-01 DIAGNOSIS — C3402 Malignant neoplasm of left main bronchus: Secondary | ICD-10-CM

## 2020-01-01 DIAGNOSIS — Z5111 Encounter for antineoplastic chemotherapy: Secondary | ICD-10-CM | POA: Diagnosis not present

## 2020-01-01 DIAGNOSIS — Z791 Long term (current) use of non-steroidal anti-inflammatories (NSAID): Secondary | ICD-10-CM | POA: Diagnosis not present

## 2020-01-01 DIAGNOSIS — Z95828 Presence of other vascular implants and grafts: Secondary | ICD-10-CM

## 2020-01-01 DIAGNOSIS — I712 Thoracic aortic aneurysm, without rupture: Secondary | ICD-10-CM | POA: Diagnosis not present

## 2020-01-01 DIAGNOSIS — M2550 Pain in unspecified joint: Secondary | ICD-10-CM | POA: Diagnosis not present

## 2020-01-01 LAB — COMPREHENSIVE METABOLIC PANEL
ALT: 21 U/L (ref 0–44)
AST: 23 U/L (ref 15–41)
Albumin: 4 g/dL (ref 3.5–5.0)
Alkaline Phosphatase: 48 U/L (ref 38–126)
Anion gap: 9 (ref 5–15)
BUN: 18 mg/dL (ref 6–20)
CO2: 28 mmol/L (ref 22–32)
Calcium: 9.5 mg/dL (ref 8.9–10.3)
Chloride: 98 mmol/L (ref 98–111)
Creatinine, Ser: 1.22 mg/dL (ref 0.61–1.24)
GFR calc Af Amer: >60 mL/min (ref >60)
GFR calc non Af Amer: >60 mL/min (ref >60)
Glucose, Bld: 148 mg/dL — ABNORMAL HIGH (ref 70–99)
Potassium: 4.1 mmol/L (ref 3.5–5.1)
Sodium: 135 mmol/L (ref 135–145)
Total Bilirubin: 0.7 mg/dL (ref 0.3–1.2)
Total Protein: 7.3 g/dL (ref 6.5–8.1)

## 2020-01-01 LAB — CBC WITH DIFFERENTIAL/PLATELET
Abs Immature Granulocytes: 0.03 10*3/uL (ref 0.00–0.07)
Basophils Absolute: 0.1 10*3/uL (ref 0.0–0.1)
Basophils Relative: 1 %
Eosinophils Absolute: 0.2 10*3/uL (ref 0.0–0.5)
Eosinophils Relative: 4 %
HCT: 44.1 % (ref 39.0–52.0)
Hemoglobin: 14.5 g/dL (ref 13.0–17.0)
Immature Granulocytes: 1 %
Lymphocytes Relative: 35 %
Lymphs Abs: 2.2 10*3/uL (ref 0.7–4.0)
MCH: 31 pg (ref 26.0–34.0)
MCHC: 32.9 g/dL (ref 30.0–36.0)
MCV: 94.2 fL (ref 80.0–100.0)
Monocytes Absolute: 0.6 10*3/uL (ref 0.1–1.0)
Monocytes Relative: 10 %
Neutro Abs: 3.2 10*3/uL (ref 1.7–7.7)
Neutrophils Relative %: 49 %
Platelets: 180 10*3/uL (ref 150–400)
RBC: 4.68 MIL/uL (ref 4.22–5.81)
RDW: 12.2 % (ref 11.5–15.5)
WBC: 6.4 10*3/uL (ref 4.0–10.5)
nRBC: 0 % (ref 0.0–0.2)

## 2020-01-01 MED ORDER — SODIUM CHLORIDE 0.9% FLUSH
10.0000 mL | Freq: Once | INTRAVENOUS | Status: AC
  Administered 2020-01-01: 10 mL via INTRAVENOUS
  Filled 2020-01-01: qty 10

## 2020-01-01 MED ORDER — HEPARIN SOD (PORK) LOCK FLUSH 100 UNIT/ML IV SOLN
500.0000 [IU] | Freq: Once | INTRAVENOUS | Status: AC | PRN
  Administered 2020-01-01: 500 [IU]
  Filled 2020-01-01: qty 5

## 2020-01-01 MED ORDER — SODIUM CHLORIDE 0.9 % IV SOLN
500.0000 mg/m2 | Freq: Once | INTRAVENOUS | Status: AC
  Administered 2020-01-01: 1100 mg via INTRAVENOUS
  Filled 2020-01-01: qty 40

## 2020-01-01 MED ORDER — SODIUM CHLORIDE 0.9 % IV SOLN
Freq: Once | INTRAVENOUS | Status: AC
  Filled 2020-01-01: qty 250

## 2020-01-01 MED ORDER — PROCHLORPERAZINE MALEATE 10 MG PO TABS
10.0000 mg | ORAL_TABLET | Freq: Once | ORAL | Status: AC
  Administered 2020-01-01: 10 mg via ORAL
  Filled 2020-01-01: qty 1

## 2020-01-01 MED ORDER — DEXAMETHASONE SODIUM PHOSPHATE 10 MG/ML IJ SOLN
6.0000 mg | Freq: Once | INTRAMUSCULAR | Status: AC
  Administered 2020-01-01: 6 mg via INTRAVENOUS
  Filled 2020-01-01: qty 1

## 2020-01-01 NOTE — Assessment & Plan Note (Signed)
#   Adenocarcinoma the lung; stage IV; NOV 30th 2020-CT scan chest/a/p-shows no evidence of metastatic disease; STABLE.   # Proceed with Alimta chemotherapy. Labs today reviewed;  acceptable for treatment today.   # port flush- dye study; TPA if needed  # DISPOSITION:  # Dye study # Alimta today # Follow up in 3 weeks MD-labs- cbc/cmp; alimta- Dr.B

## 2020-01-04 ENCOUNTER — Ambulatory Visit: Payer: BC Managed Care – PPO | Attending: Internal Medicine

## 2020-01-05 NOTE — Progress Notes (Signed)
[place] OFFICE PROGRESS NOTE  Patient Care Team: [name], MD as PCP - General [facility])  Cancer Staging No matching staging information was found for the patient.   Oncology History Overview Note  # 2012- METASTATIC ADENO CA of LEFT LUNG [acinar pattern] s/p MED LN Bx [NEG- EGFR/K-ras/ALK mutation];[name]; [name]; CT JULY  2016- NED; JAN 2018- NED;   # AUG 2020-clinical trial closed; continue maintenance Alimta  # Thoracic aortic aneurysm-4.0 cm in 2016; currently 4.6 cm.  s/p referral to Dr. Faith Rogue.; MRI in 6 months planned.  ----------------------------------------------------------    DIAGNOSIS: [name]  STAGE: 4       ;GOALS: Palliative  CURRENT/MOST RECENT THERAPY '[ ]'  Alimta maintenance    Cancer of hilus of left lung ([place])  07/20/2016 Initial Diagnosis   Cancer of hilus of left lung ([place])   12/11/2019 -  Chemotherapy   The patient had PEMEtrexed (ALIMTA) 1,100 mg in sodium chloride 0.9 % 100 mL chemo infusion, 500 mg/m2 = 1,100 mg, Intravenous,  Once, 2 of 9 cycles Administration: 1,100 mg (12/11/2019), 1,100 mg (01/01/2020)  for chemotherapy treatment.       INTERVAL HISTORY:  Mitchell Mosley 59 y.o.  male pleasant patient above history of metastatic adenocarcinoma the lung-NED currently on maintenance Alimta is here for follow-up.  Patient continues to feel overall well.  No recent exposure to Covid.  No Covid-like symptoms.  Chronic mild joint pains mild swelling in the legs not any worse.   Review of Systems  Constitutional: Negative for chills, diaphoresis, fever, malaise/fatigue and weight loss.  HENT: Negative for nosebleeds and sore throat.   Eyes: Negative for double vision.  Respiratory: Negative for hemoptysis, shortness of breath and wheezing.   Cardiovascular: Positive for leg swelling.  Negative for chest pain, palpitations and orthopnea.  Gastrointestinal: Negative for abdominal pain, blood in stool, constipation, diarrhea, heartburn, melena, nausea and vomiting.  Genitourinary: Negative for dysuria, frequency and urgency.  Musculoskeletal: Positive for joint pain.  Skin: Negative.  Negative for itching and rash.  Neurological: Negative for dizziness, tingling, focal weakness, weakness and headaches.  Endo/Heme/Allergies: Does not bruise/bleed easily.  Psychiatric/Behavioral: Negative for depression. The patient is not nervous/anxious and does not have insomnia.       PAST MEDICAL HISTORY :  Past Medical History:  Diagnosis Date  . Allergy   . Lung cancer ([place])   . Mild valvular heart disease Nov. 2014   per 2 D Echocardiogram done for persistent leg edema, monitored by cardiology  . Peripheral edema     PAST SURGICAL HISTORY :   Past Surgical History:  Procedure Laterality Date  . ELBOW SURGERY Right 2011  . Left anterior thoracotomy with biopsy  March 2012  . PORTACATH PLACEMENT  March 2012  . TONSILLECTOMY  as child    FAMILY HISTORY :   Family History  Problem Relation Age of Onset  . Leukemia Father   . Diabetes Mother     SOCIAL HISTORY:   Social History   Tobacco Use  . Smoking status: Never Smoker  . Smokeless tobacco: Never Used  Substance Use Topics  . Alcohol use: Yes    Alcohol/week: 0.0 standard drinks    Comment: "social drinker"   . Drug use: No    ALLERGIES:  is allergic to penicillins.  MEDICATIONS:  Current Outpatient Medications  Medication Sig Dispense Refill  .  folic acid (FOLVITE) 1 MG tablet Take 1 tablet (1 mg total) by mouth daily. 90 tablet 3  . furosemide (LASIX) 20 MG tablet TAKE 1 TABLET (20 MG TOTAL) BY MOUTH DAILY AS NEEDED FOR SWELLING 90 tablet 1  . loratadine (CLARITIN) 10 MG tablet Take 1 tablet (10 mg total) by mouth daily.    . meloxicam (MOBIC) 7.5 MG tablet Take 7.5 mg by mouth daily.     . Multiple  Vitamin (MULTIVITAMIN) capsule Take 1 capsule by mouth daily. Reported on 02/24/2016    . Omega-3 Fatty Acids (FISH OIL) 1000 MG CAPS Take 1 capsule by mouth daily. Reported on 02/24/2016    . senna (SENOKOT) 8.6 MG tablet Take 1 tablet by mouth daily. Take as needed around time of chemotherapy    . spironolactone (ALDACTONE) 25 MG tablet TAKE 1 TABLET BY MOUTH EVERY DAY 90 tablet 3  . tadalafil (CIALIS) 20 MG tablet TAKE 1 TABLET BY MOUTH EVERY 2 DAYS AS NEEDED. 6 tablet 1  . vitamin B-12 (CYANOCOBALAMIN) 1000 MCG tablet Take 1,000 mcg by mouth daily.     No current facility-administered medications for this visit.   Facility-Administered Medications Ordered in Other Visits  Medication Dose Route Frequency Provider Last Rate Last Admin  . sodium chloride 0.9 % injection 10 mL  10 mL Intracatheter PRN [name], MD   10 mL at 05/27/15 1543  . sodium chloride 0.9 % injection 10 mL  10 mL Intracatheter PRN [name], MD   10 mL at 06/17/15 1510  . sodium chloride 0.9 % injection 10 mL  10 mL Intravenous PRN [name], MD   10 mL at 07/08/15 1340    PHYSICAL EXAMINATION: ECOG PERFORMANCE STATUS: 0 - Asymptomatic  BP 122/84   Pulse 82   Temp (!) 97.2 F (36.2 C) (Tympanic)   Resp 20   Ht '5\' 10"'  (1.778 m)   Wt 220 lb (99.8 kg)   BMI 31.57 kg/m   Filed Weights   01/01/20 1313  Weight: 220 lb (99.8 kg)    Physical Exam  Constitutional: He is oriented to person, place, and time and well-developed, well-nourished, and in no distress.  HENT:  Head: Normocephalic and atraumatic.  Mouth/Throat: Oropharynx is clear and moist. No oropharyngeal exudate.  Eyes: Pupils are equal, round, and reactive to light.  Cardiovascular: Normal rate and regular rhythm.  Pulmonary/Chest: No respiratory distress. He has no wheezes.  Abdominal: Soft. Bowel sounds are normal. He exhibits no distension and no mass. There is no abdominal tenderness. There is no rebound and no guarding.   Musculoskeletal:        General: Edema present. No tenderness. Normal range of motion.     Cervical back: Normal range of motion and neck supple.     Comments: Grade 1 swelling in the legs bilaterally.  Neurological: He is alert and oriented to person, place, and time.  Skin: Skin is warm.  Psychiatric: Affect normal.     LABORATORY DATA:  I have reviewed the data as listed    Component Value Date/Time   NA 135 01/01/2020 1251   NA 138 12/10/2014 1341   K 4.1 01/01/2020 1251   K 3.6 12/10/2014 1341   CL 98 01/01/2020 1251   CL 102 12/10/2014 1341   CO2 28 01/01/2020 1251   CO2 30 12/10/2014 1341   GLUCOSE 148 (H) 01/01/2020 1251   GLUCOSE 160 (H) 12/10/2014 1341   BUN 18 01/01/2020 1251   BUN 21 (  H) 12/10/2014 1341   CREATININE 1.22 01/01/2020 1251   CREATININE 1.10 03/25/2015 1453   CALCIUM 9.5 01/01/2020 1251   CALCIUM 8.4 (L) 12/10/2014 1341   PROT 7.3 01/01/2020 1251   PROT 6.6 12/10/2014 1341   ALBUMIN 4.0 01/01/2020 1251   ALBUMIN 3.4 12/10/2014 1341   AST 23 01/01/2020 1251   AST 17 12/10/2014 1341   ALT 21 01/01/2020 1251   ALT 23 12/10/2014 1341   ALKPHOS 48 01/01/2020 1251   ALKPHOS 50 12/10/2014 1341   BILITOT 0.7 01/01/2020 1251   BILITOT 0.5 12/10/2014 1341   GFRNONAA >60 01/01/2020 1251   GFRNONAA >60 03/25/2015 1453   GFRAA >60 01/01/2020 1251   GFRAA >60 03/25/2015 1453    No results found for: SPEP, UPEP  Lab Results  Component Value Date   WBC 6.4 01/01/2020   NEUTROABS 3.2 01/01/2020   HGB 14.5 01/01/2020   HCT 44.1 01/01/2020   MCV 94.2 01/01/2020   PLT 180 01/01/2020      Chemistry      Component Value Date/Time   NA 135 01/01/2020 1251   NA 138 12/10/2014 1341   K 4.1 01/01/2020 1251   K 3.6 12/10/2014 1341   CL 98 01/01/2020 1251   CL 102 12/10/2014 1341   CO2 28 01/01/2020 1251   CO2 30 12/10/2014 1341   BUN 18 01/01/2020 1251   BUN 21 (H) 12/10/2014 1341   CREATININE 1.22 01/01/2020 1251   CREATININE 1.10 03/25/2015  1453      Component Value Date/Time   CALCIUM 9.5 01/01/2020 1251   CALCIUM 8.4 (L) 12/10/2014 1341   ALKPHOS 48 01/01/2020 1251   ALKPHOS 50 12/10/2014 1341   AST 23 01/01/2020 1251   AST 17 12/10/2014 1341   ALT 21 01/01/2020 1251   ALT 23 12/10/2014 1341   BILITOT 0.7 01/01/2020 1251   BILITOT 0.5 12/10/2014 1341       RADIOGRAPHIC STUDIES: I have personally reviewed the radiological images as listed and agreed with the findings in the report. No results found.   ASSESSMENT & PLAN:  Cancer of hilus of left lung ([place]) # Adenocarcinoma the lung; stage IV; NOV 30th 2020-CT scan chest/a/p-shows no evidence of metastatic disease; STABLE.   # Proceed with Alimta chemotherapy. Labs today reviewed;  acceptable for treatment today.   # port flush- dye study; TPA if needed  # DISPOSITION:  # Dye study # Alimta today # Follow up in 3 weeks MD-labs- cbc/cmp; alimta- Dr.B   Orders Placed This Encounter  Procedures  . DG Fluoro Guide CV Line Left    Standing Status:   Future    Standing Expiration Date:   02/28/2021    Order Specific Question:   Reason for Exam (SYMPTOM  OR DIAGNOSIS REQUIRED)    Answer:   no blood return from port a cath    Order Specific Question:   Preferred imaging location?    Answer:   [facility]   All questions were answered. The patient knows to call the clinic with any problems, questions or concerns.      [name], MD 01/05/2020 4:37 PM

## 2020-01-22 ENCOUNTER — Other Ambulatory Visit: Payer: Self-pay

## 2020-01-22 ENCOUNTER — Inpatient Hospital Stay (HOSPITAL_BASED_OUTPATIENT_CLINIC_OR_DEPARTMENT_OTHER): Payer: BC Managed Care – PPO | Admitting: Internal Medicine

## 2020-01-22 ENCOUNTER — Other Ambulatory Visit: Payer: Self-pay | Admitting: Family Medicine

## 2020-01-22 ENCOUNTER — Inpatient Hospital Stay: Payer: BC Managed Care – PPO

## 2020-01-22 ENCOUNTER — Inpatient Hospital Stay: Payer: BC Managed Care – PPO | Attending: Internal Medicine

## 2020-01-22 DIAGNOSIS — Z79899 Other long term (current) drug therapy: Secondary | ICD-10-CM | POA: Insufficient documentation

## 2020-01-22 DIAGNOSIS — Z791 Long term (current) use of non-steroidal anti-inflammatories (NSAID): Secondary | ICD-10-CM | POA: Insufficient documentation

## 2020-01-22 DIAGNOSIS — M7989 Other specified soft tissue disorders: Secondary | ICD-10-CM | POA: Diagnosis not present

## 2020-01-22 DIAGNOSIS — Z5111 Encounter for antineoplastic chemotherapy: Secondary | ICD-10-CM

## 2020-01-22 DIAGNOSIS — Z833 Family history of diabetes mellitus: Secondary | ICD-10-CM | POA: Insufficient documentation

## 2020-01-22 DIAGNOSIS — C3402 Malignant neoplasm of left main bronchus: Secondary | ICD-10-CM | POA: Diagnosis not present

## 2020-01-22 DIAGNOSIS — Z95828 Presence of other vascular implants and grafts: Secondary | ICD-10-CM

## 2020-01-22 LAB — CBC WITH DIFFERENTIAL/PLATELET
Abs Immature Granulocytes: 0.03 10*3/uL (ref 0.00–0.07)
Basophils Absolute: 0 10*3/uL (ref 0.0–0.1)
Basophils Relative: 1 %
Eosinophils Absolute: 0.2 10*3/uL (ref 0.0–0.5)
Eosinophils Relative: 3 %
HCT: 40.5 % (ref 39.0–52.0)
Hemoglobin: 13.4 g/dL (ref 13.0–17.0)
Immature Granulocytes: 1 %
Lymphocytes Relative: 33 %
Lymphs Abs: 2 10*3/uL (ref 0.7–4.0)
MCH: 31.2 pg (ref 26.0–34.0)
MCHC: 33.1 g/dL (ref 30.0–36.0)
MCV: 94.2 fL (ref 80.0–100.0)
Monocytes Absolute: 0.6 10*3/uL (ref 0.1–1.0)
Monocytes Relative: 11 %
Neutro Abs: 3.1 10*3/uL (ref 1.7–7.7)
Neutrophils Relative %: 51 %
Platelets: 166 10*3/uL (ref 150–400)
RBC: 4.3 MIL/uL (ref 4.22–5.81)
RDW: 12.6 % (ref 11.5–15.5)
WBC: 6 10*3/uL (ref 4.0–10.5)
nRBC: 0 % (ref 0.0–0.2)

## 2020-01-22 LAB — COMPREHENSIVE METABOLIC PANEL
ALT: 16 U/L (ref 0–44)
AST: 19 U/L (ref 15–41)
Albumin: 3.8 g/dL (ref 3.5–5.0)
Alkaline Phosphatase: 40 U/L (ref 38–126)
Anion gap: 11 (ref 5–15)
BUN: 18 mg/dL (ref 6–20)
CO2: 24 mmol/L (ref 22–32)
Calcium: 8.9 mg/dL (ref 8.9–10.3)
Chloride: 103 mmol/L (ref 98–111)
Creatinine, Ser: 1.05 mg/dL (ref 0.61–1.24)
GFR calc Af Amer: >60 mL/min (ref >60)
GFR calc non Af Amer: >60 mL/min (ref >60)
Glucose, Bld: 119 mg/dL — ABNORMAL HIGH (ref 70–99)
Potassium: 3.7 mmol/L (ref 3.5–5.1)
Sodium: 138 mmol/L (ref 135–145)
Total Bilirubin: 0.5 mg/dL (ref 0.3–1.2)
Total Protein: 6.9 g/dL (ref 6.5–8.1)

## 2020-01-22 MED ORDER — HEPARIN SOD (PORK) LOCK FLUSH 100 UNIT/ML IV SOLN
500.0000 [IU] | Freq: Once | INTRAVENOUS | Status: AC | PRN
  Administered 2020-01-22: 500 [IU]
  Filled 2020-01-22: qty 5

## 2020-01-22 MED ORDER — SODIUM CHLORIDE 0.9 % IV SOLN
Freq: Once | INTRAVENOUS | Status: AC
  Filled 2020-01-22: qty 250

## 2020-01-22 MED ORDER — SODIUM CHLORIDE 0.9 % IV SOLN
500.0000 mg/m2 | Freq: Once | INTRAVENOUS | Status: AC
  Administered 2020-01-22: 1100 mg via INTRAVENOUS
  Filled 2020-01-22: qty 40

## 2020-01-22 MED ORDER — HEPARIN SOD (PORK) LOCK FLUSH 100 UNIT/ML IV SOLN
INTRAVENOUS | Status: AC
  Filled 2020-01-22: qty 5

## 2020-01-22 MED ORDER — SODIUM CHLORIDE 0.9% FLUSH
10.0000 mL | Freq: Once | INTRAVENOUS | Status: AC
  Administered 2020-01-22: 13:00:00 10 mL via INTRAVENOUS
  Filled 2020-01-22: qty 10

## 2020-01-22 MED ORDER — CYANOCOBALAMIN 1000 MCG/ML IJ SOLN
1000.0000 ug | Freq: Once | INTRAMUSCULAR | Status: AC
  Administered 2020-01-22: 14:00:00 1000 ug via INTRAMUSCULAR
  Filled 2020-01-22: qty 1

## 2020-01-22 MED ORDER — DEXAMETHASONE SODIUM PHOSPHATE 10 MG/ML IJ SOLN
6.0000 mg | Freq: Once | INTRAMUSCULAR | Status: AC
  Administered 2020-01-22: 6 mg via INTRAVENOUS
  Filled 2020-01-22: qty 1

## 2020-01-22 MED ORDER — PROCHLORPERAZINE MALEATE 10 MG PO TABS
10.0000 mg | ORAL_TABLET | Freq: Once | ORAL | Status: AC
  Administered 2020-01-22: 10 mg via ORAL
  Filled 2020-01-22: qty 1

## 2020-01-22 NOTE — Assessment & Plan Note (Addendum)
#   Adenocarcinoma the lung; stage IV; NOV 30th 2020-CT scan chest/a/p-shows no evidence of metastatic disease; stable.  # Proceed with Alimta chemotherapy. Labs today reviewed;  acceptable for treatment today.   # swelling in the hands/feet-grade 1 asymptomatic.? Alimta; monitor for now if worse-would consider dexamethasone premedication.  Continue Lasix as needed  # DISPOSITION:  # Alimta today # Follow up in 3 weeks MD-labs- cbc/cmp; alimta- Dr.B

## 2020-01-22 NOTE — Progress Notes (Signed)
[facility] OFFICE PROGRESS NOTE  Patient Care Team: Gilbert, Richard L Jr., MD as PCP - General (Family Medicine)  Cancer Staging No matching staging information was found for the patient.   Oncology History Overview Note  # 2012- METASTATIC ADENO CA of LEFT LUNG [acinar pattern] s/p MED LN Bx [NEG- EGFR/K-ras/ALK mutation];[name]; [name]; CT JULY  2016- NED; JAN 2018- NED;   # AUG 2020-clinical trial closed; continue maintenance Alimta  # Thoracic aortic aneurysm-4.0 cm in 2016; currently 4.6 cm.  s/p referral to Dr. Oakes.; MRI in 6 months planned.  ----------------------------------------------------------    DIAGNOSIS: [2012 ] Adenocarcinoma lung  STAGE: 4       ;GOALS: Palliative  CURRENT/MOST RECENT THERAPY [ ] Alimta maintenance    Cancer of hilus of left lung (HCC)  07/20/2016 Initial Diagnosis   Cancer of hilus of left lung (HCC)   12/11/2019 -  Chemotherapy   The patient had PEMEtrexed (ALIMTA) 1,100 mg in sodium chloride 0.9 % 100 mL chemo infusion, 500 mg/m2 = 1,100 mg, Intravenous,  Once, 3 of 9 cycles Administration: 1,100 mg (12/11/2019), 1,100 mg (01/22/2020), 1,100 mg (01/01/2020)  for chemotherapy treatment.       INTERVAL HISTORY:  Mitchell Mosley 59 y.o.  male pleasant patient above history of metastatic adenocarcinoma the lung-NED currently on maintenance Alimta is here for follow-up.  Complains of mild swelling in the hands bilaterally compared to the feet.  Mild swelling in the legs not any worse.  No fever no chills.  No worsening shortness of breath or cough no headaches.  Review of Systems  Constitutional: Negative for chills, diaphoresis, fever, malaise/fatigue and weight loss.  HENT: Negative for nosebleeds and sore throat.   Eyes: Negative for double vision.  Respiratory: Negative for hemoptysis, shortness of breath and  wheezing.   Cardiovascular: Positive for leg swelling. Negative for chest pain, palpitations and orthopnea.  Gastrointestinal: Negative for abdominal pain, blood in stool, constipation, diarrhea, heartburn, melena, nausea and vomiting.  Genitourinary: Negative for dysuria, frequency and urgency.  Musculoskeletal: Positive for joint pain.  Skin: Negative.  Negative for itching and rash.  Neurological: Negative for dizziness, tingling, focal weakness, weakness and headaches.  Endo/Heme/Allergies: Does not bruise/bleed easily.  Psychiatric/Behavioral: Negative for depression. The patient is not nervous/anxious and does not have insomnia.       PAST MEDICAL HISTORY :  Past Medical History:  Diagnosis Date  . Allergy   . Lung cancer (HCC)   . Mild valvular heart disease Nov. 2014   per 2 D Echocardiogram done for persistent leg edema, monitored by cardiology  . Peripheral edema     PAST SURGICAL HISTORY :   Past Surgical History:  Procedure Laterality Date  . ELBOW SURGERY Right 2011  . Left anterior thoracotomy with biopsy  March 2012  . PORTACATH PLACEMENT  March 2012  . TONSILLECTOMY  as child    FAMILY HISTORY :   Family History  Problem Relation Age of Onset  . Leukemia Father   . Diabetes Mother     SOCIAL HISTORY:   Social History   Tobacco Use  . Smoking status: Never Smoker  . Smokeless tobacco: Never Used  Substance Use Topics  . Alcohol use: Yes    Alcohol/week: 0.0 standard drinks    Comment: "social drinker"   . Drug use: No    ALLERGIES:  is allergic to penicillins.  MEDICATIONS:    Current Outpatient Medications  Medication Sig Dispense Refill  . folic acid (FOLVITE) 1 MG tablet Take 1 tablet (1 mg total) by mouth daily. 90 tablet 3  . furosemide (LASIX) 20 MG tablet TAKE 1 TABLET (20 MG TOTAL) BY MOUTH DAILY AS NEEDED FOR SWELLING 90 tablet 1  . loratadine (CLARITIN) 10 MG tablet Take 1 tablet (10 mg total) by mouth daily.    . meloxicam (MOBIC) 7.5  MG tablet Take 7.5 mg by mouth daily.     . Multiple Vitamin (MULTIVITAMIN) capsule Take 1 capsule by mouth daily. Reported on 02/24/2016    . Omega-3 Fatty Acids (FISH OIL) 1000 MG CAPS Take 1 capsule by mouth daily. Reported on 02/24/2016    . senna (SENOKOT) 8.6 MG tablet Take 1 tablet by mouth daily. Take as needed around time of chemotherapy    . spironolactone (ALDACTONE) 25 MG tablet TAKE 1 TABLET BY MOUTH EVERY DAY 90 tablet 3  . tadalafil (CIALIS) 20 MG tablet TAKE 1 TABLET BY MOUTH EVERY 2 DAYS AS NEEDED. 6 tablet 1  . vitamin B-12 (CYANOCOBALAMIN) 1000 MCG tablet Take 1,000 mcg by mouth daily.     No current facility-administered medications for this visit.   Facility-Administered Medications Ordered in Other Visits  Medication Dose Route Frequency Provider Last Rate Last Admin  . sodium chloride 0.9 % injection 10 mL  10 mL Intracatheter PRN [name], MD   10 mL at 05/27/15 1543  . sodium chloride 0.9 % injection 10 mL  10 mL Intracatheter PRN [name], MD   10 mL at 06/17/15 1510  . sodium chloride 0.9 % injection 10 mL  10 mL Intravenous PRN [name], MD   10 mL at 07/08/15 1340    PHYSICAL EXAMINATION: ECOG PERFORMANCE STATUS: 0 - Asymptomatic  BP 116/74   Pulse 75   Temp (!) 97 F (36.1 C) (Tympanic)   Resp 20   Ht 5' 10" (1.778 m)   Wt 219 lb (99.3 kg)   BMI 31.42 kg/m   Filed Weights   01/22/20 1337  Weight: 219 lb (99.3 kg)    Physical Exam  Constitutional: He is oriented to person, place, and time and well-developed, well-nourished, and in no distress.  HENT:  Head: Normocephalic and atraumatic.  Mouth/Throat: Oropharynx is clear and moist. No oropharyngeal exudate.  Eyes: Pupils are equal, round, and reactive to light.  Cardiovascular: Normal rate and regular rhythm.  Pulmonary/Chest: No respiratory distress. He has no wheezes.  Abdominal: Soft. Bowel sounds are normal. He exhibits no distension and no mass. There is no abdominal  tenderness. There is no rebound and no guarding.  Musculoskeletal:        General: Edema present. No tenderness. Normal range of motion.     Cervical back: Normal range of motion and neck supple.     Comments: Grade 1 swelling in the legs bilaterally.  Neurological: He is alert and oriented to person, place, and time.  Skin: Skin is warm.  Psychiatric: Affect normal.     LABORATORY DATA:  I have reviewed the data as listed    Component Value Date/Time   NA 138 01/22/2020 1319   NA 138 12/10/2014 1341   K 3.7 01/22/2020 1319   K 3.6 12/10/2014 1341   CL 103 01/22/2020 1319   CL 102 12/10/2014 1341   CO2 24 01/22/2020 1319   CO2 30 12/10/2014 1341   GLUCOSE 119 (H) 01/22/2020 1319   GLUCOSE 160 (H) 12/10/2014 1341  BUN 18 01/22/2020 1319   BUN 21 (H) 12/10/2014 1341   CREATININE 1.05 01/22/2020 1319   CREATININE 1.10 03/25/2015 1453   CALCIUM 8.9 01/22/2020 1319   CALCIUM 8.4 (L) 12/10/2014 1341   PROT 6.9 01/22/2020 1319   PROT 6.6 12/10/2014 1341   ALBUMIN 3.8 01/22/2020 1319   ALBUMIN 3.4 12/10/2014 1341   AST 19 01/22/2020 1319   AST 17 12/10/2014 1341   ALT 16 01/22/2020 1319   ALT 23 12/10/2014 1341   ALKPHOS 40 01/22/2020 1319   ALKPHOS 50 12/10/2014 1341   BILITOT 0.5 01/22/2020 1319   BILITOT 0.5 12/10/2014 1341   GFRNONAA >60 01/22/2020 1319   GFRNONAA >60 03/25/2015 1453   GFRAA >60 01/22/2020 1319   GFRAA >60 03/25/2015 1453    No results found for: SPEP, UPEP  Lab Results  Component Value Date   WBC 6.0 01/22/2020   NEUTROABS 3.1 01/22/2020   HGB 13.4 01/22/2020   HCT 40.5 01/22/2020   MCV 94.2 01/22/2020   PLT 166 01/22/2020      Chemistry      Component Value Date/Time   NA 138 01/22/2020 1319   NA 138 12/10/2014 1341   K 3.7 01/22/2020 1319   K 3.6 12/10/2014 1341   CL 103 01/22/2020 1319   CL 102 12/10/2014 1341   CO2 24 01/22/2020 1319   CO2 30 12/10/2014 1341   BUN 18 01/22/2020 1319   BUN 21 (H) 12/10/2014 1341   CREATININE  1.05 01/22/2020 1319   CREATININE 1.10 03/25/2015 1453      Component Value Date/Time   CALCIUM 8.9 01/22/2020 1319   CALCIUM 8.4 (L) 12/10/2014 1341   ALKPHOS 40 01/22/2020 1319   ALKPHOS 50 12/10/2014 1341   AST 19 01/22/2020 1319   AST 17 12/10/2014 1341   ALT 16 01/22/2020 1319   ALT 23 12/10/2014 1341   BILITOT 0.5 01/22/2020 1319   BILITOT 0.5 12/10/2014 1341       RADIOGRAPHIC STUDIES: I have personally reviewed the radiological images as listed and agreed with the findings in the report. No results found.   ASSESSMENT & PLAN:  Cancer of hilus of left lung ([place]) # Adenocarcinoma the lung; stage IV; NOV 30th 2020-CT scan chest/a/p-shows no evidence of metastatic disease; stable.  # Proceed with Alimta chemotherapy. Labs today reviewed;  acceptable for treatment today.   # swelling in the hands/feet-grade 1 asymptomatic.? Alimta; monitor for now if worse-would consider dexamethasone premedication.  Continue Lasix as needed  # DISPOSITION:  # Alimta today # Follow up in 3 weeks MD-labs- cbc/cmp; alimta- Dr.B   No orders of the defined types were placed in this encounter.  All questions were answered. The patient knows to call the clinic with any problems, questions or concerns.      [name], MD 01/25/2020 7:21 AM

## 2020-01-27 ENCOUNTER — Other Ambulatory Visit: Payer: Self-pay | Admitting: Internal Medicine

## 2020-02-12 ENCOUNTER — Inpatient Hospital Stay: Payer: BC Managed Care – PPO

## 2020-02-12 ENCOUNTER — Inpatient Hospital Stay: Payer: BC Managed Care – PPO | Attending: Internal Medicine

## 2020-02-12 ENCOUNTER — Encounter: Payer: Self-pay | Admitting: Internal Medicine

## 2020-02-12 ENCOUNTER — Inpatient Hospital Stay (HOSPITAL_BASED_OUTPATIENT_CLINIC_OR_DEPARTMENT_OTHER): Payer: BC Managed Care – PPO | Admitting: Internal Medicine

## 2020-02-12 ENCOUNTER — Other Ambulatory Visit: Payer: Self-pay

## 2020-02-12 DIAGNOSIS — C3402 Malignant neoplasm of left main bronchus: Secondary | ICD-10-CM | POA: Diagnosis not present

## 2020-02-12 DIAGNOSIS — Z5111 Encounter for antineoplastic chemotherapy: Secondary | ICD-10-CM

## 2020-02-12 DIAGNOSIS — Z833 Family history of diabetes mellitus: Secondary | ICD-10-CM | POA: Insufficient documentation

## 2020-02-12 DIAGNOSIS — Z79899 Other long term (current) drug therapy: Secondary | ICD-10-CM | POA: Diagnosis not present

## 2020-02-12 DIAGNOSIS — Z791 Long term (current) use of non-steroidal anti-inflammatories (NSAID): Secondary | ICD-10-CM | POA: Insufficient documentation

## 2020-02-12 DIAGNOSIS — M7989 Other specified soft tissue disorders: Secondary | ICD-10-CM | POA: Insufficient documentation

## 2020-02-12 DIAGNOSIS — Z95828 Presence of other vascular implants and grafts: Secondary | ICD-10-CM

## 2020-02-12 LAB — COMPREHENSIVE METABOLIC PANEL
ALT: 15 U/L (ref 0–44)
AST: 21 U/L (ref 15–41)
Albumin: 3.9 g/dL (ref 3.5–5.0)
Alkaline Phosphatase: 41 U/L (ref 38–126)
Anion gap: 10 (ref 5–15)
BUN: 24 mg/dL — ABNORMAL HIGH (ref 6–20)
CO2: 24 mmol/L (ref 22–32)
Calcium: 8.8 mg/dL — ABNORMAL LOW (ref 8.9–10.3)
Chloride: 100 mmol/L (ref 98–111)
Creatinine, Ser: 0.99 mg/dL (ref 0.61–1.24)
GFR calc Af Amer: >60 mL/min (ref >60)
GFR calc non Af Amer: >60 mL/min (ref >60)
Glucose, Bld: 192 mg/dL — ABNORMAL HIGH (ref 70–99)
Potassium: 3.7 mmol/L (ref 3.5–5.1)
Sodium: 134 mmol/L — ABNORMAL LOW (ref 135–145)
Total Bilirubin: 0.9 mg/dL (ref 0.3–1.2)
Total Protein: 7.2 g/dL (ref 6.5–8.1)

## 2020-02-12 LAB — CBC WITH DIFFERENTIAL/PLATELET
Abs Immature Granulocytes: 0.02 10*3/uL (ref 0.00–0.07)
Basophils Absolute: 0 10*3/uL (ref 0.0–0.1)
Basophils Relative: 1 %
Eosinophils Absolute: 0.1 10*3/uL (ref 0.0–0.5)
Eosinophils Relative: 3 %
HCT: 40.6 % (ref 39.0–52.0)
Hemoglobin: 13.7 g/dL (ref 13.0–17.0)
Immature Granulocytes: 0 %
Lymphocytes Relative: 31 %
Lymphs Abs: 1.6 10*3/uL (ref 0.7–4.0)
MCH: 31 pg (ref 26.0–34.0)
MCHC: 33.7 g/dL (ref 30.0–36.0)
MCV: 91.9 fL (ref 80.0–100.0)
Monocytes Absolute: 0.5 10*3/uL (ref 0.1–1.0)
Monocytes Relative: 9 %
Neutro Abs: 2.9 10*3/uL (ref 1.7–7.7)
Neutrophils Relative %: 56 %
Platelets: 155 10*3/uL (ref 150–400)
RBC: 4.42 MIL/uL (ref 4.22–5.81)
RDW: 13 % (ref 11.5–15.5)
WBC: 5.2 10*3/uL (ref 4.0–10.5)
nRBC: 0 % (ref 0.0–0.2)

## 2020-02-12 MED ORDER — PROCHLORPERAZINE MALEATE 10 MG PO TABS
10.0000 mg | ORAL_TABLET | Freq: Once | ORAL | Status: AC
  Administered 2020-02-12: 10 mg via ORAL
  Filled 2020-02-12: qty 1

## 2020-02-12 MED ORDER — SODIUM CHLORIDE 0.9 % IV SOLN
500.0000 mg/m2 | Freq: Once | INTRAVENOUS | Status: AC
  Administered 2020-02-12: 1100 mg via INTRAVENOUS
  Filled 2020-02-12: qty 4

## 2020-02-12 MED ORDER — SODIUM CHLORIDE 0.9% FLUSH
10.0000 mL | Freq: Once | INTRAVENOUS | Status: AC
  Administered 2020-02-12: 10 mL via INTRAVENOUS
  Filled 2020-02-12: qty 10

## 2020-02-12 MED ORDER — HEPARIN SOD (PORK) LOCK FLUSH 100 UNIT/ML IV SOLN
INTRAVENOUS | Status: AC
  Filled 2020-02-12: qty 5

## 2020-02-12 MED ORDER — DEXAMETHASONE SODIUM PHOSPHATE 10 MG/ML IJ SOLN
6.0000 mg | Freq: Once | INTRAMUSCULAR | Status: AC
  Administered 2020-02-12: 6 mg via INTRAVENOUS
  Filled 2020-02-12: qty 1

## 2020-02-12 MED ORDER — HEPARIN SOD (PORK) LOCK FLUSH 100 UNIT/ML IV SOLN
500.0000 [IU] | Freq: Once | INTRAVENOUS | Status: AC | PRN
  Administered 2020-02-12: 500 [IU]
  Filled 2020-02-12: qty 5

## 2020-02-12 MED ORDER — SODIUM CHLORIDE 0.9 % IV SOLN
Freq: Once | INTRAVENOUS | Status: AC
  Filled 2020-02-12: qty 250

## 2020-02-12 MED ORDER — CYANOCOBALAMIN 1000 MCG/ML IJ SOLN
1000.0000 ug | Freq: Once | INTRAMUSCULAR | Status: DC
  Filled 2020-02-12: qty 1

## 2020-02-12 NOTE — Progress Notes (Signed)
[place] OFFICE PROGRESS NOTE  Patient Care Team: [name], MD as PCP - General [facility])  Cancer Staging No matching staging information was found for the patient.   Oncology History Overview Note  # 2012- METASTATIC ADENO CA of LEFT LUNG [acinar pattern] s/p MED LN Bx [NEG- EGFR/K-ras/ALK mutation];[name]; [name]; CT JULY  2016- NED; JAN 2018- NED;   # AUG 2020-clinical trial closed; continue maintenance Alimta  # Thoracic aortic aneurysm-4.0 cm in 2016; currently 4.6 cm.  s/p referral to Dr. Faith Rogue.; MRI in 6 months planned.  ----------------------------------------------------------    DIAGNOSIS: Mitchell Mosley ] Adenocarcinoma lung  STAGE: 4       ;GOALS: Palliative  CURRENT/MOST RECENT THERAPY _0  Alimta maintenance    Cancer of hilus of left lung ([place])  07/20/2016 Initial Diagnosis   Cancer of hilus of left lung ([place])   12/11/2019 -  Chemotherapy   The patient had PEMEtrexed (ALIMTA) 1,100 mg in sodium chloride 0.9 % 100 mL chemo infusion, 500 mg/m2 = 1,100 mg, Intravenous,  Once, 4 of 9 cycles Administration: 1,100 mg (12/11/2019), 1,100 mg (01/22/2020), 1,100 mg (02/12/2020), 1,100 mg (01/01/2020)  for chemotherapy treatment.       INTERVAL HISTORY:  Mitchell Mosley 59 y.o.  male pleasant patient above history of metastatic adenocarcinoma the lung-NED currently on maintenance Alimta is here for follow-up.  Denies any nausea vomiting denies any headaches.  Denies any shortness of breath or cough.  Appetite is good.  Mild swelling of the legs and also hands.  Review of Systems  Constitutional: Negative for chills, diaphoresis, fever, malaise/fatigue and weight loss.  HENT: Negative for nosebleeds and sore throat.   Eyes: Negative for double vision.  Respiratory: Negative for hemoptysis, shortness of breath and wheezing.    Cardiovascular: Positive for leg swelling. Negative for chest pain, palpitations and orthopnea.  Gastrointestinal: Negative for abdominal pain, blood in stool, constipation, diarrhea, heartburn, melena, nausea and vomiting.  Genitourinary: Negative for dysuria, frequency and urgency.  Musculoskeletal: Positive for joint pain.  Skin: Negative.  Negative for itching and rash.  Neurological: Negative for dizziness, tingling, focal weakness, weakness and headaches.  Endo/Heme/Allergies: Does not bruise/bleed easily.  Psychiatric/Behavioral: Negative for depression. The patient is not nervous/anxious and does not have insomnia.       PAST MEDICAL HISTORY :  Past Medical History:  Diagnosis Date  . Allergy   . Lung cancer ([place])   . Mild valvular heart disease Nov. 2014   per 2 D Echocardiogram done for persistent leg edema, monitored by cardiology  . Peripheral edema     PAST SURGICAL HISTORY :   Past Surgical History:  Procedure Laterality Date  . ELBOW SURGERY Right 2011  . Left anterior thoracotomy with biopsy  March 2012  . PORTACATH PLACEMENT  March 2012  . TONSILLECTOMY  as child    FAMILY HISTORY :   Family History  Problem Relation Age of Onset  . Leukemia Father   . Diabetes Mother     SOCIAL HISTORY:   Social History   Tobacco Use  . Smoking status: Never Smoker  . Smokeless tobacco: Never Used  Substance Use Topics  . Alcohol use: Yes    Alcohol/week: 0.0 standard drinks    Comment: "social drinker"   . Drug use: No    ALLERGIES:  is allergic to penicillins.  MEDICATIONS:  Current Outpatient Medications  Medication  Sig Dispense Refill  . furosemide (LASIX) 20 MG tablet TAKE 1 TABLET (20 MG TOTAL) BY MOUTH DAILY AS NEEDED FOR SWELLING 90 tablet 1  . folic acid (FOLVITE) 1 MG tablet Take 1 tablet (1 mg total) by mouth daily. 90 tablet 3  . loratadine (CLARITIN) 10 MG tablet Take 1 tablet (10 mg total) by mouth daily.    . meloxicam (MOBIC) 7.5 MG tablet  Take 7.5 mg by mouth daily.     . Multiple Vitamin (MULTIVITAMIN) capsule Take 1 capsule by mouth daily. Reported on 02/24/2016    . Omega-3 Fatty Acids (FISH OIL) 1000 MG CAPS Take 1 capsule by mouth daily. Reported on 02/24/2016    . senna (SENOKOT) 8.6 MG tablet Take 1 tablet by mouth daily. Take as needed around time of chemotherapy    . spironolactone (ALDACTONE) 25 MG tablet TAKE 1 TABLET BY MOUTH EVERY DAY 90 tablet 3  . tadalafil (CIALIS) 20 MG tablet TAKE 1 TABLET BY MOUTH EVERY 2 DAYS AS NEEDED. 6 tablet 1  . vitamin B-12 (CYANOCOBALAMIN) 1000 MCG tablet Take 1,000 mcg by mouth daily.     No current facility-administered medications for this visit.   Facility-Administered Medications Ordered in Other Visits  Medication Dose Route Frequency Provider Last Rate Last Admin  . sodium chloride 0.9 % injection 10 mL  10 mL Intracatheter PRN [name], MD   10 mL at 05/27/15 1543  . sodium chloride 0.9 % injection 10 mL  10 mL Intracatheter PRN [name], MD   10 mL at 06/17/15 1510  . sodium chloride 0.9 % injection 10 mL  10 mL Intravenous PRN [name], MD   10 mL at 07/08/15 1340    PHYSICAL EXAMINATION: ECOG PERFORMANCE STATUS: 0 - Asymptomatic  BP 136/85 (BP Location: Left Arm, Patient Position: Sitting, Cuff Size: Normal)   Pulse 95   Temp (!) 96.9 F (36.1 C) (Tympanic)   Wt 218 lb 9.6 oz (99.2 kg)   SpO2 100%   BMI 31.37 kg/m   Filed Weights   02/12/20 1321  Weight: 218 lb 9.6 oz (99.2 kg)    Physical Exam  Constitutional: He is oriented to person, place, and time and well-developed, well-nourished, and in no distress.  HENT:  Head: Normocephalic and atraumatic.  Mouth/Throat: Oropharynx is clear and moist. No oropharyngeal exudate.  Eyes: Pupils are equal, round, and reactive to light.  Cardiovascular: Normal rate and regular rhythm.  Pulmonary/Chest: No respiratory distress. He has no wheezes.  Abdominal: Soft. Bowel sounds are normal. He exhibits  no distension and no mass. There is no abdominal tenderness. There is no rebound and no guarding.  Musculoskeletal:        General: Edema present. No tenderness. Normal range of motion.     Cervical back: Normal range of motion and neck supple.     Comments: Grade 1 swelling in the legs bilaterally.  Neurological: He is alert and oriented to person, place, and time.  Skin: Skin is warm.  Psychiatric: Affect normal.     LABORATORY DATA:  I have reviewed the data as listed    Component Value Date/Time   NA 134 (L) 02/12/2020 1307   NA 138 12/10/2014 1341   K 3.7 02/12/2020 1307   K 3.6 12/10/2014 1341   CL 100 02/12/2020 1307   CL 102 12/10/2014 1341   CO2 24 02/12/2020 1307   CO2 30 12/10/2014 1341   GLUCOSE 192 (H) 02/12/2020 1307   GLUCOSE 160 (  H) 12/10/2014 1341   BUN 24 (H) 02/12/2020 1307   BUN 21 (H) 12/10/2014 1341   CREATININE 0.99 02/12/2020 1307   CREATININE 1.10 03/25/2015 1453   CALCIUM 8.8 (L) 02/12/2020 1307   CALCIUM 8.4 (L) 12/10/2014 1341   PROT 7.2 02/12/2020 1307   PROT 6.6 12/10/2014 1341   ALBUMIN 3.9 02/12/2020 1307   ALBUMIN 3.4 12/10/2014 1341   AST 21 02/12/2020 1307   AST 17 12/10/2014 1341   ALT 15 02/12/2020 1307   ALT 23 12/10/2014 1341   ALKPHOS 41 02/12/2020 1307   ALKPHOS 50 12/10/2014 1341   BILITOT 0.9 02/12/2020 1307   BILITOT 0.5 12/10/2014 1341   GFRNONAA >60 02/12/2020 1307   GFRNONAA >60 03/25/2015 1453   GFRAA >60 02/12/2020 1307   GFRAA >60 03/25/2015 1453    No results found for: SPEP, UPEP  Lab Results  Component Value Date   WBC 5.2 02/12/2020   NEUTROABS 2.9 02/12/2020   HGB 13.7 02/12/2020   HCT 40.6 02/12/2020   MCV 91.9 02/12/2020   PLT 155 02/12/2020      Chemistry      Component Value Date/Time   NA 134 (L) 02/12/2020 1307   NA 138 12/10/2014 1341   K 3.7 02/12/2020 1307   K 3.6 12/10/2014 1341   CL 100 02/12/2020 1307   CL 102 12/10/2014 1341   CO2 24 02/12/2020 1307   CO2 30 12/10/2014 1341    BUN 24 (H) 02/12/2020 1307   BUN 21 (H) 12/10/2014 1341   CREATININE 0.99 02/12/2020 1307   CREATININE 1.10 03/25/2015 1453      Component Value Date/Time   CALCIUM 8.8 (L) 02/12/2020 1307   CALCIUM 8.4 (L) 12/10/2014 1341   ALKPHOS 41 02/12/2020 1307   ALKPHOS 50 12/10/2014 1341   AST 21 02/12/2020 1307   AST 17 12/10/2014 1341   ALT 15 02/12/2020 1307   ALT 23 12/10/2014 1341   BILITOT 0.9 02/12/2020 1307   BILITOT 0.5 12/10/2014 1341       RADIOGRAPHIC STUDIES: I have personally reviewed the radiological images as listed and agreed with the findings in the report. No results found.   ASSESSMENT & PLAN:  Cancer of hilus of left lung ([place]) # Adenocarcinoma the lung; stage IV; NOV 30th 2020-CT scan chest/a/p-shows no evidence of metastatic disease; STABLE.   # Proceed with Alimta chemotherapy. Labs today reviewed;  acceptable for treatment today. Will do scan in may 2021.   # swelling in the hands/feet-grade 1 asymptomatic.? Alimta; monitor.   # DISPOSITION:  # Alimta today # Follow up in 3 weeks MD-labs- cbc/cmp; alimta- Dr.B   No orders of the defined types were placed in this encounter.  All questions were answered. The patient knows to call the clinic with any problems, questions or concerns.      [name], MD 02/15/2020 12:43 PM

## 2020-02-12 NOTE — Assessment & Plan Note (Signed)
#   Adenocarcinoma the lung; stage IV; NOV 30th 2020-CT scan chest/a/p-shows no evidence of metastatic disease; STABLE.   # Proceed with Alimta chemotherapy. Labs today reviewed;  acceptable for treatment today. Will do scan in may 2021.   # swelling in the hands/feet-grade 1 asymptomatic.? Alimta; monitor.   # DISPOSITION:  # Alimta today # Follow up in 3 weeks MD-labs- cbc/cmp; alimta- Dr.B

## 2020-02-25 NOTE — Progress Notes (Signed)

## 2020-03-02 ENCOUNTER — Other Ambulatory Visit: Payer: Self-pay | Admitting: Internal Medicine

## 2020-03-02 DIAGNOSIS — C3402 Malignant neoplasm of left main bronchus: Secondary | ICD-10-CM

## 2020-03-04 ENCOUNTER — Inpatient Hospital Stay: Payer: BC Managed Care – PPO | Attending: Internal Medicine

## 2020-03-04 ENCOUNTER — Inpatient Hospital Stay (HOSPITAL_BASED_OUTPATIENT_CLINIC_OR_DEPARTMENT_OTHER): Payer: BC Managed Care – PPO | Admitting: Internal Medicine

## 2020-03-04 ENCOUNTER — Other Ambulatory Visit: Payer: Self-pay

## 2020-03-04 ENCOUNTER — Encounter: Payer: Self-pay | Admitting: Internal Medicine

## 2020-03-04 ENCOUNTER — Inpatient Hospital Stay: Payer: BC Managed Care – PPO

## 2020-03-04 DIAGNOSIS — L299 Pruritus, unspecified: Secondary | ICD-10-CM | POA: Diagnosis not present

## 2020-03-04 DIAGNOSIS — C3402 Malignant neoplasm of left main bronchus: Secondary | ICD-10-CM

## 2020-03-04 DIAGNOSIS — Z5111 Encounter for antineoplastic chemotherapy: Secondary | ICD-10-CM | POA: Insufficient documentation

## 2020-03-04 DIAGNOSIS — M7989 Other specified soft tissue disorders: Secondary | ICD-10-CM | POA: Insufficient documentation

## 2020-03-04 DIAGNOSIS — Z452 Encounter for adjustment and management of vascular access device: Secondary | ICD-10-CM | POA: Diagnosis not present

## 2020-03-04 DIAGNOSIS — Z95828 Presence of other vascular implants and grafts: Secondary | ICD-10-CM

## 2020-03-04 LAB — COMPREHENSIVE METABOLIC PANEL
ALT: 20 U/L (ref 0–44)
AST: 24 U/L (ref 15–41)
Albumin: 4.1 g/dL (ref 3.5–5.0)
Alkaline Phosphatase: 47 U/L (ref 38–126)
Anion gap: 10 (ref 5–15)
BUN: 18 mg/dL (ref 6–20)
CO2: 26 mmol/L (ref 22–32)
Calcium: 8.8 mg/dL — ABNORMAL LOW (ref 8.9–10.3)
Chloride: 98 mmol/L (ref 98–111)
Creatinine, Ser: 1.25 mg/dL — ABNORMAL HIGH (ref 0.61–1.24)
GFR calc Af Amer: >60 mL/min (ref >60)
GFR calc non Af Amer: >60 mL/min (ref >60)
Glucose, Bld: 187 mg/dL — ABNORMAL HIGH (ref 70–99)
Potassium: 3.7 mmol/L (ref 3.5–5.1)
Sodium: 134 mmol/L — ABNORMAL LOW (ref 135–145)
Total Bilirubin: 0.7 mg/dL (ref 0.3–1.2)
Total Protein: 7.5 g/dL (ref 6.5–8.1)

## 2020-03-04 LAB — CBC WITH DIFFERENTIAL/PLATELET
Abs Immature Granulocytes: 0.02 10*3/uL (ref 0.00–0.07)
Basophils Absolute: 0.1 10*3/uL (ref 0.0–0.1)
Basophils Relative: 1 %
Eosinophils Absolute: 0.2 10*3/uL (ref 0.0–0.5)
Eosinophils Relative: 3 %
HCT: 40.3 % (ref 39.0–52.0)
Hemoglobin: 14.1 g/dL (ref 13.0–17.0)
Immature Granulocytes: 0 %
Lymphocytes Relative: 28 %
Lymphs Abs: 1.6 10*3/uL (ref 0.7–4.0)
MCH: 31.9 pg (ref 26.0–34.0)
MCHC: 35 g/dL (ref 30.0–36.0)
MCV: 91.2 fL (ref 80.0–100.0)
Monocytes Absolute: 0.5 10*3/uL (ref 0.1–1.0)
Monocytes Relative: 9 %
Neutro Abs: 3.3 10*3/uL (ref 1.7–7.7)
Neutrophils Relative %: 59 %
Platelets: 169 10*3/uL (ref 150–400)
RBC: 4.42 MIL/uL (ref 4.22–5.81)
RDW: 13.2 % (ref 11.5–15.5)
WBC: 5.6 10*3/uL (ref 4.0–10.5)
nRBC: 0 % (ref 0.0–0.2)

## 2020-03-04 MED ORDER — SODIUM CHLORIDE 0.9 % IV SOLN
Freq: Once | INTRAVENOUS | Status: AC
  Filled 2020-03-04: qty 250

## 2020-03-04 MED ORDER — SODIUM CHLORIDE 0.9% FLUSH
10.0000 mL | Freq: Once | INTRAVENOUS | Status: AC
  Administered 2020-03-04: 13:00:00 10 mL via INTRAVENOUS
  Filled 2020-03-04: qty 10

## 2020-03-04 MED ORDER — PROCHLORPERAZINE MALEATE 10 MG PO TABS
10.0000 mg | ORAL_TABLET | Freq: Once | ORAL | Status: AC
  Administered 2020-03-04: 10 mg via ORAL
  Filled 2020-03-04: qty 1

## 2020-03-04 MED ORDER — HEPARIN SOD (PORK) LOCK FLUSH 100 UNIT/ML IV SOLN
INTRAVENOUS | Status: AC
  Filled 2020-03-04: qty 5

## 2020-03-04 MED ORDER — SODIUM CHLORIDE 0.9 % IV SOLN
500.0000 mg/m2 | Freq: Once | INTRAVENOUS | Status: AC
  Administered 2020-03-04: 1100 mg via INTRAVENOUS
  Filled 2020-03-04: qty 40

## 2020-03-04 MED ORDER — HEPARIN SOD (PORK) LOCK FLUSH 100 UNIT/ML IV SOLN
500.0000 [IU] | Freq: Once | INTRAVENOUS | Status: AC | PRN
  Administered 2020-03-04: 500 [IU]
  Filled 2020-03-04: qty 5

## 2020-03-04 MED ORDER — DEXAMETHASONE SODIUM PHOSPHATE 10 MG/ML IJ SOLN
6.0000 mg | Freq: Once | INTRAMUSCULAR | Status: AC
  Administered 2020-03-04: 6 mg via INTRAVENOUS
  Filled 2020-03-04: qty 1

## 2020-03-04 NOTE — Assessment & Plan Note (Addendum)
#   Adenocarcinoma the lung; stage IV; NOV 30th 2020-CT scan chest/a/p-shows no evidence of metastatic disease; stable.  # Proceed with Alimta chemotherapy. Labs today reviewed;  acceptable for treatment today. Will order scan at next visit.   # swelling in the hands/feet-grade 1 asymptomatic.? Alimta; monitor. STABLE.   # DISPOSITION:  # Alimta today # Follow up in 3 weeks MD-labs- cbc/cmp; alimta- Dr.B

## 2020-03-04 NOTE — Progress Notes (Signed)
[place] OFFICE PROGRESS NOTE  Patient Care Team: [name], MD as PCP - General [facility] Medicine)  Cancer Staging No matching staging information was found for the patient.   Oncology History Overview Note  # 2012- METASTATIC ADENO CA of LEFT LUNG [acinar pattern] s/p MED LN Bx [NEG- EGFR/K-ras/ALK mutation];[name]; [name]; CT JULY  2016- NED; JAN 2018- NED;   # AUG 2020-clinical trial closed; continue maintenance Alimta  # Thoracic aortic aneurysm-4.0 cm in 2016; currently 4.6 cm.  s/p referral to Dr. Faith Rogue.; MRI in 6 months planned.  ----------------------------------------------------------    DIAGNOSIS: [name]  STAGE: 4       ;GOALS: Palliative  CURRENT/MOST RECENT THERAPY '[ ]'  Alimta maintenance    Cancer of hilus of left lung ([place])  07/20/2016 Initial Diagnosis   Cancer of hilus of left lung ([place])   12/11/2019 -  Chemotherapy   The patient had PEMEtrexed (ALIMTA) 1,100 mg in sodium chloride 0.9 % 100 mL chemo infusion, 500 mg/m2 = 1,100 mg, Intravenous,  Once, 5 of 9 cycles Administration: 1,100 mg (12/11/2019), 1,100 mg (01/22/2020), 1,100 mg (02/12/2020), 1,100 mg (01/01/2020), 1,100 mg (03/04/2020)  for chemotherapy treatment.       INTERVAL HISTORY:  Mitchell Mosley 59 y.o.  male pleasant patient above history of metastatic adenocarcinoma the lung-NED currently on maintenance Alimta is here for follow-up.  Denies any shortness of breath or cough.  Appetite is good with no weight loss.  Mild swelling in the legs.  Mild arthritis not any worse.  Review of Systems  Constitutional: Negative for chills, diaphoresis, fever, malaise/fatigue and weight loss.  HENT: Negative for nosebleeds and sore throat.   Eyes: Negative for double vision.  Respiratory: Negative for hemoptysis, shortness of breath and wheezing.    Cardiovascular: Positive for leg swelling. Negative for chest pain, palpitations and orthopnea.  Gastrointestinal: Negative for abdominal pain, blood in stool, constipation, diarrhea, heartburn, melena, nausea and vomiting.  Genitourinary: Negative for dysuria, frequency and urgency.  Musculoskeletal: Positive for joint pain.  Skin: Negative.  Negative for itching and rash.  Neurological: Negative for dizziness, tingling, focal weakness, weakness and headaches.  Endo/Heme/Allergies: Does not bruise/bleed easily.  Psychiatric/Behavioral: Negative for depression. The patient is not nervous/anxious and does not have insomnia.       PAST MEDICAL HISTORY :  Past Medical History:  Diagnosis Date  . Allergy   . Lung cancer ([place])   . Mild valvular heart disease Nov. 2014   per 2 D Echocardiogram done for persistent leg edema, monitored by cardiology  . Peripheral edema     PAST SURGICAL HISTORY :   Past Surgical History:  Procedure Laterality Date  . ELBOW SURGERY Right 2011  . Left anterior thoracotomy with biopsy  March 2012  . PORTACATH PLACEMENT  March 2012  . TONSILLECTOMY  as child    FAMILY HISTORY :   Family History  Problem Relation Age of Onset  . Leukemia Father   . Diabetes Mother     SOCIAL HISTORY:   Social History   Tobacco Use  . Smoking status: Never Smoker  . Smokeless tobacco: Never Used  Substance Use Topics  . Alcohol use: Yes    Alcohol/week: 0.0 standard drinks    Comment: "social drinker"   . Drug use: No    ALLERGIES:  is allergic to penicillins.  MEDICATIONS:  Current Outpatient Medications  Medication Sig Dispense Refill  . folic acid (FOLVITE) 1 MG tablet TAKE 1 TABLET BY MOUTH EVERY DAY 90 tablet 3  . furosemide (LASIX) 20 MG tablet TAKE 1 TABLET (20 MG TOTAL) BY MOUTH DAILY AS NEEDED FOR SWELLING 90 tablet 1  . loratadine (CLARITIN) 10 MG tablet Take 1 tablet (10 mg total) by mouth daily.    . meloxicam (MOBIC) 7.5 MG tablet Take 7.5  mg by mouth daily.     . Multiple Vitamin (MULTIVITAMIN) capsule Take 1 capsule by mouth daily. Reported on 02/24/2016    . Omega-3 Fatty Acids (FISH OIL) 1000 MG CAPS Take 1 capsule by mouth daily. Reported on 02/24/2016    . senna (SENOKOT) 8.6 MG tablet Take 1 tablet by mouth daily. Take as needed around time of chemotherapy    . spironolactone (ALDACTONE) 25 MG tablet TAKE 1 TABLET BY MOUTH EVERY DAY 90 tablet 3  . tadalafil (CIALIS) 20 MG tablet TAKE 1 TABLET BY MOUTH EVERY 2 DAYS AS NEEDED. 6 tablet 1  . vitamin B-12 (CYANOCOBALAMIN) 1000 MCG tablet Take 1,000 mcg by mouth daily.     No current facility-administered medications for this visit.   Facility-Administered Medications Ordered in Other Visits  Medication Dose Route Frequency Provider Last Rate Last Admin  . sodium chloride 0.9 % injection 10 mL  10 mL Intracatheter PRN [name], MD   10 mL at 05/27/15 1543  . sodium chloride 0.9 % injection 10 mL  10 mL Intracatheter PRN [name], MD   10 mL at 06/17/15 1510  . sodium chloride 0.9 % injection 10 mL  10 mL Intravenous PRN [name], MD   10 mL at 07/08/15 1340    PHYSICAL EXAMINATION: ECOG PERFORMANCE STATUS: 0 - Asymptomatic  BP 116/83   Pulse 89   Temp (!) 96.2 F (35.7 C) (Tympanic)   Resp 20   Ht '5\' 10"'  (1.778 m)   Wt 218 lb (98.9 kg)   BMI 31.28 kg/m   Filed Weights   03/04/20 1348  Weight: 218 lb (98.9 kg)    Physical Exam  Constitutional: He is oriented to person, place, and time and well-developed, well-nourished, and in no distress.  HENT:  Head: Normocephalic and atraumatic.  Mouth/Throat: Oropharynx is clear and moist. No oropharyngeal exudate.  Eyes: Pupils are equal, round, and reactive to light.  Cardiovascular: Normal rate and regular rhythm.  Pulmonary/Chest: No respiratory distress. He has no wheezes.  Abdominal: Soft. Bowel sounds are normal. He exhibits no distension and no mass. There is no abdominal tenderness. There is no  rebound and no guarding.  Musculoskeletal:        General: Edema present. No tenderness. Normal range of motion.     Cervical back: Normal range of motion and neck supple.     Comments: Grade 1 swelling in the legs bilaterally.  Neurological: He is alert and oriented to person, place, and time.  Skin: Skin is warm.  Psychiatric: Affect normal.     LABORATORY DATA:  I have reviewed the data as listed    Component Value Date/Time   NA 134 (L) 03/04/2020 1321   NA 138 12/10/2014 1341   K 3.7 03/04/2020 1321   K 3.6 12/10/2014 1341   CL 98 03/04/2020 1321   CL 102 12/10/2014 1341   CO2 26 03/04/2020 1321   CO2 30 12/10/2014 1341   GLUCOSE 187 (H) 03/04/2020 1321   GLUCOSE 160 (H) 12/10/2014 1341   BUN 18 03/04/2020  1321   BUN 21 (H) 12/10/2014 1341   CREATININE 1.25 (H) 03/04/2020 1321   CREATININE 1.10 03/25/2015 1453   CALCIUM 8.8 (L) 03/04/2020 1321   CALCIUM 8.4 (L) 12/10/2014 1341   PROT 7.5 03/04/2020 1321   PROT 6.6 12/10/2014 1341   ALBUMIN 4.1 03/04/2020 1321   ALBUMIN 3.4 12/10/2014 1341   AST 24 03/04/2020 1321   AST 17 12/10/2014 1341   ALT 20 03/04/2020 1321   ALT 23 12/10/2014 1341   ALKPHOS 47 03/04/2020 1321   ALKPHOS 50 12/10/2014 1341   BILITOT 0.7 03/04/2020 1321   BILITOT 0.5 12/10/2014 1341   GFRNONAA >60 03/04/2020 1321   GFRNONAA >60 03/25/2015 1453   GFRAA >60 03/04/2020 1321   GFRAA >60 03/25/2015 1453    No results found for: SPEP, UPEP  Lab Results  Component Value Date   WBC 5.6 03/04/2020   NEUTROABS 3.3 03/04/2020   HGB 14.1 03/04/2020   HCT 40.3 03/04/2020   MCV 91.2 03/04/2020   PLT 169 03/04/2020      Chemistry      Component Value Date/Time   NA 134 (L) 03/04/2020 1321   NA 138 12/10/2014 1341   K 3.7 03/04/2020 1321   K 3.6 12/10/2014 1341   CL 98 03/04/2020 1321   CL 102 12/10/2014 1341   CO2 26 03/04/2020 1321   CO2 30 12/10/2014 1341   BUN 18 03/04/2020 1321   BUN 21 (H) 12/10/2014 1341   CREATININE 1.25 (H)  03/04/2020 1321   CREATININE 1.10 03/25/2015 1453      Component Value Date/Time   CALCIUM 8.8 (L) 03/04/2020 1321   CALCIUM 8.4 (L) 12/10/2014 1341   ALKPHOS 47 03/04/2020 1321   ALKPHOS 50 12/10/2014 1341   AST 24 03/04/2020 1321   AST 17 12/10/2014 1341   ALT 20 03/04/2020 1321   ALT 23 12/10/2014 1341   BILITOT 0.7 03/04/2020 1321   BILITOT 0.5 12/10/2014 1341       RADIOGRAPHIC STUDIES: I have personally reviewed the radiological images as listed and agreed with the findings in the report. No results found.   ASSESSMENT & PLAN:  Cancer of hilus of left lung ([place]) # Adenocarcinoma the lung; stage IV; NOV 30th 2020-CT scan chest/a/p-shows no evidence of metastatic disease; stable.  # Proceed with Alimta chemotherapy. Labs today reviewed;  acceptable for treatment today. Will order scan at next visit.   # swelling in the hands/feet-grade 1 asymptomatic.? Alimta; monitor. STABLE.   # DISPOSITION:  # Alimta today # Follow up in 3 weeks MD-labs- cbc/cmp; alimta- Dr.B   No orders of the defined types were placed in this encounter.  All questions were answered. The patient knows to call the clinic with any problems, questions or concerns.      [name], MD 03/09/2020 8:20 AM

## 2020-03-17 NOTE — Progress Notes (Signed)

## 2020-03-25 ENCOUNTER — Inpatient Hospital Stay (HOSPITAL_BASED_OUTPATIENT_CLINIC_OR_DEPARTMENT_OTHER): Payer: BC Managed Care – PPO | Admitting: Internal Medicine

## 2020-03-25 ENCOUNTER — Ambulatory Visit
Admission: RE | Admit: 2020-03-25 | Discharge: 2020-03-25 | Disposition: A | Discharge status: Home / Self Care | Payer: BC Managed Care – PPO | Source: Ambulatory Visit | Attending: Internal Medicine | Admitting: Internal Medicine

## 2020-03-25 ENCOUNTER — Other Ambulatory Visit: Payer: Self-pay

## 2020-03-25 ENCOUNTER — Inpatient Hospital Stay: Payer: BC Managed Care – PPO

## 2020-03-25 DIAGNOSIS — Z95828 Presence of other vascular implants and grafts: Secondary | ICD-10-CM | POA: Diagnosis not present

## 2020-03-25 DIAGNOSIS — Z5111 Encounter for antineoplastic chemotherapy: Secondary | ICD-10-CM | POA: Diagnosis not present

## 2020-03-25 DIAGNOSIS — Z452 Encounter for adjustment and management of vascular access device: Secondary | ICD-10-CM | POA: Diagnosis not present

## 2020-03-25 DIAGNOSIS — M7989 Other specified soft tissue disorders: Secondary | ICD-10-CM | POA: Diagnosis not present

## 2020-03-25 DIAGNOSIS — C3402 Malignant neoplasm of left main bronchus: Secondary | ICD-10-CM | POA: Diagnosis not present

## 2020-03-25 DIAGNOSIS — L299 Pruritus, unspecified: Secondary | ICD-10-CM | POA: Diagnosis not present

## 2020-03-25 DIAGNOSIS — T82828A Fibrosis of vascular prosthetic devices, implants and grafts, initial encounter: Secondary | ICD-10-CM | POA: Diagnosis not present

## 2020-03-25 LAB — COMPREHENSIVE METABOLIC PANEL
ALT: 19 U/L (ref 0–44)
AST: 23 U/L (ref 15–41)
Albumin: 3.9 g/dL (ref 3.5–5.0)
Alkaline Phosphatase: 43 U/L (ref 38–126)
Anion gap: 11 (ref 5–15)
BUN: 23 mg/dL — ABNORMAL HIGH (ref 6–20)
CO2: 26 mmol/L (ref 22–32)
Calcium: 8.5 mg/dL — ABNORMAL LOW (ref 8.9–10.3)
Chloride: 97 mmol/L — ABNORMAL LOW (ref 98–111)
Creatinine, Ser: 1.03 mg/dL (ref 0.61–1.24)
GFR calc Af Amer: >60 mL/min (ref >60)
GFR calc non Af Amer: >60 mL/min (ref >60)
Glucose, Bld: 131 mg/dL — ABNORMAL HIGH (ref 70–99)
Potassium: 3.5 mmol/L (ref 3.5–5.1)
Sodium: 134 mmol/L — ABNORMAL LOW (ref 135–145)
Total Bilirubin: 0.9 mg/dL (ref 0.3–1.2)
Total Protein: 7 g/dL (ref 6.5–8.1)

## 2020-03-25 LAB — CBC WITH DIFFERENTIAL/PLATELET
Abs Immature Granulocytes: 0.03 10*3/uL (ref 0.00–0.07)
Basophils Absolute: 0 10*3/uL (ref 0.0–0.1)
Basophils Relative: 1 %
Eosinophils Absolute: 0.2 10*3/uL (ref 0.0–0.5)
Eosinophils Relative: 4 %
HCT: 39.6 % (ref 39.0–52.0)
Hemoglobin: 13.7 g/dL (ref 13.0–17.0)
Immature Granulocytes: 1 %
Lymphocytes Relative: 27 %
Lymphs Abs: 1.5 10*3/uL (ref 0.7–4.0)
MCH: 32.4 pg (ref 26.0–34.0)
MCHC: 34.6 g/dL (ref 30.0–36.0)
MCV: 93.6 fL (ref 80.0–100.0)
Monocytes Absolute: 0.6 10*3/uL (ref 0.1–1.0)
Monocytes Relative: 10 %
Neutro Abs: 3.3 10*3/uL (ref 1.7–7.7)
Neutrophils Relative %: 57 %
Platelets: 170 10*3/uL (ref 150–400)
RBC: 4.23 MIL/uL (ref 4.22–5.81)
RDW: 13.2 % (ref 11.5–15.5)
WBC: 5.7 10*3/uL (ref 4.0–10.5)
nRBC: 0 % (ref 0.0–0.2)

## 2020-03-25 MED ORDER — DEXAMETHASONE SODIUM PHOSPHATE 10 MG/ML IJ SOLN
6.0000 mg | Freq: Once | INTRAMUSCULAR | Status: AC
  Administered 2020-03-25: 6 mg via INTRAVENOUS
  Filled 2020-03-25: qty 1

## 2020-03-25 MED ORDER — PROCHLORPERAZINE MALEATE 10 MG PO TABS
10.0000 mg | ORAL_TABLET | Freq: Once | ORAL | Status: AC
  Administered 2020-03-25: 10 mg via ORAL
  Filled 2020-03-25: qty 1

## 2020-03-25 MED ORDER — CYANOCOBALAMIN 1000 MCG/ML IJ SOLN
1000.0000 ug | Freq: Once | INTRAMUSCULAR | Status: AC
  Administered 2020-03-25: 1000 ug via INTRAMUSCULAR
  Filled 2020-03-25: qty 1

## 2020-03-25 MED ORDER — SODIUM CHLORIDE 0.9 % IV SOLN
500.0000 mg/m2 | Freq: Once | INTRAVENOUS | Status: AC
  Administered 2020-03-25: 1100 mg via INTRAVENOUS
  Filled 2020-03-25: qty 40

## 2020-03-25 MED ORDER — HEPARIN SOD (PORK) LOCK FLUSH 100 UNIT/ML IV SOLN
INTRAVENOUS | Status: AC
  Filled 2020-03-25: qty 5

## 2020-03-25 MED ORDER — SODIUM CHLORIDE 0.9 % IV SOLN
Freq: Once | INTRAVENOUS | Status: AC
  Filled 2020-03-25: qty 250

## 2020-03-25 MED ORDER — SODIUM CHLORIDE 0.9% FLUSH
10.0000 mL | Freq: Once | INTRAVENOUS | Status: AC
  Administered 2020-03-25: 10 mL via INTRAVENOUS
  Filled 2020-03-25: qty 10

## 2020-03-25 MED ORDER — IOHEXOL 300 MG/ML  SOLN
10.0000 mL | Freq: Once | INTRAMUSCULAR | Status: AC | PRN
  Administered 2020-03-25: 16:00:00 10 mL

## 2020-03-25 MED ORDER — HEPARIN SOD (PORK) LOCK FLUSH 100 UNIT/ML IV SOLN
500.0000 [IU] | Freq: Once | INTRAVENOUS | Status: DC | PRN
  Filled 2020-03-25: qty 5

## 2020-03-25 NOTE — Assessment & Plan Note (Addendum)
#   Adenocarcinoma the lung; stage IV; NOV 30th 2020-CT scan chest/a/p-shows no evidence of metastatic disease; stable  # Proceed with Alimta chemotherapy. Labs today reviewed;  acceptable for treatment today. Will order scans at next visit.   # swelling in the hands/feet-grade 1 asymptomatic.? Alimta; monitor. STABLE.   #Port access malfunction-recommend dye study.  # Itchy palms/soles-question Alimta.  Grade 1.  Continue antihistamines.  # DISPOSITION:  # Alimta today # dye test asap # Follow up in 3 weeks MD-labs- cbc/cmp; alimta- Dr.B  Addendum: Dye study-fibrin sheath present.  Recommend TPA.

## 2020-03-25 NOTE — Progress Notes (Signed)
Pt in for follow up, reports itching on palms of hands and soles of feet.  Pt has large brown spot on left upper arm, states was red a few weeks ago. Pt is concerned about continued difficulty with blood return from port.  States would like to do the cathflo, to see if improves blood return.

## 2020-03-25 NOTE — Progress Notes (Signed)
[place] OFFICE PROGRESS NOTE  Patient Care Team: [name], MD as PCP - General [facility] Medicine)  Cancer Staging No matching staging information was found for the patient.   Oncology History Overview Note  # 2012- METASTATIC ADENO CA of LEFT LUNG [acinar pattern] s/p MED LN Bx [NEG- EGFR/K-ras/ALK mutation];[name]; [name]; CT JULY  2016- NED; JAN 2018- NED;   # AUG 2020-clinical trial closed; continue maintenance Alimta  # Thoracic aortic aneurysm-4.0 cm in 2016; currently 4.6 cm.  s/p referral to Dr. Faith Rogue.; MRI in 6 months planned.  ----------------------------------------------------------    DIAGNOSIS: [name]  STAGE: 4       ;GOALS: Palliative  CURRENT/MOST RECENT THERAPY '[ ]'  Alimta maintenance    Cancer of hilus of left lung ([place])  07/20/2016 Initial Diagnosis   Cancer of hilus of left lung ([place])   12/11/2019 -  Chemotherapy   The patient had PEMEtrexed (ALIMTA) 1,100 mg in sodium chloride 0.9 % 100 mL chemo infusion, 500 mg/m2 = 1,100 mg, Intravenous,  Once, 6 of 9 cycles Administration: 1,100 mg (12/11/2019), 1,100 mg (01/22/2020), 1,100 mg (02/12/2020), 1,100 mg (03/25/2020), 1,100 mg (01/01/2020), 1,100 mg (03/04/2020)  for chemotherapy treatment.       INTERVAL HISTORY:  Mitchell Mosley 59 y.o.  male pleasant patient above history of metastatic adenocarcinoma the lung-NED currently on maintenance Alimta is here for follow-up.  Patient denies any unusual shortness of breath or cough.  Complains of itchy palms and soles.  Denies any obvious skin rash.  No headaches.  No nausea no vomiting.  Chronic mild arthritis not any worse.  Review of Systems  Constitutional: Negative for chills, diaphoresis, fever, malaise/fatigue and weight loss.  HENT: Negative for nosebleeds and sore throat.   Eyes: Negative for double  vision.  Respiratory: Negative for hemoptysis, shortness of breath and wheezing.   Cardiovascular: Positive for leg swelling. Negative for chest pain, palpitations and orthopnea.  Gastrointestinal: Negative for abdominal pain, blood in stool, constipation, diarrhea, heartburn, melena, nausea and vomiting.  Genitourinary: Negative for dysuria, frequency and urgency.  Musculoskeletal: Positive for joint pain.  Skin: Negative.  Negative for itching and rash.  Neurological: Negative for dizziness, tingling, focal weakness, weakness and headaches.  Endo/Heme/Allergies: Does not bruise/bleed easily.  Psychiatric/Behavioral: Negative for depression. The patient is not nervous/anxious and does not have insomnia.       PAST MEDICAL HISTORY :  Past Medical History:  Diagnosis Date  . Allergy   . Lung cancer ([place])   . Mild valvular heart disease Nov. 2014   per 2 D Echocardiogram done for persistent leg edema, monitored by cardiology  . Peripheral edema     PAST SURGICAL HISTORY :   Past Surgical History:  Procedure Laterality Date  . ELBOW SURGERY Right 2011  . Left anterior thoracotomy with biopsy  March 2012  . PORTACATH PLACEMENT  March 2012  . TONSILLECTOMY  as child    FAMILY HISTORY :   Family History  Problem Relation Age of Onset  . Leukemia Father   . Diabetes Mother     SOCIAL HISTORY:   Social History   Tobacco Use  . Smoking status: Never Smoker  . Smokeless tobacco: Never Used  Substance Use Topics  . Alcohol use: Yes    Alcohol/week: 0.0 standard drinks    Comment: "social drinker"   . Drug use: No  ALLERGIES:  is allergic to penicillins.  MEDICATIONS:  Current Outpatient Medications  Medication Sig Dispense Refill  . folic acid (FOLVITE) 1 MG tablet TAKE 1 TABLET BY MOUTH EVERY DAY 90 tablet 3  . furosemide (LASIX) 20 MG tablet TAKE 1 TABLET (20 MG TOTAL) BY MOUTH DAILY AS NEEDED FOR SWELLING 90 tablet 1  . loratadine (CLARITIN) 10 MG tablet Take 1  tablet (10 mg total) by mouth daily. (Patient not taking: Reported on 03/31/2020)    . meloxicam (MOBIC) 7.5 MG tablet Take 7.5 mg by mouth daily.     . Multiple Vitamin (MULTIVITAMIN) capsule Take 1 capsule by mouth daily. Reported on 02/24/2016    . Omega-3 Fatty Acids (FISH OIL) 1000 MG CAPS Take 1 capsule by mouth daily. Reported on 02/24/2016    . senna (SENOKOT) 8.6 MG tablet Take 1 tablet by mouth daily. Take as needed around time of chemotherapy    . spironolactone (ALDACTONE) 25 MG tablet TAKE 1 TABLET BY MOUTH EVERY DAY 90 tablet 3  . vitamin B-12 (CYANOCOBALAMIN) 1000 MCG tablet Take 1,000 mcg by mouth daily.    . NON FORMULARY Relief Factor with vitamin D and Turmeric    . tadalafil (CIALIS) 20 MG tablet Take one tablet every 2 days as needed 6 tablet 1   No current facility-administered medications for this visit.   Facility-Administered Medications Ordered in Other Visits  Medication Dose Route Frequency Provider Last Rate Last Admin  . sodium chloride 0.9 % injection 10 mL  10 mL Intracatheter PRN [name], MD   10 mL at 05/27/15 1543  . sodium chloride 0.9 % injection 10 mL  10 mL Intracatheter PRN [name], MD   10 mL at 06/17/15 1510  . sodium chloride 0.9 % injection 10 mL  10 mL Intravenous PRN [name], MD   10 mL at 07/08/15 1340    PHYSICAL EXAMINATION: ECOG PERFORMANCE STATUS: 0 - Asymptomatic  BP 126/77 (BP Location: Left Arm, Patient Position: Sitting)   Pulse 76   Temp (!) 95.2 F (35.1 C) (Tympanic)   Resp 16   Wt 214 lb (97.1 kg)   BMI 30.71 kg/m   Filed Weights   03/25/20 1422  Weight: 214 lb (97.1 kg)    Physical Exam  Constitutional: He is oriented to person, place, and time and well-developed, well-nourished, and in no distress.  HENT:  Head: Normocephalic and atraumatic.  Mouth/Throat: Oropharynx is clear and moist. No oropharyngeal exudate.  Eyes: Pupils are equal, round, and reactive to light.  Cardiovascular: Normal rate  and regular rhythm.  Pulmonary/Chest: No respiratory distress. He has no wheezes.  Abdominal: Soft. Bowel sounds are normal. He exhibits no distension and no mass. There is no abdominal tenderness. There is no rebound and no guarding.  Musculoskeletal:        General: Edema present. No tenderness. Normal range of motion.     Cervical back: Normal range of motion and neck supple.     Comments: Grade 1 swelling in the legs bilaterally.  Neurological: He is alert and oriented to person, place, and time.  Skin: Skin is warm.  Psychiatric: Affect normal.     LABORATORY DATA:  I have reviewed the data as listed    Component Value Date/Time   NA 134 (L) 03/25/2020 1342   NA 138 12/10/2014 1341   K 3.5 03/25/2020 1342   K 3.6 12/10/2014 1341   CL 97 (L) 03/25/2020 1342   CL 102 12/10/2014 1341  CO2 26 03/25/2020 1342   CO2 30 12/10/2014 1341   GLUCOSE 131 (H) 03/25/2020 1342   GLUCOSE 160 (H) 12/10/2014 1341   BUN 23 (H) 03/25/2020 1342   BUN 21 (H) 12/10/2014 1341   CREATININE 1.03 03/25/2020 1342   CREATININE 1.10 03/25/2015 1453   CALCIUM 8.5 (L) 03/25/2020 1342   CALCIUM 8.4 (L) 12/10/2014 1341   PROT 7.0 03/25/2020 1342   PROT 6.6 12/10/2014 1341   ALBUMIN 3.9 03/25/2020 1342   ALBUMIN 3.4 12/10/2014 1341   AST 23 03/25/2020 1342   AST 17 12/10/2014 1341   ALT 19 03/25/2020 1342   ALT 23 12/10/2014 1341   ALKPHOS 43 03/25/2020 1342   ALKPHOS 50 12/10/2014 1341   BILITOT 0.9 03/25/2020 1342   BILITOT 0.5 12/10/2014 1341   GFRNONAA >60 03/25/2020 1342   GFRNONAA >60 03/25/2015 1453   GFRAA >60 03/25/2020 1342   GFRAA >60 03/25/2015 1453    No results found for: SPEP, UPEP  Lab Results  Component Value Date   WBC 5.7 03/25/2020   NEUTROABS 3.3 03/25/2020   HGB 13.7 03/25/2020   HCT 39.6 03/25/2020   MCV 93.6 03/25/2020   PLT 170 03/25/2020      Chemistry      Component Value Date/Time   NA 134 (L) 03/25/2020 1342   NA 138 12/10/2014 1341   K 3.5  03/25/2020 1342   K 3.6 12/10/2014 1341   CL 97 (L) 03/25/2020 1342   CL 102 12/10/2014 1341   CO2 26 03/25/2020 1342   CO2 30 12/10/2014 1341   BUN 23 (H) 03/25/2020 1342   BUN 21 (H) 12/10/2014 1341   CREATININE 1.03 03/25/2020 1342   CREATININE 1.10 03/25/2015 1453      Component Value Date/Time   CALCIUM 8.5 (L) 03/25/2020 1342   CALCIUM 8.4 (L) 12/10/2014 1341   ALKPHOS 43 03/25/2020 1342   ALKPHOS 50 12/10/2014 1341   AST 23 03/25/2020 1342   AST 17 12/10/2014 1341   ALT 19 03/25/2020 1342   ALT 23 12/10/2014 1341   BILITOT 0.9 03/25/2020 1342   BILITOT 0.5 12/10/2014 1341       RADIOGRAPHIC STUDIES: I have personally reviewed the radiological images as listed and agreed with the findings in the report. No results found.   ASSESSMENT & PLAN:  Cancer of hilus of left lung ([place]) # Adenocarcinoma the lung; stage IV; NOV 30th 2020-CT scan chest/a/p-shows no evidence of metastatic disease; stable  # Proceed with Alimta chemotherapy. Labs today reviewed;  acceptable for treatment today. Will order scans at next visit.   # swelling in the hands/feet-grade 1 asymptomatic.? Alimta; monitor. STABLE.   #Port access malfunction-recommend dye study.  # Itchy palms/soles-question Alimta.  Grade 1.  Continue antihistamines.  # DISPOSITION:  # Alimta today # dye test asap # Follow up in 3 weeks MD-labs- cbc/cmp; alimta- Dr.B  Addendum: Dye study-fibrin sheath present.  Recommend TPA.   No orders of the defined types were placed in this encounter.  All questions were answered. The patient knows to call the clinic with any problems, questions or concerns.      [name], MD 04/03/2020 10:17 PM

## 2020-03-28 NOTE — Progress Notes (Signed)
Established patient visit  I,Michaeljames Milnes,acting as a scribe for Wilhemena Durie, MD.,have documented all relevant documentation on the behalf of Wilhemena Durie, MD,as directed by  Wilhemena Durie, MD while in the presence of Wilhemena Durie, MD.   Patient: Mitchell Mosley   DOB: November 22, 1961   59 y.o. Male  MRN: [id number] Visit Date: 03/31/2020  Today's healthcare provider: Wilhemena Durie, MD   Chief Complaint  Patient presents with   Follow-up   Subjective    HPI Patient is doing well.  He has been in a couple years as he is followed routinely by Dr. B for lung cancer.  He continues to read see treatment has been doing very well.  Patient has had both Covid vaccines.  Past Medical History:  Diagnosis Date   Allergy    Lung cancer ([place])    Mild valvular heart disease Nov. 2014   per 2 D Echocardiogram done for persistent leg edema, monitored by cardiology   Peripheral edema        Medications: Outpatient Medications Prior to Visit  Medication Sig   folic acid (FOLVITE) 1 MG tablet TAKE 1 TABLET BY MOUTH EVERY DAY   furosemide (LASIX) 20 MG tablet TAKE 1 TABLET (20 MG TOTAL) BY MOUTH DAILY AS NEEDED FOR SWELLING   meloxicam (MOBIC) 7.5 MG tablet Take 7.5 mg by mouth daily.    Multiple Vitamin (MULTIVITAMIN) capsule Take 1 capsule by mouth daily. Reported on 02/24/2016   NON FORMULARY Relief Factor with vitamin D and Turmeric   Omega-3 Fatty Acids (FISH OIL) 1000 MG CAPS Take 1 capsule by mouth daily. Reported on 02/24/2016   senna (SENOKOT) 8.6 MG tablet Take 1 tablet by mouth daily. Take as needed around time of chemotherapy   spironolactone (ALDACTONE) 25 MG tablet TAKE 1 TABLET BY MOUTH EVERY DAY   [DISCONTINUED] tadalafil (CIALIS) 20 MG tablet TAKE 1 TABLET BY MOUTH EVERY 2 DAYS AS NEEDED.   loratadine (CLARITIN) 10 MG tablet Take 1 tablet (10 mg total) by mouth daily. (Patient not taking: Reported on 03/31/2020)   vitamin B-12  (CYANOCOBALAMIN) 1000 MCG tablet Take 1,000 mcg by mouth daily.   Facility-Administered Medications Prior to Visit  Medication Dose Route Frequency Provider   sodium chloride 0.9 % injection 10 mL  10 mL Intracatheter PRN [name], MD   sodium chloride 0.9 % injection 10 mL  10 mL Intracatheter PRN [name], MD   sodium chloride 0.9 % injection 10 mL  10 mL Intravenous PRN [name], MD    Review of Systems  Constitutional: Negative for appetite change, chills and fever.  Respiratory: Negative for chest tightness, shortness of breath and wheezing.   Cardiovascular: Negative for chest pain and palpitations.  Gastrointestinal: Negative for abdominal pain, nausea and vomiting.    Last metabolic panel Lab Results  Component Value Date   GLUCOSE 131 (H) 03/25/2020   NA 134 (L) 03/25/2020   K 3.5 03/25/2020   CL 97 (L) 03/25/2020   CO2 26 03/25/2020   BUN 23 (H) 03/25/2020   CREATININE 1.03 03/25/2020   GFRNONAA >60 03/25/2020   GFRAA >60 03/25/2020   CALCIUM 8.5 (L) 03/25/2020   PROT 7.0 03/25/2020   ALBUMIN 3.9 03/25/2020   BILITOT 0.9 03/25/2020   ALKPHOS 43 03/25/2020   AST 23 03/25/2020   ALT 19 03/25/2020   ANIONGAP 11 03/25/2020       Objective    BP 113/76 (BP Location:  Left Arm, Patient Position: Sitting, Cuff Size: Large)    Pulse 76    Temp (!) 96.8 F (36 C) (Other (Comment))    Resp 16    Ht 5\' 10"  (1.778 m)    Wt 210 lb (95.3 kg)    SpO2 98%    BMI 30.13 kg/m    Depression screen [facility] 2/9 03/31/2020 04/11/2017  Decreased Interest 0 0  Down, Depressed, Hopeless 0 0  PHQ - 2 Score 0 0  Altered sleeping 0 0  Tired, decreased energy 0 0  Change in appetite 0 0  Feeling bad or failure about yourself  0 0  Trouble concentrating 0 0  Moving slowly or fidgety/restless 0 0  Suicidal thoughts 0 0  PHQ-9 Score 0 0  Difficult doing work/chores Not difficult at all -  Some recent data might be hidden    BP Readings from Last 3 Encounters:   03/31/20 113/76  03/25/20 126/77  03/04/20 116/83   Wt Readings from Last 3 Encounters:  03/31/20 210 lb (95.3 kg)  03/25/20 214 lb (97.1 kg)  03/04/20 218 lb (98.9 kg)      Physical Exam Vitals and nursing note reviewed. Exam conducted with a chaperone present.  Constitutional:      Appearance: Normal appearance. He is normal weight.  HENT:     Right Ear: Tympanic membrane normal.     Left Ear: Tympanic membrane normal.     Nose: Nose normal.     Mouth/Throat:     Mouth: Mucous membranes are moist.  Cardiovascular:     Rate and Rhythm: Normal rate and regular rhythm.     Pulses: Normal pulses.     Heart sounds: Normal heart sounds.  Pulmonary:     Effort: Pulmonary effort is normal.     Breath sounds: Normal breath sounds.  Abdominal:     General: Bowel sounds are normal.     Palpations: Abdomen is soft.  Musculoskeletal:        General: Normal range of motion.     Cervical back: Normal range of motion and neck supple.     Comments: Trace edema  Skin:    General: Skin is warm.  Neurological:     Mental Status: He is alert.  Psychiatric:        Mood and Affect: Mood normal.        Behavior: Behavior normal.         No results found for any visits on 03/31/20.  Assessment & Plan    1. Cancer of hilus of left lung (HCC) Stable, followed by oncology regularly.  Still on chemotherapy  2. Colon cancer screening Patient has never had screening colonoscopy.  This is arranged. - Ambulatory referral to gastroenterology for colonoscopy  3. Bilateral lower extremity edema Due to old chemotherapy.  He still plays tennis regularly.  4. Thrombocytopenia ([place])   5. Thoracic aortic aneurysm without rupture ([place]) Work-up underway I will see him back in the summer fall for a physical   No follow-ups on file.         Richard Cranford Mon, MD  [facility] [phone] (phone) [phone] (fax)  [place]

## 2020-03-29 ENCOUNTER — Other Ambulatory Visit: Payer: Self-pay

## 2020-03-29 DIAGNOSIS — I712 Thoracic aortic aneurysm, without rupture, unspecified: Secondary | ICD-10-CM

## 2020-03-31 ENCOUNTER — Other Ambulatory Visit: Payer: Self-pay

## 2020-03-31 ENCOUNTER — Encounter: Payer: Self-pay | Admitting: Family Medicine

## 2020-03-31 ENCOUNTER — Ambulatory Visit (INDEPENDENT_AMBULATORY_CARE_PROVIDER_SITE_OTHER): Payer: BC Managed Care – PPO | Admitting: Family Medicine

## 2020-03-31 VITALS — BP 113/76 | HR 76 | Temp 96.8°F | Resp 16 | Ht 70.0 in | Wt 210.0 lb

## 2020-03-31 DIAGNOSIS — I712 Thoracic aortic aneurysm, without rupture, unspecified: Secondary | ICD-10-CM

## 2020-03-31 DIAGNOSIS — C3402 Malignant neoplasm of left main bronchus: Secondary | ICD-10-CM

## 2020-03-31 DIAGNOSIS — R600 Localized edema: Secondary | ICD-10-CM

## 2020-03-31 DIAGNOSIS — D696 Thrombocytopenia, unspecified: Secondary | ICD-10-CM | POA: Diagnosis not present

## 2020-03-31 DIAGNOSIS — Z1211 Encounter for screening for malignant neoplasm of colon: Secondary | ICD-10-CM | POA: Diagnosis not present

## 2020-03-31 MED ORDER — TADALAFIL 20 MG PO TABS: ORAL_TABLET | ORAL | 1 refills | Status: DC

## 2020-03-31 NOTE — Progress Notes (Signed)
H/T-C- please schedule for TPA. Thanks GB  ------------------------------------------------------------ Hi- wanted to inform you that the x-rays shows the scar tissue build up. Recommend medication in IV to break the scar down. My staff will reach out to you re: the appt.  Call us if any questions Dr.B

## 2020-04-01 NOTE — Progress Notes (Signed)
Spoke with patient. Patient given an apt for 1pm. On Monday for TPA.

## 2020-04-04 ENCOUNTER — Inpatient Hospital Stay: Payer: BC Managed Care – PPO | Attending: Internal Medicine

## 2020-04-04 ENCOUNTER — Other Ambulatory Visit: Payer: Self-pay

## 2020-04-04 VITALS — BP 136/99 | HR 82 | Resp 18

## 2020-04-04 DIAGNOSIS — Z5111 Encounter for antineoplastic chemotherapy: Secondary | ICD-10-CM | POA: Diagnosis not present

## 2020-04-04 DIAGNOSIS — C3402 Malignant neoplasm of left main bronchus: Secondary | ICD-10-CM | POA: Diagnosis not present

## 2020-04-04 DIAGNOSIS — M7989 Other specified soft tissue disorders: Secondary | ICD-10-CM | POA: Diagnosis not present

## 2020-04-04 DIAGNOSIS — L299 Pruritus, unspecified: Secondary | ICD-10-CM | POA: Insufficient documentation

## 2020-04-04 DIAGNOSIS — Z452 Encounter for adjustment and management of vascular access device: Secondary | ICD-10-CM

## 2020-04-04 MED ORDER — HEPARIN SOD (PORK) LOCK FLUSH 100 UNIT/ML IV SOLN
500.0000 [IU] | Freq: Once | INTRAVENOUS | Status: AC
  Administered 2020-04-04: 500 [IU] via INTRAVENOUS
  Filled 2020-04-04: qty 5

## 2020-04-04 MED ORDER — ALTEPLASE 2 MG IJ SOLR
2.0000 mg | Freq: Once | INTRAMUSCULAR | Status: AC
  Administered 2020-04-04: 2 mg
  Filled 2020-04-04: qty 2

## 2020-04-04 MED ORDER — HEPARIN SOD (PORK) LOCK FLUSH 100 UNIT/ML IV SOLN
INTRAVENOUS | Status: AC
  Filled 2020-04-04: qty 5

## 2020-04-04 NOTE — Progress Notes (Signed)
TPA administered at 1353, at 30 minutes a ting of blood was noted from port . After an additional 90 minutes, a ting of blood was noted from port. MD made aware, no new orders given at this time. Pt updated and all questions answered at this time.   Carollynn Pennywell [employer]

## 2020-04-08 NOTE — Progress Notes (Signed)
Pharmacist Chemotherapy Monitoring - Follow Up Assessment    I verify that I have reviewed each item in the below checklist:  . Regimen for the patient is scheduled for the appropriate day and plan matches scheduled date. Marland Kitchen Appropriate non-routine labs are ordered dependent on drug ordered. . If applicable, additional medications reviewed and ordered per protocol based on lifetime cumulative doses and/or treatment regimen.   Plan for follow-up and/or issues identified: No . I-vent associated with next due treatment: No . MD and/or nursing notified: No  Mitchell Mosley K 04/08/2020 8:01 AM

## 2020-04-14 ENCOUNTER — Other Ambulatory Visit: Payer: Self-pay | Admitting: *Deleted

## 2020-04-14 DIAGNOSIS — C3402 Malignant neoplasm of left main bronchus: Secondary | ICD-10-CM

## 2020-04-15 ENCOUNTER — Inpatient Hospital Stay: Payer: BC Managed Care – PPO

## 2020-04-15 ENCOUNTER — Other Ambulatory Visit: Payer: Self-pay

## 2020-04-15 ENCOUNTER — Inpatient Hospital Stay (HOSPITAL_BASED_OUTPATIENT_CLINIC_OR_DEPARTMENT_OTHER): Payer: BC Managed Care – PPO | Admitting: Internal Medicine

## 2020-04-15 DIAGNOSIS — M7989 Other specified soft tissue disorders: Secondary | ICD-10-CM | POA: Diagnosis not present

## 2020-04-15 DIAGNOSIS — C3402 Malignant neoplasm of left main bronchus: Secondary | ICD-10-CM | POA: Diagnosis not present

## 2020-04-15 DIAGNOSIS — Z5111 Encounter for antineoplastic chemotherapy: Secondary | ICD-10-CM

## 2020-04-15 DIAGNOSIS — L299 Pruritus, unspecified: Secondary | ICD-10-CM | POA: Diagnosis not present

## 2020-04-15 LAB — CBC WITH DIFFERENTIAL/PLATELET
Abs Immature Granulocytes: 0.02 10*3/uL (ref 0.00–0.07)
Basophils Absolute: 0 10*3/uL (ref 0.0–0.1)
Basophils Relative: 1 %
Eosinophils Absolute: 0.1 10*3/uL (ref 0.0–0.5)
Eosinophils Relative: 2 %
HCT: 40.3 % (ref 39.0–52.0)
Hemoglobin: 14 g/dL (ref 13.0–17.0)
Immature Granulocytes: 0 %
Lymphocytes Relative: 31 %
Lymphs Abs: 1.7 10*3/uL (ref 0.7–4.0)
MCH: 32.5 pg (ref 26.0–34.0)
MCHC: 34.7 g/dL (ref 30.0–36.0)
MCV: 93.5 fL (ref 80.0–100.0)
Monocytes Absolute: 0.5 10*3/uL (ref 0.1–1.0)
Monocytes Relative: 9 %
Neutro Abs: 3.1 10*3/uL (ref 1.7–7.7)
Neutrophils Relative %: 57 %
Platelets: 176 10*3/uL (ref 150–400)
RBC: 4.31 MIL/uL (ref 4.22–5.81)
RDW: 13 % (ref 11.5–15.5)
WBC: 5.5 10*3/uL (ref 4.0–10.5)
nRBC: 0 % (ref 0.0–0.2)

## 2020-04-15 LAB — COMPREHENSIVE METABOLIC PANEL
ALT: 18 U/L (ref 0–44)
AST: 24 U/L (ref 15–41)
Albumin: 4 g/dL (ref 3.5–5.0)
Alkaline Phosphatase: 44 U/L (ref 38–126)
Anion gap: 11 (ref 5–15)
BUN: 20 mg/dL (ref 6–20)
CO2: 25 mmol/L (ref 22–32)
Calcium: 9.1 mg/dL (ref 8.9–10.3)
Chloride: 100 mmol/L (ref 98–111)
Creatinine, Ser: 1.24 mg/dL (ref 0.61–1.24)
GFR calc Af Amer: >60 mL/min (ref >60)
GFR calc non Af Amer: >60 mL/min (ref >60)
Glucose, Bld: 175 mg/dL — ABNORMAL HIGH (ref 70–99)
Potassium: 3.7 mmol/L (ref 3.5–5.1)
Sodium: 136 mmol/L (ref 135–145)
Total Bilirubin: 0.8 mg/dL (ref 0.3–1.2)
Total Protein: 7.2 g/dL (ref 6.5–8.1)

## 2020-04-15 MED ORDER — HEPARIN SOD (PORK) LOCK FLUSH 100 UNIT/ML IV SOLN
INTRAVENOUS | Status: AC
  Filled 2020-04-15: qty 5

## 2020-04-15 MED ORDER — SODIUM CHLORIDE 0.9 % IV SOLN
Freq: Once | INTRAVENOUS | Status: AC
  Filled 2020-04-15: qty 250

## 2020-04-15 MED ORDER — PROCHLORPERAZINE MALEATE 10 MG PO TABS
10.0000 mg | ORAL_TABLET | Freq: Once | ORAL | Status: AC
  Administered 2020-04-15: 10 mg via ORAL
  Filled 2020-04-15: qty 1

## 2020-04-15 MED ORDER — HEPARIN SOD (PORK) LOCK FLUSH 100 UNIT/ML IV SOLN
500.0000 [IU] | Freq: Once | INTRAVENOUS | Status: AC | PRN
  Administered 2020-04-15: 500 [IU]
  Filled 2020-04-15: qty 5

## 2020-04-15 MED ORDER — DEXAMETHASONE SODIUM PHOSPHATE 10 MG/ML IJ SOLN
6.0000 mg | Freq: Once | INTRAMUSCULAR | Status: AC
  Administered 2020-04-15: 6 mg via INTRAVENOUS
  Filled 2020-04-15: qty 1

## 2020-04-15 MED ORDER — SODIUM CHLORIDE 0.9% FLUSH
10.0000 mL | INTRAVENOUS | Status: DC | PRN
  Filled 2020-04-15: qty 10

## 2020-04-15 MED ORDER — SODIUM CHLORIDE 0.9 % IV SOLN
500.0000 mg/m2 | Freq: Once | INTRAVENOUS | Status: AC
  Administered 2020-04-15: 1100 mg via INTRAVENOUS
  Filled 2020-04-15: qty 40

## 2020-04-15 NOTE — Progress Notes (Signed)
Pt in for follow up, denies any changes or concerns today.

## 2020-04-15 NOTE — Assessment & Plan Note (Addendum)
#   Adenocarcinoma the lung; stage IV; NOV 30th 2020-CT scan chest/a/p-shows no evidence of metastatic disease; STABLE.   # Proceed with Alimta chemotherapy. Labs today reviewed;  acceptable for treatment today.  Will order CT scans today.  # swelling in the hands/feet-grade 1 asymptomatic.? Alimta; monitor.  Stable  #Port access malfunction-post TPA.  Improved.  # Itchy palms/soles-question Alimta.  Grade 1.  Stable.  Continue antihistamine as needed.  # DISPOSITION:  # Alimta today # Follow up in 3 weeks MD-labs- cbc/cmp; alimta; prior CT C/A/P- Dr.B

## 2020-04-15 NOTE — Progress Notes (Signed)
[place] OFFICE PROGRESS NOTE  Patient Care Team: [name], MD as PCP - General [facility] Medicine)  Cancer Staging No matching staging information was found for the patient.   Oncology History Overview Note  # 2012- METASTATIC ADENO CA of LEFT LUNG [acinar pattern] s/p MED LN Bx [NEG- EGFR/K-ras/ALK mutation];[name]; [name]; CT JULY  2016- NED; JAN 2018- NED;   # AUG 2020-clinical trial closed; continue maintenance Alimta  # Thoracic aortic aneurysm-4.0 cm in 2016; currently 4.6 cm.  s/p referral to Dr. Faith Rogue.; MRI in 6 months planned.  ----------------------------------------------------------    DIAGNOSIS: [name]  STAGE: 4       ;GOALS: Palliative  CURRENT/MOST RECENT THERAPY [ ] Alimta maintenance    Cancer of hilus of left lung ([place])  07/20/2016 Initial Diagnosis   Cancer of hilus of left lung ([place])   12/11/2019 -  Chemotherapy   The patient had PEMEtrexed (ALIMTA) 1,100 mg in sodium chloride 0.9 % 100 mL chemo infusion, 500 mg/m2 = 1,100 mg, Intravenous,  Once, 7 of 9 cycles Administration: 1,100 mg (12/11/2019), 1,100 mg (01/22/2020), 1,100 mg (02/12/2020), 1,100 mg (03/25/2020), 1,100 mg (01/01/2020), 1,100 mg (03/04/2020), 1,100 mg (04/15/2020)  for chemotherapy treatment.       INTERVAL HISTORY:  Mitchell Mosley 59 y.o.  male pleasant patient above history of metastatic adenocarcinoma the lung-NED currently on maintenance Alimta is here for follow-up.  Patient is physically active.  Complains of intermittent itchy palms and soles.  No skin rash.  Mild arthritic pain in joints.  No significant shortness of breath or cough. no Headaches.  Review of Systems  Constitutional: Negative for chills, diaphoresis, fever, malaise/fatigue and weight loss.  HENT: Negative for nosebleeds and sore throat.   Eyes: Negative for double  vision.  Respiratory: Negative for hemoptysis, shortness of breath and wheezing.   Cardiovascular: Positive for leg swelling. Negative for chest pain, palpitations and orthopnea.  Gastrointestinal: Negative for abdominal pain, blood in stool, constipation, diarrhea, heartburn, melena, nausea and vomiting.  Genitourinary: Negative for dysuria, frequency and urgency.  Musculoskeletal: Positive for joint pain.  Skin: Negative.  Negative for itching and rash.  Neurological: Negative for dizziness, tingling, focal weakness, weakness and headaches.  Endo/Heme/Allergies: Does not bruise/bleed easily.  Psychiatric/Behavioral: Negative for depression. The patient is not nervous/anxious and does not have insomnia.       PAST MEDICAL HISTORY :  Past Medical History:  Diagnosis Date  . Allergy   . Lung cancer ([place])   . Mild valvular heart disease Nov. 2014   per 2 D Echocardiogram done for persistent leg edema, monitored by cardiology  . Peripheral edema     PAST SURGICAL HISTORY :   Past Surgical History:  Procedure Laterality Date  . ELBOW SURGERY Right 2011  . Left anterior thoracotomy with biopsy  March 2012  . PORTACATH PLACEMENT  March 2012  . TONSILLECTOMY  as child    FAMILY HISTORY :   Family History  Problem Relation Age of Onset  . Leukemia Father   . Diabetes Mother     SOCIAL HISTORY:   Social History   Tobacco Use  . Smoking status: Never Smoker  . Smokeless tobacco: Never Used  Substance Use Topics  . Alcohol use: Yes    Alcohol/week: 0.0 standard drinks    Comment: "social drinker"   . Drug use: No    ALLERGIES:  is allergic to penicillins.  MEDICATIONS:  Current Outpatient Medications  Medication Sig Dispense Refill  . folic acid (FOLVITE) 1 MG tablet TAKE 1 TABLET BY MOUTH EVERY DAY 90 tablet 3  . furosemide (LASIX) 20 MG tablet TAKE 1 TABLET (20 MG TOTAL) BY MOUTH DAILY AS NEEDED FOR SWELLING 90 tablet 1  . Multiple Vitamin (MULTIVITAMIN) capsule Take  1 capsule by mouth daily. Reported on 02/24/2016    . NON FORMULARY Relief Factor with vitamin D and Turmeric    . Omega-3 Fatty Acids (FISH OIL) 1000 MG CAPS Take 1 capsule by mouth daily. Reported on 02/24/2016    . senna (SENOKOT) 8.6 MG tablet Take 1 tablet by mouth daily. Take as needed around time of chemotherapy    . spironolactone (ALDACTONE) 25 MG tablet TAKE 1 TABLET BY MOUTH EVERY DAY 90 tablet 3  . tadalafil (CIALIS) 20 MG tablet Take one tablet every 2 days as needed 6 tablet 1  . vitamin B-12 (CYANOCOBALAMIN) 1000 MCG tablet Take 1,000 mcg by mouth daily.    . meloxicam (MOBIC) 7.5 MG tablet Take 7.5 mg by mouth daily.      No current facility-administered medications for this visit.   Facility-Administered Medications Ordered in Other Visits  Medication Dose Route Frequency Provider Last Rate Last Admin  . sodium chloride 0.9 % injection 10 mL  10 mL Intracatheter PRN [name], MD   10 mL at 05/27/15 1543  . sodium chloride 0.9 % injection 10 mL  10 mL Intracatheter PRN [name], MD   10 mL at 06/17/15 1510  . sodium chloride 0.9 % injection 10 mL  10 mL Intravenous PRN [name], MD   10 mL at 07/08/15 1340    PHYSICAL EXAMINATION: ECOG PERFORMANCE STATUS: 0 - Asymptomatic  BP (!) 148/87 (BP Location: Right Arm, Patient Position: Sitting)   Pulse 89   Temp (!) 96.1 F (35.6 C) (Tympanic)   Resp 18   Wt 213 lb 6.4 oz (96.8 kg)   BMI 30.62 kg/m   Filed Weights   04/15/20 1414  Weight: 213 lb 6.4 oz (96.8 kg)    Physical Exam  Constitutional: He is oriented to person, place, and time and well-developed, well-nourished, and in no distress.  HENT:  Head: Normocephalic and atraumatic.  Mouth/Throat: Oropharynx is clear and moist. No oropharyngeal exudate.  Eyes: Pupils are equal, round, and reactive to light.  Cardiovascular: Normal rate and regular rhythm.  Pulmonary/Chest: No respiratory distress. He has no wheezes.  Abdominal: Soft. Bowel sounds  are normal. He exhibits no distension and no mass. There is no abdominal tenderness. There is no rebound and no guarding.  Musculoskeletal:        General: Edema present. No tenderness. Normal range of motion.     Cervical back: Normal range of motion and neck supple.     Comments: Grade 1 swelling in the legs bilaterally.  Neurological: He is alert and oriented to person, place, and time.  Skin: Skin is warm.  Psychiatric: Affect normal.     LABORATORY DATA:  I have reviewed the data as listed    Component Value Date/Time   NA 136 04/15/2020 1356   NA 138 12/10/2014 1341   K 3.7 04/15/2020 1356   K 3.6 12/10/2014 1341   CL 100 04/15/2020 1356   CL 102 12/10/2014 1341   CO2 25 04/15/2020 1356   CO2 30 12/10/2014 1341   GLUCOSE 175 (H) 04/15/2020 1356   GLUCOSE 160 (  H) 12/10/2014 1341   BUN 20 04/15/2020 1356   BUN 21 (H) 12/10/2014 1341   CREATININE 1.24 04/15/2020 1356   CREATININE 1.10 03/25/2015 1453   CALCIUM 9.1 04/15/2020 1356   CALCIUM 8.4 (L) 12/10/2014 1341   PROT 7.2 04/15/2020 1356   PROT 6.6 12/10/2014 1341   ALBUMIN 4.0 04/15/2020 1356   ALBUMIN 3.4 12/10/2014 1341   AST 24 04/15/2020 1356   AST 17 12/10/2014 1341   ALT 18 04/15/2020 1356   ALT 23 12/10/2014 1341   ALKPHOS 44 04/15/2020 1356   ALKPHOS 50 12/10/2014 1341   BILITOT 0.8 04/15/2020 1356   BILITOT 0.5 12/10/2014 1341   GFRNONAA >60 04/15/2020 1356   GFRNONAA >60 03/25/2015 1453   GFRAA >60 04/15/2020 1356   GFRAA >60 03/25/2015 1453    No results found for: SPEP, UPEP  Lab Results  Component Value Date   WBC 5.5 04/15/2020   NEUTROABS 3.1 04/15/2020   HGB 14.0 04/15/2020   HCT 40.3 04/15/2020   MCV 93.5 04/15/2020   PLT 176 04/15/2020      Chemistry      Component Value Date/Time   NA 136 04/15/2020 1356   NA 138 12/10/2014 1341   K 3.7 04/15/2020 1356   K 3.6 12/10/2014 1341   CL 100 04/15/2020 1356   CL 102 12/10/2014 1341   CO2 25 04/15/2020 1356   CO2 30 12/10/2014  1341   BUN 20 04/15/2020 1356   BUN 21 (H) 12/10/2014 1341   CREATININE 1.24 04/15/2020 1356   CREATININE 1.10 03/25/2015 1453      Component Value Date/Time   CALCIUM 9.1 04/15/2020 1356   CALCIUM 8.4 (L) 12/10/2014 1341   ALKPHOS 44 04/15/2020 1356   ALKPHOS 50 12/10/2014 1341   AST 24 04/15/2020 1356   AST 17 12/10/2014 1341   ALT 18 04/15/2020 1356   ALT 23 12/10/2014 1341   BILITOT 0.8 04/15/2020 1356   BILITOT 0.5 12/10/2014 1341       RADIOGRAPHIC STUDIES: I have personally reviewed the radiological images as listed and agreed with the findings in the report. No results found.   ASSESSMENT & PLAN:  Cancer of hilus of left lung ([place]) # Adenocarcinoma the lung; stage IV; NOV 30th 2020-CT scan chest/a/p-shows no evidence of metastatic disease; STABLE.   # Proceed with Alimta chemotherapy. Labs today reviewed;  acceptable for treatment today.  Will order CT scans today.  # swelling in the hands/feet-grade 1 asymptomatic.? Alimta; monitor.  Stable  #Port access malfunction-post TPA.  Improved.  # Itchy palms/soles-question Alimta.  Grade 1.  Stable.  Continue antihistamine as needed.  # DISPOSITION:  # Alimta today # Follow up in 3 weeks MD-labs- cbc/cmp; alimta; prior CT C/A/P- Dr.B    Orders Placed This Encounter  Procedures  . CT Chest W Contrast    Standing Status:   Future    Standing Expiration Date:   04/15/2021    Order Specific Question:   ** REASON FOR EXAM (FREE TEXT)    Answer:   lung cancer    Order Specific Question:   If indicated for the ordered procedure, I authorize the administration of contrast media per Radiology protocol    Answer:   Yes    Order Specific Question:   Preferred imaging location?    Answer:   [facility]    Order Specific Question:   Radiology Contrast Protocol - do NOT remove file path    Answer:   _0   [email or web address]  . CT Abdomen Pelvis W Contrast    Standing Status:   Future    Standing  Expiration Date:   04/15/2021    Order Specific Question:   ** REASON FOR EXAM (FREE TEXT)    Answer:   lung cancer    Order Specific Question:   If indicated for the ordered procedure, I authorize the administration of contrast media per Radiology protocol    Answer:   Yes    Order Specific Question:   Preferred imaging location?    Answer:   [facility]    Order Specific Question:   Is Oral Contrast requested for this exam?    Answer:   Yes, Per Radiology protocol    Order Specific Question:   Radiology Contrast Protocol - do NOT remove file path    Answer:   _0 [email or web address]   All questions were answered. The patient knows to call the clinic with any problems, questions or concerns.      [name], MD 04/17/2020 9:32 AM

## 2020-04-22 NOTE — Progress Notes (Signed)
Trena Platt Cummings,acting as a scribe for Wilhemena Durie, MD.,have documented all relevant documentation on the behalf of Wilhemena Durie, MD,as directed by  Wilhemena Durie, MD while in the presence of Wilhemena Durie, MD.  Complete physical exam   Patient: Mitchell Mosley   DOB: October 12, 1961   59 y.o. Male  MRN: [id number] Visit Date: 04/27/2020  Today's healthcare provider: Wilhemena Durie, MD   Chief Complaint  Patient presents with  . Annual Exam   Subjective    Mitchell Mosley is a 59 y.o. male who presents today for a complete physical exam.  He reports consuming a low fat and low sodium diet. The patient does not participate in regular exercise at present. He generally feels well. He reports sleeping well. He does have additional problems to discuss today.   HPI   Patient would like to discuss Palm of hands and foot itching.  He has been told this is probably a side effect from chemo.  There is no rash, only itching. He does have a ED and would like a refill on his Viagra. He is followed regularly by oncology for treatment of his lung cancer.  He is doing well. Past Medical History:  Diagnosis Date  . Allergy   . Lung cancer ([place])   . Mild valvular heart disease Nov. 2014   per 2 D Echocardiogram done for persistent leg edema, monitored by cardiology  . Peripheral edema    Past Surgical History:  Procedure Laterality Date  . ELBOW SURGERY Right 2011  . Left anterior thoracotomy with biopsy  March 2012  . PORTACATH PLACEMENT  March 2012  . TONSILLECTOMY  as child   Social History   Socioeconomic History  . Marital status: Married    Spouse name: Not on file  . Number of children: Not on file  . Years of education: Not on file  . Highest education level: Not on file  Occupational History  . Not on file  Tobacco Use  . Smoking status: Never Smoker  . Smokeless tobacco: Never Used  Substance and Sexual Activity  . Alcohol use: Yes   Alcohol/week: 0.0 standard drinks    Comment: "social drinker"   . Drug use: No  . Sexual activity: Yes  Other Topics Concern  . Not on file  Social History Narrative  . Not on file   Social Determinants of Health   Financial Resource Strain:   . Difficulty of Paying Living Expenses:   Food Insecurity:   . Worried About Charity fundraiser in the Last Year:   . Arboriculturist in the Last Year:   Transportation Needs:   . Film/video editor (Medical):   Marland Kitchen Lack of Transportation (Non-Medical):   Physical Activity:   . Days of Exercise per Week:   . Minutes of Exercise per Session:   Stress:   . Feeling of Stress :   Social Connections:   . Frequency of Communication with Friends and Family:   . Frequency of Social Gatherings with Friends and Family:   . Attends Religious Services:   . Active Member of Clubs or Organizations:   . Attends Archivist Meetings:   Marland Kitchen Marital Status:   Intimate Partner Violence:   . Fear of Current or Ex-Partner:   . Emotionally Abused:   Marland Kitchen Physically Abused:   . Sexually Abused:    Family Status  Relation Name Status  . Father  Deceased at age 81  . Mother  Alive  . Sister  Alive  . Brother  Alive  . Brother  Alive  . Brother  Alive   Family History  Problem Relation Age of Onset  . Leukemia Father   . Diabetes Mother    Allergies  Allergen Reactions  . Penicillins     Reaction and severity unknown    Patient Care Team: [name], MD as PCP - General (Family Medicine)   Medications: Outpatient Medications Prior to Visit  Medication Sig  . folic acid (FOLVITE) 1 MG tablet TAKE 1 TABLET BY MOUTH EVERY DAY  . furosemide (LASIX) 20 MG tablet TAKE 1 TABLET (20 MG TOTAL) BY MOUTH DAILY AS NEEDED FOR SWELLING  . meloxicam (MOBIC) 7.5 MG tablet Take 7.5 mg by mouth daily.   . Multiple Vitamin (MULTIVITAMIN) capsule Take 1 capsule by mouth daily. Reported on 02/24/2016  . NON FORMULARY Relief Factor with  vitamin D and Turmeric  . Omega-3 Fatty Acids (FISH OIL) 1000 MG CAPS Take 1 capsule by mouth daily. Reported on 02/24/2016  . senna (SENOKOT) 8.6 MG tablet Take 1 tablet by mouth daily. Take as needed around time of chemotherapy  . spironolactone (ALDACTONE) 25 MG tablet TAKE 1 TABLET BY MOUTH EVERY DAY  . tadalafil (CIALIS) 20 MG tablet Take one tablet every 2 days as needed  . vitamin B-12 (CYANOCOBALAMIN) 1000 MCG tablet Take 1,000 mcg by mouth daily.   Facility-Administered Medications Prior to Visit  Medication Dose Route Frequency Provider  . sodium chloride 0.9 % injection 10 mL  10 mL Intracatheter PRN [name], MD  . sodium chloride 0.9 % injection 10 mL  10 mL Intracatheter PRN Ma Hillock, Sandeep, MD  . sodium chloride 0.9 % injection 10 mL  10 mL Intravenous PRN [name], MD    Review of Systems  Constitutional: Negative.   HENT: Negative.   Respiratory: Negative.   Cardiovascular: Negative.   Gastrointestinal: Negative.   Endocrine: Negative.   Genitourinary: Negative.   Musculoskeletal: Negative.   Skin: Negative.        Pruritus of the palms of the hands and the soles of the feet at times.  Allergic/Immunologic: Negative.   Neurological: Negative.   Hematological: Negative.   Psychiatric/Behavioral: Negative.        Objective    BP 125/80 (BP Location: Left Arm, Patient Position: Sitting, Cuff Size: Large)   Pulse 74   Temp (!) 97.1 F (36.2 C) (Temporal)   Ht 5\' 10"  (1.778 m)   Wt 209 lb 3.2 oz (94.9 kg)   BMI 30.02 kg/m  BP Readings from Last 3 Encounters:  04/27/20 125/80  04/15/20 (!) 148/87  04/04/20 (!) 136/99   Wt Readings from Last 3 Encounters:  04/27/20 209 lb 3.2 oz (94.9 kg)  04/15/20 213 lb 6.4 oz (96.8 kg)  03/31/20 210 lb (95.3 kg)      Physical Exam Vitals and nursing note reviewed. Exam conducted with a chaperone present.  Constitutional:      Appearance: Normal appearance. He is normal weight.  HENT:     Right Ear:  Tympanic membrane normal.     Left Ear: Tympanic membrane normal.     Nose: Nose normal.     Mouth/Throat:     Mouth: Mucous membranes are moist.  Eyes:     General: No scleral icterus.    Conjunctiva/sclera: Conjunctivae normal.  Cardiovascular:     Rate and Rhythm: Normal rate  and regular rhythm.     Pulses: Normal pulses.     Heart sounds: Normal heart sounds.  Pulmonary:     Effort: Pulmonary effort is normal.     Breath sounds: Normal breath sounds.  Abdominal:     General: Bowel sounds are normal.     Palpations: Abdomen is soft.  Genitourinary:    Penis: Normal.      Testes: Normal.  Musculoskeletal:     Cervical back: Normal range of motion and neck supple.     Comments: Trace edema  Skin:    General: Skin is warm and dry.  Neurological:     General: No focal deficit present.     Mental Status: He is alert and oriented to person, place, and time.  Psychiatric:        Mood and Affect: Mood normal.        Behavior: Behavior normal.        Thought Content: Thought content normal.        Judgment: Judgment normal.       Depression Screen  PHQ 2/9 Scores 04/27/2020 03/31/2020 04/11/2017  PHQ - 2 Score 0 0 0  PHQ- 9 Score 0 0 0    No results found for any visits on 04/27/20.  Assessment & Plan    Routine Health Maintenance and Physical Exam  Exercise Activities and Dietary recommendations Goals   None     Immunization History  Administered Date(s) Administered  . Influenza,inj,Quad PF,6+ Mos 08/19/2015, 09/21/2016, 10/07/2017, 09/05/2018, 10/09/2019  . PFIZER SARS-COV-2 Vaccination 02/05/2020, 02/26/2020  . Tdap 03/16/2015    Health Maintenance  Topic Date Due  . Hepatitis C Screening  Never done  . HIV Screening  Never done  . COLONOSCOPY  Never done  . INFLUENZA VACCINE  07/03/2020  . TETANUS/TDAP  03/15/2025  . COVID-19 Vaccine  Completed    Discussed health benefits of physical activity, and encouraged him to engage in regular exercise  appropriate for his age and condition. 1. Annual physical exam Patient willing to schedule colonoscopy.  Referral made.  2. Screening for prostate cancer  - POCT urinalysis dipstick - CBC with Differential/Platelet - Comprehensive metabolic panel - TSH - Lipid panel - PSA  3. Edema, peripheral Evidently secondary to side effect of chemotherapy. - POCT urinalysis dipstick - CBC with Differential/Platelet - Comprehensive metabolic panel - TSH - Lipid panel  4. Hyponatremia Follow labs. - POCT urinalysis dipstick - CBC with Differential/Platelet - Comprehensive metabolic panel - TSH - Lipid panel  5. Thrombocytopenia ([facility])  - POCT urinalysis dipstick - CBC with Differential/Platelet - Comprehensive metabolic panel - TSH - Lipid panel  6. ED (erectile dysfunction) of organic origin Change to sildenafil to see if it helps him anymore.  Top Cialis. - tadalafil (CIALIS) 20 MG tablet; Take one tablet every 2 days as needed  Dispense: 50 tablet; Refill: 5  7. Pruritus Topical Caladryl alternating with topical hydrocortisone cream.  Consider Zyrtec and consider montelukast.    Return in about 1 year (around 04/27/2021) for CPE.     I, Wilhemena Durie, MD, have reviewed all documentation for this visit. The documentation on 05/01/20 for the exam, diagnosis, procedures, and orders are all accurate and complete.    Richard Cranford Mon, MD  [facility] [phone] (phone) [phone] (fax)  [place]

## 2020-04-27 ENCOUNTER — Other Ambulatory Visit: Payer: Self-pay

## 2020-04-27 ENCOUNTER — Ambulatory Visit (INDEPENDENT_AMBULATORY_CARE_PROVIDER_SITE_OTHER): Payer: BC Managed Care – PPO | Admitting: Family Medicine

## 2020-04-27 ENCOUNTER — Encounter: Payer: Self-pay | Admitting: Family Medicine

## 2020-04-27 VITALS — BP 125/80 | HR 74 | Temp 97.1°F | Ht 70.0 in | Wt 209.2 lb

## 2020-04-27 DIAGNOSIS — E871 Hypo-osmolality and hyponatremia: Secondary | ICD-10-CM

## 2020-04-27 DIAGNOSIS — Z0000 Encounter for general adult medical examination without abnormal findings: Secondary | ICD-10-CM | POA: Diagnosis not present

## 2020-04-27 DIAGNOSIS — Z125 Encounter for screening for malignant neoplasm of prostate: Secondary | ICD-10-CM | POA: Diagnosis not present

## 2020-04-27 DIAGNOSIS — R609 Edema, unspecified: Secondary | ICD-10-CM | POA: Diagnosis not present

## 2020-04-27 DIAGNOSIS — D696 Thrombocytopenia, unspecified: Secondary | ICD-10-CM

## 2020-04-27 DIAGNOSIS — L299 Pruritus, unspecified: Secondary | ICD-10-CM

## 2020-04-27 DIAGNOSIS — N529 Male erectile dysfunction, unspecified: Secondary | ICD-10-CM

## 2020-04-27 LAB — POCT URINALYSIS DIPSTICK
Bilirubin, UA: NEGATIVE
Blood, UA: NEGATIVE
Glucose, UA: NEGATIVE
Ketones, UA: NEGATIVE
Leukocytes, UA: NEGATIVE
Nitrite, UA: NEGATIVE
Protein, UA: NEGATIVE
Spec Grav, UA: 1.01 (ref 1.010–1.025)
Urobilinogen, UA: 0.2 E.U./dL
pH, UA: 6 (ref 5.0–8.0)

## 2020-04-27 MED ORDER — TADALAFIL 20 MG PO TABS: ORAL_TABLET | ORAL | 5 refills | Status: DC

## 2020-04-27 NOTE — Patient Instructions (Signed)
TRY TOPICAL CALADRYL FOR ITCHING!!!

## 2020-04-28 LAB — CBC WITH DIFFERENTIAL/PLATELET
Basophils Absolute: 0 10*3/uL (ref 0.0–0.2)
Basos: 1 %
EOS (ABSOLUTE): 0.1 10*3/uL (ref 0.0–0.4)
Eos: 4 %
Hematocrit: 41.3 % (ref 37.5–51.0)
Hemoglobin: 14.7 g/dL (ref 13.0–17.7)
Immature Grans (Abs): 0 10*3/uL (ref 0.0–0.1)
Immature Granulocytes: 1 %
Lymphocytes Absolute: 1.5 10*3/uL (ref 0.7–3.1)
Lymphs: 48 %
MCH: 33 pg (ref 26.6–33.0)
MCHC: 35.6 g/dL (ref 31.5–35.7)
MCV: 93 fL (ref 79–97)
Monocytes Absolute: 0.5 10*3/uL (ref 0.1–0.9)
Monocytes: 14 %
Neutrophils Absolute: 1 10*3/uL — ABNORMAL LOW (ref 1.4–7.0)
Neutrophils: 32 %
Platelets: 164 10*3/uL (ref 150–450)
RBC: 4.45 x10E6/uL (ref 4.14–5.80)
RDW: 13.2 % (ref 11.6–15.4)
WBC: 3.1 10*3/uL — ABNORMAL LOW (ref 3.4–10.8)

## 2020-04-28 LAB — LIPID PANEL
Chol/HDL Ratio: 6.2 ratio — ABNORMAL HIGH (ref 0.0–5.0)
Cholesterol, Total: 316 mg/dL — ABNORMAL HIGH (ref 100–199)
HDL: 51 mg/dL (ref >39)
LDL Chol Calc (NIH): 158 mg/dL — ABNORMAL HIGH (ref 0–99)
Triglycerides: 549 mg/dL — ABNORMAL HIGH (ref 0–149)
VLDL Cholesterol Cal: 107 mg/dL — ABNORMAL HIGH (ref 5–40)

## 2020-04-28 LAB — COMPREHENSIVE METABOLIC PANEL
ALT: 19 IU/L (ref 0–44)
AST: 26 IU/L (ref 0–40)
Albumin/Globulin Ratio: 1.8 (ref 1.2–2.2)
Albumin: 4.7 g/dL (ref 3.8–4.9)
Alkaline Phosphatase: 55 IU/L (ref 48–121)
BUN/Creatinine Ratio: 15 (ref 9–20)
BUN: 15 mg/dL (ref 6–24)
Bilirubin Total: 0.2 mg/dL (ref 0.0–1.2)
CO2: 24 mmol/L (ref 20–29)
Calcium: 9.9 mg/dL (ref 8.7–10.2)
Chloride: 98 mmol/L (ref 96–106)
Creatinine, Ser: 1.01 mg/dL (ref 0.76–1.27)
GFR calc Af Amer: 95 mL/min/{1.73_m2} (ref >59)
GFR calc non Af Amer: 82 mL/min/{1.73_m2} (ref >59)
Globulin, Total: 2.6 g/dL (ref 1.5–4.5)
Glucose: 115 mg/dL — ABNORMAL HIGH (ref 65–99)
Potassium: 3.9 mmol/L (ref 3.5–5.2)
Sodium: 137 mmol/L (ref 134–144)
Total Protein: 7.3 g/dL (ref 6.0–8.5)

## 2020-04-28 LAB — TSH: TSH: 2.21 u[IU]/mL (ref 0.450–4.500)

## 2020-04-28 LAB — PSA: Prostate Specific Ag, Serum: 2.4 ng/mL (ref 0.0–4.0)

## 2020-04-29 ENCOUNTER — Telehealth: Payer: Self-pay

## 2020-04-29 ENCOUNTER — Encounter: Payer: Self-pay | Admitting: Family Medicine

## 2020-04-29 NOTE — Telephone Encounter (Signed)
-----   Message from [name], MD sent at 04/28/2020 11:06 AM EDT ----- Labs stable.  Lipids moderately elevated.  Diet and exercise.  Will check lipids again in 6 months or so, if triglycerides still above 500 would consider treating with medication.

## 2020-04-29 NOTE — Telephone Encounter (Signed)
Patient given lab results through mychart and has read the providers comments.  

## 2020-05-03 ENCOUNTER — Other Ambulatory Visit: Payer: Self-pay

## 2020-05-03 ENCOUNTER — Ambulatory Visit
Admission: RE | Admit: 2020-05-03 | Discharge: 2020-05-03 | Disposition: A | Discharge status: Home / Self Care | Payer: BC Managed Care – PPO | Source: Ambulatory Visit | Attending: Internal Medicine | Admitting: Internal Medicine

## 2020-05-03 DIAGNOSIS — C3492 Malignant neoplasm of unspecified part of left bronchus or lung: Secondary | ICD-10-CM | POA: Diagnosis not present

## 2020-05-03 DIAGNOSIS — C3402 Malignant neoplasm of left main bronchus: Secondary | ICD-10-CM

## 2020-05-03 MED ORDER — SILDENAFIL CITRATE 100 MG PO TABS: 100.0000 mg | ORAL_TABLET | Freq: Every day | ORAL | 11 refills | Status: DC | PRN

## 2020-05-03 MED ORDER — IOHEXOL 300 MG/ML  SOLN
85.0000 mL | Freq: Once | INTRAMUSCULAR | Status: AC | PRN
  Administered 2020-05-03: 85 mL via INTRAVENOUS

## 2020-05-03 NOTE — Addendum Note (Signed)
Addended by: Eulas Post on: 05/03/2020 08:13 AM   Modules accepted: Orders

## 2020-05-06 ENCOUNTER — Encounter: Payer: Self-pay | Admitting: Internal Medicine

## 2020-05-06 ENCOUNTER — Other Ambulatory Visit: Payer: Self-pay

## 2020-05-06 ENCOUNTER — Inpatient Hospital Stay (HOSPITAL_BASED_OUTPATIENT_CLINIC_OR_DEPARTMENT_OTHER): Payer: BC Managed Care – PPO | Admitting: Internal Medicine

## 2020-05-06 ENCOUNTER — Inpatient Hospital Stay: Payer: BC Managed Care – PPO | Attending: Internal Medicine

## 2020-05-06 ENCOUNTER — Inpatient Hospital Stay: Payer: BC Managed Care – PPO

## 2020-05-06 DIAGNOSIS — C3402 Malignant neoplasm of left main bronchus: Secondary | ICD-10-CM

## 2020-05-06 DIAGNOSIS — Z5111 Encounter for antineoplastic chemotherapy: Secondary | ICD-10-CM | POA: Diagnosis not present

## 2020-05-06 DIAGNOSIS — L299 Pruritus, unspecified: Secondary | ICD-10-CM | POA: Diagnosis not present

## 2020-05-06 DIAGNOSIS — M7989 Other specified soft tissue disorders: Secondary | ICD-10-CM | POA: Diagnosis not present

## 2020-05-06 LAB — COMPREHENSIVE METABOLIC PANEL
ALT: 18 U/L (ref 0–44)
AST: 23 U/L (ref 15–41)
Albumin: 4.2 g/dL (ref 3.5–5.0)
Alkaline Phosphatase: 43 U/L (ref 38–126)
Anion gap: 10 (ref 5–15)
BUN: 24 mg/dL — ABNORMAL HIGH (ref 6–20)
CO2: 27 mmol/L (ref 22–32)
Calcium: 9.1 mg/dL (ref 8.9–10.3)
Chloride: 98 mmol/L (ref 98–111)
Creatinine, Ser: 1.2 mg/dL (ref 0.61–1.24)
GFR calc Af Amer: >60 mL/min (ref >60)
GFR calc non Af Amer: >60 mL/min (ref >60)
Glucose, Bld: 226 mg/dL — ABNORMAL HIGH (ref 70–99)
Potassium: 3.7 mmol/L (ref 3.5–5.1)
Sodium: 135 mmol/L (ref 135–145)
Total Bilirubin: 1.1 mg/dL (ref 0.3–1.2)
Total Protein: 7.4 g/dL (ref 6.5–8.1)

## 2020-05-06 LAB — CBC WITH DIFFERENTIAL/PLATELET
Abs Immature Granulocytes: 0.02 10*3/uL (ref 0.00–0.07)
Basophils Absolute: 0 10*3/uL (ref 0.0–0.1)
Basophils Relative: 1 %
Eosinophils Absolute: 0.1 10*3/uL (ref 0.0–0.5)
Eosinophils Relative: 3 %
HCT: 40.9 % (ref 39.0–52.0)
Hemoglobin: 14.1 g/dL (ref 13.0–17.0)
Immature Granulocytes: 0 %
Lymphocytes Relative: 33 %
Lymphs Abs: 1.8 10*3/uL (ref 0.7–4.0)
MCH: 32.4 pg (ref 26.0–34.0)
MCHC: 34.5 g/dL (ref 30.0–36.0)
MCV: 94 fL (ref 80.0–100.0)
Monocytes Absolute: 0.5 10*3/uL (ref 0.1–1.0)
Monocytes Relative: 10 %
Neutro Abs: 3 10*3/uL (ref 1.7–7.7)
Neutrophils Relative %: 53 %
Platelets: 154 10*3/uL (ref 150–400)
RBC: 4.35 MIL/uL (ref 4.22–5.81)
RDW: 12.9 % (ref 11.5–15.5)
WBC: 5.6 10*3/uL (ref 4.0–10.5)
nRBC: 0 % (ref 0.0–0.2)

## 2020-05-06 MED ORDER — SODIUM CHLORIDE 0.9 % IV SOLN
500.0000 mg/m2 | Freq: Once | INTRAVENOUS | Status: AC
  Administered 2020-05-06: 1100 mg via INTRAVENOUS
  Filled 2020-05-06: qty 40

## 2020-05-06 MED ORDER — CYANOCOBALAMIN 1000 MCG/ML IJ SOLN
1000.0000 ug | Freq: Once | INTRAMUSCULAR | Status: AC
  Administered 2020-05-06: 1000 ug via INTRAMUSCULAR
  Filled 2020-05-06: qty 1

## 2020-05-06 MED ORDER — DEXAMETHASONE SODIUM PHOSPHATE 10 MG/ML IJ SOLN
6.0000 mg | Freq: Once | INTRAMUSCULAR | Status: AC
  Administered 2020-05-06: 6 mg via INTRAVENOUS
  Filled 2020-05-06: qty 1

## 2020-05-06 MED ORDER — SODIUM CHLORIDE 0.9 % IV SOLN
Freq: Once | INTRAVENOUS | Status: AC
  Filled 2020-05-06: qty 250

## 2020-05-06 MED ORDER — HEPARIN SOD (PORK) LOCK FLUSH 100 UNIT/ML IV SOLN
500.0000 [IU] | Freq: Once | INTRAVENOUS | Status: DC | PRN
  Filled 2020-05-06: qty 5

## 2020-05-06 MED ORDER — HEPARIN SOD (PORK) LOCK FLUSH 100 UNIT/ML IV SOLN
500.0000 [IU] | Freq: Once | INTRAVENOUS | Status: AC
  Administered 2020-05-06: 500 [IU] via INTRAVENOUS
  Filled 2020-05-06: qty 5

## 2020-05-06 MED ORDER — SODIUM CHLORIDE 0.9% FLUSH
10.0000 mL | Freq: Once | INTRAVENOUS | Status: AC
  Administered 2020-05-06: 10 mL via INTRAVENOUS
  Filled 2020-05-06: qty 10

## 2020-05-06 MED ORDER — PROCHLORPERAZINE MALEATE 10 MG PO TABS
10.0000 mg | ORAL_TABLET | Freq: Once | ORAL | Status: AC
  Administered 2020-05-06: 10 mg via ORAL
  Filled 2020-05-06: qty 1

## 2020-05-06 NOTE — Progress Notes (Signed)
Per Dr. Rogue Bussing proceed with Vitamin B12 today as ordered.

## 2020-05-06 NOTE — Progress Notes (Signed)
[facility] [place] OFFICE PROGRESS NOTE  Patient Care Team: [name], MD as PCP - General [facility])  Cancer Staging No matching staging information was found for the patient.   Oncology History Overview Note  # 2012- METASTATIC ADENO CA of LEFT LUNG [acinar pattern] s/p MED LN Bx [NEG- EGFR/K-ras/ALK mutation];[name]; [name]; CT JULY  2016- NED; JAN 2018- NED;   # AUG 2020-clinical trial closed; continue maintenance Alimta  # Thoracic aortic aneurysm-4.0 cm in 2016; currently 4.6 cm.  s/p referral to Dr. Faith Rogue.; MRI in 6 months planned.  ----------------------------------------------------------    DIAGNOSIS: [name]  STAGE: 4       ;GOALS: Palliative  CURRENT/MOST RECENT THERAPY '[ ]'  Alimta maintenance    Cancer of hilus of left lung ([place])  07/20/2016 Initial Diagnosis   Cancer of hilus of left lung ([place])   12/11/2019 -  Chemotherapy   The patient had PEMEtrexed (ALIMTA) 1,100 mg in sodium chloride 0.9 % 100 mL chemo infusion, 500 mg/m2 = 1,100 mg, Intravenous,  Once, 8 of 11 cycles Administration: 1,100 mg (12/11/2019), 1,100 mg (01/22/2020), 1,100 mg (02/12/2020), 1,100 mg (03/25/2020), 1,100 mg (01/01/2020), 1,100 mg (03/04/2020), 1,100 mg (04/15/2020), 1,100 mg (05/06/2020)  for chemotherapy treatment.       INTERVAL HISTORY:  Mitchell Mosley 59 y.o.  male pleasant patient above history of metastatic adenocarcinoma the lung-NED currently on maintenance Alimta is here for follow-up/review results of the CT scan.  Patient denies any unusual shortness of breath or cough patient is physically active.  No nausea no vomiting no headaches.  Mild swelling in the legs.  Mild chronic arthritic pain in the joints.  Review of Systems  Constitutional: Negative for chills, diaphoresis, fever, malaise/fatigue and weight loss.  HENT: Negative  for nosebleeds and sore throat.   Eyes: Negative for double vision.  Respiratory: Negative for hemoptysis, shortness of breath and wheezing.   Cardiovascular: Positive for leg swelling. Negative for chest pain, palpitations and orthopnea.  Gastrointestinal: Negative for abdominal pain, blood in stool, constipation, diarrhea, heartburn, melena, nausea and vomiting.  Genitourinary: Negative for dysuria, frequency and urgency.  Musculoskeletal: Positive for joint pain.  Skin: Negative.  Negative for itching and rash.  Neurological: Negative for dizziness, tingling, focal weakness, weakness and headaches.  Endo/Heme/Allergies: Does not bruise/bleed easily.  Psychiatric/Behavioral: Negative for depression. The patient is not nervous/anxious and does not have insomnia.       PAST MEDICAL HISTORY :  Past Medical History:  Diagnosis Date  . Allergy   . Lung cancer ([place])   . Mild valvular heart disease Nov. 2014   per 2 D Echocardiogram done for persistent leg edema, monitored by cardiology  . Peripheral edema     PAST SURGICAL HISTORY :   Past Surgical History:  Procedure Laterality Date  . ELBOW SURGERY Right 2011  . Left anterior thoracotomy with biopsy  March 2012  . PORTACATH PLACEMENT  March 2012  . TONSILLECTOMY  as child    FAMILY HISTORY :   Family History  Problem Relation Age of Onset  . Leukemia Father   . Diabetes Mother     SOCIAL HISTORY:   Social History   Tobacco Use  . Smoking status: Never Smoker  . Smokeless tobacco: Never Used  Substance Use Topics  . Alcohol use: Yes    Alcohol/week: 0.0 standard drinks    Comment: "social drinker"   .  Drug use: No    ALLERGIES:  is allergic to penicillins.  MEDICATIONS:  Current Outpatient Medications  Medication Sig Dispense Refill  . folic acid (FOLVITE) 1 MG tablet TAKE 1 TABLET BY MOUTH EVERY DAY 90 tablet 3  . furosemide (LASIX) 20 MG tablet TAKE 1 TABLET (20 MG TOTAL) BY MOUTH DAILY AS NEEDED FOR SWELLING  90 tablet 1  . meloxicam (MOBIC) 7.5 MG tablet Take 7.5 mg by mouth daily.     . Multiple Vitamin (MULTIVITAMIN) capsule Take 1 capsule by mouth daily. Reported on 02/24/2016    . NON FORMULARY Relief Factor with vitamin D and Turmeric    . Omega-3 Fatty Acids (FISH OIL) 1000 MG CAPS Take 1 capsule by mouth daily. Reported on 02/24/2016    . senna (SENOKOT) 8.6 MG tablet Take 1 tablet by mouth daily. Take as needed around time of chemotherapy    . sildenafil (VIAGRA) 100 MG tablet Take 1 tablet (100 mg total) by mouth daily as needed for erectile dysfunction. 20 tablet 11  . spironolactone (ALDACTONE) 25 MG tablet TAKE 1 TABLET BY MOUTH EVERY DAY 90 tablet 3  . tadalafil (CIALIS) 20 MG tablet Take one tablet every 2 days as needed 50 tablet 5  . vitamin B-12 (CYANOCOBALAMIN) 1000 MCG tablet Take 1,000 mcg by mouth daily.     No current facility-administered medications for this visit.   Facility-Administered Medications Ordered in Other Visits  Medication Dose Route Frequency Provider Last Rate Last Admin  . sodium chloride 0.9 % injection 10 mL  10 mL Intracatheter PRN [name], MD   10 mL at 05/27/15 1543  . sodium chloride 0.9 % injection 10 mL  10 mL Intracatheter PRN [name], MD   10 mL at 06/17/15 1510  . sodium chloride 0.9 % injection 10 mL  10 mL Intravenous PRN [name], MD   10 mL at 07/08/15 1340    PHYSICAL EXAMINATION: ECOG PERFORMANCE STATUS: 0 - Asymptomatic  BP 135/78   Pulse 98   Temp (!) 97.2 F (36.2 C)   Resp 20   Wt 211 lb 9.6 oz (96 kg)   SpO2 100%   BMI 30.36 kg/m   Filed Weights   05/06/20 1408  Weight: 211 lb 9.6 oz (96 kg)    Physical Exam  Constitutional: He is oriented to person, place, and time and well-developed, well-nourished, and in no distress.  HENT:  Head: Normocephalic and atraumatic.  Mouth/Throat: Oropharynx is clear and moist. No oropharyngeal exudate.  Eyes: Pupils are equal, round, and reactive to light.   Cardiovascular: Normal rate and regular rhythm.  Pulmonary/Chest: No respiratory distress. He has no wheezes.  Abdominal: Soft. Bowel sounds are normal. He exhibits no distension and no mass. There is no abdominal tenderness. There is no rebound and no guarding.  Musculoskeletal:        General: Edema present. No tenderness. Normal range of motion.     Cervical back: Normal range of motion and neck supple.     Comments: Grade 1 swelling in the legs bilaterally.  Neurological: He is alert and oriented to person, place, and time.  Skin: Skin is warm.  Psychiatric: Affect normal.     LABORATORY DATA:  I have reviewed the data as listed    Component Value Date/Time   NA 135 05/06/2020 1353   NA 137 04/27/2020 1029   NA 138 12/10/2014 1341   K 3.7 05/06/2020 1353   K 3.6 12/10/2014 1341  CL 98 05/06/2020 1353   CL 102 12/10/2014 1341   CO2 27 05/06/2020 1353   CO2 30 12/10/2014 1341   GLUCOSE 226 (H) 05/06/2020 1353   GLUCOSE 160 (H) 12/10/2014 1341   BUN 24 (H) 05/06/2020 1353   BUN 15 04/27/2020 1029   BUN 21 (H) 12/10/2014 1341   CREATININE 1.20 05/06/2020 1353   CREATININE 1.10 03/25/2015 1453   CALCIUM 9.1 05/06/2020 1353   CALCIUM 8.4 (L) 12/10/2014 1341   PROT 7.4 05/06/2020 1353   PROT 7.3 04/27/2020 1029   PROT 6.6 12/10/2014 1341   ALBUMIN 4.2 05/06/2020 1353   ALBUMIN 4.7 04/27/2020 1029   ALBUMIN 3.4 12/10/2014 1341   AST 23 05/06/2020 1353   AST 17 12/10/2014 1341   ALT 18 05/06/2020 1353   ALT 23 12/10/2014 1341   ALKPHOS 43 05/06/2020 1353   ALKPHOS 50 12/10/2014 1341   BILITOT 1.1 05/06/2020 1353   BILITOT 0.2 04/27/2020 1029   BILITOT 0.5 12/10/2014 1341   GFRNONAA >60 05/06/2020 1353   GFRNONAA >60 03/25/2015 1453   GFRAA >60 05/06/2020 1353   GFRAA >60 03/25/2015 1453    No results found for: SPEP, UPEP  Lab Results  Component Value Date   WBC 5.6 05/06/2020   NEUTROABS 3.0 05/06/2020   HGB 14.1 05/06/2020   HCT 40.9 05/06/2020   MCV  94.0 05/06/2020   PLT 154 05/06/2020      Chemistry      Component Value Date/Time   NA 135 05/06/2020 1353   NA 137 04/27/2020 1029   NA 138 12/10/2014 1341   K 3.7 05/06/2020 1353   K 3.6 12/10/2014 1341   CL 98 05/06/2020 1353   CL 102 12/10/2014 1341   CO2 27 05/06/2020 1353   CO2 30 12/10/2014 1341   BUN 24 (H) 05/06/2020 1353   BUN 15 04/27/2020 1029   BUN 21 (H) 12/10/2014 1341   CREATININE 1.20 05/06/2020 1353   CREATININE 1.10 03/25/2015 1453      Component Value Date/Time   CALCIUM 9.1 05/06/2020 1353   CALCIUM 8.4 (L) 12/10/2014 1341   ALKPHOS 43 05/06/2020 1353   ALKPHOS 50 12/10/2014 1341   AST 23 05/06/2020 1353   AST 17 12/10/2014 1341   ALT 18 05/06/2020 1353   ALT 23 12/10/2014 1341   BILITOT 1.1 05/06/2020 1353   BILITOT 0.2 04/27/2020 1029   BILITOT 0.5 12/10/2014 1341       RADIOGRAPHIC STUDIES: I have personally reviewed the radiological images as listed and agreed with the findings in the report. No results found.   ASSESSMENT & PLAN:  Cancer of hilus of left lung [facility]) # Adenocarcinoma the lung; stage IV; May 28th, 2021- CT scan chest/a/p-shows no evidence of metastatic disease; stable.   # Proceed with Alimta chemotherapy. Labs today reviewed;  acceptable for treatment today.  # Swelling in the hands/feet-grade 1 asymptomatic.? Alimta; monitor. STABLE.   #Thoracic aneurysm-approximately 4.5 cm in size; awaiting repeat evaluation with Dr. Faith Rogue next week.  #Port access malfunction-post TPA.  Improved.  # DISPOSITION:  # Alimta today # Follow up in 3 weeks MD-labs- cbc/cmp; alimta; Dr.B  # I reviewed the blood work- with the patient in detail; also reviewed the imaging independently [as summarized above]; and with the patient in detail.      Orders Placed This Encounter  Procedures  . CBC with Differential    Standing Status:   Future    Standing Expiration Date:  05/06/2021  . Basic metabolic panel    Standing Status:   Future     Standing Expiration Date:   05/06/2021   All questions were answered. The patient knows to call the clinic with any problems, questions or concerns.      Mitchell Sickle, MD 05/06/2020 9:04 PM

## 2020-05-06 NOTE — Assessment & Plan Note (Addendum)
#   Adenocarcinoma the lung; stage IV; May 28th, 2021- CT scan chest/a/p-shows no evidence of metastatic disease; stable.   # Proceed with Alimta chemotherapy. Labs today reviewed;  acceptable for treatment today.  # Swelling in the hands/feet-grade 1 asymptomatic.? Alimta; monitor. STABLE.   #Thoracic aneurysm-approximately 4.5 cm in size; awaiting repeat evaluation with Dr. Faith Rogue next week.  #Port access malfunction-post TPA.  Improved.  # DISPOSITION:  # Alimta today # Follow up in 3 weeks MD-labs- cbc/cmp; alimta; Dr.B  # I reviewed the blood work- with the patient in detail; also reviewed the imaging independently [as summarized above]; and with the patient in detail.

## 2020-05-09 ENCOUNTER — Encounter: Payer: Self-pay | Admitting: Cardiothoracic Surgery

## 2020-05-09 ENCOUNTER — Ambulatory Visit: Payer: BC Managed Care – PPO | Admitting: Cardiothoracic Surgery

## 2020-05-09 ENCOUNTER — Other Ambulatory Visit: Payer: Self-pay

## 2020-05-09 VITALS — BP 140/88 | HR 61 | Temp 97.7°F | Ht 70.0 in | Wt 212.0 lb

## 2020-05-09 DIAGNOSIS — I712 Thoracic aortic aneurysm, without rupture, unspecified: Secondary | ICD-10-CM

## 2020-05-09 NOTE — Progress Notes (Signed)
[name] Follow Up Note  Patient ID: Mitchell Mosley, male   DOB: February 07, 1961, 59 y.o.   MRN: [id number]  HISTORY: He comes in today for his biannual exam.  He has no new complaints.  He continues his maintenance chemotherapy every 3 weeks with Dr. Lynett Fish.  Chest CT made last week and I have independently reviewed that.  Denies any chest pain or shortness of breath.    Vitals:   05/09/20 1009  BP: 140/88  Pulse: 61  Temp: 97.7 F (36.5 C)  SpO2: 97%     EXAM:  Resp: Lungs are clear bilaterally.  No respiratory distress, normal effort. Heart:  Regular without murmurs Abd:  Abdomen is soft, non distended and non tender. No masses are palpable.  There is no rebound and no guarding.  Neurological: Alert and oriented to person, place, and time. Coordination normal.  Skin: Skin is warm and dry. No rash noted. No diaphoretic. No erythema. No pallor.  Psychiatric: Normal mood and affect. Normal behavior. Judgment and thought content normal.      ASSESSMENT: I have independently reviewed the chest CT.  There is no change in the ascending aortic aneurysm measuring approximately 4.5 cm.   PLAN:   I discussed with him the need for continued surveillance.  Because he requires imaging every 6 months for his underlying malignancy we would like to coordinate the evaluation of the aorta with the need for his oncologic evaluation.  I tentatively set him up for MRI of the chest in 6 months.  He certainly could have a CT scan as well.  He will discuss this with Dr. Lynett Fish and be back in touch.  I will see him in 6 months time.    Nestor Lewandowsky, MD

## 2020-05-09 NOTE — Patient Instructions (Addendum)
We will see you back in 6 months after imaging. This may be CT or MRI depending on what Dr Rogue Bussing says.   Please call and ask to speak with a nurse if you develop questions or concerns.

## 2020-05-25 ENCOUNTER — Telehealth: Payer: Self-pay | Admitting: *Deleted

## 2020-05-25 NOTE — Telephone Encounter (Signed)
Colette please r/s patient's chemotherapy - lab/md/alimita in 3 weeks. Cnl apts on Friday. Please inform patient of new apts.

## 2020-05-25 NOTE — Telephone Encounter (Signed)
Patient called reporting that he has to cancel his appointment Friday due to having to go out of town on an emergency. He is asking if Dr B thinks it would be alright to just skip this appointment and come back in 3 weeks. He said that Dr B can call him or just send a MyChart message to him about this. He does not have any other appts schdeduled beyond the one foe this Friday so one will need to be scheduled.

## 2020-05-27 ENCOUNTER — Inpatient Hospital Stay: Payer: BC Managed Care – PPO

## 2020-05-27 ENCOUNTER — Inpatient Hospital Stay: Payer: BC Managed Care – PPO | Admitting: Internal Medicine

## 2020-05-30 DIAGNOSIS — H7201 Central perforation of tympanic membrane, right ear: Secondary | ICD-10-CM | POA: Diagnosis not present

## 2020-06-17 ENCOUNTER — Telehealth: Payer: Self-pay | Admitting: *Deleted

## 2020-06-17 ENCOUNTER — Inpatient Hospital Stay: Payer: BC Managed Care – PPO

## 2020-06-17 ENCOUNTER — Inpatient Hospital Stay: Payer: BC Managed Care – PPO | Admitting: Internal Medicine

## 2020-06-17 NOTE — Telephone Encounter (Signed)
Patient called to reschedule his chemotherapy due to the "community water exposure issue with ecoli". He would like to limit his risk factors of getting exposed. He would like to r/s for next week. DR. B made aware.

## 2020-06-22 ENCOUNTER — Inpatient Hospital Stay: Payer: BC Managed Care – PPO | Admitting: Internal Medicine

## 2020-06-22 ENCOUNTER — Inpatient Hospital Stay: Payer: BC Managed Care – PPO

## 2020-06-27 ENCOUNTER — Inpatient Hospital Stay: Payer: BC Managed Care – PPO | Admitting: Internal Medicine

## 2020-06-27 ENCOUNTER — Inpatient Hospital Stay: Payer: BC Managed Care – PPO

## 2020-07-04 ENCOUNTER — Ambulatory Visit: Payer: BC Managed Care – PPO

## 2020-07-04 ENCOUNTER — Ambulatory Visit: Payer: BC Managed Care – PPO | Admitting: Internal Medicine

## 2020-07-04 ENCOUNTER — Other Ambulatory Visit: Payer: BC Managed Care – PPO

## 2020-07-08 ENCOUNTER — Inpatient Hospital Stay: Payer: BC Managed Care – PPO

## 2020-07-08 ENCOUNTER — Inpatient Hospital Stay: Payer: BC Managed Care – PPO | Attending: Internal Medicine | Admitting: Internal Medicine

## 2020-07-08 ENCOUNTER — Other Ambulatory Visit: Payer: Self-pay

## 2020-07-08 DIAGNOSIS — Z5111 Encounter for antineoplastic chemotherapy: Secondary | ICD-10-CM | POA: Diagnosis not present

## 2020-07-08 DIAGNOSIS — Z806 Family history of leukemia: Secondary | ICD-10-CM | POA: Insufficient documentation

## 2020-07-08 DIAGNOSIS — C3402 Malignant neoplasm of left main bronchus: Secondary | ICD-10-CM | POA: Insufficient documentation

## 2020-07-08 DIAGNOSIS — L299 Pruritus, unspecified: Secondary | ICD-10-CM | POA: Diagnosis not present

## 2020-07-08 DIAGNOSIS — I712 Thoracic aortic aneurysm, without rupture: Secondary | ICD-10-CM | POA: Insufficient documentation

## 2020-07-08 DIAGNOSIS — M7989 Other specified soft tissue disorders: Secondary | ICD-10-CM | POA: Insufficient documentation

## 2020-07-08 LAB — COMPREHENSIVE METABOLIC PANEL
ALT: 19 U/L (ref 0–44)
AST: 30 U/L (ref 15–41)
Albumin: 4.2 g/dL (ref 3.5–5.0)
Alkaline Phosphatase: 42 U/L (ref 38–126)
Anion gap: 9 (ref 5–15)
BUN: 17 mg/dL (ref 6–20)
CO2: 28 mmol/L (ref 22–32)
Calcium: 9.2 mg/dL (ref 8.9–10.3)
Chloride: 99 mmol/L (ref 98–111)
Creatinine, Ser: 1.13 mg/dL (ref 0.61–1.24)
GFR calc Af Amer: >60 mL/min (ref >60)
GFR calc non Af Amer: >60 mL/min (ref >60)
Glucose, Bld: 161 mg/dL — ABNORMAL HIGH (ref 70–99)
Potassium: 4.1 mmol/L (ref 3.5–5.1)
Sodium: 136 mmol/L (ref 135–145)
Total Bilirubin: 1.1 mg/dL (ref 0.3–1.2)
Total Protein: 7.7 g/dL (ref 6.5–8.1)

## 2020-07-08 LAB — CBC WITH DIFFERENTIAL/PLATELET
Abs Immature Granulocytes: 0.01 10*3/uL (ref 0.00–0.07)
Basophils Absolute: 0 10*3/uL (ref 0.0–0.1)
Basophils Relative: 1 %
Eosinophils Absolute: 0.1 10*3/uL (ref 0.0–0.5)
Eosinophils Relative: 1 %
HCT: 42.9 % (ref 39.0–52.0)
Hemoglobin: 15 g/dL (ref 13.0–17.0)
Immature Granulocytes: 0 %
Lymphocytes Relative: 27 %
Lymphs Abs: 1.4 10*3/uL (ref 0.7–4.0)
MCH: 32.4 pg (ref 26.0–34.0)
MCHC: 35 g/dL (ref 30.0–36.0)
MCV: 92.7 fL (ref 80.0–100.0)
Monocytes Absolute: 0.4 10*3/uL (ref 0.1–1.0)
Monocytes Relative: 8 %
Neutro Abs: 3.3 10*3/uL (ref 1.7–7.7)
Neutrophils Relative %: 63 %
Platelets: 148 10*3/uL — ABNORMAL LOW (ref 150–400)
RBC: 4.63 MIL/uL (ref 4.22–5.81)
RDW: 12.1 % (ref 11.5–15.5)
WBC: 5.3 10*3/uL (ref 4.0–10.5)
nRBC: 0 % (ref 0.0–0.2)

## 2020-07-08 MED ORDER — HEPARIN SOD (PORK) LOCK FLUSH 100 UNIT/ML IV SOLN
INTRAVENOUS | Status: AC
  Filled 2020-07-08: qty 5

## 2020-07-08 MED ORDER — PROCHLORPERAZINE MALEATE 10 MG PO TABS
10.0000 mg | ORAL_TABLET | Freq: Once | ORAL | Status: AC
  Administered 2020-07-08: 10 mg via ORAL
  Filled 2020-07-08: qty 1

## 2020-07-08 MED ORDER — SODIUM CHLORIDE 0.9 % IV SOLN
500.0000 mg/m2 | Freq: Once | INTRAVENOUS | Status: AC
  Administered 2020-07-08: 1100 mg via INTRAVENOUS
  Filled 2020-07-08: qty 40

## 2020-07-08 MED ORDER — HEPARIN SOD (PORK) LOCK FLUSH 100 UNIT/ML IV SOLN
500.0000 [IU] | Freq: Once | INTRAVENOUS | Status: AC | PRN
  Administered 2020-07-08: 500 [IU]
  Filled 2020-07-08: qty 5

## 2020-07-08 MED ORDER — SODIUM CHLORIDE 0.9 % IV SOLN
Freq: Once | INTRAVENOUS | Status: AC
  Filled 2020-07-08: qty 250

## 2020-07-08 MED ORDER — CYANOCOBALAMIN 1000 MCG/ML IJ SOLN
1000.0000 ug | Freq: Once | INTRAMUSCULAR | Status: AC
  Administered 2020-07-08: 1000 ug via INTRAMUSCULAR
  Filled 2020-07-08: qty 1

## 2020-07-08 MED ORDER — DEXAMETHASONE SODIUM PHOSPHATE 10 MG/ML IJ SOLN
6.0000 mg | Freq: Once | INTRAMUSCULAR | Status: AC
  Administered 2020-07-08: 6 mg via INTRAVENOUS
  Filled 2020-07-08: qty 1

## 2020-07-08 NOTE — Progress Notes (Signed)
[place] OFFICE PROGRESS NOTE  Patient Care Team: [name], MD as PCP - General [facility])  Cancer Staging No matching staging information was found for the patient.   Oncology History Overview Note  # 2012- METASTATIC ADENO CA of LEFT LUNG [acinar pattern] s/p MED LN Bx [NEG- EGFR/K-ras/ALK mutation];[name]; [name]; CT JULY  2016- NED; JAN 2018- NED;   # AUG 2020-clinical trial closed; continue maintenance Alimta  # Thoracic aortic aneurysm-4.0 cm in 2016; currently 4.6 cm.  s/p referral to Dr. Faith Rogue.; MRI in 6 months planned.  ----------------------------------------------------------    DIAGNOSIS: [name]  STAGE: 4       ;GOALS: Palliative  CURRENT/MOST RECENT THERAPY _0  Alimta maintenance    Cancer of hilus of left lung ([place])  07/20/2016 Initial Diagnosis   Cancer of hilus of left lung ([place])   12/11/2019 -  Chemotherapy   The patient had dexamethasone (DECADRON) 4 MG tablet, 1 of 1 cycle, Start date: --, End date: -- PEMEtrexed (ALIMTA) 1,100 mg in sodium chloride 0.9 % 100 mL chemo infusion, 500 mg/m2 = 1,100 mg, Intravenous,  Once, 8 of 11 cycles Administration: 1,100 mg (12/11/2019), 1,100 mg (01/22/2020), 1,100 mg (02/12/2020), 1,100 mg (03/25/2020), 1,100 mg (01/01/2020), 1,100 mg (03/04/2020), 1,100 mg (04/15/2020), 1,100 mg (05/06/2020)  for chemotherapy treatment.       INTERVAL HISTORY:  Mitchell Mosley 59 y.o.  male pleasant patient above history of metastatic adenocarcinoma the lung-NED currently on maintenance Alimta is here for follow-up.  Patient denies any worsening shortness of breath or cough.  Continues to be very physically active.  No headaches no nausea no vomiting.  Chronic mild joint pains..  Chronic mild swelling in legs.  Mild itching palms and soles with no obvious skin rash.   Review of  Systems  Constitutional: Negative for chills, diaphoresis, fever, malaise/fatigue and weight loss.  HENT: Negative for nosebleeds and sore throat.   Eyes: Negative for double vision.  Respiratory: Negative for hemoptysis, shortness of breath and wheezing.   Cardiovascular: Positive for leg swelling. Negative for chest pain, palpitations and orthopnea.  Gastrointestinal: Negative for abdominal pain, blood in stool, constipation, diarrhea, heartburn, melena, nausea and vomiting.  Genitourinary: Negative for dysuria, frequency and urgency.  Musculoskeletal: Positive for joint pain.  Skin: Negative.  Negative for itching and rash.  Neurological: Negative for dizziness, tingling, focal weakness, weakness and headaches.  Endo/Heme/Allergies: Does not bruise/bleed easily.  Psychiatric/Behavioral: Negative for depression. The patient is not nervous/anxious and does not have insomnia.       PAST MEDICAL HISTORY :  Past Medical History:  Diagnosis Date  . Allergy   . Lung cancer ([place])   . Mild valvular heart disease Nov. 2014   per 2 D Echocardiogram done for persistent leg edema, monitored by cardiology  . Peripheral edema     PAST SURGICAL HISTORY :   Past Surgical History:  Procedure Laterality Date  . ELBOW SURGERY Right 2011  . Left anterior thoracotomy with biopsy  March 2012  . PORTACATH PLACEMENT  March 2012  . TONSILLECTOMY  as child    FAMILY HISTORY :   Family History  Problem Relation Age of Onset  . Leukemia Father   . Diabetes Mother     SOCIAL HISTORY:   Social History   Tobacco Use  . Smoking status: Never Smoker  . Smokeless tobacco: Never Used  Substance Use Topics  . Alcohol use: Yes    Alcohol/week: 0.0 standard drinks    Comment: "social drinker"   . Drug use: No    ALLERGIES:  is allergic to penicillins.  MEDICATIONS:  Current Outpatient Medications  Medication Sig Dispense Refill  . folic acid (FOLVITE) 1 MG tablet TAKE 1 TABLET BY MOUTH EVERY  DAY 90 tablet 3  . furosemide (LASIX) 20 MG tablet TAKE 1 TABLET (20 MG TOTAL) BY MOUTH DAILY AS NEEDED FOR SWELLING 90 tablet 1  . meloxicam (MOBIC) 7.5 MG tablet Take 7.5 mg by mouth daily.     . Multiple Vitamin (MULTIVITAMIN) capsule Take 1 capsule by mouth daily. Reported on 02/24/2016    . NON FORMULARY Relief Factor with vitamin D and Turmeric    . Omega-3 Fatty Acids (FISH OIL) 1000 MG CAPS Take 1 capsule by mouth daily. Reported on 02/24/2016    . senna (SENOKOT) 8.6 MG tablet Take 1 tablet by mouth daily. Take as needed around time of chemotherapy    . sildenafil (VIAGRA) 100 MG tablet Take 1 tablet (100 mg total) by mouth daily as needed for erectile dysfunction. 20 tablet 11  . spironolactone (ALDACTONE) 25 MG tablet TAKE 1 TABLET BY MOUTH EVERY DAY 90 tablet 3  . tadalafil (CIALIS) 20 MG tablet Take one tablet every 2 days as needed 50 tablet 5  . vitamin B-12 (CYANOCOBALAMIN) 1000 MCG tablet Take 1,000 mcg by mouth daily.     No current facility-administered medications for this visit.   Facility-Administered Medications Ordered in Other Visits  Medication Dose Route Frequency Provider Last Rate Last Admin  . 0.9 %  sodium chloride infusion   Intravenous Once Charlaine Dalton R, MD      . cyanocobalamin ((VITAMIN B-12)) injection 1,000 mcg  1,000 mcg Intramuscular Once Charlaine Dalton R, MD      . dexamethasone (DECADRON) injection 6 mg  6 mg Intravenous Once Charlaine Dalton R, MD      . prochlorperazine (COMPAZINE) tablet 10 mg  10 mg Oral Once Charlaine Dalton R, [name], MD   10 mL at 05/27/15 1543  . sodium chloride 0.9 % injection 10 mL  10 mL Intracatheter PRN [name], MD   10 mL at 06/17/15 1510  . sodium chloride 0.9 % injection 10 mL  10 mL Intravenous PRN [name], MD   10 mL at 07/08/15 1340    PHYSICAL EXAMINATION: ECOG PERFORMANCE STATUS: 0 -  Asymptomatic  BP 140/87   Pulse 79   Temp 97.6 F (36.4 C) (Tympanic)   Resp 20   Ht _0  (1.778 m)   Wt 210 lb (95.3 kg)   BMI 30.13 kg/m   Filed Weights   07/08/20 1049  Weight: 210 lb (95.3 kg)    Physical Exam HENT:     Head: Normocephalic and atraumatic.     Mouth/Throat:     Pharynx: No oropharyngeal exudate.  Eyes:     Pupils: Pupils are equal, round, and reactive to light.  Cardiovascular:     Rate and Rhythm: Normal rate and regular rhythm.  Pulmonary:     Effort: No respiratory distress.     Breath sounds: No wheezing.  Abdominal:     General: Bowel sounds are normal. There is no distension.     Palpations: Abdomen is soft. There is no mass.  Tenderness: There is no abdominal tenderness. There is no guarding or rebound.  Musculoskeletal:        General: No tenderness. Normal range of motion.     Cervical back: Normal range of motion and neck supple.     Comments: Grade 1 swelling in the legs bilaterally.  Skin:    General: Skin is warm.  Neurological:     Mental Status: He is alert and oriented to person, place, and time.  Psychiatric:        Mood and Affect: Affect normal.      LABORATORY DATA:  I have reviewed the data as listed    Component Value Date/Time   NA 136 07/08/2020 1024   NA 137 04/27/2020 1029   NA 138 12/10/2014 1341   K 4.1 07/08/2020 1024   K 3.6 12/10/2014 1341   CL 99 07/08/2020 1024   CL 102 12/10/2014 1341   CO2 28 07/08/2020 1024   CO2 30 12/10/2014 1341   GLUCOSE 161 (H) 07/08/2020 1024   GLUCOSE 160 (H) 12/10/2014 1341   BUN 17 07/08/2020 1024   BUN 15 04/27/2020 1029   BUN 21 (H) 12/10/2014 1341   CREATININE 1.13 07/08/2020 1024   CREATININE 1.10 03/25/2015 1453   CALCIUM 9.2 07/08/2020 1024   CALCIUM 8.4 (L) 12/10/2014 1341   PROT 7.7 07/08/2020 1024   PROT 7.3 04/27/2020 1029   PROT 6.6 12/10/2014 1341   ALBUMIN 4.2 07/08/2020 1024   ALBUMIN 4.7 04/27/2020 1029   ALBUMIN 3.4 12/10/2014 1341   AST 30  07/08/2020 1024   AST 17 12/10/2014 1341   ALT 19 07/08/2020 1024   ALT 23 12/10/2014 1341   ALKPHOS 42 07/08/2020 1024   ALKPHOS 50 12/10/2014 1341   BILITOT 1.1 07/08/2020 1024   BILITOT 0.2 04/27/2020 1029   BILITOT 0.5 12/10/2014 1341   GFRNONAA >60 07/08/2020 1024   GFRNONAA >60 03/25/2015 1453   GFRAA >60 07/08/2020 1024   GFRAA >60 03/25/2015 1453    No results found for: SPEP, UPEP  Lab Results  Component Value Date   WBC 5.3 07/08/2020   NEUTROABS 3.3 07/08/2020   HGB 15.0 07/08/2020   HCT 42.9 07/08/2020   MCV 92.7 07/08/2020   PLT 148 (L) 07/08/2020      Chemistry      Component Value Date/Time   NA 136 07/08/2020 1024   NA 137 04/27/2020 1029   NA 138 12/10/2014 1341   K 4.1 07/08/2020 1024   K 3.6 12/10/2014 1341   CL 99 07/08/2020 1024   CL 102 12/10/2014 1341   CO2 28 07/08/2020 1024   CO2 30 12/10/2014 1341   BUN 17 07/08/2020 1024   BUN 15 04/27/2020 1029   BUN 21 (H) 12/10/2014 1341   CREATININE 1.13 07/08/2020 1024   CREATININE 1.10 03/25/2015 1453      Component Value Date/Time   CALCIUM 9.2 07/08/2020 1024   CALCIUM 8.4 (L) 12/10/2014 1341   ALKPHOS 42 07/08/2020 1024   ALKPHOS 50 12/10/2014 1341   AST 30 07/08/2020 1024   AST 17 12/10/2014 1341   ALT 19 07/08/2020 1024   ALT 23 12/10/2014 1341   BILITOT 1.1 07/08/2020 1024   BILITOT 0.2 04/27/2020 1029   BILITOT 0.5 12/10/2014 1341       RADIOGRAPHIC STUDIES: I have personally reviewed the radiological images as listed and agreed with the findings in the report. No results found.   ASSESSMENT & PLAN:  Cancer of hilus  of left lung [facility]) # Adenocarcinoma the lung; stage IV; June 1st 2021- CT scan chest/a/p-shows no evidence of metastatic disease-stable.  # Proceed with Alimta chemotherapy. Labs today reviewed;  acceptable for treatment today.  # Swelling in the hands/feet-grade 1 asymptomatic.? Alimta; monitor.  Stable  #Thoracic aneurysm-approximately 4.5 cm in size; on  surveillance; stable reviewed the note from Dr. Faith Rogue.  # Itching palms and soles-question etiology.  Recommend anti-histamine prn.   # DISPOSITION:  # Alimta today # Follow up in 3 weeks MD-labs- cbc/cmp; alimta; Dr.B     No orders of the defined types were placed in this encounter.  All questions were answered. The patient knows to call the clinic with any problems, questions or concerns.      [name], MD 07/08/2020 11:27 AM

## 2020-07-08 NOTE — Assessment & Plan Note (Addendum)
#   Adenocarcinoma the lung; stage IV; June 1st 2021- CT scan chest/a/p-shows no evidence of metastatic disease-stable.  # Proceed with Alimta chemotherapy. Labs today reviewed;  acceptable for treatment today.  # Swelling in the hands/feet-grade 1 asymptomatic.? Alimta; monitor.  Stable  #Thoracic aneurysm-approximately 4.5 cm in size; on surveillance; stable reviewed the note from Dr. Faith Rogue.  # Itching palms and soles-question etiology.  Recommend anti-histamine prn.   # DISPOSITION:  # Alimta today # Follow up in 3 weeks MD-labs- cbc/cmp; alimta; Dr.B

## 2020-07-28 ENCOUNTER — Other Ambulatory Visit: Payer: Self-pay

## 2020-07-28 ENCOUNTER — Encounter: Payer: Self-pay | Admitting: Internal Medicine

## 2020-07-29 ENCOUNTER — Inpatient Hospital Stay (HOSPITAL_BASED_OUTPATIENT_CLINIC_OR_DEPARTMENT_OTHER): Payer: BC Managed Care – PPO | Admitting: Internal Medicine

## 2020-07-29 ENCOUNTER — Inpatient Hospital Stay: Payer: BC Managed Care – PPO

## 2020-07-29 ENCOUNTER — Encounter: Payer: Self-pay | Admitting: Internal Medicine

## 2020-07-29 DIAGNOSIS — C3402 Malignant neoplasm of left main bronchus: Secondary | ICD-10-CM | POA: Diagnosis not present

## 2020-07-29 DIAGNOSIS — Z5111 Encounter for antineoplastic chemotherapy: Secondary | ICD-10-CM

## 2020-07-29 DIAGNOSIS — Z806 Family history of leukemia: Secondary | ICD-10-CM | POA: Diagnosis not present

## 2020-07-29 DIAGNOSIS — M7989 Other specified soft tissue disorders: Secondary | ICD-10-CM | POA: Diagnosis not present

## 2020-07-29 DIAGNOSIS — I712 Thoracic aortic aneurysm, without rupture: Secondary | ICD-10-CM | POA: Diagnosis not present

## 2020-07-29 DIAGNOSIS — L299 Pruritus, unspecified: Secondary | ICD-10-CM | POA: Diagnosis not present

## 2020-07-29 LAB — CBC WITH DIFFERENTIAL/PLATELET
Abs Immature Granulocytes: 0.02 10*3/uL (ref 0.00–0.07)
Basophils Absolute: 0 10*3/uL (ref 0.0–0.1)
Basophils Relative: 1 %
Eosinophils Absolute: 0.1 10*3/uL (ref 0.0–0.5)
Eosinophils Relative: 2 %
HCT: 41 % (ref 39.0–52.0)
Hemoglobin: 14.6 g/dL (ref 13.0–17.0)
Immature Granulocytes: 0 %
Lymphocytes Relative: 30 %
Lymphs Abs: 1.6 10*3/uL (ref 0.7–4.0)
MCH: 32.6 pg (ref 26.0–34.0)
MCHC: 35.6 g/dL (ref 30.0–36.0)
MCV: 91.5 fL (ref 80.0–100.0)
Monocytes Absolute: 0.6 10*3/uL (ref 0.1–1.0)
Monocytes Relative: 10 %
Neutro Abs: 3 10*3/uL (ref 1.7–7.7)
Neutrophils Relative %: 57 %
Platelets: 194 10*3/uL (ref 150–400)
RBC: 4.48 MIL/uL (ref 4.22–5.81)
RDW: 12.2 % (ref 11.5–15.5)
WBC: 5.3 10*3/uL (ref 4.0–10.5)
nRBC: 0 % (ref 0.0–0.2)

## 2020-07-29 LAB — COMPREHENSIVE METABOLIC PANEL
ALT: 15 U/L (ref 0–44)
AST: 21 U/L (ref 15–41)
Albumin: 4 g/dL (ref 3.5–5.0)
Alkaline Phosphatase: 43 U/L (ref 38–126)
Anion gap: 8 (ref 5–15)
BUN: 21 mg/dL — ABNORMAL HIGH (ref 6–20)
CO2: 28 mmol/L (ref 22–32)
Calcium: 9.1 mg/dL (ref 8.9–10.3)
Chloride: 101 mmol/L (ref 98–111)
Creatinine, Ser: 1.05 mg/dL (ref 0.61–1.24)
GFR calc Af Amer: >60 mL/min (ref >60)
GFR calc non Af Amer: >60 mL/min (ref >60)
Glucose, Bld: 129 mg/dL — ABNORMAL HIGH (ref 70–99)
Potassium: 4.1 mmol/L (ref 3.5–5.1)
Sodium: 137 mmol/L (ref 135–145)
Total Bilirubin: 0.7 mg/dL (ref 0.3–1.2)
Total Protein: 7.4 g/dL (ref 6.5–8.1)

## 2020-07-29 MED ORDER — HEPARIN SOD (PORK) LOCK FLUSH 100 UNIT/ML IV SOLN
500.0000 [IU] | Freq: Once | INTRAVENOUS | Status: AC
  Filled 2020-07-29: qty 5

## 2020-07-29 MED ORDER — SODIUM CHLORIDE 0.9 % IV SOLN
500.0000 mg/m2 | Freq: Once | INTRAVENOUS | Status: AC
  Administered 2020-07-29: 1100 mg via INTRAVENOUS
  Filled 2020-07-29: qty 40

## 2020-07-29 MED ORDER — DEXAMETHASONE SODIUM PHOSPHATE 10 MG/ML IJ SOLN
6.0000 mg | Freq: Once | INTRAMUSCULAR | Status: AC
  Administered 2020-07-29: 6 mg via INTRAVENOUS
  Filled 2020-07-29: qty 1

## 2020-07-29 MED ORDER — SODIUM CHLORIDE 0.9 % IV SOLN
Freq: Once | INTRAVENOUS | Status: AC
  Filled 2020-07-29: qty 250

## 2020-07-29 MED ORDER — CYANOCOBALAMIN 1000 MCG/ML IJ SOLN
1000.0000 ug | Freq: Once | INTRAMUSCULAR | Status: AC
  Administered 2020-07-29: 1000 ug via INTRAMUSCULAR
  Filled 2020-07-29: qty 1

## 2020-07-29 MED ORDER — PROCHLORPERAZINE MALEATE 10 MG PO TABS
10.0000 mg | ORAL_TABLET | Freq: Once | ORAL | Status: AC
  Administered 2020-07-29: 10 mg via ORAL
  Filled 2020-07-29: qty 1

## 2020-07-29 MED ORDER — HEPARIN SOD (PORK) LOCK FLUSH 100 UNIT/ML IV SOLN
500.0000 [IU] | Freq: Once | INTRAVENOUS | Status: AC | PRN
  Administered 2020-07-29: 500 [IU]
  Filled 2020-07-29: qty 5

## 2020-07-29 MED ORDER — SODIUM CHLORIDE 0.9% FLUSH
10.0000 mL | Freq: Once | INTRAVENOUS | Status: AC
  Administered 2020-07-29: 10 mL via INTRAVENOUS
  Filled 2020-07-29: qty 10

## 2020-07-29 NOTE — Progress Notes (Signed)
[place] OFFICE PROGRESS NOTE  Patient Care Team: [name], MD as PCP - General [facility] Medicine)  Cancer Staging No matching staging information was found for the patient.   Oncology History Overview Note  # 2012- METASTATIC ADENO CA of LEFT LUNG [acinar pattern] s/p MED LN Bx [NEG- EGFR/K-ras/ALK mutation];[name]; [name]; CT JULY  2016- NED; JAN 2018- NED;   # AUG 2020-clinical trial closed; continue maintenance Alimta  # Thoracic aortic aneurysm-4.0 cm in 2016; currently 4.6 cm.  s/p referral to Dr. Faith Rogue.; MRI in 6 months planned.  ----------------------------------------------------------    DIAGNOSIS: [name]  STAGE: 4       ;GOALS: Palliative  CURRENT/MOST RECENT THERAPY '[ ]'  Alimta maintenance    Cancer of hilus of left lung ([place])  07/20/2016 Initial Diagnosis   Cancer of hilus of left lung ([place])   12/11/2019 -  Chemotherapy   The patient had dexamethasone (DECADRON) 4 MG tablet, 1 of 1 cycle, Start date: --, End date: -- PEMEtrexed (ALIMTA) 1,100 mg in sodium chloride 0.9 % 100 mL chemo infusion, 500 mg/m2 = 1,100 mg, Intravenous,  Once, 10 of 15 cycles Administration: 1,100 mg (12/11/2019), 1,100 mg (01/22/2020), 1,100 mg (02/12/2020), 1,100 mg (03/25/2020), 1,100 mg (01/01/2020), 1,100 mg (03/04/2020), 1,100 mg (04/15/2020), 1,100 mg (05/06/2020), 1,100 mg (07/08/2020), 1,100 mg (07/29/2020)  for chemotherapy treatment.       INTERVAL HISTORY:  Mitchell Mosley 59 y.o.  male pleasant patient above history of metastatic adenocarcinoma the lung-NED currently on maintenance Alimta is here for follow-up.  Patient denies any worsening shortness of breath or cough.  No headaches.  No nausea no vomiting.  Chronic mild joint pain swelling the legs.  Not any worse.   Review of Systems  Constitutional: Negative for chills, diaphoresis,  fever, malaise/fatigue and weight loss.  HENT: Negative for nosebleeds and sore throat.   Eyes: Negative for double vision.  Respiratory: Negative for hemoptysis, shortness of breath and wheezing.   Cardiovascular: Positive for leg swelling. Negative for chest pain, palpitations and orthopnea.  Gastrointestinal: Negative for abdominal pain, blood in stool, constipation, diarrhea, heartburn, melena, nausea and vomiting.  Genitourinary: Negative for dysuria, frequency and urgency.  Musculoskeletal: Positive for joint pain.  Skin: Negative.  Negative for itching and rash.  Neurological: Negative for dizziness, tingling, focal weakness, weakness and headaches.  Endo/Heme/Allergies: Does not bruise/bleed easily.  Psychiatric/Behavioral: Negative for depression. The patient is not nervous/anxious and does not have insomnia.       PAST MEDICAL HISTORY :  Past Medical History:  Diagnosis Date  . Allergy   . Lung cancer ([place])   . Mild valvular heart disease Nov. 2014   per 2 D Echocardiogram done for persistent leg edema, monitored by cardiology  . Peripheral edema     PAST SURGICAL HISTORY :   Past Surgical History:  Procedure Laterality Date  . ELBOW SURGERY Right 2011  . Left anterior thoracotomy with biopsy  March 2012  . PORTACATH PLACEMENT  March 2012  . TONSILLECTOMY  as child    FAMILY HISTORY :   Family History  Problem Relation Age of Onset  . Leukemia Father   . Diabetes Mother     SOCIAL HISTORY:   Social History   Tobacco Use  . Smoking status: Never Smoker  . Smokeless tobacco: Never Used  Substance Use Topics  . Alcohol use: Yes  Alcohol/week: 0.0 standard drinks    Comment: "social drinker"   . Drug use: No    ALLERGIES:  is allergic to penicillins.  MEDICATIONS:  Current Outpatient Medications  Medication Sig Dispense Refill  . folic acid (FOLVITE) 1 MG tablet TAKE 1 TABLET BY MOUTH EVERY DAY 90 tablet 3  . furosemide (LASIX) 20 MG tablet TAKE 1  TABLET (20 MG TOTAL) BY MOUTH DAILY AS NEEDED FOR SWELLING 90 tablet 1  . meloxicam (MOBIC) 7.5 MG tablet Take 7.5 mg by mouth daily.     . Multiple Vitamin (MULTIVITAMIN) capsule Take 1 capsule by mouth daily. Reported on 02/24/2016    . NON FORMULARY Relief Factor with vitamin D and Turmeric    . Omega-3 Fatty Acids (FISH OIL) 1000 MG CAPS Take 1 capsule by mouth daily. Reported on 02/24/2016    . senna (SENOKOT) 8.6 MG tablet Take 1 tablet by mouth daily. Take as needed around time of chemotherapy    . spironolactone (ALDACTONE) 25 MG tablet TAKE 1 TABLET BY MOUTH EVERY DAY 90 tablet 3  . vitamin B-12 (CYANOCOBALAMIN) 1000 MCG tablet Take 1,000 mcg by mouth daily.    . sildenafil (VIAGRA) 100 MG tablet Take 1 tablet (100 mg total) by mouth daily as needed for erectile dysfunction. (Patient not taking: Reported on 07/28/2020) 20 tablet 11  . tadalafil (CIALIS) 20 MG tablet Take one tablet every 2 days as needed (Patient not taking: Reported on 07/28/2020) 50 tablet 5   No current facility-administered medications for this visit.   Facility-Administered Medications Ordered in Other Visits  Medication Dose Route Frequency Provider Last Rate Last Admin  . heparin lock flush 100 unit/mL  500 Units Intravenous Once Charlaine Dalton R, [name], MD   10 mL at 05/27/15 1543  . sodium chloride 0.9 % injection 10 mL  10 mL Intracatheter PRN [name], MD   10 mL at 06/17/15 1510  . sodium chloride 0.9 % injection 10 mL  10 mL Intravenous PRN [name], MD   10 mL at 07/08/15 1340    PHYSICAL EXAMINATION: ECOG PERFORMANCE STATUS: 0 - Asymptomatic  BP 113/79 (BP Location: Left Arm, Patient Position: Sitting, Cuff Size: Large)   Pulse 75   Temp 97.9 F (36.6 C) (Tympanic)   Resp 16   Ht '5\' 10"'  (1.778 m)   Wt 212 lb (96.2 kg)   SpO2 100%   BMI 30.42 kg/m   Filed Weights   07/29/20 1356  Weight: 212 lb  (96.2 kg)    Physical Exam HENT:     Head: Normocephalic and atraumatic.     Mouth/Throat:     Pharynx: No oropharyngeal exudate.  Eyes:     Pupils: Pupils are equal, round, and reactive to light.  Cardiovascular:     Rate and Rhythm: Normal rate and regular rhythm.  Pulmonary:     Effort: No respiratory distress.     Breath sounds: No wheezing.  Abdominal:     General: Bowel sounds are normal. There is no distension.     Palpations: Abdomen is soft. There is no mass.     Tenderness: There is no abdominal tenderness. There is no guarding or rebound.  Musculoskeletal:        General: No tenderness. Normal range of motion.     Cervical back: Normal range of motion and neck supple.  Comments: Grade 1 swelling in the legs bilaterally.  Skin:    General: Skin is warm.  Neurological:     Mental Status: He is alert and oriented to person, place, and time.  Psychiatric:        Mood and Affect: Affect normal.      LABORATORY DATA:  I have reviewed the data as listed    Component Value Date/Time   NA 137 07/29/2020 1347   NA 137 04/27/2020 1029   NA 138 12/10/2014 1341   K 4.1 07/29/2020 1347   K 3.6 12/10/2014 1341   CL 101 07/29/2020 1347   CL 102 12/10/2014 1341   CO2 28 07/29/2020 1347   CO2 30 12/10/2014 1341   GLUCOSE 129 (H) 07/29/2020 1347   GLUCOSE 160 (H) 12/10/2014 1341   BUN 21 (H) 07/29/2020 1347   BUN 15 04/27/2020 1029   BUN 21 (H) 12/10/2014 1341   CREATININE 1.05 07/29/2020 1347   CREATININE 1.10 03/25/2015 1453   CALCIUM 9.1 07/29/2020 1347   CALCIUM 8.4 (L) 12/10/2014 1341   PROT 7.4 07/29/2020 1347   PROT 7.3 04/27/2020 1029   PROT 6.6 12/10/2014 1341   ALBUMIN 4.0 07/29/2020 1347   ALBUMIN 4.7 04/27/2020 1029   ALBUMIN 3.4 12/10/2014 1341   AST 21 07/29/2020 1347   AST 17 12/10/2014 1341   ALT 15 07/29/2020 1347   ALT 23 12/10/2014 1341   ALKPHOS 43 07/29/2020 1347   ALKPHOS 50 12/10/2014 1341   BILITOT 0.7 07/29/2020 1347   BILITOT  0.2 04/27/2020 1029   BILITOT 0.5 12/10/2014 1341   GFRNONAA >60 07/29/2020 1347   GFRNONAA >60 03/25/2015 1453   GFRAA >60 07/29/2020 1347   GFRAA >60 03/25/2015 1453    No results found for: SPEP, UPEP  Lab Results  Component Value Date   WBC 5.3 07/29/2020   NEUTROABS 3.0 07/29/2020   HGB 14.6 07/29/2020   HCT 41.0 07/29/2020   MCV 91.5 07/29/2020   PLT 194 07/29/2020      Chemistry      Component Value Date/Time   NA 137 07/29/2020 1347   NA 137 04/27/2020 1029   NA 138 12/10/2014 1341   K 4.1 07/29/2020 1347   K 3.6 12/10/2014 1341   CL 101 07/29/2020 1347   CL 102 12/10/2014 1341   CO2 28 07/29/2020 1347   CO2 30 12/10/2014 1341   BUN 21 (H) 07/29/2020 1347   BUN 15 04/27/2020 1029   BUN 21 (H) 12/10/2014 1341   CREATININE 1.05 07/29/2020 1347   CREATININE 1.10 03/25/2015 1453      Component Value Date/Time   CALCIUM 9.1 07/29/2020 1347   CALCIUM 8.4 (L) 12/10/2014 1341   ALKPHOS 43 07/29/2020 1347   ALKPHOS 50 12/10/2014 1341   AST 21 07/29/2020 1347   AST 17 12/10/2014 1341   ALT 15 07/29/2020 1347   ALT 23 12/10/2014 1341   BILITOT 0.7 07/29/2020 1347   BILITOT 0.2 04/27/2020 1029   BILITOT 0.5 12/10/2014 1341       RADIOGRAPHIC STUDIES: I have personally reviewed the radiological images as listed and agreed with the findings in the report. No results found.   ASSESSMENT & PLAN:  Cancer of hilus of left lung [facility]) # Adenocarcinoma the lung; stage IV; June 1st 2021- CT scan chest/a/p-shows no evidence of metastatic disease- STABLE.   # Proceed with Alimta chemotherapy. Labs today reviewed;  acceptable for treatment today.  # Swelling in the hands/feet-grade 1  asymptomatic.? Alimta; monitor; STABLE.   #Thoracic aneurysm-approximately 4.5 cm in size; on surveillance;STABLE.  # COVID BOOSTER: Discussed given patient's diagnosis and other comorbidities/therapies-patient would be considered immunocompromised.  As per CDC recommendation/FDA  approval-I would recommend booster vaccine.  Patient will inquire..   # DISPOSITION:  # Alimta today # Follow up in 3 weeks MD-labs- cbc/cmp; alimta; Dr.B     No orders of the defined types were placed in this encounter.  All questions were answered. The patient knows to call the clinic with any problems, questions or concerns.      [name], MD 07/29/2020 3:50 PM

## 2020-07-29 NOTE — Assessment & Plan Note (Addendum)
#   Adenocarcinoma the lung; stage IV; June 1st 2021- CT scan chest/a/p-shows no evidence of metastatic disease- STABLE.   # Proceed with Alimta chemotherapy. Labs today reviewed;  acceptable for treatment today.  # Swelling in the hands/feet-grade 1 asymptomatic.? Alimta; monitor; STABLE.   #Thoracic aneurysm-approximately 4.5 cm in size; on surveillance;STABLE.  # COVID BOOSTER: Discussed given patient's diagnosis and other comorbidities/therapies-patient would be considered immunocompromised.  As per CDC recommendation/FDA approval-I would recommend booster vaccine.  Patient will inquire..   # DISPOSITION:  # Alimta today # Follow up in 3 weeks MD-labs- cbc/cmp; alimta; Dr.B

## 2020-08-16 DIAGNOSIS — M62412 Contracture of muscle, left shoulder: Secondary | ICD-10-CM | POA: Diagnosis not present

## 2020-08-16 DIAGNOSIS — M9903 Segmental and somatic dysfunction of lumbar region: Secondary | ICD-10-CM | POA: Diagnosis not present

## 2020-08-16 DIAGNOSIS — R293 Abnormal posture: Secondary | ICD-10-CM | POA: Diagnosis not present

## 2020-08-16 DIAGNOSIS — M9907 Segmental and somatic dysfunction of upper extremity: Secondary | ICD-10-CM | POA: Diagnosis not present

## 2020-08-16 DIAGNOSIS — M9902 Segmental and somatic dysfunction of thoracic region: Secondary | ICD-10-CM | POA: Diagnosis not present

## 2020-08-16 DIAGNOSIS — M62830 Muscle spasm of back: Secondary | ICD-10-CM | POA: Diagnosis not present

## 2020-08-18 DIAGNOSIS — M9907 Segmental and somatic dysfunction of upper extremity: Secondary | ICD-10-CM | POA: Diagnosis not present

## 2020-08-18 DIAGNOSIS — M9903 Segmental and somatic dysfunction of lumbar region: Secondary | ICD-10-CM | POA: Diagnosis not present

## 2020-08-18 DIAGNOSIS — M62830 Muscle spasm of back: Secondary | ICD-10-CM | POA: Diagnosis not present

## 2020-08-18 DIAGNOSIS — R293 Abnormal posture: Secondary | ICD-10-CM | POA: Diagnosis not present

## 2020-08-18 DIAGNOSIS — M62412 Contracture of muscle, left shoulder: Secondary | ICD-10-CM | POA: Diagnosis not present

## 2020-08-18 DIAGNOSIS — M9902 Segmental and somatic dysfunction of thoracic region: Secondary | ICD-10-CM | POA: Diagnosis not present

## 2020-08-19 ENCOUNTER — Inpatient Hospital Stay: Payer: BC Managed Care – PPO

## 2020-08-19 ENCOUNTER — Inpatient Hospital Stay (HOSPITAL_BASED_OUTPATIENT_CLINIC_OR_DEPARTMENT_OTHER): Payer: BC Managed Care – PPO | Admitting: Internal Medicine

## 2020-08-19 ENCOUNTER — Encounter: Payer: Self-pay | Admitting: Internal Medicine

## 2020-08-19 ENCOUNTER — Inpatient Hospital Stay: Payer: BC Managed Care – PPO | Attending: Internal Medicine

## 2020-08-19 ENCOUNTER — Other Ambulatory Visit: Payer: Self-pay

## 2020-08-19 DIAGNOSIS — C3402 Malignant neoplasm of left main bronchus: Secondary | ICD-10-CM

## 2020-08-19 DIAGNOSIS — Z806 Family history of leukemia: Secondary | ICD-10-CM | POA: Insufficient documentation

## 2020-08-19 DIAGNOSIS — M7989 Other specified soft tissue disorders: Secondary | ICD-10-CM | POA: Insufficient documentation

## 2020-08-19 DIAGNOSIS — Z5111 Encounter for antineoplastic chemotherapy: Secondary | ICD-10-CM | POA: Insufficient documentation

## 2020-08-19 DIAGNOSIS — Z8679 Personal history of other diseases of the circulatory system: Secondary | ICD-10-CM | POA: Insufficient documentation

## 2020-08-19 LAB — COMPREHENSIVE METABOLIC PANEL
ALT: 18 U/L (ref 0–44)
AST: 21 U/L (ref 15–41)
Albumin: 4.2 g/dL (ref 3.5–5.0)
Alkaline Phosphatase: 46 U/L (ref 38–126)
Anion gap: 11 (ref 5–15)
BUN: 24 mg/dL — ABNORMAL HIGH (ref 6–20)
CO2: 24 mmol/L (ref 22–32)
Calcium: 9 mg/dL (ref 8.9–10.3)
Chloride: 102 mmol/L (ref 98–111)
Creatinine, Ser: 1.22 mg/dL (ref 0.61–1.24)
GFR calc Af Amer: >60 mL/min (ref >60)
GFR calc non Af Amer: >60 mL/min (ref >60)
Glucose, Bld: 162 mg/dL — ABNORMAL HIGH (ref 70–99)
Potassium: 3.6 mmol/L (ref 3.5–5.1)
Sodium: 137 mmol/L (ref 135–145)
Total Bilirubin: 0.9 mg/dL (ref 0.3–1.2)
Total Protein: 7.5 g/dL (ref 6.5–8.1)

## 2020-08-19 LAB — CBC WITH DIFFERENTIAL/PLATELET
Abs Immature Granulocytes: 0.03 10*3/uL (ref 0.00–0.07)
Basophils Absolute: 0.1 10*3/uL (ref 0.0–0.1)
Basophils Relative: 1 %
Eosinophils Absolute: 0.2 10*3/uL (ref 0.0–0.5)
Eosinophils Relative: 3 %
HCT: 41 % (ref 39.0–52.0)
Hemoglobin: 14.6 g/dL (ref 13.0–17.0)
Immature Granulocytes: 1 %
Lymphocytes Relative: 33 %
Lymphs Abs: 2.1 10*3/uL (ref 0.7–4.0)
MCH: 32.4 pg (ref 26.0–34.0)
MCHC: 35.6 g/dL (ref 30.0–36.0)
MCV: 91.1 fL (ref 80.0–100.0)
Monocytes Absolute: 0.6 10*3/uL (ref 0.1–1.0)
Monocytes Relative: 9 %
Neutro Abs: 3.5 10*3/uL (ref 1.7–7.7)
Neutrophils Relative %: 53 %
Platelets: 208 10*3/uL (ref 150–400)
RBC: 4.5 MIL/uL (ref 4.22–5.81)
RDW: 12.1 % (ref 11.5–15.5)
WBC: 6.4 10*3/uL (ref 4.0–10.5)
nRBC: 0 % (ref 0.0–0.2)

## 2020-08-19 MED ORDER — SODIUM CHLORIDE 0.9 % IV SOLN
Freq: Once | INTRAVENOUS | Status: AC
  Filled 2020-08-19: qty 250

## 2020-08-19 MED ORDER — DEXAMETHASONE SODIUM PHOSPHATE 10 MG/ML IJ SOLN
6.0000 mg | Freq: Once | INTRAMUSCULAR | Status: AC
  Administered 2020-08-19: 6 mg via INTRAVENOUS
  Filled 2020-08-19: qty 1

## 2020-08-19 MED ORDER — SODIUM CHLORIDE 0.9 % IV SOLN
500.0000 mg/m2 | Freq: Once | INTRAVENOUS | Status: AC
  Administered 2020-08-19: 1100 mg via INTRAVENOUS
  Filled 2020-08-19: qty 40

## 2020-08-19 MED ORDER — CYANOCOBALAMIN 1000 MCG/ML IJ SOLN
1000.0000 ug | Freq: Once | INTRAMUSCULAR | Status: AC
  Administered 2020-08-19: 1000 ug via INTRAMUSCULAR
  Filled 2020-08-19: qty 1

## 2020-08-19 MED ORDER — HEPARIN SOD (PORK) LOCK FLUSH 100 UNIT/ML IV SOLN
INTRAVENOUS | Status: AC
  Filled 2020-08-19: qty 5

## 2020-08-19 MED ORDER — SODIUM CHLORIDE 0.9% FLUSH
10.0000 mL | Freq: Once | INTRAVENOUS | Status: AC
  Administered 2020-08-19: 10 mL via INTRAVENOUS
  Filled 2020-08-19: qty 10

## 2020-08-19 MED ORDER — HEPARIN SOD (PORK) LOCK FLUSH 100 UNIT/ML IV SOLN
500.0000 [IU] | Freq: Once | INTRAVENOUS | Status: AC | PRN
  Administered 2020-08-19: 500 [IU]
  Filled 2020-08-19: qty 5

## 2020-08-19 MED ORDER — PROCHLORPERAZINE MALEATE 10 MG PO TABS
10.0000 mg | ORAL_TABLET | Freq: Once | ORAL | Status: AC
  Administered 2020-08-19: 10 mg via ORAL
  Filled 2020-08-19: qty 1

## 2020-08-19 NOTE — Assessment & Plan Note (Signed)
#   Adenocarcinoma the lung; stage IV; June 1st 2021- CT scan chest/a/p-shows no evidence of metastatic disease- STABLE.   # Proceed with Alimta chemotherapy. Labs today reviewed;  acceptable for treatment today.  # Swelling in the hands/feet-grade 1 asymptomatic.? Alimta; monitor; STABLE.   #Thoracic aneurysm-approximately 4.5 cm in size; on surveillance- STABLE.   # DISPOSITION:  # Alimta today # Follow up in 3 weeks MD-labs- cbc/cmp; alimta; Dr.B

## 2020-08-19 NOTE — Progress Notes (Signed)
[place] OFFICE PROGRESS NOTE  Patient Care Team: [name], MD as PCP - General [facility])  Cancer Staging No matching staging information was found for the patient.   Oncology History Overview Note  # 2012- METASTATIC ADENO CA of LEFT LUNG [acinar pattern] s/p MED LN Bx [NEG- EGFR/K-ras/ALK mutation];[name]; [name]; CT JULY  2016- NED; JAN 2018- NED;   # AUG 2020-clinical trial closed; continue maintenance Alimta  # Thoracic aortic aneurysm-4.0 cm in 2016; currently 4.6 cm.  s/p referral to Dr. Faith Rogue.; MRI in 6 months planned.  ----------------------------------------------------------    DIAGNOSIS: Mitchell.Mosley ] Adenocarcinoma lung  STAGE: 4       ;GOALS: Palliative  CURRENT/MOST RECENT THERAPY $RemoveBefore'[ ]'[id number]$  Alimta maintenance    Cancer of hilus of left lung ([place])  07/20/2016 Initial Diagnosis   Cancer of hilus of left lung ([place])   12/11/2019 -  Chemotherapy   The patient had dexamethasone (DECADRON) 4 MG tablet, 1 of 1 cycle, Start date: --, End date: -- PEMEtrexed (ALIMTA) 1,100 mg in sodium chloride 0.9 % 100 mL chemo infusion, 500 mg/m2 = 1,100 mg, Intravenous,  Once, 11 of 15 cycles Administration: 1,100 mg (12/11/2019), 1,100 mg (01/22/2020), 1,100 mg (02/12/2020), 1,100 mg (03/25/2020), 1,100 mg (01/01/2020), 1,100 mg (03/04/2020), 1,100 mg (04/15/2020), 1,100 mg (05/06/2020), 1,100 mg (07/08/2020), 1,100 mg (07/29/2020), 1,100 mg (08/19/2020)  for chemotherapy treatment.       INTERVAL HISTORY:  Mitchell Mosley 59 y.o.  male pleasant patient above history of metastatic adenocarcinoma the lung-NED currently on maintenance Alimta is here for follow-up.  Patient denies any headaches.  Denies nausea vomiting.  Chronic mild joint pains.  Not any worse.  Continues to be physically active.   Review of Systems  Constitutional: Negative for chills, diaphoresis,  fever, malaise/fatigue and weight loss.  HENT: Negative for nosebleeds and sore throat.   Eyes: Negative for double vision.  Respiratory: Negative for hemoptysis, shortness of breath and wheezing.   Cardiovascular: Positive for leg swelling. Negative for chest pain, palpitations and orthopnea.  Gastrointestinal: Negative for abdominal pain, blood in stool, constipation, diarrhea, heartburn, melena, nausea and vomiting.  Genitourinary: Negative for dysuria, frequency and urgency.  Musculoskeletal: Positive for joint pain.  Skin: Negative.  Negative for itching and rash.  Neurological: Negative for dizziness, tingling, focal weakness, weakness and headaches.  Endo/Heme/Allergies: Does not bruise/bleed easily.  Psychiatric/Behavioral: Negative for depression. The patient is not nervous/anxious and does not have insomnia.       PAST MEDICAL HISTORY :  Past Medical History:  Diagnosis Date  . Allergy   . Lung cancer ([place])   . Mild valvular heart disease Nov. 2014   per 2 D Echocardiogram done for persistent leg edema, monitored by cardiology  . Peripheral edema     PAST SURGICAL HISTORY :   Past Surgical History:  Procedure Laterality Date  . ELBOW SURGERY Right 2011  . Left anterior thoracotomy with biopsy  March 2012  . PORTACATH PLACEMENT  March 2012  . TONSILLECTOMY  as child    FAMILY HISTORY :   Family History  Problem Relation Age of Onset  . Leukemia Father   . Diabetes Mother     SOCIAL HISTORY:   Social History   Tobacco Use  . Smoking status: Never Smoker  . Smokeless tobacco: Never Used  Substance Use Topics  . Alcohol use: Yes    Alcohol/week:  0.0 standard drinks    Comment: "social drinker"   . Drug use: No    ALLERGIES:  is allergic to penicillins.  MEDICATIONS:  Current Outpatient Medications  Medication Sig Dispense Refill  . folic acid (FOLVITE) 1 MG tablet TAKE 1 TABLET BY MOUTH EVERY DAY 90 tablet 3  . furosemide (LASIX) 20 MG tablet TAKE 1  TABLET (20 MG TOTAL) BY MOUTH DAILY AS NEEDED FOR SWELLING 90 tablet 1  . meloxicam (MOBIC) 7.5 MG tablet Take 7.5 mg by mouth daily.     . Multiple Vitamin (MULTIVITAMIN) capsule Take 1 capsule by mouth daily. Reported on 02/24/2016    . NON FORMULARY Relief Factor with vitamin D and Turmeric    . Omega-3 Fatty Acids (FISH OIL) 1000 MG CAPS Take 1 capsule by mouth daily. Reported on 02/24/2016    . senna (SENOKOT) 8.6 MG tablet Take 1 tablet by mouth daily. Take as needed around time of chemotherapy    . spironolactone (ALDACTONE) 25 MG tablet TAKE 1 TABLET BY MOUTH EVERY DAY 90 tablet 3  . vitamin B-12 (CYANOCOBALAMIN) 1000 MCG tablet Take 1,000 mcg by mouth daily.    . sildenafil (VIAGRA) 100 MG tablet Take 1 tablet (100 mg total) by mouth daily as needed for erectile dysfunction. (Patient not taking: Reported on 07/28/2020) 20 tablet 11  . tadalafil (CIALIS) 20 MG tablet Take one tablet every 2 days as needed (Patient not taking: Reported on 07/28/2020) 50 tablet 5   No current facility-administered medications for this visit.   Facility-Administered Medications Ordered in Other Visits  Medication Dose Route Frequency Provider Last Rate Last Admin  . heparin lock flush 100 unit/mL  500 Units Intravenous Once Charlaine Dalton R, [name], MD   10 mL at 05/27/15 1543  . sodium chloride 0.9 % injection 10 mL  10 mL Intracatheter PRN [name], MD   10 mL at 06/17/15 1510  . sodium chloride 0.9 % injection 10 mL  10 mL Intravenous PRN [name], MD   10 mL at 07/08/15 1340    PHYSICAL EXAMINATION: ECOG PERFORMANCE STATUS: 0 - Asymptomatic  BP 122/83 (BP Location: Left Arm, Patient Position: Sitting, Cuff Size: Large)   Pulse 77   Temp (!) 97.3 F (36.3 C) (Tympanic)   Resp 16   Ht 5' 10" (1.778 m)   Wt 210 lb (95.3 kg)   SpO2 100%   BMI 30.13 kg/m   Filed Weights   08/19/20 1334  Weight: 210  lb (95.3 kg)    Physical Exam HENT:     Head: Normocephalic and atraumatic.     Mouth/Throat:     Pharynx: No oropharyngeal exudate.  Eyes:     Pupils: Pupils are equal, round, and reactive to light.  Cardiovascular:     Rate and Rhythm: Normal rate and regular rhythm.  Pulmonary:     Effort: No respiratory distress.     Breath sounds: No wheezing.  Abdominal:     General: Bowel sounds are normal. There is no distension.     Palpations: Abdomen is soft. There is no mass.     Tenderness: There is no abdominal tenderness. There is no guarding or rebound.  Musculoskeletal:        General: No tenderness. Normal range of motion.     Cervical back: Normal range of motion and neck supple.  Comments: Grade 1 swelling in the legs bilaterally.  Skin:    General: Skin is warm.  Neurological:     Mental Status: He is alert and oriented to person, place, and time.  Psychiatric:        Mood and Affect: Affect normal.      LABORATORY DATA:  I have reviewed the data as listed    Component Value Date/Time   NA 137 08/19/2020 1324   NA 137 04/27/2020 1029   NA 138 12/10/2014 1341   K 3.6 08/19/2020 1324   K 3.6 12/10/2014 1341   CL 102 08/19/2020 1324   CL 102 12/10/2014 1341   CO2 24 08/19/2020 1324   CO2 30 12/10/2014 1341   GLUCOSE 162 (H) 08/19/2020 1324   GLUCOSE 160 (H) 12/10/2014 1341   BUN 24 (H) 08/19/2020 1324   BUN 15 04/27/2020 1029   BUN 21 (H) 12/10/2014 1341   CREATININE 1.22 08/19/2020 1324   CREATININE 1.10 03/25/2015 1453   CALCIUM 9.0 08/19/2020 1324   CALCIUM 8.4 (L) 12/10/2014 1341   PROT 7.5 08/19/2020 1324   PROT 7.3 04/27/2020 1029   PROT 6.6 12/10/2014 1341   ALBUMIN 4.2 08/19/2020 1324   ALBUMIN 4.7 04/27/2020 1029   ALBUMIN 3.4 12/10/2014 1341   AST 21 08/19/2020 1324   AST 17 12/10/2014 1341   ALT 18 08/19/2020 1324   ALT 23 12/10/2014 1341   ALKPHOS 46 08/19/2020 1324   ALKPHOS 50 12/10/2014 1341   BILITOT 0.9 08/19/2020 1324   BILITOT  0.2 04/27/2020 1029   BILITOT 0.5 12/10/2014 1341   GFRNONAA >60 08/19/2020 1324   GFRNONAA >60 03/25/2015 1453   GFRAA >60 08/19/2020 1324   GFRAA >60 03/25/2015 1453    No results found for: SPEP, UPEP  Lab Results  Component Value Date   WBC 6.4 08/19/2020   NEUTROABS 3.5 08/19/2020   HGB 14.6 08/19/2020   HCT 41.0 08/19/2020   MCV 91.1 08/19/2020   PLT 208 08/19/2020      Chemistry      Component Value Date/Time   NA 137 08/19/2020 1324   NA 137 04/27/2020 1029   NA 138 12/10/2014 1341   K 3.6 08/19/2020 1324   K 3.6 12/10/2014 1341   CL 102 08/19/2020 1324   CL 102 12/10/2014 1341   CO2 24 08/19/2020 1324   CO2 30 12/10/2014 1341   BUN 24 (H) 08/19/2020 1324   BUN 15 04/27/2020 1029   BUN 21 (H) 12/10/2014 1341   CREATININE 1.22 08/19/2020 1324   CREATININE 1.10 03/25/2015 1453      Component Value Date/Time   CALCIUM 9.0 08/19/2020 1324   CALCIUM 8.4 (L) 12/10/2014 1341   ALKPHOS 46 08/19/2020 1324   ALKPHOS 50 12/10/2014 1341   AST 21 08/19/2020 1324   AST 17 12/10/2014 1341   ALT 18 08/19/2020 1324   ALT 23 12/10/2014 1341   BILITOT 0.9 08/19/2020 1324   BILITOT 0.2 04/27/2020 1029   BILITOT 0.5 12/10/2014 1341       RADIOGRAPHIC STUDIES: I have personally reviewed the radiological images as listed and agreed with the findings in the report. No results found.   ASSESSMENT & PLAN:  Cancer of hilus of left lung [facility]) # Adenocarcinoma the lung; stage IV; June 1st 2021- CT scan chest/a/p-shows no evidence of metastatic disease- STABLE.   # Proceed with Alimta chemotherapy. Labs today reviewed;  acceptable for treatment today.  # Swelling in the hands/feet-grade 1  asymptomatic.? Alimta; monitor; STABLE.   #Thoracic aneurysm-approximately 4.5 cm in size; on surveillance- STABLE.   # DISPOSITION:  # Alimta today # Follow up in 3 weeks MD-labs- cbc/cmp; alimta; Dr.B     Orders Placed This Encounter  Procedures  . CBC    Standing Status:    Future    Standing Expiration Date:   08/19/2021  . Comprehensive metabolic panel    Standing Status:   Future    Standing Expiration Date:   08/19/2021   All questions were answered. The patient knows to call the clinic with any problems, questions or concerns.      [name], MD 08/21/2020 6:20 PM

## 2020-08-22 DIAGNOSIS — R293 Abnormal posture: Secondary | ICD-10-CM | POA: Diagnosis not present

## 2020-08-22 DIAGNOSIS — M9903 Segmental and somatic dysfunction of lumbar region: Secondary | ICD-10-CM | POA: Diagnosis not present

## 2020-08-22 DIAGNOSIS — M9902 Segmental and somatic dysfunction of thoracic region: Secondary | ICD-10-CM | POA: Diagnosis not present

## 2020-08-22 DIAGNOSIS — M9907 Segmental and somatic dysfunction of upper extremity: Secondary | ICD-10-CM | POA: Diagnosis not present

## 2020-08-22 DIAGNOSIS — M62830 Muscle spasm of back: Secondary | ICD-10-CM | POA: Diagnosis not present

## 2020-08-22 DIAGNOSIS — M62412 Contracture of muscle, left shoulder: Secondary | ICD-10-CM | POA: Diagnosis not present

## 2020-08-24 DIAGNOSIS — M62830 Muscle spasm of back: Secondary | ICD-10-CM | POA: Diagnosis not present

## 2020-08-24 DIAGNOSIS — R293 Abnormal posture: Secondary | ICD-10-CM | POA: Diagnosis not present

## 2020-08-24 DIAGNOSIS — M9903 Segmental and somatic dysfunction of lumbar region: Secondary | ICD-10-CM | POA: Diagnosis not present

## 2020-08-24 DIAGNOSIS — M62412 Contracture of muscle, left shoulder: Secondary | ICD-10-CM | POA: Diagnosis not present

## 2020-08-24 DIAGNOSIS — M9902 Segmental and somatic dysfunction of thoracic region: Secondary | ICD-10-CM | POA: Diagnosis not present

## 2020-08-24 DIAGNOSIS — M9907 Segmental and somatic dysfunction of upper extremity: Secondary | ICD-10-CM | POA: Diagnosis not present

## 2020-08-31 ENCOUNTER — Other Ambulatory Visit: Payer: Self-pay | Admitting: Internal Medicine

## 2020-09-09 ENCOUNTER — Inpatient Hospital Stay: Payer: BC Managed Care – PPO

## 2020-09-09 ENCOUNTER — Telehealth: Payer: Self-pay | Admitting: Internal Medicine

## 2020-09-09 ENCOUNTER — Telehealth: Payer: Self-pay | Admitting: *Deleted

## 2020-09-09 ENCOUNTER — Inpatient Hospital Stay: Payer: BC Managed Care – PPO | Admitting: Internal Medicine

## 2020-09-09 NOTE — Telephone Encounter (Signed)
On 10/08-called patient to check on his recent message-that is awaiting Covid testing.  Patient clinically doing well except for mild cold; he is awaiting testing early next week.  He will keep [place] informed; will re-schedule chemotherapy appropriately.

## 2020-09-09 NOTE — Telephone Encounter (Signed)
Patient called stating he is not sure he should come in today. His wife has been sick for 4 days with cough and congestion and has been tested for COVID, but her results are not back yet and now he is getting congested. He asked if he should be tested and reschedule his appointment today. I recommended that he be tested and gave him number to call to schedule test. Please advise if his appointment should be rescheduled for today

## 2020-09-09 NOTE — Telephone Encounter (Signed)
rn unable to leave vm for patient. mychart msg sent

## 2020-09-09 NOTE — Telephone Encounter (Signed)
Per Dr. Rogue Bussing - Cancel apts today. Patient needs to be tested and then can call our office back with results. Then, Dr. B will reschedule the treatment.

## 2020-09-12 ENCOUNTER — Other Ambulatory Visit: Payer: BC Managed Care – PPO

## 2020-09-12 DIAGNOSIS — Z20822 Contact with and (suspected) exposure to covid-19: Secondary | ICD-10-CM | POA: Diagnosis not present

## 2020-09-13 ENCOUNTER — Telehealth (INDEPENDENT_AMBULATORY_CARE_PROVIDER_SITE_OTHER): Payer: BC Managed Care – PPO | Admitting: Physician Assistant

## 2020-09-13 ENCOUNTER — Encounter: Payer: Self-pay | Admitting: Physician Assistant

## 2020-09-13 DIAGNOSIS — J069 Acute upper respiratory infection, unspecified: Secondary | ICD-10-CM

## 2020-09-13 NOTE — Patient Instructions (Signed)
Sudafed Mucinex Ibuprofen or naproxen for inflammation or pain Allegra, zyrtec or claritin daily

## 2020-09-13 NOTE — Progress Notes (Signed)
MyChart Video Visit    Virtual Visit via Video Note   This visit type was conducted due to national recommendations for restrictions regarding the COVID-19 Pandemic (e.g. social distancing) in an effort to limit this patient's exposure and mitigate transmission in our community. This patient is at least at moderate risk for complications without adequate follow up. This format is felt to be most appropriate for this patient at this time. Physical exam was limited by quality of the video and audio technology used for the visit.   Patient location: Home Provider location: Office   I discussed the limitations of evaluation and management by telemedicine and the availability of in person appointments. The patient expressed understanding and agreed to proceed.  Patient: Mitchell Mosley   DOB: [birth year]   59 y.o. Male  MRN: [id number] Visit Date: 09/13/2020  Today's healthcare provider: Trinna Post, PA-C   Chief Complaint  Patient presents with  . Sinusitis   Subjective    Sinusitis This is a new problem. The current episode started in the past 7 days. The problem has been gradually worsening since onset. There has been no fever. Associated symptoms include congestion, sinus pressure and sneezing.    Patient with history of Stage IV Lung Cancer currently under palliative treatment presenting today with sinus pressure and headache ongoing for 5-6 days. He is taking sudafed sinus medication. Denies shortness of breath. Denies wheezing, coughing. She reports wife had similar symptoms. Wife was tested and was negative. Tested yesterday for COVID, results pending.     Medications: Outpatient Medications Prior to Visit  Medication Sig  . folic acid (FOLVITE) 1 MG tablet TAKE 1 TABLET BY MOUTH EVERY DAY  . furosemide (LASIX) 20 MG tablet TAKE 1 TABLET (20 MG TOTAL) BY MOUTH DAILY AS NEEDED FOR SWELLING  . meloxicam (MOBIC) 7.5 MG tablet Take 7.5 mg by mouth daily.   . Multiple  Vitamin (MULTIVITAMIN) capsule Take 1 capsule by mouth daily. Reported on 02/24/2016  . NON FORMULARY Relief Factor with vitamin D and Turmeric  . Omega-3 Fatty Acids (FISH OIL) 1000 MG CAPS Take 1 capsule by mouth daily. Reported on 02/24/2016  . senna (SENOKOT) 8.6 MG tablet Take 1 tablet by mouth daily. Take as needed around time of chemotherapy  . spironolactone (ALDACTONE) 25 MG tablet TAKE 1 TABLET BY MOUTH EVERY DAY  . vitamin B-12 (CYANOCOBALAMIN) 1000 MCG tablet Take 1,000 mcg by mouth daily.  . sildenafil (VIAGRA) 100 MG tablet Take 1 tablet (100 mg total) by mouth daily as needed for erectile dysfunction. (Patient not taking: Reported on 07/28/2020)  . tadalafil (CIALIS) 20 MG tablet Take one tablet every 2 days as needed (Patient not taking: Reported on 07/28/2020)   Facility-Administered Medications Prior to Visit  Medication Dose Route Frequency Provider  . heparin lock flush 100 unit/mL  500 Units Intravenous Once Charlaine Dalton R, MD  . sodium chloride 0.9 % injection 10 mL  10 mL Intracatheter PRN [name], MD  . sodium chloride 0.9 % injection 10 mL  10 mL Intracatheter PRN [name], MD  . sodium chloride 0.9 % injection 10 mL  10 mL Intravenous PRN Choksi, Delorise Shiner, MD    Review of Systems  HENT: Positive for congestion, sinus pressure and sneezing.       Objective    There were no vitals taken for this visit.   Physical Exam Constitutional:      Appearance: Normal appearance.  Pulmonary:  Effort: Pulmonary effort is normal. No respiratory distress.  Neurological:     Mental Status: He is alert.  Psychiatric:        Mood and Affect: Mood normal.        Behavior: Behavior normal.        Assessment & Plan    1. Viral URI  - symptoms and exam c/w viral URI  - no evidence of strep pharyngitis, CAP, AOM, bacterial sinusitis, or other bacterial infection - concern for possible COVID19 infection - will send for outpatient testing - discussed  need to quarantine 10 days from start of symptoms and until fever-free for at least 48 hours - discussed need to quarantine household members - discussed symptomatic management, natural course, and return precautions -low threshold for CXR. Discussed signs/symptoms of bacterial sinusitis.    No follow-ups on file.     I discussed the assessment and treatment plan with the patient. The patient was provided an opportunity to ask questions and all were answered. The patient agreed with the plan and demonstrated an understanding of the instructions.   The patient was advised to call back or seek an in-person evaluation if the symptoms worsen or if the condition fails to improve as anticipated.   ITrinna Post, PA-C, have reviewed all documentation for this visit. The documentation on 09/15/20 for the exam, diagnosis, procedures, and orders are all accurate and complete.  The entirety of the information documented in the History of Present Illness, Review of Systems and Physical Exam were personally obtained by me. Portions of this information were initially documented by Wilburt Finlay, CMA and reviewed by me for thoroughness and accuracy.    Paulene Floor [facility] [phone] (phone) [phone] (fax)  [place]

## 2020-09-15 LAB — NOVEL CORONAVIRUS, NAA: SARS-CoV-2, NAA: NOT DETECTED

## 2020-09-15 NOTE — Telephone Encounter (Signed)
Colette - Please r/s the patient's chemotherapy for next Friday.

## 2020-09-15 NOTE — Telephone Encounter (Signed)
Covid test was negative - Dr. B please advise when to r/s patient's treatment.

## 2020-09-18 ENCOUNTER — Encounter: Payer: Self-pay | Admitting: Physician Assistant

## 2020-09-18 DIAGNOSIS — J0110 Acute frontal sinusitis, unspecified: Secondary | ICD-10-CM

## 2020-09-19 MED ORDER — DOXYCYCLINE HYCLATE 100 MG PO TABS: 100.0000 mg | ORAL_TABLET | Freq: Two times a day (BID) | ORAL | 0 refills | Status: AC

## 2020-09-20 ENCOUNTER — Telehealth: Payer: Self-pay | Admitting: Internal Medicine

## 2020-09-20 NOTE — Telephone Encounter (Signed)
On 10/18- tried to reach pt; phone unable to connect.  Heather- please reach out to pt today- re: follow up plan.  GB

## 2020-09-20 NOTE — Telephone Encounter (Signed)
Attempted to reach patient, no answer. Unable to leave vm. Phone just rings. Mychart msg sent to patient.

## 2020-09-23 ENCOUNTER — Inpatient Hospital Stay: Payer: BC Managed Care – PPO | Admitting: Internal Medicine

## 2020-09-23 ENCOUNTER — Inpatient Hospital Stay: Payer: BC Managed Care – PPO

## 2020-09-30 ENCOUNTER — Inpatient Hospital Stay: Payer: BC Managed Care – PPO | Attending: Internal Medicine

## 2020-09-30 ENCOUNTER — Inpatient Hospital Stay: Payer: BC Managed Care – PPO

## 2020-09-30 ENCOUNTER — Other Ambulatory Visit: Payer: Self-pay

## 2020-09-30 ENCOUNTER — Inpatient Hospital Stay (HOSPITAL_BASED_OUTPATIENT_CLINIC_OR_DEPARTMENT_OTHER): Payer: BC Managed Care – PPO | Admitting: Internal Medicine

## 2020-09-30 DIAGNOSIS — C3402 Malignant neoplasm of left main bronchus: Secondary | ICD-10-CM

## 2020-09-30 DIAGNOSIS — R059 Cough, unspecified: Secondary | ICD-10-CM | POA: Insufficient documentation

## 2020-09-30 DIAGNOSIS — Z8679 Personal history of other diseases of the circulatory system: Secondary | ICD-10-CM | POA: Insufficient documentation

## 2020-09-30 DIAGNOSIS — R0981 Nasal congestion: Secondary | ICD-10-CM | POA: Insufficient documentation

## 2020-09-30 DIAGNOSIS — M7989 Other specified soft tissue disorders: Secondary | ICD-10-CM | POA: Diagnosis not present

## 2020-09-30 DIAGNOSIS — Z806 Family history of leukemia: Secondary | ICD-10-CM | POA: Diagnosis not present

## 2020-09-30 DIAGNOSIS — Z5111 Encounter for antineoplastic chemotherapy: Secondary | ICD-10-CM | POA: Insufficient documentation

## 2020-09-30 LAB — CBC WITH DIFFERENTIAL/PLATELET
Abs Immature Granulocytes: 0.03 10*3/uL (ref 0.00–0.07)
Basophils Absolute: 0.1 10*3/uL (ref 0.0–0.1)
Basophils Relative: 1 %
Eosinophils Absolute: 0.1 10*3/uL (ref 0.0–0.5)
Eosinophils Relative: 2 %
HCT: 40.9 % (ref 39.0–52.0)
Hemoglobin: 14.1 g/dL (ref 13.0–17.0)
Immature Granulocytes: 1 %
Lymphocytes Relative: 35 %
Lymphs Abs: 2.2 10*3/uL (ref 0.7–4.0)
MCH: 31.8 pg (ref 26.0–34.0)
MCHC: 34.5 g/dL (ref 30.0–36.0)
MCV: 92.3 fL (ref 80.0–100.0)
Monocytes Absolute: 0.7 10*3/uL (ref 0.1–1.0)
Monocytes Relative: 11 %
Neutro Abs: 3.2 10*3/uL (ref 1.7–7.7)
Neutrophils Relative %: 50 %
Platelets: 171 10*3/uL (ref 150–400)
RBC: 4.43 MIL/uL (ref 4.22–5.81)
RDW: 12.3 % (ref 11.5–15.5)
WBC: 6.3 10*3/uL (ref 4.0–10.5)
nRBC: 0 % (ref 0.0–0.2)

## 2020-09-30 LAB — COMPREHENSIVE METABOLIC PANEL
ALT: 16 U/L (ref 0–44)
AST: 23 U/L (ref 15–41)
Albumin: 3.6 g/dL (ref 3.5–5.0)
Alkaline Phosphatase: 39 U/L (ref 38–126)
Anion gap: 7 (ref 5–15)
BUN: 18 mg/dL (ref 6–20)
CO2: 27 mmol/L (ref 22–32)
Calcium: 9 mg/dL (ref 8.9–10.3)
Chloride: 103 mmol/L (ref 98–111)
Creatinine, Ser: 1.18 mg/dL (ref 0.61–1.24)
GFR, Estimated: >60 mL/min (ref >60)
Glucose, Bld: 115 mg/dL — ABNORMAL HIGH (ref 70–99)
Potassium: 3.5 mmol/L (ref 3.5–5.1)
Sodium: 137 mmol/L (ref 135–145)
Total Bilirubin: 1 mg/dL (ref 0.3–1.2)
Total Protein: 7.4 g/dL (ref 6.5–8.1)

## 2020-09-30 MED ORDER — HYDROCODONE-ACETAMINOPHEN 5-325 MG PO TABS
1.0000 | ORAL_TABLET | Freq: Four times a day (QID) | ORAL | 0 refills | Status: DC | PRN
Start: 2020-09-30 — End: 2020-09-30

## 2020-09-30 MED ORDER — SODIUM CHLORIDE 0.9% FLUSH
10.0000 mL | INTRAVENOUS | Status: AC | PRN
  Administered 2020-09-30: 10 mL via INTRAVENOUS
  Filled 2020-09-30: qty 10

## 2020-09-30 MED ORDER — SODIUM CHLORIDE 0.9 % IV SOLN
500.0000 mg/m2 | Freq: Once | INTRAVENOUS | Status: AC
  Administered 2020-09-30: 1100 mg via INTRAVENOUS
  Filled 2020-09-30: qty 40

## 2020-09-30 MED ORDER — SODIUM CHLORIDE 0.9 % IV SOLN
Freq: Once | INTRAVENOUS | Status: AC
  Filled 2020-09-30: qty 250

## 2020-09-30 MED ORDER — HEPARIN SOD (PORK) LOCK FLUSH 100 UNIT/ML IV SOLN
500.0000 [IU] | Freq: Once | INTRAVENOUS | Status: AC | PRN
  Administered 2020-09-30: 500 [IU]
  Filled 2020-09-30: qty 5

## 2020-09-30 MED ORDER — PROCHLORPERAZINE MALEATE 10 MG PO TABS
10.0000 mg | ORAL_TABLET | Freq: Once | ORAL | Status: AC
  Administered 2020-09-30: 10 mg via ORAL
  Filled 2020-09-30: qty 1

## 2020-09-30 MED ORDER — DEXAMETHASONE SODIUM PHOSPHATE 10 MG/ML IJ SOLN
6.0000 mg | Freq: Once | INTRAMUSCULAR | Status: AC
  Administered 2020-09-30: 6 mg via INTRAVENOUS
  Filled 2020-09-30: qty 1

## 2020-09-30 MED ORDER — CYANOCOBALAMIN 1000 MCG/ML IJ SOLN
1000.0000 ug | Freq: Once | INTRAMUSCULAR | Status: AC
  Administered 2020-09-30: 1000 ug via INTRAMUSCULAR
  Filled 2020-09-30: qty 1

## 2020-09-30 NOTE — Progress Notes (Signed)
[place] OFFICE PROGRESS NOTE  Patient Care Team: [name], MD as PCP - General [facility])  Cancer Staging No matching staging information was found for the patient.   Oncology History Overview Note  # 2012- METASTATIC ADENO CA of LEFT LUNG [acinar pattern] s/p MED LN Bx [NEG- EGFR/K-ras/ALK mutation];[name]; [name]; CT JULY  2016- NED; JAN 2018- NED;   # AUG 2020-clinical trial closed; continue maintenance Alimta  # Thoracic aortic aneurysm-4.0 cm in 2016; currently 4.6 cm.  s/p referral to Dr. Faith Rogue.; MRI in 6 months planned.  ----------------------------------------------------------    DIAGNOSIS: [name]  STAGE: 4       ;GOALS: Palliative  CURRENT/MOST RECENT THERAPY '[ ]'  Alimta maintenance    Cancer of hilus of left lung ([place])  07/20/2016 Initial Diagnosis   Cancer of hilus of left lung ([place])   12/11/2019 -  Chemotherapy   The patient had dexamethasone (DECADRON) 4 MG tablet, 1 of 1 cycle, Start date: --, End date: -- PEMEtrexed (ALIMTA) 1,100 mg in sodium chloride 0.9 % 100 mL chemo infusion, 500 mg/m2 = 1,100 mg, Intravenous,  Once, 12 of 15 cycles Administration: 1,100 mg (12/11/2019), 1,100 mg (01/22/2020), 1,100 mg (02/12/2020), 1,100 mg (03/25/2020), 1,100 mg (01/01/2020), 1,100 mg (03/04/2020), 1,100 mg (04/15/2020), 1,100 mg (05/06/2020), 1,100 mg (07/08/2020), 1,100 mg (07/29/2020), 1,100 mg (08/19/2020), 1,100 mg (09/30/2020)  for chemotherapy treatment.       INTERVAL HISTORY:  Mitchell Mosley 59 y.o.  male pleasant patient above history of metastatic adenocarcinoma the lung-NED currently on maintenance Alimta is here for follow-up.  In the interim patient was diagnosed with sinus-like infection with cough nasal congestion.  No fever no chills.  Covid testing negative.  She is post antibiotics.  Symptoms significantly  improved; however not complete as well.  Chemotherapy was held last cycle because of patient's symptoms.   Patient continues to be physically active.  Denies any nausea vomiting but denies any chills.   Review of Systems  Constitutional: Negative for chills, diaphoresis, fever, malaise/fatigue and weight loss.  HENT: Negative for nosebleeds and sore throat.   Eyes: Negative for double vision.  Respiratory: Negative for hemoptysis, shortness of breath and wheezing.   Cardiovascular: Positive for leg swelling. Negative for chest pain, palpitations and orthopnea.  Gastrointestinal: Negative for abdominal pain, blood in stool, constipation, diarrhea, heartburn, melena, nausea and vomiting.  Genitourinary: Negative for dysuria, frequency and urgency.  Musculoskeletal: Positive for joint pain.  Skin: Negative.  Negative for itching and rash.  Neurological: Negative for dizziness, tingling, focal weakness, weakness and headaches.  Endo/Heme/Allergies: Does not bruise/bleed easily.  Psychiatric/Behavioral: Negative for depression. The patient is not nervous/anxious and does not have insomnia.       PAST MEDICAL HISTORY :  Past Medical History:  Diagnosis Date  . Allergy   . Lung cancer ([place])   . Mild valvular heart disease Nov. 2014   per 2 D Echocardiogram done for persistent leg edema, monitored by cardiology  . Peripheral edema     PAST SURGICAL HISTORY :   Past Surgical History:  Procedure Laterality Date  . ELBOW SURGERY Right 2011  . Left anterior thoracotomy with biopsy  March 2012  . PORTACATH PLACEMENT  March 2012  . TONSILLECTOMY  as child    FAMILY HISTORY :   Family History  Problem Relation Age of Onset  . Leukemia Father   .  Diabetes Mother     SOCIAL HISTORY:   Social History   Tobacco Use  . Smoking status: Never Smoker  . Smokeless tobacco: Never Used  Substance Use Topics  . Alcohol use: Yes    Alcohol/week: 0.0 standard drinks    Comment: "social  drinker"   . Drug use: No    ALLERGIES:  is allergic to penicillins.  MEDICATIONS:  Current Outpatient Medications  Medication Sig Dispense Refill  . folic acid (FOLVITE) 1 MG tablet TAKE 1 TABLET BY MOUTH EVERY DAY 90 tablet 3  . furosemide (LASIX) 20 MG tablet TAKE 1 TABLET (20 MG TOTAL) BY MOUTH DAILY AS NEEDED FOR SWELLING 90 tablet 1  . meloxicam (MOBIC) 7.5 MG tablet Take 7.5 mg by mouth daily.     . Multiple Vitamin (MULTIVITAMIN) capsule Take 1 capsule by mouth daily. Reported on 02/24/2016    . NON FORMULARY Relief Factor with vitamin D and Turmeric    . Omega-3 Fatty Acids (FISH OIL) 1000 MG CAPS Take 1 capsule by mouth daily. Reported on 02/24/2016    . senna (SENOKOT) 8.6 MG tablet Take 1 tablet by mouth daily. Take as needed around time of chemotherapy    . sildenafil (VIAGRA) 100 MG tablet Take 1 tablet (100 mg total) by mouth daily as needed for erectile dysfunction. (Patient not taking: Reported on 07/28/2020) 20 tablet 11  . spironolactone (ALDACTONE) 25 MG tablet TAKE 1 TABLET BY MOUTH EVERY DAY 90 tablet 3  . tadalafil (CIALIS) 20 MG tablet Take one tablet every 2 days as needed (Patient not taking: Reported on 07/28/2020) 50 tablet 5  . vitamin B-12 (CYANOCOBALAMIN) 1000 MCG tablet Take 1,000 mcg by mouth daily.     No current facility-administered medications for this visit.   Facility-Administered Medications Ordered in Other Visits  Medication Dose Route Frequency Provider Last Rate Last Admin  . heparin lock flush 100 unit/mL  500 Units Intravenous Once Charlaine Dalton R, [name], MD   10 mL at 05/27/15 1543  . sodium chloride 0.9 % injection 10 mL  10 mL Intracatheter PRN [name], MD   10 mL at 06/17/15 1510  . sodium chloride 0.9 % injection 10 mL  10 mL Intravenous PRN Choksi, Delorise Shiner, MD   10 mL at 07/08/15 1340  . sodium chloride flush (NS) 0.9 % injection 10 mL  10 mL  Intravenous PRN Verlon Au, NP   10 mL at 09/30/20 1412    PHYSICAL EXAMINATION: ECOG PERFORMANCE STATUS: 0 - Asymptomatic  BP 120/79 (BP Location: Left Arm, Patient Position: Sitting, Cuff Size: Normal)   Pulse 80   Temp 97.9 F (36.6 C)   Resp 16   Ht '5\' 10"'  (1.778 m)   Wt 210 lb (95.3 kg)   SpO2 100%   BMI 30.13 kg/m   Filed Weights   09/30/20 1430  Weight: 210 lb (95.3 kg)    Physical Exam HENT:     Head: Normocephalic and atraumatic.     Mouth/Throat:     Pharynx: No oropharyngeal exudate.  Eyes:     Pupils: Pupils are equal, round, and reactive to light.  Cardiovascular:     Rate and Rhythm: Normal rate and regular rhythm.  Pulmonary:     Effort: No respiratory distress.     Breath sounds: No wheezing.  Abdominal:     General: Bowel sounds  are normal. There is no distension.     Palpations: Abdomen is soft. There is no mass.     Tenderness: There is no abdominal tenderness. There is no guarding or rebound.  Musculoskeletal:        General: No tenderness. Normal range of motion.     Cervical back: Normal range of motion and neck supple.     Comments: Grade 1 swelling in the legs bilaterally.  Skin:    General: Skin is warm.  Neurological:     Mental Status: He is alert and oriented to person, place, and time.  Psychiatric:        Mood and Affect: Affect normal.      LABORATORY DATA:  I have reviewed the data as listed    Component Value Date/Time   NA 137 09/30/2020 1404   NA 137 04/27/2020 1029   NA 138 12/10/2014 1341   K 3.5 09/30/2020 1404   K 3.6 12/10/2014 1341   CL 103 09/30/2020 1404   CL 102 12/10/2014 1341   CO2 27 09/30/2020 1404   CO2 30 12/10/2014 1341   GLUCOSE 115 (H) 09/30/2020 1404   GLUCOSE 160 (H) 12/10/2014 1341   BUN 18 09/30/2020 1404   BUN 15 04/27/2020 1029   BUN 21 (H) 12/10/2014 1341   CREATININE 1.18 09/30/2020 1404   CREATININE 1.10 03/25/2015 1453   CALCIUM 9.0 09/30/2020 1404   CALCIUM 8.4 (L) 12/10/2014  1341   PROT 7.4 09/30/2020 1404   PROT 7.3 04/27/2020 1029   PROT 6.6 12/10/2014 1341   ALBUMIN 3.6 09/30/2020 1404   ALBUMIN 4.7 04/27/2020 1029   ALBUMIN 3.4 12/10/2014 1341   AST 23 09/30/2020 1404   AST 17 12/10/2014 1341   ALT 16 09/30/2020 1404   ALT 23 12/10/2014 1341   ALKPHOS 39 09/30/2020 1404   ALKPHOS 50 12/10/2014 1341   BILITOT 1.0 09/30/2020 1404   BILITOT 0.2 04/27/2020 1029   BILITOT 0.5 12/10/2014 1341   GFRNONAA >60 09/30/2020 1404   GFRNONAA >60 03/25/2015 1453   GFRAA >60 08/19/2020 1324   GFRAA >60 03/25/2015 1453    No results found for: SPEP, UPEP  Lab Results  Component Value Date   WBC 6.3 09/30/2020   NEUTROABS 3.2 09/30/2020   HGB 14.1 09/30/2020   HCT 40.9 09/30/2020   MCV 92.3 09/30/2020   PLT 171 09/30/2020      Chemistry      Component Value Date/Time   NA 137 09/30/2020 1404   NA 137 04/27/2020 1029   NA 138 12/10/2014 1341   K 3.5 09/30/2020 1404   K 3.6 12/10/2014 1341   CL 103 09/30/2020 1404   CL 102 12/10/2014 1341   CO2 27 09/30/2020 1404   CO2 30 12/10/2014 1341   BUN 18 09/30/2020 1404   BUN 15 04/27/2020 1029   BUN 21 (H) 12/10/2014 1341   CREATININE 1.18 09/30/2020 1404   CREATININE 1.10 03/25/2015 1453      Component Value Date/Time   CALCIUM 9.0 09/30/2020 1404   CALCIUM 8.4 (L) 12/10/2014 1341   ALKPHOS 39 09/30/2020 1404   ALKPHOS 50 12/10/2014 1341   AST 23 09/30/2020 1404   AST 17 12/10/2014 1341   ALT 16 09/30/2020 1404   ALT 23 12/10/2014 1341   BILITOT 1.0 09/30/2020 1404   BILITOT 0.2 04/27/2020 1029   BILITOT 0.5 12/10/2014 1341       RADIOGRAPHIC STUDIES: I have personally reviewed the radiological images as  listed and agreed with the findings in the report. No results found.   ASSESSMENT & PLAN:  Cancer of hilus of left lung [facility]) # Adenocarcinoma the lung; stage IV; June 1st 2021- CT scan chest/a/p-shows no evidence of metastatic disease- STABLE  # Proceed with Alimta chemotherapy.  Labs today reviewed;  acceptable for treatment today.  # Swelling in the hands/feet-grade 1 asymptomatic.? Alimta; monitor; STABLE.    #Thoracic aneurysm-approximately 4.5 cm in size; on surveillance- STABLE   # DISPOSITION:  # Alimta today # Follow up in 3 weeks MD-labs- cbc/cmp; alimta; Dr.B     No orders of the defined types were placed in this encounter.  All questions were answered. The patient knows to call the clinic with any problems, questions or concerns.      [name], MD 10/03/2020 10:01 AM

## 2020-09-30 NOTE — Assessment & Plan Note (Addendum)
#   Adenocarcinoma the lung; stage IV; June 1st 2021- CT scan chest/a/p-shows no evidence of metastatic disease- STABLE  # Proceed with Alimta chemotherapy. Labs today reviewed;  acceptable for treatment today.  # Swelling in the hands/feet-grade 1 asymptomatic.? Alimta; monitor; STABLE.    #Thoracic aneurysm-approximately 4.5 cm in size; on surveillance- STABLE   # DISPOSITION:  # Alimta today # Follow up in 3 weeks MD-labs- cbc/cmp; alimta; Dr.B

## 2020-10-04 ENCOUNTER — Other Ambulatory Visit: Payer: Self-pay | Admitting: Internal Medicine

## 2020-10-04 DIAGNOSIS — R609 Edema, unspecified: Secondary | ICD-10-CM

## 2020-10-04 DIAGNOSIS — C33 Malignant neoplasm of trachea: Secondary | ICD-10-CM

## 2020-10-04 DIAGNOSIS — C3480 Malignant neoplasm of overlapping sites of unspecified bronchus and lung: Secondary | ICD-10-CM

## 2020-10-21 ENCOUNTER — Inpatient Hospital Stay: Payer: BC Managed Care – PPO | Attending: Internal Medicine

## 2020-10-21 ENCOUNTER — Inpatient Hospital Stay (HOSPITAL_BASED_OUTPATIENT_CLINIC_OR_DEPARTMENT_OTHER): Payer: BC Managed Care – PPO | Admitting: Internal Medicine

## 2020-10-21 ENCOUNTER — Other Ambulatory Visit: Payer: Self-pay

## 2020-10-21 ENCOUNTER — Inpatient Hospital Stay: Payer: BC Managed Care – PPO

## 2020-10-21 DIAGNOSIS — C3402 Malignant neoplasm of left main bronchus: Secondary | ICD-10-CM

## 2020-10-21 DIAGNOSIS — Z5111 Encounter for antineoplastic chemotherapy: Secondary | ICD-10-CM | POA: Diagnosis not present

## 2020-10-21 DIAGNOSIS — R609 Edema, unspecified: Secondary | ICD-10-CM | POA: Diagnosis not present

## 2020-10-21 DIAGNOSIS — Z8679 Personal history of other diseases of the circulatory system: Secondary | ICD-10-CM | POA: Insufficient documentation

## 2020-10-21 DIAGNOSIS — Z806 Family history of leukemia: Secondary | ICD-10-CM | POA: Diagnosis not present

## 2020-10-21 LAB — CBC WITH DIFFERENTIAL/PLATELET
Abs Immature Granulocytes: 0.01 10*3/uL (ref 0.00–0.07)
Basophils Absolute: 0 10*3/uL (ref 0.0–0.1)
Basophils Relative: 0 %
Eosinophils Absolute: 0.1 10*3/uL (ref 0.0–0.5)
Eosinophils Relative: 2 %
HCT: 40.1 % (ref 39.0–52.0)
Hemoglobin: 13.9 g/dL (ref 13.0–17.0)
Immature Granulocytes: 0 %
Lymphocytes Relative: 36 %
Lymphs Abs: 2 10*3/uL (ref 0.7–4.0)
MCH: 31.7 pg (ref 26.0–34.0)
MCHC: 34.7 g/dL (ref 30.0–36.0)
MCV: 91.3 fL (ref 80.0–100.0)
Monocytes Absolute: 0.5 10*3/uL (ref 0.1–1.0)
Monocytes Relative: 9 %
Neutro Abs: 2.9 10*3/uL (ref 1.7–7.7)
Neutrophils Relative %: 53 %
Platelets: 151 10*3/uL (ref 150–400)
RBC: 4.39 MIL/uL (ref 4.22–5.81)
RDW: 12.4 % (ref 11.5–15.5)
WBC: 5.5 10*3/uL (ref 4.0–10.5)
nRBC: 0 % (ref 0.0–0.2)

## 2020-10-21 LAB — COMPREHENSIVE METABOLIC PANEL
ALT: 16 U/L (ref 0–44)
AST: 23 U/L (ref 15–41)
Albumin: 3.8 g/dL (ref 3.5–5.0)
Alkaline Phosphatase: 45 U/L (ref 38–126)
Anion gap: 9 (ref 5–15)
BUN: 22 mg/dL — ABNORMAL HIGH (ref 6–20)
CO2: 27 mmol/L (ref 22–32)
Calcium: 9.3 mg/dL (ref 8.9–10.3)
Chloride: 99 mmol/L (ref 98–111)
Creatinine, Ser: 1.17 mg/dL (ref 0.61–1.24)
GFR, Estimated: >60 mL/min (ref >60)
Glucose, Bld: 133 mg/dL — ABNORMAL HIGH (ref 70–99)
Potassium: 3.7 mmol/L (ref 3.5–5.1)
Sodium: 135 mmol/L (ref 135–145)
Total Bilirubin: 0.9 mg/dL (ref 0.3–1.2)
Total Protein: 7.2 g/dL (ref 6.5–8.1)

## 2020-10-21 MED ORDER — HEPARIN SOD (PORK) LOCK FLUSH 100 UNIT/ML IV SOLN
500.0000 [IU] | Freq: Once | INTRAVENOUS | Status: AC | PRN
  Administered 2020-10-21: 500 [IU]
  Filled 2020-10-21: qty 5

## 2020-10-21 MED ORDER — PROCHLORPERAZINE MALEATE 10 MG PO TABS
10.0000 mg | ORAL_TABLET | Freq: Once | ORAL | Status: AC
  Administered 2020-10-21: 10 mg via ORAL
  Filled 2020-10-21: qty 1

## 2020-10-21 MED ORDER — HEPARIN SOD (PORK) LOCK FLUSH 100 UNIT/ML IV SOLN
INTRAVENOUS | Status: AC
  Filled 2020-10-21: qty 5

## 2020-10-21 MED ORDER — SODIUM CHLORIDE 0.9 % IV SOLN
Freq: Once | INTRAVENOUS | Status: AC
  Filled 2020-10-21: qty 250

## 2020-10-21 MED ORDER — SODIUM CHLORIDE 0.9% FLUSH
10.0000 mL | Freq: Once | INTRAVENOUS | Status: AC
  Administered 2020-10-21: 10 mL via INTRAVENOUS
  Filled 2020-10-21: qty 10

## 2020-10-21 MED ORDER — DEXAMETHASONE SODIUM PHOSPHATE 10 MG/ML IJ SOLN
6.0000 mg | Freq: Once | INTRAMUSCULAR | Status: AC
  Administered 2020-10-21: 6 mg via INTRAVENOUS
  Filled 2020-10-21: qty 1

## 2020-10-21 MED ORDER — SODIUM CHLORIDE 0.9 % IV SOLN
500.0000 mg/m2 | Freq: Once | INTRAVENOUS | Status: AC
  Administered 2020-10-21: 1100 mg via INTRAVENOUS
  Filled 2020-10-21: qty 40

## 2020-10-21 NOTE — Progress Notes (Signed)
[place] OFFICE PROGRESS NOTE  Patient Care Team: [name], MD as PCP - General [facility] Medicine)  Cancer Staging No matching staging information was found for the patient.   Oncology History Overview Note  # 2012- METASTATIC ADENO CA of LEFT LUNG [acinar pattern] s/p MED LN Bx [NEG- EGFR/K-ras/ALK mutation];[name]; [name]; CT JULY  2016- NED; JAN 2018- NED;   # AUG 2020-clinical trial closed; continue maintenance Alimta  # Thoracic aortic aneurysm-4.0 cm in 2016; currently 4.6 cm.  s/p referral to Dr. Faith Rogue.; MRI in 6 months planned.  ----------------------------------------------------------    DIAGNOSIS: Mitchell Mosley ] Adenocarcinoma lung  STAGE: 4       ;GOALS: Palliative  CURRENT/MOST RECENT THERAPY '[ ]'  Alimta maintenance    Cancer of hilus of left lung ([place])  07/20/2016 Initial Diagnosis   Cancer of hilus of left lung ([place])   12/11/2019 -  Chemotherapy   The patient had dexamethasone (DECADRON) 4 MG tablet, 1 of 1 cycle, Start date: --, End date: -- PEMEtrexed (ALIMTA) 1,100 mg in sodium chloride 0.9 % 100 mL chemo infusion, 500 mg/m2 = 1,100 mg, Intravenous,  Once, 13 of 22 cycles Administration: 1,100 mg (12/11/2019), 1,100 mg (01/22/2020), 1,100 mg (02/12/2020), 1,100 mg (03/25/2020), 1,100 mg (01/01/2020), 1,100 mg (03/04/2020), 1,100 mg (04/15/2020), 1,100 mg (05/06/2020), 1,100 mg (07/08/2020), 1,100 mg (07/29/2020), 1,100 mg (08/19/2020), 1,100 mg (09/30/2020), 1,100 mg (10/21/2020)  for chemotherapy treatment.       INTERVAL HISTORY:  Mitchell Mosley 59 y.o.  male pleasant patient above history of metastatic adenocarcinoma the lung-NED currently on maintenance Alimta is here for follow-up.  Patient notes no new shortness of breath or cough.  No fevers or chills.  Worsening swelling in the legs.  Continues with physically active.  Review of Systems   Constitutional: Negative for chills, diaphoresis, fever, malaise/fatigue and weight loss.  HENT: Negative for nosebleeds and sore throat.   Eyes: Negative for double vision.  Respiratory: Negative for hemoptysis, shortness of breath and wheezing.   Cardiovascular: Positive for leg swelling. Negative for chest pain, palpitations and orthopnea.  Gastrointestinal: Negative for abdominal pain, blood in stool, constipation, diarrhea, heartburn, melena, nausea and vomiting.  Genitourinary: Negative for dysuria, frequency and urgency.  Musculoskeletal: Positive for joint pain.  Skin: Negative.  Negative for itching and rash.  Neurological: Negative for dizziness, tingling, focal weakness, weakness and headaches.  Endo/Heme/Allergies: Does not bruise/bleed easily.  Psychiatric/Behavioral: Negative for depression. The patient is not nervous/anxious and does not have insomnia.       PAST MEDICAL HISTORY :  Past Medical History:  Diagnosis Date  . Allergy   . Lung cancer ([place])   . Mild valvular heart disease Nov. 2014   per 2 D Echocardiogram done for persistent leg edema, monitored by cardiology  . Peripheral edema     PAST SURGICAL HISTORY :   Past Surgical History:  Procedure Laterality Date  . ELBOW SURGERY Right 2011  . Left anterior thoracotomy with biopsy  March 2012  . PORTACATH PLACEMENT  March 2012  . TONSILLECTOMY  as child    FAMILY HISTORY :   Family History  Problem Relation Age of Onset  . Leukemia Father   . Diabetes Mother     SOCIAL HISTORY:   Social History   Tobacco Use  . Smoking status: Never Smoker  . Smokeless tobacco: Never Used  Substance Use Topics  .  Alcohol use: Yes    Alcohol/week: 0.0 standard drinks    Comment: "social drinker"   . Drug use: No    ALLERGIES:  is allergic to penicillins.  MEDICATIONS:  Current Outpatient Medications  Medication Sig Dispense Refill  . folic acid (FOLVITE) 1 MG tablet TAKE 1 TABLET BY MOUTH EVERY DAY 90  tablet 3  . furosemide (LASIX) 20 MG tablet TAKE 1 TABLET (20 MG TOTAL) BY MOUTH DAILY AS NEEDED FOR SWELLING 90 tablet 1  . meloxicam (MOBIC) 7.5 MG tablet Take 7.5 mg by mouth daily.     . Multiple Vitamin (MULTIVITAMIN) capsule Take 1 capsule by mouth daily. Reported on 02/24/2016    . NON FORMULARY Relief Factor with vitamin D and Turmeric    . Omega-3 Fatty Acids (FISH OIL) 1000 MG CAPS Take 1 capsule by mouth daily. Reported on 02/24/2016    . senna (SENOKOT) 8.6 MG tablet Take 1 tablet by mouth daily. Take as needed around time of chemotherapy    . sildenafil (VIAGRA) 100 MG tablet Take 1 tablet (100 mg total) by mouth daily as needed for erectile dysfunction. 20 tablet 11  . spironolactone (ALDACTONE) 25 MG tablet TAKE 1 TABLET BY MOUTH EVERY DAY 90 tablet 3  . tadalafil (CIALIS) 20 MG tablet Take one tablet every 2 days as needed 50 tablet 5  . vitamin B-12 (CYANOCOBALAMIN) 1000 MCG tablet Take 1,000 mcg by mouth daily.     No current facility-administered medications for this visit.   Facility-Administered Medications Ordered in Other Visits  Medication Dose Route Frequency Provider Last Rate Last Admin  . heparin lock flush 100 unit/mL  500 Units Intravenous Once Charlaine Dalton R, [name], MD   10 mL at 05/27/15 1543  . sodium chloride 0.9 % injection 10 mL  10 mL Intracatheter PRN [name], MD   10 mL at 06/17/15 1510  . sodium chloride 0.9 % injection 10 mL  10 mL Intravenous PRN Choksi, Delorise Shiner, MD   10 mL at 07/08/15 1340  . sodium chloride flush (NS) 0.9 % injection 10 mL  10 mL Intravenous PRN Verlon Au, NP   10 mL at 09/30/20 1412    PHYSICAL EXAMINATION: ECOG PERFORMANCE STATUS: 0 - Asymptomatic  BP 124/75 (BP Location: Left Arm, Patient Position: Sitting)   Pulse 75   Temp (!) 96.9 F (36.1 C) (Tympanic)   Resp 18   Wt 211 lb (95.7 kg)   BMI 30.28 kg/m   Filed Weights    10/21/20 1409  Weight: 211 lb (95.7 kg)    Physical Exam HENT:     Head: Normocephalic and atraumatic.     Mouth/Throat:     Pharynx: No oropharyngeal exudate.  Eyes:     Pupils: Pupils are equal, round, and reactive to light.  Cardiovascular:     Rate and Rhythm: Normal rate and regular rhythm.  Pulmonary:     Effort: No respiratory distress.     Breath sounds: No wheezing.  Abdominal:     General: Bowel sounds are normal. There is no distension.     Palpations: Abdomen is soft. There is no mass.     Tenderness: There is no abdominal tenderness. There is no guarding or rebound.  Musculoskeletal:        General: No tenderness. Normal range of motion.     Cervical back: Normal range  of motion and neck supple.     Comments: Grade 1 swelling in the legs bilaterally.  Skin:    General: Skin is warm.  Neurological:     Mental Status: He is alert and oriented to person, place, and time.  Psychiatric:        Mood and Affect: Affect normal.      LABORATORY DATA:  I have reviewed the data as listed    Component Value Date/Time   NA 135 10/21/2020 1353   NA 137 04/27/2020 1029   NA 138 12/10/2014 1341   K 3.7 10/21/2020 1353   K 3.6 12/10/2014 1341   CL 99 10/21/2020 1353   CL 102 12/10/2014 1341   CO2 27 10/21/2020 1353   CO2 30 12/10/2014 1341   GLUCOSE 133 (H) 10/21/2020 1353   GLUCOSE 160 (H) 12/10/2014 1341   BUN 22 (H) 10/21/2020 1353   BUN 15 04/27/2020 1029   BUN 21 (H) 12/10/2014 1341   CREATININE 1.17 10/21/2020 1353   CREATININE 1.10 03/25/2015 1453   CALCIUM 9.3 10/21/2020 1353   CALCIUM 8.4 (L) 12/10/2014 1341   PROT 7.2 10/21/2020 1353   PROT 7.3 04/27/2020 1029   PROT 6.6 12/10/2014 1341   ALBUMIN 3.8 10/21/2020 1353   ALBUMIN 4.7 04/27/2020 1029   ALBUMIN 3.4 12/10/2014 1341   AST 23 10/21/2020 1353   AST 17 12/10/2014 1341   ALT 16 10/21/2020 1353   ALT 23 12/10/2014 1341   ALKPHOS 45 10/21/2020 1353   ALKPHOS 50 12/10/2014 1341   BILITOT  0.9 10/21/2020 1353   BILITOT 0.2 04/27/2020 1029   BILITOT 0.5 12/10/2014 1341   GFRNONAA >60 10/21/2020 1353   GFRNONAA >60 03/25/2015 1453   GFRAA >60 08/19/2020 1324   GFRAA >60 03/25/2015 1453    No results found for: SPEP, UPEP  Lab Results  Component Value Date   WBC 5.5 10/21/2020   NEUTROABS 2.9 10/21/2020   HGB 13.9 10/21/2020   HCT 40.1 10/21/2020   MCV 91.3 10/21/2020   PLT 151 10/21/2020      Chemistry      Component Value Date/Time   NA 135 10/21/2020 1353   NA 137 04/27/2020 1029   NA 138 12/10/2014 1341   K 3.7 10/21/2020 1353   K 3.6 12/10/2014 1341   CL 99 10/21/2020 1353   CL 102 12/10/2014 1341   CO2 27 10/21/2020 1353   CO2 30 12/10/2014 1341   BUN 22 (H) 10/21/2020 1353   BUN 15 04/27/2020 1029   BUN 21 (H) 12/10/2014 1341   CREATININE 1.17 10/21/2020 1353   CREATININE 1.10 03/25/2015 1453      Component Value Date/Time   CALCIUM 9.3 10/21/2020 1353   CALCIUM 8.4 (L) 12/10/2014 1341   ALKPHOS 45 10/21/2020 1353   ALKPHOS 50 12/10/2014 1341   AST 23 10/21/2020 1353   AST 17 12/10/2014 1341   ALT 16 10/21/2020 1353   ALT 23 12/10/2014 1341   BILITOT 0.9 10/21/2020 1353   BILITOT 0.2 04/27/2020 1029   BILITOT 0.5 12/10/2014 1341       RADIOGRAPHIC STUDIES: I have personally reviewed the radiological images as listed and agreed with the findings in the report. No results found.   ASSESSMENT & PLAN:  Cancer of hilus of left lung [facility]) # Adenocarcinoma the lung; stage IV; June 1st 2021- CT scan chest/a/p-shows no evidence of metastatic disease- STABLE.   # Proceed with Alimta chemotherapy. Labs today reviewed;  acceptable for  treatment today; B12 QO cycle.   # Swelling in the hands/feet-grade 1 asymptomatic.? Alimta; monitor; STABLE  #Thoracic aneurysm-approximately 4.5 cm in size; on surveillance- STABLE.   # DISPOSITION:  # Alimta today # Follow up in 3 weeks MD-labs- cbc/cmp; alimta; Dr.B     No orders of the defined types  were placed in this encounter.  All questions were answered. The patient knows to call the clinic with any problems, questions or concerns.      [name], MD 10/23/2020 9:52 AM

## 2020-10-21 NOTE — Progress Notes (Signed)
Pt in for follow up, denies any concerns today. 

## 2020-10-21 NOTE — Assessment & Plan Note (Addendum)
#   Adenocarcinoma the lung; stage IV; June 1st 2021- CT scan chest/a/p-shows no evidence of metastatic disease- STABLE.   # Proceed with Alimta chemotherapy. Labs today reviewed;  acceptable for treatment today; B12 QO cycle.   # Swelling in the hands/feet-grade 1 asymptomatic.? Alimta; monitor; STABLE  #Thoracic aneurysm-approximately 4.5 cm in size; on surveillance- STABLE.   # DISPOSITION:  # Alimta today # Follow up in 3 weeks MD-labs- cbc/cmp; alimta; Dr.B

## 2020-10-21 NOTE — Progress Notes (Signed)
Pt received alimta infusion in clinic today per order. Tolerated well.

## 2020-11-11 ENCOUNTER — Inpatient Hospital Stay: Payer: BC Managed Care – PPO | Attending: Internal Medicine

## 2020-11-11 ENCOUNTER — Encounter: Payer: Self-pay | Admitting: Internal Medicine

## 2020-11-11 ENCOUNTER — Inpatient Hospital Stay (HOSPITAL_BASED_OUTPATIENT_CLINIC_OR_DEPARTMENT_OTHER): Payer: BC Managed Care – PPO | Admitting: Internal Medicine

## 2020-11-11 ENCOUNTER — Inpatient Hospital Stay: Payer: BC Managed Care – PPO

## 2020-11-11 DIAGNOSIS — Z8679 Personal history of other diseases of the circulatory system: Secondary | ICD-10-CM | POA: Insufficient documentation

## 2020-11-11 DIAGNOSIS — C3402 Malignant neoplasm of left main bronchus: Secondary | ICD-10-CM | POA: Insufficient documentation

## 2020-11-11 DIAGNOSIS — Z5111 Encounter for antineoplastic chemotherapy: Secondary | ICD-10-CM | POA: Insufficient documentation

## 2020-11-11 DIAGNOSIS — Z806 Family history of leukemia: Secondary | ICD-10-CM | POA: Insufficient documentation

## 2020-11-11 DIAGNOSIS — M7989 Other specified soft tissue disorders: Secondary | ICD-10-CM | POA: Insufficient documentation

## 2020-11-11 LAB — COMPREHENSIVE METABOLIC PANEL
ALT: 15 U/L (ref 0–44)
AST: 23 U/L (ref 15–41)
Albumin: 4.1 g/dL (ref 3.5–5.0)
Alkaline Phosphatase: 42 U/L (ref 38–126)
Anion gap: 11 (ref 5–15)
BUN: 14 mg/dL (ref 6–20)
CO2: 27 mmol/L (ref 22–32)
Calcium: 8.9 mg/dL (ref 8.9–10.3)
Chloride: 98 mmol/L (ref 98–111)
Creatinine, Ser: 1.01 mg/dL (ref 0.61–1.24)
GFR, Estimated: >60 mL/min (ref >60)
Glucose, Bld: 170 mg/dL — ABNORMAL HIGH (ref 70–99)
Potassium: 3.8 mmol/L (ref 3.5–5.1)
Sodium: 136 mmol/L (ref 135–145)
Total Bilirubin: 0.7 mg/dL (ref 0.3–1.2)
Total Protein: 7.1 g/dL (ref 6.5–8.1)

## 2020-11-11 LAB — CBC WITH DIFFERENTIAL/PLATELET
Abs Immature Granulocytes: 0.01 10*3/uL (ref 0.00–0.07)
Basophils Absolute: 0 10*3/uL (ref 0.0–0.1)
Basophils Relative: 1 %
Eosinophils Absolute: 0.1 10*3/uL (ref 0.0–0.5)
Eosinophils Relative: 2 %
HCT: 41.7 % (ref 39.0–52.0)
Hemoglobin: 14.2 g/dL (ref 13.0–17.0)
Immature Granulocytes: 0 %
Lymphocytes Relative: 31 %
Lymphs Abs: 1.8 10*3/uL (ref 0.7–4.0)
MCH: 31.6 pg (ref 26.0–34.0)
MCHC: 34.1 g/dL (ref 30.0–36.0)
MCV: 92.9 fL (ref 80.0–100.0)
Monocytes Absolute: 0.6 10*3/uL (ref 0.1–1.0)
Monocytes Relative: 11 %
Neutro Abs: 3.2 10*3/uL (ref 1.7–7.7)
Neutrophils Relative %: 55 %
Platelets: 165 10*3/uL (ref 150–400)
RBC: 4.49 MIL/uL (ref 4.22–5.81)
RDW: 13.1 % (ref 11.5–15.5)
WBC: 5.7 10*3/uL (ref 4.0–10.5)
nRBC: 0 % (ref 0.0–0.2)

## 2020-11-11 MED ORDER — SODIUM CHLORIDE 0.9 % IV SOLN
Freq: Once | INTRAVENOUS | Status: AC
  Filled 2020-11-11: qty 250

## 2020-11-11 MED ORDER — PROCHLORPERAZINE MALEATE 10 MG PO TABS
10.0000 mg | ORAL_TABLET | Freq: Once | ORAL | Status: AC
  Administered 2020-11-11: 10 mg via ORAL
  Filled 2020-11-11: qty 1

## 2020-11-11 MED ORDER — HEPARIN SOD (PORK) LOCK FLUSH 100 UNIT/ML IV SOLN
500.0000 [IU] | Freq: Once | INTRAVENOUS | Status: AC | PRN
  Administered 2020-11-11: 500 [IU]
  Filled 2020-11-11: qty 5

## 2020-11-11 MED ORDER — DEXAMETHASONE SODIUM PHOSPHATE 10 MG/ML IJ SOLN
6.0000 mg | Freq: Once | INTRAMUSCULAR | Status: AC
  Administered 2020-11-11: 6 mg via INTRAVENOUS
  Filled 2020-11-11: qty 1

## 2020-11-11 MED ORDER — HEPARIN SOD (PORK) LOCK FLUSH 100 UNIT/ML IV SOLN
INTRAVENOUS | Status: AC
  Filled 2020-11-11: qty 5

## 2020-11-11 MED ORDER — SODIUM CHLORIDE 0.9 % IV SOLN
500.0000 mg/m2 | Freq: Once | INTRAVENOUS | Status: AC
  Administered 2020-11-11: 1100 mg via INTRAVENOUS
  Filled 2020-11-11: qty 40

## 2020-11-11 NOTE — Assessment & Plan Note (Addendum)
#   Adenocarcinoma the lung; stage IV; June 1st 2021- CT scan chest/a/p-shows no evidence of metastatic disease-STABLE.   # Proceed with Alimta chemotherapy. Labs today reviewed;  acceptable for treatment today; B12 every third cycle cycle. Will order CT scan now.   # Swelling in the hands/feet-grade 1 asymptomatic.? Alimta; monitor; STABLE  #Thoracic aneurysm-approximately 4.5 cm in size; on surveillance- STABLE.   # DISPOSITION:  # Alimta today # Follow up in 5 weeks MD-labs- cbc/cmp; alimta;CT C/A/P prior- Dr.B

## 2020-11-11 NOTE — Progress Notes (Signed)
Patient here for oncology follow-up appointment, expresses no complaints or concerns at this time.    

## 2020-11-11 NOTE — Progress Notes (Signed)
Stable at discharge 

## 2020-11-11 NOTE — Progress Notes (Signed)
Early OFFICE PROGRESS NOTE  Patient Care Team: [name], MD as PCP - General [facility])  Cancer Staging No matching staging information was found for the patient.   Oncology History Overview Note  # 2012- METASTATIC ADENO CA of LEFT LUNG [acinar pattern] s/p MED LN Bx [NEG- EGFR/K-ras/ALK mutation];[name]; [name]; CT JULY  2016- NED; JAN 2018- NED;   # AUG 2020-clinical trial closed; continue maintenance Alimta  # Thoracic aortic aneurysm-4.0 cm in 2016; currently 4.6 cm.  s/p referral to Dr. Faith Rogue.; MRI in 6 months planned.  ----------------------------------------------------------    DIAGNOSIS: [name]  STAGE: 4       ;GOALS: Palliative  CURRENT/MOST RECENT THERAPY '[ ]'  Alimta maintenance    Cancer of hilus of left lung ([place])  07/20/2016 Initial Diagnosis   Cancer of hilus of left lung ([place])   12/11/2019 -  Chemotherapy   The patient had dexamethasone (DECADRON) 4 MG tablet, 1 of 1 cycle, Start date: --, End date: -- PEMEtrexed (ALIMTA) 1,100 mg in sodium chloride 0.9 % 100 mL chemo infusion, 500 mg/m2 = 1,100 mg, Intravenous,  Once, 14 of 22 cycles Administration: 1,100 mg (12/11/2019), 1,100 mg (01/22/2020), 1,100 mg (02/12/2020), 1,100 mg (03/25/2020), 1,100 mg (01/01/2020), 1,100 mg (03/04/2020), 1,100 mg (04/15/2020), 1,100 mg (05/06/2020), 1,100 mg (07/08/2020), 1,100 mg (07/29/2020), 1,100 mg (08/19/2020), 1,100 mg (09/30/2020), 1,100 mg (10/21/2020)  for chemotherapy treatment.       INTERVAL HISTORY:  Mitchell Mosley 59 y.o.  male pleasant patient above history of metastatic adenocarcinoma the lung-NED currently on maintenance Alimta is here for follow-up.  Denies any shortness of breath or cough.  Denies any fevers or chills.  No headaches.  No nausea no vomiting.  Review of Systems  Constitutional: Negative for  chills, diaphoresis, fever, malaise/fatigue and weight loss.  HENT: Negative for nosebleeds and sore throat.   Eyes: Negative for double vision.  Respiratory: Negative for hemoptysis, shortness of breath and wheezing.   Cardiovascular: Positive for leg swelling. Negative for chest pain, palpitations and orthopnea.  Gastrointestinal: Negative for abdominal pain, blood in stool, constipation, diarrhea, heartburn, melena, nausea and vomiting.  Genitourinary: Negative for dysuria, frequency and urgency.  Musculoskeletal: Positive for joint pain.  Skin: Negative.  Negative for itching and rash.  Neurological: Negative for dizziness, tingling, focal weakness, weakness and headaches.  Endo/Heme/Allergies: Does not bruise/bleed easily.  Psychiatric/Behavioral: Negative for depression. The patient is not nervous/anxious and does not have insomnia.       PAST MEDICAL HISTORY :  Past Medical History:  Diagnosis Date  . Allergy   . Lung cancer ([place])   . Mild valvular heart disease Nov. 2014   per 2 D Echocardiogram done for persistent leg edema, monitored by cardiology  . Peripheral edema     PAST SURGICAL HISTORY :   Past Surgical History:  Procedure Laterality Date  . ELBOW SURGERY Right 2011  . Left anterior thoracotomy with biopsy  March 2012  . PORTACATH PLACEMENT  March 2012  . TONSILLECTOMY  as child    FAMILY HISTORY :   Family History  Problem Relation Age of Onset  . Leukemia Father   . Diabetes Mother     SOCIAL HISTORY:   Social History   Tobacco Use  . Smoking status: Never Smoker  . Smokeless tobacco: Never Used  Substance Use Topics  . Alcohol use: Yes  Alcohol/week: 0.0 standard drinks    Comment: "social drinker"   . Drug use: No    ALLERGIES:  is allergic to penicillins.  MEDICATIONS:  Current Outpatient Medications  Medication Sig Dispense Refill  . folic acid (FOLVITE) 1 MG tablet TAKE 1 TABLET BY MOUTH EVERY DAY 90 tablet 3  . furosemide (LASIX)  20 MG tablet TAKE 1 TABLET (20 MG TOTAL) BY MOUTH DAILY AS NEEDED FOR SWELLING 90 tablet 1  . meloxicam (MOBIC) 7.5 MG tablet Take 7.5 mg by mouth daily.     . Multiple Vitamin (MULTIVITAMIN) capsule Take 1 capsule by mouth daily. Reported on 02/24/2016    . NON FORMULARY Relief Factor with vitamin D and Turmeric    . Omega-3 Fatty Acids (FISH OIL) 1000 MG CAPS Take 1 capsule by mouth daily. Reported on 02/24/2016    . senna (SENOKOT) 8.6 MG tablet Take 1 tablet by mouth daily. Take as needed around time of chemotherapy    . sildenafil (VIAGRA) 100 MG tablet Take 1 tablet (100 mg total) by mouth daily as needed for erectile dysfunction. 20 tablet 11  . spironolactone (ALDACTONE) 25 MG tablet TAKE 1 TABLET BY MOUTH EVERY DAY 90 tablet 3  . tadalafil (CIALIS) 20 MG tablet Take one tablet every 2 days as needed 50 tablet 5  . vitamin B-12 (CYANOCOBALAMIN) 1000 MCG tablet Take 1,000 mcg by mouth daily.     No current facility-administered medications for this visit.   Facility-Administered Medications Ordered in Other Visits  Medication Dose Route Frequency Provider Last Rate Last Admin  . heparin lock flush 100 unit/mL  500 Units Intravenous Once Charlaine Dalton R, MD      . heparin lock flush 100 unit/mL  500 Units Intracatheter Once PRN [name], MD      . PEMEtrexed (ALIMTA) 1,100 mg in sodium chloride 0.9 % 100 mL chemo infusion  500 mg/m2 (Treatment Plan Recorded) Intravenous Once Charlaine Dalton R, [name], MD   10 mL at 05/27/15 1543  . sodium chloride 0.9 % injection 10 mL  10 mL Intracatheter PRN [name], MD   10 mL at 06/17/15 1510  . sodium chloride 0.9 % injection 10 mL  10 mL Intravenous PRN Choksi, Delorise Shiner, MD   10 mL at 07/08/15 1340  . sodium chloride flush (NS) 0.9 % injection 10 mL  10 mL Intravenous PRN Verlon Au, NP   10 mL at 09/30/20 1412    PHYSICAL  EXAMINATION: ECOG PERFORMANCE STATUS: 0 - Asymptomatic  BP 128/80 (BP Location: Left Arm, Patient Position: Sitting)   Pulse 88   Temp 98.2 F (36.8 C) (Tympanic)   Wt 212 lb 3.2 oz (96.3 kg)   SpO2 100%   BMI 30.45 kg/m   Filed Weights   11/11/20 1403  Weight: 212 lb 3.2 oz (96.3 kg)    Physical Exam HENT:     Head: Normocephalic and atraumatic.     Mouth/Throat:     Pharynx: No oropharyngeal exudate.  Eyes:     Pupils: Pupils are equal, round, and reactive to light.  Cardiovascular:     Rate and Rhythm: Normal rate and regular rhythm.  Pulmonary:     Effort: No respiratory distress.     Breath sounds: No wheezing.  Abdominal:     General: Bowel sounds are normal. There is no distension.  Palpations: Abdomen is soft. There is no mass.     Tenderness: There is no abdominal tenderness. There is no guarding or rebound.  Musculoskeletal:        General: No tenderness. Normal range of motion.     Cervical back: Normal range of motion and neck supple.     Comments: Grade 1 swelling in the legs bilaterally.  Skin:    General: Skin is warm.  Neurological:     Mental Status: He is alert and oriented to person, place, and time.  Psychiatric:        Mood and Affect: Affect normal.      LABORATORY DATA:  I have reviewed the data as listed    Component Value Date/Time   NA 136 11/11/2020 1349   NA 137 04/27/2020 1029   NA 138 12/10/2014 1341   K 3.8 11/11/2020 1349   K 3.6 12/10/2014 1341   CL 98 11/11/2020 1349   CL 102 12/10/2014 1341   CO2 27 11/11/2020 1349   CO2 30 12/10/2014 1341   GLUCOSE 170 (H) 11/11/2020 1349   GLUCOSE 160 (H) 12/10/2014 1341   BUN 14 11/11/2020 1349   BUN 15 04/27/2020 1029   BUN 21 (H) 12/10/2014 1341   CREATININE 1.01 11/11/2020 1349   CREATININE 1.10 03/25/2015 1453   CALCIUM 8.9 11/11/2020 1349   CALCIUM 8.4 (L) 12/10/2014 1341   PROT 7.1 11/11/2020 1349   PROT 7.3 04/27/2020 1029   PROT 6.6 12/10/2014 1341   ALBUMIN 4.1  11/11/2020 1349   ALBUMIN 4.7 04/27/2020 1029   ALBUMIN 3.4 12/10/2014 1341   AST 23 11/11/2020 1349   AST 17 12/10/2014 1341   ALT 15 11/11/2020 1349   ALT 23 12/10/2014 1341   ALKPHOS 42 11/11/2020 1349   ALKPHOS 50 12/10/2014 1341   BILITOT 0.7 11/11/2020 1349   BILITOT 0.2 04/27/2020 1029   BILITOT 0.5 12/10/2014 1341   GFRNONAA >60 11/11/2020 1349   GFRNONAA >60 03/25/2015 1453   GFRAA >60 08/19/2020 1324   GFRAA >60 03/25/2015 1453    No results found for: SPEP, UPEP  Lab Results  Component Value Date   WBC 5.7 11/11/2020   NEUTROABS 3.2 11/11/2020   HGB 14.2 11/11/2020   HCT 41.7 11/11/2020   MCV 92.9 11/11/2020   PLT 165 11/11/2020      Chemistry      Component Value Date/Time   NA 136 11/11/2020 1349   NA 137 04/27/2020 1029   NA 138 12/10/2014 1341   K 3.8 11/11/2020 1349   K 3.6 12/10/2014 1341   CL 98 11/11/2020 1349   CL 102 12/10/2014 1341   CO2 27 11/11/2020 1349   CO2 30 12/10/2014 1341   BUN 14 11/11/2020 1349   BUN 15 04/27/2020 1029   BUN 21 (H) 12/10/2014 1341   CREATININE 1.01 11/11/2020 1349   CREATININE 1.10 03/25/2015 1453      Component Value Date/Time   CALCIUM 8.9 11/11/2020 1349   CALCIUM 8.4 (L) 12/10/2014 1341   ALKPHOS 42 11/11/2020 1349   ALKPHOS 50 12/10/2014 1341   AST 23 11/11/2020 1349   AST 17 12/10/2014 1341   ALT 15 11/11/2020 1349   ALT 23 12/10/2014 1341   BILITOT 0.7 11/11/2020 1349   BILITOT 0.2 04/27/2020 1029   BILITOT 0.5 12/10/2014 1341       RADIOGRAPHIC STUDIES: I have personally reviewed the radiological images as listed and agreed with the findings in the report. No  results found.   ASSESSMENT & PLAN:  Cancer of hilus of left lung [facility]) # Adenocarcinoma the lung; stage IV; June 1st 2021- CT scan chest/a/p-shows no evidence of metastatic disease-STABLE.   # Proceed with Alimta chemotherapy. Labs today reviewed;  acceptable for treatment today; B12 every third cycle cycle. Will order CT scan now.    # Swelling in the hands/feet-grade 1 asymptomatic.? Alimta; monitor; STABLE  #Thoracic aneurysm-approximately 4.5 cm in size; on surveillance- STABLE.   # DISPOSITION:  # Alimta today # Follow up in 5 weeks MD-labs- cbc/cmp; alimta;CT C/A/P prior- Dr.B     Orders Placed This Encounter  Procedures  . CT CHEST ABDOMEN PELVIS W CONTRAST    Standing Status:   Future    Standing Expiration Date:   11/11/2021    Order Specific Question:   Preferred imaging location?    Answer:   [facility]    Order Specific Question:   Radiology Contrast Protocol - do NOT remove file path    Answer:   [email or web address]   All questions were answered. The patient knows to call the clinic with any problems, questions or concerns.      [name], MD 11/11/2020 2:58 PM

## 2020-11-17 ENCOUNTER — Encounter: Payer: Self-pay | Admitting: Physician Assistant

## 2020-11-17 ENCOUNTER — Telehealth (INDEPENDENT_AMBULATORY_CARE_PROVIDER_SITE_OTHER): Payer: BC Managed Care – PPO | Admitting: Physician Assistant

## 2020-11-17 DIAGNOSIS — J069 Acute upper respiratory infection, unspecified: Secondary | ICD-10-CM | POA: Diagnosis not present

## 2020-11-17 MED ORDER — DOXYCYCLINE HYCLATE 100 MG PO TABS: 100.0000 mg | ORAL_TABLET | Freq: Two times a day (BID) | ORAL | 0 refills | Status: AC

## 2020-11-17 NOTE — Progress Notes (Signed)
MyChart Video Visit    Virtual Visit via Video Note   This visit type was conducted due to national recommendations for restrictions regarding the COVID-19 Pandemic (e.g. social distancing) in an effort to limit this patient's exposure and mitigate transmission in our community. This patient is at least at moderate risk for complications without adequate follow up. This format is felt to be most appropriate for this patient at this time. Physical exam was limited by quality of the video and audio technology used for the visit.   Patient location: Home Provider location: Office   I discussed the limitations of evaluation and management by telemedicine and the availability of in person appointments. The patient expressed understanding and agreed to proceed.  Patient: Mitchell Mosley   DOB: July 17, 1961   59 y.o. Male  MRN: [id number] Visit Date: 11/17/2020  Today's healthcare provider: Trinna Post, PA-C   Chief Complaint  Patient presents with  . Cough  I,Porsha C McClurkin,acting as a scribe for Trinna Post, PA-C.,have documented all relevant documentation on the behalf of Trinna Post, PA-C,as directed by  Trinna Post, PA-C while in the presence of Trinna Post, PA-C.  Subjective    Cough This is a new problem. The current episode started in the past 7 days. The problem has been unchanged. The cough is productive of sputum. Associated symptoms include nasal congestion and rhinorrhea. Pertinent negatives include no chills, ear pain, fever, headaches, postnasal drip, sore throat, shortness of breath or wheezing. He has tried OTC cough suppressant for the symptoms. The treatment provided mild relief.    He has had one day of symptoms.      Medications: Outpatient Medications Prior to Visit  Medication Sig  . folic acid (FOLVITE) 1 MG tablet TAKE 1 TABLET BY MOUTH EVERY DAY  . furosemide (LASIX) 20 MG tablet TAKE 1 TABLET (20 MG TOTAL) BY MOUTH DAILY AS  NEEDED FOR SWELLING  . meloxicam (MOBIC) 7.5 MG tablet Take 7.5 mg by mouth daily.   . Multiple Vitamin (MULTIVITAMIN) capsule Take 1 capsule by mouth daily. Reported on 02/24/2016  . NON FORMULARY Relief Factor with vitamin D and Turmeric  . Omega-3 Fatty Acids (FISH OIL) 1000 MG CAPS Take 1 capsule by mouth daily. Reported on 02/24/2016  . senna (SENOKOT) 8.6 MG tablet Take 1 tablet by mouth daily. Take as needed around time of chemotherapy  . sildenafil (VIAGRA) 100 MG tablet Take 1 tablet (100 mg total) by mouth daily as needed for erectile dysfunction.  Marland Kitchen spironolactone (ALDACTONE) 25 MG tablet TAKE 1 TABLET BY MOUTH EVERY DAY  . tadalafil (CIALIS) 20 MG tablet Take one tablet every 2 days as needed  . vitamin B-12 (CYANOCOBALAMIN) 1000 MCG tablet Take 1,000 mcg by mouth daily.   Facility-Administered Medications Prior to Visit  Medication Dose Route Frequency Provider  . heparin lock flush 100 unit/mL  500 Units Intravenous Once Charlaine Dalton R, MD  . sodium chloride 0.9 % injection 10 mL  10 mL Intracatheter PRN [name], MD  . sodium chloride 0.9 % injection 10 mL  10 mL Intracatheter PRN [name], MD  . sodium chloride 0.9 % injection 10 mL  10 mL Intravenous PRN Choksi, Janak, MD  . sodium chloride flush (NS) 0.9 % injection 10 mL  10 mL Intravenous PRN Verlon Au, NP    Review of Systems  Constitutional: Negative for appetite change, chills, fatigue and fever.  HENT: Positive for congestion, rhinorrhea  and sinus pressure. Negative for ear pain, postnasal drip, sinus pain, sneezing and sore throat.   Respiratory: Positive for cough. Negative for chest tightness, shortness of breath and wheezing.   Neurological: Negative for weakness and headaches.      Objective    There were no vitals taken for this visit.   Physical Exam Constitutional:      Appearance: Normal appearance.  Pulmonary:     Effort: Pulmonary effort is normal. No respiratory  distress.  Neurological:     Mental Status: He is alert.  Psychiatric:        Mood and Affect: Mood normal.        Behavior: Behavior normal.        Assessment & Plan    1. Upper respiratory tract infection, unspecified type  Counseled regarding signs and symptoms of viral and bacterial respiratory infections. Advised to call or return for additional evaluation if he develops any sign of bacterial infection, or if current symptoms last longer than 10 days.   Advised patient to allow his body a little longer to handle this infection as one day of symptoms likely is viral. Will test for COVID. Will send in abx to cover for worsening over the weekend. Counseled on OTC treatment including mucinex, delysm.   - doxycycline (VIBRA-TABS) 100 MG tablet; Take 1 tablet (100 mg total) by mouth 2 (two) times daily for 7 days.  Dispense: 14 tablet; Refill: 0 - COVID-19, Flu A+B and RSV   No follow-ups on file.     I discussed the assessment and treatment plan with the patient. The patient was provided an opportunity to ask questions and all were answered. The patient agreed with the plan and demonstrated an understanding of the instructions.   The patient was advised to call back or seek an in-person evaluation if the symptoms worsen or if the condition fails to improve as anticipated.  ITrinna Post, PA-C, have reviewed all documentation for this visit. The documentation on 11/17/20 for the exam, diagnosis, procedures, and orders are all accurate and complete.  The entirety of the information documented in the History of Present Illness, Review of Systems and Physical Exam were personally obtained by me. Portions of this information were initially documented by [facility] and reviewed by me for thoroughness and accuracy.    Paulene Floor [facility] [phone] (phone) [phone] (fax)  [place]

## 2020-11-19 LAB — COVID-19, FLU A+B AND RSV
Influenza A, NAA: NOT DETECTED
Influenza B, NAA: NOT DETECTED
RSV, NAA: NOT DETECTED
SARS-CoV-2, NAA: NOT DETECTED

## 2020-11-19 LAB — SPECIMEN STATUS REPORT

## 2020-12-09 ENCOUNTER — Ambulatory Visit: Admission: RE | Admit: 2020-12-09 | Payer: BC Managed Care – PPO | Source: Ambulatory Visit

## 2020-12-12 ENCOUNTER — Ambulatory Visit
Admission: RE | Admit: 2020-12-12 | Discharge: 2020-12-12 | Disposition: A | Discharge status: Home / Self Care | Payer: BC Managed Care – PPO | Source: Ambulatory Visit | Attending: Internal Medicine | Admitting: Internal Medicine

## 2020-12-12 ENCOUNTER — Other Ambulatory Visit: Payer: Self-pay

## 2020-12-12 DIAGNOSIS — C3402 Malignant neoplasm of left main bronchus: Secondary | ICD-10-CM | POA: Insufficient documentation

## 2020-12-12 DIAGNOSIS — I712 Thoracic aortic aneurysm, without rupture: Secondary | ICD-10-CM | POA: Diagnosis not present

## 2020-12-12 DIAGNOSIS — C3490 Malignant neoplasm of unspecified part of unspecified bronchus or lung: Secondary | ICD-10-CM | POA: Diagnosis not present

## 2020-12-12 DIAGNOSIS — I700 Atherosclerosis of aorta: Secondary | ICD-10-CM | POA: Diagnosis not present

## 2020-12-12 MED ORDER — IOHEXOL 300 MG/ML  SOLN
85.0000 mL | Freq: Once | INTRAMUSCULAR | Status: AC | PRN
  Administered 2020-12-12: 85 mL via INTRAVENOUS

## 2020-12-13 ENCOUNTER — Encounter: Payer: Self-pay | Admitting: Internal Medicine

## 2020-12-15 ENCOUNTER — Other Ambulatory Visit: Payer: BC Managed Care – PPO

## 2020-12-15 ENCOUNTER — Other Ambulatory Visit: Payer: Self-pay

## 2020-12-15 DIAGNOSIS — Z20822 Contact with and (suspected) exposure to covid-19: Secondary | ICD-10-CM

## 2020-12-16 ENCOUNTER — Inpatient Hospital Stay: Payer: BC Managed Care – PPO

## 2020-12-16 ENCOUNTER — Inpatient Hospital Stay: Payer: BC Managed Care – PPO | Admitting: Internal Medicine

## 2020-12-17 LAB — NOVEL CORONAVIRUS, NAA: SARS-CoV-2, NAA: NOT DETECTED

## 2020-12-17 LAB — SARS-COV-2, NAA 2 DAY TAT

## 2020-12-20 ENCOUNTER — Telehealth: Payer: Self-pay | Admitting: Cardiothoracic Surgery

## 2020-12-20 ENCOUNTER — Telehealth: Payer: Self-pay

## 2020-12-20 DIAGNOSIS — Z1152 Encounter for screening for COVID-19: Secondary | ICD-10-CM

## 2020-12-20 NOTE — Telephone Encounter (Signed)
Outbound call made to the pt after receiving a MyChart request to schedule his semi-annual visit w/Dr. Genevive Bi (01/31-2/21).  Mitchell Mosley is aware that he will need to be contacted w/scheduling options closer to mid-February, as Dr. Genevive Bi is currently out of the office while recovering from his own recent surgery & his actual return date is still unknown. Mitchell Mosley acknowledged understanding & thanked.    The appt request note will be routed to the recall box for Feb '22.  Thank you

## 2020-12-20 NOTE — Telephone Encounter (Signed)
Patient showed up at [facility], no order was done. Patient advised to wait down stairs for me to get order and test ready. sd  Copied from [place] [id number]. Topic: General - Other >> Dec 20, 2020 12:40 PM Leward Quan A wrote: Reason for CRM: Patient called in to inform Dr Rosanna Randy that he had a neighbor that tested positive for covid. Patient have no symptoms just want to know what to do. Please advise Ph# [phone]

## 2020-12-21 NOTE — Telephone Encounter (Signed)
Patient reports that yes, it was a close exposure. He feels fine. However, day 5 will be on Friday. He wants to know if he still feels well by then, does he still need to test? And is it ok for him to go to work until then? Yesterday he did a home test and it was negative. Please advise. Thanks!

## 2020-12-21 NOTE — Telephone Encounter (Signed)
Patient called again wanting information from Dr. Rosanna Randy or his assitant. He can be reached at [phone]. Please advise

## 2020-12-21 NOTE — Telephone Encounter (Signed)
Please reivew.

## 2020-12-21 NOTE — Telephone Encounter (Signed)
Patient reports he does want to test for COVID. Patient denies any symptoms.

## 2020-12-21 NOTE — Telephone Encounter (Signed)
We can test him tomorrow if he wants.  If he does not want to test I would just recommend wearing a mask for another 5 days at work and when out and about.

## 2020-12-21 NOTE — Telephone Encounter (Signed)
Has he had close exposure to the neighbor?  If asymptomatic would consider testing 5 days after exposure.  If symptomatic would test now.

## 2020-12-22 ENCOUNTER — Encounter: Payer: Self-pay | Admitting: Internal Medicine

## 2020-12-22 DIAGNOSIS — Z1152 Encounter for screening for COVID-19: Secondary | ICD-10-CM | POA: Diagnosis not present

## 2020-12-22 NOTE — Telephone Encounter (Signed)
Tried calling patient, no answer and no vm. Will try again later. 

## 2020-12-22 NOTE — Telephone Encounter (Signed)
Please order COVID test for today and instruct patient on specifics of the testing.  Thank you

## 2020-12-24 LAB — SARS-COV-2, NAA 2 DAY TAT

## 2020-12-24 LAB — NOVEL CORONAVIRUS, NAA: SARS-CoV-2, NAA: NOT DETECTED

## 2020-12-26 NOTE — Telephone Encounter (Signed)
Please advise 

## 2020-12-26 NOTE — Telephone Encounter (Signed)
Pt states last Thurs he was around a covid positive person.  He did home test today-negative.    Pt states he still has head cold, feels like his "head is in a boat" Pt would like to know what you recommend?

## 2020-12-26 NOTE — Telephone Encounter (Signed)
I would do a PCR here tomorrow for this patient with history of lung cancer.

## 2020-12-27 ENCOUNTER — Other Ambulatory Visit: Payer: BC Managed Care – PPO

## 2020-12-27 ENCOUNTER — Other Ambulatory Visit: Payer: Self-pay | Admitting: *Deleted

## 2020-12-27 DIAGNOSIS — Z1152 Encounter for screening for COVID-19: Secondary | ICD-10-CM

## 2020-12-27 NOTE — Telephone Encounter (Signed)
Patient was advised and test ordered.

## 2020-12-29 ENCOUNTER — Encounter: Payer: Self-pay | Admitting: Family Medicine

## 2020-12-29 ENCOUNTER — Other Ambulatory Visit: Payer: Self-pay | Admitting: Family Medicine

## 2020-12-29 DIAGNOSIS — U071 COVID-19: Secondary | ICD-10-CM

## 2020-12-29 LAB — NOVEL CORONAVIRUS, NAA: SARS-CoV-2, NAA: DETECTED — AB

## 2020-12-29 LAB — SARS-COV-2, NAA 2 DAY TAT

## 2020-12-29 NOTE — Progress Notes (Signed)
Exposed 1 week ago, symptoms for 4 days.  Now Covid positive.  Head and chest congestion.  Feels like he is more congested in his chest recently.  No fever or myalgias.

## 2020-12-30 ENCOUNTER — Telehealth: Payer: Self-pay | Admitting: Pharmacist

## 2020-12-30 ENCOUNTER — Other Ambulatory Visit: Payer: Self-pay | Admitting: Physician Assistant

## 2020-12-30 ENCOUNTER — Ambulatory Visit (HOSPITAL_COMMUNITY)
Admission: RE | Admit: 2020-12-30 | Discharge: 2020-12-30 | Disposition: A | Discharge status: Home / Self Care | Payer: BC Managed Care – PPO | Source: Ambulatory Visit | Attending: Pulmonary Disease | Admitting: Pulmonary Disease

## 2020-12-30 ENCOUNTER — Encounter: Payer: Self-pay | Admitting: Physician Assistant

## 2020-12-30 ENCOUNTER — Telehealth: Payer: Self-pay | Admitting: Physician Assistant

## 2020-12-30 ENCOUNTER — Other Ambulatory Visit: Payer: Self-pay | Admitting: Oncology

## 2020-12-30 DIAGNOSIS — I10 Essential (primary) hypertension: Secondary | ICD-10-CM

## 2020-12-30 DIAGNOSIS — U071 COVID-19: Secondary | ICD-10-CM | POA: Insufficient documentation

## 2020-12-30 DIAGNOSIS — D849 Immunodeficiency, unspecified: Secondary | ICD-10-CM | POA: Insufficient documentation

## 2020-12-30 DIAGNOSIS — I712 Thoracic aortic aneurysm, without rupture, unspecified: Secondary | ICD-10-CM | POA: Insufficient documentation

## 2020-12-30 DIAGNOSIS — C3490 Malignant neoplasm of unspecified part of unspecified bronchus or lung: Secondary | ICD-10-CM | POA: Insufficient documentation

## 2020-12-30 DIAGNOSIS — Z6825 Body mass index (BMI) 25.0-25.9, adult: Secondary | ICD-10-CM | POA: Insufficient documentation

## 2020-12-30 DIAGNOSIS — C3402 Malignant neoplasm of left main bronchus: Secondary | ICD-10-CM

## 2020-12-30 MED ORDER — NIRMATRELVIR/RITONAVIR (PAXLOVID)TABLET: 3.0000 | ORAL_TABLET | Freq: Two times a day (BID) | ORAL | 0 refills | Status: AC

## 2020-12-30 MED ORDER — EPINEPHRINE 0.3 MG/0.3ML IJ SOAJ: 0.3000 mg | Freq: Once | INTRAMUSCULAR | Status: DC | PRN

## 2020-12-30 MED ORDER — SODIUM CHLORIDE 0.9 % IV SOLN: INTRAVENOUS | Status: DC | PRN

## 2020-12-30 MED ORDER — HEPARIN SOD (PORK) LOCK FLUSH 100 UNIT/ML IV SOLN
500.0000 [IU] | INTRAVENOUS | Status: AC | PRN
  Administered 2020-12-30: 500 [IU]

## 2020-12-30 MED ORDER — ALBUTEROL SULFATE HFA 108 (90 BASE) MCG/ACT IN AERS: 2.0000 | INHALATION_SPRAY | Freq: Once | RESPIRATORY_TRACT | Status: DC | PRN

## 2020-12-30 MED ORDER — METHYLPREDNISOLONE SODIUM SUCC 125 MG IJ SOLR: 125.0000 mg | Freq: Once | INTRAMUSCULAR | Status: DC | PRN

## 2020-12-30 MED ORDER — DIPHENHYDRAMINE HCL 50 MG/ML IJ SOLN: 50.0000 mg | Freq: Once | INTRAMUSCULAR | Status: DC | PRN

## 2020-12-30 MED ORDER — SOTROVIMAB 500 MG/8ML IV SOLN
500.0000 mg | Freq: Once | INTRAVENOUS | Status: AC
  Administered 2020-12-30: 500 mg via INTRAVENOUS

## 2020-12-30 MED ORDER — FAMOTIDINE IN NACL 20-0.9 MG/50ML-% IV SOLN: 20.0000 mg | Freq: Once | INTRAVENOUS | Status: DC | PRN

## 2020-12-30 NOTE — Progress Notes (Signed)
Diagnosis: COVID-19  Physician: Dr. Patrick Wright  Procedure: Covid Infusion Clinic Med: Sotrovimab infusion - Provided patient with sotrovimab fact sheet for patients, parents, and caregivers prior to infusion.   Complications: No immediate complications noted  Discharge: Discharged home    

## 2020-12-30 NOTE — Progress Notes (Signed)
Patient reviewed Fact Sheet for Patients, Parents, and Caregivers for Emergency Use Authorization (EUA) of sotrovimab for the Treatment of Coronavirus. Patient also reviewed and is agreeable to the estimated cost of treatment. Patient is agreeable to proceed.   

## 2020-12-30 NOTE — Telephone Encounter (Signed)
Patient was prescribed oral covid treatment Paxlovid and treatment note was reviewed. Medication has been received by [place] and reviewed for appropriateness.  Drug Interactions were reviewed and patient was told to hold viagra while taking Paxlovid.  Dosage Adjustment was not needed.  Serum creatinine is 1.01 from most recent labs collected and CrCl was calculated to be 73ml/min taking both height and weight into account.  Delivery Method: Patient will pick up medication at [facility] today before infusion appointment.  Patient contacted for counseling on 12/30/2020 and verbalized understanding.   Pick-Up Date: Medication was delivered to the patient's car on 12/30/2020 at 11:30am.   Letta Median 12/30/2020, 9:58 AM [place] Pharmacist Phone# [phone]

## 2020-12-30 NOTE — Progress Notes (Signed)
I connected by phone with Mitchell Mosley on 12/30/2020 at 8:58 AM to discuss the potential use of a new treatment for mild to moderate COVID-19 viral infection in non-hospitalized patients.  This patient is a 60 y.o. male that meets the FDA criteria for Emergency Use Authorization of COVID monoclonal antibody sotrovimab.  Has a (+) direct SARS-CoV-2 viral test result  Has mild or moderate COVID-19   Is NOT hospitalized due to COVID-19  Is within 10 days of symptom onset  Has at least one of the high risk factor(s) for progression to severe COVID-19 and/or hospitalization as defined in EUA.  Specific high risk criteria : BMI > 25, Immunosuppressive Disease or Treatment, Cardiovascular disease or hypertension and Other high risk medical condition per CDC:  vaccinated but not boosted   I have spoken and communicated the following to the patient or parent/caregiver regarding COVID monoclonal antibody treatment:  1. FDA has authorized the emergency use for the treatment of mild to moderate COVID-19 in adults and pediatric patients with positive results of direct SARS-CoV-2 viral testing who are 67 years of age and older weighing at least 40 kg, and who are at high risk for progressing to severe COVID-19 and/or hospitalization.  2. The significant known and potential risks and benefits of COVID monoclonal antibody, and the extent to which such potential risks and benefits are unknown.  3. Information on available alternative treatments and the risks and benefits of those alternatives, including clinical trials.  4. Patients treated with COVID monoclonal antibody should continue to self-isolate and use infection control measures (e.g., wear mask, isolate, social distance, avoid sharing personal items, clean and disinfect "high touch" surfaces, and frequent handwashing) according to CDC guidelines.   5. The patient or parent/caregiver has the option to accept or refuse COVID monoclonal antibody  treatment.  After reviewing this information with the patient, the patient has agreed to receive one of the available covid 19 monoclonal antibodies and will be provided an appropriate fact sheet prior to infusion.   Sx onset 1/24. Vaccinated but not boosted. + in epic. Set up for infusion today.   Angelena Form, PA-C 12/30/2020 8:58 AM

## 2020-12-30 NOTE — Telephone Encounter (Signed)
Outpatient Oral COVID Treatment Note  I connected with Mitchell Mosley on 12/30/2020/9:09 AM by telephone and verified that I am speaking with the correct person using two identifiers.  I discussed the limitations, risks, security, and privacy concerns of performing an evaluation and management service by telephone and the availability of in person appointments. I also discussed with the patient that there may be a patient responsible charge related to this service. The patient expressed understanding and agreed to proceed.  Patient location: home  Provider location: office  Diagnosis: COVID-19 infection  Purpose of visit: Discussion of potential use of Molnupiravir or Paxlovid, a new treatment for mild to moderate COVID-19 viral infection in non-hospitalized patients.   Subjective: Patient is a 60 y.o. male who has been diagnosed with COVID 19 viral infection.  Their symptoms began on 1/24 with cough, weakness and fatigue.    Past Medical History:  Diagnosis Date  . Allergy   . Lung cancer ([place])   . Mild valvular heart disease Nov. 2014   per 2 D Echocardiogram done for persistent leg edema, monitored by cardiology  . Peripheral edema   . Thoracic aortic aneurysm (TAA) ([facility])     Allergies  Allergen Reactions  . Penicillins     Reaction and severity unknown     Current Outpatient Medications:  .  folic acid (FOLVITE) 1 MG tablet, TAKE 1 TABLET BY MOUTH EVERY DAY, Disp: 90 tablet, Rfl: 3 .  furosemide (LASIX) 20 MG tablet, TAKE 1 TABLET (20 MG TOTAL) BY MOUTH DAILY AS NEEDED FOR SWELLING, Disp: 90 tablet, Rfl: 1 .  meloxicam (MOBIC) 7.5 MG tablet, Take 7.5 mg by mouth daily. , Disp: , Rfl:  .  Multiple Vitamin (MULTIVITAMIN) capsule, Take 1 capsule by mouth daily. Reported on 02/24/2016, Disp: , Rfl:  .  nirmatrelvir/ritonavir EUA (PAXLOVID) TABS, Take 3 tablets by mouth 2 (two) times daily for 5 days. Take nirmatrelvir (150 mg) two tablet(s) twice daily for 5 days and ritonavir (100  mg) one tablet twice daily for 5 days., Disp: 30 tablet, Rfl: 0 .  NON FORMULARY, Relief Factor with vitamin D and Turmeric, Disp: , Rfl:  .  Omega-3 Fatty Acids (FISH OIL) 1000 MG CAPS, Take 1 capsule by mouth daily. Reported on 02/24/2016, Disp: , Rfl:  .  senna (SENOKOT) 8.6 MG tablet, Take 1 tablet by mouth daily. Take as needed around time of chemotherapy, Disp: , Rfl:  .  sildenafil (VIAGRA) 100 MG tablet, Take 1 tablet (100 mg total) by mouth daily as needed for erectile dysfunction., Disp: 20 tablet, Rfl: 11 .  spironolactone (ALDACTONE) 25 MG tablet, TAKE 1 TABLET BY MOUTH EVERY DAY, Disp: 90 tablet, Rfl: 3 .  tadalafil (CIALIS) 20 MG tablet, Take one tablet every 2 days as needed, Disp: 50 tablet, Rfl: 5 .  vitamin B-12 (CYANOCOBALAMIN) 1000 MCG tablet, Take 1,000 mcg by mouth daily., Disp: , Rfl:  No current facility-administered medications for this visit.  Facility-Administered Medications Ordered in Other Visits:  .  heparin lock flush 100 unit/mL, 500 Units, Intravenous, Once, Brahmanday, Govinda R, MD .  sodium chloride 0.9 % injection 10 mL, 10 mL, Intracatheter, PRN, [name], MD, 10 mL at 05/27/15 1543 .  sodium chloride 0.9 % injection 10 mL, 10 mL, Intracatheter, PRN, [name], MD, 10 mL at 06/17/15 1510 .  sodium chloride 0.9 % injection 10 mL, 10 mL, Intravenous, PRN, Choksi, Janak, MD, 10 mL at 07/08/15 1340 .  sodium chloride flush (NS)  0.9 % injection 10 mL, 10 mL, Intravenous, PRN, Verlon Au, NP, 10 mL at 09/30/20 1412  Objective: Patient sound stable on the phone.  They are in no apparent distress.  Breathing is non labored.  Mood and behavior are normal.  Laboratory Data:  Recent Results (from the past 2160 hour(s))  Comprehensive metabolic panel     Status: Abnormal   Collection Time: 10/21/20  1:53 PM  Result Value Ref Range   Sodium 135 135 - 145 mmol/L   Potassium 3.7 3.5 - 5.1 mmol/L   Chloride 99 98 - 111 mmol/L   CO2 27 22 - 32 mmol/L    Glucose, Bld 133 (H) 70 - 99 mg/dL    Comment: Glucose reference range applies only to samples taken after fasting for at least 8 hours.   BUN 22 (H) 6 - 20 mg/dL   Creatinine, Ser 1.17 0.61 - 1.24 mg/dL   Calcium 9.3 8.9 - 10.3 mg/dL   Total Protein 7.2 6.5 - 8.1 g/dL   Albumin 3.8 3.5 - 5.0 g/dL   AST 23 15 - 41 U/L   ALT 16 0 - 44 U/L   Alkaline Phosphatase 45 38 - 126 U/L   Total Bilirubin 0.9 0.3 - 1.2 mg/dL   GFR, Estimated >60 >60 mL/min    Comment: (NOTE) Calculated using the CKD-EPI Creatinine Equation (2021)    Anion gap 9 5 - 15    Comment: Performed at [facility], [address] [place], [place] [address]  CBC with Differential     Status: None   Collection Time: 10/21/20  1:53 PM  Result Value Ref Range   WBC 5.5 4.0 - 10.5 K/uL   RBC 4.39 4.22 - 5.81 MIL/uL   Hemoglobin 13.9 13.0 - 17.0 g/dL   HCT 40.1 39.0 - 52.0 %   MCV 91.3 80.0 - 100.0 fL   MCH 31.7 26.0 - 34.0 pg   MCHC 34.7 30.0 - 36.0 g/dL   RDW 12.4 11.5 - 15.5 %   Platelets 151 150 - 400 K/uL   nRBC 0.0 0.0 - 0.2 %   Neutrophils Relative % 53 %   Neutro Abs 2.9 1.7 - 7.7 K/uL   Lymphocytes Relative 36 %   Lymphs Abs 2.0 0.7 - 4.0 K/uL   Monocytes Relative 9 %   Monocytes Absolute 0.5 0.1 - 1.0 K/uL   Eosinophils Relative 2 %   Eosinophils Absolute 0.1 0.0 - 0.5 K/uL   Basophils Relative 0 %   Basophils Absolute 0.0 0.0 - 0.1 K/uL   Immature Granulocytes 0 %   Abs Immature Granulocytes 0.01 0.00 - 0.07 K/uL    Comment: Performed at [facility], [address] [place], [place] [address]  CBC with Differential     Status: None   Collection Time: 11/11/20  1:49 PM  Result Value Ref Range   WBC 5.7 4.0 - 10.5 K/uL   RBC 4.49 4.22 - 5.81 MIL/uL   Hemoglobin 14.2 13.0 - 17.0 g/dL   HCT 41.7 39.0 - 52.0 %   MCV 92.9 80.0 - 100.0 fL   MCH 31.6 26.0 - 34.0 pg   MCHC 34.1 30.0 - 36.0 g/dL   RDW 13.1 11.5 - 15.5 %   Platelets 165 150 - 400 K/uL   nRBC 0.0 0.0 - 0.2 %    Neutrophils Relative % 55 %   Neutro Abs 3.2 1.7 - 7.7 K/uL   Lymphocytes Relative 31 %  Lymphs Abs 1.8 0.7 - 4.0 K/uL   Monocytes Relative 11 %   Monocytes Absolute 0.6 0.1 - 1.0 K/uL   Eosinophils Relative 2 %   Eosinophils Absolute 0.1 0.0 - 0.5 K/uL   Basophils Relative 1 %   Basophils Absolute 0.0 0.0 - 0.1 K/uL   Immature Granulocytes 0 %   Abs Immature Granulocytes 0.01 0.00 - 0.07 K/uL    Comment: Performed at [facility], [address] [place], [place] [address]  Comprehensive metabolic panel     Status: Abnormal   Collection Time: 11/11/20  1:49 PM  Result Value Ref Range   Sodium 136 135 - 145 mmol/L   Potassium 3.8 3.5 - 5.1 mmol/L   Chloride 98 98 - 111 mmol/L   CO2 27 22 - 32 mmol/L   Glucose, Bld 170 (H) 70 - 99 mg/dL    Comment: Glucose reference range applies only to samples taken after fasting for at least 8 hours.   BUN 14 6 - 20 mg/dL   Creatinine, Ser 1.01 0.61 - 1.24 mg/dL   Calcium 8.9 8.9 - 10.3 mg/dL   Total Protein 7.1 6.5 - 8.1 g/dL   Albumin 4.1 3.5 - 5.0 g/dL   AST 23 15 - 41 U/L   ALT 15 0 - 44 U/L   Alkaline Phosphatase 42 38 - 126 U/L   Total Bilirubin 0.7 0.3 - 1.2 mg/dL   GFR, Estimated >60 >60 mL/min    Comment: (NOTE) Calculated using the CKD-EPI Creatinine Equation (2021)    Anion gap 11 5 - 15    Comment: Performed at [facility], [address] [place], [place] [address]  COVID-19, Flu A+B and RSV     Status: None   Collection Time: 11/17/20 12:00 AM  Result Value Ref Range   SARS-CoV-2, NAA Not Detected Not Detected    Comment: This nucleic acid amplification test was developed and its performance characteristics determined by [employer]. Nucleic acid amplification tests include RT-PCR and TMA. This test has not been FDA cleared or approved. This test has been authorized by FDA under an Emergency Use Authorization (EUA). This test is only authorized for the duration of time the declaration that  circumstances exist justifying the authorization of the emergency use of in vitro diagnostic tests for detection of SARS-CoV-2 virus and/or diagnosis of COVID-19 infection under section 564(b)(1) of the Act, 21 U.S.C. 676PPJ-0(D) (1), unless the authorization is terminated or revoked sooner. When diagnostic testing is negative, the possibility of a false negative result should be considered in the context of a patient's recent exposures and the presence of clinical signs and symptoms consistent with COVID-19. An individual without symptoms of COVID-19 and who is not shedding SARS-CoV-2 virus wo uld expect to have a negative (not detected) result in this assay.    Influenza A, NAA Not Detected Not Detected   Influenza B, NAA Not Detected Not Detected   RSV, NAA Not Detected Not Detected  Specimen status report     Status: None   Collection Time: 11/17/20 12:00 AM  Result Value Ref Range   specimen status report Comment     Comment: Please note Please note The date and/or time of collection was not indicated on the requisition as required by state and federal law.  The date of receipt of the specimen was used as the collection date if not supplied.   Novel Coronavirus, NAA ([facility])     Status: None   Collection  Time: 12/15/20  6:26 PM   Specimen: Nasopharyngeal(NP) swabs in vial transport medium   Nasopharynge  Screenin  Result Value Ref Range   SARS-CoV-2, NAA Not Detected Not Detected    Comment: This nucleic acid amplification test was developed and its performance characteristics determined by [employer]. Nucleic acid amplification tests include RT-PCR and TMA. This test has not been FDA cleared or approved. This test has been authorized by FDA under an Emergency Use Authorization (EUA). This test is only authorized for the duration of time the declaration that circumstances exist justifying the authorization of the emergency use of in vitro diagnostic tests for  detection of SARS-CoV-2 virus and/or diagnosis of COVID-19 infection under section 564(b)(1) of the Act, 21 U.S.C. 371IRC-7(E) (1), unless the authorization is terminated or revoked sooner. When diagnostic testing is negative, the possibility of a false negative result should be considered in the context of a patient's recent exposures and the presence of clinical signs and symptoms consistent with COVID-19. An individual without symptoms of COVID-19 and who is not shedding SARS-CoV-2 virus wo uld expect to have a negative (not detected) result in this assay.   SARS-COV-2, NAA 2 DAY TAT     Status: None   Collection Time: 12/15/20  6:26 PM   Nasopharynge  Screenin  Result Value Ref Range   SARS-CoV-2, NAA 2 DAY TAT Performed   Novel Coronavirus, NAA ([facility])     Status: None   Collection Time: 12/22/20  4:06 PM   Specimen: Nasopharyngeal(NP) swabs in vial transport medium   Nasopharynge  Is this  Result Value Ref Range   SARS-CoV-2, NAA Not Detected Not Detected    Comment: This nucleic acid amplification test was developed and its performance characteristics determined by [employer]. Nucleic acid amplification tests include RT-PCR and TMA. This test has not been FDA cleared or approved. This test has been authorized by FDA under an Emergency Use Authorization (EUA). This test is only authorized for the duration of time the declaration that circumstances exist justifying the authorization of the emergency use of in vitro diagnostic tests for detection of SARS-CoV-2 virus and/or diagnosis of COVID-19 infection under section 564(b)(1) of the Act, 21 U.S.C. 938BOF-7(P) (1), unless the authorization is terminated or revoked sooner. When diagnostic testing is negative, the possibility of a false negative result should be considered in the context of a patient's recent exposures and the presence of clinical signs and symptoms consistent with COVID-19. An individual without  symptoms of COVID-19 and who is not shedding SARS-CoV-2 virus wo uld expect to have a negative (not detected) result in this assay.   SARS-COV-2, NAA 2 DAY TAT     Status: None   Collection Time: 12/22/20  4:06 PM   Nasopharynge  Is this  Result Value Ref Range   SARS-CoV-2, NAA 2 DAY TAT Performed   Novel Coronavirus, NAA ([facility])     Status: Abnormal   Collection Time: 12/27/20 12:00 AM   Specimen: Nasopharyngeal(NP) swabs in vial transport medium   Nasopharynge  Result Value Ref Range   SARS-CoV-2, NAA Detected (A) Not Detected    Comment: Patients who have a positive COVID-19 test result may now have treatment options. Treatment options are available for patients with mild to moderate symptoms and for hospitalized patients. Visit our website at [email or web address] for resources and information. This nucleic acid amplification test was developed and its performance characteristics determined by [employer]. Nucleic acid amplification tests include RT-PCR and TMA. This test has  not been FDA cleared or approved. This test has been authorized by FDA under an Emergency Use Authorization (EUA). This test is only authorized for the duration of time the declaration that circumstances exist justifying the authorization of the emergency use of in vitro diagnostic tests for detection of SARS-CoV-2 virus and/or diagnosis of COVID-19 infection under section 564(b)(1) of the Act, 21 U.S.C. 323FTD-3(U) (1), unless the authorization is terminated or revoked sooner. When diagnostic testing is negativ e, the possibility of a false negative result should be considered in the context of a patient's recent exposures and the presence of clinical signs and symptoms consistent with COVID-19. An individual without symptoms of COVID-19 and who is not shedding SARS-CoV-2 virus would expect to have a negative (not detected) result in this assay.   SARS-COV-2, NAA 2 DAY TAT      Status: None   Collection Time: 12/27/20 12:00 AM   Nasopharynge  Result Value Ref Range   SARS-CoV-2, NAA 2 DAY TAT Performed      Assessment: 60 y.o. male with mild/moderate COVID 19 viral infection diagnosed on 1/25 at high risk for progression to severe COVID 19. TAA and lung cancer on active chemo. He is vaccinated but not boosted  Plan:  This patient is a 60 y.o. male that meets the following criteria for Emergency Use Authorization of: Paxlovid 1. Age >12 yr AND > 40 kg 2. SARS-COV-2 positive test 3. Symptom onset < 5 days 4. Mild-to-moderate COVID disease with high risk for severe progression to hospitalization or death  I have spoken and communicated the following to the patient or parent/caregiver regarding: 1. Paxlovid is an unapproved drug that is authorized for use under an Emergency Use Authorization.  2. There are no adequate, approved, available products for the treatment of COVID-19 in adults who have mild-to-moderate COVID-19 and are at high risk for progressing to severe COVID-19, including hospitalization or death. 3. Other therapeutics are currently authorized. For additional information on all products authorized for treatment or prevention of COVID-19, please see [email or web address].  4. There are benefits and risks of taking this treatment as outlined in the "Fact Sheet for Patients and Caregivers."  5. "Fact Sheet for Patients and Caregivers" was reviewed with patient. A hard copy will be provided to patient from pharmacy prior to the patient receiving treatment. 6. Patients should continue to self-isolate and use infection control measures (e.g., wear mask, isolate, social distance, avoid sharing personal items, clean and disinfect "high touch" surfaces, and frequent handwashing) according to CDC guidelines.  7. The patient or parent/caregiver has the option to  accept or refuse treatment. 8. Patient medication history was reviewed for potential drug interactions:Interaction with home meds: viagra- which he knows not to take 9. Patient's creatinine clearance was calculated to be 108, and they were therefore prescribed Normal dose (CrCl>60) - nirmatrelvir 150mg  tab (2 tablet) by mouth twice daily AND ritonavir 100mg  tab (1 tablet) by mouth twice daily   After reviewing above information with the patient, the patient agrees to receive Paxlovid.  Follow up instructions:    . Take prescription BID x 5 days as directed . Reach out to pharmacist for counseling on medication if desired . For concerns regarding further COVID symptoms please follow up with your PCP or urgent care . For urgent or life-threatening issues, seek care at your local emergency department  The patient was provided an opportunity to ask questions, and all were answered. The patient agreed with the plan and demonstrated an understanding  of the instructions.   Script sent to [facility] and opted to pick up RX. I also set up him up for the monoclonal antibody infusion today as well. Please see orders for consent.   The patient was advised to call their PCP or seek an in-person evaluation if the symptoms worsen or if the condition fails to improve as anticipated.   I provided 20 minutes of non face-to-face telephone visit time during this encounter, and > 50% was spent counseling as documented under my assessment & plan.  Angelena Form, PA-C 12/30/2020 /9:09 AM

## 2020-12-30 NOTE — Discharge Instructions (Signed)

## 2021-01-04 ENCOUNTER — Encounter: Payer: Self-pay | Admitting: *Deleted

## 2021-01-04 ENCOUNTER — Encounter: Payer: Self-pay | Admitting: Family Medicine

## 2021-01-05 ENCOUNTER — Telehealth: Payer: Self-pay | Admitting: Internal Medicine

## 2021-01-05 NOTE — Telephone Encounter (Signed)
On 2/2-I called patient to check on his recent Covid infection. Unable to leave a voicemail.

## 2021-01-18 ENCOUNTER — Telehealth: Payer: Self-pay | Admitting: Internal Medicine

## 2021-01-18 NOTE — Telephone Encounter (Signed)
Pt wanted Friday move to 2/25 pt checks mychart

## 2021-01-18 NOTE — Telephone Encounter (Signed)
Pt called and stated that he cannot come in for appts on Mondays, he is only available on Fridays.  Patient has a treatment scheduled-routing to team to advise.

## 2021-01-23 ENCOUNTER — Other Ambulatory Visit: Payer: BC Managed Care – PPO

## 2021-01-23 ENCOUNTER — Ambulatory Visit: Payer: BC Managed Care – PPO | Admitting: Internal Medicine

## 2021-01-23 ENCOUNTER — Ambulatory Visit: Payer: BC Managed Care – PPO

## 2021-01-27 ENCOUNTER — Inpatient Hospital Stay: Payer: BC Managed Care – PPO

## 2021-01-27 ENCOUNTER — Inpatient Hospital Stay (HOSPITAL_BASED_OUTPATIENT_CLINIC_OR_DEPARTMENT_OTHER): Payer: BC Managed Care – PPO | Admitting: Internal Medicine

## 2021-01-27 ENCOUNTER — Inpatient Hospital Stay: Payer: BC Managed Care – PPO | Attending: Internal Medicine

## 2021-01-27 DIAGNOSIS — Z8679 Personal history of other diseases of the circulatory system: Secondary | ICD-10-CM | POA: Diagnosis not present

## 2021-01-27 DIAGNOSIS — C3402 Malignant neoplasm of left main bronchus: Secondary | ICD-10-CM | POA: Insufficient documentation

## 2021-01-27 DIAGNOSIS — Z806 Family history of leukemia: Secondary | ICD-10-CM | POA: Insufficient documentation

## 2021-01-27 DIAGNOSIS — M7989 Other specified soft tissue disorders: Secondary | ICD-10-CM | POA: Insufficient documentation

## 2021-01-27 DIAGNOSIS — Z5111 Encounter for antineoplastic chemotherapy: Secondary | ICD-10-CM

## 2021-01-27 LAB — COMPREHENSIVE METABOLIC PANEL
ALT: 17 U/L (ref 0–44)
AST: 23 U/L (ref 15–41)
Albumin: 3.9 g/dL (ref 3.5–5.0)
Alkaline Phosphatase: 46 U/L (ref 38–126)
Anion gap: 12 (ref 5–15)
BUN: 20 mg/dL (ref 6–20)
CO2: 24 mmol/L (ref 22–32)
Calcium: 9.2 mg/dL (ref 8.9–10.3)
Chloride: 99 mmol/L (ref 98–111)
Creatinine, Ser: 1.23 mg/dL (ref 0.61–1.24)
GFR, Estimated: >60 mL/min (ref >60)
Glucose, Bld: 194 mg/dL — ABNORMAL HIGH (ref 70–99)
Potassium: 3.8 mmol/L (ref 3.5–5.1)
Sodium: 135 mmol/L (ref 135–145)
Total Bilirubin: 1 mg/dL (ref 0.3–1.2)
Total Protein: 7.4 g/dL (ref 6.5–8.1)

## 2021-01-27 LAB — CBC WITH DIFFERENTIAL/PLATELET
Abs Immature Granulocytes: 0.03 10*3/uL (ref 0.00–0.07)
Basophils Absolute: 0 10*3/uL (ref 0.0–0.1)
Basophils Relative: 1 %
Eosinophils Absolute: 0.3 10*3/uL (ref 0.0–0.5)
Eosinophils Relative: 6 %
HCT: 42 % (ref 39.0–52.0)
Hemoglobin: 14.4 g/dL (ref 13.0–17.0)
Immature Granulocytes: 1 %
Lymphocytes Relative: 38 %
Lymphs Abs: 2.1 10*3/uL (ref 0.7–4.0)
MCH: 31.7 pg (ref 26.0–34.0)
MCHC: 34.3 g/dL (ref 30.0–36.0)
MCV: 92.5 fL (ref 80.0–100.0)
Monocytes Absolute: 0.4 10*3/uL (ref 0.1–1.0)
Monocytes Relative: 7 %
Neutro Abs: 2.7 10*3/uL (ref 1.7–7.7)
Neutrophils Relative %: 47 %
Platelets: 134 10*3/uL — ABNORMAL LOW (ref 150–400)
RBC: 4.54 MIL/uL (ref 4.22–5.81)
RDW: 12.2 % (ref 11.5–15.5)
WBC: 5.6 10*3/uL (ref 4.0–10.5)
nRBC: 0 % (ref 0.0–0.2)

## 2021-01-27 MED ORDER — SODIUM CHLORIDE 0.9% FLUSH
10.0000 mL | INTRAVENOUS | Status: DC | PRN
  Administered 2021-01-27: 10 mL via INTRAVENOUS
  Filled 2021-01-27: qty 10

## 2021-01-27 MED ORDER — PROCHLORPERAZINE MALEATE 10 MG PO TABS
10.0000 mg | ORAL_TABLET | Freq: Once | ORAL | Status: AC
  Administered 2021-01-27: 10 mg via ORAL
  Filled 2021-01-27: qty 1

## 2021-01-27 MED ORDER — CYANOCOBALAMIN 1000 MCG/ML IJ SOLN
1000.0000 ug | Freq: Once | INTRAMUSCULAR | Status: AC
  Administered 2021-01-27: 1000 ug via INTRAMUSCULAR
  Filled 2021-01-27: qty 1

## 2021-01-27 MED ORDER — SODIUM CHLORIDE 0.9 % IV SOLN
500.0000 mg/m2 | Freq: Once | INTRAVENOUS | Status: AC
  Administered 2021-01-27: 1100 mg via INTRAVENOUS
  Filled 2021-01-27: qty 40

## 2021-01-27 MED ORDER — HEPARIN SOD (PORK) LOCK FLUSH 100 UNIT/ML IV SOLN
500.0000 [IU] | Freq: Once | INTRAVENOUS | Status: AC
  Administered 2021-01-27: 500 [IU] via INTRAVENOUS
  Filled 2021-01-27: qty 5

## 2021-01-27 MED ORDER — HEPARIN SOD (PORK) LOCK FLUSH 100 UNIT/ML IV SOLN
500.0000 [IU] | Freq: Once | INTRAVENOUS | Status: DC | PRN
  Filled 2021-01-27: qty 5

## 2021-01-27 MED ORDER — DEXAMETHASONE SODIUM PHOSPHATE 10 MG/ML IJ SOLN
6.0000 mg | Freq: Once | INTRAMUSCULAR | Status: AC
  Administered 2021-01-27: 6 mg via INTRAVENOUS
  Filled 2021-01-27: qty 1

## 2021-01-27 MED ORDER — SODIUM CHLORIDE 0.9 % IV SOLN
Freq: Once | INTRAVENOUS | Status: AC
  Filled 2021-01-27: qty 250

## 2021-01-27 NOTE — Assessment & Plan Note (Addendum)
#   Adenocarcinoma the lung; stage IV;JAN 10th, 2022-  CT scan chest/a/p-shows no evidence of metastatic disease-STABLE.   # Proceed with Alimta chemotherapy. Labs today reviewed;  acceptable for treatment today; B12 every third cycle cycle.   # Itching of palms/soles-continue hydrocortisone tpical prn.   # Swelling in the hands/feet-grade 1 asymptomatic.? Alimta; monitor; STABLE.   #Thoracic aneurysm-approximately 4.5 cm in size; on surveillance- STABLE.   # DISPOSITION:  # Alimta today # Follow up in 3 weeks MD-labs- cbc/cmp; alimta Dr.B  # I reviewed the blood work- with the patient in detail; also reviewed the imaging independently [as summarized above]; and with the patient in detail.

## 2021-01-27 NOTE — Progress Notes (Signed)
[facility] OFFICE PROGRESS NOTE  Patient Care Team: [name], MD as PCP - General (Family Medicine) [name], MD as Consulting Physician (Internal Medicine)  Cancer Staging No matching staging information was found for the patient.   Oncology History Overview Note  # 2012- METASTATIC ADENO CA of LEFT LUNG [acinar pattern] s/p MED LN Bx [NEG- EGFR/K-ras/ALK mutation];[name]; [name]; CT JULY  2016- NED; JAN 2018- NED;   # AUG 2020-clinical trial closed; continue maintenance Alimta  # Thoracic aortic aneurysm-4.0 cm in 2016; currently 4.6 cm.  s/p referral to Dr. Faith Rogue.; MRI in 6 months planned.  ----------------------------------------------------------    DIAGNOSIS: [name]  STAGE: 4       ;GOALS: Palliative  CURRENT/MOST RECENT THERAPY _0  Alimta maintenance    Cancer of hilus of left lung ([place])  07/20/2016 Initial Diagnosis   Cancer of hilus of left lung ([place])   12/11/2019 -  Chemotherapy    Patient is on Treatment Plan: LUNG PEMETREXED (ALIMTA) Q21D          INTERVAL HISTORY:  Mitchell Mosley 61 y.o.  male pleasant patient above history of metastatic adenocarcinoma the lung-NED currently on maintenance Alimta is here for follow-up/ review results of the CT scan.   Patient's chemotherapy had been interrupted because of recent Covid infection.  Patient received monoclonal antibody therapy.  Denies any worsening cough.  Complains of itchy palms and soles.  Not any worse.  No headaches.  No fever no chills.  Review of Systems  Constitutional: Negative for chills, diaphoresis, fever, malaise/fatigue and weight loss.  HENT: Negative for nosebleeds and sore throat.   Eyes: Negative for double vision.  Respiratory: Negative for hemoptysis, shortness of breath and wheezing.   Cardiovascular: Positive for leg  swelling. Negative for chest pain, palpitations and orthopnea.  Gastrointestinal: Negative for abdominal pain, blood in stool, constipation, diarrhea, heartburn, melena, nausea and vomiting.  Genitourinary: Negative for dysuria, frequency and urgency.  Musculoskeletal: Positive for joint pain.  Skin: Negative.  Negative for itching and rash.  Neurological: Negative for dizziness, tingling, focal weakness, weakness and headaches.  Endo/Heme/Allergies: Does not bruise/bleed easily.  Psychiatric/Behavioral: Negative for depression. The patient is not nervous/anxious and does not have insomnia.       PAST MEDICAL HISTORY :  Past Medical History:  Diagnosis Date  . Allergy   . Lung cancer ([place])   . Mild valvular heart disease Nov. 2014   per 2 D Echocardiogram done for persistent leg edema, monitored by cardiology  . Peripheral edema   . Thoracic aortic aneurysm (TAA) ([place])     PAST SURGICAL HISTORY :   Past Surgical History:  Procedure Laterality Date  . ELBOW SURGERY Right 2011  . Left anterior thoracotomy with biopsy  March 2012  . PORTACATH PLACEMENT  March 2012  . TONSILLECTOMY  as child    FAMILY HISTORY :   Family History  Problem Relation Age of Onset  . Leukemia Father   . Diabetes Mother     SOCIAL HISTORY:   Social History   Tobacco Use  . Smoking status: Never Smoker  . Smokeless tobacco: Never Used  Substance Use Topics  . Alcohol use: Yes    Alcohol/week: 0.0 standard drinks    Comment: "social drinker"   . Drug use: No    ALLERGIES:  is allergic to penicillins.  MEDICATIONS:  Current  Outpatient Medications  Medication Sig Dispense Refill  . folic acid (FOLVITE) 1 MG tablet TAKE 1 TABLET BY MOUTH EVERY DAY 90 tablet 3  . furosemide (LASIX) 20 MG tablet TAKE 1 TABLET (20 MG TOTAL) BY MOUTH DAILY AS NEEDED FOR SWELLING 90 tablet 1  . meloxicam (MOBIC) 7.5 MG tablet Take 7.5 mg by mouth daily.     . Multiple Vitamin (MULTIVITAMIN) capsule Take 1  capsule by mouth daily. Reported on 02/24/2016    . NON FORMULARY Relief Factor with vitamin D and Turmeric    . Omega-3 Fatty Acids (FISH OIL) 1000 MG CAPS Take 1 capsule by mouth daily. Reported on 02/24/2016    . senna (SENOKOT) 8.6 MG tablet Take 1 tablet by mouth daily. Take as needed around time of chemotherapy    . sildenafil (VIAGRA) 100 MG tablet Take 1 tablet (100 mg total) by mouth daily as needed for erectile dysfunction. 20 tablet 11  . spironolactone (ALDACTONE) 25 MG tablet TAKE 1 TABLET BY MOUTH EVERY DAY 90 tablet 3  . tadalafil (CIALIS) 20 MG tablet Take one tablet every 2 days as needed 50 tablet 5  . vitamin B-12 (CYANOCOBALAMIN) 1000 MCG tablet Take 1,000 mcg by mouth daily.     No current facility-administered medications for this visit.   Facility-Administered Medications Ordered in Other Visits  Medication Dose Route Frequency Provider Last Rate Last Admin  . heparin lock flush 100 unit/mL  500 Units Intravenous Once Charlaine Dalton R, MD      . heparin lock flush 100 unit/mL  500 Units Intracatheter Once PRN Charlaine Dalton R, [name], MD   10 mL at 05/27/15 1543  . sodium chloride 0.9 % injection 10 mL  10 mL Intracatheter PRN [name], MD   10 mL at 06/17/15 1510  . sodium chloride 0.9 % injection 10 mL  10 mL Intravenous PRN Choksi, Delorise Shiner, MD   10 mL at 07/08/15 1340  . sodium chloride flush (NS) 0.9 % injection 10 mL  10 mL Intravenous PRN Verlon Au, NP   10 mL at 09/30/20 1412  . sodium chloride flush (NS) 0.9 % injection 10 mL  10 mL Intravenous PRN [name], MD   10 mL at 01/27/21 1328    PHYSICAL EXAMINATION: ECOG PERFORMANCE STATUS: 0 - Asymptomatic  BP 125/76   Pulse 76   Temp 97.8 F (36.6 C) (Tympanic)   Resp 20   Ht _0  (1.778 m)   Wt 209 lb (94.8 kg)   BMI 29.99 kg/m   Filed Weights   01/27/21 1344  Weight: 209 lb (94.8 kg)     Physical Exam HENT:     Head: Normocephalic and atraumatic.     Mouth/Throat:     Pharynx: No oropharyngeal exudate.  Eyes:     Pupils: Pupils are equal, round, and reactive to light.  Cardiovascular:     Rate and Rhythm: Normal rate and regular rhythm.  Pulmonary:     Effort: No respiratory distress.     Breath sounds: No wheezing.  Abdominal:     General: Bowel sounds are normal. There is no distension.     Palpations: Abdomen is soft. There is no mass.     Tenderness: There is no abdominal tenderness. There is no guarding or rebound.  Musculoskeletal:        General: No tenderness.  Normal range of motion.     Cervical back: Normal range of motion and neck supple.     Comments: Grade 1 swelling in the legs bilaterally.  Skin:    General: Skin is warm.  Neurological:     Mental Status: He is alert and oriented to person, place, and time.  Psychiatric:        Mood and Affect: Affect normal.      LABORATORY DATA:  I have reviewed the data as listed    Component Value Date/Time   NA 135 01/27/2021 1317   NA 137 04/27/2020 1029   NA 138 12/10/2014 1341   K 3.8 01/27/2021 1317   K 3.6 12/10/2014 1341   CL 99 01/27/2021 1317   CL 102 12/10/2014 1341   CO2 24 01/27/2021 1317   CO2 30 12/10/2014 1341   GLUCOSE 194 (H) 01/27/2021 1317   GLUCOSE 160 (H) 12/10/2014 1341   BUN 20 01/27/2021 1317   BUN 15 04/27/2020 1029   BUN 21 (H) 12/10/2014 1341   CREATININE 1.23 01/27/2021 1317   CREATININE 1.10 03/25/2015 1453   CALCIUM 9.2 01/27/2021 1317   CALCIUM 8.4 (L) 12/10/2014 1341   PROT 7.4 01/27/2021 1317   PROT 7.3 04/27/2020 1029   PROT 6.6 12/10/2014 1341   ALBUMIN 3.9 01/27/2021 1317   ALBUMIN 4.7 04/27/2020 1029   ALBUMIN 3.4 12/10/2014 1341   AST 23 01/27/2021 1317   AST 17 12/10/2014 1341   ALT 17 01/27/2021 1317   ALT 23 12/10/2014 1341   ALKPHOS 46 01/27/2021 1317   ALKPHOS 50 12/10/2014 1341   BILITOT 1.0 01/27/2021 1317   BILITOT 0.2 04/27/2020  1029   BILITOT 0.5 12/10/2014 1341   GFRNONAA >60 01/27/2021 1317   GFRNONAA >60 03/25/2015 1453   GFRAA >60 08/19/2020 1324   GFRAA >60 03/25/2015 1453    No results found for: SPEP, UPEP  Lab Results  Component Value Date   WBC 5.6 01/27/2021   NEUTROABS 2.7 01/27/2021   HGB 14.4 01/27/2021   HCT 42.0 01/27/2021   MCV 92.5 01/27/2021   PLT 134 (L) 01/27/2021      Chemistry      Component Value Date/Time   NA 135 01/27/2021 1317   NA 137 04/27/2020 1029   NA 138 12/10/2014 1341   K 3.8 01/27/2021 1317   K 3.6 12/10/2014 1341   CL 99 01/27/2021 1317   CL 102 12/10/2014 1341   CO2 24 01/27/2021 1317   CO2 30 12/10/2014 1341   BUN 20 01/27/2021 1317   BUN 15 04/27/2020 1029   BUN 21 (H) 12/10/2014 1341   CREATININE 1.23 01/27/2021 1317   CREATININE 1.10 03/25/2015 1453      Component Value Date/Time   CALCIUM 9.2 01/27/2021 1317   CALCIUM 8.4 (L) 12/10/2014 1341   ALKPHOS 46 01/27/2021 1317   ALKPHOS 50 12/10/2014 1341   AST 23 01/27/2021 1317   AST 17 12/10/2014 1341   ALT 17 01/27/2021 1317   ALT 23 12/10/2014 1341   BILITOT 1.0 01/27/2021 1317   BILITOT 0.2 04/27/2020 1029   BILITOT 0.5 12/10/2014 1341       RADIOGRAPHIC STUDIES: I have personally reviewed the radiological images as listed and agreed with the findings in the report. No results found.   ASSESSMENT & PLAN:  Cancer of hilus of left lung ([place]) # Adenocarcinoma the lung; stage IV;JAN 10th, 2022-  CT scan chest/a/p-shows no evidence of metastatic disease-STABLE.   #  Proceed with Alimta chemotherapy. Labs today reviewed;  acceptable for treatment today; B12 every third cycle cycle.   # Itching of palms/soles-continue hydrocortisone tpical prn.   # Swelling in the hands/feet-grade 1 asymptomatic.? Alimta; monitor; STABLE.   #Thoracic aneurysm-approximately 4.5 cm in size; on surveillance- STABLE.   # DISPOSITION:  # Alimta today # Follow up in 3 weeks MD-labs- cbc/cmp; alimta Dr.B  #  I reviewed the blood work- with the patient in detail; also reviewed the imaging independently [as summarized above]; and with the patient in detail.       No orders of the defined types were placed in this encounter.  All questions were answered. The patient knows to call the clinic with any problems, questions or concerns.      [name], MD 01/27/2021 3:38 PM

## 2021-02-17 ENCOUNTER — Inpatient Hospital Stay: Payer: BC Managed Care – PPO

## 2021-02-17 ENCOUNTER — Inpatient Hospital Stay: Payer: BC Managed Care – PPO | Attending: Internal Medicine

## 2021-02-17 ENCOUNTER — Inpatient Hospital Stay (HOSPITAL_BASED_OUTPATIENT_CLINIC_OR_DEPARTMENT_OTHER): Payer: BC Managed Care – PPO | Admitting: Nurse Practitioner

## 2021-02-17 ENCOUNTER — Other Ambulatory Visit: Payer: Self-pay | Admitting: Oncology

## 2021-02-17 ENCOUNTER — Encounter: Payer: Self-pay | Admitting: Nurse Practitioner

## 2021-02-17 VITALS — BP 121/73 | HR 81 | Temp 98.1°F | Resp 16 | Wt 210.2 lb

## 2021-02-17 DIAGNOSIS — Z8679 Personal history of other diseases of the circulatory system: Secondary | ICD-10-CM | POA: Insufficient documentation

## 2021-02-17 DIAGNOSIS — C3402 Malignant neoplasm of left main bronchus: Secondary | ICD-10-CM

## 2021-02-17 DIAGNOSIS — Z806 Family history of leukemia: Secondary | ICD-10-CM | POA: Diagnosis not present

## 2021-02-17 DIAGNOSIS — Z5111 Encounter for antineoplastic chemotherapy: Secondary | ICD-10-CM

## 2021-02-17 DIAGNOSIS — M7989 Other specified soft tissue disorders: Secondary | ICD-10-CM | POA: Diagnosis not present

## 2021-02-17 LAB — COMPREHENSIVE METABOLIC PANEL
ALT: 19 U/L (ref 0–44)
AST: 25 U/L (ref 15–41)
Albumin: 3.8 g/dL (ref 3.5–5.0)
Alkaline Phosphatase: 44 U/L (ref 38–126)
Anion gap: 9 (ref 5–15)
BUN: 26 mg/dL — ABNORMAL HIGH (ref 6–20)
CO2: 26 mmol/L (ref 22–32)
Calcium: 8.8 mg/dL — ABNORMAL LOW (ref 8.9–10.3)
Chloride: 101 mmol/L (ref 98–111)
Creatinine, Ser: 1.33 mg/dL — ABNORMAL HIGH (ref 0.61–1.24)
GFR, Estimated: >60 mL/min (ref >60)
Glucose, Bld: 139 mg/dL — ABNORMAL HIGH (ref 70–99)
Potassium: 3.7 mmol/L (ref 3.5–5.1)
Sodium: 136 mmol/L (ref 135–145)
Total Bilirubin: 1 mg/dL (ref 0.3–1.2)
Total Protein: 7.1 g/dL (ref 6.5–8.1)

## 2021-02-17 LAB — CBC WITH DIFFERENTIAL/PLATELET
Abs Immature Granulocytes: 0.01 10*3/uL (ref 0.00–0.07)
Basophils Absolute: 0 10*3/uL (ref 0.0–0.1)
Basophils Relative: 1 %
Eosinophils Absolute: 0.1 10*3/uL (ref 0.0–0.5)
Eosinophils Relative: 3 %
HCT: 40.3 % (ref 39.0–52.0)
Hemoglobin: 13.5 g/dL (ref 13.0–17.0)
Immature Granulocytes: 0 %
Lymphocytes Relative: 37 %
Lymphs Abs: 1.7 10*3/uL (ref 0.7–4.0)
MCH: 31.4 pg (ref 26.0–34.0)
MCHC: 33.5 g/dL (ref 30.0–36.0)
MCV: 93.7 fL (ref 80.0–100.0)
Monocytes Absolute: 0.5 10*3/uL (ref 0.1–1.0)
Monocytes Relative: 10 %
Neutro Abs: 2.3 10*3/uL (ref 1.7–7.7)
Neutrophils Relative %: 49 %
Platelets: 168 10*3/uL (ref 150–400)
RBC: 4.3 MIL/uL (ref 4.22–5.81)
RDW: 13 % (ref 11.5–15.5)
WBC: 4.6 10*3/uL (ref 4.0–10.5)
nRBC: 0 % (ref 0.0–0.2)

## 2021-02-17 MED ORDER — SODIUM CHLORIDE 0.9 % IV SOLN
500.0000 mg/m2 | Freq: Once | INTRAVENOUS | Status: AC
  Administered 2021-02-17: 1100 mg via INTRAVENOUS
  Filled 2021-02-17: qty 40

## 2021-02-17 MED ORDER — PROCHLORPERAZINE MALEATE 10 MG PO TABS
10.0000 mg | ORAL_TABLET | Freq: Once | ORAL | Status: AC
  Administered 2021-02-17: 10 mg via ORAL
  Filled 2021-02-17: qty 1

## 2021-02-17 MED ORDER — HEPARIN SOD (PORK) LOCK FLUSH 100 UNIT/ML IV SOLN
500.0000 [IU] | Freq: Once | INTRAVENOUS | Status: AC | PRN
  Administered 2021-02-17: 500 [IU]
  Filled 2021-02-17: qty 5

## 2021-02-17 MED ORDER — SODIUM CHLORIDE 0.9 % IV SOLN
Freq: Once | INTRAVENOUS | Status: AC
  Filled 2021-02-17: qty 250

## 2021-02-17 MED ORDER — DEXAMETHASONE SODIUM PHOSPHATE 10 MG/ML IJ SOLN
6.0000 mg | Freq: Once | INTRAMUSCULAR | Status: AC
  Administered 2021-02-17: 6 mg via INTRAVENOUS
  Filled 2021-02-17: qty 1

## 2021-02-17 MED ORDER — HEPARIN SOD (PORK) LOCK FLUSH 100 UNIT/ML IV SOLN
INTRAVENOUS | Status: AC
  Filled 2021-02-17: qty 5

## 2021-02-17 NOTE — Progress Notes (Signed)
[facility] OFFICE PROGRESS NOTE  Patient Care Team: [name], MD as PCP - General (Family Medicine) [name], MD as Consulting Physician (Internal Medicine)  Cancer Staging No matching staging information was found for the patient.   Oncology History Overview Note  # 2012- METASTATIC ADENO CA of LEFT LUNG [acinar pattern] s/p MED LN Bx [NEG- EGFR/K-ras/ALK mutation];[name]; [name]; CT JULY  2016- NED; JAN 2018- NED;   # AUG 2020-clinical trial closed; continue maintenance Alimta  # Thoracic aortic aneurysm-4.0 cm in 2016; currently 4.6 cm.  s/p referral to Dr. Faith Rogue.; MRI in 6 months planned.  ----------------------------------------------------------    DIAGNOSIS: Mitchell.Mosley ] Adenocarcinoma lung  STAGE: 4       ;GOALS: Palliative  CURRENT/MOST RECENT THERAPY _0  Alimta maintenance    Cancer of hilus of left lung ([place])  07/20/2016 Initial Diagnosis   Cancer of hilus of left lung ([place])   12/11/2019 -  Chemotherapy    Patient is on Treatment Plan: LUNG PEMETREXED (ALIMTA) Q21D          INTERVAL HISTORY: Mitchell Mosley 60 y.o. male pleasant patient with above history of metastatic adenocarcinoma of the lung, currently no evidence of disease, on maintenance Alimta, who returns to clinic for consideration of continuation of chemotherapy.  CT in January showed no evidence of residual, recurrent, or metastatic disease.  Denies fatigue, rash, nausea, anorexia, vomiting, notes mouth sores, diarrhea.  No headache, fever, chills.   Review of Systems  Constitutional: Negative for chills, diaphoresis, fever, malaise/fatigue and weight loss.  HENT: Negative for nosebleeds and sore throat.   Eyes: Negative for double vision.  Respiratory: Negative for hemoptysis, shortness of breath and wheezing.   Cardiovascular: Positive for leg swelling  (chronic & unchanged). Negative for chest pain, palpitations and orthopnea.  Gastrointestinal: Negative for abdominal pain, blood in stool, constipation, diarrhea, heartburn, melena, nausea and vomiting.  Genitourinary: Negative for dysuria, frequency and urgency.  Musculoskeletal: Positive for joint pain.  Skin: Negative.  Negative for itching and rash.  Neurological: Negative for dizziness, tingling, focal weakness, weakness and headaches.  Endo/Heme/Allergies: Does not bruise/bleed easily.  Psychiatric/Behavioral: Negative for depression. The patient is not nervous/anxious and does not have insomnia.       PAST MEDICAL HISTORY :  Past Medical History:  Diagnosis Date  . Allergy   . Lung cancer ([place])   . Mild valvular heart disease Nov. 2014   per 2 D Echocardiogram done for persistent leg edema, monitored by cardiology  . Peripheral edema   . Thoracic aortic aneurysm (TAA) ([place])     PAST SURGICAL HISTORY :   Past Surgical History:  Procedure Laterality Date  . ELBOW SURGERY Right 2011  . Left anterior thoracotomy with biopsy  March 2012  . PORTACATH PLACEMENT  March 2012  . TONSILLECTOMY  as child    FAMILY HISTORY :   Family History  Problem Relation Age of Onset  . Leukemia Father   . Diabetes Mother     SOCIAL HISTORY:   Social History   Tobacco Use  . Smoking status: Never Smoker  . Smokeless tobacco: Never Used  Substance Use Topics  . Alcohol use: Yes    Alcohol/week: 0.0 standard drinks    Comment: "social drinker"   . Drug use: No    ALLERGIES:  is allergic to penicillins.  MEDICATIONS:  Current Outpatient Medications  Medication Sig  Dispense Refill  . folic acid (FOLVITE) 1 MG tablet TAKE 1 TABLET BY MOUTH EVERY DAY 90 tablet 3  . furosemide (LASIX) 20 MG tablet TAKE 1 TABLET (20 MG TOTAL) BY MOUTH DAILY AS NEEDED FOR SWELLING 90 tablet 1  . meloxicam (MOBIC) 7.5 MG tablet Take 7.5 mg by mouth daily.     . Multiple Vitamin (MULTIVITAMIN) capsule  Take 1 capsule by mouth daily. Reported on 02/24/2016    . NON FORMULARY Relief Factor with vitamin D and Turmeric    . Omega-3 Fatty Acids (FISH OIL) 1000 MG CAPS Take 1 capsule by mouth daily. Reported on 02/24/2016    . senna (SENOKOT) 8.6 MG tablet Take 1 tablet by mouth daily. Take as needed around time of chemotherapy    . sildenafil (VIAGRA) 100 MG tablet Take 1 tablet (100 mg total) by mouth daily as needed for erectile dysfunction. 20 tablet 11  . spironolactone (ALDACTONE) 25 MG tablet TAKE 1 TABLET BY MOUTH EVERY DAY 90 tablet 3  . tadalafil (CIALIS) 20 MG tablet Take one tablet every 2 days as needed 50 tablet 5  . vitamin B-12 (CYANOCOBALAMIN) 1000 MCG tablet Take 1,000 mcg by mouth daily.     No current facility-administered medications for this visit.   Facility-Administered Medications Ordered in Other Visits  Medication Dose Route Frequency Provider Last Rate Last Admin  . dexamethasone (DECADRON) injection 6 mg  6 mg Intravenous Once Verlon Au, NP      . heparin lock flush 100 unit/mL  500 Units Intravenous Once Charlaine Dalton R, MD      . heparin lock flush 100 unit/mL  500 Units Intracatheter Once PRN Verlon Au, NP      . prochlorperazine (COMPAZINE) tablet 10 mg  10 mg Oral Once Verlon Au, NP      . sodium chloride 0.9 % injection 10 mL  10 mL Intracatheter PRN [name], MD   10 mL at 05/27/15 1543  . sodium chloride 0.9 % injection 10 mL  10 mL Intracatheter PRN [name], MD   10 mL at 06/17/15 1510  . sodium chloride 0.9 % injection 10 mL  10 mL Intravenous PRN Choksi, Delorise Shiner, MD   10 mL at 07/08/15 1340  . sodium chloride flush (NS) 0.9 % injection 10 mL  10 mL Intravenous PRN Verlon Au, NP   10 mL at 09/30/20 1412    PHYSICAL EXAMINATION: ECOG PERFORMANCE STATUS: 0 - Asymptomatic  BP 121/73 (BP Location: Left Arm, Patient Position: Sitting)   Pulse 81   Temp 98.1 F (36.7 C) (Tympanic)   Resp 16   Wt 210 lb 3.2 oz (95.3  kg)   SpO2 99%   BMI 30.16 kg/m   Filed Weights   02/17/21 1352  Weight: 210 lb 3.2 oz (95.3 kg)    Physical Exam HENT:     Head: Normocephalic and atraumatic.     Mouth/Throat:     Pharynx: No oropharyngeal exudate.  Eyes:     Pupils: Pupils are equal, round, and reactive to light.  Cardiovascular:     Rate and Rhythm: Normal rate and regular rhythm.  Pulmonary:     Effort: No respiratory distress.     Breath sounds: No wheezing.  Abdominal:     General: Bowel sounds are normal. There is no distension.     Palpations: Abdomen is soft. There is no mass.     Tenderness: There is no abdominal tenderness. There is  no guarding or rebound.  Musculoskeletal:        General: No tenderness. Normal range of motion.     Cervical back: Normal range of motion and neck supple.     Comments: Grade 1 swelling in the legs bilaterally.  Skin:    General: Skin is warm.  Neurological:     Mental Status: He is alert and oriented to person, place, and time.  Psychiatric:        Mood and Affect: Affect normal.      LABORATORY DATA:  I have reviewed the data as listed    Component Value Date/Time   NA 136 02/17/2021 1333   NA 137 04/27/2020 1029   NA 138 12/10/2014 1341   K 3.7 02/17/2021 1333   K 3.6 12/10/2014 1341   CL 101 02/17/2021 1333   CL 102 12/10/2014 1341   CO2 26 02/17/2021 1333   CO2 30 12/10/2014 1341   GLUCOSE 139 (H) 02/17/2021 1333   GLUCOSE 160 (H) 12/10/2014 1341   BUN 26 (H) 02/17/2021 1333   BUN 15 04/27/2020 1029   BUN 21 (H) 12/10/2014 1341   CREATININE 1.33 (H) 02/17/2021 1333   CREATININE 1.10 03/25/2015 1453   CALCIUM 8.8 (L) 02/17/2021 1333   CALCIUM 8.4 (L) 12/10/2014 1341   PROT 7.1 02/17/2021 1333   PROT 7.3 04/27/2020 1029   PROT 6.6 12/10/2014 1341   ALBUMIN 3.8 02/17/2021 1333   ALBUMIN 4.7 04/27/2020 1029   ALBUMIN 3.4 12/10/2014 1341   AST 25 02/17/2021 1333   AST 17 12/10/2014 1341   ALT 19 02/17/2021 1333   ALT 23 12/10/2014 1341    ALKPHOS 44 02/17/2021 1333   ALKPHOS 50 12/10/2014 1341   BILITOT 1.0 02/17/2021 1333   BILITOT 0.2 04/27/2020 1029   BILITOT 0.5 12/10/2014 1341   GFRNONAA >60 02/17/2021 1333   GFRNONAA >60 03/25/2015 1453   GFRAA >60 08/19/2020 1324   GFRAA >60 03/25/2015 1453    No results found for: SPEP, UPEP  Lab Results  Component Value Date   WBC 4.6 02/17/2021   NEUTROABS 2.3 02/17/2021   HGB 13.5 02/17/2021   HCT 40.3 02/17/2021   MCV 93.7 02/17/2021   PLT 168 02/17/2021      Chemistry      Component Value Date/Time   NA 136 02/17/2021 1333   NA 137 04/27/2020 1029   NA 138 12/10/2014 1341   K 3.7 02/17/2021 1333   K 3.6 12/10/2014 1341   CL 101 02/17/2021 1333   CL 102 12/10/2014 1341   CO2 26 02/17/2021 1333   CO2 30 12/10/2014 1341   BUN 26 (H) 02/17/2021 1333   BUN 15 04/27/2020 1029   BUN 21 (H) 12/10/2014 1341   CREATININE 1.33 (H) 02/17/2021 1333   CREATININE 1.10 03/25/2015 1453      Component Value Date/Time   CALCIUM 8.8 (L) 02/17/2021 1333   CALCIUM 8.4 (L) 12/10/2014 1341   ALKPHOS 44 02/17/2021 1333   ALKPHOS 50 12/10/2014 1341   AST 25 02/17/2021 1333   AST 17 12/10/2014 1341   ALT 19 02/17/2021 1333   ALT 23 12/10/2014 1341   BILITOT 1.0 02/17/2021 1333   BILITOT 0.2 04/27/2020 1029   BILITOT 0.5 12/10/2014 1341       RADIOGRAPHIC STUDIES: I have personally reviewed the radiological images as listed and agreed with the findings in the report. No results found.   ASSESSMENT & PLAN:  No problem-specific Assessment & Plan notes  found for this encounter.  1.  Adenocarcinoma of the lung-stage IV-diagnosed in 2012. CT in January showed no evidence of metastatic disease.  Labs today reviewed and acceptable for continuation of treatment.  Proceed with Alimta today.  He will receive B12 every third cycle; none today, due with cycle 18.   2. Itching Hands & Soles- likely secondary to alimta. Stable. Continue topical hydrocortisone prn.   3. Swelling  of hands & feet- grade 1. Likely secondary to Alimta. Continue to monitor.   4. Covid Infection- 12/27/20. Resolved. S/p monoclonal antibody therapy.   5. Thoracic aneurysm- on ct approx 4.5 cm in size. Stable.   Alimta today rtc in 3 weeks for labs (cbc, cmp), MD, Alimta   No orders of the defined types were placed in this encounter.  All questions were answered. The patient knows to call the clinic with any problems, questions or concerns.      Verlon Au, NP 02/17/2021 2:30 PM

## 2021-02-17 NOTE — Progress Notes (Signed)
Patient here for oncology follow-up appointment, expresses no complaints or concerns at this time.    

## 2021-03-07 ENCOUNTER — Other Ambulatory Visit: Payer: Self-pay | Admitting: Internal Medicine

## 2021-03-07 DIAGNOSIS — C3402 Malignant neoplasm of left main bronchus: Secondary | ICD-10-CM

## 2021-03-10 ENCOUNTER — Inpatient Hospital Stay: Payer: BC Managed Care – PPO

## 2021-03-10 ENCOUNTER — Inpatient Hospital Stay: Payer: BC Managed Care – PPO | Attending: Internal Medicine

## 2021-03-10 ENCOUNTER — Other Ambulatory Visit: Payer: Self-pay

## 2021-03-10 ENCOUNTER — Inpatient Hospital Stay (HOSPITAL_BASED_OUTPATIENT_CLINIC_OR_DEPARTMENT_OTHER): Payer: BC Managed Care – PPO | Admitting: Internal Medicine

## 2021-03-10 DIAGNOSIS — C3402 Malignant neoplasm of left main bronchus: Secondary | ICD-10-CM | POA: Insufficient documentation

## 2021-03-10 DIAGNOSIS — Z8679 Personal history of other diseases of the circulatory system: Secondary | ICD-10-CM | POA: Diagnosis not present

## 2021-03-10 DIAGNOSIS — Z5111 Encounter for antineoplastic chemotherapy: Secondary | ICD-10-CM | POA: Insufficient documentation

## 2021-03-10 DIAGNOSIS — Z806 Family history of leukemia: Secondary | ICD-10-CM | POA: Diagnosis not present

## 2021-03-10 DIAGNOSIS — M7989 Other specified soft tissue disorders: Secondary | ICD-10-CM | POA: Diagnosis not present

## 2021-03-10 LAB — CBC WITH DIFFERENTIAL/PLATELET
Abs Immature Granulocytes: 0.01 10*3/uL (ref 0.00–0.07)
Basophils Absolute: 0.1 10*3/uL (ref 0.0–0.1)
Basophils Relative: 1 %
Eosinophils Absolute: 0.1 10*3/uL (ref 0.0–0.5)
Eosinophils Relative: 2 %
HCT: 41 % (ref 39.0–52.0)
Hemoglobin: 14.1 g/dL (ref 13.0–17.0)
Immature Granulocytes: 0 %
Lymphocytes Relative: 36 %
Lymphs Abs: 1.9 10*3/uL (ref 0.7–4.0)
MCH: 31.6 pg (ref 26.0–34.0)
MCHC: 34.4 g/dL (ref 30.0–36.0)
MCV: 91.9 fL (ref 80.0–100.0)
Monocytes Absolute: 0.4 10*3/uL (ref 0.1–1.0)
Monocytes Relative: 8 %
Neutro Abs: 2.9 10*3/uL (ref 1.7–7.7)
Neutrophils Relative %: 53 %
Platelets: 166 10*3/uL (ref 150–400)
RBC: 4.46 MIL/uL (ref 4.22–5.81)
RDW: 13.1 % (ref 11.5–15.5)
WBC: 5.5 10*3/uL (ref 4.0–10.5)
nRBC: 0 % (ref 0.0–0.2)

## 2021-03-10 LAB — COMPREHENSIVE METABOLIC PANEL
ALT: 19 U/L (ref 0–44)
AST: 24 U/L (ref 15–41)
Albumin: 4.2 g/dL (ref 3.5–5.0)
Alkaline Phosphatase: 47 U/L (ref 38–126)
Anion gap: 10 (ref 5–15)
BUN: 22 mg/dL — ABNORMAL HIGH (ref 6–20)
CO2: 25 mmol/L (ref 22–32)
Calcium: 8.7 mg/dL — ABNORMAL LOW (ref 8.9–10.3)
Chloride: 100 mmol/L (ref 98–111)
Creatinine, Ser: 1.09 mg/dL (ref 0.61–1.24)
GFR, Estimated: >60 mL/min (ref >60)
Glucose, Bld: 210 mg/dL — ABNORMAL HIGH (ref 70–99)
Potassium: 3.8 mmol/L (ref 3.5–5.1)
Sodium: 135 mmol/L (ref 135–145)
Total Bilirubin: 0.8 mg/dL (ref 0.3–1.2)
Total Protein: 7.2 g/dL (ref 6.5–8.1)

## 2021-03-10 MED ORDER — DEXAMETHASONE SODIUM PHOSPHATE 10 MG/ML IJ SOLN
6.0000 mg | Freq: Once | INTRAMUSCULAR | Status: AC
  Administered 2021-03-10: 6 mg via INTRAVENOUS
  Filled 2021-03-10: qty 1

## 2021-03-10 MED ORDER — SODIUM CHLORIDE 0.9 % IV SOLN
Freq: Once | INTRAVENOUS | Status: AC
Start: 2021-03-10 — End: 2021-03-10
  Filled 2021-03-10: qty 250

## 2021-03-10 MED ORDER — HEPARIN SOD (PORK) LOCK FLUSH 100 UNIT/ML IV SOLN
INTRAVENOUS | Status: AC
  Filled 2021-03-10: qty 5

## 2021-03-10 MED ORDER — SODIUM CHLORIDE 0.9% FLUSH
10.0000 mL | Freq: Once | INTRAVENOUS | Status: AC
  Administered 2021-03-10: 10 mL via INTRAVENOUS
  Filled 2021-03-10: qty 10

## 2021-03-10 MED ORDER — CYANOCOBALAMIN 1000 MCG/ML IJ SOLN
1000.0000 ug | Freq: Once | INTRAMUSCULAR | Status: AC
Start: 2021-03-10 — End: 2021-03-10
  Administered 2021-03-10: 1000 ug via INTRAMUSCULAR
  Filled 2021-03-10: qty 1

## 2021-03-10 MED ORDER — SODIUM CHLORIDE 0.9 % IV SOLN
500.0000 mg/m2 | Freq: Once | INTRAVENOUS | Status: AC
  Administered 2021-03-10: 1100 mg via INTRAVENOUS
  Filled 2021-03-10: qty 40

## 2021-03-10 MED ORDER — HEPARIN SOD (PORK) LOCK FLUSH 100 UNIT/ML IV SOLN
500.0000 [IU] | Freq: Once | INTRAVENOUS | Status: AC | PRN
  Administered 2021-03-10: 500 [IU]
  Filled 2021-03-10: qty 5

## 2021-03-10 MED ORDER — PROCHLORPERAZINE MALEATE 10 MG PO TABS
10.0000 mg | ORAL_TABLET | Freq: Once | ORAL | Status: AC
Start: 2021-03-10 — End: 2021-03-10
  Administered 2021-03-10: 10 mg via ORAL
  Filled 2021-03-10: qty 1

## 2021-03-10 NOTE — Assessment & Plan Note (Addendum)
#   Adenocarcinoma the lung; stage IV;JAN 10th, 2022-  CT scan chest/a/p-shows no evidence of metastatic disease-STABLE.   # Proceed with Alimta chemotherapy. Labs today reviewed;  acceptable for treatment today; B12 every third cycle cycle.   # intermittent chest pain- ? [facility]-atypical cardiac ? covid related; defer to cardiologist [Dr.Paraschoes]  # Swelling in the hands/feet-grade 1 asymptomatic.? Alimta; monitor-STABLE  #Thoracic aneurysm-approximately 4.5 cm in size; on surveillance- STABLE.   # DISPOSITION:  # Alimta today # Follow up in 3 weeks MD-labs- cbc/cmp; alimta Dr.B

## 2021-03-10 NOTE — Progress Notes (Signed)
[facility] OFFICE PROGRESS NOTE  Patient Care Team: [name], MD as PCP - General (Family Medicine) [name], MD as Consulting Physician (Internal Medicine)  Cancer Staging No matching staging information was found for the patient.   Oncology History Overview Note  # 2012- METASTATIC ADENO CA of LEFT LUNG [acinar pattern] s/p MED LN Bx [NEG- EGFR/K-ras/ALK mutation];[name]; [name]; CT JULY  2016- NED; JAN 2018- NED;   # AUG 2020-clinical trial closed; continue maintenance Alimta  # Thoracic aortic aneurysm-4.0 cm in 2016; currently 4.6 cm.  s/p referral to Dr. Faith Rogue.; MRI in 6 months planned.  ----------------------------------------------------------    DIAGNOSIS: [name]  STAGE: 4       ;GOALS: Palliative  CURRENT/MOST RECENT THERAPY _0  Alimta maintenance    Cancer of hilus of left lung ([place])  07/20/2016 Initial Diagnosis   Cancer of hilus of left lung ([place])   12/11/2019 -  Chemotherapy    Patient is on Treatment Plan: LUNG PEMETREXED (ALIMTA) Q21D          INTERVAL HISTORY:  KI CORBO 60 y.o.  male pleasant patient above history of metastatic adenocarcinoma the lung-NED currently on maintenance Alimta is here for follow-up.  Patient denies any worsening shortness of breath or cough.  Denies any nausea vomiting.  Patient admits to intermittent episodes of chest pain midsternal sharp last for few minutes.  No aggravating relieving factors.  Does not notice the pain even when he is playing tennis.  Review of Systems  Constitutional: Negative for chills, diaphoresis, fever, malaise/fatigue and weight loss.  HENT: Negative for nosebleeds and sore throat.   Eyes: Negative for double vision.  Respiratory: Negative for hemoptysis, shortness of breath and wheezing.   Cardiovascular: Positive for leg  swelling. Negative for chest pain, palpitations and orthopnea.  Gastrointestinal: Negative for abdominal pain, blood in stool, constipation, diarrhea, heartburn, melena, nausea and vomiting.  Genitourinary: Negative for dysuria, frequency and urgency.  Musculoskeletal: Positive for joint pain.  Skin: Negative.  Negative for itching and rash.  Neurological: Negative for dizziness, tingling, focal weakness, weakness and headaches.  Endo/Heme/Allergies: Does not bruise/bleed easily.  Psychiatric/Behavioral: Negative for depression. The patient is not nervous/anxious and does not have insomnia.       PAST MEDICAL HISTORY :  Past Medical History:  Diagnosis Date  . Allergy   . Lung cancer ([place])   . Mild valvular heart disease Nov. 2014   per 2 D Echocardiogram done for persistent leg edema, monitored by cardiology  . Peripheral edema   . Thoracic aortic aneurysm (TAA) ([place])     PAST SURGICAL HISTORY :   Past Surgical History:  Procedure Laterality Date  . ELBOW SURGERY Right 2011  . Left anterior thoracotomy with biopsy  March 2012  . PORTACATH PLACEMENT  March 2012  . TONSILLECTOMY  as child    FAMILY HISTORY :   Family History  Problem Relation Age of Onset  . Leukemia Father   . Diabetes Mother     SOCIAL HISTORY:   Social History   Tobacco Use  . Smoking status: Never Smoker  . Smokeless tobacco: Never Used  Substance Use Topics  . Alcohol use: Yes    Alcohol/week: 0.0 standard drinks    Comment: "social drinker"   . Drug use: No    ALLERGIES:  is allergic to penicillins.  MEDICATIONS:  Current Outpatient  Medications  Medication Sig Dispense Refill  . folic acid (FOLVITE) 1 MG tablet TAKE 1 TABLET BY MOUTH EVERY DAY 90 tablet 3  . furosemide (LASIX) 20 MG tablet TAKE 1 TABLET (20 MG TOTAL) BY MOUTH DAILY AS NEEDED FOR SWELLING 90 tablet 1  . meloxicam (MOBIC) 7.5 MG tablet Take 7.5 mg by mouth daily.     . Multiple Vitamin (MULTIVITAMIN) capsule Take 1  capsule by mouth daily. Reported on 02/24/2016    . Nirmatrelvir & Ritonavir 20 x 150 MG & 10 x $Re'100MG'Ywo$  TBPK TAKE 2 NIRMATRELVIR TABLETS AND 1 RITONAVIR TABLET TWICE A DAY FOR 5 DAYS. 30 each 0  . NON FORMULARY Relief Factor with vitamin D and Turmeric    . Omega-3 Fatty Acids (FISH OIL) 1000 MG CAPS Take 1 capsule by mouth daily. Reported on 02/24/2016    . senna (SENOKOT) 8.6 MG tablet Take 1 tablet by mouth daily. Take as needed around time of chemotherapy    . sildenafil (VIAGRA) 100 MG tablet Take 1 tablet (100 mg total) by mouth daily as needed for erectile dysfunction. 20 tablet 11  . spironolactone (ALDACTONE) 25 MG tablet TAKE 1 TABLET BY MOUTH EVERY DAY 90 tablet 3  . tadalafil (CIALIS) 20 MG tablet Take one tablet every 2 days as needed 50 tablet 5  . vitamin B-12 (CYANOCOBALAMIN) 1000 MCG tablet Take 1,000 mcg by mouth daily.     No current facility-administered medications for this visit.   Facility-Administered Medications Ordered in Other Visits  Medication Dose Route Frequency Provider Last Rate Last Admin  . heparin lock flush 100 unit/mL  500 Units Intravenous Once Charlaine Dalton R, [name], MD   10 mL at 05/27/15 1543  . sodium chloride 0.9 % injection 10 mL  10 mL Intracatheter PRN [name], MD   10 mL at 06/17/15 1510  . sodium chloride 0.9 % injection 10 mL  10 mL Intravenous PRN Choksi, Delorise Shiner, MD   10 mL at 07/08/15 1340  . sodium chloride flush (NS) 0.9 % injection 10 mL  10 mL Intravenous PRN Verlon Au, NP   10 mL at 09/30/20 1412    PHYSICAL EXAMINATION: ECOG PERFORMANCE STATUS: 0 - Asymptomatic  BP 124/85   Pulse 82   Temp (!) 97.2 F (36.2 C)   Resp 20   Wt 212 lb 3.2 oz (96.3 kg)   SpO2 100%   BMI 30.45 kg/m   Filed Weights   03/10/21 1353  Weight: 212 lb 3.2 oz (96.3 kg)    Physical Exam HENT:     Head: Normocephalic and atraumatic.     Mouth/Throat:      Pharynx: No oropharyngeal exudate.  Eyes:     Pupils: Pupils are equal, round, and reactive to light.  Cardiovascular:     Rate and Rhythm: Normal rate and regular rhythm.  Pulmonary:     Effort: No respiratory distress.     Breath sounds: No wheezing.  Abdominal:     General: Bowel sounds are normal. There is no distension.     Palpations: Abdomen is soft. There is no mass.     Tenderness: There is no abdominal tenderness. There is no guarding or rebound.  Musculoskeletal:        General: No tenderness. Normal range of motion.     Cervical back: Normal range of motion and neck supple.  Comments: Grade 1 swelling in the legs bilaterally.  Skin:    General: Skin is warm.  Neurological:     Mental Status: He is alert and oriented to person, place, and time.  Psychiatric:        Mood and Affect: Affect normal.      LABORATORY DATA:  I have reviewed the data as listed    Component Value Date/Time   NA 135 03/10/2021 1335   NA 137 04/27/2020 1029   NA 138 12/10/2014 1341   K 3.8 03/10/2021 1335   K 3.6 12/10/2014 1341   CL 100 03/10/2021 1335   CL 102 12/10/2014 1341   CO2 25 03/10/2021 1335   CO2 30 12/10/2014 1341   GLUCOSE 210 (H) 03/10/2021 1335   GLUCOSE 160 (H) 12/10/2014 1341   BUN 22 (H) 03/10/2021 1335   BUN 15 04/27/2020 1029   BUN 21 (H) 12/10/2014 1341   CREATININE 1.09 03/10/2021 1335   CREATININE 1.10 03/25/2015 1453   CALCIUM 8.7 (L) 03/10/2021 1335   CALCIUM 8.4 (L) 12/10/2014 1341   PROT 7.2 03/10/2021 1335   PROT 7.3 04/27/2020 1029   PROT 6.6 12/10/2014 1341   ALBUMIN 4.2 03/10/2021 1335   ALBUMIN 4.7 04/27/2020 1029   ALBUMIN 3.4 12/10/2014 1341   AST 24 03/10/2021 1335   AST 17 12/10/2014 1341   ALT 19 03/10/2021 1335   ALT 23 12/10/2014 1341   ALKPHOS 47 03/10/2021 1335   ALKPHOS 50 12/10/2014 1341   BILITOT 0.8 03/10/2021 1335   BILITOT 0.2 04/27/2020 1029   BILITOT 0.5 12/10/2014 1341   GFRNONAA >60 03/10/2021 1335   GFRNONAA  >60 03/25/2015 1453   GFRAA >60 08/19/2020 1324   GFRAA >60 03/25/2015 1453    No results found for: SPEP, UPEP  Lab Results  Component Value Date   WBC 5.5 03/10/2021   NEUTROABS 2.9 03/10/2021   HGB 14.1 03/10/2021   HCT 41.0 03/10/2021   MCV 91.9 03/10/2021   PLT 166 03/10/2021      Chemistry      Component Value Date/Time   NA 135 03/10/2021 1335   NA 137 04/27/2020 1029   NA 138 12/10/2014 1341   K 3.8 03/10/2021 1335   K 3.6 12/10/2014 1341   CL 100 03/10/2021 1335   CL 102 12/10/2014 1341   CO2 25 03/10/2021 1335   CO2 30 12/10/2014 1341   BUN 22 (H) 03/10/2021 1335   BUN 15 04/27/2020 1029   BUN 21 (H) 12/10/2014 1341   CREATININE 1.09 03/10/2021 1335   CREATININE 1.10 03/25/2015 1453      Component Value Date/Time   CALCIUM 8.7 (L) 03/10/2021 1335   CALCIUM 8.4 (L) 12/10/2014 1341   ALKPHOS 47 03/10/2021 1335   ALKPHOS 50 12/10/2014 1341   AST 24 03/10/2021 1335   AST 17 12/10/2014 1341   ALT 19 03/10/2021 1335   ALT 23 12/10/2014 1341   BILITOT 0.8 03/10/2021 1335   BILITOT 0.2 04/27/2020 1029   BILITOT 0.5 12/10/2014 1341       RADIOGRAPHIC STUDIES: I have personally reviewed the radiological images as listed and agreed with the findings in the report. No results found.   ASSESSMENT & PLAN:  Cancer of hilus of left lung ([place]) # Adenocarcinoma the lung; stage IV;JAN 10th, 2022-  CT scan chest/a/p-shows no evidence of metastatic disease-STABLE.   # Proceed with Alimta chemotherapy. Labs today reviewed;  acceptable for treatment today; B12 every third cycle cycle.   #  intermittent chest pain- ? MSK very mins; no aggravating or reliving- ? covid related; defef to cardiologist [Dr.Paraschoes]  # Swelling in the hands/feet-grade 1 asymptomatic.? Alimta; monitor-STABLE  #Thoracic aneurysm-approximately 4.5 cm in size; on surveillance- STABLE.   # DISPOSITION:  # Alimta today # Follow up in 3 weeks MD-labs- cbc/cmp; alimta Dr.B      No  orders of the defined types were placed in this encounter.  All questions were answered. The patient knows to call the clinic with any problems, questions or concerns.      [name], MD 03/10/2021 2:24 PM

## 2021-03-10 NOTE — Progress Notes (Signed)
Pt received pemetrexed infusion and b12 injection in clinic today. Tolerated well.

## 2021-03-21 ENCOUNTER — Ambulatory Visit: Payer: BC Managed Care – PPO | Admitting: Family Medicine

## 2021-03-21 ENCOUNTER — Encounter: Payer: Self-pay | Admitting: Family Medicine

## 2021-03-21 ENCOUNTER — Other Ambulatory Visit: Payer: Self-pay

## 2021-03-21 VITALS — BP 116/82 | HR 85 | Temp 98.0°F | Resp 16 | Ht 70.0 in | Wt 211.0 lb

## 2021-03-21 DIAGNOSIS — C3402 Malignant neoplasm of left main bronchus: Secondary | ICD-10-CM | POA: Diagnosis not present

## 2021-03-21 DIAGNOSIS — I712 Thoracic aortic aneurysm, without rupture, unspecified: Secondary | ICD-10-CM

## 2021-03-21 DIAGNOSIS — R0789 Other chest pain: Secondary | ICD-10-CM | POA: Diagnosis not present

## 2021-03-21 DIAGNOSIS — M940 Chondrocostal junction syndrome [Tietze]: Secondary | ICD-10-CM | POA: Diagnosis not present

## 2021-03-21 DIAGNOSIS — D696 Thrombocytopenia, unspecified: Secondary | ICD-10-CM | POA: Diagnosis not present

## 2021-03-21 NOTE — Progress Notes (Signed)
I,April Miller,acting as a scribe for Wilhemena Durie, MD.,have documented all relevant documentation on the behalf of Wilhemena Durie, MD,as directed by  Wilhemena Durie, MD while in the presence of Wilhemena Durie, MD.   Established patient visit   Patient: Mitchell Mosley   DOB: 01-24-1961   60 y.o. Male  MRN: [id number] Visit Date: 03/21/2021  Today's healthcare provider: Wilhemena Durie, MD   Chief Complaint  Patient presents with  . Follow-up   Subjective    HPI  Patient has had pressure in his chest for about 3 months. Pressure is in left breast area. Patient states pressure is not all the time. He feels fine, but wanted to get checked out. Patient had a CT scan of chest done 12/12/2020 and MRI has been ordered. The pain is not exertional.  It is intermittent and only lasts a few seconds.  He can put his hand and fingers on it and reproduce the pain.  It is of the left costochondral junction of the mid chest. Overall patient is feeling well.      Medications: Outpatient Medications Prior to Visit  Medication Sig  . folic acid (FOLVITE) 1 MG tablet TAKE 1 TABLET BY MOUTH EVERY DAY  . furosemide (LASIX) 20 MG tablet TAKE 1 TABLET (20 MG TOTAL) BY MOUTH DAILY AS NEEDED FOR SWELLING  . meloxicam (MOBIC) 7.5 MG tablet Take 7.5 mg by mouth daily.   . Multiple Vitamin (MULTIVITAMIN) capsule Take 1 capsule by mouth daily. Reported on 02/24/2016  . Nirmatrelvir & Ritonavir 20 x 150 MG & 10 x 100MG  TBPK TAKE 2 NIRMATRELVIR TABLETS AND 1 RITONAVIR TABLET TWICE A DAY FOR 5 DAYS.  . NON FORMULARY Relief Factor with vitamin D and Turmeric  . Omega-3 Fatty Acids (FISH OIL) 1000 MG CAPS Take 1 capsule by mouth daily. Reported on 02/24/2016  . senna (SENOKOT) 8.6 MG tablet Take 1 tablet by mouth daily. Take as needed around time of chemotherapy  . sildenafil (VIAGRA) 100 MG tablet Take 1 tablet (100 mg total) by mouth daily as needed for erectile dysfunction.  Marland Kitchen  spironolactone (ALDACTONE) 25 MG tablet TAKE 1 TABLET BY MOUTH EVERY DAY  . tadalafil (CIALIS) 20 MG tablet Take one tablet every 2 days as needed  . vitamin B-12 (CYANOCOBALAMIN) 1000 MCG tablet Take 1,000 mcg by mouth daily.   Facility-Administered Medications Prior to Visit  Medication Dose Route Frequency Provider  . heparin lock flush 100 unit/mL  500 Units Intravenous Once Charlaine Dalton R, MD  . sodium chloride 0.9 % injection 10 mL  10 mL Intracatheter PRN [name], MD  . sodium chloride 0.9 % injection 10 mL  10 mL Intracatheter PRN [name], MD  . sodium chloride 0.9 % injection 10 mL  10 mL Intravenous PRN Choksi, Janak, MD  . sodium chloride flush (NS) 0.9 % injection 10 mL  10 mL Intravenous PRN Verlon Au, NP    Review of Systems  Constitutional: Negative for appetite change, chills and fever.  Respiratory: Negative for chest tightness, shortness of breath and wheezing.   Cardiovascular: Negative for chest pain and palpitations.  Gastrointestinal: Negative for abdominal pain, nausea and vomiting.        Objective    BP 116/82 (BP Location: Left Arm, Patient Position: Sitting, Cuff Size: Large)   Pulse 85   Temp 98 F (36.7 C) (Oral)   Resp 16   Ht 5\' 10"  (1.778 m)  Wt 211 lb (95.7 kg)   SpO2 98%   BMI 30.28 kg/m  BP Readings from Last 3 Encounters:  03/21/21 116/82  03/10/21 124/85  02/17/21 121/73   Wt Readings from Last 3 Encounters:  03/21/21 211 lb (95.7 kg)  03/10/21 212 lb 3.2 oz (96.3 kg)  02/17/21 210 lb 3.2 oz (95.3 kg)       Physical Exam Vitals and nursing note reviewed. Exam conducted with a chaperone present.  Constitutional:      Appearance: Normal appearance. He is normal weight.  HENT:     Right Ear: Tympanic membrane normal.     Left Ear: Tympanic membrane normal.     Nose: Nose normal.     Mouth/Throat:     Mouth: Mucous membranes are moist.  Cardiovascular:     Rate and Rhythm: Normal rate and regular  rhythm.     Pulses: Normal pulses.     Heart sounds: Normal heart sounds.  Pulmonary:     Effort: Pulmonary effort is normal.     Breath sounds: Normal breath sounds.     Comments: Chest wall is mildly tender in the mid left costochondral junction.  This reproduces the discomfort the patient is having. Abdominal:     General: Bowel sounds are normal.     Palpations: Abdomen is soft.  Musculoskeletal:        General: Normal range of motion.     Cervical back: Normal range of motion and neck supple.     Comments: Trace edema  Skin:    General: Skin is warm.  Neurological:     Mental Status: He is alert.  Psychiatric:        Mood and Affect: Mood normal.        Behavior: Behavior normal.   ECG reveals normal sinus rhythm with no ischemic changes a rate of about 76.   No results found for any visits on 03/21/21.  Assessment & Plan     1. Other chest pain Costochondritis most likely etiology.  I do not think this is cardiac or vascular i.e. thoracic aneurysm - EKG 12-Lead  2. Costochondritis   3. Cancer of hilus of left lung ([place]) Followed by oncology  4. Thrombocytopenia ([place])   5. Thoracic aortic aneurysm without rupture ([place]) Followed by oncology and routine scans. Annual physical later in 2022  No follow-ups on file.      I, Wilhemena Durie, MD, have reviewed all documentation for this visit. The documentation on 03/27/21 for the exam, diagnosis, procedures, and orders are all accurate and complete.    Kaymarie Wynn Cranford Mon, MD  [facility] [phone] (phone) [phone] (fax)  [place]

## 2021-03-31 ENCOUNTER — Inpatient Hospital Stay: Payer: BC Managed Care – PPO

## 2021-03-31 ENCOUNTER — Inpatient Hospital Stay: Payer: BC Managed Care – PPO | Admitting: Internal Medicine

## 2021-03-31 ENCOUNTER — Encounter: Payer: Self-pay | Admitting: *Deleted

## 2021-03-31 NOTE — Assessment & Plan Note (Deleted)
#   Adenocarcinoma the lung; stage IV;JAN 10th, 2022-  CT scan chest/a/p-shows no evidence of metastatic disease-STABLE.   # Proceed with Alimta chemotherapy. Labs today reviewed;  acceptable for treatment today; B12 every third cycle cycle.   # intermittent chest pain- ? MSK-atypical cardiac ? covid related; defer to cardiologist [Dr.Paraschoes]  # Swelling in the hands/feet-grade 1 asymptomatic.? Alimta; monitor-STABLE  #Thoracic aneurysm-approximately 4.5 cm in size; on surveillance- STABLE.   # DISPOSITION:  # Alimta today # Follow up in 3 weeks MD-labs- cbc/cmp; alimta Dr.B

## 2021-03-31 NOTE — Progress Notes (Deleted)
[facility] OFFICE PROGRESS NOTE  Patient Care Team: [name], MD as PCP - General (Family Medicine) [name], MD as Consulting Physician (Internal Medicine)  Cancer Staging No matching staging information was found for the patient.   Oncology History Overview Note  # 2012- METASTATIC ADENO CA of LEFT LUNG [acinar pattern] s/p MED LN Bx [NEG- EGFR/K-ras/ALK mutation];[name]; [name]; CT JULY  2016- NED; JAN 2018- NED;   # AUG 2020-clinical trial closed; continue maintenance Alimta  # Thoracic aortic aneurysm-4.0 cm in 2016; currently 4.6 cm.  s/p referral to Dr. Faith Rogue.; MRI in 6 months planned.  ----------------------------------------------------------    DIAGNOSIS: [name]  STAGE: 4       ;GOALS: Palliative  CURRENT/MOST RECENT THERAPY _0  Alimta maintenance    Cancer of hilus of left lung ([place])  07/20/2016 Initial Diagnosis   Cancer of hilus of left lung ([place])   12/11/2019 -  Chemotherapy    Patient is on Treatment Plan: LUNG PEMETREXED (ALIMTA) Q21D          INTERVAL HISTORY:  Mitchell Mosley 60 y.o.  male pleasant patient above history of metastatic adenocarcinoma the lung-NED currently on maintenance Alimta is here for follow-up.  Patient denies any worsening shortness of breath or cough.  Denies any nausea vomiting.  Patient admits to intermittent episodes of chest pain midsternal sharp last for few minutes.  No aggravating relieving factors.  Does not notice the pain even when he is playing tennis.  Review of Systems  Constitutional: Negative for chills, diaphoresis, fever, malaise/fatigue and weight loss.  HENT: Negative for nosebleeds and sore throat.   Eyes: Negative for double vision.  Respiratory: Negative for hemoptysis, shortness of breath and wheezing.   Cardiovascular: Positive for leg  swelling. Negative for chest pain, palpitations and orthopnea.  Gastrointestinal: Negative for abdominal pain, blood in stool, constipation, diarrhea, heartburn, melena, nausea and vomiting.  Genitourinary: Negative for dysuria, frequency and urgency.  Musculoskeletal: Positive for joint pain.  Skin: Negative.  Negative for itching and rash.  Neurological: Negative for dizziness, tingling, focal weakness, weakness and headaches.  Endo/Heme/Allergies: Does not bruise/bleed easily.  Psychiatric/Behavioral: Negative for depression. The patient is not nervous/anxious and does not have insomnia.       PAST MEDICAL HISTORY :  Past Medical History:  Diagnosis Date  . Allergy   . Lung cancer ([place])   . Mild valvular heart disease Nov. 2014   per 2 D Echocardiogram done for persistent leg edema, monitored by cardiology  . Peripheral edema   . Thoracic aortic aneurysm (TAA) ([place])     PAST SURGICAL HISTORY :   Past Surgical History:  Procedure Laterality Date  . ELBOW SURGERY Right 2011  . Left anterior thoracotomy with biopsy  March 2012  . PORTACATH PLACEMENT  March 2012  . TONSILLECTOMY  as child    FAMILY HISTORY :   Family History  Problem Relation Age of Onset  . Leukemia Father   . Diabetes Mother     SOCIAL HISTORY:   Social History   Tobacco Use  . Smoking status: Never Smoker  . Smokeless tobacco: Never Used  Substance Use Topics  . Alcohol use: Yes    Alcohol/week: 0.0 standard drinks    Comment: "social drinker"   . Drug use: No    ALLERGIES:  is allergic to penicillins.  MEDICATIONS:  Current Outpatient  Medications  Medication Sig Dispense Refill  . folic acid (FOLVITE) 1 MG tablet TAKE 1 TABLET BY MOUTH EVERY DAY 90 tablet 3  . furosemide (LASIX) 20 MG tablet TAKE 1 TABLET (20 MG TOTAL) BY MOUTH DAILY AS NEEDED FOR SWELLING 90 tablet 1  . meloxicam (MOBIC) 7.5 MG tablet Take 7.5 mg by mouth daily.     . Multiple Vitamin (MULTIVITAMIN) capsule Take 1  capsule by mouth daily. Reported on 02/24/2016    . Nirmatrelvir & Ritonavir 20 x 150 MG & 10 x 100MG TBPK TAKE 2 NIRMATRELVIR TABLETS AND 1 RITONAVIR TABLET TWICE A DAY FOR 5 DAYS. 30 each 0  . NON FORMULARY Relief Factor with vitamin D and Turmeric    . Omega-3 Fatty Acids (FISH OIL) 1000 MG CAPS Take 1 capsule by mouth daily. Reported on 02/24/2016    . senna (SENOKOT) 8.6 MG tablet Take 1 tablet by mouth daily. Take as needed around time of chemotherapy    . sildenafil (VIAGRA) 100 MG tablet Take 1 tablet (100 mg total) by mouth daily as needed for erectile dysfunction. 20 tablet 11  . spironolactone (ALDACTONE) 25 MG tablet TAKE 1 TABLET BY MOUTH EVERY DAY 90 tablet 3  . tadalafil (CIALIS) 20 MG tablet Take one tablet every 2 days as needed 50 tablet 5  . vitamin B-12 (CYANOCOBALAMIN) 1000 MCG tablet Take 1,000 mcg by mouth daily.     No current facility-administered medications for this visit.   Facility-Administered Medications Ordered in Other Visits  Medication Dose Route Frequency Provider Last Rate Last Admin  . heparin lock flush 100 unit/mL  500 Units Intravenous Once Charlaine Dalton R, [name], MD   10 mL at 05/27/15 1543  . sodium chloride 0.9 % injection 10 mL  10 mL Intracatheter PRN [name], MD   10 mL at 06/17/15 1510  . sodium chloride 0.9 % injection 10 mL  10 mL Intravenous PRN Choksi, Delorise Shiner, MD   10 mL at 07/08/15 1340  . sodium chloride flush (NS) 0.9 % injection 10 mL  10 mL Intravenous PRN Verlon Au, NP   10 mL at 09/30/20 1412    PHYSICAL EXAMINATION: ECOG PERFORMANCE STATUS: 0 - Asymptomatic  There were no vitals taken for this visit.  There were no vitals filed for this visit.  Physical Exam HENT:     Head: Normocephalic and atraumatic.     Mouth/Throat:     Pharynx: No oropharyngeal exudate.  Eyes:     Pupils: Pupils are equal, round, and reactive to light.   Cardiovascular:     Rate and Rhythm: Normal rate and regular rhythm.  Pulmonary:     Effort: No respiratory distress.     Breath sounds: No wheezing.  Abdominal:     General: Bowel sounds are normal. There is no distension.     Palpations: Abdomen is soft. There is no mass.     Tenderness: There is no abdominal tenderness. There is no guarding or rebound.  Musculoskeletal:        General: No tenderness. Normal range of motion.     Cervical back: Normal range of motion and neck supple.     Comments: Grade 1 swelling in the legs bilaterally.  Skin:    General: Skin is warm.  Neurological:     Mental Status: He is alert and oriented to person, place,  and time.  Psychiatric:        Mood and Affect: Affect normal.      LABORATORY DATA:  I have reviewed the data as listed    Component Value Date/Time   NA 135 03/10/2021 1335   NA 137 04/27/2020 1029   NA 138 12/10/2014 1341   K 3.8 03/10/2021 1335   K 3.6 12/10/2014 1341   CL 100 03/10/2021 1335   CL 102 12/10/2014 1341   CO2 25 03/10/2021 1335   CO2 30 12/10/2014 1341   GLUCOSE 210 (H) 03/10/2021 1335   GLUCOSE 160 (H) 12/10/2014 1341   BUN 22 (H) 03/10/2021 1335   BUN 15 04/27/2020 1029   BUN 21 (H) 12/10/2014 1341   CREATININE 1.09 03/10/2021 1335   CREATININE 1.10 03/25/2015 1453   CALCIUM 8.7 (L) 03/10/2021 1335   CALCIUM 8.4 (L) 12/10/2014 1341   PROT 7.2 03/10/2021 1335   PROT 7.3 04/27/2020 1029   PROT 6.6 12/10/2014 1341   ALBUMIN 4.2 03/10/2021 1335   ALBUMIN 4.7 04/27/2020 1029   ALBUMIN 3.4 12/10/2014 1341   AST 24 03/10/2021 1335   AST 17 12/10/2014 1341   ALT 19 03/10/2021 1335   ALT 23 12/10/2014 1341   ALKPHOS 47 03/10/2021 1335   ALKPHOS 50 12/10/2014 1341   BILITOT 0.8 03/10/2021 1335   BILITOT 0.2 04/27/2020 1029   BILITOT 0.5 12/10/2014 1341   GFRNONAA >60 03/10/2021 1335   GFRNONAA >60 03/25/2015 1453   GFRAA >60 08/19/2020 1324   GFRAA >60 03/25/2015 1453    No results found for:  SPEP, UPEP  Lab Results  Component Value Date   WBC 5.5 03/10/2021   NEUTROABS 2.9 03/10/2021   HGB 14.1 03/10/2021   HCT 41.0 03/10/2021   MCV 91.9 03/10/2021   PLT 166 03/10/2021      Chemistry      Component Value Date/Time   NA 135 03/10/2021 1335   NA 137 04/27/2020 1029   NA 138 12/10/2014 1341   K 3.8 03/10/2021 1335   K 3.6 12/10/2014 1341   CL 100 03/10/2021 1335   CL 102 12/10/2014 1341   CO2 25 03/10/2021 1335   CO2 30 12/10/2014 1341   BUN 22 (H) 03/10/2021 1335   BUN 15 04/27/2020 1029   BUN 21 (H) 12/10/2014 1341   CREATININE 1.09 03/10/2021 1335   CREATININE 1.10 03/25/2015 1453      Component Value Date/Time   CALCIUM 8.7 (L) 03/10/2021 1335   CALCIUM 8.4 (L) 12/10/2014 1341   ALKPHOS 47 03/10/2021 1335   ALKPHOS 50 12/10/2014 1341   AST 24 03/10/2021 1335   AST 17 12/10/2014 1341   ALT 19 03/10/2021 1335   ALT 23 12/10/2014 1341   BILITOT 0.8 03/10/2021 1335   BILITOT 0.2 04/27/2020 1029   BILITOT 0.5 12/10/2014 1341       RADIOGRAPHIC STUDIES: I have personally reviewed the radiological images as listed and agreed with the findings in the report. No results found.   ASSESSMENT & PLAN:  No problem-specific Assessment & Plan notes found for this encounter.   No orders of the defined types were placed in this encounter.  All questions were answered. The patient knows to call the clinic with any problems, questions or concerns.      [name], MD 03/31/2021 1:36 PM

## 2021-05-02 ENCOUNTER — Encounter: Payer: Self-pay | Admitting: Family Medicine

## 2021-05-10 DIAGNOSIS — C3492 Malignant neoplasm of unspecified part of left bronchus or lung: Secondary | ICD-10-CM | POA: Diagnosis not present

## 2021-05-10 DIAGNOSIS — Z09 Encounter for follow-up examination after completed treatment for conditions other than malignant neoplasm: Secondary | ICD-10-CM | POA: Diagnosis not present

## 2021-05-10 DIAGNOSIS — R609 Edema, unspecified: Secondary | ICD-10-CM | POA: Diagnosis not present

## 2021-05-25 ENCOUNTER — Other Ambulatory Visit: Payer: Self-pay | Admitting: Family Medicine

## 2021-05-25 DIAGNOSIS — N529 Male erectile dysfunction, unspecified: Secondary | ICD-10-CM

## 2021-06-19 ENCOUNTER — Ambulatory Visit (INDEPENDENT_AMBULATORY_CARE_PROVIDER_SITE_OTHER): Payer: BC Managed Care – PPO | Admitting: Family Medicine

## 2021-06-19 ENCOUNTER — Encounter: Payer: Self-pay | Admitting: Family Medicine

## 2021-06-19 ENCOUNTER — Other Ambulatory Visit: Payer: Self-pay

## 2021-06-19 VITALS — BP 123/81 | HR 83 | Temp 99.0°F | Resp 16 | Ht 70.0 in | Wt 210.0 lb

## 2021-06-19 DIAGNOSIS — R600 Localized edema: Secondary | ICD-10-CM

## 2021-06-19 DIAGNOSIS — C3402 Malignant neoplasm of left main bronchus: Secondary | ICD-10-CM

## 2021-06-19 DIAGNOSIS — Z1211 Encounter for screening for malignant neoplasm of colon: Secondary | ICD-10-CM

## 2021-06-19 DIAGNOSIS — Z0000 Encounter for general adult medical examination without abnormal findings: Secondary | ICD-10-CM | POA: Diagnosis not present

## 2021-06-19 NOTE — Progress Notes (Signed)
I,Mitchell Mosley,acting as a scribe for Mitchell Durie, MD.,have documented all relevant documentation on the behalf of Mitchell Durie, MD,as directed by  Mitchell Durie, MD while in the presence of Mitchell Durie, MD.   Complete physical exam   Patient: Mitchell Mosley   DOB: 11-15-1961   60 y.o. Male  MRN: [id number] Visit Date: 06/19/2021  Today's healthcare provider: Wilhemena Durie, MD   Chief Complaint  Patient presents with   Annual Exam   Subjective    Mitchell Mosley is a 60 y.o. male who presents today for a complete physical exam.  He reports consuming a general diet. Home exercise routine includes Tennis. He generally feels well. He reports sleeping well. He does not have additional problems to discuss today.  HPI  Is being chronically treated by oncology for lung cancer.  He has had 2 COVID vaccines and then had COVID in January 2022. He feels well and continues to remain active playing tennis regularly.  Past Medical History:  Diagnosis Date   Allergy    Lung cancer ([place])    Mild valvular heart disease Nov. 2014   per 2 D Echocardiogram done for persistent leg edema, monitored by cardiology   Peripheral edema    Thoracic aortic aneurysm (TAA) [facility])    Past Surgical History:  Procedure Laterality Date   ELBOW SURGERY Right 2011   Left anterior thoracotomy with biopsy  March 2012   PORTACATH PLACEMENT  March 2012   TONSILLECTOMY  as child   Social History   Socioeconomic History   Marital status: Married    Spouse name: Not on file   Number of children: Not on file   Years of education: Not on file   Highest education level: Not on file  Occupational History   Not on file  Tobacco Use   Smoking status: Never   Smokeless tobacco: Never  Substance and Sexual Activity   Alcohol use: Yes    Alcohol/week: 0.0 standard drinks    Comment: "social drinker"    Drug use: No   Sexual activity: Yes  Other Topics Concern   Not on file   Social History Narrative   Not on file   Social Determinants of Health   Financial Resource Strain: Not on file  Food Insecurity: Not on file  Transportation Needs: Not on file  Physical Activity: Not on file  Stress: Not on file  Social Connections: Not on file  Intimate Partner Violence: Not on file   Family Status  Relation Name Status   Father  Deceased at age 76   Mother  Alive   Sister  Alive   Brother  Alive   Brother  Mitchell Mosley   Family History  Problem Relation Age of Onset   Leukemia Father    Diabetes Mother    Allergies  Allergen Reactions   Penicillins     Reaction and severity unknown    Patient Care Team: [name], MD as PCP - General (Family Medicine) [name], MD as Consulting Physician (Internal Medicine)   Medications: Outpatient Medications Prior to Visit  Medication Sig   folic acid (FOLVITE) 1 MG tablet TAKE 1 TABLET BY MOUTH EVERY DAY   furosemide (LASIX) 20 MG tablet TAKE 1 TABLET (20 MG TOTAL) BY MOUTH DAILY AS NEEDED FOR SWELLING   meloxicam (MOBIC) 7.5 MG tablet Take 7.5 mg by mouth daily.    Multiple  Vitamin (MULTIVITAMIN) capsule Take 1 capsule by mouth daily. Reported on 02/24/2016   Nirmatrelvir & Ritonavir 20 x 150 MG & 10 x 100MG  TBPK TAKE 2 NIRMATRELVIR TABLETS AND 1 RITONAVIR TABLET TWICE A DAY FOR 5 DAYS.   NON FORMULARY Relief Factor with vitamin D and Turmeric   Omega-3 Fatty Acids (FISH OIL) 1000 MG CAPS Take 1 capsule by mouth daily. Reported on 02/24/2016   senna (SENOKOT) 8.6 MG tablet Take 1 tablet by mouth daily. Take as needed around time of chemotherapy   sildenafil (VIAGRA) 100 MG tablet TAKE 1 TABLET BY MOUTH EVERY DAY AS NEEDED FOR ERECTILE DYSFUNCTION   spironolactone (ALDACTONE) 25 MG tablet TAKE 1 TABLET BY MOUTH EVERY DAY   tadalafil (CIALIS) 20 MG tablet Take one tablet every 2 days as needed   vitamin B-12 (CYANOCOBALAMIN) 1000 MCG tablet Take 1,000 mcg by mouth daily.    Facility-Administered Medications Prior to Visit  Medication Dose Route Frequency Provider   heparin lock flush 100 unit/mL  500 Units Intravenous Once Charlaine Dalton R, MD   sodium chloride 0.9 % injection 10 mL  10 mL Intracatheter PRN [name], MD   sodium chloride 0.9 % injection 10 mL  10 mL Intracatheter PRN [name], MD   sodium chloride 0.9 % injection 10 mL  10 mL Intravenous PRN Choksi, Janak, MD   sodium chloride flush (NS) 0.9 % injection 10 mL  10 mL Intravenous PRN Verlon Au, NP    Review of Systems  All other systems reviewed and are negative.     Objective    BP 123/81 (BP Location: Left Arm, Patient Position: Sitting, Cuff Size: Large)   Pulse 83   Temp 99 F (37.2 C) (Oral)   Resp 16   Ht 5\' 10"  (1.778 m)   Wt 210 lb (95.3 kg)   SpO2 97%   BMI 30.13 kg/m  BP Readings from Last 3 Encounters:  06/19/21 123/81  03/21/21 116/82  03/10/21 124/85   Wt Readings from Last 3 Encounters:  06/19/21 210 lb (95.3 kg)  03/21/21 211 lb (95.7 kg)  03/10/21 212 lb 3.2 oz (96.3 kg)      Physical Exam Vitals and nursing note reviewed. Exam conducted with a chaperone present.  Constitutional:      Appearance: Normal appearance. He is normal weight.  HENT:     Head: Normocephalic and atraumatic.     Right Ear: Ear canal and external ear normal.     Left Ear: Tympanic membrane, ear canal and external ear normal.     Ears:     Comments: Small hole in the right TM    Nose: Nose normal.     Mouth/Throat:     Mouth: Mucous membranes are moist.  Eyes:     General: No scleral icterus.    Conjunctiva/sclera: Conjunctivae normal.  Cardiovascular:     Rate and Rhythm: Normal rate and regular rhythm.     Pulses: Normal pulses.     Heart sounds: Normal heart sounds.  Pulmonary:     Effort: Pulmonary effort is normal.     Breath sounds: Normal breath sounds.  Abdominal:     General: Bowel sounds are normal.     Palpations: Abdomen is soft.   Genitourinary:    Penis: Normal.      Testes: Normal.  Musculoskeletal:     Cervical back: Normal range of motion and neck supple.     Comments: Trace edema  Skin:  General: Skin is warm and dry.  Neurological:     General: No focal deficit present.     Mental Status: He is alert and oriented to person, place, and time.  Psychiatric:        Mood and Affect: Mood normal.        Behavior: Behavior normal.        Thought Content: Thought content normal.        Judgment: Judgment normal.      Last depression screening scores PHQ 2/9 Scores 06/19/2021 04/27/2020 03/31/2020  PHQ - 2 Score 0 0 0  PHQ- 9 Score 1 0 0   Last fall risk screening Fall Risk  06/19/2021  Falls in the past year? 0  Number falls in past yr: 0  Injury with Fall? 0  Risk for fall due to : No Fall Risks  Follow up Falls evaluation completed   Last Audit-C alcohol use screening Alcohol Use Disorder Test (AUDIT) 06/19/2021  1. How often do you have a drink containing alcohol? 3  2. How many drinks containing alcohol do you have on a typical day when you are drinking? 0  3. How often do you have six or more drinks on one occasion? 1  AUDIT-C Score 4  4. How often during the last year have you found that you were not able to stop drinking once you had started? 0  5. How often during the last year have you failed to do what was normally expected from you because of drinking? 0  6. How often during the last year have you needed a first drink in the morning to get yourself going after a heavy drinking session? 0  7. How often during the last year have you had a feeling of guilt of remorse after drinking? 0  8. How often during the last year have you been unable to remember what happened the night before because you had been drinking? 0  9. Have you or someone else been injured as a result of your drinking? 0  10. Has a relative or friend or a doctor or another health worker been concerned about your drinking or  suggested you cut down? 0  Alcohol Use Disorder Identification Test Final Score (AUDIT) 4   A score of 3 or more in women, and 4 or more in men indicates increased risk for alcohol abuse, EXCEPT if all of the points are from question 1   No results found for any visits on 06/19/21.  Assessment & Plan    Routine Health Maintenance and Physical Exam  Exercise Activities and Dietary recommendations  Goals   None     Immunization History  Administered Date(s) Administered   Influenza,inj,Quad PF,6+ Mos 08/19/2015, 09/21/2016, 10/07/2017, 09/05/2018, 10/09/2019   PFIZER(Purple Top)SARS-COV-2 Vaccination 02/05/2020, 02/26/2020   Tdap 03/16/2015    Health Maintenance  Topic Date Due   Pneumococcal Vaccine 34-63 Years old (1 - PCV) Never done   HIV Screening  Never done   Hepatitis C Screening  Never done   Zoster Vaccines- Shingrix (1 of 2) Never done   COLONOSCOPY (Pts 45-75yrs Insurance coverage will need to be confirmed)  Never done   COVID-19 Vaccine (3 - Pfizer risk series) 03/25/2020   INFLUENZA VACCINE  07/03/2021   TETANUS/TDAP  03/15/2025   HPV VACCINES  Aged Out    Discussed health benefits of physical activity, and encouraged him to engage in regular exercise appropriate for his age and condition.  1. Annual  physical exam Follow-up 1 year. - Lipid panel - TSH - CBC w/Diff/Platelet - Comprehensive Metabolic Panel (CMET) - PSA  2. Screening for colon cancer Time for follow-up colonoscopy for screening purposes - Ambulatory referral to Gastroenterology  3. Bilateral lower extremity edema Clinically stable.  4. Cancer of hilus of left lung (HCC) Regular chemotherapy getting ready to resume per oncology/clinically has been stable   No follow-ups on file.     I, Mitchell Durie, MD, have reviewed all documentation for this visit. The documentation on 06/24/21 for the exam, diagnosis, procedures, and orders are all accurate and complete.    Emilliano Dilworth  Cranford Mon, MD  [facility] [phone] (phone) [phone] (fax)  [place]

## 2021-06-20 ENCOUNTER — Encounter: Payer: Self-pay | Admitting: Internal Medicine

## 2021-06-20 LAB — LIPID PANEL
Chol/HDL Ratio: 5.6 ratio — ABNORMAL HIGH (ref 0.0–5.0)
Cholesterol, Total: 319 mg/dL — ABNORMAL HIGH (ref 100–199)
HDL: 57 mg/dL (ref >39)
LDL Chol Calc (NIH): 181 mg/dL — ABNORMAL HIGH (ref 0–99)
Triglycerides: 409 mg/dL — ABNORMAL HIGH (ref 0–149)
VLDL Cholesterol Cal: 81 mg/dL — ABNORMAL HIGH (ref 5–40)

## 2021-06-20 LAB — COMPREHENSIVE METABOLIC PANEL
ALT: 19 IU/L (ref 0–44)
AST: 28 IU/L (ref 0–40)
Albumin/Globulin Ratio: 1.6 (ref 1.2–2.2)
Albumin: 4.5 g/dL (ref 3.8–4.9)
Alkaline Phosphatase: 57 IU/L (ref 44–121)
BUN/Creatinine Ratio: 18 (ref 9–20)
BUN: 18 mg/dL (ref 6–24)
Bilirubin Total: 0.2 mg/dL (ref 0.0–1.2)
CO2: 25 mmol/L (ref 20–29)
Calcium: 9.7 mg/dL (ref 8.7–10.2)
Chloride: 97 mmol/L (ref 96–106)
Creatinine, Ser: 1 mg/dL (ref 0.76–1.27)
Globulin, Total: 2.8 g/dL (ref 1.5–4.5)
Glucose: 117 mg/dL — ABNORMAL HIGH (ref 65–99)
Potassium: 3.9 mmol/L (ref 3.5–5.2)
Sodium: 137 mmol/L (ref 134–144)
Total Protein: 7.3 g/dL (ref 6.0–8.5)
eGFR: 87 mL/min/{1.73_m2} (ref >59)

## 2021-06-20 LAB — PSA: Prostate Specific Ag, Serum: 1.7 ng/mL (ref 0.0–4.0)

## 2021-06-20 LAB — CBC WITH DIFFERENTIAL/PLATELET
Basophils Absolute: 0 10*3/uL (ref 0.0–0.2)
Basos: 1 %
EOS (ABSOLUTE): 0.2 10*3/uL (ref 0.0–0.4)
Eos: 4 %
Hematocrit: 48.3 % (ref 37.5–51.0)
Hemoglobin: 16.3 g/dL (ref 13.0–17.7)
Immature Grans (Abs): 0 10*3/uL (ref 0.0–0.1)
Immature Granulocytes: 0 %
Lymphocytes Absolute: 1.7 10*3/uL (ref 0.7–3.1)
Lymphs: 35 %
MCH: 31.3 pg (ref 26.6–33.0)
MCHC: 33.7 g/dL (ref 31.5–35.7)
MCV: 93 fL (ref 79–97)
Monocytes Absolute: 0.4 10*3/uL (ref 0.1–0.9)
Monocytes: 9 %
Neutrophils Absolute: 2.6 10*3/uL (ref 1.4–7.0)
Neutrophils: 51 %
Platelets: 156 10*3/uL (ref 150–450)
RBC: 5.2 x10E6/uL (ref 4.14–5.80)
RDW: 12.4 % (ref 11.6–15.4)
WBC: 4.9 10*3/uL (ref 3.4–10.8)

## 2021-06-20 LAB — TSH: TSH: 2.19 u[IU]/mL (ref 0.450–4.500)

## 2021-06-22 ENCOUNTER — Encounter: Payer: Self-pay | Admitting: *Deleted

## 2021-06-23 ENCOUNTER — Encounter: Payer: Self-pay | Admitting: *Deleted

## 2021-07-04 ENCOUNTER — Telehealth: Payer: Self-pay | Admitting: *Deleted

## 2021-07-04 NOTE — Telephone Encounter (Signed)
Patient finally responded to RNs msgs via mychart. Patient would like to come on 07/21/21 for lab/md/alimita (port lab)  [place], please arrange. I told the patient already that the apt would most likely need to be in the am b/c Dr. B does not have any openings that afternoon. - [facility]- please call patient with new apts.

## 2021-07-11 ENCOUNTER — Ambulatory Visit (INDEPENDENT_AMBULATORY_CARE_PROVIDER_SITE_OTHER): Payer: BC Managed Care – PPO | Admitting: Family Medicine

## 2021-07-11 ENCOUNTER — Other Ambulatory Visit: Payer: Self-pay

## 2021-07-11 ENCOUNTER — Encounter: Payer: Self-pay | Admitting: Family Medicine

## 2021-07-11 ENCOUNTER — Ambulatory Visit (INDEPENDENT_AMBULATORY_CARE_PROVIDER_SITE_OTHER): Payer: BC Managed Care – PPO

## 2021-07-11 VITALS — BP 124/82 | HR 88 | Ht 70.0 in | Wt 207.0 lb

## 2021-07-11 DIAGNOSIS — M545 Low back pain, unspecified: Secondary | ICD-10-CM | POA: Diagnosis not present

## 2021-07-11 DIAGNOSIS — M9904 Segmental and somatic dysfunction of sacral region: Secondary | ICD-10-CM

## 2021-07-11 DIAGNOSIS — M9902 Segmental and somatic dysfunction of thoracic region: Secondary | ICD-10-CM

## 2021-07-11 DIAGNOSIS — G8929 Other chronic pain: Secondary | ICD-10-CM

## 2021-07-11 DIAGNOSIS — M9903 Segmental and somatic dysfunction of lumbar region: Secondary | ICD-10-CM

## 2021-07-11 NOTE — Patient Instructions (Addendum)
Good to see you  Tart cherry extract 1200mg  at night Back exercises given Continue the other vitamins that you are doing Ice if you have any discomfort Keep wearing the good shoes and orthotics See me again in 8-10 weeks

## 2021-07-11 NOTE — Progress Notes (Signed)
[facility] [place] Phone: [phone] Subjective:   Mitchell Mosley, am serving as a scribe for Dr. Hulan Saas.  I'm seeing this patient by the request  of:  [name], MD  CC: back pain   [id number]  Mitchell Mosley is a 60 y.o. male coming in with complaint of back pain.  Being treated for lung cancer including chemotherapy. 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 States that once he gets loosened up he is okay but morning and night when he is not so active he can barely bend over. Patient states he has bilateral knee and foot pain and is not sure if the back could be causing that.     Patient did have a CT chest abdomen and pelvis done in January 2022.  This was independently visualized by me showing the patient did not have any bony abnormalities that was fairly significant except for mild degenerative disc disease.   ASK ABOUT THE THORACIC Aneurism- are they seeing CVTS    Past Medical History:  Diagnosis Date   Allergy    Lung cancer ([place])    Mild valvular heart disease Nov. 2014   per 2 D Echocardiogram done for persistent leg edema, monitored by cardiology   Peripheral edema    Thoracic aortic aneurysm (TAA) [facility])    Past Surgical History:  Procedure Laterality Date   ELBOW SURGERY Right 2011   Left anterior thoracotomy with biopsy  March 2012   PORTACATH PLACEMENT  March 2012   TONSILLECTOMY  as child   Social History   Socioeconomic History   Marital status: Married    Spouse name: Not on file   Number of children: Not on file   Years of education: Not on file   Highest education level: Not on file  Occupational History   Not on file  Tobacco Use   Smoking status: Never   Smokeless tobacco: Never  Substance and Sexual Activity   Alcohol use: Yes    Alcohol/week: 0.0 standard drinks    Comment: "social drinker"    Drug use: No   Sexual activity: Yes  Other  Topics Concern   Not on file  Social History Narrative   Not on file   Social Determinants of Health   Financial Resource Strain: Not on file  Food Insecurity: Not on file  Transportation Needs: Not on file  Physical Activity: Not on file  Stress: Not on file  Social Connections: Not on file   Allergies  Allergen Reactions   Penicillins     Reaction and severity unknown   Family History  Problem Relation Age of Onset   Leukemia Father    Diabetes Mother       Current Outpatient Medications (Cardiovascular):    furosemide (LASIX) 20 MG tablet, TAKE 1 TABLET (20 MG TOTAL) BY MOUTH DAILY AS NEEDED FOR SWELLING   sildenafil (VIAGRA) 100 MG tablet, TAKE 1 TABLET BY MOUTH EVERY DAY AS NEEDED FOR ERECTILE DYSFUNCTION   spironolactone (ALDACTONE) 25 MG tablet, TAKE 1 TABLET BY MOUTH EVERY DAY   tadalafil (CIALIS) 20 MG tablet, Take one tablet every 2 days as needed     Current Outpatient Medications (Analgesics):    meloxicam (MOBIC) 7.5 MG tablet, Take 7.5 mg by mouth daily.    Current Outpatient Medications (Hematological):    folic acid (FOLVITE) 1 MG tablet, TAKE 1 TABLET BY MOUTH EVERY DAY  vitamin B-12 (CYANOCOBALAMIN) 1000 MCG tablet, Take 1,000 mcg by mouth daily.   Facility-Administered Medications Ordered in Other Visits (Hematological):    heparin lock flush 100 unit/mL  Current Outpatient Medications (Other):    Multiple Vitamin (MULTIVITAMIN) capsule, Take 1 capsule by mouth daily. Reported on 02/24/2016   NON FORMULARY, Relief Factor with vitamin D and Turmeric   Omega-3 Fatty Acids (FISH OIL) 1000 MG CAPS, Take 1 capsule by mouth daily. Reported on 02/24/2016   senna (SENOKOT) 8.6 MG tablet, Take 1 tablet by mouth daily. Take as needed around time of chemotherapy   Nirmatrelvir & Ritonavir 20 x 150 MG & 10 x 100MG  TBPK, TAKE 2 NIRMATRELVIR TABLETS AND 1 RITONAVIR TABLET TWICE A DAY FOR 5 DAYS. (Patient not taking: Reported on  07/11/2021)   Facility-Administered Medications Ordered in Other Visits (Other):    sodium chloride 0.9 % injection 10 mL   sodium chloride 0.9 % injection 10 mL   sodium chloride 0.9 % injection 10 mL   sodium chloride flush (NS) 0.9 % injection 10 mL No current facility-administered medications for this visit.   Reviewed prior external information including notes and imaging from  primary care provider As well as notes that were available from care everywhere and other healthcare systems.  Past medical history, social, surgical and family history all reviewed in electronic medical record.  No pertanent information unless stated regarding to the chief complaint.   Review of Systems:  No headache, visual changes, nausea, vomiting, diarrhea, constipation, dizziness, abdominal pain, skin rash, fevers, chills, night sweats, weight loss, swollen lymph nodes, body aches, joint swelling, chest pain, shortness of breath, mood changes. POSITIVE muscle aches  Objective  Blood pressure 124/82, pulse 88, height 5\' 10"  (1.778 m), weight 207 lb (93.9 kg), SpO2 98 %.   General: No apparent distress alert and oriented x3 mood and affect normal, dressed appropriately.  HEENT: Pupils equal, extraocular movements intact  Respiratory: Patient's speak in full sentences and does not appear short of breath  Cardiovascular: No lower extremity edema, non tender, no erythema  Gait normal with good balance and coordination.  MSK:  Non tender with full range of motion and good stability and symmetric strength and tone of shoulders, elbows, wrist, hip, knee and ankles bilaterally.  Back exam does have significant tightness noted.  Seems to be more in the parascapular region as well as in the lumbar spine right greater than left.  More tenderness over the right sacroiliac joint.  Patient does have tightness with FABER test.  Negative straight leg test for severe tightness of the hamstrings bilaterally.  Limited range  of motion in certain angles.  Osteopathic findings T8 extended rotated and side bent right L1 flexed rotated and side bent right L5 flexed rotated and side bent left Sacrum left on left    Impression and Recommendations:     The above documentation has been reviewed and is accurate and complete Lyndal Pulley, DO

## 2021-07-11 NOTE — Assessment & Plan Note (Signed)

## 2021-07-11 NOTE — Assessment & Plan Note (Signed)
Patient has had this pain for quite some time.  Does seem to be more multifactorial.  Discussed which activities to do which wants to avoid.  Increase activity slowly.  Discussed icing regimen and home exercises.  Increase activity.  Patient work with Product/process development scientist to learn more stretches for the hip flexors and the hip abductor strengthening.  Follow-up with me again in 6 to 8 weeks.

## 2021-07-19 ENCOUNTER — Other Ambulatory Visit: Payer: Self-pay | Admitting: *Deleted

## 2021-07-19 DIAGNOSIS — C3402 Malignant neoplasm of left main bronchus: Secondary | ICD-10-CM

## 2021-07-28 ENCOUNTER — Other Ambulatory Visit: Payer: Self-pay

## 2021-07-28 ENCOUNTER — Encounter: Payer: Self-pay | Admitting: Internal Medicine

## 2021-07-28 ENCOUNTER — Inpatient Hospital Stay: Payer: BC Managed Care – PPO

## 2021-07-28 ENCOUNTER — Inpatient Hospital Stay: Payer: BC Managed Care – PPO | Attending: Internal Medicine

## 2021-07-28 ENCOUNTER — Inpatient Hospital Stay (HOSPITAL_BASED_OUTPATIENT_CLINIC_OR_DEPARTMENT_OTHER): Payer: BC Managed Care – PPO | Admitting: Internal Medicine

## 2021-07-28 DIAGNOSIS — Z5111 Encounter for antineoplastic chemotherapy: Secondary | ICD-10-CM | POA: Insufficient documentation

## 2021-07-28 DIAGNOSIS — M7989 Other specified soft tissue disorders: Secondary | ICD-10-CM | POA: Insufficient documentation

## 2021-07-28 DIAGNOSIS — Z806 Family history of leukemia: Secondary | ICD-10-CM | POA: Diagnosis not present

## 2021-07-28 DIAGNOSIS — Z8679 Personal history of other diseases of the circulatory system: Secondary | ICD-10-CM | POA: Insufficient documentation

## 2021-07-28 DIAGNOSIS — C3402 Malignant neoplasm of left main bronchus: Secondary | ICD-10-CM | POA: Diagnosis not present

## 2021-07-28 LAB — COMPREHENSIVE METABOLIC PANEL
ALT: 20 U/L (ref 0–44)
AST: 22 U/L (ref 15–41)
Albumin: 4 g/dL (ref 3.5–5.0)
Alkaline Phosphatase: 44 U/L (ref 38–126)
Anion gap: 10 (ref 5–15)
BUN: 24 mg/dL — ABNORMAL HIGH (ref 6–20)
CO2: 26 mmol/L (ref 22–32)
Calcium: 8.9 mg/dL (ref 8.9–10.3)
Chloride: 99 mmol/L (ref 98–111)
Creatinine, Ser: 1.34 mg/dL — ABNORMAL HIGH (ref 0.61–1.24)
GFR, Estimated: >60 mL/min (ref >60)
Glucose, Bld: 135 mg/dL — ABNORMAL HIGH (ref 70–99)
Potassium: 3.9 mmol/L (ref 3.5–5.1)
Sodium: 135 mmol/L (ref 135–145)
Total Bilirubin: 0.7 mg/dL (ref 0.3–1.2)
Total Protein: 7.1 g/dL (ref 6.5–8.1)

## 2021-07-28 LAB — CBC WITH DIFFERENTIAL/PLATELET
Abs Immature Granulocytes: 0.02 10*3/uL (ref 0.00–0.07)
Basophils Absolute: 0.1 10*3/uL (ref 0.0–0.1)
Basophils Relative: 1 %
Eosinophils Absolute: 0.3 10*3/uL (ref 0.0–0.5)
Eosinophils Relative: 5 %
HCT: 42.6 % (ref 39.0–52.0)
Hemoglobin: 14.7 g/dL (ref 13.0–17.0)
Immature Granulocytes: 0 %
Lymphocytes Relative: 35 %
Lymphs Abs: 2 10*3/uL (ref 0.7–4.0)
MCH: 31.5 pg (ref 26.0–34.0)
MCHC: 34.5 g/dL (ref 30.0–36.0)
MCV: 91.4 fL (ref 80.0–100.0)
Monocytes Absolute: 0.5 10*3/uL (ref 0.1–1.0)
Monocytes Relative: 8 %
Neutro Abs: 3 10*3/uL (ref 1.7–7.7)
Neutrophils Relative %: 51 %
Platelets: 144 10*3/uL — ABNORMAL LOW (ref 150–400)
RBC: 4.66 MIL/uL (ref 4.22–5.81)
RDW: 12.6 % (ref 11.5–15.5)
WBC: 5.9 10*3/uL (ref 4.0–10.5)
nRBC: 0 % (ref 0.0–0.2)

## 2021-07-28 MED ORDER — HEPARIN SOD (PORK) LOCK FLUSH 100 UNIT/ML IV SOLN
500.0000 [IU] | Freq: Once | INTRAVENOUS | Status: AC | PRN
Start: 2021-07-28 — End: 2021-07-28
  Administered 2021-07-28: 500 [IU]
  Filled 2021-07-28: qty 5

## 2021-07-28 MED ORDER — CYANOCOBALAMIN 1000 MCG/ML IJ SOLN
1000.0000 ug | Freq: Once | INTRAMUSCULAR | Status: AC
  Administered 2021-07-28: 1000 ug via INTRAMUSCULAR
  Filled 2021-07-28: qty 1

## 2021-07-28 MED ORDER — PROCHLORPERAZINE MALEATE 10 MG PO TABS
10.0000 mg | ORAL_TABLET | Freq: Once | ORAL | Status: AC
  Administered 2021-07-28: 10 mg via ORAL
  Filled 2021-07-28: qty 1

## 2021-07-28 MED ORDER — SODIUM CHLORIDE 0.9 % IV SOLN
Freq: Once | INTRAVENOUS | Status: AC
  Filled 2021-07-28: qty 250

## 2021-07-28 MED ORDER — SODIUM CHLORIDE 0.9 % IV SOLN
500.0000 mg/m2 | Freq: Once | INTRAVENOUS | Status: AC
  Administered 2021-07-28: 1100 mg via INTRAVENOUS
  Filled 2021-07-28: qty 40

## 2021-07-28 MED ORDER — DEXAMETHASONE SODIUM PHOSPHATE 10 MG/ML IJ SOLN
6.0000 mg | Freq: Once | INTRAMUSCULAR | Status: AC
  Administered 2021-07-28: 6 mg via INTRAVENOUS
  Filled 2021-07-28: qty 1

## 2021-07-28 MED ORDER — HEPARIN SOD (PORK) LOCK FLUSH 100 UNIT/ML IV SOLN
INTRAVENOUS | Status: AC
  Filled 2021-07-28: qty 5

## 2021-07-28 NOTE — Assessment & Plan Note (Addendum)
#   Adenocarcinoma the lung; stage IV;JAN 10th, 2022-  CT scan chest/a/p-shows no evidence of metastatic disease-stable.  #Last maintenance chemotherapy April 2022.  Proceed with Alimta chemotherapy. Labs today reviewed;  acceptable for treatment today; B12 every third cycle cycle.  Order restaging CAT scans.  # Swelling in the hands/feet-grade 1 asymptomatic.? Alimta; monitor-STABLE  #Thoracic aneurysm-approximately 4.5 cm in size; on surveillance-stable.  Awaiting repeat CT scan.  Recommend follow-up with thoracic surgery in [place].  # DISPOSITION:  # Alimta today # Follow up in 3 weeks MD-labs- cbc/cmp; alimta; CT CAP prior- Dr.B

## 2021-07-28 NOTE — Progress Notes (Signed)
[facility] OFFICE PROGRESS NOTE  Patient Care Team: [name], MD as PCP - General (Family Medicine) [name], MD as Consulting Physician (Internal Medicine)  Cancer Staging No matching staging information was found for the patient.   Oncology History Overview Note  # 2012- METASTATIC [place] CA of LEFT LUNG [acinar pattern] s/p MED LN Bx [NEG- EGFR/K-ras/ALK mutation];[name]; [name]; CT JULY  2016- NED; JAN 2018- NED;   # AUG 2020-clinical trial closed; continue maintenance Alimta  # Thoracic aortic aneurysm-4.0 cm in 2016; currently 4.6 cm.  s/p referral to Dr. Faith Rogue.; MRI in 6 months planned.  ----------------------------------------------------------    DIAGNOSIS: [name]  STAGE: 4       ;GOALS: Palliative  CURRENT/MOST RECENT THERAPY '[ ]'  Alimta maintenance    Cancer of hilus of left lung ([place])  07/20/2016 Initial Diagnosis   Cancer of hilus of left lung ([place])   12/11/2019 -  Chemotherapy    Patient is on Treatment Plan: LUNG PEMETREXED (ALIMTA) Q21D           INTERVAL HISTORY:  Mitchell Mosley 60 y.o.  male pleasant patient above history of metastatic adenocarcinoma the lung-NED currently on maintenance Alimta is here for follow-up.  Patient had multiple engagements/travel in the last 4 months.  He states he started back on his chemotherapy.  Patient denies any worsening shortness of breath or cough.  Denies any nausea vomiting.  Review of Systems  Constitutional:  Negative for chills, diaphoresis, fever, malaise/fatigue and weight loss.  HENT:  Negative for nosebleeds and sore throat.   Eyes:  Negative for double vision.  Respiratory:  Negative for hemoptysis, shortness of breath and wheezing.   Cardiovascular:  Positive for leg swelling. Negative for chest pain, palpitations and orthopnea.   Gastrointestinal:  Negative for abdominal pain, blood in stool, constipation, diarrhea, heartburn, melena, nausea and vomiting.  Genitourinary:  Negative for dysuria, frequency and urgency.  Musculoskeletal:  Positive for joint pain.  Skin: Negative.  Negative for itching and rash.  Neurological:  Negative for dizziness, tingling, focal weakness, weakness and headaches.  Endo/Heme/Allergies:  Does not bruise/bleed easily.  Psychiatric/Behavioral:  Negative for depression. The patient is not nervous/anxious and does not have insomnia.      PAST MEDICAL HISTORY :  Past Medical History:  Diagnosis Date  . Allergy   . Lung cancer ([place])   . Mild valvular heart disease Nov. 2014   per 2 D Echocardiogram done for persistent leg edema, monitored by cardiology  . Peripheral edema   . Thoracic aortic aneurysm (TAA) ([place])     PAST SURGICAL HISTORY :   Past Surgical History:  Procedure Laterality Date  . ELBOW SURGERY Right 2011  . Left anterior thoracotomy with biopsy  March 2012  . PORTACATH PLACEMENT  March 2012  . TONSILLECTOMY  as child    FAMILY HISTORY :   Family History  Problem Relation Age of Onset  . Leukemia Father   . Diabetes Mother     SOCIAL HISTORY:   Social History   Tobacco Use  . Smoking status: Never  . Smokeless tobacco: Never  Substance Use Topics  . Alcohol use: Yes    Alcohol/week: 0.0 standard drinks    Comment: "social drinker"   . Drug use: No    ALLERGIES:  is allergic to penicillins.  MEDICATIONS:  Current Outpatient Medications  Medication Sig  Dispense Refill  . folic acid (FOLVITE) 1 MG tablet TAKE 1 TABLET BY MOUTH EVERY DAY 90 tablet 3  . furosemide (LASIX) 20 MG tablet TAKE 1 TABLET (20 MG TOTAL) BY MOUTH DAILY AS NEEDED FOR SWELLING 90 tablet 1  . meloxicam (MOBIC) 7.5 MG tablet Take 7.5 mg by mouth daily.     . Multiple Vitamin (MULTIVITAMIN) capsule Take 1 capsule by mouth daily. Reported on 02/24/2016    . NON FORMULARY Relief  Factor with vitamin D and Turmeric    . Omega-3 Fatty Acids (FISH OIL) 1000 MG CAPS Take 1 capsule by mouth daily. Reported on 02/24/2016    . senna (SENOKOT) 8.6 MG tablet Take 1 tablet by mouth daily. Take as needed around time of chemotherapy    . sildenafil (VIAGRA) 100 MG tablet TAKE 1 TABLET BY MOUTH EVERY DAY AS NEEDED FOR ERECTILE DYSFUNCTION 20 tablet 11  . spironolactone (ALDACTONE) 25 MG tablet TAKE 1 TABLET BY MOUTH EVERY DAY 90 tablet 3  . tadalafil (CIALIS) 20 MG tablet Take one tablet every 2 days as needed 50 tablet 5  . vitamin B-12 (CYANOCOBALAMIN) 1000 MCG tablet Take 1,000 mcg by mouth daily.    Wilfrid Lund & Ritonavir 20 x 150 MG & 10 x 100MG TBPK TAKE 2 NIRMATRELVIR TABLETS AND 1 RITONAVIR TABLET TWICE A DAY FOR 5 DAYS. (Patient not taking: No sig reported) 30 each 0   No current facility-administered medications for this visit.   Facility-Administered Medications Ordered in Other Visits  Medication Dose Route Frequency Provider Last Rate Last Admin  . heparin lock flush 100 unit/mL  500 Units Intravenous Once Charlaine Dalton R, [name], MD   10 mL at 05/27/15 1543  . sodium chloride 0.9 % injection 10 mL  10 mL Intracatheter PRN [name], MD   10 mL at 06/17/15 1510  . sodium chloride 0.9 % injection 10 mL  10 mL Intravenous PRN Choksi, Delorise Shiner, MD   10 mL at 07/08/15 1340  . sodium chloride flush (NS) 0.9 % injection 10 mL  10 mL Intravenous PRN Verlon Au, NP   10 mL at 09/30/20 1412    PHYSICAL EXAMINATION: ECOG PERFORMANCE STATUS: 0 - Asymptomatic  BP 130/83   Pulse 74   Temp (!) 96.2 F (35.7 C)   Resp 20   Wt 206 lb (93.4 kg)   SpO2 100%   BMI 29.56 kg/m   Filed Weights   07/28/21 1344  Weight: 206 lb (93.4 kg)    Physical Exam HENT:     Head: Normocephalic and atraumatic.     Mouth/Throat:     Pharynx: No oropharyngeal exudate.  Eyes:     Pupils:  Pupils are equal, round, and reactive to light.  Cardiovascular:     Rate and Rhythm: Normal rate and regular rhythm.  Pulmonary:     Effort: No respiratory distress.     Breath sounds: No wheezing.  Abdominal:     General: Bowel sounds are normal. There is no distension.     Palpations: Abdomen is soft. There is no mass.     Tenderness: no abdominal tenderness There is no guarding or rebound.  Musculoskeletal:        General: No tenderness. Normal range of motion.     Cervical back: Normal range of motion and neck supple.     Comments: Grade  1 swelling in the legs bilaterally.  Skin:    General: Skin is warm.  Neurological:     Mental Status: He is alert and oriented to person, place, and time.  Psychiatric:        Mood and Affect: Affect normal.     LABORATORY DATA:  I have reviewed the data as listed    Component Value Date/Time   NA 135 07/28/2021 1323   NA 137 06/19/2021 1121   NA 138 12/10/2014 1341   K 3.9 07/28/2021 1323   K 3.6 12/10/2014 1341   CL 99 07/28/2021 1323   CL 102 12/10/2014 1341   CO2 26 07/28/2021 1323   CO2 30 12/10/2014 1341   GLUCOSE 135 (H) 07/28/2021 1323   GLUCOSE 160 (H) 12/10/2014 1341   BUN 24 (H) 07/28/2021 1323   BUN 18 06/19/2021 1121   BUN 21 (H) 12/10/2014 1341   CREATININE 1.34 (H) 07/28/2021 1323   CREATININE 1.10 03/25/2015 1453   CALCIUM 8.9 07/28/2021 1323   CALCIUM 8.4 (L) 12/10/2014 1341   PROT 7.1 07/28/2021 1323   PROT 7.3 06/19/2021 1121   PROT 6.6 12/10/2014 1341   ALBUMIN 4.0 07/28/2021 1323   ALBUMIN 4.5 06/19/2021 1121   ALBUMIN 3.4 12/10/2014 1341   AST 22 07/28/2021 1323   AST 17 12/10/2014 1341   ALT 20 07/28/2021 1323   ALT 23 12/10/2014 1341   ALKPHOS 44 07/28/2021 1323   ALKPHOS 50 12/10/2014 1341   BILITOT 0.7 07/28/2021 1323   BILITOT 0.2 06/19/2021 1121   BILITOT 0.5 12/10/2014 1341   GFRNONAA >60 07/28/2021 1323   GFRNONAA >60 03/25/2015 1453   GFRAA >60 08/19/2020 1324   GFRAA >60 03/25/2015  1453    No results found for: SPEP, UPEP  Lab Results  Component Value Date   WBC 5.9 07/28/2021   NEUTROABS 3.0 07/28/2021   HGB 14.7 07/28/2021   HCT 42.6 07/28/2021   MCV 91.4 07/28/2021   PLT 144 (L) 07/28/2021      Chemistry      Component Value Date/Time   NA 135 07/28/2021 1323   NA 137 06/19/2021 1121   NA 138 12/10/2014 1341   K 3.9 07/28/2021 1323   K 3.6 12/10/2014 1341   CL 99 07/28/2021 1323   CL 102 12/10/2014 1341   CO2 26 07/28/2021 1323   CO2 30 12/10/2014 1341   BUN 24 (H) 07/28/2021 1323   BUN 18 06/19/2021 1121   BUN 21 (H) 12/10/2014 1341   CREATININE 1.34 (H) 07/28/2021 1323   CREATININE 1.10 03/25/2015 1453      Component Value Date/Time   CALCIUM 8.9 07/28/2021 1323   CALCIUM 8.4 (L) 12/10/2014 1341   ALKPHOS 44 07/28/2021 1323   ALKPHOS 50 12/10/2014 1341   AST 22 07/28/2021 1323   AST 17 12/10/2014 1341   ALT 20 07/28/2021 1323   ALT 23 12/10/2014 1341   BILITOT 0.7 07/28/2021 1323   BILITOT 0.2 06/19/2021 1121   BILITOT 0.5 12/10/2014 1341       RADIOGRAPHIC STUDIES: I have personally reviewed the radiological images as listed and agreed with the findings in the report. No results found.   ASSESSMENT & PLAN:  Cancer of hilus of left lung ([place]) # Adenocarcinoma the lung; stage IV;JAN 10th, 2022-  CT scan chest/a/p-shows no evidence of metastatic disease-stable.  #Last maintenance chemotherapy April 2022.  Proceed with Alimta chemotherapy. Labs today reviewed;  acceptable for treatment today; B12 every third cycle  cycle.  Order restaging CAT scans.  # Swelling in the hands/feet-grade 1 asymptomatic.? Alimta; monitor-STABLE  #Thoracic aneurysm-approximately 4.5 cm in size; on surveillance-stable.  Awaiting repeat CT scan.  Recommend follow-up with thoracic surgery in [place].  # DISPOSITION:  # Alimta today # Follow up in 3 weeks MD-labs- cbc/cmp; alimta; CT CAP prior- Dr.B    Orders Placed This Encounter  Procedures  .  CT CHEST ABDOMEN PELVIS W CONTRAST    Standing Status:   Future    Standing Expiration Date:   07/28/2022    Order Specific Question:   Preferred imaging location?    Answer:   [facility]    Order Specific Question:   Radiology Contrast Protocol - do NOT remove file path    Answer:   [email or web address]   All questions were answered. The patient knows to call the clinic with any problems, questions or concerns.      [name], MD 07/30/2021 6:15 PM

## 2021-07-28 NOTE — Patient Instructions (Signed)
[facility]  Discharge Instructions: Thank you for choosing [facility] to provide your oncology and hematology care.  If you have a lab appointment with the [facility], please go directly to the [facility] and check in at the registration area.  Wear comfortable clothing and clothing appropriate for easy access to any Portacath or PICC line.   We strive to give you quality time with your provider. You may need to reschedule your appointment if you arrive late (15 or more minutes).  Arriving late affects you and other patients whose appointments are after yours.  Also, if you miss three or more appointments without notifying the office, you may be dismissed from the clinic at the provider's discretion.      For prescription refill requests, have your pharmacy contact our office and allow 72 hours for refills to be completed.    Today you received the following chemotherapy and/or immunotherapy agents Alimta    To help prevent nausea and vomiting after your treatment, we encourage you to take your nausea medication as directed.  BELOW ARE SYMPTOMS THAT SHOULD BE REPORTED IMMEDIATELY: *FEVER GREATER THAN 100.4 F (38 C) OR HIGHER *CHILLS OR SWEATING *NAUSEA AND VOMITING THAT IS NOT CONTROLLED WITH YOUR NAUSEA MEDICATION *UNUSUAL SHORTNESS OF BREATH *UNUSUAL BRUISING OR BLEEDING *URINARY PROBLEMS (pain or burning when urinating, or frequent urination) *BOWEL PROBLEMS (unusual diarrhea, constipation, pain near the anus) TENDERNESS IN MOUTH AND THROAT WITH OR WITHOUT PRESENCE OF ULCERS (sore throat, sores in mouth, or a toothache) UNUSUAL RASH, SWELLING OR PAIN  UNUSUAL VAGINAL DISCHARGE OR ITCHING   Items with * indicate a potential emergency and should be followed up as soon as possible or go to the Emergency Department if any problems should occur.  Please show the CHEMOTHERAPY ALERT CARD or IMMUNOTHERAPY ALERT CARD at check-in to the  Emergency Department and triage nurse.  Should you have questions after your visit or need to cancel or reschedule your appointment, please contact [facility]  [phone] and follow the prompts.  Office hours are 8:00 a.m. to 4:30 p.m. Monday - Friday. Please note that voicemails left after 4:00 p.m. may not be returned until the following business day.  We are closed weekends and major holidays. You have access to a nurse at all times for urgent questions. Please call the main number to the clinic [phone] and follow the prompts.  For any non-urgent questions, you may also contact your provider using MyChart. We now offer e-Visits for anyone 59 and older to request care online for non-urgent symptoms. For details visit [email or web address].   Also download the MyChart app! Go to the app store, search "MyChart", open the app, select [facility], and log in with your MyChart username and password.  Due to Covid, a mask is required upon entering the hospital/clinic. If you do not have a mask, one will be given to you upon arrival. For doctor visits, patients may have 1 support person aged 67 or older with them. For treatment visits, patients cannot have anyone with them due to current Covid guidelines and our immunocompromised population.

## 2021-07-30 ENCOUNTER — Encounter: Payer: Self-pay | Admitting: Internal Medicine

## 2021-07-31 ENCOUNTER — Ambulatory Visit (INDEPENDENT_AMBULATORY_CARE_PROVIDER_SITE_OTHER): Payer: BC Managed Care – PPO | Admitting: Family Medicine

## 2021-07-31 ENCOUNTER — Ambulatory Visit
Admission: RE | Admit: 2021-07-31 | Discharge: 2021-07-31 | Disposition: A | Discharge status: Home / Self Care | Payer: BC Managed Care – PPO | Source: Ambulatory Visit | Attending: Family Medicine | Admitting: Family Medicine

## 2021-07-31 ENCOUNTER — Encounter: Payer: Self-pay | Admitting: Family Medicine

## 2021-07-31 ENCOUNTER — Ambulatory Visit
Admission: RE | Admit: 2021-07-31 | Discharge: 2021-07-31 | Disposition: A | Discharge status: Home / Self Care | Payer: BC Managed Care – PPO | Attending: Family Medicine | Admitting: Family Medicine

## 2021-07-31 ENCOUNTER — Other Ambulatory Visit: Payer: Self-pay

## 2021-07-31 ENCOUNTER — Ambulatory Visit: Payer: Self-pay | Admitting: *Deleted

## 2021-07-31 VITALS — BP 117/71 | HR 73 | Temp 98.5°F | Resp 16 | Ht 70.5 in | Wt 209.0 lb

## 2021-07-31 DIAGNOSIS — C3402 Malignant neoplasm of left main bronchus: Secondary | ICD-10-CM

## 2021-07-31 DIAGNOSIS — M25552 Pain in left hip: Secondary | ICD-10-CM

## 2021-07-31 DIAGNOSIS — M1611 Unilateral primary osteoarthritis, right hip: Secondary | ICD-10-CM | POA: Diagnosis not present

## 2021-07-31 MED ORDER — HYDROCODONE-ACETAMINOPHEN 5-325 MG PO TABS: 1.0000 | ORAL_TABLET | Freq: Four times a day (QID) | ORAL | 0 refills | Status: DC | PRN

## 2021-07-31 MED ORDER — PREDNISONE 10 MG (21) PO TBPK: ORAL_TABLET | ORAL | 0 refills | Status: DC

## 2021-07-31 NOTE — Telephone Encounter (Signed)
Reason for Disposition  [1] SEVERE pain (e.g., excruciating, unable to do any normal activities) AND [2] not improved after 2 hours of pain medicine  Answer Assessment - Initial Assessment Questions 1. ONSET: "When did the pain start?"      Last night before bed 2. LOCATION: "Where is the pain located?"      Left leg- outer side upper thigh 3. PAIN: "How bad is the pain?"    (Scale 1-10; or mild, moderate, severe)   -  MILD (1-3): doesn't interfere with normal activities    -  MODERATE (4-7): interferes with normal activities (e.g., work or school) or awakens from sleep, limping    -  SEVERE (8-10): excruciating pain, unable to do any normal activities, unable to walk     Moderate/severe 4. WORK OR EXERCISE: "Has there been any recent work or exercise that involved this part of the body?"      no 5. CAUSE: "What do you think is causing the leg pain?"     unsure 6. OTHER SYMPTOMS: "Do you have any other symptoms?" (e.g., chest pain, back pain, breathing difficulty, swelling, rash, fever, numbness, weakness)     no 7. PREGNANCY: "Is there any chance you are pregnant?" "When was your last menstrual period?"     N/a  Protocols used: Leg Pain-A-AH

## 2021-07-31 NOTE — Telephone Encounter (Signed)
Patient is calling with severe leg pain- upper thigh. Patient states he can hardly walk on his leg- call to office- appointment scheduled.

## 2021-07-31 NOTE — Progress Notes (Signed)
Established patient visit   Patient: Mitchell Mosley   DOB: 08/21/1961   60 y.o. Male  MRN: [id number] Visit Date: 07/31/2021  Today's healthcare provider: Wilhemena Durie, MD   Chief Complaint  Patient presents with   Leg Pain   Subjective  -------------------------------------------------------------------------------------------------------------------- Leg Pain  There was no injury mechanism. The pain is present in the left leg. The quality of the pain is described as aching and shooting. The pain is at a severity of 6/10. The pain is moderate (can be severe). The pain has been Constant since onset. Associated symptoms include an inability to bear weight. Pertinent negatives include no numbness or tingling. The symptoms are aggravated by movement and weight bearing. He has tried nothing for the symptoms.       Medications: Outpatient Medications Prior to Visit  Medication Sig   folic acid (FOLVITE) 1 MG tablet TAKE 1 TABLET BY MOUTH EVERY DAY   furosemide (LASIX) 20 MG tablet TAKE 1 TABLET (20 MG TOTAL) BY MOUTH DAILY AS NEEDED FOR SWELLING   meloxicam (MOBIC) 7.5 MG tablet Take 7.5 mg by mouth daily.    Multiple Vitamin (MULTIVITAMIN) capsule Take 1 capsule by mouth daily. Reported on 02/24/2016   Nirmatrelvir & Ritonavir 20 x 150 MG & 10 x 100MG  TBPK TAKE 2 NIRMATRELVIR TABLETS AND 1 RITONAVIR TABLET TWICE A DAY FOR 5 DAYS. (Patient not taking: No sig reported)   NON FORMULARY Relief Factor with vitamin D and Turmeric   Omega-3 Fatty Acids (FISH OIL) 1000 MG CAPS Take 1 capsule by mouth daily. Reported on 02/24/2016   senna (SENOKOT) 8.6 MG tablet Take 1 tablet by mouth daily. Take as needed around time of chemotherapy   sildenafil (VIAGRA) 100 MG tablet TAKE 1 TABLET BY MOUTH EVERY DAY AS NEEDED FOR ERECTILE DYSFUNCTION   spironolactone (ALDACTONE) 25 MG tablet TAKE 1 TABLET BY MOUTH EVERY DAY   tadalafil (CIALIS) 20 MG tablet Take one tablet every 2 days as needed    vitamin B-12 (CYANOCOBALAMIN) 1000 MCG tablet Take 1,000 mcg by mouth daily.   Facility-Administered Medications Prior to Visit  Medication Dose Route Frequency Provider   heparin lock flush 100 unit/mL  500 Units Intravenous Once Charlaine Dalton R, MD   sodium chloride 0.9 % injection 10 mL  10 mL Intracatheter PRN [name], MD   sodium chloride 0.9 % injection 10 mL  10 mL Intracatheter PRN [name], MD   sodium chloride 0.9 % injection 10 mL  10 mL Intravenous PRN Choksi, Janak, MD   sodium chloride flush (NS) 0.9 % injection 10 mL  10 mL Intravenous PRN Verlon Au, NP    Review of Systems  Neurological:  Negative for tingling and numbness.      Objective  -------------------------------------------------------------------------------------------------------------------- BP 117/71   Pulse 73   Temp 98.5 F (36.9 C)   Resp 16   Ht 5' 10.5" (1.791 m)   Wt 209 lb (94.8 kg)   BMI 29.56 kg/m  Physical Exam Vitals reviewed.  Constitutional:      Appearance: Normal appearance. He is normal weight.  HENT:     Right Ear: Tympanic membrane normal.     Left Ear: Tympanic membrane normal.     Nose: Nose normal.     Mouth/Throat:     Mouth: Mucous membranes are moist.  Cardiovascular:     Rate and Rhythm: Normal rate and regular rhythm.     Pulses: Normal pulses.  Heart sounds: Normal heart sounds.  Pulmonary:     Effort: Pulmonary effort is normal.     Breath sounds: Normal breath sounds.  Abdominal:     General: Bowel sounds are normal.     Palpations: Abdomen is soft.  Musculoskeletal:        General: Tenderness present. No swelling or deformity.     Cervical back: Normal range of motion and neck supple.     Comments: Trace edema He has some tenderness on the left greater trochanter.  Here for his normal and he has some mild tenderness around the left SI joint  Skin:    General: Skin is warm.  Neurological:     Mental Status: He is alert.   Psychiatric:        Mood and Affect: Mood normal.        Behavior: Behavior normal.        Thought Content: Thought content normal.        Judgment: Judgment normal.      No results found for any visits on 07/31/21.  Assessment & Plan  ---------------------------------------------------------------------------------------------------------------------- 1. Left hip pain The acuteness and severity of the pain imaging necessary.  May need MRI or bone scan depending on how he responds to the treatment. - predniSONE (STERAPRED UNI-PAK 21 TAB) 10 MG (21) TBPK tablet; Taper as directed.  Dispense: 21 tablet; Refill: 0 - DG Hip Unilat W OR W/O Pelvis 2-3 Views Left; Future - HYDROcodone-acetaminophen (NORCO) 5-325 MG tablet; Take 1 tablet by mouth every 6 (six) hours as needed for moderate pain.  Dispense: 30 tablet; Refill: 0 - Ambulatory referral to Orthopedic Surgery  2. Cancer of hilus of left lung ([place]) Followed by Dr. Jacinto Reap from oncology   No follow-ups on file.      I, Wilhemena Durie, MD, have reviewed all documentation for this visit. The documentation on 08/04/21 for the exam, diagnosis, procedures, and orders are all accurate and complete.    Sheriff Rodenberg Cranford Mon, MD  [facility] [phone] (phone) [phone] (fax)  [place]

## 2021-08-02 ENCOUNTER — Telehealth: Payer: Self-pay

## 2021-08-02 NOTE — Telephone Encounter (Signed)
Copied from [place] [id number]. Topic: General - Other >> Aug 02, 2021  1:33 PM Pawlus, Brayton Layman A wrote: Reason for CRM: Pt had some follow up questions regarding his imaging results done on 8/29, pt requested a call back.

## 2021-08-03 NOTE — Telephone Encounter (Signed)
LMOVM for pt to return call 

## 2021-08-09 DIAGNOSIS — M7072 Other bursitis of hip, left hip: Secondary | ICD-10-CM | POA: Diagnosis not present

## 2021-08-09 NOTE — Telephone Encounter (Signed)
LMOVM for pt to return call 

## 2021-08-11 NOTE — Telephone Encounter (Signed)
Unable to contact the patient. Letter mailed.

## 2021-08-14 ENCOUNTER — Other Ambulatory Visit: Payer: Self-pay

## 2021-08-14 ENCOUNTER — Ambulatory Visit
Admission: RE | Admit: 2021-08-14 | Discharge: 2021-08-14 | Disposition: A | Discharge status: Home / Self Care | Payer: BC Managed Care – PPO | Source: Ambulatory Visit | Attending: Internal Medicine | Admitting: Internal Medicine

## 2021-08-14 DIAGNOSIS — I712 Thoracic aortic aneurysm, without rupture: Secondary | ICD-10-CM | POA: Diagnosis not present

## 2021-08-14 DIAGNOSIS — I2510 Atherosclerotic heart disease of native coronary artery without angina pectoris: Secondary | ICD-10-CM | POA: Diagnosis not present

## 2021-08-14 DIAGNOSIS — C3402 Malignant neoplasm of left main bronchus: Secondary | ICD-10-CM | POA: Diagnosis not present

## 2021-08-14 DIAGNOSIS — I700 Atherosclerosis of aorta: Secondary | ICD-10-CM | POA: Diagnosis not present

## 2021-08-14 DIAGNOSIS — C3490 Malignant neoplasm of unspecified part of unspecified bronchus or lung: Secondary | ICD-10-CM | POA: Diagnosis not present

## 2021-08-14 MED ORDER — IOHEXOL 350 MG/ML SOLN
75.0000 mL | Freq: Once | INTRAVENOUS | Status: AC | PRN
  Administered 2021-08-14: 75 mL via INTRAVENOUS

## 2021-08-15 ENCOUNTER — Ambulatory Visit: Admission: RE | Admit: 2021-08-15 | Payer: BC Managed Care – PPO | Source: Ambulatory Visit

## 2021-08-18 ENCOUNTER — Inpatient Hospital Stay (HOSPITAL_BASED_OUTPATIENT_CLINIC_OR_DEPARTMENT_OTHER): Payer: BC Managed Care – PPO | Admitting: Internal Medicine

## 2021-08-18 ENCOUNTER — Inpatient Hospital Stay: Payer: BC Managed Care – PPO

## 2021-08-18 ENCOUNTER — Inpatient Hospital Stay: Payer: BC Managed Care – PPO | Attending: Internal Medicine

## 2021-08-18 DIAGNOSIS — Z5111 Encounter for antineoplastic chemotherapy: Secondary | ICD-10-CM | POA: Diagnosis not present

## 2021-08-18 DIAGNOSIS — M7989 Other specified soft tissue disorders: Secondary | ICD-10-CM | POA: Insufficient documentation

## 2021-08-18 DIAGNOSIS — C3402 Malignant neoplasm of left main bronchus: Secondary | ICD-10-CM

## 2021-08-18 DIAGNOSIS — Z8679 Personal history of other diseases of the circulatory system: Secondary | ICD-10-CM | POA: Insufficient documentation

## 2021-08-18 DIAGNOSIS — I712 Thoracic aortic aneurysm, without rupture: Secondary | ICD-10-CM | POA: Insufficient documentation

## 2021-08-18 DIAGNOSIS — Z806 Family history of leukemia: Secondary | ICD-10-CM | POA: Insufficient documentation

## 2021-08-18 LAB — COMPREHENSIVE METABOLIC PANEL
ALT: 21 U/L (ref 0–44)
AST: 20 U/L (ref 15–41)
Albumin: 4.3 g/dL (ref 3.5–5.0)
Alkaline Phosphatase: 42 U/L (ref 38–126)
Anion gap: 9 (ref 5–15)
BUN: 18 mg/dL (ref 6–20)
CO2: 26 mmol/L (ref 22–32)
Calcium: 9.1 mg/dL (ref 8.9–10.3)
Chloride: 97 mmol/L — ABNORMAL LOW (ref 98–111)
Creatinine, Ser: 1.25 mg/dL — ABNORMAL HIGH (ref 0.61–1.24)
GFR, Estimated: >60 mL/min (ref >60)
Glucose, Bld: 135 mg/dL — ABNORMAL HIGH (ref 70–99)
Potassium: 4.2 mmol/L (ref 3.5–5.1)
Sodium: 132 mmol/L — ABNORMAL LOW (ref 135–145)
Total Bilirubin: 1.3 mg/dL — ABNORMAL HIGH (ref 0.3–1.2)
Total Protein: 7.4 g/dL (ref 6.5–8.1)

## 2021-08-18 LAB — CBC WITH DIFFERENTIAL/PLATELET
Abs Immature Granulocytes: 0.02 10*3/uL (ref 0.00–0.07)
Basophils Absolute: 0.1 10*3/uL (ref 0.0–0.1)
Basophils Relative: 1 %
Eosinophils Absolute: 0.2 10*3/uL (ref 0.0–0.5)
Eosinophils Relative: 3 %
HCT: 42.2 % (ref 39.0–52.0)
Hemoglobin: 14.4 g/dL (ref 13.0–17.0)
Immature Granulocytes: 0 %
Lymphocytes Relative: 31 %
Lymphs Abs: 1.8 10*3/uL (ref 0.7–4.0)
MCH: 31.2 pg (ref 26.0–34.0)
MCHC: 34.1 g/dL (ref 30.0–36.0)
MCV: 91.5 fL (ref 80.0–100.0)
Monocytes Absolute: 0.6 10*3/uL (ref 0.1–1.0)
Monocytes Relative: 10 %
Neutro Abs: 3.2 10*3/uL (ref 1.7–7.7)
Neutrophils Relative %: 55 %
Platelets: 159 10*3/uL (ref 150–400)
RBC: 4.61 MIL/uL (ref 4.22–5.81)
RDW: 13 % (ref 11.5–15.5)
WBC: 5.8 10*3/uL (ref 4.0–10.5)
nRBC: 0 % (ref 0.0–0.2)

## 2021-08-18 MED ORDER — SODIUM CHLORIDE 0.9% FLUSH
10.0000 mL | INTRAVENOUS | Status: DC | PRN
  Filled 2021-08-18: qty 10

## 2021-08-18 MED ORDER — CYANOCOBALAMIN 1000 MCG/ML IJ SOLN
1000.0000 ug | Freq: Once | INTRAMUSCULAR | Status: DC
  Filled 2021-08-18: qty 1

## 2021-08-18 MED ORDER — HEPARIN SOD (PORK) LOCK FLUSH 100 UNIT/ML IV SOLN
500.0000 [IU] | Freq: Once | INTRAVENOUS | Status: AC | PRN
  Filled 2021-08-18: qty 5

## 2021-08-18 MED ORDER — PROCHLORPERAZINE MALEATE 10 MG PO TABS
10.0000 mg | ORAL_TABLET | Freq: Once | ORAL | Status: AC
  Administered 2021-08-18: 10 mg via ORAL
  Filled 2021-08-18: qty 1

## 2021-08-18 MED ORDER — DEXAMETHASONE SODIUM PHOSPHATE 10 MG/ML IJ SOLN
6.0000 mg | Freq: Once | INTRAMUSCULAR | Status: AC
  Administered 2021-08-18: 6 mg via INTRAVENOUS
  Filled 2021-08-18: qty 1

## 2021-08-18 MED ORDER — SODIUM CHLORIDE 0.9 % IV SOLN
Freq: Once | INTRAVENOUS | Status: AC
  Filled 2021-08-18: qty 250

## 2021-08-18 MED ORDER — SODIUM CHLORIDE 0.9 % IV SOLN
500.0000 mg/m2 | Freq: Once | INTRAVENOUS | Status: AC
  Administered 2021-08-18: 1100 mg via INTRAVENOUS
  Filled 2021-08-18: qty 40

## 2021-08-18 MED ORDER — HEPARIN SOD (PORK) LOCK FLUSH 100 UNIT/ML IV SOLN
INTRAVENOUS | Status: AC
  Administered 2021-08-18: 500 [IU]
  Filled 2021-08-18: qty 5

## 2021-08-18 NOTE — Assessment & Plan Note (Addendum)
#   Adenocarcinoma the lung; stage IV; SEP 13th, 2022-  CT scan chest/a/p-shows no evidence of metastatic disease- STABLE No evidence of recurrent or metastatic carcinoma within the chest, abdomen, or pelvis.  #  Proceed with Alimta chemotherapy. Labs today reviewed;  acceptable for treatment today; B12 every third cycle cycle.    Stable 4.5 cm ascending thoracic aortic aneurysm. Recommend semi-annual imaging followup by CTA or MRA.   # Swelling in the hands/feet-grade 1 asymptomatic.? Alimta; monitor-STABLE  #Thoracic aneurysm-approximately 4.5 cm in size; on surveillance-stable. SEP 12th CT scan-STABLE.  Recommend follow-up with thoracic surgery in [place].  # DISPOSITION:  # Alimta today # Follow up in 3 weeks MD-labs- cbc/cmp; alimta- Dr.B  # I reviewed the blood work- with the patient in detail; also reviewed the imaging independently [as summarized above]; and with the patient in detail.

## 2021-08-18 NOTE — Progress Notes (Signed)
[facility] OFFICE PROGRESS NOTE  Patient Care Team: [name], MD as PCP - General (Family Medicine) [name], MD as Consulting Physician (Internal Medicine)  Cancer Staging No matching staging information was found for the patient.   Oncology History Overview Note  # 2012- METASTATIC [place]ENO CA of LEFT LUNG [acinar pattern] s/p MED LN Bx [NEG- EGFR/K-ras/ALK mutation];[name]; [name]; CT JULY  2016- NED; JAN 2018- NED;   # AUG 2020-clinical trial closed; continue maintenance Alimta  # Thoracic aortic aneurysm-4.0 cm in 2016; currently 4.6 cm.  s/p referral to Dr. Faith Rogue.; MRI in 6 months planned.  ----------------------------------------------------------    DIAGNOSIS: [name]  STAGE: 4       ;GOALS: Palliative  CURRENT/MOST RECENT THERAPY '[ ]'  Alimta maintenance    Cancer of hilus of left lung ([place])  07/20/2016 Initial Diagnosis   Cancer of hilus of left lung ([place])   12/11/2019 -  Chemotherapy    Patient is on Treatment Plan: LUNG PEMETREXED (ALIMTA) Q21D           INTERVAL HISTORY:  Mitchell Mosley 60 y.o.  male pleasant patient above history of metastatic adenocarcinoma the lung-NED currently on maintenance Alimta is here for follow-up/review results of the CT scan  Patient has loss some weight/about 5 pounds attributable to dietary discretion.  No headaches.  No nausea vomiting.  No fatigue.  Continues with physically active. Review of Systems  Constitutional:  Negative for chills, diaphoresis, fever, malaise/fatigue and weight loss.  HENT:  Negative for nosebleeds and sore throat.   Eyes:  Negative for double vision.  Respiratory:  Negative for hemoptysis, shortness of breath and wheezing.   Cardiovascular:  Positive for leg swelling. Negative for chest pain, palpitations and orthopnea.   Gastrointestinal:  Negative for abdominal pain, blood in stool, constipation, diarrhea, heartburn, melena, nausea and vomiting.  Genitourinary:  Negative for dysuria, frequency and urgency.  Musculoskeletal:  Positive for joint pain.  Skin: Negative.  Negative for itching and rash.  Neurological:  Negative for dizziness, tingling, focal weakness, weakness and headaches.  Endo/Heme/Allergies:  Does not bruise/bleed easily.  Psychiatric/Behavioral:  Negative for depression. The patient is not nervous/anxious and does not have insomnia.      PAST MEDICAL HISTORY :  Past Medical History:  Diagnosis Date  . Allergy   . Lung cancer ([place])   . Mild valvular heart disease Nov. 2014   per 2 D Echocardiogram done for persistent leg edema, monitored by cardiology  . Peripheral edema   . Thoracic aortic aneurysm (TAA) ([place])     PAST SURGICAL HISTORY :   Past Surgical History:  Procedure Laterality Date  . ELBOW SURGERY Right 2011  . Left anterior thoracotomy with biopsy  March 2012  . PORTACATH PLACEMENT  March 2012  . TONSILLECTOMY  as child    FAMILY HISTORY :   Family History  Problem Relation Age of Onset  . Leukemia Father   . Diabetes Mother     SOCIAL HISTORY:   Social History   Tobacco Use  . Smoking status: Never  . Smokeless tobacco: Never  Substance Use Topics  . Alcohol use: Yes    Alcohol/week: 0.0 standard drinks    Comment: "social drinker"   . Drug use: No    ALLERGIES:  is allergic to penicillins.  MEDICATIONS:  Current Outpatient Medications  Medication Sig Dispense Refill  .  folic acid (FOLVITE) 1 MG tablet TAKE 1 TABLET BY MOUTH EVERY DAY 90 tablet 3  . furosemide (LASIX) 20 MG tablet TAKE 1 TABLET (20 MG TOTAL) BY MOUTH DAILY AS NEEDED FOR SWELLING 90 tablet 1  . HYDROcodone-acetaminophen (NORCO) 5-325 MG tablet Take 1 tablet by mouth every 6 (six) hours as needed for moderate pain. 30 tablet 0  . meloxicam (MOBIC) 7.5 MG tablet Take 7.5 mg by mouth  daily.     . Multiple Vitamin (MULTIVITAMIN) capsule Take 1 capsule by mouth daily. Reported on 02/24/2016    . NON FORMULARY Relief Factor with vitamin D and Turmeric    . Omega-3 Fatty Acids (FISH OIL) 1000 MG CAPS Take 1 capsule by mouth daily. Reported on 02/24/2016    . predniSONE (STERAPRED UNI-PAK 21 TAB) 10 MG (21) TBPK tablet Taper as directed. 21 tablet 0  . senna (SENOKOT) 8.6 MG tablet Take 1 tablet by mouth daily. Take as needed around time of chemotherapy    . sildenafil (VIAGRA) 100 MG tablet TAKE 1 TABLET BY MOUTH EVERY DAY AS NEEDED FOR ERECTILE DYSFUNCTION 20 tablet 11  . spironolactone (ALDACTONE) 25 MG tablet TAKE 1 TABLET BY MOUTH EVERY DAY 90 tablet 3  . tadalafil (CIALIS) 20 MG tablet Take one tablet every 2 days as needed 50 tablet 5  . vitamin B-12 (CYANOCOBALAMIN) 1000 MCG tablet Take 1,000 mcg by mouth daily.    Wilfrid Lund & Ritonavir 20 x 150 MG & 10 x 100MG TBPK TAKE 2 NIRMATRELVIR TABLETS AND 1 RITONAVIR TABLET TWICE A DAY FOR 5 DAYS. (Patient not taking: No sig reported) 30 each 0   No current facility-administered medications for this visit.   Facility-Administered Medications Ordered in Other Visits  Medication Dose Route Frequency Provider Last Rate Last Admin  . heparin lock flush 100 unit/mL  500 Units Intravenous Once Charlaine Dalton R, [name], MD   10 mL at 05/27/15 1543  . sodium chloride 0.9 % injection 10 mL  10 mL Intracatheter PRN [name], MD   10 mL at 06/17/15 1510  . sodium chloride 0.9 % injection 10 mL  10 mL Intravenous PRN Choksi, Delorise Shiner, MD   10 mL at 07/08/15 1340  . sodium chloride flush (NS) 0.9 % injection 10 mL  10 mL Intravenous PRN Verlon Au, NP   10 mL at 09/30/20 1412    PHYSICAL EXAMINATION: ECOG PERFORMANCE STATUS: 0 - Asymptomatic  BP 134/86   Pulse 83   Temp 98.7 F (37.1 C)   Resp 20   Wt 204 lb 6.4 oz (92.7 kg)   SpO2  100%   BMI 28.91 kg/m   Filed Weights   08/18/21 1427  Weight: 204 lb 6.4 oz (92.7 kg)    Physical Exam HENT:     Head: Normocephalic and atraumatic.     Mouth/Throat:     Pharynx: No oropharyngeal exudate.  Eyes:     Pupils: Pupils are equal, round, and reactive to light.  Cardiovascular:     Rate and Rhythm: Normal rate and regular rhythm.  Pulmonary:     Effort: No respiratory distress.     Breath sounds: No wheezing.  Abdominal:     General: Bowel sounds are normal. There is no distension.     Palpations: Abdomen is soft. There is no mass.     Tenderness: There is no  abdominal tenderness. There is no guarding or rebound.  Musculoskeletal:        General: No tenderness. Normal range of motion.     Cervical back: Normal range of motion and neck supple.     Comments: Grade 1 swelling in the legs bilaterally.  Skin:    General: Skin is warm.  Neurological:     Mental Status: He is alert and oriented to person, place, and time.  Psychiatric:        Mood and Affect: Affect normal.     LABORATORY DATA:  I have reviewed the data as listed    Component Value Date/Time   NA 132 (L) 08/18/2021 1417   NA 137 06/19/2021 1121   NA 138 12/10/2014 1341   K 4.2 08/18/2021 1417   K 3.6 12/10/2014 1341   CL 97 (L) 08/18/2021 1417   CL 102 12/10/2014 1341   CO2 26 08/18/2021 1417   CO2 30 12/10/2014 1341   GLUCOSE 135 (H) 08/18/2021 1417   GLUCOSE 160 (H) 12/10/2014 1341   BUN 18 08/18/2021 1417   BUN 18 06/19/2021 1121   BUN 21 (H) 12/10/2014 1341   CREATININE 1.25 (H) 08/18/2021 1417   CREATININE 1.10 03/25/2015 1453   CALCIUM 9.1 08/18/2021 1417   CALCIUM 8.4 (L) 12/10/2014 1341   PROT 7.4 08/18/2021 1417   PROT 7.3 06/19/2021 1121   PROT 6.6 12/10/2014 1341   ALBUMIN 4.3 08/18/2021 1417   ALBUMIN 4.5 06/19/2021 1121   ALBUMIN 3.4 12/10/2014 1341   AST 20 08/18/2021 1417   AST 17 12/10/2014 1341   ALT 21 08/18/2021 1417   ALT 23 12/10/2014 1341   ALKPHOS 42  08/18/2021 1417   ALKPHOS 50 12/10/2014 1341   BILITOT 1.3 (H) 08/18/2021 1417   BILITOT 0.2 06/19/2021 1121   BILITOT 0.5 12/10/2014 1341   GFRNONAA >60 08/18/2021 1417   GFRNONAA >60 03/25/2015 1453   GFRAA >60 08/19/2020 1324   GFRAA >60 03/25/2015 1453    No results found for: SPEP, UPEP  Lab Results  Component Value Date   WBC 5.8 08/18/2021   NEUTROABS 3.2 08/18/2021   HGB 14.4 08/18/2021   HCT 42.2 08/18/2021   MCV 91.5 08/18/2021   PLT 159 08/18/2021      Chemistry      Component Value Date/Time   NA 132 (L) 08/18/2021 1417   NA 137 06/19/2021 1121   NA 138 12/10/2014 1341   K 4.2 08/18/2021 1417   K 3.6 12/10/2014 1341   CL 97 (L) 08/18/2021 1417   CL 102 12/10/2014 1341   CO2 26 08/18/2021 1417   CO2 30 12/10/2014 1341   BUN 18 08/18/2021 1417   BUN 18 06/19/2021 1121   BUN 21 (H) 12/10/2014 1341   CREATININE 1.25 (H) 08/18/2021 1417   CREATININE 1.10 03/25/2015 1453      Component Value Date/Time   CALCIUM 9.1 08/18/2021 1417   CALCIUM 8.4 (L) 12/10/2014 1341   ALKPHOS 42 08/18/2021 1417   ALKPHOS 50 12/10/2014 1341   AST 20 08/18/2021 1417   AST 17 12/10/2014 1341   ALT 21 08/18/2021 1417   ALT 23 12/10/2014 1341   BILITOT 1.3 (H) 08/18/2021 1417   BILITOT 0.2 06/19/2021 1121   BILITOT 0.5 12/10/2014 1341       RADIOGRAPHIC STUDIES: I have personally reviewed the radiological images as listed and agreed with the findings in the report. No results found.   ASSESSMENT & PLAN:  Cancer  of hilus of left lung ([place]) # Adenocarcinoma the lung; stage IV; SEP 13th, 2022-  CT scan chest/a/p-shows no evidence of metastatic disease- STABLE No evidence of recurrent or metastatic carcinoma within the chest, abdomen, or pelvis.  #  Proceed with Alimta chemotherapy. Labs today reviewed;  acceptable for treatment today; B12 every third cycle cycle.     Stable 4.5 cm ascending thoracic aortic aneurysm. Recommend semi-annual imaging followup by CTA or MRA.    # Swelling in the hands/feet-grade 1 asymptomatic.? Alimta; monitor-STABLE  #Thoracic aneurysm-approximately 4.5 cm in size; on surveillance-stable. SEP 12th CT scan-STABLE.  Recommend follow-up with thoracic surgery in [place].  # DISPOSITION:  # Alimta today # Follow up in 3 weeks MD-labs- cbc/cmp; alimta- Dr.B  # I reviewed the blood work- with the patient in detail; also reviewed the imaging independently [as summarized above]; and with the patient in detail.     No orders of the defined types were placed in this encounter.  All questions were answered. The patient knows to call the clinic with any problems, questions or concerns.      [name], MD 08/19/2021 12:12 AM

## 2021-08-18 NOTE — Progress Notes (Signed)
Pt tolerated all infusions well today with no problems or complaints.  B12 injection not administered today because it was given last cycle per pharmacy.  Pt left infusion suite stable and ambulatory.

## 2021-08-18 NOTE — Patient Instructions (Signed)
[facility]  Discharge Instructions: Thank you for choosing [facility] to provide your oncology and hematology care.  If you have a lab appointment with the [facility], please go directly to the [facility] and check in at the registration area.  Wear comfortable clothing and clothing appropriate for easy access to any Portacath or PICC line.   We strive to give you quality time with your provider. You may need to reschedule your appointment if you arrive late (15 or more minutes).  Arriving late affects you and other patients whose appointments are after yours.  Also, if you miss three or more appointments without notifying the office, you may be dismissed from the clinic at the provider's discretion.      For prescription refill requests, have your pharmacy contact our office and allow 72 hours for refills to be completed.    Today you received the following chemotherapy and/or immunotherapy agents alimta      To help prevent nausea and vomiting after your treatment, we encourage you to take your nausea medication as directed.  BELOW ARE SYMPTOMS THAT SHOULD BE REPORTED IMMEDIATELY: *FEVER GREATER THAN 100.4 F (38 C) OR HIGHER *CHILLS OR SWEATING *NAUSEA AND VOMITING THAT IS NOT CONTROLLED WITH YOUR NAUSEA MEDICATION *UNUSUAL SHORTNESS OF BREATH *UNUSUAL BRUISING OR BLEEDING *URINARY PROBLEMS (pain or burning when urinating, or frequent urination) *BOWEL PROBLEMS (unusual diarrhea, constipation, pain near the anus) TENDERNESS IN MOUTH AND THROAT WITH OR WITHOUT PRESENCE OF ULCERS (sore throat, sores in mouth, or a toothache) UNUSUAL RASH, SWELLING OR PAIN  UNUSUAL VAGINAL DISCHARGE OR ITCHING   Items with * indicate a potential emergency and should be followed up as soon as possible or go to the Emergency Department if any problems should occur.  Please show the CHEMOTHERAPY ALERT CARD or IMMUNOTHERAPY ALERT CARD at check-in to  the Emergency Department and triage nurse.  Should you have questions after your visit or need to cancel or reschedule your appointment, please contact [facility]  [phone] and follow the prompts.  Office hours are 8:00 a.m. to 4:30 p.m. Monday - Friday. Please note that voicemails left after 4:00 p.m. may not be returned until the following business day.  We are closed weekends and major holidays. You have access to a nurse at all times for urgent questions. Please call the main number to the clinic [phone] and follow the prompts.  For any non-urgent questions, you may also contact your provider using MyChart. We now offer e-Visits for anyone 72 and older to request care online for non-urgent symptoms. For details visit [email or web address].   Also download the MyChart app! Go to the app store, search "MyChart", open the app, select [facility], and log in with your MyChart username and password.  Due to Covid, a mask is required upon entering the hospital/clinic. If you do not have a mask, one will be given to you upon arrival. For doctor visits, patients may have 1 support person aged 78 or older with them. For treatment visits, patients cannot have anyone with them due to current Covid guidelines and our immunocompromised population.   Pemetrexed injection What is this medication? PEMETREXED (PEM e TREX ed) is a chemotherapy drug used to treat lung cancers like non-small cell lung cancer and mesothelioma. It may also be used to treat other cancers. This medicine may be used for other purposes; ask your health care provider or pharmacist if you have questions.  COMMON BRAND NAME(S): Alimta, PEMFEXY What should I tell my care team before I take this medication? They need to know if you have any of these conditions: infection (especially a virus infection such as chickenpox, cold sores, or herpes) kidney disease low blood counts, like low white  cell, platelet, or red cell counts lung or breathing disease, like asthma radiation therapy an unusual or allergic reaction to pemetrexed, other medicines, foods, dyes, or preservative pregnant or trying to get pregnant breast-feeding How should I use this medication? This drug is given as an infusion into a vein. It is administered in a hospital or clinic by a specially trained health care professional. Talk to your pediatrician regarding the use of this medicine in children. Special care may be needed. Overdosage: If you think you have taken too much of this medicine contact a poison control center or emergency room at once. NOTE: This medicine is only for you. Do not share this medicine with others. What if I miss a dose? It is important not to miss your dose. Call your doctor or health care professional if you are unable to keep an appointment. What may interact with this medication? This medicine may interact with the following medications: Ibuprofen This list may not describe all possible interactions. Give your health care provider a list of all the medicines, herbs, non-prescription drugs, or dietary supplements you use. Also tell them if you smoke, drink alcohol, or use illegal drugs. Some items may interact with your medicine. What should I watch for while using this medication? Visit your doctor for checks on your progress. This drug may make you feel generally unwell. This is not uncommon, as chemotherapy can affect healthy cells as well as cancer cells. Report any side effects. Continue your course of treatment even though you feel ill unless your doctor tells you to stop. In some cases, you may be given additional medicines to help with side effects. Follow all directions for their use. Call your doctor or health care professional for advice if you get a fever, chills or sore throat, or other symptoms of a cold or flu. Do not treat yourself. This drug decreases your body's ability  to fight infections. Try to avoid being around people who are sick. This medicine may increase your risk to bruise or bleed. Call your doctor or health care professional if you notice any unusual bleeding. Be careful brushing and flossing your teeth or using a toothpick because you may get an infection or bleed more easily. If you have any dental work done, tell your dentist you are receiving this medicine. Avoid taking products that contain aspirin, acetaminophen, ibuprofen, naproxen, or ketoprofen unless instructed by your doctor. These medicines may hide a fever. Call your doctor or health care professional if you get diarrhea or mouth sores. Do not treat yourself. To protect your kidneys, drink water or other fluids as directed while you are taking this medicine. Do not become pregnant while taking this medicine or for 6 months after stopping it. Women should inform their doctor if they wish to become pregnant or think they might be pregnant. Men should not father a child while taking this medicine and for 3 months after stopping it. This may interfere with the ability to father a child. You should talk to your doctor or health care professional if you are concerned about your fertility. There is a potential for serious side effects to an unborn child. Talk to your health care professional or pharmacist for  more information. Do not breast-feed an infant while taking this medicine or for 1 week after stopping it. What side effects may I notice from receiving this medication? Side effects that you should report to your doctor or health care professional as soon as possible: allergic reactions like skin rash, itching or hives, swelling of the face, lips, or tongue breathing problems redness, blistering, peeling or loosening of the skin, including inside the mouth signs and symptoms of bleeding such as bloody or black, tarry stools; red or dark-brown urine; spitting up blood or brown material that looks  like coffee grounds; red spots on the skin; unusual bruising or bleeding from the eye, gums, or nose signs and symptoms of infection like fever or chills; cough; sore throat; pain or trouble passing urine signs and symptoms of kidney injury like trouble passing urine or change in the amount of urine signs and symptoms of liver injury like dark yellow or brown urine; general ill feeling or flu-like symptoms; light-colored stools; loss of appetite; nausea; right upper belly pain; unusually weak or tired; yellowing of the eyes or skin Side effects that usually do not require medical attention (report to your doctor or health care professional if they continue or are bothersome): constipation mouth sores nausea, vomiting unusually weak or tired This list may not describe all possible side effects. Call your doctor for medical advice about side effects. You may report side effects to FDA at 1-800-FDA-1088. Where should I keep my medication? This drug is given in a hospital or clinic and will not be stored at home. NOTE: This sheet is a summary. It may not cover all possible information. If you have questions about this medicine, talk to your doctor, pharmacist, or health care provider.  2022 Elsevier/Gold Standard (2018-01-08 16:11:33)

## 2021-08-19 ENCOUNTER — Encounter: Payer: Self-pay | Admitting: Internal Medicine

## 2021-09-08 ENCOUNTER — Inpatient Hospital Stay (HOSPITAL_BASED_OUTPATIENT_CLINIC_OR_DEPARTMENT_OTHER): Payer: BC Managed Care – PPO | Admitting: Internal Medicine

## 2021-09-08 ENCOUNTER — Telehealth: Payer: Self-pay

## 2021-09-08 ENCOUNTER — Inpatient Hospital Stay: Payer: BC Managed Care – PPO | Attending: Internal Medicine

## 2021-09-08 ENCOUNTER — Encounter: Payer: Self-pay | Admitting: Internal Medicine

## 2021-09-08 ENCOUNTER — Inpatient Hospital Stay: Payer: BC Managed Care – PPO

## 2021-09-08 VITALS — BP 121/73 | HR 84 | Temp 97.8°F | Resp 20 | Wt 207.4 lb

## 2021-09-08 DIAGNOSIS — I712 Thoracic aortic aneurysm, without rupture, unspecified: Secondary | ICD-10-CM | POA: Diagnosis not present

## 2021-09-08 DIAGNOSIS — C3402 Malignant neoplasm of left main bronchus: Secondary | ICD-10-CM

## 2021-09-08 DIAGNOSIS — Z806 Family history of leukemia: Secondary | ICD-10-CM | POA: Insufficient documentation

## 2021-09-08 DIAGNOSIS — Z452 Encounter for adjustment and management of vascular access device: Secondary | ICD-10-CM | POA: Diagnosis not present

## 2021-09-08 DIAGNOSIS — Z8679 Personal history of other diseases of the circulatory system: Secondary | ICD-10-CM | POA: Diagnosis not present

## 2021-09-08 DIAGNOSIS — Z95828 Presence of other vascular implants and grafts: Secondary | ICD-10-CM | POA: Diagnosis not present

## 2021-09-08 DIAGNOSIS — M7072 Other bursitis of hip, left hip: Secondary | ICD-10-CM | POA: Diagnosis not present

## 2021-09-08 DIAGNOSIS — E538 Deficiency of other specified B group vitamins: Secondary | ICD-10-CM | POA: Diagnosis not present

## 2021-09-08 DIAGNOSIS — Z5111 Encounter for antineoplastic chemotherapy: Secondary | ICD-10-CM | POA: Diagnosis not present

## 2021-09-08 LAB — COMPREHENSIVE METABOLIC PANEL
ALT: 19 U/L (ref 0–44)
AST: 26 U/L (ref 15–41)
Albumin: 3.9 g/dL (ref 3.5–5.0)
Alkaline Phosphatase: 48 U/L (ref 38–126)
Anion gap: 8 (ref 5–15)
BUN: 18 mg/dL (ref 6–20)
CO2: 29 mmol/L (ref 22–32)
Calcium: 9.6 mg/dL (ref 8.9–10.3)
Chloride: 100 mmol/L (ref 98–111)
Creatinine, Ser: 1.25 mg/dL — ABNORMAL HIGH (ref 0.61–1.24)
GFR, Estimated: >60 mL/min (ref >60)
Glucose, Bld: 176 mg/dL — ABNORMAL HIGH (ref 70–99)
Potassium: 4.6 mmol/L (ref 3.5–5.1)
Sodium: 137 mmol/L (ref 135–145)
Total Bilirubin: 0.6 mg/dL (ref 0.3–1.2)
Total Protein: 7.3 g/dL (ref 6.5–8.1)

## 2021-09-08 LAB — CBC WITH DIFFERENTIAL/PLATELET
Abs Immature Granulocytes: 0.03 10*3/uL (ref 0.00–0.07)
Basophils Absolute: 0.1 10*3/uL (ref 0.0–0.1)
Basophils Relative: 1 %
Eosinophils Absolute: 0.1 10*3/uL (ref 0.0–0.5)
Eosinophils Relative: 2 %
HCT: 41 % (ref 39.0–52.0)
Hemoglobin: 13.7 g/dL (ref 13.0–17.0)
Immature Granulocytes: 1 %
Lymphocytes Relative: 31 %
Lymphs Abs: 1.7 10*3/uL (ref 0.7–4.0)
MCH: 31.3 pg (ref 26.0–34.0)
MCHC: 33.4 g/dL (ref 30.0–36.0)
MCV: 93.6 fL (ref 80.0–100.0)
Monocytes Absolute: 0.5 10*3/uL (ref 0.1–1.0)
Monocytes Relative: 9 %
Neutro Abs: 3 10*3/uL (ref 1.7–7.7)
Neutrophils Relative %: 56 %
Platelets: 188 10*3/uL (ref 150–400)
RBC: 4.38 MIL/uL (ref 4.22–5.81)
RDW: 13.1 % (ref 11.5–15.5)
WBC: 5.3 10*3/uL (ref 4.0–10.5)
nRBC: 0 % (ref 0.0–0.2)

## 2021-09-08 MED ORDER — CYANOCOBALAMIN 1000 MCG/ML IJ SOLN: 1000.0000 ug | Freq: Once | INTRAMUSCULAR | Status: DC

## 2021-09-08 MED ORDER — SODIUM CHLORIDE 0.9 % IV SOLN
500.0000 mg/m2 | Freq: Once | INTRAVENOUS | Status: AC
  Administered 2021-09-08: 1100 mg via INTRAVENOUS
  Filled 2021-09-08: qty 4

## 2021-09-08 MED ORDER — SODIUM CHLORIDE 0.9% FLUSH
10.0000 mL | Freq: Once | INTRAVENOUS | Status: DC
  Filled 2021-09-08: qty 10

## 2021-09-08 MED ORDER — DEXAMETHASONE SODIUM PHOSPHATE 10 MG/ML IJ SOLN
6.0000 mg | Freq: Once | INTRAMUSCULAR | Status: AC
  Administered 2021-09-08: 6 mg via INTRAVENOUS
  Filled 2021-09-08: qty 1

## 2021-09-08 MED ORDER — SODIUM CHLORIDE 0.9 % IV SOLN
Freq: Once | INTRAVENOUS | Status: AC
  Filled 2021-09-08: qty 250

## 2021-09-08 MED ORDER — PROCHLORPERAZINE MALEATE 10 MG PO TABS
10.0000 mg | ORAL_TABLET | Freq: Once | ORAL | Status: AC
  Administered 2021-09-08: 10 mg via ORAL
  Filled 2021-09-08: qty 1

## 2021-09-08 MED ORDER — HEPARIN SOD (PORK) LOCK FLUSH 100 UNIT/ML IV SOLN
500.0000 [IU] | Freq: Once | INTRAVENOUS | Status: AC
  Administered 2021-09-08: 500 [IU] via INTRAVENOUS
  Filled 2021-09-08: qty 5

## 2021-09-08 NOTE — Patient Instructions (Signed)
[facility]  Discharge Instructions: Thank you for choosing [facility] to provide your oncology and hematology care.  If you have a lab appointment with the Cancer Center, please go directly to the Cancer Center and check in at the registration area.  Wear comfortable clothing and clothing appropriate for easy access to any Portacath or PICC line.   We strive to give you quality time with your provider. You may need to reschedule your appointment if you arrive late (15 or more minutes).  Arriving late affects you and other patients whose appointments are after yours.  Also, if you miss three or more appointments without notifying the office, you may be dismissed from the clinic at the provider's discretion.      For prescription refill requests, have your pharmacy contact our office and allow 72 hours for refills to be completed.      To help prevent nausea and vomiting after your treatment, we encourage you to take your nausea medication as directed.  BELOW ARE SYMPTOMS THAT SHOULD BE REPORTED IMMEDIATELY: *FEVER GREATER THAN 100.4 F (38 C) OR HIGHER *CHILLS OR SWEATING *NAUSEA AND VOMITING THAT IS NOT CONTROLLED WITH YOUR NAUSEA MEDICATION *UNUSUAL SHORTNESS OF BREATH *UNUSUAL BRUISING OR BLEEDING *URINARY PROBLEMS (pain or burning when urinating, or frequent urination) *BOWEL PROBLEMS (unusual diarrhea, constipation, pain near the anus) TENDERNESS IN MOUTH AND THROAT WITH OR WITHOUT PRESENCE OF ULCERS (sore throat, sores in mouth, or a toothache) UNUSUAL RASH, SWELLING OR PAIN  UNUSUAL VAGINAL DISCHARGE OR ITCHING   Items with * indicate a potential emergency and should be followed up as soon as possible or go to the Emergency Department if any problems should occur.  Please show the CHEMOTHERAPY ALERT CARD or IMMUNOTHERAPY ALERT CARD at check-in to the Emergency Department and triage nurse.  Should you have questions after your  visit or need to cancel or reschedule your appointment, please contact [facility]  [phone] and follow the prompts.  Office hours are 8:00 a.m. to 4:30 p.m. Monday - Friday. Please note that voicemails left after 4:00 p.m. may not be returned until the following business day.  We are closed weekends and major holidays. You have access to a nurse at all times for urgent questions. Please call the main number to the clinic [phone] and follow the prompts.  For any non-urgent questions, you may also contact your provider using MyChart. We now offer e-Visits for anyone 18 and older to request care online for non-urgent symptoms. For details visit [email or web address].   Also download the MyChart app! Go to the app store, search "MyChart", open the app, select , and log in with your MyChart username and password.  Due to Covid, a mask is required upon entering the hospital/clinic. If you do not have a mask, one will be given to you upon arrival. For doctor visits, patients may have 1 support person aged 18 or older with them. For treatment visits, patients cannot have anyone with them due to current Covid guidelines and our immunocompromised population.  

## 2021-09-08 NOTE — Assessment & Plan Note (Addendum)
#   Adenocarcinoma the lung; stage IV; SEP 13th, 2022-  CT scan chest/a/p-shows no evidence of metastatic disease- STABLE. SEP 2022- CT No evidence of recurrent or metastatic carcinoma within the chest, abdomen, or pelvis.  #  Proceed with Alimta chemotherapy. Labs today reviewed;  acceptable for treatment today; B12 every third cycle cycle.    # MSK- left hip bursitis s/p injection- Swelling in the hands/feet-grade 1 asymptomatic.? Alimta; monitor-STABLE.   #Thoracic aneurysm-approximately 4.5 cm in size; on surveillance-stable. SEP 12th CT scan-STABLE.  Recommend follow-up with thoracic surgery in [place].  # port malfunction: await port study; plan PIV chemo today.  # DISPOSITION:  # Alimta today # Follow up in 3 weeks MD-labs- cbc/cmp; alimta/ b12 Dr.B

## 2021-09-08 NOTE — Progress Notes (Signed)
[facility] OFFICE PROGRESS NOTE  Patient Care Team: [name], MD as PCP - General (Family Medicine) [name], MD as Consulting Physician (Internal Medicine)  Cancer Staging No matching staging information was found for the patient.   Oncology History Overview Note  # 2012- METASTATIC ADENO CA of LEFT LUNG [acinar pattern] s/p MED LN Bx [NEG- EGFR/K-ras/ALK mutation];[name]; [name]; CT JULY  2016- NED; JAN 2018- NED;   # AUG 2020-clinical trial closed; continue maintenance Alimta  # Thoracic aortic aneurysm-4.0 cm in 2016; currently 4.6 cm.  s/p referral to Dr. Faith Rogue.; MRI in 6 months planned.  ----------------------------------------------------------    DIAGNOSIS: [name]  STAGE: 4       ;GOALS: Palliative  CURRENT/MOST RECENT THERAPY _0  Alimta maintenance    Cancer of hilus of left lung ([facility])  07/20/2016 Initial Diagnosis   Cancer of hilus of left lung ([facility])   12/11/2019 -  Chemotherapy   Patient is on Treatment Plan : LUNG Pemetrexed (Alimta) q21d         INTERVAL HISTORY:  ELJAY LAVE 60 y.o.  male pleasant patient above history of metastatic adenocarcinoma the lung-NED currently on maintenance Alimta is here for follow-up.  Patient denies any nausea vomiting.  Appetite is good.  No headaches.  Noted to have left hip pain.  Diagnosed with bursitis s/p steroid injection.  He is on meloxicam.  No fatigue.  Continues with physically active.  Review of Systems  Constitutional:  Negative for chills, diaphoresis, fever, malaise/fatigue and weight loss.  HENT:  Negative for nosebleeds and sore throat.   Eyes:  Negative for double vision.  Respiratory:  Negative for hemoptysis, shortness of breath and wheezing.   Cardiovascular:  Positive for leg swelling. Negative for chest pain, palpitations and  orthopnea.  Gastrointestinal:  Negative for abdominal pain, blood in stool, constipation, diarrhea, heartburn, melena, nausea and vomiting.  Genitourinary:  Negative for dysuria, frequency and urgency.  Musculoskeletal:  Positive for joint pain.  Skin: Negative.  Negative for itching and rash.  Neurological:  Negative for dizziness, tingling, focal weakness, weakness and headaches.  Endo/Heme/Allergies:  Does not bruise/bleed easily.  Psychiatric/Behavioral:  Negative for depression. The patient is not nervous/anxious and does not have insomnia.      PAST MEDICAL HISTORY :  Past Medical History:  Diagnosis Date   Allergy    Lung cancer ([name])    Mild valvular heart disease Nov. 2014   per 2 D Echocardiogram done for persistent leg edema, monitored by cardiology   Peripheral edema    Thoracic aortic aneurysm (TAA)     PAST SURGICAL HISTORY :   Past Surgical History:  Procedure Laterality Date   ELBOW SURGERY Right 2011   Left anterior thoracotomy with biopsy  March 2012   PORTACATH PLACEMENT  March 2012   TONSILLECTOMY  as child    FAMILY HISTORY :   Family History  Problem Relation Age of Onset   Leukemia Father    Diabetes Mother     SOCIAL HISTORY:   Social History   Tobacco Use   Smoking status: Never   Smokeless tobacco: Never  Substance Use Topics   Alcohol use: Yes    Alcohol/week: 0.0 standard drinks    Comment: "social drinker"    Drug use: No    ALLERGIES:  is allergic to penicillins.  MEDICATIONS:  Current Outpatient Medications  Medication Sig Dispense Refill   folic acid (FOLVITE) 1 MG tablet TAKE 1 TABLET BY MOUTH EVERY DAY 90 tablet 3   furosemide (LASIX) 20 MG tablet TAKE 1 TABLET (20 MG TOTAL) BY MOUTH DAILY AS NEEDED FOR SWELLING 90 tablet 1   HYDROcodone-acetaminophen (NORCO) 5-325 MG tablet Take 1 tablet by mouth every 6 (six) hours as needed for moderate pain. 30 tablet 0   meloxicam (MOBIC) 15 MG tablet Take 15 mg by mouth daily.      Multiple Vitamin (MULTIVITAMIN) capsule Take 1 capsule by mouth daily. Reported on 02/24/2016     NON FORMULARY Relief Factor with vitamin D and Turmeric     Omega-3 Fatty Acids (FISH OIL) 1000 MG CAPS Take 1 capsule by mouth daily. Reported on 02/24/2016     predniSONE (STERAPRED UNI-PAK 21 TAB) 10 MG (21) TBPK tablet Taper as directed. 21 tablet 0   senna (SENOKOT) 8.6 MG tablet Take 1 tablet by mouth daily. Take as needed around time of chemotherapy     sildenafil (VIAGRA) 100 MG tablet TAKE 1 TABLET BY MOUTH EVERY DAY AS NEEDED FOR ERECTILE DYSFUNCTION 20 tablet 11   spironolactone (ALDACTONE) 25 MG tablet TAKE 1 TABLET BY MOUTH EVERY DAY 90 tablet 3   tadalafil (CIALIS) 20 MG tablet Take one tablet every 2 days as needed 50 tablet 5   vitamin B-12 (CYANOCOBALAMIN) 1000 MCG tablet Take 1,000 mcg by mouth daily.     meloxicam (MOBIC) 7.5 MG tablet Take 15 mg by mouth daily. (Patient not taking: Reported on 09/08/2021)     Nirmatrelvir & Ritonavir 20 x 150 MG & 10 x 100MG TBPK TAKE 2 NIRMATRELVIR TABLETS AND 1 RITONAVIR TABLET TWICE A DAY FOR 5 DAYS. (Patient not taking: No sig reported) 30 each 0   No current facility-administered medications for this visit.   Facility-Administered Medications Ordered in Other Visits  Medication Dose Route Frequency Provider Last Rate Last Admin   heparin lock flush 100 unit/mL  500 Units Intravenous Once Charlaine Dalton R, MD       sodium chloride 0.9 % injection 10 mL  10 mL Intracatheter PRN [name], MD   10 mL at 05/27/15 1543   sodium chloride 0.9 % injection 10 mL  10 mL Intracatheter PRN [name], MD   10 mL at 06/17/15 1510   sodium chloride 0.9 % injection 10 mL  10 mL Intravenous PRN [name], MD   10 mL at 07/08/15 1340   sodium chloride flush (NS) 0.9 % injection 10 mL  10 mL Intravenous PRN Verlon Au, NP   10 mL at 09/30/20 1412   sodium chloride flush (NS) 0.9 % injection 10 mL  10 mL Intravenous Once [name], MD        PHYSICAL EXAMINATION: ECOG PERFORMANCE STATUS: 0 - Asymptomatic  BP 121/73   Pulse 84   Temp 97.8 F (36.6 C)   Resp 20   Wt 207 lb 6.4 oz (94.1 kg)   SpO2 100%   BMI 29.34 kg/m   Filed Weights   09/08/21 1346  Weight: 207 lb 6.4 oz (94.1 kg)    Physical Exam HENT:     Head: Normocephalic and atraumatic.     Mouth/Throat:     Pharynx: No oropharyngeal exudate.  Eyes:     Pupils: Pupils are equal, round, and reactive to light.  Cardiovascular:     Rate and Rhythm: Normal rate and regular rhythm.  Pulmonary:  Effort: No respiratory distress.     Breath sounds: No wheezing.  Abdominal:     General: Bowel sounds are normal. There is no distension.     Palpations: Abdomen is soft. There is no mass.     Tenderness: There is no abdominal tenderness. There is no guarding or rebound.  Musculoskeletal:        General: No tenderness. Normal range of motion.     Cervical back: Normal range of motion and neck supple.     Comments: Grade 1 swelling in the legs bilaterally.  Skin:    General: Skin is warm.  Neurological:     Mental Status: He is alert and oriented to person, place, and time.  Psychiatric:        Mood and Affect: Affect normal.     LABORATORY DATA:  I have reviewed the data as listed    Component Value Date/Time   NA 137 09/08/2021 1322   NA 137 06/19/2021 1121   NA 138 12/10/2014 1341   K 4.6 09/08/2021 1322   K 3.6 12/10/2014 1341   CL 100 09/08/2021 1322   CL 102 12/10/2014 1341   CO2 29 09/08/2021 1322   CO2 30 12/10/2014 1341   GLUCOSE 176 (H) 09/08/2021 1322   GLUCOSE 160 (H) 12/10/2014 1341   BUN 18 09/08/2021 1322   BUN 18 06/19/2021 1121   BUN 21 (H) 12/10/2014 1341   CREATININE 1.25 (H) 09/08/2021 1322   CREATININE 1.10 03/25/2015 1453   CALCIUM 9.6 09/08/2021 1322   CALCIUM 8.4 (L) 12/10/2014 1341   PROT 7.3 09/08/2021 1322   PROT 7.3 06/19/2021 1121   PROT 6.6 12/10/2014 1341   ALBUMIN 3.9 09/08/2021 1322    ALBUMIN 4.5 06/19/2021 1121   ALBUMIN 3.4 12/10/2014 1341   AST 26 09/08/2021 1322   AST 17 12/10/2014 1341   ALT 19 09/08/2021 1322   ALT 23 12/10/2014 1341   ALKPHOS 48 09/08/2021 1322   ALKPHOS 50 12/10/2014 1341   BILITOT 0.6 09/08/2021 1322   BILITOT 0.2 06/19/2021 1121   BILITOT 0.5 12/10/2014 1341   GFRNONAA >60 09/08/2021 1322   GFRNONAA >60 03/25/2015 1453   GFRAA >60 08/19/2020 1324   GFRAA >60 03/25/2015 1453    No results found for: SPEP, UPEP  Lab Results  Component Value Date   WBC 5.3 09/08/2021   NEUTROABS 3.0 09/08/2021   HGB 13.7 09/08/2021   HCT 41.0 09/08/2021   MCV 93.6 09/08/2021   PLT 188 09/08/2021      Chemistry      Component Value Date/Time   NA 137 09/08/2021 1322   NA 137 06/19/2021 1121   NA 138 12/10/2014 1341   K 4.6 09/08/2021 1322   K 3.6 12/10/2014 1341   CL 100 09/08/2021 1322   CL 102 12/10/2014 1341   CO2 29 09/08/2021 1322   CO2 30 12/10/2014 1341   BUN 18 09/08/2021 1322   BUN 18 06/19/2021 1121   BUN 21 (H) 12/10/2014 1341   CREATININE 1.25 (H) 09/08/2021 1322   CREATININE 1.10 03/25/2015 1453      Component Value Date/Time   CALCIUM 9.6 09/08/2021 1322   CALCIUM 8.4 (L) 12/10/2014 1341   ALKPHOS 48 09/08/2021 1322   ALKPHOS 50 12/10/2014 1341   AST 26 09/08/2021 1322   AST 17 12/10/2014 1341   ALT 19 09/08/2021 1322   ALT 23 12/10/2014 1341   BILITOT 0.6 09/08/2021 1322   BILITOT 0.2 06/19/2021 1121  BILITOT 0.5 12/10/2014 1341       RADIOGRAPHIC STUDIES: I have personally reviewed the radiological images as listed and agreed with the findings in the report. No results found.   ASSESSMENT & PLAN:  Cancer of hilus of left lung ([place]) # Adenocarcinoma the lung; stage IV; SEP 13th, 2022-  CT scan chest/a/p-shows no evidence of metastatic disease- STABLE. SEP 2022- CT No evidence of recurrent or metastatic carcinoma within the chest, abdomen, or pelvis.  #  Proceed with Alimta chemotherapy. Labs today  reviewed;  acceptable for treatment today; B12 every third cycle cycle.    # MSK- left hip bursitis s/p injection- Swelling in the hands/feet-grade 1 asymptomatic.? Alimta; monitor-STABLE.   #Thoracic aneurysm-approximately 4.5 cm in size; on surveillance-stable. SEP 12th CT scan-STABLE.  Recommend follow-up with thoracic surgery in [place].  # port malfunction: await port study; plan PIV chemo today.  # DISPOSITION:  # Alimta today # Follow up in 3 weeks MD-labs- cbc/cmp; alimta- Dr.B     Orders Placed This Encounter  Procedures   DG Fluoro Guide CV Line Left    Standing Status:   Future    Standing Expiration Date:   09/08/2022    Order Specific Question:   Reason for Exam (SYMPTOM  OR DIAGNOSIS REQUIRED)    Answer:   no blood return from port a cath    Order Specific Question:   Preferred imaging location?    Answer:   [facility]   IR Fluoro Guide CV Line Left    Standing Status:   Future    Standing Expiration Date:   09/08/2022    Order Specific Question:   Reason for Exam (SYMPTOM  OR DIAGNOSIS REQUIRED)    Answer:   no blood return from port a cath    Order Specific Question:   Preferred Imaging Location?    Answer:   [facility]    All questions were answered. The patient knows to call the clinic with any problems, questions or concerns.      [name], MD 09/08/2021 4:20 PM

## 2021-09-08 NOTE — Telephone Encounter (Signed)
Called patient 09/08/2021 at 3:57 pm regarding scheduled procedure for port-a-catheter dye study in IR for port malfunction-scheduled 09/11/2021. Discussed: Arrival time of 11:35 to [facility] patient registration patient verbalized arrival time. Does not need to be NPO for procedure and ok to drive self home as will not receive sedation for procedure and patient verbalized understanding.   Questions answered.

## 2021-09-11 ENCOUNTER — Ambulatory Visit: Payer: BC Managed Care – PPO | Admitting: Family Medicine

## 2021-09-11 ENCOUNTER — Other Ambulatory Visit: Payer: Self-pay

## 2021-09-11 ENCOUNTER — Ambulatory Visit
Admission: RE | Admit: 2021-09-11 | Discharge: 2021-09-11 | Disposition: A | Discharge status: Home / Self Care | Payer: BC Managed Care – PPO | Source: Ambulatory Visit | Attending: Internal Medicine | Admitting: Internal Medicine

## 2021-09-11 DIAGNOSIS — Z85118 Personal history of other malignant neoplasm of bronchus and lung: Secondary | ICD-10-CM | POA: Diagnosis not present

## 2021-09-11 DIAGNOSIS — T82598A Other mechanical complication of other cardiac and vascular devices and implants, initial encounter: Secondary | ICD-10-CM | POA: Diagnosis not present

## 2021-09-11 DIAGNOSIS — Z79899 Other long term (current) drug therapy: Secondary | ICD-10-CM | POA: Insufficient documentation

## 2021-09-11 DIAGNOSIS — T82828A Fibrosis of vascular prosthetic devices, implants and grafts, initial encounter: Secondary | ICD-10-CM | POA: Diagnosis not present

## 2021-09-11 DIAGNOSIS — Z452 Encounter for adjustment and management of vascular access device: Secondary | ICD-10-CM | POA: Insufficient documentation

## 2021-09-11 DIAGNOSIS — Z880 Allergy status to penicillin: Secondary | ICD-10-CM | POA: Insufficient documentation

## 2021-09-11 DIAGNOSIS — Z95828 Presence of other vascular implants and grafts: Secondary | ICD-10-CM

## 2021-09-11 HISTORY — PX: IR CV LINE INJECTION: IMG2294

## 2021-09-11 MED ORDER — IOHEXOL 300 MG/ML  SOLN
5.0000 mL | Freq: Once | INTRAMUSCULAR | Status: AC | PRN
  Administered 2021-09-11: 5 mL
  Filled 2021-09-11: qty 5

## 2021-09-11 MED ORDER — HEPARIN SOD (PORK) LOCK FLUSH 100 UNIT/ML IV SOLN
INTRAVENOUS | Status: AC
  Filled 2021-09-11: qty 5

## 2021-09-11 MED ORDER — HEPARIN SOD (PORK) LOCK FLUSH 100 UNIT/ML IV SOLN
INTRAVENOUS | Status: DC | PRN
  Administered 2021-09-11: 500 [IU] via INTRAVENOUS

## 2021-09-11 NOTE — H&P (Signed)
Vascular and Interventional Radiology  PRE PROCEDURE H&P  Assessment  Plan:   [name] is a 60 y.o. year old male who will undergo Port Injection / Check in Interventional Radiology.  The procedure has been fully reviewed with the patient/patient's authorized representative. The risks, benefits and alternatives have been explained, and the patient/patient's authorized representative has consented to the procedure. -- The patient will accept blood products in an emergent situation. -- The patient does not have a Do Not Resuscitate order in effect.  HPI: [name] is a 60 y.o. year old male with PMHx significant for metastatic adenoCA of the left lung s/p L port catheter placement approx 10 yrs ago. Pt reports catheter malfunction with difficulty drawing back. Last evaluation on 03/2020 with fibrin sheath.  Informed consent was obtained, witnessed and placed in the patient's chart.  Allergies:  Allergies  Allergen Reactions   Penicillins     Reaction and severity unknown    Medications:  Current Outpatient Medications on File Prior to Encounter  Medication Sig Dispense Refill   folic acid (FOLVITE) 1 MG tablet TAKE 1 TABLET BY MOUTH EVERY DAY 90 tablet 3   furosemide (LASIX) 20 MG tablet TAKE 1 TABLET (20 MG TOTAL) BY MOUTH DAILY AS NEEDED FOR SWELLING 90 tablet 1   HYDROcodone-acetaminophen (NORCO) 5-325 MG tablet Take 1 tablet by mouth every 6 (six) hours as needed for moderate pain. 30 tablet 0   meloxicam (MOBIC) 15 MG tablet Take 15 mg by mouth daily.     meloxicam (MOBIC) 7.5 MG tablet Take 15 mg by mouth daily. (Patient not taking: Reported on 09/08/2021)     Multiple Vitamin (MULTIVITAMIN) capsule Take 1 capsule by mouth daily. Reported on 02/24/2016     Nirmatrelvir & Ritonavir 20 x 150 MG & 10 x 100MG  TBPK TAKE 2 NIRMATRELVIR TABLETS AND 1 RITONAVIR TABLET TWICE A DAY FOR 5 DAYS. (Patient not taking: No sig reported) 30 each 0   NON FORMULARY Relief Factor  with vitamin D and Turmeric     Omega-3 Fatty Acids (FISH OIL) 1000 MG CAPS Take 1 capsule by mouth daily. Reported on 02/24/2016     predniSONE (STERAPRED UNI-PAK 21 TAB) 10 MG (21) TBPK tablet Taper as directed. 21 tablet 0   senna (SENOKOT) 8.6 MG tablet Take 1 tablet by mouth daily. Take as needed around time of chemotherapy     sildenafil (VIAGRA) 100 MG tablet TAKE 1 TABLET BY MOUTH EVERY DAY AS NEEDED FOR ERECTILE DYSFUNCTION 20 tablet 11   spironolactone (ALDACTONE) 25 MG tablet TAKE 1 TABLET BY MOUTH EVERY DAY 90 tablet 3   tadalafil (CIALIS) 20 MG tablet Take one tablet every 2 days as needed 50 tablet 5   vitamin B-12 (CYANOCOBALAMIN) 1000 MCG tablet Take 1,000 mcg by mouth daily.     Current Facility-Administered Medications on File Prior to Encounter  Medication Dose Route Frequency Provider Last Rate Last Admin   heparin lock flush 100 unit/mL  500 Units Intravenous Once Charlaine Dalton R, MD       sodium chloride 0.9 % injection 10 mL  10 mL Intracatheter PRN [name], MD   10 mL at 05/27/15 1543   sodium chloride 0.9 % injection 10 mL  10 mL Intracatheter PRN [name], MD   10 mL at 06/17/15 1510   sodium chloride 0.9 % injection 10 mL  10 mL Intravenous PRN [name], MD   10 mL at 07/08/15 1340  sodium chloride flush (NS) 0.9 % injection 10 mL  10 mL Intravenous PRN Verlon Au, NP   10 mL at 09/30/20 1412    PSH:  Past Surgical History:  Procedure Laterality Date   ELBOW SURGERY Right 2011   Left anterior thoracotomy with biopsy  March 2012   [facility] PLACEMENT  March 2012   TONSILLECTOMY  as child    PMH:  Past Medical History:  Diagnosis Date   Allergy    Lung cancer ([place])    Mild valvular heart disease Nov. 2014   per 2 D Echocardiogram done for persistent leg edema, monitored by cardiology   Peripheral edema    Thoracic aortic aneurysm (TAA)     Brief Physical Examination: There were no vitals filed for this visit. General: WD,  WN male in NAD HEENT: Normocephalic, atraumatic Lungs: Respirations non-labored  ASA Grade: 3 Mallampati Class: 3   Michaelle Birks, MD Vascular and Interventional Radiology [facility] Radiology   Pager. [place]

## 2021-09-11 NOTE — Procedures (Signed)
Vascular and Interventional Radiology Procedure Note  Patient: Mitchell Mosley DOB: February 18, 1961 Medical Record Number: [id number] Note Date/Time: 09/11/21 1:04 PM   Performing Physician: Michaelle Birks, MD Assistant(s): None  Diagnosis:  Malfunctioning chest port  Procedure: PORT CHECK / INJECTION  Anesthesia: Local Anesthetic Complications: None Estimated Blood Loss:  0 mL Specimens:  None  Findings:  Injection of a left-sided venous port. Prominent fibrin sheath on DSA, present since 03/2020 comparison, and allows for flushing but not draw back.  Recommendations: Port stripping vs Revision, per Pt and Oncologist preference. If stripping, then to be performed at [facility]  See detailed procedure note with images in PACS. The patient tolerated the procedure well without incident or complication and was returned to Recovery in stable condition.    Michaelle Birks, MD Vascular and Interventional Radiology Specialists [facility] Radiology   Pager. [place]

## 2021-09-19 ENCOUNTER — Other Ambulatory Visit: Payer: Self-pay | Admitting: Internal Medicine

## 2021-09-22 ENCOUNTER — Telehealth: Payer: Self-pay | Admitting: *Deleted

## 2021-09-22 NOTE — Telephone Encounter (Signed)
Opened duplicate phone note in error

## 2021-09-22 NOTE — Telephone Encounter (Signed)
Patient informed that at the next visit, Dr. B will add TPA for his fibrin sheath in port. Pt stated that he would like to try the TPA first/front line prior to considering port removal. If he has to have the port removed he would prefer [facility] provider. Dr. [name] previously placed the port-but providers have now retired.

## 2021-09-29 ENCOUNTER — Inpatient Hospital Stay (HOSPITAL_BASED_OUTPATIENT_CLINIC_OR_DEPARTMENT_OTHER): Payer: BC Managed Care – PPO | Admitting: Internal Medicine

## 2021-09-29 ENCOUNTER — Other Ambulatory Visit: Payer: Self-pay

## 2021-09-29 ENCOUNTER — Inpatient Hospital Stay: Payer: BC Managed Care – PPO

## 2021-09-29 DIAGNOSIS — E538 Deficiency of other specified B group vitamins: Secondary | ICD-10-CM | POA: Diagnosis not present

## 2021-09-29 DIAGNOSIS — Z8679 Personal history of other diseases of the circulatory system: Secondary | ICD-10-CM | POA: Diagnosis not present

## 2021-09-29 DIAGNOSIS — Z5111 Encounter for antineoplastic chemotherapy: Secondary | ICD-10-CM | POA: Diagnosis not present

## 2021-09-29 DIAGNOSIS — C3402 Malignant neoplasm of left main bronchus: Secondary | ICD-10-CM | POA: Diagnosis not present

## 2021-09-29 DIAGNOSIS — Z806 Family history of leukemia: Secondary | ICD-10-CM | POA: Diagnosis not present

## 2021-09-29 DIAGNOSIS — I712 Thoracic aortic aneurysm, without rupture, unspecified: Secondary | ICD-10-CM | POA: Diagnosis not present

## 2021-09-29 DIAGNOSIS — M7072 Other bursitis of hip, left hip: Secondary | ICD-10-CM | POA: Diagnosis not present

## 2021-09-29 LAB — CBC WITH DIFFERENTIAL/PLATELET
Abs Immature Granulocytes: 0.03 10*3/uL (ref 0.00–0.07)
Basophils Absolute: 0 10*3/uL (ref 0.0–0.1)
Basophils Relative: 1 %
Eosinophils Absolute: 0.1 10*3/uL (ref 0.0–0.5)
Eosinophils Relative: 2 %
HCT: 40.7 % (ref 39.0–52.0)
Hemoglobin: 13.8 g/dL (ref 13.0–17.0)
Immature Granulocytes: 1 %
Lymphocytes Relative: 24 %
Lymphs Abs: 1.3 10*3/uL (ref 0.7–4.0)
MCH: 31.9 pg (ref 26.0–34.0)
MCHC: 33.9 g/dL (ref 30.0–36.0)
MCV: 94 fL (ref 80.0–100.0)
Monocytes Absolute: 0.4 10*3/uL (ref 0.1–1.0)
Monocytes Relative: 8 %
Neutro Abs: 3.7 10*3/uL (ref 1.7–7.7)
Neutrophils Relative %: 64 %
Platelets: 143 10*3/uL — ABNORMAL LOW (ref 150–400)
RBC: 4.33 MIL/uL (ref 4.22–5.81)
RDW: 13.4 % (ref 11.5–15.5)
WBC: 5.6 10*3/uL (ref 4.0–10.5)
nRBC: 0 % (ref 0.0–0.2)

## 2021-09-29 LAB — COMPREHENSIVE METABOLIC PANEL
ALT: 16 U/L (ref 0–44)
AST: 22 U/L (ref 15–41)
Albumin: 4.2 g/dL (ref 3.5–5.0)
Alkaline Phosphatase: 43 U/L (ref 38–126)
Anion gap: 6 (ref 5–15)
BUN: 24 mg/dL — ABNORMAL HIGH (ref 6–20)
CO2: 28 mmol/L (ref 22–32)
Calcium: 9.3 mg/dL (ref 8.9–10.3)
Chloride: 101 mmol/L (ref 98–111)
Creatinine, Ser: 1.16 mg/dL (ref 0.61–1.24)
GFR, Estimated: >60 mL/min (ref >60)
Glucose, Bld: 114 mg/dL — ABNORMAL HIGH (ref 70–99)
Potassium: 4 mmol/L (ref 3.5–5.1)
Sodium: 135 mmol/L (ref 135–145)
Total Bilirubin: 1 mg/dL (ref 0.3–1.2)
Total Protein: 7.2 g/dL (ref 6.5–8.1)

## 2021-09-29 MED ORDER — HEPARIN SOD (PORK) LOCK FLUSH 100 UNIT/ML IV SOLN
500.0000 [IU] | Freq: Once | INTRAVENOUS | Status: AC | PRN
  Filled 2021-09-29: qty 5

## 2021-09-29 MED ORDER — SODIUM CHLORIDE 0.9 % IV SOLN
500.0000 mg/m2 | Freq: Once | INTRAVENOUS | Status: AC
  Administered 2021-09-29: 1100 mg via INTRAVENOUS
  Filled 2021-09-29: qty 40

## 2021-09-29 MED ORDER — SODIUM CHLORIDE 0.9 % IV SOLN
Freq: Once | INTRAVENOUS | Status: AC
  Filled 2021-09-29: qty 250

## 2021-09-29 MED ORDER — CYANOCOBALAMIN 1000 MCG/ML IJ SOLN
1000.0000 ug | Freq: Once | INTRAMUSCULAR | Status: AC
  Administered 2021-09-29: 1000 ug via INTRAMUSCULAR
  Filled 2021-09-29: qty 1

## 2021-09-29 MED ORDER — DEXAMETHASONE SODIUM PHOSPHATE 10 MG/ML IJ SOLN
6.0000 mg | Freq: Once | INTRAMUSCULAR | Status: AC
  Administered 2021-09-29: 6 mg via INTRAVENOUS
  Filled 2021-09-29: qty 1

## 2021-09-29 MED ORDER — SODIUM CHLORIDE 0.9% FLUSH
10.0000 mL | INTRAVENOUS | Status: DC | PRN
  Administered 2021-09-29: 10 mL
  Filled 2021-09-29: qty 10

## 2021-09-29 MED ORDER — PROCHLORPERAZINE MALEATE 10 MG PO TABS
10.0000 mg | ORAL_TABLET | Freq: Once | ORAL | Status: AC
  Administered 2021-09-29: 10 mg via ORAL
  Filled 2021-09-29: qty 1

## 2021-09-29 MED ORDER — HEPARIN SOD (PORK) LOCK FLUSH 100 UNIT/ML IV SOLN
INTRAVENOUS | Status: AC
  Administered 2021-09-29: 500 [IU]
  Filled 2021-09-29: qty 5

## 2021-09-29 NOTE — Assessment & Plan Note (Addendum)
#   Adenocarcinoma the lung; stage IV; SEP 13th, 2022-  CT scan chest/a/p-shows no evidence of metastatic disease- STABLE. SEP 2022- CT No evidence of recurrent or metastatic carcinoma within the chest, abdomen, or pelvis.  # Proceed with Alimta chemotherapy. Labs today reviewed;  acceptable for treatment today; B12 every third cycle cycle.    # MSK- left hip bursitis s/p injection- Swelling in the hands/feet-grade 1 asymptomatic.? Alimta; monitor-STABLE.Marland Kitchen   #Thoracic aneurysm-approximately 4.5 cm in size; on surveillance-stable. SEP 12th CT scan-STABLE.  Awaiting follow-up with thoracic surgery in [place].  # port malfunction: s/p TPA-   # DISPOSITION:  # Alimta today; B12 injection # Follow up in 3 weeks MD-labs- cbc/cmp; alimta/-Dr.B

## 2021-09-29 NOTE — Progress Notes (Signed)
[facility] OFFICE PROGRESS NOTE  Patient Care Team: [name], MD as PCP - General (Family Medicine) [name], MD as Consulting Physician (Internal Medicine)  Cancer Staging No matching staging information was found for the patient.   Oncology History Overview Note  # 2012- METASTATIC ADENO CA of LEFT LUNG [[place] pattern] s/p MED LN Bx [NEG- EGFR/K-ras/ALK mutation];[name]; [name]; CT JULY  2016- NED; JAN 2018- NED;   # AUG 2020-clinical trial closed; continue maintenance Alimta  # Thoracic aortic aneurysm-4.0 cm in 2016; currently 4.6 cm.  s/p referral to Dr. Faith Rogue.; MRI in 6 months planned.  ----------------------------------------------------------    DIAGNOSIS: [name]  STAGE: 4       ;GOALS: Palliative  CURRENT/MOST RECENT THERAPY '[ ]'  Alimta maintenance    Cancer of hilus of left lung ([place])  07/20/2016 Initial Diagnosis   Cancer of hilus of left lung ([place])   12/11/2019 -  Chemotherapy   Patient is on Treatment Plan : LUNG Pemetrexed (Alimta) q21d       INTERVAL HISTORY:  Mitchell Mosley 60 y.o.  male pleasant patient above history of metastatic adenocarcinoma the lung-NED currently on maintenance Alimta is here for follow-up.  Patient denies any nausea vomiting.  Appetite is good.  No headaches.  Noted to have left hip pain.  Diagnosed with bursitis s/p steroid injection.  He is on meloxicam.  No fatigue.  Continues with physically active.  Review of Systems  Constitutional:  Negative for chills, diaphoresis, fever, malaise/fatigue and weight loss.  HENT:  Negative for nosebleeds and sore throat.   Eyes:  Negative for double vision.  Respiratory:  Negative for hemoptysis, shortness of breath and wheezing.   Cardiovascular:  Positive for leg swelling. Negative for chest pain, palpitations and orthopnea.   Gastrointestinal:  Negative for abdominal pain, blood in stool, constipation, diarrhea, heartburn, melena, nausea and vomiting.  Genitourinary:  Negative for dysuria, frequency and urgency.  Musculoskeletal:  Positive for joint pain.  Skin: Negative.  Negative for itching and rash.  Neurological:  Negative for dizziness, tingling, focal weakness, weakness and headaches.  Endo/Heme/Allergies:  Does not bruise/bleed easily.  Psychiatric/Behavioral:  Negative for depression. The patient is not nervous/anxious and does not have insomnia.      PAST MEDICAL HISTORY :  Past Medical History:  Diagnosis Date   Allergy    Lung cancer ([place])    Mild valvular heart disease Nov. 2014   per 2 D Echocardiogram done for persistent leg edema, monitored by cardiology   Peripheral edema    Thoracic aortic aneurysm (TAA)     PAST SURGICAL HISTORY :   Past Surgical History:  Procedure Laterality Date   ELBOW SURGERY Right 2011   IR CV LINE INJECTION  09/11/2021   Left anterior thoracotomy with biopsy  March 2012   PORTACATH PLACEMENT  March 2012   TONSILLECTOMY  as child    FAMILY HISTORY :   Family History  Problem Relation Age of Onset   Leukemia Father    Diabetes Mother     SOCIAL HISTORY:   Social History   Tobacco Use   Smoking status: Never   Smokeless tobacco: Never  Substance Use Topics   Alcohol use: Yes    Alcohol/week: 0.0 standard drinks    Comment: "social drinker"    Drug use: No    ALLERGIES:  is allergic to penicillins.  MEDICATIONS:  Current Outpatient Medications  Medication Sig Dispense Refill   folic acid (FOLVITE) 1 MG tablet TAKE 1 TABLET BY MOUTH EVERY DAY 90 tablet 3   furosemide (LASIX) 20 MG tablet TAKE 1 TABLET (20 MG TOTAL) BY MOUTH DAILY AS NEEDED FOR SWELLING 90 tablet 1   HYDROcodone-acetaminophen (NORCO) 5-325 MG tablet Take 1 tablet by mouth every 6 (six) hours as needed for moderate pain. 30 tablet 0   meloxicam (MOBIC) 15 MG tablet Take 15 mg  by mouth daily.     Multiple Vitamin (MULTIVITAMIN) capsule Take 1 capsule by mouth daily. Reported on 02/24/2016     NON FORMULARY Relief Factor with vitamin D and Turmeric     Omega-3 Fatty Acids (FISH OIL) 1000 MG CAPS Take 1 capsule by mouth daily. Reported on 02/24/2016     predniSONE (STERAPRED UNI-PAK 21 TAB) 10 MG (21) TBPK tablet Taper as directed. 21 tablet 0   senna (SENOKOT) 8.6 MG tablet Take 1 tablet by mouth daily. Take as needed around time of chemotherapy     sildenafil (VIAGRA) 100 MG tablet TAKE 1 TABLET BY MOUTH EVERY DAY AS NEEDED FOR ERECTILE DYSFUNCTION 20 tablet 11   spironolactone (ALDACTONE) 25 MG tablet TAKE 1 TABLET BY MOUTH EVERY DAY 90 tablet 3   tadalafil (CIALIS) 20 MG tablet Take one tablet every 2 days as needed 50 tablet 5   vitamin B-12 (CYANOCOBALAMIN) 1000 MCG tablet Take 1,000 mcg by mouth daily.     doxycycline (VIBRA-TABS) 100 MG tablet Take 1 tablet (100 mg total) by mouth 2 (two) times daily. 14 tablet 0   meloxicam (MOBIC) 7.5 MG tablet Take 15 mg by mouth daily. (Patient not taking: No sig reported)     Nirmatrelvir & Ritonavir 20 x 150 MG & 10 x 100MG TBPK TAKE 2 NIRMATRELVIR TABLETS AND 1 RITONAVIR TABLET TWICE A DAY FOR 5 DAYS. (Patient not taking: No sig reported) 30 each 0   No current facility-administered medications for this visit.   Facility-Administered Medications Ordered in Other Visits  Medication Dose Route Frequency Provider Last Rate Last Admin   heparin lock flush 100 unit/mL  500 Units Intravenous Once Charlaine Dalton R, MD       sodium chloride 0.9 % injection 10 mL  10 mL Intracatheter PRN [name], MD   10 mL at 05/27/15 1543   sodium chloride 0.9 % injection 10 mL  10 mL Intracatheter PRN [name], MD   10 mL at 06/17/15 1510   sodium chloride 0.9 % injection 10 mL  10 mL Intravenous PRN [name], MD   10 mL at 07/08/15 1340   sodium chloride flush (NS) 0.9 % injection 10 mL  10 mL Intravenous PRN Verlon Au, NP   10 mL at 09/30/20 1412    PHYSICAL EXAMINATION: ECOG PERFORMANCE STATUS: 0 - Asymptomatic  BP 127/79   Pulse 72   Temp 97.7 F (36.5 C)   Wt 207 lb (93.9 kg)   BMI 29.28 kg/m   Filed Weights   09/29/21 1332  Weight: 207 lb (93.9 kg)    Physical Exam HENT:     Head: Normocephalic and atraumatic.     Mouth/Throat:     Pharynx: No oropharyngeal exudate.  Eyes:     Pupils: Pupils are equal, round, and reactive to light.  Cardiovascular:     Rate and Rhythm: Normal rate and regular rhythm.  Pulmonary:     Effort: No respiratory distress.  Breath sounds: No wheezing.  Abdominal:     General: Bowel sounds are normal. There is no distension.     Palpations: Abdomen is soft. There is no mass.     Tenderness: There is no abdominal tenderness. There is no guarding or rebound.  Musculoskeletal:        General: No tenderness. Normal range of motion.     Cervical back: Normal range of motion and neck supple.     Comments: Grade 1 swelling in the legs bilaterally.  Skin:    General: Skin is warm.  Neurological:     Mental Status: He is alert and oriented to person, place, and time.  Psychiatric:        Mood and Affect: Affect normal.     LABORATORY DATA:  I have reviewed the data as listed    Component Value Date/Time   NA 135 09/29/2021 1320   NA 137 06/19/2021 1121   NA 138 12/10/2014 1341   K 4.0 09/29/2021 1320   K 3.6 12/10/2014 1341   CL 101 09/29/2021 1320   CL 102 12/10/2014 1341   CO2 28 09/29/2021 1320   CO2 30 12/10/2014 1341   GLUCOSE 114 (H) 09/29/2021 1320   GLUCOSE 160 (H) 12/10/2014 1341   BUN 24 (H) 09/29/2021 1320   BUN 18 06/19/2021 1121   BUN 21 (H) 12/10/2014 1341   CREATININE 1.16 09/29/2021 1320   CREATININE 1.10 03/25/2015 1453   CALCIUM 9.3 09/29/2021 1320   CALCIUM 8.4 (L) 12/10/2014 1341   PROT 7.2 09/29/2021 1320   PROT 7.3 06/19/2021 1121   PROT 6.6 12/10/2014 1341   ALBUMIN 4.2 09/29/2021 1320   ALBUMIN 4.5  06/19/2021 1121   ALBUMIN 3.4 12/10/2014 1341   AST 22 09/29/2021 1320   AST 17 12/10/2014 1341   ALT 16 09/29/2021 1320   ALT 23 12/10/2014 1341   ALKPHOS 43 09/29/2021 1320   ALKPHOS 50 12/10/2014 1341   BILITOT 1.0 09/29/2021 1320   BILITOT 0.2 06/19/2021 1121   BILITOT 0.5 12/10/2014 1341   GFRNONAA >60 09/29/2021 1320   GFRNONAA >60 03/25/2015 1453   GFRAA >60 08/19/2020 1324   GFRAA >60 03/25/2015 1453    No results found for: SPEP, UPEP  Lab Results  Component Value Date   WBC 5.6 09/29/2021   NEUTROABS 3.7 09/29/2021   HGB 13.8 09/29/2021   HCT 40.7 09/29/2021   MCV 94.0 09/29/2021   PLT 143 (L) 09/29/2021      Chemistry      Component Value Date/Time   NA 135 09/29/2021 1320   NA 137 06/19/2021 1121   NA 138 12/10/2014 1341   K 4.0 09/29/2021 1320   K 3.6 12/10/2014 1341   CL 101 09/29/2021 1320   CL 102 12/10/2014 1341   CO2 28 09/29/2021 1320   CO2 30 12/10/2014 1341   BUN 24 (H) 09/29/2021 1320   BUN 18 06/19/2021 1121   BUN 21 (H) 12/10/2014 1341   CREATININE 1.16 09/29/2021 1320   CREATININE 1.10 03/25/2015 1453      Component Value Date/Time   CALCIUM 9.3 09/29/2021 1320   CALCIUM 8.4 (L) 12/10/2014 1341   ALKPHOS 43 09/29/2021 1320   ALKPHOS 50 12/10/2014 1341   AST 22 09/29/2021 1320   AST 17 12/10/2014 1341   ALT 16 09/29/2021 1320   ALT 23 12/10/2014 1341   BILITOT 1.0 09/29/2021 1320   BILITOT 0.2 06/19/2021 1121   BILITOT 0.5 12/10/2014 1341  RADIOGRAPHIC STUDIES: I have personally reviewed the radiological images as listed and agreed with the findings in the report. DG Chest 2 View  Result Date: 10/04/2021 CLINICAL DATA:  Cough, congestion EXAM: CHEST - 2 VIEW COMPARISON:  10/03/2018 FINDINGS: The heart size and mediastinal contours are within normal limits. Both lungs are clear. The visualized skeletal structures are unremarkable. Stable left subclavian port catheter position, tip mid SVC level. IMPRESSION: No active  cardiopulmonary disease. Electronically Signed   By: Jerilynn Mages.  Shick M.D.   On: 10/04/2021 07:34     ASSESSMENT & PLAN:  Cancer of hilus of left lung ([place]) # Adenocarcinoma the lung; stage IV; SEP 13th, 2022-  CT scan chest/a/p-shows no evidence of metastatic disease- STABLE. SEP 2022- CT No evidence of recurrent or metastatic carcinoma within the chest, abdomen, or pelvis.  # Proceed with Alimta chemotherapy. Labs today reviewed;  acceptable for treatment today; B12 every third cycle cycle.    # MSK- left hip bursitis s/p injection- Swelling in the hands/feet-grade 1 asymptomatic.? Alimta; monitor-STABLE.Marland Kitchen   #Thoracic aneurysm-approximately 4.5 cm in size; on surveillance-stable. SEP 12th CT scan-STABLE.  Awaiting follow-up with thoracic surgery in [place].  # port malfunction: s/p TPA-   # DISPOSITION:  # Alimta today; B12 injection # Follow up in 3 weeks MD-labs- cbc/cmp; alimta/-Dr.B   No orders of the defined types were placed in this encounter.   All questions were answered. The patient knows to call the clinic with any problems, questions or concerns.      [name], MD 10/04/2021 10:12 PM

## 2021-09-29 NOTE — Patient Instructions (Signed)
[facility]  Discharge Instructions: Thank you for choosing [facility] to provide your oncology and hematology care.  If you have a lab appointment with the [facility], please go directly to the [facility] and check in at the registration area.  Wear comfortable clothing and clothing appropriate for easy access to any Portacath or PICC line.   We strive to give you quality time with your provider. You may need to reschedule your appointment if you arrive late (15 or more minutes).  Arriving late affects you and other patients whose appointments are after yours.  Also, if you miss three or more appointments without notifying the office, you may be dismissed from the clinic at the provider's discretion.      For prescription refill requests, have your pharmacy contact our office and allow 72 hours for refills to be completed.    Today you received the following chemotherapy and/or immunotherapy agents - pemetrexed      To help prevent nausea and vomiting after your treatment, we encourage you to take your nausea medication as directed.  BELOW ARE SYMPTOMS THAT SHOULD BE REPORTED IMMEDIATELY: *FEVER GREATER THAN 100.4 F (38 C) OR HIGHER *CHILLS OR SWEATING *NAUSEA AND VOMITING THAT IS NOT CONTROLLED WITH YOUR NAUSEA MEDICATION *UNUSUAL SHORTNESS OF BREATH *UNUSUAL BRUISING OR BLEEDING *URINARY PROBLEMS (pain or burning when urinating, or frequent urination) *BOWEL PROBLEMS (unusual diarrhea, constipation, pain near the anus) TENDERNESS IN MOUTH AND THROAT WITH OR WITHOUT PRESENCE OF ULCERS (sore throat, sores in mouth, or a toothache) UNUSUAL RASH, SWELLING OR PAIN  UNUSUAL VAGINAL DISCHARGE OR ITCHING   Items with * indicate a potential emergency and should be followed up as soon as possible or go to the Emergency Department if any problems should occur.  Please show the CHEMOTHERAPY ALERT CARD or IMMUNOTHERAPY ALERT CARD at  check-in to the Emergency Department and triage nurse.  Should you have questions after your visit or need to cancel or reschedule your appointment, please contact [facility]  [phone] and follow the prompts.  Office hours are 8:00 a.m. to 4:30 p.m. Monday - Friday. Please note that voicemails left after 4:00 p.m. may not be returned until the following business day.  We are closed weekends and major holidays. You have access to a nurse at all times for urgent questions. Please call the main number to the clinic [phone] and follow the prompts.  For any non-urgent questions, you may also contact your provider using MyChart. We now offer e-Visits for anyone 8 and older to request care online for non-urgent symptoms. For details visit [email or web address].   Also download the MyChart app! Go to the app store, search "MyChart", open the app, select [facility], and log in with your MyChart username and password.  Due to Covid, a mask is required upon entering the hospital/clinic. If you do not have a mask, one will be given to you upon arrival. For doctor visits, patients may have 1 support person aged 23 or older with them. For treatment visits, patients cannot have anyone with them due to current Covid guidelines and our immunocompromised population.   Pemetrexed injection What is this medication? PEMETREXED (PEM e TREX ed) is a chemotherapy drug used to treat lung cancers like non-small cell lung cancer and mesothelioma. It may also be used to treat other cancers. This medicine may be used for other purposes; ask your health care provider or pharmacist if you have  questions. COMMON BRAND NAME(S): Alimta, PEMFEXY What should I tell my care team before I take this medication? They need to know if you have any of these conditions: infection (especially a virus infection such as chickenpox, cold sores, or herpes) kidney disease low blood counts, like  low white cell, platelet, or red cell counts lung or breathing disease, like asthma radiation therapy an unusual or allergic reaction to pemetrexed, other medicines, foods, dyes, or preservative pregnant or trying to get pregnant breast-feeding How should I use this medication? This drug is given as an infusion into a vein. It is administered in a hospital or clinic by a specially trained health care professional. Talk to your pediatrician regarding the use of this medicine in children. Special care may be needed. Overdosage: If you think you have taken too much of this medicine contact a poison control center or emergency room at once. NOTE: This medicine is only for you. Do not share this medicine with others. What if I miss a dose? It is important not to miss your dose. Call your doctor or health care professional if you are unable to keep an appointment. What may interact with this medication? This medicine may interact with the following medications: Ibuprofen This list may not describe all possible interactions. Give your health care provider a list of all the medicines, herbs, non-prescription drugs, or dietary supplements you use. Also tell them if you smoke, drink alcohol, or use illegal drugs. Some items may interact with your medicine. What should I watch for while using this medication? Visit your doctor for checks on your progress. This drug may make you feel generally unwell. This is not uncommon, as chemotherapy can affect healthy cells as well as cancer cells. Report any side effects. Continue your course of treatment even though you feel ill unless your doctor tells you to stop. In some cases, you may be given additional medicines to help with side effects. Follow all directions for their use. Call your doctor or health care professional for advice if you get a fever, chills or sore throat, or other symptoms of a cold or flu. Do not treat yourself. This drug decreases your body's  ability to fight infections. Try to avoid being around people who are sick. This medicine may increase your risk to bruise or bleed. Call your doctor or health care professional if you notice any unusual bleeding. Be careful brushing and flossing your teeth or using a toothpick because you may get an infection or bleed more easily. If you have any dental work done, tell your dentist you are receiving this medicine. Avoid taking products that contain aspirin, acetaminophen, ibuprofen, naproxen, or ketoprofen unless instructed by your doctor. These medicines may hide a fever. Call your doctor or health care professional if you get diarrhea or mouth sores. Do not treat yourself. To protect your kidneys, drink water or other fluids as directed while you are taking this medicine. Do not become pregnant while taking this medicine or for 6 months after stopping it. Women should inform their doctor if they wish to become pregnant or think they might be pregnant. Men should not father a child while taking this medicine and for 3 months after stopping it. This may interfere with the ability to father a child. You should talk to your doctor or health care professional if you are concerned about your fertility. There is a potential for serious side effects to an unborn child. Talk to your health care professional or pharmacist  for more information. Do not breast-feed an infant while taking this medicine or for 1 week after stopping it. What side effects may I notice from receiving this medication? Side effects that you should report to your doctor or health care professional as soon as possible: allergic reactions like skin rash, itching or hives, swelling of the face, lips, or tongue breathing problems redness, blistering, peeling or loosening of the skin, including inside the mouth signs and symptoms of bleeding such as bloody or black, tarry stools; red or dark-brown urine; spitting up blood or brown material  that looks like coffee grounds; red spots on the skin; unusual bruising or bleeding from the eye, gums, or nose signs and symptoms of infection like fever or chills; cough; sore throat; pain or trouble passing urine signs and symptoms of kidney injury like trouble passing urine or change in the amount of urine signs and symptoms of liver injury like dark yellow or brown urine; general ill feeling or flu-like symptoms; light-colored stools; loss of appetite; nausea; right upper belly pain; unusually weak or tired; yellowing of the eyes or skin Side effects that usually do not require medical attention (report to your doctor or health care professional if they continue or are bothersome): constipation mouth sores nausea, vomiting unusually weak or tired This list may not describe all possible side effects. Call your doctor for medical advice about side effects. You may report side effects to FDA at 1-800-FDA-1088. Where should I keep my medication? This drug is given in a hospital or clinic and will not be stored at home. NOTE: This sheet is a summary. It may not cover all possible information. If you have questions about this medicine, talk to your doctor, pharmacist, or health care provider.  2022 Elsevier/Gold Standard (2018-01-08 16:11:33)

## 2021-09-29 NOTE — Progress Notes (Signed)
Patient denies new problems/concerns today.   °

## 2021-09-29 NOTE — Progress Notes (Signed)
No TPA today per Dr. Rogue Bussing - port with brisk blood return.

## 2021-10-03 ENCOUNTER — Ambulatory Visit
Admission: RE | Admit: 2021-10-03 | Discharge: 2021-10-03 | Disposition: A | Discharge status: Home / Self Care | Payer: BC Managed Care – PPO | Attending: Family Medicine | Admitting: Family Medicine

## 2021-10-03 ENCOUNTER — Telehealth: Payer: Self-pay

## 2021-10-03 ENCOUNTER — Ambulatory Visit
Admission: RE | Admit: 2021-10-03 | Discharge: 2021-10-03 | Disposition: A | Discharge status: Home / Self Care | Payer: BC Managed Care – PPO | Source: Ambulatory Visit | Attending: Family Medicine | Admitting: Family Medicine

## 2021-10-03 ENCOUNTER — Ambulatory Visit: Payer: Self-pay

## 2021-10-03 DIAGNOSIS — R059 Cough, unspecified: Secondary | ICD-10-CM

## 2021-10-03 MED ORDER — DOXYCYCLINE HYCLATE 100 MG PO TABS
100.0000 mg | ORAL_TABLET | Freq: Two times a day (BID) | ORAL | 0 refills | Status: DC
Start: 2021-10-03 — End: 2021-11-10

## 2021-10-03 NOTE — Addendum Note (Signed)
Addended by: Althea Charon D on: 10/03/2021 04:01 PM   Modules accepted: Orders

## 2021-10-03 NOTE — Telephone Encounter (Signed)
Pt called back after doing a home covid test and the result was negative.

## 2021-10-03 NOTE — Telephone Encounter (Signed)
Pt with lung cancer currently receiving chemotherapy. Last Chemo was 09/29/21. Same day., pt began having a mostly dry cough (with occasional yellow phlegm). No blood seen in phlegm. Denies SOB at rest or with activity. Pt stated he is having cold chills along with sweating to his forehead and both arms.  Pt c/o runny nose (with clear mucous). Pt stated his hearing is impaired. Pt stated he hears the sound as if cars are passing him like he would "hear at a Nascar race").  Pt stated he will take a covid test and call back. Pt is immunocompromised.  Called the office and spoke to [place]. Was advised to go ahead and send triage note to office so pt's PCP may review.  Pt stated his oncologist advised to call PCP for assistance.  Care advice given and pt verbalized understanding.            Reason for Disposition  [1] Known COPD or other severe lung disease (i.e., bronchiectasis, cystic fibrosis, lung surgery) AND [2] worsening symptoms (i.e., increased sputum purulence or amount, increased breathing difficulty  Answer Assessment - Initial Assessment Questions 1. ONSET: "When did the cough begin?"      Friday last week 2. SEVERITY: "How bad is the cough today?"      Occasional productive 3. SPUTUM: "Describe the color of your sputum" (none, dry cough; clear, white, yellow, green)     yellow 4. HEMOPTYSIS: "Are you coughing up any blood?" If so ask: "How much?" (flecks, streaks, tablespoons, etc.)     no 5. DIFFICULTY BREATHING: "Are you having difficulty breathing?" If Yes, ask: "How bad is it?" (e.g., mild, moderate, severe)    - MILD: No SOB at rest, mild SOB with walking, speaks normally in sentences, can lie down, no retractions, pulse < 100.    - MODERATE: SOB at rest, SOB with minimal exertion and prefers to sit, cannot lie down flat, speaks in phrases, mild retractions, audible wheezing, pulse 100-120.    - SEVERE: Very SOB at rest, speaks in single words, struggling to breathe,  sitting hunched forward, retractions, pulse > 120      mild 6. FEVER: "Do you have a fever?" If Yes, ask: "What is your temperature, how was it measured, and when did it start?"     Subjective fever- constant sweating forearm, fore head sitting at rest 7. CARDIAC HISTORY: "Do you have any history of heart disease?" (e.g., heart attack, congestive heart failure)      no 8. LUNG HISTORY: "Do you have any history of lung disease?"  (e.g., pulmonary embolus, asthma, emphysema)     Lung cancer 9. PE RISK FACTORS: "Do you have a history of blood clots?" (or: recent major surgery, recent prolonged travel, bedridden)     no 10. OTHER SYMPTOMS: "Do you have any other symptoms?" (e.g., runny nose, wheezing, chest pain)       Runny nose-clear 11. PREGNANCY: "Is there any chance you are pregnant?" "When was your last menstrual period?"       N/a 12. TRAVEL: "Have you traveled out of the country in the last month?" (e.g., travel history, exposures)       no  Protocols used: Cough - Acute Productive-A-AH

## 2021-10-04 ENCOUNTER — Encounter: Payer: Self-pay | Admitting: Internal Medicine

## 2021-10-04 ENCOUNTER — Encounter: Payer: Self-pay | Admitting: *Deleted

## 2021-10-04 NOTE — Telephone Encounter (Signed)
This encounter was created in error - please disregard.  Please see previous encounter with cxray results.

## 2021-10-04 NOTE — Telephone Encounter (Signed)
Reviewed Dr.Gilbert's cxray results note with the patient. The cxray was negative-Dr. Rosanna Randy did not specify if he would like the patient to continue to take the Doxy.  Please advise further.

## 2021-10-05 NOTE — Telephone Encounter (Signed)
Pt advised.   Thanks,   -Kadesha Virrueta  

## 2021-10-20 ENCOUNTER — Other Ambulatory Visit: Payer: Self-pay

## 2021-10-20 ENCOUNTER — Encounter: Payer: Self-pay | Admitting: Internal Medicine

## 2021-10-20 ENCOUNTER — Inpatient Hospital Stay: Payer: BC Managed Care – PPO | Attending: Internal Medicine

## 2021-10-20 ENCOUNTER — Inpatient Hospital Stay: Payer: BC Managed Care – PPO

## 2021-10-20 ENCOUNTER — Inpatient Hospital Stay (HOSPITAL_BASED_OUTPATIENT_CLINIC_OR_DEPARTMENT_OTHER): Payer: BC Managed Care – PPO | Admitting: Internal Medicine

## 2021-10-20 DIAGNOSIS — M7072 Other bursitis of hip, left hip: Secondary | ICD-10-CM | POA: Insufficient documentation

## 2021-10-20 DIAGNOSIS — Z5111 Encounter for antineoplastic chemotherapy: Secondary | ICD-10-CM | POA: Insufficient documentation

## 2021-10-20 DIAGNOSIS — I712 Thoracic aortic aneurysm, without rupture, unspecified: Secondary | ICD-10-CM | POA: Insufficient documentation

## 2021-10-20 DIAGNOSIS — C3402 Malignant neoplasm of left main bronchus: Secondary | ICD-10-CM | POA: Diagnosis not present

## 2021-10-20 DIAGNOSIS — Z8679 Personal history of other diseases of the circulatory system: Secondary | ICD-10-CM | POA: Insufficient documentation

## 2021-10-20 DIAGNOSIS — Z806 Family history of leukemia: Secondary | ICD-10-CM | POA: Diagnosis not present

## 2021-10-20 DIAGNOSIS — E538 Deficiency of other specified B group vitamins: Secondary | ICD-10-CM | POA: Insufficient documentation

## 2021-10-20 LAB — COMPREHENSIVE METABOLIC PANEL
ALT: 19 U/L (ref 0–44)
AST: 24 U/L (ref 15–41)
Albumin: 4 g/dL (ref 3.5–5.0)
Alkaline Phosphatase: 49 U/L (ref 38–126)
Anion gap: 8 (ref 5–15)
BUN: 21 mg/dL — ABNORMAL HIGH (ref 6–20)
CO2: 27 mmol/L (ref 22–32)
Calcium: 8.6 mg/dL — ABNORMAL LOW (ref 8.9–10.3)
Chloride: 99 mmol/L (ref 98–111)
Creatinine, Ser: 1.01 mg/dL (ref 0.61–1.24)
GFR, Estimated: >60 mL/min (ref >60)
Glucose, Bld: 163 mg/dL — ABNORMAL HIGH (ref 70–99)
Potassium: 4 mmol/L (ref 3.5–5.1)
Sodium: 134 mmol/L — ABNORMAL LOW (ref 135–145)
Total Bilirubin: 0.2 mg/dL — ABNORMAL LOW (ref 0.3–1.2)
Total Protein: 7 g/dL (ref 6.5–8.1)

## 2021-10-20 LAB — CBC WITH DIFFERENTIAL/PLATELET
Abs Immature Granulocytes: 0.02 10*3/uL (ref 0.00–0.07)
Basophils Absolute: 0 10*3/uL (ref 0.0–0.1)
Basophils Relative: 1 %
Eosinophils Absolute: 0.1 10*3/uL (ref 0.0–0.5)
Eosinophils Relative: 2 %
HCT: 40.5 % (ref 39.0–52.0)
Hemoglobin: 13.8 g/dL (ref 13.0–17.0)
Immature Granulocytes: 0 %
Lymphocytes Relative: 38 %
Lymphs Abs: 2.1 10*3/uL (ref 0.7–4.0)
MCH: 32.1 pg (ref 26.0–34.0)
MCHC: 34.1 g/dL (ref 30.0–36.0)
MCV: 94.2 fL (ref 80.0–100.0)
Monocytes Absolute: 0.6 10*3/uL (ref 0.1–1.0)
Monocytes Relative: 10 %
Neutro Abs: 2.6 10*3/uL (ref 1.7–7.7)
Neutrophils Relative %: 49 %
Platelets: 191 10*3/uL (ref 150–400)
RBC: 4.3 MIL/uL (ref 4.22–5.81)
RDW: 13.6 % (ref 11.5–15.5)
WBC: 5.4 10*3/uL (ref 4.0–10.5)
nRBC: 0 % (ref 0.0–0.2)

## 2021-10-20 MED ORDER — SODIUM CHLORIDE 0.9 % IV SOLN
Freq: Once | INTRAVENOUS | Status: AC
  Filled 2021-10-20: qty 250

## 2021-10-20 MED ORDER — HEPARIN SOD (PORK) LOCK FLUSH 100 UNIT/ML IV SOLN
INTRAVENOUS | Status: AC
  Administered 2021-10-20: 500 [IU]
  Filled 2021-10-20: qty 5

## 2021-10-20 MED ORDER — DEXAMETHASONE SODIUM PHOSPHATE 10 MG/ML IJ SOLN
6.0000 mg | Freq: Once | INTRAMUSCULAR | Status: AC
  Administered 2021-10-20: 6 mg via INTRAVENOUS
  Filled 2021-10-20: qty 1

## 2021-10-20 MED ORDER — PROCHLORPERAZINE MALEATE 10 MG PO TABS
10.0000 mg | ORAL_TABLET | Freq: Once | ORAL | Status: AC
  Administered 2021-10-20: 10 mg via ORAL
  Filled 2021-10-20: qty 1

## 2021-10-20 MED ORDER — HEPARIN SOD (PORK) LOCK FLUSH 100 UNIT/ML IV SOLN
500.0000 [IU] | Freq: Once | INTRAVENOUS | Status: AC | PRN
  Filled 2021-10-20: qty 5

## 2021-10-20 MED ORDER — SODIUM CHLORIDE 0.9 % IV SOLN
500.0000 mg/m2 | Freq: Once | INTRAVENOUS | Status: AC
  Administered 2021-10-20: 1100 mg via INTRAVENOUS
  Filled 2021-10-20: qty 40

## 2021-10-20 NOTE — Assessment & Plan Note (Signed)
#   Adenocarcinoma the lung; stage IV;  SEP 2022- CT No evidence of recurrent or metastatic carcinoma within the chest, abdomen, or pelvis. STABLE  # Proceed with Alimta chemotherapy. Labs today reviewed;  acceptable for treatment today; B12 every third cycle cycle.    # MSK- left hip bursitis s/p injection- Swelling in the hands/feet-grade 1 asymptomatic.? Alimta; monitor-STABLE.   #Thoracic aneurysm-approximately 4.5 cm in size; on surveillance-stable. SEP 12th CT scan-STABLE.  Awaiting follow-up with thoracic surgery in [place].  # port malfunction: s/p TPA-   *b12 next visit-order # DISPOSITION:  # Alimta today;  # Follow up in 3 weeks MD-labs- cbc/cmp; alimta/-Dr.B

## 2021-10-20 NOTE — Progress Notes (Signed)
[facility] OFFICE PROGRESS NOTE  Patient Care Team: [name], MD as PCP - General (Family Medicine) [name], MD as Consulting Physician (Internal Medicine)   Cancer Staging  No matching staging information was found for the patient.   Oncology History Overview Note  # 2012- METASTATIC ADENO CA of LEFT LUNG [acinar pattern] s/p MED LN Bx [NEG- EGFR/K-ras/ALK mutation];[name]; [name]; CT JULY  2016- NED; JAN 2018- NED;   # AUG 2020-clinical trial closed; continue maintenance Alimta  # Thoracic aortic aneurysm-4.0 cm in 2016; currently 4.6 cm.  s/p referral to Dr. Faith Rogue.; MRI in 6 months planned.  ----------------------------------------------------------    DIAGNOSIS: [name]  STAGE: 4       ;GOALS: Palliative  CURRENT/MOST RECENT THERAPY '[ ]'  Alimta maintenance    Cancer of hilus of left lung ([place])  07/20/2016 Initial Diagnosis   Cancer of hilus of left lung ([place])   12/11/2019 -  Chemotherapy   Patient is on Treatment Plan : LUNG Pemetrexed (Alimta) q21d       INTERVAL HISTORY:  Mitchell Mosley 60 y.o.  male pleasant patient above history of metastatic adenocarcinoma the lung-NED currently on maintenance Alimta is here for follow-up.  In the interim patient noted to have URI.  S/p antibiotics antihistaminics improved.  Denies any headaches.  No nausea vomiting.  No fever no chills.  Review of Systems  Constitutional:  Negative for chills, diaphoresis, fever, malaise/fatigue and weight loss.  HENT:  Negative for nosebleeds and sore throat.   Eyes:  Negative for double vision.  Respiratory:  Negative for hemoptysis, shortness of breath and wheezing.   Cardiovascular:  Positive for leg swelling. Negative for chest pain, palpitations and orthopnea.  Gastrointestinal:  Negative for abdominal pain, blood in stool,  constipation, diarrhea, heartburn, melena, nausea and vomiting.  Genitourinary:  Negative for dysuria, frequency and urgency.  Musculoskeletal:  Positive for joint pain.  Skin: Negative.  Negative for itching and rash.  Neurological:  Negative for dizziness, tingling, focal weakness, weakness and headaches.  Endo/Heme/Allergies:  Does not bruise/bleed easily.  Psychiatric/Behavioral:  Negative for depression. The patient is not nervous/anxious and does not have insomnia.      PAST MEDICAL HISTORY :  Past Medical History:  Diagnosis Date   Allergy    Lung cancer ([place])    Mild valvular heart disease Nov. 2014   per 2 D Echocardiogram done for persistent leg edema, monitored by cardiology   Peripheral edema    Thoracic aortic aneurysm (TAA)     PAST SURGICAL HISTORY :   Past Surgical History:  Procedure Laterality Date   ELBOW SURGERY Right 2011   IR CV LINE INJECTION  09/11/2021   Left anterior thoracotomy with biopsy  March 2012   PORTACATH PLACEMENT  March 2012   TONSILLECTOMY  as child    FAMILY HISTORY :   Family History  Problem Relation Age of Onset   Leukemia Father    Diabetes Mother     SOCIAL HISTORY:   Social History   Tobacco Use   Smoking status: Never   Smokeless tobacco: Never  Substance Use Topics   Alcohol use: Yes    Alcohol/week: 0.0 standard drinks    Comment: "social drinker"    Drug use: No    ALLERGIES:  is allergic to penicillins.  MEDICATIONS:  Current Outpatient Medications  Medication Sig Dispense Refill  folic acid (FOLVITE) 1 MG tablet TAKE 1 TABLET BY MOUTH EVERY DAY 90 tablet 3   furosemide (LASIX) 20 MG tablet TAKE 1 TABLET (20 MG TOTAL) BY MOUTH DAILY AS NEEDED FOR SWELLING 90 tablet 1   meloxicam (MOBIC) 15 MG tablet Take 15 mg by mouth daily.     Multiple Vitamin (MULTIVITAMIN) capsule Take 1 capsule by mouth daily. Reported on 02/24/2016     NON FORMULARY Relief Factor with vitamin D and Turmeric     Omega-3 Fatty Acids  (FISH OIL) 1000 MG CAPS Take 1 capsule by mouth daily. Reported on 02/24/2016     senna (SENOKOT) 8.6 MG tablet Take 1 tablet by mouth daily. Take as needed around time of chemotherapy     spironolactone (ALDACTONE) 25 MG tablet TAKE 1 TABLET BY MOUTH EVERY DAY 90 tablet 3   vitamin B-12 (CYANOCOBALAMIN) 1000 MCG tablet Take 1,000 mcg by mouth daily.     doxycycline (VIBRA-TABS) 100 MG tablet Take 1 tablet (100 mg total) by mouth 2 (two) times daily. (Patient not taking: Reported on 10/20/2021) 14 tablet 0   HYDROcodone-acetaminophen (NORCO) 5-325 MG tablet Take 1 tablet by mouth every 6 (six) hours as needed for moderate pain. (Patient not taking: Reported on 10/20/2021) 30 tablet 0   meloxicam (MOBIC) 7.5 MG tablet Take 15 mg by mouth daily. (Patient not taking: Reported on 09/08/2021)     Nirmatrelvir & Ritonavir 20 x 150 MG & 10 x 100MG TBPK TAKE 2 NIRMATRELVIR TABLETS AND 1 RITONAVIR TABLET TWICE A DAY FOR 5 DAYS. (Patient not taking: Reported on 07/11/2021) 30 each 0   predniSONE (STERAPRED UNI-PAK 21 TAB) 10 MG (21) TBPK tablet Taper as directed. (Patient not taking: Reported on 10/20/2021) 21 tablet 0   sildenafil (VIAGRA) 100 MG tablet TAKE 1 TABLET BY MOUTH EVERY DAY AS NEEDED FOR ERECTILE DYSFUNCTION (Patient not taking: Reported on 10/20/2021) 20 tablet 11   tadalafil (CIALIS) 20 MG tablet Take one tablet every 2 days as needed (Patient not taking: Reported on 10/20/2021) 50 tablet 5   No current facility-administered medications for this visit.   Facility-Administered Medications Ordered in Other Visits  Medication Dose Route Frequency Provider Last Rate Last Admin   heparin lock flush 100 unit/mL  500 Units Intravenous Once Charlaine Dalton R, MD       sodium chloride 0.9 % injection 10 mL  10 mL Intracatheter PRN [name], MD   10 mL at 05/27/15 1543   sodium chloride 0.9 % injection 10 mL  10 mL Intracatheter PRN [name], MD   10 mL at 06/17/15 1510   sodium chloride  0.9 % injection 10 mL  10 mL Intravenous PRN [name], MD   10 mL at 07/08/15 1340   sodium chloride flush (NS) 0.9 % injection 10 mL  10 mL Intravenous PRN Verlon Au, NP   10 mL at 09/30/20 1412    PHYSICAL EXAMINATION: ECOG PERFORMANCE STATUS: 0 - Asymptomatic  BP 130/81   Pulse 95   Temp 97.6 F (36.4 C)   Resp 18   Wt 213 lb 6.4 oz (96.8 kg)   BMI 30.19 kg/m   Filed Weights   10/20/21 1332  Weight: 213 lb 6.4 oz (96.8 kg)    Physical Exam HENT:     Head: Normocephalic and atraumatic.     Mouth/Throat:     Pharynx: No oropharyngeal exudate.  Eyes:     Pupils: Pupils are equal, round, and reactive to  light.  Cardiovascular:     Rate and Rhythm: Normal rate and regular rhythm.  Pulmonary:     Effort: No respiratory distress.     Breath sounds: No wheezing.  Abdominal:     General: Bowel sounds are normal. There is no distension.     Palpations: Abdomen is soft. There is no mass.     Tenderness: There is no abdominal tenderness. There is no guarding or rebound.  Musculoskeletal:        General: No tenderness. Normal range of motion.     Cervical back: Normal range of motion and neck supple.     Comments: Grade 1 swelling in the legs bilaterally.  Skin:    General: Skin is warm.  Neurological:     Mental Status: He is alert and oriented to person, place, and time.  Psychiatric:        Mood and Affect: Affect normal.     LABORATORY DATA:  I have reviewed the data as listed    Component Value Date/Time   NA 134 (L) 10/20/2021 1316   NA 137 06/19/2021 1121   NA 138 12/10/2014 1341   K 4.0 10/20/2021 1316   K 3.6 12/10/2014 1341   CL 99 10/20/2021 1316   CL 102 12/10/2014 1341   CO2 27 10/20/2021 1316   CO2 30 12/10/2014 1341   GLUCOSE 163 (H) 10/20/2021 1316   GLUCOSE 160 (H) 12/10/2014 1341   BUN 21 (H) 10/20/2021 1316   BUN 18 06/19/2021 1121   BUN 21 (H) 12/10/2014 1341   CREATININE 1.01 10/20/2021 1316   CREATININE 1.10 03/25/2015 1453    CALCIUM 8.6 (L) 10/20/2021 1316   CALCIUM 8.4 (L) 12/10/2014 1341   PROT 7.0 10/20/2021 1316   PROT 7.3 06/19/2021 1121   PROT 6.6 12/10/2014 1341   ALBUMIN 4.0 10/20/2021 1316   ALBUMIN 4.5 06/19/2021 1121   ALBUMIN 3.4 12/10/2014 1341   AST 24 10/20/2021 1316   AST 17 12/10/2014 1341   ALT 19 10/20/2021 1316   ALT 23 12/10/2014 1341   ALKPHOS 49 10/20/2021 1316   ALKPHOS 50 12/10/2014 1341   BILITOT 0.2 (L) 10/20/2021 1316   BILITOT 0.2 06/19/2021 1121   BILITOT 0.5 12/10/2014 1341   GFRNONAA >60 10/20/2021 1316   GFRNONAA >60 03/25/2015 1453   GFRAA >60 08/19/2020 1324   GFRAA >60 03/25/2015 1453    No results found for: SPEP, UPEP  Lab Results  Component Value Date   WBC 5.4 10/20/2021   NEUTROABS 2.6 10/20/2021   HGB 13.8 10/20/2021   HCT 40.5 10/20/2021   MCV 94.2 10/20/2021   PLT 191 10/20/2021      Chemistry      Component Value Date/Time   NA 134 (L) 10/20/2021 1316   NA 137 06/19/2021 1121   NA 138 12/10/2014 1341   K 4.0 10/20/2021 1316   K 3.6 12/10/2014 1341   CL 99 10/20/2021 1316   CL 102 12/10/2014 1341   CO2 27 10/20/2021 1316   CO2 30 12/10/2014 1341   BUN 21 (H) 10/20/2021 1316   BUN 18 06/19/2021 1121   BUN 21 (H) 12/10/2014 1341   CREATININE 1.01 10/20/2021 1316   CREATININE 1.10 03/25/2015 1453      Component Value Date/Time   CALCIUM 8.6 (L) 10/20/2021 1316   CALCIUM 8.4 (L) 12/10/2014 1341   ALKPHOS 49 10/20/2021 1316   ALKPHOS 50 12/10/2014 1341   AST 24 10/20/2021 1316   AST 17  12/10/2014 1341   ALT 19 10/20/2021 1316   ALT 23 12/10/2014 1341   BILITOT 0.2 (L) 10/20/2021 1316   BILITOT 0.2 06/19/2021 1121   BILITOT 0.5 12/10/2014 1341       RADIOGRAPHIC STUDIES: I have personally reviewed the radiological images as listed and agreed with the findings in the report. No results found.   ASSESSMENT & PLAN:  Cancer of hilus of left lung ([place]) # Adenocarcinoma the lung; stage IV;  SEP 2022- CT No evidence of recurrent  or metastatic carcinoma within the chest, abdomen, or pelvis. STABLE  # Proceed with Alimta chemotherapy. Labs today reviewed;  acceptable for treatment today; B12 every third cycle cycle.    # MSK- left hip bursitis s/p injection- Swelling in the hands/feet-grade 1 asymptomatic.? Alimta; monitor-STABLE.   #Thoracic aneurysm-approximately 4.5 cm in size; on surveillance-stable. SEP 12th CT scan-STABLE.  Awaiting follow-up with thoracic surgery in [place].  # port malfunction: s/p TPA-   *b12 next visit-order # DISPOSITION:  # Alimta today;  # Follow up in 3 weeks MD-labs- cbc/cmp; alimta/-Dr.B   No orders of the defined types were placed in this encounter.   All questions were answered. The patient knows to call the clinic with any problems, questions or concerns.      [name], MD 10/20/2021 3:04 PM

## 2021-10-20 NOTE — Patient Instructions (Signed)
[facility]   Discharge Instructions: Thank you for choosing [facility] to provide your oncology and hematology care.  If you have a lab appointment with the [facility], please go directly to the [facility] and check in at the registration area.  Wear comfortable clothing and clothing appropriate for easy access to any Portacath or PICC line.   We strive to give you quality time with your provider. You may need to reschedule your appointment if you arrive late (15 or more minutes).  Arriving late affects you and other patients whose appointments are after yours.  Also, if you miss three or more appointments without notifying the office, you may be dismissed from the clinic at the provider's discretion.      For prescription refill requests, have your pharmacy contact our office and allow 72 hours for refills to be completed.    Today you received the following chemotherapy and/or immunotherapy agents - Alimta      To help prevent nausea and vomiting after your treatment, we encourage you to take your nausea medication as directed.  BELOW ARE SYMPTOMS THAT SHOULD BE REPORTED IMMEDIATELY: *FEVER GREATER THAN 100.4 F (38 C) OR HIGHER *CHILLS OR SWEATING *NAUSEA AND VOMITING THAT IS NOT CONTROLLED WITH YOUR NAUSEA MEDICATION *UNUSUAL SHORTNESS OF BREATH *UNUSUAL BRUISING OR BLEEDING *URINARY PROBLEMS (pain or burning when urinating, or frequent urination) *BOWEL PROBLEMS (unusual diarrhea, constipation, pain near the anus) TENDERNESS IN MOUTH AND THROAT WITH OR WITHOUT PRESENCE OF ULCERS (sore throat, sores in mouth, or a toothache) UNUSUAL RASH, SWELLING OR PAIN  UNUSUAL VAGINAL DISCHARGE OR ITCHING   Items with * indicate a potential emergency and should be followed up as soon as possible or go to the Emergency Department if any problems should occur.  Please show the CHEMOTHERAPY ALERT CARD or IMMUNOTHERAPY ALERT CARD at check-in  to the Emergency Department and triage nurse.  Should you have questions after your visit or need to cancel or reschedule your appointment, please contact [facility]  [phone] and follow the prompts.  Office hours are 8:00 a.m. to 4:30 p.m. Monday - Friday. Please note that voicemails left after 4:00 p.m. may not be returned until the following business day.  We are closed weekends and major holidays. You have access to a nurse at all times for urgent questions. Please call the main number to the clinic [phone] and follow the prompts.  For any non-urgent questions, you may also contact your provider using MyChart. We now offer e-Visits for anyone 64 and older to request care online for non-urgent symptoms. For details visit [email or web address].   Also download the MyChart app! Go to the app store, search "MyChart", open the app, select [facility], and log in with your MyChart username and password.  Due to Covid, a mask is required upon entering the hospital/clinic. If you do not have a mask, one will be given to you upon arrival. For doctor visits, patients may have 1 support person aged 78 or older with them. For treatment visits, patients cannot have anyone with them due to current Covid guidelines and our immunocompromised population.   Dexamethasone injection What is this medication? DEXAMETHASONE (dex a METH a sone) is a corticosteroid. It is used to treat inflammation of the skin, joints, lungs, and other organs. Common conditions treated include asthma, allergies, and arthritis. It is also used for other conditions, like blood disorders and diseases of the adrenal glands.  This medicine may be used for other purposes; ask your health care provider or pharmacist if you have questions. COMMON BRAND NAME(S): Decadron, DoubleDex, ReadySharp Dexamethasone, Simplist Dexamethasone, Solurex What should I tell my care team before I take this  medication? They need to know if you have any of these conditions: Cushing's syndrome diabetes glaucoma heart disease high blood pressure infection like herpes, measles, tuberculosis, or chickenpox kidney disease liver disease mental illness myasthenia gravis osteoporosis previous heart attack seizures stomach or intestine problems thyroid disease an unusual or allergic reaction to dexamethasone, corticosteroids, other medicines, lactose, foods, dyes, or preservatives pregnant or trying to get pregnant breast-feeding How should I use this medication? This medicine is for injection into a muscle, joint, lesion, soft tissue, or vein. It is given by a health care professional in a hospital or clinic setting. Talk to your pediatrician regarding the use of this medicine in children. Special care may be needed. Overdosage: If you think you have taken too much of this medicine contact a poison control center or emergency room at once. NOTE: This medicine is only for you. Do not share this medicine with others. What if I miss a dose? This may not apply. If you are having a series of injections over a prolonged period, try not to miss an appointment. Call your doctor or health care professional to reschedule if you are unable to keep an appointment. What may interact with this medication? Do not take this medicine with any of the following medications: live virus vaccines This medicine may also interact with the following medications: aminoglutethimide amphotericin B aspirin and aspirin-like medicines certain antibiotics like erythromycin, clarithromycin, and troleandomycin certain antivirals for HIV or hepatitis certain medicines for seizures like carbamazepine, phenobarbital, phenytoin certain medicines to treat myasthenia gravis cholestyramine cyclosporine digoxin diuretics ephedrine male hormones, like estrogen or progestins and birth control pills insulin or other medicines  for diabetes isoniazid ketoconazole medicines that relax muscles for surgery mifepristone NSAIDs, medicines for pain and inflammation, like ibuprofen or naproxen rifampin skin tests for allergies thalidomide vaccines warfarin This list may not describe all possible interactions. Give your health care provider a list of all the medicines, herbs, non-prescription drugs, or dietary supplements you use. Also tell them if you smoke, drink alcohol, or use illegal drugs. Some items may interact with your medicine. What should I watch for while using this medication? Visit your health care professional for regular checks on your progress. Tell your health care professional if your symptoms do not start to get better or if they get worse. Your condition will be monitored carefully while you are receiving this medicine. Wear a medical ID bracelet or chain. Carry a card that describes your disease and details of your medicine and dosage times. This medicine may increase your risk of getting an infection. Call your health care professional for advice if you get a fever, chills, or sore throat, or other symptoms of a cold or flu. Do not treat yourself. Try to avoid being around people who are sick. Call your health care professional if you are around anyone with measles, chickenpox, or if you develop sores or blisters that do not heal properly. If you are going to need surgery or other procedures, tell your doctor or health care professional that you have taken this medicine within the last 12 months. Ask your doctor or health care professional about your diet. You may need to lower the amount of salt you eat. This medicine may increase blood sugar.  Ask your healthcare provider if changes in diet or medicines are needed if you have diabetes. What side effects may I notice from receiving this medication? Side effects that you should report to your doctor or health care professional as soon as  possible: allergic reactions like skin rash, itching or hives, swelling of the face, lips, or tongue bloody or black, tarry stools changes in emotions or moods changes in vision confusion, excitement, restlessness depressed mood eye pain hallucinations muscle weakness severe or sudden stomach or belly pain signs and symptoms of high blood sugar such as being more thirsty or hungry or having to urinate more than normal. You may also feel very tired or have blurry vision. signs and symptoms of infection like fever; chills; cough; sore throat; pain or trouble passing urine swelling of ankles, feet unusual bruising or bleeding wounds that do not heal Side effects that usually do not require medical attention (report to your doctor or health care professional if they continue or are bothersome): increased appetite increased growth of face or body hair headache nausea, vomiting pain, redness, or irritation at site where injected skin problems, acne, thin and shiny skin trouble sleeping weight gain This list may not describe all possible side effects. Call your doctor for medical advice about side effects. You may report side effects to FDA at 1-800-FDA-1088. Where should I keep my medication? This medicine is given in a hospital or clinic and will not be stored at home. NOTE: This sheet is a summary. It may not cover all possible information. If you have questions about this medicine, talk to your doctor, pharmacist, or health care provider.  2022 Elsevier/Gold Standard (2019-06-04 00:00:00)  Prochlorperazine tablets What is this medication? PROCHLORPERAZINE (proe klor PER a zeen) helps to control severe nausea and vomiting. This medicine is also used to treat schizophrenia. It can also help patients who experience anxiety that is not due to psychological illness. This medicine may be used for other purposes; ask your health care provider or pharmacist if you have questions. COMMON  BRAND NAME(S): Compazine What should I tell my care team before I take this medication? They need to know if you have any of these conditions: blockage in your bowel brain tumor dementia diabetes difficulty swallowing glaucoma have trouble controlling your muscles head injury heart disease history of irregular heartbeat if you often drink alcohol liver disease low blood counts, like low Lyndell Allaire cell, platelet, or red cell counts low blood pressure lung or breathing disease, like asthma Parkinson's disease prostate disease seizures trouble passing urine an unusual or allergic reaction to prochlorperazine, other medicines, foods, dyes, or preservatives pregnant or trying to get pregnant breast-feeding How should I use this medication? Take this medicine by mouth with a glass of water. Follow the directions on the prescription label. Take your doses at regular intervals. Do not take your medicine more often than directed. Do not stop taking this medicine suddenly. This can cause nausea, vomiting, and dizziness. Ask your doctor or health care professional for advice. Talk to your pediatrician regarding the use of this medicine in children. Special care may be needed. While this drug may be prescribed for children as young as 2 years for selected conditions, precautions do apply. Overdosage: If you think you have taken too much of this medicine contact a poison control center or emergency room at once. NOTE: This medicine is only for you. Do not share this medicine with others. What if I miss a dose? If you miss a  dose, take it as soon as you can. If it is almost time for your next dose, take only that dose. Do not take double or extra doses. What may interact with this medication? Do not take this medicine with any of the following medications: cisapride dofetilide dronedarone metoclopramide pimozide saquinavir thioridazine This medicine may also interact with the following  medications: alcohol antihistamines for allergy, cough, and cold atropine certain medicines for anxiety or sleep certain medicines for bladder problems like oxybutynin, tolterodine certain medicines for depression like amitriptyline, fluoxetine, sertraline certain medicines for stomach problems like dicyclomine, hyoscyamine certain medicines for travel sickness like scopolamine epinephrine general anesthetics like halothane, isoflurane, methoxyflurane, propofol ipratropium levodopa or other medicines for Parkinson's disease lithium medicines for blood pressure medicines for seizures like phenobarbital, primidone, phenytoin medicines that relax muscles for surgery narcotic medicines for pain propranolol warfarin This list may not describe all possible interactions. Give your health care provider a list of all the medicines, herbs, non-prescription drugs, or dietary supplements you use. Also tell them if you smoke, drink alcohol, or use illegal drugs. Some items may interact with your medicine. What should I watch for while using this medication? Visit your health care professional for regular checks on your progress. Tell your health care professional if symptoms do not start to get better or if they get worse. You may get drowsy or dizzy. Do not drive, use machinery, or do anything that needs mental alertness until you know how this medicine affects you. Do not stand or sit up quickly, especially if you are an older patient. This reduces the risk of dizzy or fainting spells. Alcohol may interfere with the effect of this medicine. Avoid alcoholic drinks. This drug can cause problems with controlling your body temperature. It can lower the response of your body to cold temperatures. If possible, stay indoors during cold weather. If you must go outdoors, wear warm clothes. It can also lower the response of your body to heat. Do not overheat. Do not over-exercise. Stay out of the sun when  possible. If you must be in the sun, wear cool clothing. Drink plenty of water. If you have trouble controlling your body temperature, call your health care provider right away. This medicine may increase blood sugar. Ask your health care provider if changes in diet or medicines are needed if you have diabetes. This medicine can make you more sensitive to the sun. Keep out of the sun. If you cannot avoid being in the sun, wear protective clothing and use sunscreen. Do not use sun lamps or tanning beds/booths. Your mouth may get dry. Chewing sugarless gum or sucking hard candy, and drinking plenty of water may help. Contact your doctor if the problem does not go away or is severe. What side effects may I notice from receiving this medication? Side effects that you should report to your doctor or health care professional as soon as possible: allergic reactions like skin rash, itching or hives, swelling of the face, lips, or tongue abnormal production of milk breast enlargement in both males and females changes in vision chest pain confusion fast, irregular heartbeat fever, chills, sore throat seizures signs and symptoms of high blood sugar such as being more thirsty or hungry or having to urinate more than normal. You may also feel very tired or have blurry vision. signs and symptoms of liver injury like dark yellow or brown urine; general ill feeling or flu-like symptoms; light-colored stools; loss of appetite; nausea; right upper belly pain;  unusually weak or tired; yellowing of the eyes or skin signs and symptoms of low blood pressure like dizziness; feeling faint or lightheaded, falls; unusually weak or tired trouble passing urine or change in the amount of urine trouble swallowing uncontrollable movements of the arms, face, head, mouth, neck, or upper body unusual bruising or bleeding unusually weak or tired Side effects that usually do not require medical attention (report to your doctor  or health care professional if they continue or are bothersome): constipation drowsiness dry mouth This list may not describe all possible side effects. Call your doctor for medical advice about side effects. You may report side effects to FDA at 1-800-FDA-1088. Where should I keep my medication? Keep out of the reach of children. Store at room temperature between 15 and 30 degrees C (59 and 86 degrees F). Protect from light. Throw away any unused medicine after the expiration date. NOTE: This sheet is a summary. It may not cover all possible information. If you have questions about this medicine, talk to your doctor, pharmacist, or health care provider.  2022 Elsevier/Gold Standard (2021-08-08 00:00:00)  Pemetrexed injection What is this medication? PEMETREXED (PEM e TREX ed) is a chemotherapy drug used to treat lung cancers like non-small cell lung cancer and mesothelioma. It may also be used to treat other cancers. This medicine may be used for other purposes; ask your health care provider or pharmacist if you have questions. COMMON BRAND NAME(S): Alimta, PEMFEXY What should I tell my care team before I take this medication? They need to know if you have any of these conditions: infection (especially a virus infection such as chickenpox, cold sores, or herpes) kidney disease low blood counts, like low Anastasya Jewell cell, platelet, or red cell counts lung or breathing disease, like asthma radiation therapy an unusual or allergic reaction to pemetrexed, other medicines, foods, dyes, or preservative pregnant or trying to get pregnant breast-feeding How should I use this medication? This drug is given as an infusion into a vein. It is administered in a hospital or clinic by a specially trained health care professional. Talk to your pediatrician regarding the use of this medicine in children. Special care may be needed. Overdosage: If you think you have taken too much of this medicine contact a  poison control center or emergency room at once. NOTE: This medicine is only for you. Do not share this medicine with others. What if I miss a dose? It is important not to miss your dose. Call your doctor or health care professional if you are unable to keep an appointment. What may interact with this medication? This medicine may interact with the following medications: Ibuprofen This list may not describe all possible interactions. Give your health care provider a list of all the medicines, herbs, non-prescription drugs, or dietary supplements you use. Also tell them if you smoke, drink alcohol, or use illegal drugs. Some items may interact with your medicine. What should I watch for while using this medication? Visit your doctor for checks on your progress. This drug may make you feel generally unwell. This is not uncommon, as chemotherapy can affect healthy cells as well as cancer cells. Report any side effects. Continue your course of treatment even though you feel ill unless your doctor tells you to stop. In some cases, you may be given additional medicines to help with side effects. Follow all directions for their use. Call your doctor or health care professional for advice if you get a fever, chills or  sore throat, or other symptoms of a cold or flu. Do not treat yourself. This drug decreases your body's ability to fight infections. Try to avoid being around people who are sick. This medicine may increase your risk to bruise or bleed. Call your doctor or health care professional if you notice any unusual bleeding. Be careful brushing and flossing your teeth or using a toothpick because you may get an infection or bleed more easily. If you have any dental work done, tell your dentist you are receiving this medicine. Avoid taking products that contain aspirin, acetaminophen, ibuprofen, naproxen, or ketoprofen unless instructed by your doctor. These medicines may hide a fever. Call your doctor or  health care professional if you get diarrhea or mouth sores. Do not treat yourself. To protect your kidneys, drink water or other fluids as directed while you are taking this medicine. Do not become pregnant while taking this medicine or for 6 months after stopping it. Women should inform their doctor if they wish to become pregnant or think they might be pregnant. Men should not father a child while taking this medicine and for 3 months after stopping it. This may interfere with the ability to father a child. You should talk to your doctor or health care professional if you are concerned about your fertility. There is a potential for serious side effects to an unborn child. Talk to your health care professional or pharmacist for more information. Do not breast-feed an infant while taking this medicine or for 1 week after stopping it. What side effects may I notice from receiving this medication? Side effects that you should report to your doctor or health care professional as soon as possible: allergic reactions like skin rash, itching or hives, swelling of the face, lips, or tongue breathing problems redness, blistering, peeling or loosening of the skin, including inside the mouth signs and symptoms of bleeding such as bloody or black, tarry stools; red or dark-brown urine; spitting up blood or brown material that looks like coffee grounds; red spots on the skin; unusual bruising or bleeding from the eye, gums, or nose signs and symptoms of infection like fever or chills; cough; sore throat; pain or trouble passing urine signs and symptoms of kidney injury like trouble passing urine or change in the amount of urine signs and symptoms of liver injury like dark yellow or brown urine; general ill feeling or flu-like symptoms; light-colored stools; loss of appetite; nausea; right upper belly pain; unusually weak or tired; yellowing of the eyes or skin Side effects that usually do not require medical  attention (report to your doctor or health care professional if they continue or are bothersome): constipation mouth sores nausea, vomiting unusually weak or tired This list may not describe all possible side effects. Call your doctor for medical advice about side effects. You may report side effects to FDA at 1-800-FDA-1088. Where should I keep my medication? This drug is given in a hospital or clinic and will not be stored at home. NOTE: This sheet is a summary. It may not cover all possible information. If you have questions about this medicine, talk to your doctor, pharmacist, or health care provider.  2022 Elsevier/Gold Standard (2018-01-14 00:00:00)

## 2021-10-20 NOTE — Progress Notes (Signed)
Patient denies new problems/concerns today.   °

## 2021-10-20 NOTE — Progress Notes (Signed)
Pt received alimta infusion in clinic today. Tolerated well. No complaints at d/c.

## 2021-11-10 ENCOUNTER — Other Ambulatory Visit: Payer: Self-pay

## 2021-11-10 ENCOUNTER — Inpatient Hospital Stay: Payer: BC Managed Care – PPO

## 2021-11-10 ENCOUNTER — Inpatient Hospital Stay (HOSPITAL_BASED_OUTPATIENT_CLINIC_OR_DEPARTMENT_OTHER): Payer: BC Managed Care – PPO | Admitting: Internal Medicine

## 2021-11-10 ENCOUNTER — Inpatient Hospital Stay: Payer: BC Managed Care – PPO | Attending: Internal Medicine

## 2021-11-10 ENCOUNTER — Encounter: Payer: Self-pay | Admitting: Internal Medicine

## 2021-11-10 DIAGNOSIS — Z8679 Personal history of other diseases of the circulatory system: Secondary | ICD-10-CM | POA: Insufficient documentation

## 2021-11-10 DIAGNOSIS — I712 Thoracic aortic aneurysm, without rupture, unspecified: Secondary | ICD-10-CM | POA: Diagnosis not present

## 2021-11-10 DIAGNOSIS — Z5111 Encounter for antineoplastic chemotherapy: Secondary | ICD-10-CM | POA: Insufficient documentation

## 2021-11-10 DIAGNOSIS — C3402 Malignant neoplasm of left main bronchus: Secondary | ICD-10-CM

## 2021-11-10 DIAGNOSIS — M7072 Other bursitis of hip, left hip: Secondary | ICD-10-CM | POA: Diagnosis not present

## 2021-11-10 DIAGNOSIS — Z806 Family history of leukemia: Secondary | ICD-10-CM | POA: Diagnosis not present

## 2021-11-10 LAB — COMPREHENSIVE METABOLIC PANEL
ALT: 19 U/L (ref 0–44)
AST: 24 U/L (ref 15–41)
Albumin: 4 g/dL (ref 3.5–5.0)
Alkaline Phosphatase: 50 U/L (ref 38–126)
Anion gap: 9 (ref 5–15)
BUN: 24 mg/dL — ABNORMAL HIGH (ref 6–20)
CO2: 26 mmol/L (ref 22–32)
Calcium: 9 mg/dL (ref 8.9–10.3)
Chloride: 99 mmol/L (ref 98–111)
Creatinine, Ser: 1.32 mg/dL — ABNORMAL HIGH (ref 0.61–1.24)
GFR, Estimated: >60 mL/min (ref >60)
Glucose, Bld: 159 mg/dL — ABNORMAL HIGH (ref 70–99)
Potassium: 3.8 mmol/L (ref 3.5–5.1)
Sodium: 134 mmol/L — ABNORMAL LOW (ref 135–145)
Total Bilirubin: 0.8 mg/dL (ref 0.3–1.2)
Total Protein: 7.3 g/dL (ref 6.5–8.1)

## 2021-11-10 LAB — CBC WITH DIFFERENTIAL/PLATELET
Abs Immature Granulocytes: 0.02 10*3/uL (ref 0.00–0.07)
Basophils Absolute: 0.1 10*3/uL (ref 0.0–0.1)
Basophils Relative: 1 %
Eosinophils Absolute: 0.2 10*3/uL (ref 0.0–0.5)
Eosinophils Relative: 4 %
HCT: 40.2 % (ref 39.0–52.0)
Hemoglobin: 14.1 g/dL (ref 13.0–17.0)
Immature Granulocytes: 0 %
Lymphocytes Relative: 38 %
Lymphs Abs: 2 10*3/uL (ref 0.7–4.0)
MCH: 33 pg (ref 26.0–34.0)
MCHC: 35.1 g/dL (ref 30.0–36.0)
MCV: 94.1 fL (ref 80.0–100.0)
Monocytes Absolute: 0.6 10*3/uL (ref 0.1–1.0)
Monocytes Relative: 11 %
Neutro Abs: 2.4 10*3/uL (ref 1.7–7.7)
Neutrophils Relative %: 46 %
Platelets: 164 10*3/uL (ref 150–400)
RBC: 4.27 MIL/uL (ref 4.22–5.81)
RDW: 13.3 % (ref 11.5–15.5)
WBC: 5.2 10*3/uL (ref 4.0–10.5)
nRBC: 0 % (ref 0.0–0.2)

## 2021-11-10 MED ORDER — SODIUM CHLORIDE 0.9 % IV SOLN
Freq: Once | INTRAVENOUS | Status: AC
Start: 2021-11-10 — End: 2021-11-10
  Filled 2021-11-10: qty 250

## 2021-11-10 MED ORDER — PROCHLORPERAZINE MALEATE 10 MG PO TABS
10.0000 mg | ORAL_TABLET | Freq: Once | ORAL | Status: AC
  Administered 2021-11-10: 10 mg via ORAL
  Filled 2021-11-10: qty 1

## 2021-11-10 MED ORDER — SODIUM CHLORIDE 0.9 % IV SOLN
500.0000 mg/m2 | Freq: Once | INTRAVENOUS | Status: AC
  Administered 2021-11-10: 1100 mg via INTRAVENOUS
  Filled 2021-11-10: qty 40

## 2021-11-10 MED ORDER — HEPARIN SOD (PORK) LOCK FLUSH 100 UNIT/ML IV SOLN
500.0000 [IU] | Freq: Once | INTRAVENOUS | Status: AC | PRN
  Filled 2021-11-10: qty 5

## 2021-11-10 MED ORDER — HEPARIN SOD (PORK) LOCK FLUSH 100 UNIT/ML IV SOLN
INTRAVENOUS | Status: AC
  Administered 2021-11-10: 500 [IU]
  Filled 2021-11-10: qty 5

## 2021-11-10 MED ORDER — DEXAMETHASONE SODIUM PHOSPHATE 10 MG/ML IJ SOLN
6.0000 mg | Freq: Once | INTRAMUSCULAR | Status: AC
  Administered 2021-11-10: 6 mg via INTRAVENOUS
  Filled 2021-11-10: qty 1

## 2021-11-10 NOTE — Progress Notes (Signed)
Pt doing good but still has bi-lateral leg edema.

## 2021-11-10 NOTE — Progress Notes (Signed)
[facility] OFFICE PROGRESS NOTE  Patient Care Team: [name], MD as PCP - General (Family Medicine) [name], MD as Consulting Physician (Internal Medicine)   Cancer Staging  No matching staging information was found for the patient.   Oncology History Overview Note  # 2012- METASTATIC ADENO CA of LEFT LUNG [acinar pattern] s/p MED LN Bx [NEG- EGFR/K-ras/ALK mutation];[name]; [name]; CT JULY  2016- NED; JAN 2018- NED;   # AUG 2020-clinical trial closed; continue maintenance Alimta  # Thoracic aortic aneurysm-4.0 cm in 2016; currently 4.6 cm.  s/p referral to Dr. Faith Rogue.; MRI in 6 months planned.  ----------------------------------------------------------    DIAGNOSIS: [name]  STAGE: 4       ;GOALS: Palliative  CURRENT/MOST RECENT THERAPY '[ ]'  Alimta maintenance    Cancer of hilus of left lung ([place])  07/20/2016 Initial Diagnosis   Cancer of hilus of left lung ([place])   12/11/2019 -  Chemotherapy   Patient is on Treatment Plan : LUNG Pemetrexed (Alimta) q21d       INTERVAL HISTORY:  Mitchell Mosley 60 y.o.  male pleasant patient above history of metastatic adenocarcinoma the lung-NED currently on maintenance Alimta is here for follow-up.  Patient denies any new onset of headache or shortness of breath or cough.  No nausea no vomiting no fever no chills.  Review of Systems  Constitutional:  Negative for chills, diaphoresis, fever, malaise/fatigue and weight loss.  HENT:  Negative for nosebleeds and sore throat.   Eyes:  Negative for double vision.  Respiratory:  Negative for hemoptysis, shortness of breath and wheezing.   Cardiovascular:  Positive for leg swelling. Negative for chest pain, palpitations and orthopnea.  Gastrointestinal:  Negative for abdominal pain, blood in stool, constipation, diarrhea, heartburn,  melena, nausea and vomiting.  Genitourinary:  Negative for dysuria, frequency and urgency.  Musculoskeletal:  Positive for joint pain.  Skin: Negative.  Negative for itching and rash.  Neurological:  Negative for dizziness, tingling, focal weakness, weakness and headaches.  Endo/Heme/Allergies:  Does not bruise/bleed easily.  Psychiatric/Behavioral:  Negative for depression. The patient is not nervous/anxious and does not have insomnia.      PAST MEDICAL HISTORY :  Past Medical History:  Diagnosis Date  . Allergy   . Lung cancer ([place])   . Mild valvular heart disease Nov. 2014   per 2 D Echocardiogram done for persistent leg edema, monitored by cardiology  . Peripheral edema   . Thoracic aortic aneurysm (TAA)     PAST SURGICAL HISTORY :   Past Surgical History:  Procedure Laterality Date  . ELBOW SURGERY Right 2011  . IR CV LINE INJECTION  09/11/2021  . Left anterior thoracotomy with biopsy  March 2012  . PORTACATH PLACEMENT  March 2012  . TONSILLECTOMY  as child    FAMILY HISTORY :   Family History  Problem Relation Age of Onset  . Leukemia Father   . Diabetes Mother     SOCIAL HISTORY:   Social History   Tobacco Use  . Smoking status: Never  . Smokeless tobacco: Never  Vaping Use  . Vaping Use: Never used  Substance Use Topics  . Alcohol use: Yes    Alcohol/week: 0.0 standard drinks    Comment: "social drinker"   . Drug use: No    ALLERGIES:  is allergic to penicillins.  MEDICATIONS:  Current Outpatient Medications  Medication Sig Dispense Refill  . folic acid (FOLVITE) 1 MG tablet TAKE 1 TABLET BY MOUTH EVERY DAY 90 tablet 3  . furosemide (LASIX) 20 MG tablet TAKE 1 TABLET (20 MG TOTAL) BY MOUTH DAILY AS NEEDED FOR SWELLING 90 tablet 1  . meloxicam (MOBIC) 15 MG tablet Take 15 mg by mouth daily.    . Multiple Vitamin (MULTIVITAMIN) capsule Take 1 capsule by mouth daily. Reported on 02/24/2016    . NON FORMULARY Relief Factor with vitamin D and Turmeric     . Omega-3 Fatty Acids (FISH OIL) 1000 MG CAPS Take 1 capsule by mouth daily. Reported on 02/24/2016    . senna (SENOKOT) 8.6 MG tablet Take 1 tablet by mouth daily. Take as needed around time of chemotherapy    . sildenafil (VIAGRA) 100 MG tablet TAKE 1 TABLET BY MOUTH EVERY DAY AS NEEDED FOR ERECTILE DYSFUNCTION 20 tablet 11  . spironolactone (ALDACTONE) 25 MG tablet TAKE 1 TABLET BY MOUTH EVERY DAY 90 tablet 3  . tadalafil (CIALIS) 20 MG tablet Take one tablet every 2 days as needed 50 tablet 5  . vitamin B-12 (CYANOCOBALAMIN) 1000 MCG tablet Take 1,000 mcg by mouth daily.    Marland Kitchen HYDROcodone-acetaminophen (NORCO) 5-325 MG tablet Take 1 tablet by mouth every 6 (six) hours as needed for moderate pain. (Patient not taking: Reported on 10/20/2021) 30 tablet 0   No current facility-administered medications for this visit.   Facility-Administered Medications Ordered in Other Visits  Medication Dose Route Frequency Provider Last Rate Last Admin  . heparin lock flush 100 UNIT/ML injection           . heparin lock flush 100 unit/mL  500 Units Intravenous Once Charlaine Dalton R, MD      . heparin lock flush 100 unit/mL  500 Units Intracatheter Once PRN [name], MD      . PEMEtrexed (ALIMTA) 1,100 mg in sodium chloride 0.9 % 100 mL chemo infusion  500 mg/m2 (Treatment Plan Recorded) Intravenous Once Charlaine Dalton R, [name], MD   10 mL at 05/27/15 1543  . sodium chloride 0.9 % injection 10 mL  10 mL Intracatheter PRN [name], MD   10 mL at 06/17/15 1510  . sodium chloride 0.9 % injection 10 mL  10 mL Intravenous PRN Choksi, Delorise Shiner, MD   10 mL at 07/08/15 1340  . sodium chloride flush (NS) 0.9 % injection 10 mL  10 mL Intravenous PRN Verlon Au, NP   10 mL at 09/30/20 1412    PHYSICAL EXAMINATION: ECOG PERFORMANCE STATUS: 0 - Asymptomatic  BP 131/85 (BP Location: Left Arm, Patient  Position: Sitting, Cuff Size: Large)   Pulse 88   Temp 98 F (36.7 C) (Oral)   Wt 211 lb 12.8 oz (96.1 kg)   BMI 29.96 kg/m   Filed Weights   11/10/21 1447  Weight: 211 lb 12.8 oz (96.1 kg)    Physical Exam HENT:     Head: Normocephalic and atraumatic.     Mouth/Throat:     Pharynx: No oropharyngeal exudate.  Eyes:     Pupils: Pupils are equal, round, and reactive to light.  Cardiovascular:     Rate and Rhythm: Normal rate and regular rhythm.  Pulmonary:     Effort: No respiratory distress.     Breath sounds: No wheezing.  Abdominal:     General:  Bowel sounds are normal. There is no distension.     Palpations: Abdomen is soft. There is no mass.     Tenderness: There is no abdominal tenderness. There is no guarding or rebound.  Musculoskeletal:        General: No tenderness. Normal range of motion.     Cervical back: Normal range of motion and neck supple.     Comments: Grade 1 swelling in the legs bilaterally.  Skin:    General: Skin is warm.  Neurological:     Mental Status: He is alert and oriented to person, place, and time.  Psychiatric:        Mood and Affect: Affect normal.     LABORATORY DATA:  I have reviewed the data as listed    Component Value Date/Time   NA 134 (L) 11/10/2021 1420   NA 137 06/19/2021 1121   NA 138 12/10/2014 1341   K 3.8 11/10/2021 1420   K 3.6 12/10/2014 1341   CL 99 11/10/2021 1420   CL 102 12/10/2014 1341   CO2 26 11/10/2021 1420   CO2 30 12/10/2014 1341   GLUCOSE 159 (H) 11/10/2021 1420   GLUCOSE 160 (H) 12/10/2014 1341   BUN 24 (H) 11/10/2021 1420   BUN 18 06/19/2021 1121   BUN 21 (H) 12/10/2014 1341   CREATININE 1.32 (H) 11/10/2021 1420   CREATININE 1.10 03/25/2015 1453   CALCIUM 9.0 11/10/2021 1420   CALCIUM 8.4 (L) 12/10/2014 1341   PROT 7.3 11/10/2021 1420   PROT 7.3 06/19/2021 1121   PROT 6.6 12/10/2014 1341   ALBUMIN 4.0 11/10/2021 1420   ALBUMIN 4.5 06/19/2021 1121   ALBUMIN 3.4 12/10/2014 1341   AST 24  11/10/2021 1420   AST 17 12/10/2014 1341   ALT 19 11/10/2021 1420   ALT 23 12/10/2014 1341   ALKPHOS 50 11/10/2021 1420   ALKPHOS 50 12/10/2014 1341   BILITOT 0.8 11/10/2021 1420   BILITOT 0.2 06/19/2021 1121   BILITOT 0.5 12/10/2014 1341   GFRNONAA >60 11/10/2021 1420   GFRNONAA >60 03/25/2015 1453   GFRAA >60 08/19/2020 1324   GFRAA >60 03/25/2015 1453    No results found for: SPEP, UPEP  Lab Results  Component Value Date   WBC 5.2 11/10/2021   NEUTROABS 2.4 11/10/2021   HGB 14.1 11/10/2021   HCT 40.2 11/10/2021   MCV 94.1 11/10/2021   PLT 164 11/10/2021      Chemistry      Component Value Date/Time   NA 134 (L) 11/10/2021 1420   NA 137 06/19/2021 1121   NA 138 12/10/2014 1341   K 3.8 11/10/2021 1420   K 3.6 12/10/2014 1341   CL 99 11/10/2021 1420   CL 102 12/10/2014 1341   CO2 26 11/10/2021 1420   CO2 30 12/10/2014 1341   BUN 24 (H) 11/10/2021 1420   BUN 18 06/19/2021 1121   BUN 21 (H) 12/10/2014 1341   CREATININE 1.32 (H) 11/10/2021 1420   CREATININE 1.10 03/25/2015 1453      Component Value Date/Time   CALCIUM 9.0 11/10/2021 1420   CALCIUM 8.4 (L) 12/10/2014 1341   ALKPHOS 50 11/10/2021 1420   ALKPHOS 50 12/10/2014 1341   AST 24 11/10/2021 1420   AST 17 12/10/2014 1341   ALT 19 11/10/2021 1420   ALT 23 12/10/2014 1341   BILITOT 0.8 11/10/2021 1420   BILITOT 0.2 06/19/2021 1121   BILITOT 0.5 12/10/2014 1341       RADIOGRAPHIC STUDIES: I  have personally reviewed the radiological images as listed and agreed with the findings in the report. No results found.   ASSESSMENT & PLAN:  Cancer of hilus of left lung ([place]) # Adenocarcinoma the lung; stage IV;  SEP 2022- CT No evidence of recurrent or metastatic carcinoma within the chest, abdomen, or pelvis. STABLE  # Proceed with Alimta chemotherapy. Labs today reviewed;  acceptable for treatment today; B12 every third cycle cycle.     # MSK- left hip bursitis s/p injection- Swelling in the  hands/feet-grade 1 asymptomatic.? Alimta; monitor-STABLE.   #Thoracic aneurysm-approximately 4.5 cm in size; on surveillance-stable. SEP 12th CT scan-STABLE.   # port malfunction: s/p TPA- functioning well.   # DISPOSITION:  # Alimta today;  # Follow up in 3 weeks MD-labs- cbc/cmp; alimta; b12/-Dr.B   No orders of the defined types were placed in this encounter.   All questions were answered. The patient knows to call the clinic with any problems, questions or concerns.      [name], MD 11/10/2021 3:35 PM

## 2021-11-10 NOTE — Assessment & Plan Note (Addendum)
#   Adenocarcinoma the lung; stage IV;  SEP 2022- CT No evidence of recurrent or metastatic carcinoma within the chest, abdomen, or pelvis. STABLE  # Proceed with Alimta chemotherapy. Labs today reviewed;  acceptable for treatment today; B12 every third cycle cycle.     # MSK- left hip bursitis s/p injection- Swelling in the hands/feet-grade 1 asymptomatic.? Alimta; monitor-STABLE.   #Thoracic aneurysm-approximately 4.5 cm in size; on surveillance-stable. SEP 12th CT scan-STABLE.   # port malfunction: s/p TPA- functioning well.   # DISPOSITION:  # Alimta today;  # Follow up in 3 weeks MD-labs- cbc/cmp; alimta; b12/-Dr.B

## 2021-11-17 ENCOUNTER — Other Ambulatory Visit: Payer: Self-pay | Admitting: Internal Medicine

## 2021-11-17 DIAGNOSIS — R609 Edema, unspecified: Secondary | ICD-10-CM

## 2021-11-17 DIAGNOSIS — C33 Malignant neoplasm of trachea: Secondary | ICD-10-CM

## 2021-11-17 DIAGNOSIS — C3480 Malignant neoplasm of overlapping sites of unspecified bronchus and lung: Secondary | ICD-10-CM

## 2021-11-30 ENCOUNTER — Encounter: Payer: Self-pay | Admitting: Internal Medicine

## 2021-12-01 ENCOUNTER — Inpatient Hospital Stay: Payer: BC Managed Care – PPO | Admitting: Internal Medicine

## 2021-12-01 ENCOUNTER — Inpatient Hospital Stay: Payer: BC Managed Care – PPO

## 2021-12-22 ENCOUNTER — Inpatient Hospital Stay (HOSPITAL_BASED_OUTPATIENT_CLINIC_OR_DEPARTMENT_OTHER): Payer: BC Managed Care – PPO | Admitting: Internal Medicine

## 2021-12-22 ENCOUNTER — Other Ambulatory Visit: Payer: Self-pay

## 2021-12-22 ENCOUNTER — Encounter: Payer: Self-pay | Admitting: Internal Medicine

## 2021-12-22 ENCOUNTER — Inpatient Hospital Stay: Payer: BC Managed Care – PPO

## 2021-12-22 ENCOUNTER — Inpatient Hospital Stay: Payer: BC Managed Care – PPO | Attending: Internal Medicine

## 2021-12-22 VITALS — BP 135/83 | HR 85 | Temp 97.5°F | Ht 70.5 in | Wt 212.4 lb

## 2021-12-22 DIAGNOSIS — C3402 Malignant neoplasm of left main bronchus: Secondary | ICD-10-CM

## 2021-12-22 DIAGNOSIS — I712 Thoracic aortic aneurysm, without rupture, unspecified: Secondary | ICD-10-CM | POA: Diagnosis not present

## 2021-12-22 DIAGNOSIS — Z806 Family history of leukemia: Secondary | ICD-10-CM | POA: Diagnosis not present

## 2021-12-22 DIAGNOSIS — R7989 Other specified abnormal findings of blood chemistry: Secondary | ICD-10-CM | POA: Diagnosis not present

## 2021-12-22 DIAGNOSIS — M7072 Other bursitis of hip, left hip: Secondary | ICD-10-CM | POA: Diagnosis not present

## 2021-12-22 DIAGNOSIS — Z8679 Personal history of other diseases of the circulatory system: Secondary | ICD-10-CM | POA: Insufficient documentation

## 2021-12-22 LAB — URINALYSIS, COMPLETE (UACMP) WITH MICROSCOPIC
Bacteria, UA: NONE SEEN
Bilirubin Urine: NEGATIVE
Glucose, UA: NEGATIVE mg/dL
Hgb urine dipstick: NEGATIVE
Ketones, ur: 5 mg/dL — AB
Leukocytes,Ua: NEGATIVE
Nitrite: NEGATIVE
Protein, ur: NEGATIVE mg/dL
Specific Gravity, Urine: 1.023 (ref 1.005–1.030)
WBC, UA: NONE SEEN WBC/hpf (ref 0–5)
pH: 5 (ref 5.0–8.0)

## 2021-12-22 LAB — COMPREHENSIVE METABOLIC PANEL
ALT: 16 U/L (ref 0–44)
AST: 23 U/L (ref 15–41)
Albumin: 4.2 g/dL (ref 3.5–5.0)
Alkaline Phosphatase: 44 U/L (ref 38–126)
Anion gap: 8 (ref 5–15)
BUN: 26 mg/dL — ABNORMAL HIGH (ref 6–20)
CO2: 25 mmol/L (ref 22–32)
Calcium: 9 mg/dL (ref 8.9–10.3)
Chloride: 103 mmol/L (ref 98–111)
Creatinine, Ser: 1.68 mg/dL — ABNORMAL HIGH (ref 0.61–1.24)
GFR, Estimated: 47 mL/min — ABNORMAL LOW (ref >60)
Glucose, Bld: 147 mg/dL — ABNORMAL HIGH (ref 70–99)
Potassium: 3.7 mmol/L (ref 3.5–5.1)
Sodium: 136 mmol/L (ref 135–145)
Total Bilirubin: 0.7 mg/dL (ref 0.3–1.2)
Total Protein: 7.2 g/dL (ref 6.5–8.1)

## 2021-12-22 LAB — CBC WITH DIFFERENTIAL/PLATELET
Abs Immature Granulocytes: 0.01 10*3/uL (ref 0.00–0.07)
Basophils Absolute: 0.1 10*3/uL (ref 0.0–0.1)
Basophils Relative: 1 %
Eosinophils Absolute: 0.1 10*3/uL (ref 0.0–0.5)
Eosinophils Relative: 2 %
HCT: 41.4 % (ref 39.0–52.0)
Hemoglobin: 14.4 g/dL (ref 13.0–17.0)
Immature Granulocytes: 0 %
Lymphocytes Relative: 37 %
Lymphs Abs: 2 10*3/uL (ref 0.7–4.0)
MCH: 32.7 pg (ref 26.0–34.0)
MCHC: 34.8 g/dL (ref 30.0–36.0)
MCV: 94.1 fL (ref 80.0–100.0)
Monocytes Absolute: 0.4 10*3/uL (ref 0.1–1.0)
Monocytes Relative: 7 %
Neutro Abs: 2.8 10*3/uL (ref 1.7–7.7)
Neutrophils Relative %: 53 %
Platelets: 145 10*3/uL — ABNORMAL LOW (ref 150–400)
RBC: 4.4 MIL/uL (ref 4.22–5.81)
RDW: 12 % (ref 11.5–15.5)
WBC: 5.4 10*3/uL (ref 4.0–10.5)
nRBC: 0 % (ref 0.0–0.2)

## 2021-12-22 LAB — PROTEIN / CREATININE RATIO, URINE
Creatinine, Urine: 216 mg/dL
Protein Creatinine Ratio: 0.03 mg/mg{Cre} (ref 0.00–0.15)
Total Protein, Urine: 7 mg/dL

## 2021-12-22 MED ORDER — HEPARIN SOD (PORK) LOCK FLUSH 10 UNIT/ML IV SOLN
10.0000 [IU] | Freq: Once | INTRAVENOUS | Status: AC
  Administered 2021-12-22: 10 [IU]
  Filled 2021-12-22: qty 1

## 2021-12-22 NOTE — Assessment & Plan Note (Addendum)
#   Adenocarcinoma the lung; stage IV;  SEP 2022- CT No evidence of recurrent or metastatic carcinoma within the chest, abdomen, or pelvis. STABLE  # HOLD Alimta chemotherapy. Labs today reviewed;  UNacceptable for treatment today- given rise in creatinine; B12 every third cycle cycle.    # increase in creatinie- 1.6/GFR 47; BL 1.0- HOLD alimta today; the etiology is unclear-check UA protein; recommend HOLDING off Mobic [15 mg since bursotis- summer 2022;Dr.Howard].  Do not suspect any renal obstruction.  # MSK- left hip bursitis s/p injection- Swelling in the hands/feet-grade 1 asymptomatic.? Alimta; monitor-STABLE.  Recommend Tylenol as needed; hold off Mobic.HOLD lasix for now- see above.   #Thoracic aneurysm-approximately 4.5 cm in size; on surveillance-stable. SEP 12th CT scan-STABLE.   # port malfunction: s/p TPA- functioning well.   # DISPOSITION: de-access # HOLD Alimta today.; # UA today; urine proetin- ordered  # 1 week- bmp;  # Follow up in 3 weeks MD-labs- cbc/cmp; alimta; b12/-Dr.BB

## 2021-12-22 NOTE — Progress Notes (Signed)
Mitchell Mosley  Patient Care Team: [name], MD as PCP - General (Family Medicine) [name], MD as Consulting Physician (Internal Medicine)   Cancer Staging  No matching staging information was found for the patient.   Oncology History Overview [facility]  # 2012- METASTATIC ADENO CA of LEFT LUNG [acinar pattern] s/p MED LN Bx [NEG- EGFR/K-ras/ALK mutation];[name]; [name]; CT JULY  2016- NED; JAN 2018- NED;   # AUG 2020-clinical trial closed; continue maintenance Alimta  # Thoracic aortic aneurysm-4.0 cm in 2016; currently 4.6 cm.  s/p referral to Dr. Faith Rogue.; MRI in 6 months planned.  ----------------------------------------------------------    DIAGNOSIS: Mitchell.Mosley ] Adenocarcinoma lung  STAGE: 4       ;GOALS: Palliative  CURRENT/MOST RECENT THERAPY [ ] Alimta maintenance    Cancer of hilus of left lung ([place])  07/20/2016 Initial Diagnosis   Cancer of hilus of left lung ([place])   12/11/2019 -  Chemotherapy   Patient is on Treatment Plan : LUNG Pemetrexed (Alimta) q21d       INTERVAL HISTORY:  Mitchell Mosley 61 y.o.  male pleasant patient above history of metastatic adenocarcinoma the lung-NED currently on maintenance Alimta is here for follow-up.  Denies any worsening leg swelling any nausea vomiting.   Denies any worsening chest pain or shortness of breath cough.  Patient taking Mobic daily-for history of arthritic pain/bursitis.  Pain is currently controlled.  Denies any changes in his Lasix or any other NSAIDs.  Review of Systems  Constitutional:  Negative for chills, diaphoresis, fever, malaise/fatigue and weight loss.  HENT:  Negative for nosebleeds and sore throat.   Eyes:  Negative for double vision.  Respiratory:  Negative for hemoptysis, shortness of breath and wheezing.   Cardiovascular:  Positive for leg  swelling. Negative for chest pain, palpitations and orthopnea.  Gastrointestinal:  Negative for abdominal pain, blood in stool, constipation, diarrhea, heartburn, melena, nausea and vomiting.  Genitourinary:  Negative for dysuria, frequency and urgency.  Musculoskeletal:  Positive for joint pain.  Skin: Negative.  Negative for itching and rash.  Neurological:  Negative for dizziness, tingling, focal weakness, weakness and headaches.  Endo/Heme/Allergies:  Does not bruise/bleed easily.  Psychiatric/Behavioral:  Negative for depression. The patient is not nervous/anxious and does not have insomnia.      PAST MEDICAL HISTORY :  Past Medical History:  Diagnosis Date   Allergy    Lung cancer ([place])    Mild valvular heart disease Nov. 2014   per 2 D Echocardiogram done for persistent leg edema, monitored by cardiology   Peripheral edema    Thoracic aortic aneurysm (TAA)     PAST SURGICAL HISTORY :   Past Surgical History:  Procedure Laterality Date   ELBOW SURGERY Right 2011   IR CV LINE INJECTION  09/11/2021   Left anterior thoracotomy with biopsy  March 2012   PORTACATH PLACEMENT  March 2012   TONSILLECTOMY  as child    FAMILY HISTORY :   Family History  Problem Relation Age of Onset   Leukemia Father    Diabetes Mother     SOCIAL HISTORY:   Social History   Tobacco Use   Smoking status: Never   Smokeless tobacco: Never  Vaping Use   Vaping Use: Never used  Substance Use Topics   Alcohol use: Yes    Alcohol/week: 0.0 standard drinks    Comment: "  social drinker"    Drug use: No    ALLERGIES:  is allergic to penicillins.  MEDICATIONS:  Current Outpatient Medications  Medication Sig Dispense Refill   folic acid (FOLVITE) 1 MG tablet TAKE 1 TABLET BY MOUTH EVERY DAY 90 tablet 3   furosemide (LASIX) 20 MG tablet TAKE 1 TABLET (20 MG TOTAL) BY MOUTH DAILY AS NEEDED FOR SWELLING 90 tablet 1   HYDROcodone-acetaminophen (NORCO) 5-325 MG tablet Take 1 tablet by mouth  every 6 (six) hours as needed for moderate pain. 30 tablet 0   meloxicam (MOBIC) 15 MG tablet Take 15 mg by mouth daily.     Multiple Vitamin (MULTIVITAMIN) capsule Take 1 capsule by mouth daily. Reported on 02/24/2016     NON FORMULARY Relief Factor with vitamin D and Turmeric     Omega-3 Fatty Acids (FISH OIL) 1000 MG CAPS Take 1 capsule by mouth daily. Reported on 02/24/2016     senna (SENOKOT) 8.6 MG tablet Take 1 tablet by mouth daily. Take as needed around time of chemotherapy     sildenafil (VIAGRA) 100 MG tablet TAKE 1 TABLET BY MOUTH EVERY DAY AS NEEDED FOR ERECTILE DYSFUNCTION 20 tablet 11   spironolactone (ALDACTONE) 25 MG tablet TAKE 1 TABLET BY MOUTH EVERY DAY 90 tablet 3   tadalafil (CIALIS) 20 MG tablet Take one tablet every 2 days as needed 50 tablet 5   vitamin B-12 (CYANOCOBALAMIN) 1000 MCG tablet Take 1,000 mcg by mouth daily.     No current facility-administered medications for this visit.   Facility-Administered Medications Ordered in Other Visits  Medication Dose Route Frequency Provider Last Rate Last Admin   heparin lock flush 100 unit/mL  500 Units Intravenous Once Charlaine Dalton R, MD       sodium chloride 0.9 % injection 10 mL  10 mL Intracatheter PRN [name], MD   10 mL at 05/27/15 1543   sodium chloride 0.9 % injection 10 mL  10 mL Intracatheter PRN [name], MD   10 mL at 06/17/15 1510   sodium chloride 0.9 % injection 10 mL  10 mL Intravenous PRN [name], MD   10 mL at 07/08/15 1340   sodium chloride flush (NS) 0.9 % injection 10 mL  10 mL Intravenous PRN Verlon Au, NP   10 mL at 09/30/20 1412    PHYSICAL EXAMINATION: ECOG PERFORMANCE STATUS: 0 - Asymptomatic  BP 135/83 (BP Location: Left Arm, Patient Position: Sitting, Cuff Size: Normal)    Pulse 85    Temp (!) 97.5 F (36.4 C) (Tympanic)    Ht 5' 10.5" (1.791 m)    Wt 212 lb 6.4 oz (96.3 kg)    SpO2 100%    BMI 30.05 kg/m   Filed Weights   12/22/21 1423  Weight: 212 lb 6.4  oz (96.3 kg)    Physical Exam HENT:     Head: Normocephalic and atraumatic.     Mouth/Throat:     Pharynx: No oropharyngeal exudate.  Eyes:     Pupils: Pupils are equal, round, and reactive to light.  Cardiovascular:     Rate and Rhythm: Normal rate and regular rhythm.  Pulmonary:     Effort: No respiratory distress.     Breath sounds: No wheezing.  Abdominal:     General: Bowel sounds are normal. There is no distension.     Palpations: Abdomen is soft. There is no mass.     Tenderness: There is no abdominal tenderness. There is no  guarding or rebound.  Musculoskeletal:        General: No tenderness. Normal range of motion.     Cervical back: Normal range of motion and neck supple.     Comments: Grade 1 swelling in the legs bilaterally.  Skin:    General: Skin is warm.  Neurological:     Mental Status: He is alert and oriented to person, place, and time.  Psychiatric:        Mood and Affect: Affect normal.     LABORATORY DATA:  I have reviewed the data as listed    Component Value Date/Time   NA 136 12/22/2021 1402   NA 137 06/19/2021 1121   NA 138 12/10/2014 1341   K 3.7 12/22/2021 1402   K 3.6 12/10/2014 1341   CL 103 12/22/2021 1402   CL 102 12/10/2014 1341   CO2 25 12/22/2021 1402   CO2 30 12/10/2014 1341   GLUCOSE 147 (H) 12/22/2021 1402   GLUCOSE 160 (H) 12/10/2014 1341   BUN 26 (H) 12/22/2021 1402   BUN 18 06/19/2021 1121   BUN 21 (H) 12/10/2014 1341   CREATININE 1.68 (H) 12/22/2021 1402   CREATININE 1.10 03/25/2015 1453   CALCIUM 9.0 12/22/2021 1402   CALCIUM 8.4 (L) 12/10/2014 1341   PROT 7.2 12/22/2021 1402   PROT 7.3 06/19/2021 1121   PROT 6.6 12/10/2014 1341   ALBUMIN 4.2 12/22/2021 1402   ALBUMIN 4.5 06/19/2021 1121   ALBUMIN 3.4 12/10/2014 1341   AST 23 12/22/2021 1402   AST 17 12/10/2014 1341   ALT 16 12/22/2021 1402   ALT 23 12/10/2014 1341   ALKPHOS 44 12/22/2021 1402   ALKPHOS 50 12/10/2014 1341   BILITOT 0.7 12/22/2021 1402    BILITOT 0.2 06/19/2021 1121   BILITOT 0.5 12/10/2014 1341   GFRNONAA 47 (L) 12/22/2021 1402   GFRNONAA >60 03/25/2015 1453   GFRAA >60 08/19/2020 1324   GFRAA >60 03/25/2015 1453    No results found for: SPEP, UPEP  Lab Results  Component Value Date   WBC 5.4 12/22/2021   NEUTROABS 2.8 12/22/2021   HGB 14.4 12/22/2021   HCT 41.4 12/22/2021   MCV 94.1 12/22/2021   PLT 145 (L) 12/22/2021      Chemistry      Component Value Date/Time   NA 136 12/22/2021 1402   NA 137 06/19/2021 1121   NA 138 12/10/2014 1341   K 3.7 12/22/2021 1402   K 3.6 12/10/2014 1341   CL 103 12/22/2021 1402   CL 102 12/10/2014 1341   CO2 25 12/22/2021 1402   CO2 30 12/10/2014 1341   BUN 26 (H) 12/22/2021 1402   BUN 18 06/19/2021 1121   BUN 21 (H) 12/10/2014 1341   CREATININE 1.68 (H) 12/22/2021 1402   CREATININE 1.10 03/25/2015 1453      Component Value Date/Time   CALCIUM 9.0 12/22/2021 1402   CALCIUM 8.4 (L) 12/10/2014 1341   ALKPHOS 44 12/22/2021 1402   ALKPHOS 50 12/10/2014 1341   AST 23 12/22/2021 1402   AST 17 12/10/2014 1341   ALT 16 12/22/2021 1402   ALT 23 12/10/2014 1341   BILITOT 0.7 12/22/2021 1402   BILITOT 0.2 06/19/2021 1121   BILITOT 0.5 12/10/2014 1341       RADIOGRAPHIC STUDIES: I have personally reviewed the radiological images as listed and agreed with the findings in the report. No results found.   ASSESSMENT & PLAN:  Cancer of hilus of left lung ([place]) #  Adenocarcinoma the lung; stage IV;  SEP 2022- CT No evidence of recurrent or metastatic carcinoma within the chest, abdomen, or pelvis. STABLE  # HOLD Alimta chemotherapy. Labs today reviewed;  UNacceptable for treatment today- given rise in creatinine; B12 every third cycle cycle.    # increase in creatinie- 1.6/GFR 47; BL 1.0- HOLD alimta today; the etiology is unclear-check UA protein; recommend HOLDING off Mobic [15 mg since bursotis- summer 2022;Dr.Howard].  Do not suspect any renal obstruction.  # MSK-  left hip bursitis s/p injection- Swelling in the hands/feet-grade 1 asymptomatic.? Alimta; monitor-STABLE.  Recommend Tylenol as needed; hold off Mobic.HOLD lasix for now- see above.   #Thoracic aneurysm-approximately 4.5 cm in size; on surveillance-stable. SEP 12th CT scan-STABLE.   # port malfunction: s/p TPA- functioning well.   # DISPOSITION: de-access # HOLD Alimta today.; # UA today; urine proetin- ordered  # 1 week- bmp;  # Follow up in 3 weeks MD-labs- cbc/cmp; alimta; b12/-Dr.BB    Orders Placed This Encounter  Procedures   Protein / creatinine ratio, urine    Standing Status:   Future    Standing Expiration Date:   12/22/2022   Urinalysis, Complete w Microscopic    Standing Status:   Future    Standing Expiration Date:   2/83/6629   Basic metabolic panel    Standing Status:   Future    Standing Expiration Date:   12/22/2022     All questions were answered. The patient knows to call the clinic with any problems, questions or concerns.      [name], MD 12/22/2021 3:08 PM

## 2021-12-22 NOTE — Patient Instructions (Signed)
#   Recommend STOPPING MOBIC; increase in PO fluids intake; HOLD lasix for few days. Tylenol as needed for pain.

## 2021-12-28 ENCOUNTER — Inpatient Hospital Stay: Payer: BC Managed Care – PPO

## 2021-12-28 ENCOUNTER — Other Ambulatory Visit: Payer: Self-pay

## 2021-12-28 DIAGNOSIS — C3402 Malignant neoplasm of left main bronchus: Secondary | ICD-10-CM | POA: Diagnosis not present

## 2021-12-28 DIAGNOSIS — M7072 Other bursitis of hip, left hip: Secondary | ICD-10-CM | POA: Diagnosis not present

## 2021-12-28 DIAGNOSIS — I712 Thoracic aortic aneurysm, without rupture, unspecified: Secondary | ICD-10-CM | POA: Diagnosis not present

## 2021-12-28 DIAGNOSIS — Z8679 Personal history of other diseases of the circulatory system: Secondary | ICD-10-CM | POA: Diagnosis not present

## 2021-12-28 DIAGNOSIS — R7989 Other specified abnormal findings of blood chemistry: Secondary | ICD-10-CM

## 2021-12-28 DIAGNOSIS — Z806 Family history of leukemia: Secondary | ICD-10-CM | POA: Diagnosis not present

## 2021-12-28 LAB — BASIC METABOLIC PANEL
Anion gap: 7 (ref 5–15)
BUN: 18 mg/dL (ref 6–20)
CO2: 28 mmol/L (ref 22–32)
Calcium: 9.4 mg/dL (ref 8.9–10.3)
Chloride: 101 mmol/L (ref 98–111)
Creatinine, Ser: 1.02 mg/dL (ref 0.61–1.24)
GFR, Estimated: >60 mL/min (ref >60)
Glucose, Bld: 110 mg/dL — ABNORMAL HIGH (ref 70–99)
Potassium: 4 mmol/L (ref 3.5–5.1)
Sodium: 136 mmol/L (ref 135–145)

## 2021-12-28 NOTE — Progress Notes (Signed)
Hi- you kidney numbers are normal. I guess it could have been from meloixcam. Just just take meloxicam sparingly; and if needed tylenol is fine. Keep appts as planned. GB

## 2021-12-29 ENCOUNTER — Other Ambulatory Visit: Payer: BC Managed Care – PPO

## 2022-01-12 ENCOUNTER — Inpatient Hospital Stay: Payer: BC Managed Care – PPO | Attending: Internal Medicine

## 2022-01-12 ENCOUNTER — Other Ambulatory Visit: Payer: Self-pay

## 2022-01-12 ENCOUNTER — Encounter: Payer: Self-pay | Admitting: Internal Medicine

## 2022-01-12 ENCOUNTER — Inpatient Hospital Stay: Payer: BC Managed Care – PPO

## 2022-01-12 ENCOUNTER — Inpatient Hospital Stay (HOSPITAL_BASED_OUTPATIENT_CLINIC_OR_DEPARTMENT_OTHER): Payer: BC Managed Care – PPO | Admitting: Internal Medicine

## 2022-01-12 DIAGNOSIS — Z806 Family history of leukemia: Secondary | ICD-10-CM | POA: Diagnosis not present

## 2022-01-12 DIAGNOSIS — D696 Thrombocytopenia, unspecified: Secondary | ICD-10-CM

## 2022-01-12 DIAGNOSIS — C3402 Malignant neoplasm of left main bronchus: Secondary | ICD-10-CM

## 2022-01-12 DIAGNOSIS — Z5111 Encounter for antineoplastic chemotherapy: Secondary | ICD-10-CM | POA: Insufficient documentation

## 2022-01-12 DIAGNOSIS — I712 Thoracic aortic aneurysm, without rupture, unspecified: Secondary | ICD-10-CM | POA: Insufficient documentation

## 2022-01-12 DIAGNOSIS — Z8679 Personal history of other diseases of the circulatory system: Secondary | ICD-10-CM | POA: Insufficient documentation

## 2022-01-12 LAB — CBC WITH DIFFERENTIAL/PLATELET
Abs Immature Granulocytes: 0.02 10*3/uL (ref 0.00–0.07)
Basophils Absolute: 0 10*3/uL (ref 0.0–0.1)
Basophils Relative: 1 %
Eosinophils Absolute: 0.3 10*3/uL (ref 0.0–0.5)
Eosinophils Relative: 5 %
HCT: 43 % (ref 39.0–52.0)
Hemoglobin: 14.7 g/dL (ref 13.0–17.0)
Immature Granulocytes: 0 %
Lymphocytes Relative: 41 %
Lymphs Abs: 2.3 10*3/uL (ref 0.7–4.0)
MCH: 32 pg (ref 26.0–34.0)
MCHC: 34.2 g/dL (ref 30.0–36.0)
MCV: 93.7 fL (ref 80.0–100.0)
Monocytes Absolute: 0.4 10*3/uL (ref 0.1–1.0)
Monocytes Relative: 7 %
Neutro Abs: 2.6 10*3/uL (ref 1.7–7.7)
Neutrophils Relative %: 46 %
Platelets: 137 10*3/uL — ABNORMAL LOW (ref 150–400)
RBC: 4.59 MIL/uL (ref 4.22–5.81)
RDW: 11.8 % (ref 11.5–15.5)
WBC: 5.6 10*3/uL (ref 4.0–10.5)
nRBC: 0 % (ref 0.0–0.2)

## 2022-01-12 LAB — COMPREHENSIVE METABOLIC PANEL
ALT: 17 U/L (ref 0–44)
AST: 23 U/L (ref 15–41)
Albumin: 4 g/dL (ref 3.5–5.0)
Alkaline Phosphatase: 43 U/L (ref 38–126)
Anion gap: 6 (ref 5–15)
BUN: 20 mg/dL (ref 6–20)
CO2: 25 mmol/L (ref 22–32)
Calcium: 8.8 mg/dL — ABNORMAL LOW (ref 8.9–10.3)
Chloride: 103 mmol/L (ref 98–111)
Creatinine, Ser: 1.06 mg/dL (ref 0.61–1.24)
GFR, Estimated: >60 mL/min (ref >60)
Glucose, Bld: 167 mg/dL — ABNORMAL HIGH (ref 70–99)
Potassium: 3.8 mmol/L (ref 3.5–5.1)
Sodium: 134 mmol/L — ABNORMAL LOW (ref 135–145)
Total Bilirubin: 0.4 mg/dL (ref 0.3–1.2)
Total Protein: 7.3 g/dL (ref 6.5–8.1)

## 2022-01-12 MED ORDER — SODIUM CHLORIDE 0.9% FLUSH
10.0000 mL | INTRAVENOUS | Status: DC | PRN
  Filled 2022-01-12: qty 10

## 2022-01-12 MED ORDER — DEXAMETHASONE SODIUM PHOSPHATE 10 MG/ML IJ SOLN
6.0000 mg | Freq: Once | INTRAMUSCULAR | Status: AC
  Administered 2022-01-12: 6 mg via INTRAVENOUS
  Filled 2022-01-12: qty 1

## 2022-01-12 MED ORDER — HEPARIN SOD (PORK) LOCK FLUSH 100 UNIT/ML IV SOLN
INTRAVENOUS | Status: AC
  Administered 2022-01-12: 500 [IU]
  Filled 2022-01-12: qty 5

## 2022-01-12 MED ORDER — HEPARIN SOD (PORK) LOCK FLUSH 100 UNIT/ML IV SOLN
500.0000 [IU] | Freq: Once | INTRAVENOUS | Status: AC | PRN
  Filled 2022-01-12: qty 5

## 2022-01-12 MED ORDER — PROCHLORPERAZINE MALEATE 10 MG PO TABS
10.0000 mg | ORAL_TABLET | Freq: Once | ORAL | Status: AC
  Administered 2022-01-12: 10 mg via ORAL
  Filled 2022-01-12: qty 1

## 2022-01-12 MED ORDER — SODIUM CHLORIDE 0.9 % IV SOLN
Freq: Once | INTRAVENOUS | Status: AC
  Filled 2022-01-12: qty 250

## 2022-01-12 MED ORDER — SODIUM CHLORIDE 0.9 % IV SOLN
500.0000 mg/m2 | Freq: Once | INTRAVENOUS | Status: AC
  Administered 2022-01-12: 1100 mg via INTRAVENOUS
  Filled 2022-01-12: qty 40

## 2022-01-12 MED ORDER — CYANOCOBALAMIN 1000 MCG/ML IJ SOLN
1000.0000 ug | Freq: Once | INTRAMUSCULAR | Status: AC
  Administered 2022-01-12: 1000 ug via INTRAMUSCULAR
  Filled 2022-01-12: qty 1

## 2022-01-12 NOTE — Patient Instructions (Signed)
[facility] CANCER CTR AT [place]  Discharge Instructions: Thank you for choosing [facility] to provide your oncology and hematology care.  If you have a lab appointment with the [facility], please go directly to the [facility] and check in at the registration area.  Wear comfortable clothing and clothing appropriate for easy access to any Portacath or PICC line.   We strive to give you quality time with your provider. You may need to reschedule your appointment if you arrive late (15 or more minutes).  Arriving late affects you and other patients whose appointments are after yours.  Also, if you miss three or more appointments without notifying the office, you may be dismissed from the clinic at the providers discretion.      For prescription refill requests, have your pharmacy contact our office and allow 72 hours for refills to be completed.    Today you received the following chemotherapy and/or immunotherapy agents - pemetrexed      To help prevent nausea and vomiting after your treatment, we encourage you to take your nausea medication as directed.  BELOW ARE SYMPTOMS THAT SHOULD BE REPORTED IMMEDIATELY: *FEVER GREATER THAN 100.4 F (38 C) OR HIGHER *CHILLS OR SWEATING *NAUSEA AND VOMITING THAT IS NOT CONTROLLED WITH YOUR NAUSEA MEDICATION *UNUSUAL SHORTNESS OF BREATH *UNUSUAL BRUISING OR BLEEDING *URINARY PROBLEMS (pain or burning when urinating, or frequent urination) *BOWEL PROBLEMS (unusual diarrhea, constipation, pain near the anus) TENDERNESS IN MOUTH AND THROAT WITH OR WITHOUT PRESENCE OF ULCERS (sore throat, sores in mouth, or a toothache) UNUSUAL RASH, SWELLING OR PAIN  UNUSUAL VAGINAL DISCHARGE OR ITCHING   Items with * indicate a potential emergency and should be followed up as soon as possible or go to the Emergency Department if any problems should occur.  Please show the CHEMOTHERAPY ALERT CARD or IMMUNOTHERAPY ALERT CARD at check-in  to the Emergency Department and triage nurse.  Should you have questions after your visit or need to cancel or reschedule your appointment, please contact [facility] CANCER [place] AT [place]  [phone] and follow the prompts.  Office hours are 8:00 a.m. to 4:30 p.m. Monday - Friday. Please note that voicemails left after 4:00 p.m. may not be returned until the following business day.  We are closed weekends and major holidays. You have access to a nurse at all times for urgent questions. Please call the main number to the clinic [phone] and follow the prompts.  For any non-urgent questions, you may also contact your provider using MyChart. We now offer e-Visits for anyone 59 and older to request care online for non-urgent symptoms. For details visit [email or web address].   Also download the MyChart app! Go to the app store, search "MyChart", open the app, select [facility], and log in with your MyChart username and password.  Due to Covid, a mask is required upon entering the hospital/clinic. If you do not have a mask, one will be given to you upon arrival. For doctor visits, patients may have 1 support person aged 50 or older with them. For treatment visits, patients cannot have anyone with them due to current Covid guidelines and our immunocompromised population.   Pemetrexed injection What is this medication? PEMETREXED (PEM e TREX ed) is a chemotherapy drug used to treat lung cancers like non-small cell lung cancer and mesothelioma. It may also be used to treat other cancers. This medicine may be used for other purposes; ask your health care provider or pharmacist if you have  questions. COMMON BRAND NAME(S): Alimta, PEMFEXY What should I tell my care team before I take this medication? They need to know if you have any of these conditions: infection (especially a virus infection such as chickenpox, cold sores, or herpes) kidney disease low blood counts, like low white  cell, platelet, or red cell counts lung or breathing disease, like asthma radiation therapy an unusual or allergic reaction to pemetrexed, other medicines, foods, dyes, or preservative pregnant or trying to get pregnant breast-feeding How should I use this medication? This drug is given as an infusion into a vein. It is administered in a hospital or clinic by a specially trained health care professional. Talk to your pediatrician regarding the use of this medicine in children. Special care may be needed. Overdosage: If you think you have taken too much of this medicine contact a poison control center or emergency room at once. NOTE: This medicine is only for you. Do not share this medicine with others. What if I miss a dose? It is important not to miss your dose. Call your doctor or health care professional if you are unable to keep an appointment. What may interact with this medication? This medicine may interact with the following medications: Ibuprofen This list may not describe all possible interactions. Give your health care provider a list of all the medicines, herbs, non-prescription drugs, or dietary supplements you use. Also tell them if you smoke, drink alcohol, or use illegal drugs. Some items may interact with your medicine. What should I watch for while using this medication? Visit your doctor for checks on your progress. This drug may make you feel generally unwell. This is not uncommon, as chemotherapy can affect healthy cells as well as cancer cells. Report any side effects. Continue your course of treatment even though you feel ill unless your doctor tells you to stop. In some cases, you may be given additional medicines to help with side effects. Follow all directions for their use. Call your doctor or health care professional for advice if you get a fever, chills or sore throat, or other symptoms of a cold or flu. Do not treat yourself. This drug decreases your body's ability  to fight infections. Try to avoid being around people who are sick. This medicine may increase your risk to bruise or bleed. Call your doctor or health care professional if you notice any unusual bleeding. Be careful brushing and flossing your teeth or using a toothpick because you may get an infection or bleed more easily. If you have any dental work done, tell your dentist you are receiving this medicine. Avoid taking products that contain aspirin, acetaminophen, ibuprofen, naproxen, or ketoprofen unless instructed by your doctor. These medicines may hide a fever. Call your doctor or health care professional if you get diarrhea or mouth sores. Do not treat yourself. To protect your kidneys, drink water or other fluids as directed while you are taking this medicine. Do not become pregnant while taking this medicine or for 6 months after stopping it. Women should inform their doctor if they wish to become pregnant or think they might be pregnant. Men should not father a child while taking this medicine and for 3 months after stopping it. This may interfere with the ability to father a child. You should talk to your doctor or health care professional if you are concerned about your fertility. There is a potential for serious side effects to an unborn child. Talk to your health care professional or pharmacist  for more information. Do not breast-feed an infant while taking this medicine or for 1 week after stopping it. What side effects may I notice from receiving this medication? Side effects that you should report to your doctor or health care professional as soon as possible: allergic reactions like skin rash, itching or hives, swelling of the face, lips, or tongue breathing problems redness, blistering, peeling or loosening of the skin, including inside the mouth signs and symptoms of bleeding such as bloody or black, tarry stools; red or dark-brown urine; spitting up blood or brown material that looks  like coffee grounds; red spots on the skin; unusual bruising or bleeding from the eye, gums, or nose signs and symptoms of infection like fever or chills; cough; sore throat; pain or trouble passing urine signs and symptoms of kidney injury like trouble passing urine or change in the amount of urine signs and symptoms of liver injury like dark yellow or brown urine; general ill feeling or flu-like symptoms; light-colored stools; loss of appetite; nausea; right upper belly pain; unusually weak or tired; yellowing of the eyes or skin Side effects that usually do not require medical attention (report to your doctor or health care professional if they continue or are bothersome): constipation mouth sores nausea, vomiting unusually weak or tired This list may not describe all possible side effects. Call your doctor for medical advice about side effects. You may report side effects to FDA at 1-800-FDA-1088. Where should I keep my medication? This drug is given in a hospital or clinic and will not be stored at home. NOTE: This sheet is a summary. It may not cover all possible information. If you have questions about this medicine, talk to your doctor, pharmacist, or health care provider.  2022 Elsevier/Gold Standard (2018-01-14 00:00:00)

## 2022-01-12 NOTE — Progress Notes (Signed)
[facility] OFFICE PROGRESS NOTE  Patient Care Team: [name], MD as PCP - General (Family Medicine) [name], MD as Consulting Physician (Internal Medicine)   Cancer Staging  No matching staging information was found for the patient.   Oncology History Overview Note  # 2012- METASTATIC ADENO CA of LEFT LUNG [acinar pattern] s/p MED LN Bx [NEG- EGFR/K-ras/ALK mutation];[name]; [name]; CT JULY  2016- NED; JAN 2018- NED;   # AUG 2020-clinical trial closed; continue maintenance Alimta  # Thoracic aortic aneurysm-4.0 cm in 2016; currently 4.6 cm.  s/p referral to Dr. Faith Rogue.; MRI in 6 months planned.  ----------------------------------------------------------    DIAGNOSIS: [name]  STAGE: 4       ;GOALS: Palliative  CURRENT/MOST RECENT THERAPY '[ ]'  Alimta maintenance    Cancer of hilus of left lung ([place])  07/20/2016 Initial Diagnosis   Cancer of hilus of left lung ([place])   12/11/2019 -  Chemotherapy   Patient is on Treatment Plan : LUNG Pemetrexed (Alimta) q21d       INTERVAL HISTORY: Alone.  Ambulating independently.  Mitchell Mosley 61 y.o.  male pleasant patient above history of metastatic adenocarcinoma the lung-NED currently on maintenance Alimta is here for follow-up.  Patient's chemotherapy was held last visit because of elevated creatinine.  Patient since stopped taking Lasix/Mobic.  Denies any worsening leg swelling.  Denies any worsening joint pains.   Review of Systems  Constitutional:  Negative for chills, diaphoresis, fever, malaise/fatigue and weight loss.  HENT:  Negative for nosebleeds and sore throat.   Eyes:  Negative for double vision.  Respiratory:  Negative for hemoptysis, shortness of breath and wheezing.   Cardiovascular:  Positive for leg swelling. Negative for chest pain, palpitations and  orthopnea.  Gastrointestinal:  Negative for abdominal pain, blood in stool, constipation, diarrhea, heartburn, melena, nausea and vomiting.  Genitourinary:  Negative for dysuria, frequency and urgency.  Musculoskeletal:  Positive for joint pain.  Skin: Negative.  Negative for itching and rash.  Neurological:  Negative for dizziness, tingling, focal weakness, weakness and headaches.  Endo/Heme/Allergies:  Does not bruise/bleed easily.  Psychiatric/Behavioral:  Negative for depression. The patient is not nervous/anxious and does not have insomnia.      PAST MEDICAL HISTORY :  Past Medical History:  Diagnosis Date   Allergy    Lung cancer ([place])    Mild valvular heart disease Nov. 2014   per 2 D Echocardiogram done for persistent leg edema, monitored by cardiology   Peripheral edema    Thoracic aortic aneurysm (TAA)     PAST SURGICAL HISTORY :   Past Surgical History:  Procedure Laterality Date   ELBOW SURGERY Right 2011   IR CV LINE INJECTION  09/11/2021   Left anterior thoracotomy with biopsy  March 2012   PORTACATH PLACEMENT  March 2012   TONSILLECTOMY  as child    FAMILY HISTORY :   Family History  Problem Relation Age of Onset   Leukemia Father    Diabetes Mother     SOCIAL HISTORY:   Social History   Tobacco Use   Smoking status: Never   Smokeless tobacco: Never  Vaping Use   Vaping Use: Never used  Substance Use Topics   Alcohol use: Yes    Alcohol/week: 0.0 standard drinks    Comment: "social drinker"    Drug use: No    ALLERGIES:  is allergic to penicillins.  MEDICATIONS:  Current Outpatient Medications  Medication Sig Dispense Refill   folic acid (FOLVITE) 1 MG tablet TAKE 1 TABLET BY MOUTH EVERY DAY 90 tablet 3   HYDROcodone-acetaminophen (NORCO) 5-325 MG tablet Take 1 tablet by mouth every 6 (six) hours as needed for moderate pain. 30 tablet 0   Multiple Vitamin (MULTIVITAMIN) capsule Take 1 capsule by mouth daily. Reported on 02/24/2016     NON  FORMULARY Relief Factor with vitamin D and Turmeric     Omega-3 Fatty Acids (FISH OIL) 1000 MG CAPS Take 1 capsule by mouth daily. Reported on 02/24/2016     senna (SENOKOT) 8.6 MG tablet Take 1 tablet by mouth daily. Take as needed around time of chemotherapy     sildenafil (VIAGRA) 100 MG tablet TAKE 1 TABLET BY MOUTH EVERY DAY AS NEEDED FOR ERECTILE DYSFUNCTION 20 tablet 11   spironolactone (ALDACTONE) 25 MG tablet TAKE 1 TABLET BY MOUTH EVERY DAY 90 tablet 3   tadalafil (CIALIS) 20 MG tablet Take one tablet every 2 days as needed 50 tablet 5   vitamin B-12 (CYANOCOBALAMIN) 1000 MCG tablet Take 1,000 mcg by mouth daily.     furosemide (LASIX) 20 MG tablet TAKE 1 TABLET (20 MG TOTAL) BY MOUTH DAILY AS NEEDED FOR SWELLING (Patient not taking: Reported on 01/12/2022) 90 tablet 1   meloxicam (MOBIC) 15 MG tablet Take 15 mg by mouth daily. (Patient not taking: Reported on 01/12/2022)     No current facility-administered medications for this visit.   Facility-Administered Medications Ordered in Other Visits  Medication Dose Route Frequency Provider Last Rate Last Admin   heparin lock flush 100 unit/mL  500 Units Intravenous Once Charlaine Dalton R, MD       sodium chloride 0.9 % injection 10 mL  10 mL Intracatheter PRN [name], MD   10 mL at 05/27/15 1543   sodium chloride 0.9 % injection 10 mL  10 mL Intracatheter PRN [name], MD   10 mL at 06/17/15 1510   sodium chloride 0.9 % injection 10 mL  10 mL Intravenous PRN [name], MD   10 mL at 07/08/15 1340   sodium chloride flush (NS) 0.9 % injection 10 mL  10 mL Intravenous PRN Verlon Au, NP   10 mL at 09/30/20 1412   sodium chloride flush (NS) 0.9 % injection 10 mL  10 mL Intracatheter PRN [name], MD        PHYSICAL EXAMINATION: ECOG PERFORMANCE STATUS: 0 - Asymptomatic  BP (!) 143/89 (BP Location: Left Arm, Patient Position: Sitting, Cuff Size: Normal)    Pulse 91    Temp 98.3 F (36.8 C) (Tympanic)     Ht 5' 10.5" (1.791 m)    Wt 214 lb (97.1 kg)    SpO2 100%    BMI 30.27 kg/m   Filed Weights   01/12/22 1429  Weight: 214 lb (97.1 kg)    Physical Exam HENT:     Head: Normocephalic and atraumatic.     Mouth/Throat:     Pharynx: No oropharyngeal exudate.  Eyes:     Pupils: Pupils are equal, round, and reactive to light.  Cardiovascular:     Rate and Rhythm: Normal rate and regular rhythm.  Pulmonary:     Effort: No respiratory distress.     Breath sounds: No wheezing.  Abdominal:     General: Bowel sounds are normal. There is no distension.     Palpations: Abdomen  is soft. There is no mass.     Tenderness: There is no abdominal tenderness. There is no guarding or rebound.  Musculoskeletal:        General: No tenderness. Normal range of motion.     Cervical back: Normal range of motion and neck supple.     Comments: Grade 1 swelling in the legs bilaterally.  Skin:    General: Skin is warm.  Neurological:     Mental Status: He is alert and oriented to person, place, and time.  Psychiatric:        Mood and Affect: Affect normal.     LABORATORY DATA:  I have reviewed the data as listed    Component Value Date/Time   NA 134 (L) 01/12/2022 1416   NA 137 06/19/2021 1121   NA 138 12/10/2014 1341   K 3.8 01/12/2022 1416   K 3.6 12/10/2014 1341   CL 103 01/12/2022 1416   CL 102 12/10/2014 1341   CO2 25 01/12/2022 1416   CO2 30 12/10/2014 1341   GLUCOSE 167 (H) 01/12/2022 1416   GLUCOSE 160 (H) 12/10/2014 1341   BUN 20 01/12/2022 1416   BUN 18 06/19/2021 1121   BUN 21 (H) 12/10/2014 1341   CREATININE 1.06 01/12/2022 1416   CREATININE 1.10 03/25/2015 1453   CALCIUM 8.8 (L) 01/12/2022 1416   CALCIUM 8.4 (L) 12/10/2014 1341   PROT 7.3 01/12/2022 1416   PROT 7.3 06/19/2021 1121   PROT 6.6 12/10/2014 1341   ALBUMIN 4.0 01/12/2022 1416   ALBUMIN 4.5 06/19/2021 1121   ALBUMIN 3.4 12/10/2014 1341   AST 23 01/12/2022 1416   AST 17 12/10/2014 1341   ALT 17 01/12/2022  1416   ALT 23 12/10/2014 1341   ALKPHOS 43 01/12/2022 1416   ALKPHOS 50 12/10/2014 1341   BILITOT 0.4 01/12/2022 1416   BILITOT 0.2 06/19/2021 1121   BILITOT 0.5 12/10/2014 1341   GFRNONAA >60 01/12/2022 1416   GFRNONAA >60 03/25/2015 1453   GFRAA >60 08/19/2020 1324   GFRAA >60 03/25/2015 1453    No results found for: SPEP, UPEP  Lab Results  Component Value Date   WBC 5.6 01/12/2022   NEUTROABS 2.6 01/12/2022   HGB 14.7 01/12/2022   HCT 43.0 01/12/2022   MCV 93.7 01/12/2022   PLT 137 (L) 01/12/2022      Chemistry      Component Value Date/Time   NA 134 (L) 01/12/2022 1416   NA 137 06/19/2021 1121   NA 138 12/10/2014 1341   K 3.8 01/12/2022 1416   K 3.6 12/10/2014 1341   CL 103 01/12/2022 1416   CL 102 12/10/2014 1341   CO2 25 01/12/2022 1416   CO2 30 12/10/2014 1341   BUN 20 01/12/2022 1416   BUN 18 06/19/2021 1121   BUN 21 (H) 12/10/2014 1341   CREATININE 1.06 01/12/2022 1416   CREATININE 1.10 03/25/2015 1453      Component Value Date/Time   CALCIUM 8.8 (L) 01/12/2022 1416   CALCIUM 8.4 (L) 12/10/2014 1341   ALKPHOS 43 01/12/2022 1416   ALKPHOS 50 12/10/2014 1341   AST 23 01/12/2022 1416   AST 17 12/10/2014 1341   ALT 17 01/12/2022 1416   ALT 23 12/10/2014 1341   BILITOT 0.4 01/12/2022 1416   BILITOT 0.2 06/19/2021 1121   BILITOT 0.5 12/10/2014 1341       RADIOGRAPHIC STUDIES: I have personally reviewed the radiological images as listed and agreed with the findings in the  report. No results found.   ASSESSMENT & PLAN:  Cancer of hilus of left lung ([place]) # Adenocarcinoma the lung; stage IV;  SEP 2022- CT No evidence of recurrent or metastatic carcinoma within the chest, abdomen, or pelvis. STABLE  # proceed with Alimta chemotherapy. Labs today reviewed;  acceptable for treatment today. B12 every third cycle.   #January 2023- increase in creatinie- 1.6/GFR 47; BL 1.0; JAN 2023-UA protein; recommend HOLDING Lasix and off Mobic/other nephrotoxic  NSAIDs.  Do not suspect any renal obstruction.  Repeat creatinine- WNL.  Discussed regarding taking Tylenol as needed.  And also Lasix as needed.  #Thoracic aneurysm-approximately 4.5 cm in size; on surveillance-stable. SEP 12th CT scan-STABLE.   # port malfunction: s/p TPA- functioning well.   b12 -2/10 # DISPOSITION:  # proceed with Alimta; b12 today # Follow up in 3 weeks MD-labs- cbc/cmp; alimta;=-Dr.B    No orders of the defined types were placed in this encounter.    All questions were answered. The patient knows to call the clinic with any problems, questions or concerns.      [name], MD 01/12/2022 4:30 PM

## 2022-01-12 NOTE — Assessment & Plan Note (Addendum)
#   Adenocarcinoma the lung; stage IV;  SEP 2022- CT No evidence of recurrent or metastatic carcinoma within the chest, abdomen, or pelvis. STABLE  # proceed with Alimta chemotherapy. Labs today reviewed;  acceptable for treatment today. B12 every third cycle.   #January 2023- increase in creatinie- 1.6/GFR 47; BL 1.0; JAN 2023-UA protein; recommend HOLDING Lasix and off Mobic/other nephrotoxic NSAIDs.  Do not suspect any renal obstruction.  Repeat creatinine- WNL.  Discussed regarding taking Tylenol as needed.  And also Lasix as needed.  #Thoracic aneurysm-approximately 4.5 cm in size; on surveillance-stable. SEP 12th CT scan-STABLE.   # port malfunction: s/p TPA- functioning well.   b12 -2/10 # DISPOSITION:  # proceed with Alimta; b12 today # Follow up in 3 weeks MD-labs- cbc/cmp; alimta;=-Dr.B

## 2022-01-24 ENCOUNTER — Encounter: Payer: Self-pay | Admitting: Physician Assistant

## 2022-01-24 ENCOUNTER — Other Ambulatory Visit: Payer: Self-pay

## 2022-01-24 ENCOUNTER — Ambulatory Visit: Payer: BC Managed Care – PPO | Admitting: Physician Assistant

## 2022-01-24 VITALS — BP 130/69 | HR 96 | Ht 70.0 in | Wt 210.6 lb

## 2022-01-24 DIAGNOSIS — R0789 Other chest pain: Secondary | ICD-10-CM | POA: Diagnosis not present

## 2022-01-24 DIAGNOSIS — J209 Acute bronchitis, unspecified: Secondary | ICD-10-CM

## 2022-01-24 MED ORDER — BENZONATATE 100 MG PO CAPS: 100.0000 mg | ORAL_CAPSULE | Freq: Three times a day (TID) | ORAL | 0 refills | Status: DC

## 2022-01-24 MED ORDER — FLUTICASONE PROPIONATE 50 MCG/ACT NA SUSP: 2.0000 | Freq: Every day | NASAL | 6 refills | Status: DC

## 2022-01-24 NOTE — Patient Instructions (Signed)
Recombinant Zoster (Shingles) Vaccine: What You Need to Know 1. Why get vaccinated? Recombinant zoster (shingles) vaccine can prevent shingles. Shingles (also called herpes zoster, or just zoster) is a painful skin rash, usually with blisters. In addition to the rash, shingles can cause fever, headache, chills, or upset stomach. Rarely, shingles can lead to complications such as pneumonia, hearing problems, blindness, brain inflammation (encephalitis), or death. The risk of shingles increases with age. The most common complication of shingles is long-term nerve pain called postherpetic neuralgia (PHN). PHN occurs in the areas where the shingles rash was and can last for months or years after the rash goes away. The pain from PHN can be severe and debilitating. The risk of PHN increases with age. An older adult with shingles is more likely to develop PHN and have longer lasting and more severe pain than a younger person. People with weakened immune systems also have a higher risk of getting shingles and complications from the disease. Shingles is caused by varicella-zoster virus, the same virus that causes chickenpox. After you have chickenpox, the virus stays in your body and can cause shingles later in life. Shingles cannot be passed from one person to another, but the virus that causes shingles can spread and cause chickenpox in someone who has never had chickenpox or has never received chickenpox vaccine. 2. Recombinant shingles vaccine Recombinant shingles vaccine provides strong protection against shingles. By preventing shingles, recombinant shingles vaccine also protects against PHN and other complications. Recombinant shingles vaccine is recommended for: Adults 63 years and older Adults 19 years and older who have a weakened immune system because of disease or treatments Shingles vaccine is given as a two-dose series. For most people, the second dose should be given 2 to 6 months after the first  dose. Some people who have or will have a weakened immune system can get the second dose 1 to 2 months after the first dose. Ask your health care provider for guidance. People who have had shingles in the past and people who have received varicella (chickenpox) vaccine are recommended to get recombinant shingles vaccine. The vaccine is also recommended for people who have already gotten another type of shingles vaccine, the live shingles vaccine. There is no live virus in recombinant shingles vaccine. Shingles vaccine may be given at the same time as other vaccines. 3. Talk with your health care provider Tell your vaccination provider if the person getting the vaccine: Has had an allergic reaction after a previous dose of recombinant shingles vaccine, or has any severe, life-threatening allergies Is currently experiencing an episode of shingles Is pregnant In some cases, your health care provider may decide to postpone shingles vaccination until a future visit. People with minor illnesses, such as a cold, may be vaccinated. People who are moderately or severely ill should usually wait until they recover before getting recombinant shingles vaccine. Your health care provider can give you more information. 4. Risks of a vaccine reaction A sore arm with mild or moderate pain is very common after recombinant shingles vaccine. Redness and swelling can also happen at the site of the injection. Tiredness, muscle pain, headache, shivering, fever, stomach pain, and nausea are common after recombinant shingles vaccine. These side effects may temporarily prevent a vaccinated person from doing regular activities. Symptoms usually go away on their own in 2 to 3 days. You should still get the second dose of recombinant shingles vaccine even if you had one of these reactions after the first dose. Guillain-Barr  syndrome (GBS), a serious nervous system disorder, has been reported very rarely after recombinant zoster  vaccine. People sometimes faint after medical procedures, including vaccination. Tell your provider if you feel dizzy or have vision changes or ringing in the ears. As with any medicine, there is a very remote chance of a vaccine causing a severe allergic reaction, other serious injury, or death. 5. What if there is a serious problem? An allergic reaction could occur after the vaccinated person leaves the clinic. If you see signs of a severe allergic reaction (hives, swelling of the face and throat, difficulty breathing, a fast heartbeat, dizziness, or weakness), call 9-1-1 and get the person to the nearest hospital. For other signs that concern you, call your health care provider. Adverse reactions should be reported to the Vaccine Adverse Event Reporting System (VAERS). Your health care provider will usually file this report, or you can do it yourself. Visit the VAERS website at www.vaers.SamedayNews.es or call 223-122-5227. VAERS is only for reporting reactions, and VAERS staff members do not give medical advice. 6. How can I learn more? Ask your health care provider. Call your local or state health department. Visit the website of the Food and Drug Administration (FDA) for vaccine package inserts and additional information at http://lopez-wang.org/. Contact the Centers for Disease Control and Prevention (CDC): Call 551-227-2812 (1-800-CDC-INFO) or Visit CDC's website at http://hunter.com/. Vaccine Information Statement Recombinant Zoster Vaccine (01/06/2021) This information is not intended to replace advice given to you by your health care provider. Make sure you discuss any questions you have with your health care provider. Document Revised: 08/04/2021 Document Reviewed: 01/20/2021 Elsevier Patient Education  2022 [employer].

## 2022-01-24 NOTE — Progress Notes (Signed)
I,Mitchell Mosley,acting as a Education administrator for [employer], PA-C.,have documented all relevant documentation on the behalf of Mitchell Kirschner, PA-C,as directed by  Mitchell Kirschner, PA-C while in the presence of Mitchell Kirschner, PA-C.  Acute Office Visit  Subjective:    Patient ID: Mitchell Mosley, male    DOB: 1960/12/20, 61 y.o.   MRN: [id number]  Cc. Cough, chest pain   HPI [name] is a 61 y/o male w/ a PMH of lung cancer, thoracic aneurysm who presents today with symptoms of cough, sore throat, sinus pressure, chest pain x 3 days. Reports similar symptoms three weeks ago, but he used a humidifier, took cough medication OTC and his symptoms went away.  He feels this chest pain while coughing, center of chest, for a brief moment. He does state he has had this pain for almost a year on and off, was told it was costochondritis.  Reports most people he is around are sick. Denies testing for COVID.   Past Medical History:  Diagnosis Date   Allergy    Lung cancer ([place])    Mild valvular heart disease Nov. 2014   per 2 D Echocardiogram done for persistent leg edema, monitored by cardiology   Peripheral edema    Thoracic aortic aneurysm (TAA)     Past Surgical History:  Procedure Laterality Date   ELBOW SURGERY Right 2011   IR CV LINE INJECTION  09/11/2021   Left anterior thoracotomy with biopsy  March 2012   PORTACATH PLACEMENT  March 2012   TONSILLECTOMY  as child    Family History  Problem Relation Age of Onset   Leukemia Father    Diabetes Mother     Social History   Socioeconomic History   Marital status: Married    Spouse name: Not on file   Number of children: Not on file   Years of education: Not on file   Highest education level: Not on file  Occupational History   Not on file  Tobacco Use   Smoking status: Never   Smokeless tobacco: Never  Vaping Use   Vaping Use: Never used  Substance and Sexual Activity   Alcohol use: Yes    Alcohol/week: 0.0 standard drinks     Comment: "social drinker"    Drug use: No   Sexual activity: Yes  Other Topics Concern   Not on file  Social History Narrative   Not on file   Social Determinants of Health   Financial Resource Strain: Not on file  Food Insecurity: Not on file  Transportation Needs: Not on file  Physical Activity: Not on file  Stress: Not on file  Social Connections: Not on file  Intimate Partner Violence: Not on file    Outpatient Medications Prior to Visit  Medication Sig Dispense Refill   folic acid (FOLVITE) 1 MG tablet TAKE 1 TABLET BY MOUTH EVERY DAY 90 tablet 3   furosemide (LASIX) 20 MG tablet TAKE 1 TABLET (20 MG TOTAL) BY MOUTH DAILY AS NEEDED FOR SWELLING (Patient not taking: Reported on 01/12/2022) 90 tablet 1   HYDROcodone-acetaminophen (NORCO) 5-325 MG tablet Take 1 tablet by mouth every 6 (six) hours as needed for moderate pain. 30 tablet 0   meloxicam (MOBIC) 15 MG tablet Take 15 mg by mouth daily. (Patient not taking: Reported on 01/12/2022)     Multiple Vitamin (MULTIVITAMIN) capsule Take 1 capsule by mouth daily. Reported on 02/24/2016     NON FORMULARY Relief Factor with vitamin D and Turmeric  Omega-3 Fatty Acids (FISH OIL) 1000 MG CAPS Take 1 capsule by mouth daily. Reported on 02/24/2016     senna (SENOKOT) 8.6 MG tablet Take 1 tablet by mouth daily. Take as needed around time of chemotherapy     sildenafil (VIAGRA) 100 MG tablet TAKE 1 TABLET BY MOUTH EVERY DAY AS NEEDED FOR ERECTILE DYSFUNCTION 20 tablet 11   spironolactone (ALDACTONE) 25 MG tablet TAKE 1 TABLET BY MOUTH EVERY DAY 90 tablet 3   tadalafil (CIALIS) 20 MG tablet Take one tablet every 2 days as needed 50 tablet 5   vitamin B-12 (CYANOCOBALAMIN) 1000 MCG tablet Take 1,000 mcg by mouth daily.     Facility-Administered Medications Prior to Visit  Medication Dose Route Frequency Provider Last Rate Last Admin   heparin lock flush 100 unit/mL  500 Units Intravenous Once Charlaine Dalton R, MD       sodium  chloride 0.9 % injection 10 mL  10 mL Intracatheter PRN [name], MD   10 mL at 05/27/15 1543   sodium chloride 0.9 % injection 10 mL  10 mL Intracatheter PRN [name], MD   10 mL at 06/17/15 1510   sodium chloride 0.9 % injection 10 mL  10 mL Intravenous PRN [name], MD   10 mL at 07/08/15 1340   sodium chloride flush (NS) 0.9 % injection 10 mL  10 mL Intravenous PRN Verlon Au, NP   10 mL at 09/30/20 1412    Allergies  Allergen Reactions   Penicillins     Reaction and severity unknown    Review of Systems  Constitutional:  Negative for fatigue and fever.  HENT:  Positive for congestion and postnasal drip.   Respiratory:  Positive for cough. Negative for shortness of breath.   Cardiovascular:  Negative for chest pain, palpitations and leg swelling.  Neurological:  Negative for dizziness and headaches.      Objective:    Physical Exam Constitutional:      General: He is awake.     Appearance: He is well-developed.  HENT:     Head: Normocephalic.     Right Ear: No middle ear effusion. Tympanic membrane is perforated. Tympanic membrane is not erythematous.     Left Ear: Tympanic membrane normal.     Ears:     Comments: Right ear TM with a small superior perforation.   Eyes:     Conjunctiva/sclera: Conjunctivae normal.  Cardiovascular:     Rate and Rhythm: Normal rate and regular rhythm.     Heart sounds: Normal heart sounds.  Pulmonary:     Effort: Pulmonary effort is normal.     Breath sounds: Normal breath sounds.  Skin:    General: Skin is warm.  Neurological:     Mental Status: He is alert and oriented to person, place, and time.  Psychiatric:        Attention and Perception: Attention normal.        Mood and Affect: Mood normal.        Speech: Speech normal.        Behavior: Behavior is cooperative.    There were no vitals taken for this visit. Wt Readings from Last 3 Encounters:  01/12/22 214 lb (97.1 kg)  12/22/21 212 lb 6.4 oz  (96.3 kg)  11/10/21 211 lb 12.8 oz (96.1 kg)    Health Maintenance Due  Topic Date Due   HIV Screening  Never done   Hepatitis C Screening  Never done  Zoster Vaccines- Shingrix (1 of 2) Never done   COLONOSCOPY (Pts 45-73yr Insurance coverage will need to be confirmed)  Never done   COVID-19 Vaccine (3 - Pfizer risk series) 03/25/2020   INFLUENZA VACCINE  07/03/2021    There are no preventive care reminders to display for this patient.   Lab Results  Component Value Date   TSH 2.190 06/19/2021   Lab Results  Component Value Date   WBC 5.6 01/12/2022   HGB 14.7 01/12/2022   HCT 43.0 01/12/2022   MCV 93.7 01/12/2022   PLT 137 (L) 01/12/2022   Lab Results  Component Value Date   NA 134 (L) 01/12/2022   K 3.8 01/12/2022   CO2 25 01/12/2022   GLUCOSE 167 (H) 01/12/2022   BUN 20 01/12/2022   CREATININE 1.06 01/12/2022   BILITOT 0.4 01/12/2022   ALKPHOS 43 01/12/2022   AST 23 01/12/2022   ALT 17 01/12/2022   PROT 7.3 01/12/2022   ALBUMIN 4.0 01/12/2022   CALCIUM 8.8 (L) 01/12/2022   ANIONGAP 6 01/12/2022   EGFR 87 06/19/2021   Lab Results  Component Value Date   CHOL 319 (H) 06/19/2021   Lab Results  Component Value Date   HDL 57 06/19/2021   Lab Results  Component Value Date   LDLCALC 181 (H) 06/19/2021   Lab Results  Component Value Date   TRIG 409 (H) 06/19/2021   Lab Results  Component Value Date   CHOLHDL 5.6 (H) 06/19/2021   Lab Results  Component Value Date   HGBA1C 6.4 (H) 05/14/2014       Assessment & Plan:   Acute bronchitis Rx flonase, tessalon pearles for cough, increase fluids, avoid nsaids but tylenol fine for pain Advised likely viral in nature to call office if no improvement in 5-7 days, fevers/chills Swabbed covid/flu/rsv  2. Atypical chest pain, ddx costochondritis Thought previously to be costochondritis in nature w/ historical neg ekg at first presentation (first documented bfp 4/22). Pt states oncologist is aware Not  reproducible to touch but only present w/ forceful cough  Advised pt to monitor for changes in presentation of pain, prolonged pain, pain w/ activity, new associated symptoms Thoracic aneurysm monitored by oncology (?) stable 9/22   I, LMikey Kirschner PA-C have reviewed all documentation for this visit. The documentation on  01/24/2022  for the exam, diagnosis, procedures, and orders are all accurate and complete.  LMikey Kirschner PA-C [facility] [place] [address]Office: [phone]: [phone]

## 2022-01-25 ENCOUNTER — Ambulatory Visit: Payer: Self-pay

## 2022-01-25 NOTE — Telephone Encounter (Signed)
°  Chief Complaint: Increased cough, chills. Thick green sputum Symptoms: cough chills - fever 100.0 Frequency: ongoing. Pertinent Negatives: Patient denies  Disposition: [] ED /[] Urgent Care (no appt availability in office) / [] Appointment(In office/virtual)/ []  [facility]/ [] Home Care/ [] Refused Recommended Disposition /[] [facility]/ [x]  Follow-up with PCP   Additional Notes: Pt seen yesterday. Last night pt had chills and is now coughing up thick green sputum.  Unsure if pt needs to be seen again.  Please advise - Pt would like a call back     Reason for Disposition  [1] Fever > 100.0 F (37.8 C) AND [2] diabetes mellitus or weak immune system (e.g., HIV positive, cancer chemo, splenectomy, organ transplant, chronic steroids)  Answer Assessment - Initial Assessment Questions 1. ONSET: "When did the cough begin?"       2. SEVERITY: "How bad is the cough today?"      Chills - did not sleep well 3. SPUTUM: "Describe the color of your sputum" (none, dry cough; clear, white, yellow, green)     GREEN - thick 4. HEMOPTYSIS: "Are you coughing up any blood?" If so ask: "How much?" (flecks, streaks, tablespoons, etc.)     na 5. DIFFICULTY BREATHING: "Are you having difficulty breathing?" If Yes, ask: "How bad is it?" (e.g., mild, moderate, severe)    - MILD: No SOB at rest, mild SOB with walking, speaks normally in sentences, can lie down, no retractions, pulse < 100.    - MODERATE: SOB at rest, SOB with minimal exertion and prefers to sit, cannot lie down flat, speaks in phrases, mild retractions, audible wheezing, pulse 100-120.    - SEVERE: Very SOB at rest, speaks in single words, struggling to breathe, sitting hunched forward, retractions, pulse > 120      mild 6. FEVER: "Do you have a fever?" If Yes, ask: "What is your temperature, how was it measured, and when did it start?"     100.0 7. CARDIAC HISTORY: "Do you have any history of heart disease?" (e.g., heart  attack, congestive heart failure)       8. LUNG HISTORY: "Do you have any history of lung disease?"  (e.g., pulmonary embolus, asthma, emphysema)     yes 9. PE RISK FACTORS: "Do you have a history of blood clots?" (or: recent major surgery, recent prolonged travel, bedridden)      10. OTHER SYMPTOMS: "Do you have any other symptoms?" (e.g., runny nose, wheezing, chest pain)        11. PREGNANCY: "Is there any chance you are pregnant?" "When was your last menstrual period?"       na 12. TRAVEL: "Have you traveled out of the country in the last month?" (e.g., travel history, exposures)  Protocols used: Cough - Acute Productive-A-AH

## 2022-01-25 NOTE — Telephone Encounter (Signed)
°  Chief Complaint: medication advice Symptoms: chest congestion Frequency:  Pertinent Negatives: Patient denies SOB Disposition: [] ED /[] Urgent Care (no appt availability in office) / [] Appointment(In office/virtual)/ []  [facility]/ [x] Home Care/ [] Refused Recommended Disposition /[] [facility]/ []  Follow-up with PCP Additional Notes: advised pt he can take Mucinex for chest congestion as well as Tylenol Sinus for sinus headache. Advised pt to call back if symptoms persist for few days. No further triage needed.    Summary: med ?   pt called in spoke with nurse earlier. I gave him the info aboiut taking tylenol for fever, but he wants to know what else he can take for chest congestion Please call back      Reason for Disposition  Health Information question, no triage required and triager able to answer question  Answer Assessment - Initial Assessment Questions 1. REASON FOR CALL or QUESTION: "What is your reason for calling today?" or "How can I best help you?" or "What question do you have that I can help answer?"     Medication for chest congestion  Protocols used: Information Only Call - No Triage-A-AH

## 2022-01-26 LAB — COVID-19, FLU A+B AND RSV
Influenza A, NAA: NOT DETECTED
Influenza B, NAA: NOT DETECTED
RSV, NAA: NOT DETECTED
SARS-CoV-2, NAA: NOT DETECTED

## 2022-01-29 NOTE — Telephone Encounter (Signed)
Chief Complaint: Blood in nasal mucus. Green sputum Symptoms: Green chest phlegm. Bloody nasal secretions. Frequency: ongoing since Thurs Pertinent Negatives: Patient denies fever, sob. Disposition: [] ED /[] Urgent Care (no appt availability in office) / [] Appointment(In office/virtual)/ []  [facility]/ [] Home Care/ [] Refused Recommended Disposition /[] [facility]/ [x]  Follow-up with PCP Additional Notes: Pt  is worried about blood mixed with mucus when he blows his nose. Has stopped nasal spray. And would like a different medication if possible. Also concerned about green sputum.   Summary: head congestion and medication   Pt states he has blood when blowing his nose/ and head congestion with darker and thicker mucus / please advise / pt asked if something can be called in for this   ----- Message from Bayard Beaver sent at 01/25/2022 11:33 AM EST -----  pt called in spoke with nurse earlier. I gave him the info aboiut taking tylenol for fever, but he wants to know what else he can take for chest congestion Please call back         Reason for Disposition  Cough with cold symptoms (e.g., runny nose, postnasal drip, throat clearing)  Common cold with no complications  Answer Assessment - Initial Assessment Questions 1. ONSET: "When did the cough begin?"      Thursday 2. SEVERITY: "How bad is the cough today?"      Not bad 3. SPUTUM: "Describe the color of your sputum" (none, dry cough; clear, white, yellow, green)     Green -  4. HEMOPTYSIS: "Are you coughing up any blood?" If so ask: "How much?" (flecks, streaks, tablespoons, etc.)     no 5. DIFFICULTY BREATHING: "Are you having difficulty breathing?" If Yes, ask: "How bad is it?" (e.g., mild, moderate, severe)    - MILD: No SOB at rest, mild SOB with walking, speaks normally in sentences, can lie down, no retractions, pulse < 100.    - MODERATE: SOB at rest, SOB with minimal exertion and prefers to sit,  cannot lie down flat, speaks in phrases, mild retractions, audible wheezing, pulse 100-120.    - SEVERE: Very SOB at rest, speaks in single words, struggling to breathe, sitting hunched forward, retractions, pulse > 120      none 6. FEVER: "Do you have a fever?" If Yes, ask: "What is your temperature, how was it measured, and when did it start?"     no 7. CARDIAC HISTORY: "Do you have any history of heart disease?" (e.g., heart attack, congestive heart failure)      no 8. LUNG HISTORY: "Do you have any history of lung disease?"  (e.g., pulmonary embolus, asthma, emphysema)     no 9. PE RISK FACTORS: "Do you have a history of blood clots?" (or: recent major surgery, recent prolonged travel, bedridden)     no 10. OTHER SYMPTOMS: "Do you have any other symptoms?" (e.g., runny nose, wheezing, chest pain)       Head congestion 11. PREGNANCY: "Is there any chance you are pregnant?" "When was your last menstrual period?"        12. TRAVEL: "Have you traveled out of the country in the last month?" (e.g., travel history, exposures)  Answer Assessment - Initial Assessment Questions 1. ONSET: "When did the nasal discharge start?"      Thursday 2. AMOUNT: "How much discharge is there?"      Moderate amount 3. COUGH: "Do you have a cough?" If yes, ask: "Describe the color of your sputum" (  clear, white, yellow, green)     Yes - green 4. RESPIRATORY DISTRESS: "Describe your breathing."      fine 5. FEVER: "Do you have a fever?" If Yes, ask: "What is your temperature, how was it measured, and when did it start?"     no 6. SEVERITY: "Overall, how bad are you feeling right now?" (e.g., doesn't interfere with normal activities, staying home from school/work, staying in bed)      ok 7. OTHER SYMPTOMS: "Do you have any other symptoms?" (e.g., sore throat, earache, wheezing, vomiting)     Blood in nasal mucus 8. PREGNANCY: "Is there any chance you are pregnant?" "When was your last menstrual period?"      na  Protocols used: Cough - Acute Productive-A-AH, Common Cold-A-AH

## 2022-01-29 NOTE — Telephone Encounter (Signed)
Pt states he has blood when blowing his nose/ and head congestion with darker and thicker mucus / please advise / pt asked if something can be called in for this   Left message to call back about symptoms.

## 2022-01-29 NOTE — Telephone Encounter (Signed)
Summary: head congestion and medication   Pt states he has blood when blowing his nose/ and head congestion with darker and thicker mucus / please advise / pt asked if something can be called in for this   ----- Message from Bayard Beaver sent at 01/25/2022 11:33 AM EST -----  pt called in spoke with nurse earlier. I gave him the info aboiut taking tylenol for fever, but he wants to know what else he can take for chest congestion Please call back

## 2022-01-30 ENCOUNTER — Other Ambulatory Visit: Payer: Self-pay

## 2022-01-30 ENCOUNTER — Ambulatory Visit: Payer: Self-pay | Admitting: *Deleted

## 2022-01-30 MED ORDER — AZITHROMYCIN 250 MG PO TABS: ORAL_TABLET | ORAL | 0 refills | Status: AC

## 2022-01-30 NOTE — Telephone Encounter (Signed)
Per agent: "Pt called in, wants toknow what azithromycin (ZITHROMAX) 250 MG tablet , he was just prescribed today, is gonna be used for? "        Advised pt of Dr. Marlan Palau note. Antibiotic due to ongoing course of symptoms.Pt verbalizes understanding. Reason for Disposition  Pharmacy calling with prescription question and triager answers question  Answer Assessment - Initial Assessment Questions 1. NAME of MEDICATION: "What medicine are you calling about?"     ZpACK 2. QUESTION: "What is your question?" (e.g., double dose of medicine, side effect)     Why prescribed 3. PRESCRIBING HCP: "Who prescribed it?" Reason: if prescribed by specialist, call should be referred to that group.     PCP  Protocols used: Medication Question Call-A-AH

## 2022-01-30 NOTE — Telephone Encounter (Signed)
Reason for Disposition  Caller has medicine question only, adult not sick, AND triager answers question  Protocols used: Medication Question Call-A-AH

## 2022-01-30 NOTE — Telephone Encounter (Signed)
Patient made aware of provider message in Triage encounter for today.

## 2022-02-02 ENCOUNTER — Encounter: Payer: Self-pay | Admitting: Internal Medicine

## 2022-02-02 ENCOUNTER — Inpatient Hospital Stay: Payer: BC Managed Care – PPO | Attending: Internal Medicine

## 2022-02-02 ENCOUNTER — Other Ambulatory Visit: Payer: Self-pay

## 2022-02-02 ENCOUNTER — Inpatient Hospital Stay: Payer: BC Managed Care – PPO

## 2022-02-02 ENCOUNTER — Inpatient Hospital Stay (HOSPITAL_BASED_OUTPATIENT_CLINIC_OR_DEPARTMENT_OTHER): Payer: BC Managed Care – PPO | Admitting: Internal Medicine

## 2022-02-02 DIAGNOSIS — C3402 Malignant neoplasm of left main bronchus: Secondary | ICD-10-CM

## 2022-02-02 DIAGNOSIS — I712 Thoracic aortic aneurysm, without rupture, unspecified: Secondary | ICD-10-CM | POA: Diagnosis not present

## 2022-02-02 DIAGNOSIS — R609 Edema, unspecified: Secondary | ICD-10-CM | POA: Insufficient documentation

## 2022-02-02 DIAGNOSIS — Z8679 Personal history of other diseases of the circulatory system: Secondary | ICD-10-CM | POA: Diagnosis not present

## 2022-02-02 DIAGNOSIS — Z806 Family history of leukemia: Secondary | ICD-10-CM | POA: Diagnosis not present

## 2022-02-02 DIAGNOSIS — Z5111 Encounter for antineoplastic chemotherapy: Secondary | ICD-10-CM | POA: Diagnosis not present

## 2022-02-02 LAB — COMPREHENSIVE METABOLIC PANEL
ALT: 19 U/L (ref 0–44)
AST: 26 U/L (ref 15–41)
Albumin: 3.8 g/dL (ref 3.5–5.0)
Alkaline Phosphatase: 47 U/L (ref 38–126)
Anion gap: 8 (ref 5–15)
BUN: 21 mg/dL — ABNORMAL HIGH (ref 6–20)
CO2: 26 mmol/L (ref 22–32)
Calcium: 9.1 mg/dL (ref 8.9–10.3)
Chloride: 100 mmol/L (ref 98–111)
Creatinine, Ser: 1.19 mg/dL (ref 0.61–1.24)
GFR, Estimated: >60 mL/min (ref >60)
Glucose, Bld: 143 mg/dL — ABNORMAL HIGH (ref 70–99)
Potassium: 3.6 mmol/L (ref 3.5–5.1)
Sodium: 134 mmol/L — ABNORMAL LOW (ref 135–145)
Total Bilirubin: 0.6 mg/dL (ref 0.3–1.2)
Total Protein: 7.5 g/dL (ref 6.5–8.1)

## 2022-02-02 LAB — CBC WITH DIFFERENTIAL/PLATELET
Abs Immature Granulocytes: 0.03 10*3/uL (ref 0.00–0.07)
Basophils Absolute: 0.1 10*3/uL (ref 0.0–0.1)
Basophils Relative: 1 %
Eosinophils Absolute: 0.3 10*3/uL (ref 0.0–0.5)
Eosinophils Relative: 4 %
HCT: 41.6 % (ref 39.0–52.0)
Hemoglobin: 14.3 g/dL (ref 13.0–17.0)
Immature Granulocytes: 0 %
Lymphocytes Relative: 28 %
Lymphs Abs: 1.9 10*3/uL (ref 0.7–4.0)
MCH: 31.6 pg (ref 26.0–34.0)
MCHC: 34.4 g/dL (ref 30.0–36.0)
MCV: 92 fL (ref 80.0–100.0)
Monocytes Absolute: 0.5 10*3/uL (ref 0.1–1.0)
Monocytes Relative: 8 %
Neutro Abs: 4.1 10*3/uL (ref 1.7–7.7)
Neutrophils Relative %: 59 %
Platelets: 194 10*3/uL (ref 150–400)
RBC: 4.52 MIL/uL (ref 4.22–5.81)
RDW: 11.9 % (ref 11.5–15.5)
WBC: 6.9 10*3/uL (ref 4.0–10.5)
nRBC: 0 % (ref 0.0–0.2)

## 2022-02-02 MED ORDER — DEXAMETHASONE SODIUM PHOSPHATE 10 MG/ML IJ SOLN
6.0000 mg | Freq: Once | INTRAMUSCULAR | Status: AC
  Administered 2022-02-02: 6 mg via INTRAVENOUS
  Filled 2022-02-02: qty 1

## 2022-02-02 MED ORDER — PROCHLORPERAZINE MALEATE 10 MG PO TABS
10.0000 mg | ORAL_TABLET | Freq: Once | ORAL | Status: AC
  Administered 2022-02-02: 10 mg via ORAL
  Filled 2022-02-02: qty 1

## 2022-02-02 MED ORDER — HEPARIN SOD (PORK) LOCK FLUSH 100 UNIT/ML IV SOLN
500.0000 [IU] | Freq: Once | INTRAVENOUS | Status: AC | PRN
  Administered 2022-02-02: 500 [IU]
  Filled 2022-02-02: qty 5

## 2022-02-02 MED ORDER — SODIUM CHLORIDE 0.9 % IV SOLN
500.0000 mg/m2 | Freq: Once | INTRAVENOUS | Status: AC
  Administered 2022-02-02: 1100 mg via INTRAVENOUS
  Filled 2022-02-02: qty 40

## 2022-02-02 MED ORDER — SODIUM CHLORIDE 0.9 % IV SOLN
Freq: Once | INTRAVENOUS | Status: AC
  Filled 2022-02-02: qty 250

## 2022-02-02 NOTE — Progress Notes (Signed)
On a z-pack day4, not feeling any better for a cold/sinus. ?

## 2022-02-02 NOTE — Progress Notes (Signed)
[facility] OFFICE PROGRESS NOTE  Patient Care Team: [name], MD as PCP - General (Family Medicine) [name], MD as Consulting Physician (Internal Medicine)   Cancer Staging  No matching staging information was found for the patient.   Oncology History Overview Note  # 2012- METASTATIC ADENO CA of LEFT LUNG [acinar pattern] s/p MED LN Bx [NEG- EGFR/K-ras/ALK mutation];[name]; [name]; CT JULY  2016- NED; JAN 2018- NED;   # AUG 2020-clinical trial closed; continue maintenance Alimta  # Thoracic aortic aneurysm-4.0 cm in 2016; currently 4.6 cm.  s/p referral to Dr. Faith Rogue.; MRI in 6 months planned.  ----------------------------------------------------------    DIAGNOSIS: [name]  STAGE: 4       ;GOALS: Palliative  CURRENT/MOST RECENT THERAPY '[ ]'  Alimta maintenance    Cancer of hilus of left lung ([place])  07/20/2016 Initial Diagnosis   Cancer of hilus of left lung ([place])   12/11/2019 -  Chemotherapy   Patient is on Treatment Plan : LUNG Pemetrexed (Alimta) q21d       INTERVAL HISTORY: Alone.  Ambulating independently.  Mitchell Mosley 61 y.o.  male pleasant patient above history of metastatic adenocarcinoma the lung-NED currently on maintenance Alimta is here for follow-up.  Patient was recently evaluated by PCP for possible URI.  Currently on Z-Pak.  Symptoms are improved.  Denies any worsening leg swelling.  Denies any worsening joint pains.  Patient stopped taking Mobic given renal dysfunction.  Currently back on Lasix/Aldactone for chronic lower extremity swelling.  Review of Systems  Constitutional:  Negative for chills, diaphoresis, fever, malaise/fatigue and weight loss.  HENT:  Negative for nosebleeds and sore throat.   Eyes:  Negative for double vision.  Respiratory:  Negative for hemoptysis, shortness of  breath and wheezing.   Cardiovascular:  Positive for leg swelling. Negative for chest pain, palpitations and orthopnea.  Gastrointestinal:  Negative for abdominal pain, blood in stool, constipation, diarrhea, heartburn, melena, nausea and vomiting.  Genitourinary:  Negative for dysuria, frequency and urgency.  Musculoskeletal:  Positive for joint pain.  Skin: Negative.  Negative for itching and rash.  Neurological:  Negative for dizziness, tingling, focal weakness, weakness and headaches.  Endo/Heme/Allergies:  Does not bruise/bleed easily.  Psychiatric/Behavioral:  Negative for depression. The patient is not nervous/anxious and does not have insomnia.      PAST MEDICAL HISTORY :  Past Medical History:  Diagnosis Date   Allergy    Lung cancer ([place])    Mild valvular heart disease Nov. 2014   per 2 D Echocardiogram done for persistent leg edema, monitored by cardiology   Peripheral edema    Thoracic aortic aneurysm (TAA)     PAST SURGICAL HISTORY :   Past Surgical History:  Procedure Laterality Date   ELBOW SURGERY Right 2011   IR CV LINE INJECTION  09/11/2021   Left anterior thoracotomy with biopsy  March 2012   PORTACATH PLACEMENT  March 2012   TONSILLECTOMY  as child    FAMILY HISTORY :   Family History  Problem Relation Age of Onset   Leukemia Father    Diabetes Mother     SOCIAL HISTORY:   Social History   Tobacco Use   Smoking status: Never   Smokeless tobacco: Never  Vaping Use   Vaping Use: Never used  Substance Use Topics   Alcohol use: Yes    Alcohol/week: 0.0  standard drinks    Comment: "social drinker"    Drug use: No    ALLERGIES:  is allergic to penicillins.  MEDICATIONS:  Current Outpatient Medications  Medication Sig Dispense Refill   azithromycin (ZITHROMAX) 250 MG tablet Take 2 tablets on day 1, then 1 tablet daily on days 2 through 5 6 tablet 0   benzonatate (TESSALON) 100 MG capsule Take 1 capsule (100 mg total) by mouth  3 (three) times daily. 20 capsule 0   fluticasone (FLONASE) 50 MCG/ACT nasal spray Place 2 sprays into both nostrils daily. 16 g 6   folic acid (FOLVITE) 1 MG tablet TAKE 1 TABLET BY MOUTH EVERY DAY 90 tablet 3   furosemide (LASIX) 20 MG tablet TAKE 1 TABLET (20 MG TOTAL) BY MOUTH DAILY AS NEEDED FOR SWELLING 90 tablet 1   HYDROcodone-acetaminophen (NORCO) 5-325 MG tablet Take 1 tablet by mouth every 6 (six) hours as needed for moderate pain. 30 tablet 0   meloxicam (MOBIC) 15 MG tablet Take 15 mg by mouth daily.     Multiple Vitamin (MULTIVITAMIN) capsule Take 1 capsule by mouth daily. Reported on 02/24/2016     NON FORMULARY Relief Factor with vitamin D and Turmeric     Omega-3 Fatty Acids (FISH OIL) 1000 MG CAPS Take 1 capsule by mouth daily. Reported on 02/24/2016     senna (SENOKOT) 8.6 MG tablet Take 1 tablet by mouth daily. Take as needed around time of chemotherapy     sildenafil (VIAGRA) 100 MG tablet TAKE 1 TABLET BY MOUTH EVERY DAY AS NEEDED FOR ERECTILE DYSFUNCTION 20 tablet 11   spironolactone (ALDACTONE) 25 MG tablet TAKE 1 TABLET BY MOUTH EVERY DAY 90 tablet 3   tadalafil (CIALIS) 20 MG tablet Take one tablet every 2 days as needed 50 tablet 5   vitamin B-12 (CYANOCOBALAMIN) 1000 MCG tablet Take 1,000 mcg by mouth daily.     No current facility-administered medications for this visit.   Facility-Administered Medications Ordered in Other Visits  Medication Dose Route Frequency Provider Last Rate Last Admin   heparin lock flush 100 unit/mL  500 Units Intravenous Once Charlaine Dalton R, MD       sodium chloride 0.9 % injection 10 mL  10 mL Intracatheter PRN [name], MD   10 mL at 05/27/15 1543   sodium chloride 0.9 % injection 10 mL  10 mL Intracatheter PRN [name], MD   10 mL at 06/17/15 1510   sodium chloride 0.9 % injection 10 mL  10 mL Intravenous PRN [name], MD   10 mL at 07/08/15 1340   sodium chloride flush (NS) 0.9 % injection 10  mL  10 mL Intravenous PRN Verlon Au, NP   10 mL at 09/30/20 1412    PHYSICAL EXAMINATION: ECOG PERFORMANCE STATUS: 0 - Asymptomatic  BP 121/75 (BP Location: Left Arm, Patient Position: Sitting, Cuff Size: Normal)    Pulse 89    Temp (!) 97.3 F (36.3 C) (Tympanic)    Ht '5\' 10"'  (1.778 m)    Wt 209 lb 9.6 oz (95.1 kg)    SpO2 100%    BMI 30.07 kg/m   Filed Weights   02/02/22 1448  Weight: 209 lb 9.6 oz (95.1 kg)    Physical Exam HENT:     Head: Normocephalic and atraumatic.     Mouth/Throat:     Pharynx: No oropharyngeal exudate.  Eyes:     Pupils: Pupils are equal, round, and reactive to light.  Cardiovascular:     Rate and Rhythm: Normal rate and regular rhythm.  Pulmonary:     Effort: No respiratory distress.     Breath sounds: No wheezing.  Abdominal:     General: Bowel sounds are normal. There is no distension.     Palpations: Abdomen is soft. There is no mass.     Tenderness: There is no abdominal tenderness. There is no guarding or rebound.  Musculoskeletal:        General: No tenderness. Normal range of motion.     Cervical back: Normal range of motion and neck supple.     Comments: Grade 1 swelling in the legs bilaterally.  Skin:    General: Skin is warm.  Neurological:     Mental Status: He is alert and oriented to person, place, and time.  Psychiatric:        Mood and Affect: Affect normal.     LABORATORY DATA:  I have reviewed the data as listed    Component Value Date/Time   NA 134 (L) 02/02/2022 1435   NA 137 06/19/2021 1121   NA 138 12/10/2014 1341   K 3.6 02/02/2022 1435   K 3.6 12/10/2014 1341   CL 100 02/02/2022 1435   CL 102 12/10/2014 1341   CO2 26 02/02/2022 1435   CO2 30 12/10/2014 1341   GLUCOSE 143 (H) 02/02/2022 1435   GLUCOSE 160 (H) 12/10/2014 1341   BUN 21 (H) 02/02/2022 1435   BUN 18 06/19/2021 1121   BUN 21 (H) 12/10/2014 1341   CREATININE 1.19 02/02/2022 1435   CREATININE 1.10 03/25/2015 1453   CALCIUM 9.1 02/02/2022  1435   CALCIUM 8.4 (L) 12/10/2014 1341   PROT 7.5 02/02/2022 1435   PROT 7.3 06/19/2021 1121   PROT 6.6 12/10/2014 1341   ALBUMIN 3.8 02/02/2022 1435   ALBUMIN 4.5 06/19/2021 1121   ALBUMIN 3.4 12/10/2014 1341   AST 26 02/02/2022 1435   AST 17 12/10/2014 1341   ALT 19 02/02/2022 1435   ALT 23 12/10/2014 1341   ALKPHOS 47 02/02/2022 1435   ALKPHOS 50 12/10/2014 1341   BILITOT 0.6 02/02/2022 1435   BILITOT 0.2 06/19/2021 1121   BILITOT 0.5 12/10/2014 1341   GFRNONAA >60 02/02/2022 1435   GFRNONAA >60 03/25/2015 1453   GFRAA >60 08/19/2020 1324   GFRAA >60 03/25/2015 1453    No results found for: SPEP, UPEP  Lab Results  Component Value Date   WBC 6.9 02/02/2022   NEUTROABS 4.1 02/02/2022   HGB 14.3 02/02/2022   HCT 41.6 02/02/2022   MCV 92.0 02/02/2022   PLT 194 02/02/2022      Chemistry      Component Value Date/Time   NA 134 (L) 02/02/2022 1435   NA 137 06/19/2021 1121   NA 138 12/10/2014 1341   K 3.6 02/02/2022 1435   K 3.6 12/10/2014 1341   CL 100 02/02/2022 1435   CL 102 12/10/2014 1341   CO2 26 02/02/2022 1435   CO2 30 12/10/2014 1341   BUN 21 (H) 02/02/2022 1435   BUN 18 06/19/2021 1121   BUN 21 (H) 12/10/2014 1341   CREATININE 1.19 02/02/2022 1435   CREATININE 1.10 03/25/2015 1453      Component Value Date/Time   CALCIUM 9.1 02/02/2022 1435   CALCIUM 8.4 (L) 12/10/2014 1341   ALKPHOS 47 02/02/2022 1435   ALKPHOS 50 12/10/2014 1341   AST 26 02/02/2022 1435   AST 17 12/10/2014 1341   ALT  19 02/02/2022 1435   ALT 23 12/10/2014 1341   BILITOT 0.6 02/02/2022 1435   BILITOT 0.2 06/19/2021 1121   BILITOT 0.5 12/10/2014 1341       RADIOGRAPHIC STUDIES: I have personally reviewed the radiological images as listed and agreed with the findings in the report. No results found.   ASSESSMENT & PLAN:  Cancer of hilus of left lung ([place]) # Adenocarcinoma the lung; stage IV;  SEP 2022- CT No evidence of recurrent or metastatic carcinoma within the chest,  abdomen, or pelvis. STABLE  # proceed with Alimta chemotherapy. Labs today reviewed;  acceptable for treatment today. Repeat CT CAP prior to next visit.   #Thoracic aneurysm-approximately 4.5 cm in size; on surveillance-stable. SEP 12th CT scan-STABLE.   #Chronic fluid retention-as needed Lasix Aldactone.  No worsening of renal function.  Creatinine stable.  # port malfunction: s/p TPA- functioning well.   b12 -2/10 q9W # DISPOSITION:  # proceed with Alimta; # Follow up in 3 weeks MD-labs- cbc/cmp; alimta; CT CAP;-Dr.B    Orders Placed This Encounter  Procedures   CT CHEST ABDOMEN PELVIS W CONTRAST    Standing Status:   Future    Standing Expiration Date:   02/03/2023    Order Specific Question:   Preferred imaging location?    Answer:   [facility]    Order Specific Question:   Radiology Contrast Protocol - do NOT remove file path    Answer:   [email or web address]     All questions were answered. The patient knows to call the clinic with any problems, questions or concerns.      [name], MD 02/02/2022 4:12 PM

## 2022-02-02 NOTE — Assessment & Plan Note (Addendum)
#   Adenocarcinoma the lung; stage IV;  SEP 2022- CT No evidence of recurrent or metastatic carcinoma within the chest, abdomen, or pelvis. STABLE ? ?# proceed with Alimta chemotherapy. Labs today reviewed;  acceptable for treatment today. Repeat CT CAP prior to next visit.  ? ?#Thoracic aneurysm-approximately 4.5 cm in size; on surveillance-stable. SEP 12th CT scan-STABLE.  ? ?#Chronic fluid retention-as needed Lasix Aldactone.  No worsening of renal function.  Creatinine stable. ? ?# port malfunction: s/p TPA- functioning well.  ? ?b12 -2/10 q9W ?# DISPOSITION:  ?# proceed with Alimta; ?# Follow up in 3 weeks MD-labs- cbc/cmp; alimta; CT CAP;-Dr.B ? ?

## 2022-02-08 DIAGNOSIS — M9903 Segmental and somatic dysfunction of lumbar region: Secondary | ICD-10-CM | POA: Diagnosis not present

## 2022-02-08 DIAGNOSIS — M546 Pain in thoracic spine: Secondary | ICD-10-CM | POA: Diagnosis not present

## 2022-02-08 DIAGNOSIS — M9902 Segmental and somatic dysfunction of thoracic region: Secondary | ICD-10-CM | POA: Diagnosis not present

## 2022-02-13 DIAGNOSIS — M9903 Segmental and somatic dysfunction of lumbar region: Secondary | ICD-10-CM | POA: Diagnosis not present

## 2022-02-13 DIAGNOSIS — M5136 Other intervertebral disc degeneration, lumbar region: Secondary | ICD-10-CM | POA: Diagnosis not present

## 2022-02-13 DIAGNOSIS — M9902 Segmental and somatic dysfunction of thoracic region: Secondary | ICD-10-CM | POA: Diagnosis not present

## 2022-02-13 DIAGNOSIS — M5432 Sciatica, left side: Secondary | ICD-10-CM | POA: Diagnosis not present

## 2022-02-14 DIAGNOSIS — M5136 Other intervertebral disc degeneration, lumbar region: Secondary | ICD-10-CM | POA: Diagnosis not present

## 2022-02-14 DIAGNOSIS — M9902 Segmental and somatic dysfunction of thoracic region: Secondary | ICD-10-CM | POA: Diagnosis not present

## 2022-02-14 DIAGNOSIS — M5432 Sciatica, left side: Secondary | ICD-10-CM | POA: Diagnosis not present

## 2022-02-14 DIAGNOSIS — M9903 Segmental and somatic dysfunction of lumbar region: Secondary | ICD-10-CM | POA: Diagnosis not present

## 2022-02-15 DIAGNOSIS — M5136 Other intervertebral disc degeneration, lumbar region: Secondary | ICD-10-CM | POA: Diagnosis not present

## 2022-02-15 DIAGNOSIS — M9903 Segmental and somatic dysfunction of lumbar region: Secondary | ICD-10-CM | POA: Diagnosis not present

## 2022-02-15 DIAGNOSIS — M9902 Segmental and somatic dysfunction of thoracic region: Secondary | ICD-10-CM | POA: Diagnosis not present

## 2022-02-15 DIAGNOSIS — M5432 Sciatica, left side: Secondary | ICD-10-CM | POA: Diagnosis not present

## 2022-02-18 ENCOUNTER — Other Ambulatory Visit: Payer: Self-pay | Admitting: Internal Medicine

## 2022-02-19 ENCOUNTER — Ambulatory Visit: Admission: RE | Admit: 2022-02-19 | Payer: BC Managed Care – PPO | Source: Ambulatory Visit

## 2022-02-19 DIAGNOSIS — M5432 Sciatica, left side: Secondary | ICD-10-CM | POA: Diagnosis not present

## 2022-02-19 DIAGNOSIS — M9902 Segmental and somatic dysfunction of thoracic region: Secondary | ICD-10-CM | POA: Diagnosis not present

## 2022-02-19 DIAGNOSIS — M9903 Segmental and somatic dysfunction of lumbar region: Secondary | ICD-10-CM | POA: Diagnosis not present

## 2022-02-19 DIAGNOSIS — M5136 Other intervertebral disc degeneration, lumbar region: Secondary | ICD-10-CM | POA: Diagnosis not present

## 2022-02-20 DIAGNOSIS — M5432 Sciatica, left side: Secondary | ICD-10-CM | POA: Diagnosis not present

## 2022-02-20 DIAGNOSIS — M9903 Segmental and somatic dysfunction of lumbar region: Secondary | ICD-10-CM | POA: Diagnosis not present

## 2022-02-20 DIAGNOSIS — M9902 Segmental and somatic dysfunction of thoracic region: Secondary | ICD-10-CM | POA: Diagnosis not present

## 2022-02-20 DIAGNOSIS — M5136 Other intervertebral disc degeneration, lumbar region: Secondary | ICD-10-CM | POA: Diagnosis not present

## 2022-02-22 DIAGNOSIS — M9902 Segmental and somatic dysfunction of thoracic region: Secondary | ICD-10-CM | POA: Diagnosis not present

## 2022-02-22 DIAGNOSIS — M9903 Segmental and somatic dysfunction of lumbar region: Secondary | ICD-10-CM | POA: Diagnosis not present

## 2022-02-22 DIAGNOSIS — M5136 Other intervertebral disc degeneration, lumbar region: Secondary | ICD-10-CM | POA: Diagnosis not present

## 2022-02-22 DIAGNOSIS — M5432 Sciatica, left side: Secondary | ICD-10-CM | POA: Diagnosis not present

## 2022-02-23 ENCOUNTER — Encounter: Payer: Self-pay | Admitting: Medical Oncology

## 2022-02-23 ENCOUNTER — Inpatient Hospital Stay: Payer: BC Managed Care – PPO

## 2022-02-23 ENCOUNTER — Other Ambulatory Visit: Payer: Self-pay

## 2022-02-23 ENCOUNTER — Inpatient Hospital Stay (HOSPITAL_BASED_OUTPATIENT_CLINIC_OR_DEPARTMENT_OTHER): Payer: BC Managed Care – PPO | Admitting: Medical Oncology

## 2022-02-23 VITALS — BP 123/75 | HR 83 | Temp 98.7°F | Resp 20 | Wt 208.6 lb

## 2022-02-23 DIAGNOSIS — Z8679 Personal history of other diseases of the circulatory system: Secondary | ICD-10-CM | POA: Diagnosis not present

## 2022-02-23 DIAGNOSIS — Z806 Family history of leukemia: Secondary | ICD-10-CM | POA: Diagnosis not present

## 2022-02-23 DIAGNOSIS — Z5111 Encounter for antineoplastic chemotherapy: Secondary | ICD-10-CM | POA: Diagnosis not present

## 2022-02-23 DIAGNOSIS — R609 Edema, unspecified: Secondary | ICD-10-CM | POA: Diagnosis not present

## 2022-02-23 DIAGNOSIS — R600 Localized edema: Secondary | ICD-10-CM

## 2022-02-23 DIAGNOSIS — C3402 Malignant neoplasm of left main bronchus: Secondary | ICD-10-CM

## 2022-02-23 DIAGNOSIS — I712 Thoracic aortic aneurysm, without rupture, unspecified: Secondary | ICD-10-CM | POA: Diagnosis not present

## 2022-02-23 DIAGNOSIS — R011 Cardiac murmur, unspecified: Secondary | ICD-10-CM

## 2022-02-23 LAB — COMPREHENSIVE METABOLIC PANEL
ALT: 15 U/L (ref 0–44)
AST: 26 U/L (ref 15–41)
Albumin: 3.8 g/dL (ref 3.5–5.0)
Alkaline Phosphatase: 45 U/L (ref 38–126)
Anion gap: 8 (ref 5–15)
BUN: 23 mg/dL — ABNORMAL HIGH (ref 6–20)
CO2: 28 mmol/L (ref 22–32)
Calcium: 9.2 mg/dL (ref 8.9–10.3)
Chloride: 100 mmol/L (ref 98–111)
Creatinine, Ser: 1.04 mg/dL (ref 0.61–1.24)
GFR, Estimated: >60 mL/min (ref >60)
Glucose, Bld: 151 mg/dL — ABNORMAL HIGH (ref 70–99)
Potassium: 3.9 mmol/L (ref 3.5–5.1)
Sodium: 136 mmol/L (ref 135–145)
Total Bilirubin: 0.7 mg/dL (ref 0.3–1.2)
Total Protein: 7 g/dL (ref 6.5–8.1)

## 2022-02-23 LAB — CBC WITH DIFFERENTIAL/PLATELET
Abs Immature Granulocytes: 0.02 10*3/uL (ref 0.00–0.07)
Basophils Absolute: 0.1 10*3/uL (ref 0.0–0.1)
Basophils Relative: 1 %
Eosinophils Absolute: 0.3 10*3/uL (ref 0.0–0.5)
Eosinophils Relative: 6 %
HCT: 39.9 % (ref 39.0–52.0)
Hemoglobin: 13.4 g/dL (ref 13.0–17.0)
Immature Granulocytes: 0 %
Lymphocytes Relative: 35 %
Lymphs Abs: 2.1 10*3/uL (ref 0.7–4.0)
MCH: 30.7 pg (ref 26.0–34.0)
MCHC: 33.6 g/dL (ref 30.0–36.0)
MCV: 91.3 fL (ref 80.0–100.0)
Monocytes Absolute: 0.4 10*3/uL (ref 0.1–1.0)
Monocytes Relative: 7 %
Neutro Abs: 3 10*3/uL (ref 1.7–7.7)
Neutrophils Relative %: 51 %
Platelets: 180 10*3/uL (ref 150–400)
RBC: 4.37 MIL/uL (ref 4.22–5.81)
RDW: 12.7 % (ref 11.5–15.5)
WBC: 5.9 10*3/uL (ref 4.0–10.5)
nRBC: 0 % (ref 0.0–0.2)

## 2022-02-23 MED ORDER — ALTEPLASE 2 MG IJ SOLR
2.0000 mg | Freq: Once | INTRAMUSCULAR | Status: AC | PRN
  Administered 2022-02-23: 2 mg
  Filled 2022-02-23: qty 2

## 2022-02-23 MED ORDER — HEPARIN SOD (PORK) LOCK FLUSH 100 UNIT/ML IV SOLN
500.0000 [IU] | Freq: Once | INTRAVENOUS | Status: AC
  Filled 2022-02-23: qty 5

## 2022-02-23 MED ORDER — SODIUM CHLORIDE 0.9 % IV SOLN
500.0000 mg/m2 | Freq: Once | INTRAVENOUS | Status: AC
  Administered 2022-02-23: 1100 mg via INTRAVENOUS
  Filled 2022-02-23: qty 40

## 2022-02-23 MED ORDER — SODIUM CHLORIDE 0.9% FLUSH
10.0000 mL | Freq: Once | INTRAVENOUS | Status: AC
  Administered 2022-02-23: 10 mL via INTRAVENOUS
  Filled 2022-02-23: qty 10

## 2022-02-23 MED ORDER — DEXAMETHASONE SODIUM PHOSPHATE 10 MG/ML IJ SOLN
6.0000 mg | Freq: Once | INTRAMUSCULAR | Status: AC
  Administered 2022-02-23: 6 mg via INTRAVENOUS
  Filled 2022-02-23: qty 1

## 2022-02-23 MED ORDER — PROCHLORPERAZINE MALEATE 10 MG PO TABS
10.0000 mg | ORAL_TABLET | Freq: Once | ORAL | Status: AC
  Administered 2022-02-23: 10 mg via ORAL
  Filled 2022-02-23: qty 1

## 2022-02-23 MED ORDER — SODIUM CHLORIDE 0.9 % IV SOLN
Freq: Once | INTRAVENOUS | Status: AC
  Filled 2022-02-23: qty 250

## 2022-02-23 MED ORDER — HEPARIN SOD (PORK) LOCK FLUSH 100 UNIT/ML IV SOLN
INTRAVENOUS | Status: AC
  Administered 2022-02-23: 500 [IU] via INTRAVENOUS
  Filled 2022-02-23: qty 5

## 2022-02-23 NOTE — Progress Notes (Signed)
TPA administered at 1324, at 30 minutes no blood was noted from port. After an additional 90 minutes, blood was noted and wasted per protocol. Treatment team updated. Per PA to proceed with with treatment. Pt updated and all question answered at this time.  ? ?Mitchell Mosley  ?

## 2022-02-23 NOTE — Patient Instructions (Signed)
[facility] CANCER CTR AT [place]  Discharge Instructions: ?Thank you for choosing [facility] to provide your oncology and hematology care.  ?If you have a lab appointment with the [facility], please go directly to the [facility] and check in at the registration area. ? ?Wear comfortable clothing and clothing appropriate for easy access to any Portacath or PICC line.  ? ?We strive to give you quality time with your provider. You may need to reschedule your appointment if you arrive late (15 or more minutes).  Arriving late affects you and other patients whose appointments are after yours.  Also, if you miss three or more appointments without notifying the office, you may be dismissed from the clinic at the provider?s discretion.    ?  ?For prescription refill requests, have your pharmacy contact our office and allow 72 hours for refills to be completed.   ? ?Today you received the following chemotherapy and/or immunotherapy agents alimta ?  ?To help prevent nausea and vomiting after your treatment, we encourage you to take your nausea medication as directed. ? ?BELOW ARE SYMPTOMS THAT SHOULD BE REPORTED IMMEDIATELY: ?*FEVER GREATER THAN 100.4 F (38 ?C) OR HIGHER ?*CHILLS OR SWEATING ?*NAUSEA AND VOMITING THAT IS NOT CONTROLLED WITH YOUR NAUSEA MEDICATION ?*UNUSUAL SHORTNESS OF BREATH ?*UNUSUAL BRUISING OR BLEEDING ?*URINARY PROBLEMS (pain or burning when urinating, or frequent urination) ?*BOWEL PROBLEMS (unusual diarrhea, constipation, pain near the anus) ?TENDERNESS IN MOUTH AND THROAT WITH OR WITHOUT PRESENCE OF ULCERS (sore throat, sores in mouth, or a toothache) ?UNUSUAL RASH, SWELLING OR PAIN  ?UNUSUAL VAGINAL DISCHARGE OR ITCHING  ? ?Items with * indicate a potential emergency and should be followed up as soon as possible or go to the Emergency Department if any problems should occur. ? ?Please show the CHEMOTHERAPY ALERT CARD or IMMUNOTHERAPY ALERT CARD at check-in to the  Emergency Department and triage nurse. ? ?Should you have questions after your visit or need to cancel or reschedule your appointment, please contact [facility] CANCER [place] AT [place]  [phone] and follow the prompts.  Office hours are 8:00 a.m. to 4:30 p.m. Monday - Friday. Please note that voicemails left after 4:00 p.m. may not be returned until the following business day.  We are closed weekends and major holidays. You have access to a nurse at all times for urgent questions. Please call the main number to the clinic [phone] and follow the prompts. ? ?For any non-urgent questions, you may also contact your provider using MyChart. We now offer e-Visits for anyone 27 and older to request care online for non-urgent symptoms. For details visit [email or web address]. ?  ?Also download the MyChart app! Go to the app store, search "MyChart", open the app, select [facility], and log in with your MyChart username and password. ? ?Due to Covid, a mask is required upon entering the hospital/clinic. If you do not have a mask, one will be given to you upon arrival. For doctor visits, patients may have 1 support person aged 31 or older with them. For treatment visits, patients cannot have anyone with them due to current Covid guidelines and our immunocompromised population.  ?

## 2022-02-23 NOTE — Progress Notes (Signed)
[facility] ?OFFICE PROGRESS NOTE ? ?Patient Care Team: ?[name], MD as PCP - General (Family Medicine) ?[name], MD as Consulting Physician (Internal Medicine) ? ? Cancer Staging  ?No matching staging information was found for the patient. ? ? ?Oncology History Overview Note  ?# 2012- METASTATIC ADENO CA of LEFT LUNG [acinar pattern] s/p MED LN Bx [NEG- EGFR/K-ras/ALK mutation];[name]; [name]; CT JULY  2016- NED; JAN 2018- NED;  ? ?# AUG 2020-clinical trial closed; continue maintenance Alimta ? ?# Thoracic aortic aneurysm-4.0 cm in 2016; currently 4.6 cm.  s/p referral to Dr. Faith Rogue.; MRI in 6 months planned. ? ?----------------------------------------------------------   ? ?DIAGNOSIS: [name] ? ?STAGE: 4       ;GOALS: Palliative ? ?CURRENT/MOST RECENT THERAPY '[ ]'  Alimta maintenance ? ?  ?Cancer of hilus of left lung ([place])  ?07/20/2016 Initial Diagnosis  ? Cancer of hilus of left lung ([place]) ?  ?12/11/2019 -  Chemotherapy  ? Patient is on Treatment Plan : LUNG Pemetrexed (Alimta) q21d   ?   ? ?INTERVAL HISTORY: Alone.  Ambulating independently. ? ?Mitchell Mosley 61 y.o.  male pleasant patient above history of metastatic adenocarcinoma the lung-NED currently on maintenance Alimta is here for follow-up. ? ?Patient was recently evaluated by PCP for possible URI.  Currently on Z-Pak.  Symptoms are improved. ? ?Denies any worsening leg swelling.  Denies any worsening joint pains.  Patient stopped taking Mobic given renal dysfunction.  Currently back on Lasix/Aldactone for chronic lower extremity swelling. ? ?Review of Systems  ?Constitutional:  Negative for chills, diaphoresis, fever, malaise/fatigue and weight loss.  ?HENT:  Negative for nosebleeds and sore throat.   ?Eyes:  Negative for double vision.  ?Respiratory:  Negative for hemoptysis, shortness of  breath and wheezing.   ?Cardiovascular:  Positive for leg swelling. Negative for chest pain, palpitations and orthopnea.  ?Gastrointestinal:  Negative for abdominal pain, blood in stool, constipation, diarrhea, heartburn, melena, nausea and vomiting.  ?Genitourinary:  Negative for dysuria, frequency and urgency.  ?Musculoskeletal:  Negative for joint pain.  ?Skin: Negative.  Negative for itching and rash.  ?Neurological:  Negative for dizziness, tingling, focal weakness, weakness and headaches.  ?Endo/Heme/Allergies:  Does not bruise/bleed easily.  ?Psychiatric/Behavioral:  Negative for depression. The patient is not nervous/anxious and does not have insomnia.   ?  ? ?PAST MEDICAL HISTORY :  ?Past Medical History:  ?Diagnosis Date  ? Allergy   ? Lung cancer ([place])   ? Mild valvular heart disease Nov. 2014  ? per 2 D Echocardiogram done for persistent leg edema, monitored by cardiology  ? Peripheral edema   ? Thoracic aortic aneurysm (TAA)   ? ? ?PAST SURGICAL HISTORY :   ?Past Surgical History:  ?Procedure Laterality Date  ? ELBOW SURGERY Right 2011  ? IR CV LINE INJECTION  09/11/2021  ? Left anterior thoracotomy with biopsy  March 2012  ? PORTACATH PLACEMENT  March 2012  ? TONSILLECTOMY  as child  ? ? ?FAMILY HISTORY :   ?Family History  ?Problem Relation Age of Onset  ? Leukemia Father   ? Diabetes Mother   ? ? ?SOCIAL HISTORY:   ?Social History  ? ?Tobacco Use  ? Smoking status: Never  ? Smokeless tobacco: Never  ?Vaping Use  ? Vaping Use: Never used  ?Substance Use Topics  ? Alcohol use: Yes  ?  Alcohol/week: 0.0  standard drinks  ?  Comment: "social drinker"   ? Drug use: No  ? ? ?ALLERGIES:  is allergic to penicillins. ? ?MEDICATIONS:  ?Current Outpatient Medications  ?Medication Sig Dispense Refill  ? benzonatate (TESSALON) 100 MG capsule Take 1 capsule (100 mg total) by mouth 3 (three) times daily. 20 capsule 0  ? fluticasone (FLONASE) 50 MCG/ACT nasal spray Place 2 sprays into both nostrils daily. 16 g 6  ?  folic acid (FOLVITE) 1 MG tablet TAKE 1 TABLET BY MOUTH EVERY DAY 90 tablet 3  ? furosemide (LASIX) 20 MG tablet TAKE 1 TABLET (20 MG TOTAL) BY MOUTH DAILY AS NEEDED FOR SWELLING 90 tablet 1  ? HYDROcodone-acetaminophen (NORCO) 5-325 MG tablet Take 1 tablet by mouth every 6 (six) hours as needed for moderate pain. 30 tablet 0  ? meloxicam (MOBIC) 15 MG tablet Take 15 mg by mouth daily.    ? Multiple Vitamin (MULTIVITAMIN) capsule Take 1 capsule by mouth daily. Reported on 02/24/2016    ? NON FORMULARY Relief Factor with vitamin D and Turmeric    ? Omega-3 Fatty Acids (FISH OIL) 1000 MG CAPS Take 1 capsule by mouth daily. Reported on 02/24/2016    ? senna (SENOKOT) 8.6 MG tablet Take 1 tablet by mouth daily. Take as needed around time of chemotherapy    ? sildenafil (VIAGRA) 100 MG tablet TAKE 1 TABLET BY MOUTH EVERY DAY AS NEEDED FOR ERECTILE DYSFUNCTION 20 tablet 11  ? spironolactone (ALDACTONE) 25 MG tablet TAKE 1 TABLET BY MOUTH EVERY DAY 90 tablet 3  ? tadalafil (CIALIS) 20 MG tablet Take one tablet every 2 days as needed 50 tablet 5  ? vitamin B-12 (CYANOCOBALAMIN) 1000 MCG tablet Take 1,000 mcg by mouth daily.    ? ?No current facility-administered medications for this visit.  ? ?Facility-Administered Medications Ordered in Other Visits  ?Medication Dose Route Frequency Provider Last Rate Last Admin  ? heparin lock flush 100 unit/mL  500 Units Intravenous Once Charlaine Dalton R, MD      ? heparin lock flush 100 unit/mL  500 Units Intravenous Once Charlaine Dalton R, MD      ? sodium chloride 0.9 % injection 10 mL  10 mL Intracatheter PRN [name], MD   10 mL at 05/27/15 1543  ? sodium chloride 0.9 % injection 10 mL  10 mL Intracatheter PRN [name], MD   10 mL at 06/17/15 1510  ? sodium chloride 0.9 % injection 10 mL  10 mL Intravenous PRN [name], MD   10 mL at 07/08/15 1340  ? sodium chloride flush (NS) 0.9 % injection 10 mL  10 mL Intravenous PRN Verlon Au, NP   10 mL at  09/30/20 1412  ? ? ?PHYSICAL EXAMINATION: ?ECOG PERFORMANCE STATUS: 0 - Asymptomatic ? ?BP 123/75   Pulse 83   Temp 98.7 ?F (37.1 ?C)   Resp 20   Wt 208 lb 9.6 oz (94.6 kg)   SpO2 98%   BMI 29.93 kg/m?  ? Danley Danker Weights  ? 02/23/22 1333  ?Weight: 208 lb 9.6 oz (94.6 kg)  ? ? ?Physical Exam ?HENT:  ?   Head: Normocephalic and atraumatic.  ?   Mouth/Throat:  ?   Pharynx: No oropharyngeal exudate.  ?Eyes:  ?   Pupils: Pupils are equal, round, and reactive to light.  ?Cardiovascular:  ?   Rate and Rhythm: Normal rate and regular rhythm.  ?   Heart sounds: Murmur (3/5 systolic murmur at Right sternal  boarder) heard.  ?Pulmonary:  ?   Effort: No respiratory distress.  ?   Breath sounds: No wheezing.  ?Abdominal:  ?   General: Bowel sounds are normal. There is no distension.  ?   Palpations: Abdomen is soft. There is no mass.  ?   Tenderness: There is no abdominal tenderness. There is no guarding or rebound.  ?Musculoskeletal:     ?   General: No tenderness. Normal range of motion.  ?   Cervical back: Normal range of motion and neck supple.  ?   Right lower leg: No edema.  ?   Left lower leg: No edema.  ?   Comments: Grade 1 swelling in the legs bilaterally.  ?Skin: ?   General: Skin is warm.  ?   Coloration: Skin is not pale.  ?   Findings: No bruising or rash.  ?Neurological:  ?   Mental Status: He is alert and oriented to person, place, and time.  ?Psychiatric:     ?   Mood and Affect: Affect normal.  ? ? ? ?LABORATORY DATA:  ?I have reviewed the data as listed ?   ?Component Value Date/Time  ? NA 134 (L) 02/02/2022 1435  ? NA 137 06/19/2021 1121  ? NA 138 12/10/2014 1341  ? K 3.6 02/02/2022 1435  ? K 3.6 12/10/2014 1341  ? CL 100 02/02/2022 1435  ? CL 102 12/10/2014 1341  ? CO2 26 02/02/2022 1435  ? CO2 30 12/10/2014 1341  ? GLUCOSE 143 (H) 02/02/2022 1435  ? GLUCOSE 160 (H) 12/10/2014 1341  ? BUN 21 (H) 02/02/2022 1435  ? BUN 18 06/19/2021 1121  ? BUN 21 (H) 12/10/2014 1341  ? CREATININE 1.19 02/02/2022 1435  ?  CREATININE 1.10 03/25/2015 1453  ? CALCIUM 9.1 02/02/2022 1435  ? CALCIUM 8.4 (L) 12/10/2014 1341  ? PROT 7.5 02/02/2022 1435  ? PROT 7.3 06/19/2021 1121  ? PROT 6.6 12/10/2014 1341  ? ALBUMIN 3.8 02/02/2022 1435  ?

## 2022-02-26 DIAGNOSIS — M9903 Segmental and somatic dysfunction of lumbar region: Secondary | ICD-10-CM | POA: Diagnosis not present

## 2022-02-26 DIAGNOSIS — M5432 Sciatica, left side: Secondary | ICD-10-CM | POA: Diagnosis not present

## 2022-02-26 DIAGNOSIS — M5136 Other intervertebral disc degeneration, lumbar region: Secondary | ICD-10-CM | POA: Diagnosis not present

## 2022-02-26 DIAGNOSIS — M9902 Segmental and somatic dysfunction of thoracic region: Secondary | ICD-10-CM | POA: Diagnosis not present

## 2022-02-27 DIAGNOSIS — M9902 Segmental and somatic dysfunction of thoracic region: Secondary | ICD-10-CM | POA: Diagnosis not present

## 2022-02-27 DIAGNOSIS — M5136 Other intervertebral disc degeneration, lumbar region: Secondary | ICD-10-CM | POA: Diagnosis not present

## 2022-02-27 DIAGNOSIS — M5432 Sciatica, left side: Secondary | ICD-10-CM | POA: Diagnosis not present

## 2022-02-27 DIAGNOSIS — M9903 Segmental and somatic dysfunction of lumbar region: Secondary | ICD-10-CM | POA: Diagnosis not present

## 2022-03-01 DIAGNOSIS — M9902 Segmental and somatic dysfunction of thoracic region: Secondary | ICD-10-CM | POA: Diagnosis not present

## 2022-03-01 DIAGNOSIS — M5432 Sciatica, left side: Secondary | ICD-10-CM | POA: Diagnosis not present

## 2022-03-01 DIAGNOSIS — M9903 Segmental and somatic dysfunction of lumbar region: Secondary | ICD-10-CM | POA: Diagnosis not present

## 2022-03-01 DIAGNOSIS — M5136 Other intervertebral disc degeneration, lumbar region: Secondary | ICD-10-CM | POA: Diagnosis not present

## 2022-03-05 DIAGNOSIS — M5136 Other intervertebral disc degeneration, lumbar region: Secondary | ICD-10-CM | POA: Diagnosis not present

## 2022-03-05 DIAGNOSIS — M9902 Segmental and somatic dysfunction of thoracic region: Secondary | ICD-10-CM | POA: Diagnosis not present

## 2022-03-05 DIAGNOSIS — M9903 Segmental and somatic dysfunction of lumbar region: Secondary | ICD-10-CM | POA: Diagnosis not present

## 2022-03-05 DIAGNOSIS — M5432 Sciatica, left side: Secondary | ICD-10-CM | POA: Diagnosis not present

## 2022-03-06 DIAGNOSIS — M5136 Other intervertebral disc degeneration, lumbar region: Secondary | ICD-10-CM | POA: Diagnosis not present

## 2022-03-06 DIAGNOSIS — M9902 Segmental and somatic dysfunction of thoracic region: Secondary | ICD-10-CM | POA: Diagnosis not present

## 2022-03-06 DIAGNOSIS — M9903 Segmental and somatic dysfunction of lumbar region: Secondary | ICD-10-CM | POA: Diagnosis not present

## 2022-03-06 DIAGNOSIS — M5432 Sciatica, left side: Secondary | ICD-10-CM | POA: Diagnosis not present

## 2022-03-08 DIAGNOSIS — M9903 Segmental and somatic dysfunction of lumbar region: Secondary | ICD-10-CM | POA: Diagnosis not present

## 2022-03-08 DIAGNOSIS — M5432 Sciatica, left side: Secondary | ICD-10-CM | POA: Diagnosis not present

## 2022-03-08 DIAGNOSIS — M5136 Other intervertebral disc degeneration, lumbar region: Secondary | ICD-10-CM | POA: Diagnosis not present

## 2022-03-08 DIAGNOSIS — M9902 Segmental and somatic dysfunction of thoracic region: Secondary | ICD-10-CM | POA: Diagnosis not present

## 2022-03-13 DIAGNOSIS — M5432 Sciatica, left side: Secondary | ICD-10-CM | POA: Diagnosis not present

## 2022-03-13 DIAGNOSIS — M9902 Segmental and somatic dysfunction of thoracic region: Secondary | ICD-10-CM | POA: Diagnosis not present

## 2022-03-13 DIAGNOSIS — M9903 Segmental and somatic dysfunction of lumbar region: Secondary | ICD-10-CM | POA: Diagnosis not present

## 2022-03-13 DIAGNOSIS — M5136 Other intervertebral disc degeneration, lumbar region: Secondary | ICD-10-CM | POA: Diagnosis not present

## 2022-03-14 DIAGNOSIS — M9903 Segmental and somatic dysfunction of lumbar region: Secondary | ICD-10-CM | POA: Diagnosis not present

## 2022-03-14 DIAGNOSIS — M9902 Segmental and somatic dysfunction of thoracic region: Secondary | ICD-10-CM | POA: Diagnosis not present

## 2022-03-14 DIAGNOSIS — M5432 Sciatica, left side: Secondary | ICD-10-CM | POA: Diagnosis not present

## 2022-03-14 DIAGNOSIS — M5136 Other intervertebral disc degeneration, lumbar region: Secondary | ICD-10-CM | POA: Diagnosis not present

## 2022-03-16 ENCOUNTER — Inpatient Hospital Stay (HOSPITAL_BASED_OUTPATIENT_CLINIC_OR_DEPARTMENT_OTHER): Payer: BC Managed Care – PPO | Admitting: Internal Medicine

## 2022-03-16 ENCOUNTER — Encounter: Payer: Self-pay | Admitting: Internal Medicine

## 2022-03-16 ENCOUNTER — Inpatient Hospital Stay: Payer: BC Managed Care – PPO

## 2022-03-16 ENCOUNTER — Inpatient Hospital Stay: Payer: BC Managed Care – PPO | Attending: Internal Medicine

## 2022-03-16 DIAGNOSIS — C3402 Malignant neoplasm of left main bronchus: Secondary | ICD-10-CM

## 2022-03-16 DIAGNOSIS — Z8679 Personal history of other diseases of the circulatory system: Secondary | ICD-10-CM | POA: Insufficient documentation

## 2022-03-16 DIAGNOSIS — Z5111 Encounter for antineoplastic chemotherapy: Secondary | ICD-10-CM

## 2022-03-16 DIAGNOSIS — R609 Edema, unspecified: Secondary | ICD-10-CM | POA: Insufficient documentation

## 2022-03-16 DIAGNOSIS — Z806 Family history of leukemia: Secondary | ICD-10-CM | POA: Diagnosis not present

## 2022-03-16 DIAGNOSIS — I712 Thoracic aortic aneurysm, without rupture, unspecified: Secondary | ICD-10-CM | POA: Diagnosis not present

## 2022-03-16 DIAGNOSIS — D696 Thrombocytopenia, unspecified: Secondary | ICD-10-CM

## 2022-03-16 LAB — CBC WITH DIFFERENTIAL/PLATELET
Abs Immature Granulocytes: 0.02 10*3/uL (ref 0.00–0.07)
Basophils Absolute: 0.1 10*3/uL (ref 0.0–0.1)
Basophils Relative: 1 %
Eosinophils Absolute: 0.1 10*3/uL (ref 0.0–0.5)
Eosinophils Relative: 2 %
HCT: 42.6 % (ref 39.0–52.0)
Hemoglobin: 14.6 g/dL (ref 13.0–17.0)
Immature Granulocytes: 0 %
Lymphocytes Relative: 40 %
Lymphs Abs: 2.1 10*3/uL (ref 0.7–4.0)
MCH: 31.4 pg (ref 26.0–34.0)
MCHC: 34.3 g/dL (ref 30.0–36.0)
MCV: 91.6 fL (ref 80.0–100.0)
Monocytes Absolute: 0.5 10*3/uL (ref 0.1–1.0)
Monocytes Relative: 10 %
Neutro Abs: 2.4 10*3/uL (ref 1.7–7.7)
Neutrophils Relative %: 47 %
Platelets: 169 10*3/uL (ref 150–400)
RBC: 4.65 MIL/uL (ref 4.22–5.81)
RDW: 13.4 % (ref 11.5–15.5)
WBC: 5.3 10*3/uL (ref 4.0–10.5)
nRBC: 0 % (ref 0.0–0.2)

## 2022-03-16 LAB — COMPREHENSIVE METABOLIC PANEL
ALT: 17 U/L (ref 0–44)
AST: 25 U/L (ref 15–41)
Albumin: 4.2 g/dL (ref 3.5–5.0)
Alkaline Phosphatase: 53 U/L (ref 38–126)
Anion gap: 7 (ref 5–15)
BUN: 17 mg/dL (ref 6–20)
CO2: 28 mmol/L (ref 22–32)
Calcium: 9.4 mg/dL (ref 8.9–10.3)
Chloride: 100 mmol/L (ref 98–111)
Creatinine, Ser: 0.95 mg/dL (ref 0.61–1.24)
GFR, Estimated: >60 mL/min (ref >60)
Glucose, Bld: 151 mg/dL — ABNORMAL HIGH (ref 70–99)
Potassium: 3.7 mmol/L (ref 3.5–5.1)
Sodium: 135 mmol/L (ref 135–145)
Total Bilirubin: 0.9 mg/dL (ref 0.3–1.2)
Total Protein: 7.6 g/dL (ref 6.5–8.1)

## 2022-03-16 MED ORDER — SODIUM CHLORIDE 0.9 % IV SOLN
Freq: Once | INTRAVENOUS | Status: AC
  Filled 2022-03-16: qty 250

## 2022-03-16 MED ORDER — CYANOCOBALAMIN 1000 MCG/ML IJ SOLN
1000.0000 ug | Freq: Once | INTRAMUSCULAR | Status: AC
  Administered 2022-03-16: 1000 ug via INTRAMUSCULAR
  Filled 2022-03-16: qty 1

## 2022-03-16 MED ORDER — HEPARIN SOD (PORK) LOCK FLUSH 100 UNIT/ML IV SOLN
INTRAVENOUS | Status: AC
  Administered 2022-03-16: 500 [IU]
  Filled 2022-03-16: qty 5

## 2022-03-16 MED ORDER — HEPARIN SOD (PORK) LOCK FLUSH 100 UNIT/ML IV SOLN
500.0000 [IU] | Freq: Once | INTRAVENOUS | Status: AC | PRN
  Filled 2022-03-16: qty 5

## 2022-03-16 MED ORDER — SODIUM CHLORIDE 0.9 % IV SOLN
500.0000 mg/m2 | Freq: Once | INTRAVENOUS | Status: AC
  Administered 2022-03-16: 1100 mg via INTRAVENOUS
  Filled 2022-03-16: qty 40

## 2022-03-16 MED ORDER — DEXAMETHASONE SODIUM PHOSPHATE 10 MG/ML IJ SOLN
6.0000 mg | Freq: Once | INTRAMUSCULAR | Status: AC
  Administered 2022-03-16: 6 mg via INTRAVENOUS
  Filled 2022-03-16: qty 1

## 2022-03-16 MED ORDER — PROCHLORPERAZINE MALEATE 10 MG PO TABS
10.0000 mg | ORAL_TABLET | Freq: Once | ORAL | Status: AC
  Administered 2022-03-16: 10 mg via ORAL
  Filled 2022-03-16: qty 1

## 2022-03-16 NOTE — Patient Instructions (Signed)
[facility] CANCER CTR AT [place]  Discharge Instructions: ?Thank you for choosing [facility] to provide your oncology and hematology care.  ?If you have a lab appointment with the [facility], please go directly to the [facility] and check in at the registration area. ? ?Wear comfortable clothing and clothing appropriate for easy access to any Portacath or PICC line.  ? ?We strive to give you quality time with your provider. You may need to reschedule your appointment if you arrive late (15 or more minutes).  Arriving late affects you and other patients whose appointments are after yours.  Also, if you miss three or more appointments without notifying the office, you may be dismissed from the clinic at the provider?s discretion.    ?  ?For prescription refill requests, have your pharmacy contact our office and allow 72 hours for refills to be completed.   ? ?Today you received the following chemotherapy and/or immunotherapy agents Alimta  ?  ?To help prevent nausea and vomiting after your treatment, we encourage you to take your nausea medication as directed. ? ?BELOW ARE SYMPTOMS THAT SHOULD BE REPORTED IMMEDIATELY: ?*FEVER GREATER THAN 100.4 F (38 ?C) OR HIGHER ?*CHILLS OR SWEATING ?*NAUSEA AND VOMITING THAT IS NOT CONTROLLED WITH YOUR NAUSEA MEDICATION ?*UNUSUAL SHORTNESS OF BREATH ?*UNUSUAL BRUISING OR BLEEDING ?*URINARY PROBLEMS (pain or burning when urinating, or frequent urination) ?*BOWEL PROBLEMS (unusual diarrhea, constipation, pain near the anus) ?TENDERNESS IN MOUTH AND THROAT WITH OR WITHOUT PRESENCE OF ULCERS (sore throat, sores in mouth, or a toothache) ?UNUSUAL RASH, SWELLING OR PAIN  ?UNUSUAL VAGINAL DISCHARGE OR ITCHING  ? ?Items with * indicate a potential emergency and should be followed up as soon as possible or go to the Emergency Department if any problems should occur. ? ?Please show the CHEMOTHERAPY ALERT CARD or IMMUNOTHERAPY ALERT CARD at check-in to the  Emergency Department and triage nurse. ? ?Should you have questions after your visit or need to cancel or reschedule your appointment, please contact [facility] CANCER [place] AT [place]  [phone] and follow the prompts.  Office hours are 8:00 a.m. to 4:30 p.m. Monday - Friday. Please note that voicemails left after 4:00 p.m. may not be returned until the following business day.  We are closed weekends and major holidays. You have access to a nurse at all times for urgent questions. Please call the main number to the clinic [phone] and follow the prompts. ? ?For any non-urgent questions, you may also contact your provider using MyChart. We now offer e-Visits for anyone 33 and older to request care online for non-urgent symptoms. For details visit [email or web address]. ?  ?Also download the MyChart app! Go to the app store, search "MyChart", open the app, select [facility], and log in with your MyChart username and password. ? ?Due to Covid, a mask is required upon entering the hospital/clinic. If you do not have a mask, one will be given to you upon arrival. For doctor visits, patients may have 1 support person aged 44 or older with them. For treatment visits, patients cannot have anyone with them due to current Covid guidelines and our immunocompromised population.  ?

## 2022-03-16 NOTE — Assessment & Plan Note (Addendum)
#   Adenocarcinoma the lung; stage IV;  SEP 2022- CT No evidence of recurrent or metastatic carcinoma within the chest, abdomen, or pelvis. STABLE ? ?# proceed with Alimta chemotherapy. Labs today reviewed;  acceptable for treatment today. Repeat CT CAP prior to next visit [pt awaiting for deductibles].  ? ?# Thoracic aneurysm-approximately 4.5 cm in size; on surveillance-stable. SEP 12th CT scan-STABLE.  ? ?#Chronic fluid retention-as needed Lasix Aldactone.  No worsening of renal function.  Creatinine stable. ? ?# port malfunction: s/p TPA- functioning well.  ? ?b12 -2/10 q9W ? ?# DISPOSITION:  ?# proceed with Alimta; ?# Follow up in 3 weeks MD-labs- cbc/cmp; alimta; -Dr.B ? ?

## 2022-03-16 NOTE — Progress Notes (Signed)
I connected with CHANAN DETWILER on 03/06/2022  1:00 PM EDT by video enabled telemedicine visit and verified that I am speaking with the correct person using two identifiers.  ?I discussed the limitations, risks, security and privacy concerns of performing an evaluation and management service by telemedicine and the availability of in-person appointments. I also discussed with the patient that there may be a patient responsible charge related to this service. The patient expressed understanding and agreed to proceed.  ? ? ?Other persons participating in the visit and their role in the encounter: RN/medical reconciliation ?Patient?s location: office ?Provider?s location: home ? ?Oncology History Overview Note  ?# 2012- METASTATIC ADENO CA of LEFT LUNG [acinar pattern] s/p MED LN Bx [NEG- EGFR/K-ras/ALK mutation];[name]; [name]; CT JULY  2016- NED; JAN 2018- NED;  ? ?# AUG 2020-clinical trial closed; continue maintenance Alimta ? ?# Thoracic aortic aneurysm-4.0 cm in 2016; currently 4.6 cm.  s/p referral to Dr. Faith Rogue.; MRI in 6 months planned. ? ?----------------------------------------------------------   ? ?DIAGNOSIS: Mitchell.Mosley ] Adenocarcinoma lung ? ?STAGE: 4       ;GOALS: Palliative ? ?CURRENT/MOST RECENT THERAPY _0  Alimta maintenance ? ?  ?Cancer of hilus of left lung ([place])  ?07/20/2016 Initial Diagnosis  ? Cancer of hilus of left lung ([place]) ? ?  ?12/11/2019 -  Chemotherapy  ? Patient is on Treatment Plan : LUNG Pemetrexed (Alimta) q21d   ? ?  ?  ? ? ? ?Chief Complaint: lung cancer  ? ? ?History of present illness:Mitchell Mosley 61 y.o.  male with history of metastatic lung cancer currently NED on maintenance Alimta is here for follow-up. ? ?Patient denies any worsening shortness of breath or cough but denies any fevers or chills.  No headaches.  No nausea no vomiting.  No worsening swelling in the legs.  Mild  arthritic pain not any worse.  ? ?Observation/objective: Alert & oriented x 3. In No acute distress.  ? ?Assessment and plan: ?Cancer of hilus of left lung ([place]) ?# Adenocarcinoma the lung; stage IV;  SEP 2022- CT No evidence of recurrent or metastatic carcinoma within the chest, abdomen, or pelvis. STABLE ? ?# proceed with Alimta chemotherapy. Labs today reviewed;  acceptable for treatment today. Repeat CT CAP prior to next visit [pt awaiting for deductibles].  ? ?# Thoracic aneurysm-approximately 4.5 cm in size; on surveillance-stable. SEP 12th CT scan-STABLE.  ? ?#Chronic fluid retention-as needed Lasix Aldactone.  No worsening of renal function.  Creatinine stable. ? ?# port malfunction: s/p TPA- functioning well.  ? ?b12 -2/10 q9W ? ?# DISPOSITION:  ?# proceed with Alimta; ?# Follow up in 3 weeks MD-labs- cbc/cmp; alimta; -Dr.B ? ? ? ?Follow-up instructions: ? ?I discussed the assessment and treatment plan with the patient.  The patient was provided an opportunity to ask questions and all were answered.  The patient agreed with the plan and demonstrated understanding of instructions. ? ?The patient was advised to call back or seek an in person evaluation if the symptoms worsen or if the condition fails to improve as anticipated. ? ? ? ?Dr. Charlaine Dalton ?[facility] ?03/19/2022 ?11:36 AM ?

## 2022-03-19 ENCOUNTER — Encounter: Payer: Self-pay | Admitting: Internal Medicine

## 2022-03-20 DIAGNOSIS — M9903 Segmental and somatic dysfunction of lumbar region: Secondary | ICD-10-CM | POA: Diagnosis not present

## 2022-03-20 DIAGNOSIS — M5136 Other intervertebral disc degeneration, lumbar region: Secondary | ICD-10-CM | POA: Diagnosis not present

## 2022-03-20 DIAGNOSIS — M9902 Segmental and somatic dysfunction of thoracic region: Secondary | ICD-10-CM | POA: Diagnosis not present

## 2022-03-20 DIAGNOSIS — M5432 Sciatica, left side: Secondary | ICD-10-CM | POA: Diagnosis not present

## 2022-03-21 ENCOUNTER — Other Ambulatory Visit: Payer: Self-pay | Admitting: Internal Medicine

## 2022-03-21 DIAGNOSIS — C3402 Malignant neoplasm of left main bronchus: Secondary | ICD-10-CM

## 2022-03-22 DIAGNOSIS — M9902 Segmental and somatic dysfunction of thoracic region: Secondary | ICD-10-CM | POA: Diagnosis not present

## 2022-03-22 DIAGNOSIS — M9903 Segmental and somatic dysfunction of lumbar region: Secondary | ICD-10-CM | POA: Diagnosis not present

## 2022-03-22 DIAGNOSIS — M5432 Sciatica, left side: Secondary | ICD-10-CM | POA: Diagnosis not present

## 2022-03-22 DIAGNOSIS — M5136 Other intervertebral disc degeneration, lumbar region: Secondary | ICD-10-CM | POA: Diagnosis not present

## 2022-03-27 DIAGNOSIS — M5432 Sciatica, left side: Secondary | ICD-10-CM | POA: Diagnosis not present

## 2022-03-27 DIAGNOSIS — M9903 Segmental and somatic dysfunction of lumbar region: Secondary | ICD-10-CM | POA: Diagnosis not present

## 2022-03-27 DIAGNOSIS — M5136 Other intervertebral disc degeneration, lumbar region: Secondary | ICD-10-CM | POA: Diagnosis not present

## 2022-03-27 DIAGNOSIS — M9902 Segmental and somatic dysfunction of thoracic region: Secondary | ICD-10-CM | POA: Diagnosis not present

## 2022-03-29 DIAGNOSIS — M5432 Sciatica, left side: Secondary | ICD-10-CM | POA: Diagnosis not present

## 2022-03-29 DIAGNOSIS — M9903 Segmental and somatic dysfunction of lumbar region: Secondary | ICD-10-CM | POA: Diagnosis not present

## 2022-03-29 DIAGNOSIS — M9902 Segmental and somatic dysfunction of thoracic region: Secondary | ICD-10-CM | POA: Diagnosis not present

## 2022-03-29 DIAGNOSIS — M5136 Other intervertebral disc degeneration, lumbar region: Secondary | ICD-10-CM | POA: Diagnosis not present

## 2022-04-03 DIAGNOSIS — M5136 Other intervertebral disc degeneration, lumbar region: Secondary | ICD-10-CM | POA: Diagnosis not present

## 2022-04-03 DIAGNOSIS — M9902 Segmental and somatic dysfunction of thoracic region: Secondary | ICD-10-CM | POA: Diagnosis not present

## 2022-04-03 DIAGNOSIS — M9903 Segmental and somatic dysfunction of lumbar region: Secondary | ICD-10-CM | POA: Diagnosis not present

## 2022-04-03 DIAGNOSIS — M5432 Sciatica, left side: Secondary | ICD-10-CM | POA: Diagnosis not present

## 2022-04-06 ENCOUNTER — Inpatient Hospital Stay: Payer: BC Managed Care – PPO

## 2022-04-06 ENCOUNTER — Ambulatory Visit: Payer: BC Managed Care – PPO

## 2022-04-06 ENCOUNTER — Encounter: Payer: Self-pay | Admitting: Internal Medicine

## 2022-04-06 ENCOUNTER — Other Ambulatory Visit: Payer: BC Managed Care – PPO

## 2022-04-06 ENCOUNTER — Ambulatory Visit: Payer: BC Managed Care – PPO | Admitting: Internal Medicine

## 2022-04-06 ENCOUNTER — Inpatient Hospital Stay: Payer: BC Managed Care – PPO | Attending: Internal Medicine

## 2022-04-06 ENCOUNTER — Inpatient Hospital Stay (HOSPITAL_BASED_OUTPATIENT_CLINIC_OR_DEPARTMENT_OTHER): Payer: BC Managed Care – PPO | Admitting: Internal Medicine

## 2022-04-06 DIAGNOSIS — M7989 Other specified soft tissue disorders: Secondary | ICD-10-CM | POA: Insufficient documentation

## 2022-04-06 DIAGNOSIS — C3402 Malignant neoplasm of left main bronchus: Secondary | ICD-10-CM

## 2022-04-06 DIAGNOSIS — I712 Thoracic aortic aneurysm, without rupture, unspecified: Secondary | ICD-10-CM | POA: Insufficient documentation

## 2022-04-06 DIAGNOSIS — Z806 Family history of leukemia: Secondary | ICD-10-CM | POA: Insufficient documentation

## 2022-04-06 DIAGNOSIS — Z8679 Personal history of other diseases of the circulatory system: Secondary | ICD-10-CM | POA: Insufficient documentation

## 2022-04-06 DIAGNOSIS — Z5111 Encounter for antineoplastic chemotherapy: Secondary | ICD-10-CM | POA: Diagnosis not present

## 2022-04-06 DIAGNOSIS — Z79899 Other long term (current) drug therapy: Secondary | ICD-10-CM | POA: Diagnosis not present

## 2022-04-06 DIAGNOSIS — R609 Edema, unspecified: Secondary | ICD-10-CM | POA: Diagnosis not present

## 2022-04-06 LAB — CBC WITH DIFFERENTIAL/PLATELET
Abs Immature Granulocytes: 0.02 10*3/uL (ref 0.00–0.07)
Basophils Absolute: 0 10*3/uL (ref 0.0–0.1)
Basophils Relative: 1 %
Eosinophils Absolute: 0.2 10*3/uL (ref 0.0–0.5)
Eosinophils Relative: 4 %
HCT: 43.3 % (ref 39.0–52.0)
Hemoglobin: 14.7 g/dL (ref 13.0–17.0)
Immature Granulocytes: 0 %
Lymphocytes Relative: 40 %
Lymphs Abs: 2.1 10*3/uL (ref 0.7–4.0)
MCH: 31.5 pg (ref 26.0–34.0)
MCHC: 33.9 g/dL (ref 30.0–36.0)
MCV: 92.9 fL (ref 80.0–100.0)
Monocytes Absolute: 0.4 10*3/uL (ref 0.1–1.0)
Monocytes Relative: 9 %
Neutro Abs: 2.4 10*3/uL (ref 1.7–7.7)
Neutrophils Relative %: 46 %
Platelets: 167 10*3/uL (ref 150–400)
RBC: 4.66 MIL/uL (ref 4.22–5.81)
RDW: 13.8 % (ref 11.5–15.5)
WBC: 5.2 10*3/uL (ref 4.0–10.5)
nRBC: 0 % (ref 0.0–0.2)

## 2022-04-06 LAB — COMPREHENSIVE METABOLIC PANEL
ALT: 17 U/L (ref 0–44)
AST: 24 U/L (ref 15–41)
Albumin: 4 g/dL (ref 3.5–5.0)
Alkaline Phosphatase: 43 U/L (ref 38–126)
Anion gap: 5 (ref 5–15)
BUN: 26 mg/dL — ABNORMAL HIGH (ref 6–20)
CO2: 27 mmol/L (ref 22–32)
Calcium: 9.1 mg/dL (ref 8.9–10.3)
Chloride: 102 mmol/L (ref 98–111)
Creatinine, Ser: 1.18 mg/dL (ref 0.61–1.24)
GFR, Estimated: >60 mL/min (ref >60)
Glucose, Bld: 136 mg/dL — ABNORMAL HIGH (ref 70–99)
Potassium: 3.6 mmol/L (ref 3.5–5.1)
Sodium: 134 mmol/L — ABNORMAL LOW (ref 135–145)
Total Bilirubin: 0.8 mg/dL (ref 0.3–1.2)
Total Protein: 7.4 g/dL (ref 6.5–8.1)

## 2022-04-06 MED ORDER — SODIUM CHLORIDE 0.9 % IV SOLN
Freq: Once | INTRAVENOUS | Status: AC
  Filled 2022-04-06: qty 250

## 2022-04-06 MED ORDER — HEPARIN SOD (PORK) LOCK FLUSH 100 UNIT/ML IV SOLN
500.0000 [IU] | Freq: Once | INTRAVENOUS | Status: AC | PRN
  Administered 2022-04-06: 500 [IU]
  Filled 2022-04-06: qty 5

## 2022-04-06 MED ORDER — SODIUM CHLORIDE 0.9 % IV SOLN
500.0000 mg/m2 | Freq: Once | INTRAVENOUS | Status: AC
  Administered 2022-04-06: 1100 mg via INTRAVENOUS
  Filled 2022-04-06: qty 40

## 2022-04-06 MED ORDER — ALTEPLASE 2 MG IJ SOLR
2.0000 mg | Freq: Once | INTRAMUSCULAR | Status: AC | PRN
  Administered 2022-04-06: 2 mg
  Filled 2022-04-06: qty 2

## 2022-04-06 MED ORDER — PROCHLORPERAZINE MALEATE 10 MG PO TABS
10.0000 mg | ORAL_TABLET | Freq: Once | ORAL | Status: AC
  Administered 2022-04-06: 10 mg via ORAL
  Filled 2022-04-06: qty 1

## 2022-04-06 MED ORDER — DEXAMETHASONE SODIUM PHOSPHATE 10 MG/ML IJ SOLN
6.0000 mg | Freq: Once | INTRAMUSCULAR | Status: AC
  Administered 2022-04-06: 6 mg via INTRAVENOUS
  Filled 2022-04-06: qty 1

## 2022-04-06 NOTE — Progress Notes (Signed)
[facility] ?OFFICE PROGRESS NOTE ? ?Patient Care Team: ?[name], MD as PCP - General (Family Medicine) ?[name], MD as Consulting Physician (Internal Medicine) ? ? Cancer Staging  ?No matching staging information was found for the patient. ? ? ?Oncology History Overview Note  ?# 2012- METASTATIC ADENO CA of LEFT LUNG [acinar pattern] s/p MED LN Bx [NEG- EGFR/K-ras/ALK mutation];[name]; [name]; CT JULY  2016- NED; JAN 2018- NED;  ? ?# AUG 2020-clinical trial closed; continue maintenance Alimta ? ?# Thoracic aortic aneurysm-4.0 cm in 2016; currently 4.6 cm.  s/p referral to Dr. Faith Rogue.; MRI in 6 months planned. ? ?----------------------------------------------------------   ? ?DIAGNOSIS: Mitchell Mosley ] Adenocarcinoma lung ? ?STAGE: 4       ;GOALS: Palliative ? ?CURRENT/MOST RECENT THERAPY _0  Alimta maintenance ? ?  ?Cancer of hilus of left lung ([place] Mosley)  ?07/20/2016 Initial Diagnosis  ? Cancer of hilus of left lung ([place] Mosley) ? ?  ?12/11/2019 -  Chemotherapy  ? Patient is on Treatment Plan : LUNG Pemetrexed (Alimta) q21d   ? ?  ?  ? ?INTERVAL HISTORY: Alone.  Ambulating independently. ? ?Mitchell Mosley 61 y.o.  male pleasant patient above history of metastatic adenocarcinoma the lung-NED currently on maintenance Alimta is here for follow-up. ? ?Denies any worsening leg swelling.  Denies any worsening joint pains.  Patient stopped taking Mobic given renal dysfunction.  Currently back on Lasix/Aldactone for chronic lower extremity swelling. ? ?Review of Systems  ?Constitutional:  Negative for chills, diaphoresis, fever, malaise/fatigue and weight loss.  ?HENT:  Negative for nosebleeds and sore throat.   ?Eyes:  Negative for double vision.  ?Respiratory:  Negative for hemoptysis, shortness of breath and wheezing.   ?Cardiovascular:  Positive for leg swelling. Negative for chest pain,  palpitations and orthopnea.  ?Gastrointestinal:  Negative for abdominal pain, blood in stool, constipation, diarrhea, heartburn, melena, nausea and vomiting.  ?Genitourinary:  Negative for dysuria, frequency and urgency.  ?Musculoskeletal:  Positive for joint pain.  ?Skin: Negative.  Negative for itching and rash.  ?Neurological:  Negative for dizziness, tingling, focal weakness, weakness and headaches.  ?Endo/Heme/Allergies:  Does not bruise/bleed easily.  ?Psychiatric/Behavioral:  Negative for depression. The patient is not nervous/anxious and does not have insomnia.   ?  ? ?PAST MEDICAL HISTORY :  ?Past Medical History:  ?Diagnosis Date  ? Allergy   ? Lung cancer (Mitchell Mosley)   ? Mild valvular heart disease Nov. 2014  ? per 2 D Echocardiogram done for persistent leg edema, monitored by cardiology  ? Peripheral edema   ? Thoracic aortic aneurysm (TAA) (Mitchell Mosley)   ? ? ?PAST SURGICAL HISTORY :   ?Past Surgical History:  ?Procedure Laterality Date  ? ELBOW SURGERY Right 2011  ? IR CV LINE INJECTION  09/11/2021  ? Left anterior thoracotomy with biopsy  March 2012  ? PORTACATH PLACEMENT  March 2012  ? TONSILLECTOMY  as child  ? ? ?FAMILY HISTORY :   ?Family History  ?Problem Relation Age of Onset  ? Leukemia Father   ? Diabetes Mother   ? ? ?SOCIAL HISTORY:   ?Social History  ? ?Tobacco Use  ? Smoking status: Never  ? Smokeless tobacco: Never  ?Vaping Use  ? Vaping Use: Never used  ?Substance Use Topics  ? Alcohol use: Yes  ?  Alcohol/week: 0.0 standard drinks  ?  Comment: "social drinker"   ? Drug use: No  ? ? ?  ALLERGIES:  is allergic to penicillins. ? ?MEDICATIONS:  ?Current Outpatient Medications  ?Medication Sig Dispense Refill  ? fluticasone (FLONASE) 50 MCG/ACT nasal spray Place 2 sprays into both nostrils daily. 16 g 6  ? folic acid (FOLVITE) 1 MG tablet TAKE 1 TABLET BY MOUTH EVERY DAY 90 tablet 3  ? furosemide (LASIX) 20 MG tablet TAKE 1 TABLET (20 MG TOTAL) BY MOUTH DAILY AS NEEDED FOR SWELLING 90 tablet 1  ? Multiple  Vitamin (MULTIVITAMIN) capsule Take 1 capsule by mouth daily. Reported on 02/24/2016    ? NON FORMULARY Relief Factor with vitamin D and Turmeric    ? Omega-3 Fatty Acids (FISH OIL) 1000 MG CAPS Take 1 capsule by mouth daily. Reported on 02/24/2016    ? senna (SENOKOT) 8.6 MG tablet Take 1 tablet by mouth daily. Take as needed around time of chemotherapy    ? spironolactone (ALDACTONE) 25 MG tablet TAKE 1 TABLET BY MOUTH EVERY DAY 90 tablet 3  ? vitamin B-12 (CYANOCOBALAMIN) 1000 MCG tablet Take 1,000 mcg by mouth daily.    ? HYDROcodone-acetaminophen (NORCO) 5-325 MG tablet Take 1 tablet by mouth every 6 (six) hours as needed for moderate pain. (Patient not taking: Reported on 03/16/2022) 30 tablet 0  ? meloxicam (MOBIC) 15 MG tablet Take 15 mg by mouth daily. (Patient not taking: Reported on 03/16/2022)    ? sildenafil (VIAGRA) 100 MG tablet TAKE 1 TABLET BY MOUTH EVERY DAY AS NEEDED FOR ERECTILE DYSFUNCTION (Patient not taking: Reported on 03/16/2022) 20 tablet 11  ? tadalafil (CIALIS) 20 MG tablet Take one tablet every 2 days as needed (Patient not taking: Reported on 03/16/2022) 50 tablet 5  ? ?No current facility-administered medications for this visit.  ? ?Facility-Administered Medications Ordered in Other Visits  ?Medication Dose Route Frequency Provider Last Rate Last Admin  ? heparin lock flush 100 unit/mL  500 Units Intravenous Once Charlaine Dalton R, MD      ? sodium chloride 0.9 % injection 10 mL  10 mL Intracatheter PRN [name], MD   10 mL at 05/27/15 1543  ? sodium chloride 0.9 % injection 10 mL  10 mL Intracatheter PRN [name], MD   10 mL at 06/17/15 1510  ? sodium chloride 0.9 % injection 10 mL  10 mL Intravenous PRN [name], MD   10 mL at 07/08/15 1340  ? sodium chloride flush (NS) 0.9 % injection 10 mL  10 mL Intravenous PRN Verlon Au, NP   10 mL at 09/30/20 1412  ? ? ?PHYSICAL EXAMINATION: ?ECOG PERFORMANCE STATUS: 0 - Asymptomatic ? ?BP 129/77 (BP Location: Left Arm,  Patient Position: Sitting, Cuff Size: Normal)   Pulse 92   Temp 97.7 ?F (36.5 ?C) (Tympanic)   Ht _0  (1.778 m)   Wt 211 lb 3.2 oz (95.8 kg)   SpO2 92%   BMI 30.30 kg/m?  ? ?Filed Weights  ? 04/06/22 1310  ?Weight: 211 lb 3.2 oz (95.8 kg)  ? ? ?Physical Exam ?HENT:  ?   Head: Normocephalic and atraumatic.  ?   Mouth/Throat:  ?   Pharynx: No oropharyngeal exudate.  ?Eyes:  ?   Pupils: Pupils are equal, round, and reactive to light.  ?Cardiovascular:  ?   Rate and Rhythm: Normal rate and regular rhythm.  ?Pulmonary:  ?   Effort: No respiratory distress.  ?   Breath sounds: No wheezing.  ?Abdominal:  ?   General: Bowel sounds are normal. There is no distension.  ?  Palpations: Abdomen is soft. There is no mass.  ?   Tenderness: There is no abdominal tenderness. There is no guarding or rebound.  ?Musculoskeletal:     ?   General: No tenderness. Normal range of motion.  ?   Cervical back: Normal range of motion and neck supple.  ?   Comments: Grade 1 swelling in the legs bilaterally.  ?Skin: ?   General: Skin is warm.  ?Neurological:  ?   Mental Status: He is alert and oriented to person, place, and time.  ?Psychiatric:     ?   Mood and Affect: Affect normal.  ? ? ? ?LABORATORY DATA:  ?I have reviewed the data as listed ?   ?Component Value Date/Time  ? NA 134 (L) 04/06/2022 1308  ? NA 137 06/19/2021 1121  ? NA 138 12/10/2014 1341  ? K 3.6 04/06/2022 1308  ? K 3.6 12/10/2014 1341  ? CL 102 04/06/2022 1308  ? CL 102 12/10/2014 1341  ? CO2 27 04/06/2022 1308  ? CO2 30 12/10/2014 1341  ? GLUCOSE 136 (H) 04/06/2022 1308  ? GLUCOSE 160 (H) 12/10/2014 1341  ? BUN 26 (H) 04/06/2022 1308  ? BUN 18 06/19/2021 1121  ? BUN 21 (H) 12/10/2014 1341  ? CREATININE 1.18 04/06/2022 1308  ? CREATININE 1.10 03/25/2015 1453  ? CALCIUM 9.1 04/06/2022 1308  ? CALCIUM 8.4 (L) 12/10/2014 1341  ? PROT 7.4 04/06/2022 1308  ? PROT 7.3 06/19/2021 1121  ? PROT 6.6 12/10/2014 1341  ? ALBUMIN 4.0 04/06/2022 1308  ? ALBUMIN 4.5 06/19/2021 1121   ? ALBUMIN 3.4 12/10/2014 1341  ? AST 24 04/06/2022 1308  ? AST 17 12/10/2014 1341  ? ALT 17 04/06/2022 1308  ? ALT 23 12/10/2014 1341  ? ALKPHOS 43 04/06/2022 1308  ? ALKPHOS 50 12/10/2014 1341  ? BILITOT 0.8 05/05/

## 2022-04-06 NOTE — Assessment & Plan Note (Addendum)
#   Adenocarcinoma the lung; stage IV;  SEP 2022- CT No evidence of recurrent or metastatic carcinoma within the chest, abdomen, or pelvis. STABLE ? ?# proceed with Alimta chemotherapy. Labs today reviewed;  acceptable for treatment today. Repeat CT CAP until July 2023;  [pt awaiting for deductibles].  ? ?# Thoracic aneurysm-approximately 4.5 cm in size; on surveillance-stable. SEP 12th CT scan-STABLE.  ? ?#Chronic fluid retention-as needed Lasix Aldactone.  No worsening of renal function.  Creatinine stable. ? ?# port malfunction: fibrin sheath [2022] s/p TPA-; dysfunction noted.  Peripheral IV.  ? ?b12 -4/14- q9W ? ?# DISPOSITION:  ?# proceed with Alimta; ?# Follow up in 3 weeks MD-labs- cbc/cmp; alimta; -Dr.B ? ?

## 2022-04-27 ENCOUNTER — Inpatient Hospital Stay: Payer: BC Managed Care – PPO

## 2022-04-27 ENCOUNTER — Inpatient Hospital Stay (HOSPITAL_BASED_OUTPATIENT_CLINIC_OR_DEPARTMENT_OTHER): Payer: BC Managed Care – PPO | Admitting: Internal Medicine

## 2022-04-27 ENCOUNTER — Encounter: Payer: Self-pay | Admitting: Internal Medicine

## 2022-04-27 DIAGNOSIS — Z5111 Encounter for antineoplastic chemotherapy: Secondary | ICD-10-CM | POA: Diagnosis not present

## 2022-04-27 DIAGNOSIS — C3402 Malignant neoplasm of left main bronchus: Secondary | ICD-10-CM

## 2022-04-27 DIAGNOSIS — Z8679 Personal history of other diseases of the circulatory system: Secondary | ICD-10-CM | POA: Diagnosis not present

## 2022-04-27 DIAGNOSIS — Z79899 Other long term (current) drug therapy: Secondary | ICD-10-CM | POA: Diagnosis not present

## 2022-04-27 DIAGNOSIS — R609 Edema, unspecified: Secondary | ICD-10-CM | POA: Diagnosis not present

## 2022-04-27 LAB — CBC WITH DIFFERENTIAL/PLATELET
Abs Immature Granulocytes: 0.01 10*3/uL (ref 0.00–0.07)
Basophils Absolute: 0.1 10*3/uL (ref 0.0–0.1)
Basophils Relative: 1 %
Eosinophils Absolute: 0.2 10*3/uL (ref 0.0–0.5)
Eosinophils Relative: 4 %
HCT: 41.1 % (ref 39.0–52.0)
Hemoglobin: 14 g/dL (ref 13.0–17.0)
Immature Granulocytes: 0 %
Lymphocytes Relative: 37 %
Lymphs Abs: 1.9 10*3/uL (ref 0.7–4.0)
MCH: 31.8 pg (ref 26.0–34.0)
MCHC: 34.1 g/dL (ref 30.0–36.0)
MCV: 93.4 fL (ref 80.0–100.0)
Monocytes Absolute: 0.5 10*3/uL (ref 0.1–1.0)
Monocytes Relative: 10 %
Neutro Abs: 2.6 10*3/uL (ref 1.7–7.7)
Neutrophils Relative %: 48 %
Platelets: 157 10*3/uL (ref 150–400)
RBC: 4.4 MIL/uL (ref 4.22–5.81)
RDW: 13.7 % (ref 11.5–15.5)
WBC: 5.2 10*3/uL (ref 4.0–10.5)
nRBC: 0 % (ref 0.0–0.2)

## 2022-04-27 LAB — COMPREHENSIVE METABOLIC PANEL
ALT: 18 U/L (ref 0–44)
AST: 26 U/L (ref 15–41)
Albumin: 3.8 g/dL (ref 3.5–5.0)
Alkaline Phosphatase: 45 U/L (ref 38–126)
Anion gap: 6 (ref 5–15)
BUN: 21 mg/dL — ABNORMAL HIGH (ref 6–20)
CO2: 27 mmol/L (ref 22–32)
Calcium: 8.9 mg/dL (ref 8.9–10.3)
Chloride: 102 mmol/L (ref 98–111)
Creatinine, Ser: 1.15 mg/dL (ref 0.61–1.24)
GFR, Estimated: >60 mL/min (ref >60)
Glucose, Bld: 155 mg/dL — ABNORMAL HIGH (ref 70–99)
Potassium: 3.8 mmol/L (ref 3.5–5.1)
Sodium: 135 mmol/L (ref 135–145)
Total Bilirubin: 0.6 mg/dL (ref 0.3–1.2)
Total Protein: 7.3 g/dL (ref 6.5–8.1)

## 2022-04-27 MED ORDER — HEPARIN SOD (PORK) LOCK FLUSH 100 UNIT/ML IV SOLN
INTRAVENOUS | Status: AC
  Administered 2022-04-27: 500 [IU]
  Filled 2022-04-27: qty 5

## 2022-04-27 MED ORDER — SODIUM CHLORIDE 0.9 % IV SOLN
500.0000 mg/m2 | Freq: Once | INTRAVENOUS | Status: AC
  Administered 2022-04-27: 1100 mg via INTRAVENOUS
  Filled 2022-04-27: qty 40

## 2022-04-27 MED ORDER — HEPARIN SOD (PORK) LOCK FLUSH 100 UNIT/ML IV SOLN
500.0000 [IU] | Freq: Once | INTRAVENOUS | Status: AC | PRN
  Filled 2022-04-27: qty 5

## 2022-04-27 MED ORDER — SODIUM CHLORIDE 0.9 % IV SOLN
Freq: Once | INTRAVENOUS | Status: AC
  Filled 2022-04-27: qty 250

## 2022-04-27 MED ORDER — PROCHLORPERAZINE MALEATE 10 MG PO TABS
10.0000 mg | ORAL_TABLET | Freq: Once | ORAL | Status: AC
  Administered 2022-04-27: 10 mg via ORAL
  Filled 2022-04-27: qty 1

## 2022-04-27 MED ORDER — DEXAMETHASONE SODIUM PHOSPHATE 10 MG/ML IJ SOLN
6.0000 mg | Freq: Once | INTRAMUSCULAR | Status: AC
  Administered 2022-04-27: 6 mg via INTRAVENOUS
  Filled 2022-04-27: qty 1

## 2022-04-27 NOTE — Assessment & Plan Note (Addendum)
#   Adenocarcinoma the lung; stage IV;  SEP 2022- CT No evidence of recurrent or metastatic carcinoma within the chest, abdomen, or pelvis. STABLE  # proceed with Alimta chemotherapy. Labs today reviewed;  acceptable for treatment today. Repeat CT CAP until July 2023;  [pt awaiting for deductibles].   # Thoracic aneurysm-approximately 4.5 cm in size; on surveillance-stable. SEP 12th CT scan- STABLE.   #Chronic fluid retention-as needed Lasix Aldactone.  No worsening of renal function.- Stable.  # port malfunction: fibrin sheath [2022] s/p TPA-; dysfunction noted- OK today.   b12 -4/14- q9W  # DISPOSITION:  # proceed with Alimta; # Follow up in 3 weeks MD-labs- cbc/cmp; alimta; B12 ; -Dr.B

## 2022-04-27 NOTE — Patient Instructions (Signed)
[facility] CANCER CTR AT [place]  Discharge Instructions: Thank you for choosing [facility] to provide your oncology and hematology care.  If you have a lab appointment with the [facility], please go directly to the [facility] and check in at the registration area.  Wear comfortable clothing and clothing appropriate for easy access to any Portacath or PICC line.   We strive to give you quality time with your provider. You may need to reschedule your appointment if you arrive late (15 or more minutes).  Arriving late affects you and other patients whose appointments are after yours.  Also, if you miss three or more appointments without notifying the office, you may be dismissed from the clinic at the provider's discretion.      For prescription refill requests, have your pharmacy contact our office and allow 72 hours for refills to be completed.    Today you received the following chemotherapy and/or immunotherapy agents Alimta      To help prevent nausea and vomiting after your treatment, we encourage you to take your nausea medication as directed.  BELOW ARE SYMPTOMS THAT SHOULD BE REPORTED IMMEDIATELY: *FEVER GREATER THAN 100.4 F (38 C) OR HIGHER *CHILLS OR SWEATING *NAUSEA AND VOMITING THAT IS NOT CONTROLLED WITH YOUR NAUSEA MEDICATION *UNUSUAL SHORTNESS OF BREATH *UNUSUAL BRUISING OR BLEEDING *URINARY PROBLEMS (pain or burning when urinating, or frequent urination) *BOWEL PROBLEMS (unusual diarrhea, constipation, pain near the anus) TENDERNESS IN MOUTH AND THROAT WITH OR WITHOUT PRESENCE OF ULCERS (sore throat, sores in mouth, or a toothache) UNUSUAL RASH, SWELLING OR PAIN  UNUSUAL VAGINAL DISCHARGE OR ITCHING   Items with * indicate a potential emergency and should be followed up as soon as possible or go to the Emergency Department if any problems should occur.  Please show the CHEMOTHERAPY ALERT CARD or IMMUNOTHERAPY ALERT CARD at check-in to the  Emergency Department and triage nurse.  Should you have questions after your visit or need to cancel or reschedule your appointment, please contact [facility] CANCER [place] AT [place]  [phone] and follow the prompts.  Office hours are 8:00 a.m. to 4:30 p.m. Monday - Friday. Please note that voicemails left after 4:00 p.m. may not be returned until the following business day.  We are closed weekends and major holidays. You have access to a nurse at all times for urgent questions. Please call the main number to the clinic [phone] and follow the prompts.  For any non-urgent questions, you may also contact your provider using MyChart. We now offer e-Visits for anyone 43 and older to request care online for non-urgent symptoms. For details visit [email or web address].   Also download the MyChart app! Go to the app store, search "MyChart", open the app, select [facility], and log in with your MyChart username and password.  Due to Covid, a mask is required upon entering the hospital/clinic. If you do not have a mask, one will be given to you upon arrival. For doctor visits, patients may have 1 support person aged 70 or older with them. For treatment visits, patients cannot have anyone with them due to current Covid guidelines and our immunocompromised population.

## 2022-04-27 NOTE — Progress Notes (Unsigned)
[place] OFFICE PROGRESS NOTE  Patient Care Team: [name], MD as PCP - General (Family Medicine) [name], MD as Consulting Physician (Internal Medicine)   Cancer Staging  No matching staging information was found for the patient.   Oncology History Overview Note  # 2012- METASTATIC ADENO CA of LEFT LUNG [acinar pattern] s/p MED LN Bx [NEG- EGFR/K-ras/ALK mutation];[name]; [name]; CT JULY  2016- NED; JAN 2018- NED;   # AUG 2020-clinical trial closed; continue maintenance Alimta  # Thoracic aortic aneurysm-4.0 cm in 2016; currently 4.6 cm.  s/p referral to Dr. Faith Rogue.; MRI in 6 months planned.  ----------------------------------------------------------    DIAGNOSIS: [name]  STAGE: 4       ;GOALS: Palliative  CURRENT/MOST RECENT THERAPY [ ] Alimta maintenance    Cancer of hilus of left lung ([place])  07/20/2016 Initial Diagnosis   Cancer of hilus of left lung ([place])    12/11/2019 -  Chemotherapy   Patient is on Treatment Plan : LUNG Pemetrexed (Alimta) q21d        INTERVAL HISTORY: Alone.  Ambulating independently.  [name] 61 y.o.  male pleasant patient above history of metastatic adenocarcinoma the lung-NED currently on maintenance Alimta is here for follow-up.  Denies any worsening shortness of breath or cough.  Denies any worsening joint pains.  Patient not on Mobic because of renal dysfunction. Currently back on Lasix/Aldactone for chronic lower extremity swelling.  Review of Systems  Constitutional:  Negative for chills, diaphoresis, fever, malaise/fatigue and weight loss.  HENT:  Negative for nosebleeds and sore throat.   Eyes:  Negative for double vision.  Respiratory:  Negative for hemoptysis, shortness of breath and wheezing.   Cardiovascular:  Positive for leg swelling. Negative for chest  pain, palpitations and orthopnea.  Gastrointestinal:  Negative for abdominal pain, blood in stool, constipation, diarrhea, heartburn, melena, nausea and vomiting.  Genitourinary:  Negative for dysuria, frequency and urgency.  Musculoskeletal:  Positive for joint pain.  Skin: Negative.  Negative for itching and rash.  Neurological:  Negative for dizziness, tingling, focal weakness, weakness and headaches.  Endo/Heme/Allergies:  Does not bruise/bleed easily.  Psychiatric/Behavioral:  Negative for depression. The patient is not nervous/anxious and does not have insomnia.      PAST MEDICAL HISTORY :  Past Medical History:  Diagnosis Date   Allergy    Lung cancer ([place])    Mild valvular heart disease Nov. 2014   per 2 D Echocardiogram done for persistent leg edema, monitored by cardiology   Peripheral edema    Thoracic aortic aneurysm (TAA) ([place])     PAST SURGICAL HISTORY :   Past Surgical History:  Procedure Laterality Date   ELBOW SURGERY Right 2011   IR CV LINE INJECTION  09/11/2021   Left anterior thoracotomy with biopsy  March 2012   PORTACATH PLACEMENT  March 2012   TONSILLECTOMY  as child    FAMILY HISTORY :   Family History  Problem Relation Age of Onset   Leukemia Father    Diabetes Mother     SOCIAL HISTORY:   Social History   Tobacco Use   Smoking status: Never   Smokeless tobacco: Never  Vaping Use   Vaping Use: Never used  Substance Use Topics   Alcohol use: Yes    Alcohol/week: 0.0 standard drinks    Comment: "social drinker"    Drug  use: No    ALLERGIES:  is allergic to penicillins.  MEDICATIONS:  Current Outpatient Medications  Medication Sig Dispense Refill   fluticasone (FLONASE) 50 MCG/ACT nasal spray Place 2 sprays into both nostrils daily. 16 g 6   folic acid (FOLVITE) 1 MG tablet TAKE 1 TABLET BY MOUTH EVERY DAY 90 tablet 3   furosemide (LASIX) 20 MG tablet TAKE 1 TABLET (20 MG TOTAL) BY MOUTH DAILY AS NEEDED FOR SWELLING 90 tablet 1    Multiple Vitamin (MULTIVITAMIN) capsule Take 1 capsule by mouth daily. Reported on 02/24/2016     NON FORMULARY Relief Factor with vitamin D and Turmeric     Omega-3 Fatty Acids (FISH OIL) 1000 MG CAPS Take 1 capsule by mouth daily. Reported on 02/24/2016     senna (SENOKOT) 8.6 MG tablet Take 1 tablet by mouth daily. Take as needed around time of chemotherapy     spironolactone (ALDACTONE) 25 MG tablet TAKE 1 TABLET BY MOUTH EVERY DAY 90 tablet 3   vitamin B-12 (CYANOCOBALAMIN) 1000 MCG tablet Take 1,000 mcg by mouth daily.     HYDROcodone-acetaminophen (NORCO) 5-325 MG tablet Take 1 tablet by mouth every 6 (six) hours as needed for moderate pain. (Patient not taking: Reported on 03/16/2022) 30 tablet 0   meloxicam (MOBIC) 15 MG tablet Take 15 mg by mouth daily. (Patient not taking: Reported on 03/16/2022)     sildenafil (VIAGRA) 100 MG tablet TAKE 1 TABLET BY MOUTH EVERY DAY AS NEEDED FOR ERECTILE DYSFUNCTION (Patient not taking: Reported on 03/16/2022) 20 tablet 11   tadalafil (CIALIS) 20 MG tablet Take one tablet every 2 days as needed (Patient not taking: Reported on 03/16/2022) 50 tablet 5   No current facility-administered medications for this visit.   Facility-Administered Medications Ordered in Other Visits  Medication Dose Route Frequency Provider Last Rate Last Admin   heparin lock flush 100 unit/mL  500 Units Intravenous Once Charlaine Dalton R, MD       sodium chloride 0.9 % injection 10 mL  10 mL Intracatheter PRN [name], MD   10 mL at 05/27/15 1543   sodium chloride 0.9 % injection 10 mL  10 mL Intracatheter PRN [name], MD   10 mL at 06/17/15 1510   sodium chloride 0.9 % injection 10 mL  10 mL Intravenous PRN [name], MD   10 mL at 07/08/15 1340   sodium chloride flush (NS) 0.9 % injection 10 mL  10 mL Intravenous PRN Verlon Au, NP   10 mL at 09/30/20 1412    PHYSICAL EXAMINATION: ECOG PERFORMANCE STATUS: 0 - Asymptomatic  BP 125/79 (BP Location:  Left Arm, Patient Position: Sitting)   Pulse 85   Temp 97.8 F (36.6 C) (Tympanic)   Resp 16   Wt 211 lb (95.7 kg)   SpO2 98%   BMI 30.28 kg/m   Filed Weights   04/27/22 1405  Weight: 211 lb (95.7 kg)    Physical Exam HENT:     Head: Normocephalic and atraumatic.     Mouth/Throat:     Pharynx: No oropharyngeal exudate.  Eyes:     Pupils: Pupils are equal, round, and reactive to light.  Cardiovascular:     Rate and Rhythm: Normal rate and regular rhythm.  Pulmonary:     Effort: No respiratory distress.     Breath sounds: No wheezing.  Abdominal:     General: Bowel sounds are normal. There is no distension.     Palpations: Abdomen  is soft. There is no mass.     Tenderness: There is no abdominal tenderness. There is no guarding or rebound.  Musculoskeletal:        General: No tenderness. Normal range of motion.     Cervical back: Normal range of motion and neck supple.     Comments: Grade 1 swelling in the legs bilaterally.  Skin:    General: Skin is warm.  Neurological:     Mental Status: He is alert and oriented to person, place, and time.  Psychiatric:        Mood and Affect: Affect normal.     LABORATORY DATA:  I have reviewed the data as listed    Component Value Date/Time   NA 135 04/27/2022 1324   NA 137 06/19/2021 1121   NA 138 12/10/2014 1341   K 3.8 04/27/2022 1324   K 3.6 12/10/2014 1341   CL 102 04/27/2022 1324   CL 102 12/10/2014 1341   CO2 27 04/27/2022 1324   CO2 30 12/10/2014 1341   GLUCOSE 155 (H) 04/27/2022 1324   GLUCOSE 160 (H) 12/10/2014 1341   BUN 21 (H) 04/27/2022 1324   BUN 18 06/19/2021 1121   BUN 21 (H) 12/10/2014 1341   CREATININE 1.15 04/27/2022 1324   CREATININE 1.10 03/25/2015 1453   CALCIUM 8.9 04/27/2022 1324   CALCIUM 8.4 (L) 12/10/2014 1341   PROT 7.3 04/27/2022 1324   PROT 7.3 06/19/2021 1121   PROT 6.6 12/10/2014 1341   ALBUMIN 3.8 04/27/2022 1324   ALBUMIN 4.5 06/19/2021 1121   ALBUMIN 3.4 12/10/2014 1341   AST  26 04/27/2022 1324   AST 17 12/10/2014 1341   ALT 18 04/27/2022 1324   ALT 23 12/10/2014 1341   ALKPHOS 45 04/27/2022 1324   ALKPHOS 50 12/10/2014 1341   BILITOT 0.6 04/27/2022 1324   BILITOT 0.2 06/19/2021 1121   BILITOT 0.5 12/10/2014 1341   GFRNONAA >60 04/27/2022 1324   GFRNONAA >60 03/25/2015 1453   GFRAA >60 08/19/2020 1324   GFRAA >60 03/25/2015 1453    No results found for: SPEP, UPEP  Lab Results  Component Value Date   WBC 5.2 04/27/2022   NEUTROABS 2.6 04/27/2022   HGB 14.0 04/27/2022   HCT 41.1 04/27/2022   MCV 93.4 04/27/2022   PLT 157 04/27/2022      Chemistry      Component Value Date/Time   NA 135 04/27/2022 1324   NA 137 06/19/2021 1121   NA 138 12/10/2014 1341   K 3.8 04/27/2022 1324   K 3.6 12/10/2014 1341   CL 102 04/27/2022 1324   CL 102 12/10/2014 1341   CO2 27 04/27/2022 1324   CO2 30 12/10/2014 1341   BUN 21 (H) 04/27/2022 1324   BUN 18 06/19/2021 1121   BUN 21 (H) 12/10/2014 1341   CREATININE 1.15 04/27/2022 1324   CREATININE 1.10 03/25/2015 1453      Component Value Date/Time   CALCIUM 8.9 04/27/2022 1324   CALCIUM 8.4 (L) 12/10/2014 1341   ALKPHOS 45 04/27/2022 1324   ALKPHOS 50 12/10/2014 1341   AST 26 04/27/2022 1324   AST 17 12/10/2014 1341   ALT 18 04/27/2022 1324   ALT 23 12/10/2014 1341   BILITOT 0.6 04/27/2022 1324   BILITOT 0.2 06/19/2021 1121   BILITOT 0.5 12/10/2014 1341       RADIOGRAPHIC STUDIES: I have personally reviewed the radiological images as listed and agreed with the findings in the report. No results  found.   ASSESSMENT & PLAN:  Cancer of hilus of left lung ([place]) # Adenocarcinoma the lung; stage IV;  SEP 2022- CT No evidence of recurrent or metastatic carcinoma within the chest, abdomen, or pelvis. STABLE  # proceed with Alimta chemotherapy. Labs today reviewed;  acceptable for treatment today. Repeat CT CAP until July 2023;  [pt awaiting for deductibles].   # Thoracic aneurysm-approximately 4.5 cm  in size; on surveillance-stable. SEP 12th CT scan- STABLE.   #Chronic fluid retention-as needed Lasix Aldactone.  No worsening of renal function.- Stable.  # port malfunction: fibrin sheath [2022] s/p TPA-; dysfunction noted- OK today.   b12 -4/14- q9W  # DISPOSITION:  # proceed with Alimta; # Follow up in 3 weeks MD-labs- cbc/cmp; alimta; B12 ; -Dr.B    No orders of the defined types were placed in this encounter.    All questions were answered. The patient knows to call the clinic with any problems, questions or concerns.      [name], MD 04/28/2022 1:03 PM

## 2022-04-28 ENCOUNTER — Encounter: Payer: Self-pay | Admitting: Internal Medicine

## 2022-05-18 ENCOUNTER — Encounter: Payer: Self-pay | Admitting: Internal Medicine

## 2022-05-18 ENCOUNTER — Inpatient Hospital Stay: Payer: BC Managed Care – PPO

## 2022-05-18 ENCOUNTER — Inpatient Hospital Stay (HOSPITAL_BASED_OUTPATIENT_CLINIC_OR_DEPARTMENT_OTHER): Payer: BC Managed Care – PPO | Admitting: Internal Medicine

## 2022-05-18 ENCOUNTER — Inpatient Hospital Stay: Payer: BC Managed Care – PPO | Attending: Internal Medicine

## 2022-05-18 DIAGNOSIS — Z5111 Encounter for antineoplastic chemotherapy: Secondary | ICD-10-CM | POA: Diagnosis not present

## 2022-05-18 DIAGNOSIS — Z79899 Other long term (current) drug therapy: Secondary | ICD-10-CM | POA: Insufficient documentation

## 2022-05-18 DIAGNOSIS — Z8679 Personal history of other diseases of the circulatory system: Secondary | ICD-10-CM | POA: Diagnosis not present

## 2022-05-18 DIAGNOSIS — Z806 Family history of leukemia: Secondary | ICD-10-CM | POA: Diagnosis not present

## 2022-05-18 DIAGNOSIS — R609 Edema, unspecified: Secondary | ICD-10-CM | POA: Diagnosis not present

## 2022-05-18 DIAGNOSIS — C3402 Malignant neoplasm of left main bronchus: Secondary | ICD-10-CM

## 2022-05-18 DIAGNOSIS — I712 Thoracic aortic aneurysm, without rupture, unspecified: Secondary | ICD-10-CM | POA: Insufficient documentation

## 2022-05-18 DIAGNOSIS — E538 Deficiency of other specified B group vitamins: Secondary | ICD-10-CM | POA: Diagnosis not present

## 2022-05-18 DIAGNOSIS — D696 Thrombocytopenia, unspecified: Secondary | ICD-10-CM

## 2022-05-18 LAB — COMPREHENSIVE METABOLIC PANEL
ALT: 17 U/L (ref 0–44)
AST: 25 U/L (ref 15–41)
Albumin: 3.9 g/dL (ref 3.5–5.0)
Alkaline Phosphatase: 41 U/L (ref 38–126)
Anion gap: 9 (ref 5–15)
BUN: 15 mg/dL (ref 6–20)
CO2: 27 mmol/L (ref 22–32)
Calcium: 9.1 mg/dL (ref 8.9–10.3)
Chloride: 100 mmol/L (ref 98–111)
Creatinine, Ser: 1.09 mg/dL (ref 0.61–1.24)
GFR, Estimated: >60 mL/min (ref >60)
Glucose, Bld: 140 mg/dL — ABNORMAL HIGH (ref 70–99)
Potassium: 3.9 mmol/L (ref 3.5–5.1)
Sodium: 136 mmol/L (ref 135–145)
Total Bilirubin: 0.6 mg/dL (ref 0.3–1.2)
Total Protein: 7.4 g/dL (ref 6.5–8.1)

## 2022-05-18 LAB — CBC WITH DIFFERENTIAL/PLATELET
Abs Immature Granulocytes: 0.06 10*3/uL (ref 0.00–0.07)
Basophils Absolute: 0 10*3/uL (ref 0.0–0.1)
Basophils Relative: 1 %
Eosinophils Absolute: 0.1 10*3/uL (ref 0.0–0.5)
Eosinophils Relative: 2 %
HCT: 41.1 % (ref 39.0–52.0)
Hemoglobin: 14.1 g/dL (ref 13.0–17.0)
Immature Granulocytes: 1 %
Lymphocytes Relative: 34 %
Lymphs Abs: 1.7 10*3/uL (ref 0.7–4.0)
MCH: 32.3 pg (ref 26.0–34.0)
MCHC: 34.3 g/dL (ref 30.0–36.0)
MCV: 94.1 fL (ref 80.0–100.0)
Monocytes Absolute: 0.5 10*3/uL (ref 0.1–1.0)
Monocytes Relative: 10 %
Neutro Abs: 2.5 10*3/uL (ref 1.7–7.7)
Neutrophils Relative %: 52 %
Platelets: 158 10*3/uL (ref 150–400)
RBC: 4.37 MIL/uL (ref 4.22–5.81)
RDW: 12.9 % (ref 11.5–15.5)
WBC: 4.9 10*3/uL (ref 4.0–10.5)
nRBC: 0 % (ref 0.0–0.2)

## 2022-05-18 MED ORDER — DEXAMETHASONE SODIUM PHOSPHATE 10 MG/ML IJ SOLN
6.0000 mg | Freq: Once | INTRAMUSCULAR | Status: AC
  Administered 2022-05-18: 6 mg via INTRAVENOUS
  Filled 2022-05-18: qty 1

## 2022-05-18 MED ORDER — HEPARIN SOD (PORK) LOCK FLUSH 100 UNIT/ML IV SOLN
INTRAVENOUS | Status: AC
  Filled 2022-05-18: qty 5

## 2022-05-18 MED ORDER — SODIUM CHLORIDE 0.9 % IV SOLN
500.0000 mg/m2 | Freq: Once | INTRAVENOUS | Status: AC
  Administered 2022-05-18: 1100 mg via INTRAVENOUS
  Filled 2022-05-18: qty 40

## 2022-05-18 MED ORDER — SODIUM CHLORIDE 0.9 % IV SOLN
Freq: Once | INTRAVENOUS | Status: AC
  Filled 2022-05-18: qty 250

## 2022-05-18 MED ORDER — HEPARIN SOD (PORK) LOCK FLUSH 100 UNIT/ML IV SOLN
500.0000 [IU] | Freq: Once | INTRAVENOUS | Status: DC | PRN
  Filled 2022-05-18: qty 5

## 2022-05-18 MED ORDER — CYANOCOBALAMIN 1000 MCG/ML IJ SOLN
1000.0000 ug | Freq: Once | INTRAMUSCULAR | Status: AC
  Administered 2022-05-18: 1000 ug via INTRAMUSCULAR
  Filled 2022-05-18: qty 1

## 2022-05-18 MED ORDER — PROCHLORPERAZINE MALEATE 10 MG PO TABS
10.0000 mg | ORAL_TABLET | Freq: Once | ORAL | Status: AC
  Administered 2022-05-18: 10 mg via ORAL
  Filled 2022-05-18: qty 1

## 2022-05-18 NOTE — Progress Notes (Signed)
[place] OFFICE PROGRESS NOTE  Patient Care Team: [name], MD as PCP - General (Family Medicine) [name], MD as Consulting Physician (Internal Medicine)   Cancer Staging  No matching staging information was found for the patient.   Oncology History Overview Note  # 2012- METASTATIC ADENO CA of LEFT LUNG [acinar pattern] s/p MED LN Bx [NEG- EGFR/K-ras/ALK mutation];[name]; [name]; CT JULY  2016- NED; JAN 2018- NED;   # AUG 2020-clinical trial closed; continue maintenance Alimta  # Thoracic aortic aneurysm-4.0 cm in 2016; currently 4.6 cm.  s/p referral to Dr. Faith Rogue.; MRI in 6 months planned.  ----------------------------------------------------------    DIAGNOSIS: Mitchell Mosley ] Adenocarcinoma lung  STAGE: 4       ;GOALS: Palliative  CURRENT/MOST RECENT THERAPY [ ] Alimta maintenance    Cancer of hilus of left lung ([place])  07/20/2016 Initial Diagnosis   Cancer of hilus of left lung ([place])   12/11/2019 -  Chemotherapy   Patient is on Treatment Plan : LUNG Pemetrexed (Alimta) q21d       INTERVAL HISTORY: Alone.  Ambulating independently.  Mitchell Mosley 61 y.o.  male pleasant patient above history of metastatic adenocarcinoma the lung-NED currently on maintenance Alimta is here for follow-up.  No new shortness of breath or cough he denies any worsening joint pains. Currently  on Lasix/Aldactone for chronic lower extremity swelling.  Review of Systems  Constitutional:  Negative for chills, diaphoresis, fever, malaise/fatigue and weight loss.  HENT:  Negative for nosebleeds and sore throat.   Eyes:  Negative for double vision.  Respiratory:  Negative for hemoptysis, shortness of breath and wheezing.   Cardiovascular:  Positive for leg swelling. Negative for chest pain, palpitations and orthopnea.  Gastrointestinal:  Negative for abdominal  pain, blood in stool, constipation, diarrhea, heartburn, melena, nausea and vomiting.  Genitourinary:  Negative for dysuria, frequency and urgency.  Musculoskeletal:  Positive for joint pain.  Skin: Negative.  Negative for itching and rash.  Neurological:  Negative for dizziness, tingling, focal weakness, weakness and headaches.  Endo/Heme/Allergies:  Does not bruise/bleed easily.  Psychiatric/Behavioral:  Negative for depression. The patient is not nervous/anxious and does not have insomnia.       PAST MEDICAL HISTORY :  Past Medical History:  Diagnosis Date   Allergy    Lung cancer ([place])    Mild valvular heart disease Nov. 2014   per 2 D Echocardiogram done for persistent leg edema, monitored by cardiology   Peripheral edema    Thoracic aortic aneurysm (TAA) ([place])     PAST SURGICAL HISTORY :   Past Surgical History:  Procedure Laterality Date   ELBOW SURGERY Right 2011   IR CV LINE INJECTION  09/11/2021   Left anterior thoracotomy with biopsy  March 2012   PORTACATH PLACEMENT  March 2012   TONSILLECTOMY  as child    FAMILY HISTORY :   Family History  Problem Relation Age of Onset   Leukemia Father    Diabetes Mother     SOCIAL HISTORY:   Social History   Tobacco Use   Smoking status: Never   Smokeless tobacco: Never  Vaping Use   Vaping Use: Never used  Substance Use Topics   Alcohol use: Yes    Alcohol/week: 0.0 standard drinks of alcohol    Comment: "social drinker"    Drug use: No    ALLERGIES:  is allergic  to penicillins.  MEDICATIONS:  Current Outpatient Medications  Medication Sig Dispense Refill   fluticasone (FLONASE) 50 MCG/ACT nasal spray Place 2 sprays into both nostrils daily. 16 g 6   folic acid (FOLVITE) 1 MG tablet TAKE 1 TABLET BY MOUTH EVERY DAY 90 tablet 3   furosemide (LASIX) 20 MG tablet TAKE 1 TABLET (20 MG TOTAL) BY MOUTH DAILY AS NEEDED FOR SWELLING 90 tablet 1   Multiple Vitamin (MULTIVITAMIN) capsule Take 1 capsule by mouth  daily. Reported on 02/24/2016     NON FORMULARY Relief Factor with vitamin D and Turmeric     Omega-3 Fatty Acids (FISH OIL) 1000 MG CAPS Take 1 capsule by mouth daily. Reported on 02/24/2016     senna (SENOKOT) 8.6 MG tablet Take 1 tablet by mouth daily. Take as needed around time of chemotherapy     spironolactone (ALDACTONE) 25 MG tablet TAKE 1 TABLET BY MOUTH EVERY DAY 90 tablet 3   vitamin B-12 (CYANOCOBALAMIN) 1000 MCG tablet Take 1,000 mcg by mouth daily.     HYDROcodone-acetaminophen (NORCO) 5-325 MG tablet Take 1 tablet by mouth every 6 (six) hours as needed for moderate pain. (Patient not taking: Reported on 03/16/2022) 30 tablet 0   meloxicam (MOBIC) 15 MG tablet Take 15 mg by mouth daily. (Patient not taking: Reported on 03/16/2022)     sildenafil (VIAGRA) 100 MG tablet TAKE 1 TABLET BY MOUTH EVERY DAY AS NEEDED FOR ERECTILE DYSFUNCTION (Patient not taking: Reported on 03/16/2022) 20 tablet 11   tadalafil (CIALIS) 20 MG tablet Take one tablet every 2 days as needed (Patient not taking: Reported on 03/16/2022) 50 tablet 5   No current facility-administered medications for this visit.   Facility-Administered Medications Ordered in Other Visits  Medication Dose Route Frequency Provider Last Rate Last Admin   heparin lock flush 100 UNIT/ML injection            heparin lock flush 100 unit/mL  500 Units Intravenous Once Charlaine Dalton R, MD       heparin lock flush 100 unit/mL  500 Units Intracatheter Once PRN [name], MD       sodium chloride 0.9 % injection 10 mL  10 mL Intracatheter PRN [name], MD   10 mL at 05/27/15 1543   sodium chloride 0.9 % injection 10 mL  10 mL Intracatheter PRN [name], MD   10 mL at 06/17/15 1510   sodium chloride 0.9 % injection 10 mL  10 mL Intravenous PRN [name], MD   10 mL at 07/08/15 1340   sodium chloride flush (NS) 0.9 % injection 10 mL  10 mL Intravenous PRN Verlon Au, NP   10 mL at 09/30/20 1412    PHYSICAL  EXAMINATION: ECOG PERFORMANCE STATUS: 0 - Asymptomatic  BP (!) 142/74 (BP Location: Left Arm, Patient Position: Sitting, Cuff Size: Normal)   Pulse 75   Temp 98.5 F (36.9 C) (Tympanic)   Ht 5' 10" (1.778 m)   Wt 208 lb 12.8 oz (94.7 kg)   SpO2 99%   BMI 29.96 kg/m   Filed Weights   05/18/22 1342  Weight: 208 lb 12.8 oz (94.7 kg)    Physical Exam HENT:     Head: Normocephalic and atraumatic.     Mouth/Throat:     Pharynx: No oropharyngeal exudate.  Eyes:     Pupils: Pupils are equal, round, and reactive to light.  Cardiovascular:     Rate and Rhythm: Normal rate and regular  rhythm.  Pulmonary:     Effort: No respiratory distress.     Breath sounds: No wheezing.  Abdominal:     General: Bowel sounds are normal. There is no distension.     Palpations: Abdomen is soft. There is no mass.     Tenderness: There is no abdominal tenderness. There is no guarding or rebound.  Musculoskeletal:        General: No tenderness. Normal range of motion.     Cervical back: Normal range of motion and neck supple.     Comments: Grade 1 swelling in the legs bilaterally.  Skin:    General: Skin is warm.  Neurological:     Mental Status: He is alert and oriented to person, place, and time.  Psychiatric:        Mood and Affect: Affect normal.      LABORATORY DATA:  I have reviewed the data as listed    Component Value Date/Time   NA 136 05/18/2022 1340   NA 137 06/19/2021 1121   NA 138 12/10/2014 1341   K 3.9 05/18/2022 1340   K 3.6 12/10/2014 1341   CL 100 05/18/2022 1340   CL 102 12/10/2014 1341   CO2 27 05/18/2022 1340   CO2 30 12/10/2014 1341   GLUCOSE 140 (H) 05/18/2022 1340   GLUCOSE 160 (H) 12/10/2014 1341   BUN 15 05/18/2022 1340   BUN 18 06/19/2021 1121   BUN 21 (H) 12/10/2014 1341   CREATININE 1.09 05/18/2022 1340   CREATININE 1.10 03/25/2015 1453   CALCIUM 9.1 05/18/2022 1340   CALCIUM 8.4 (L) 12/10/2014 1341   PROT 7.4 05/18/2022 1340   PROT 7.3 06/19/2021  1121   PROT 6.6 12/10/2014 1341   ALBUMIN 3.9 05/18/2022 1340   ALBUMIN 4.5 06/19/2021 1121   ALBUMIN 3.4 12/10/2014 1341   AST 25 05/18/2022 1340   AST 17 12/10/2014 1341   ALT 17 05/18/2022 1340   ALT 23 12/10/2014 1341   ALKPHOS 41 05/18/2022 1340   ALKPHOS 50 12/10/2014 1341   BILITOT 0.6 05/18/2022 1340   BILITOT 0.2 06/19/2021 1121   BILITOT 0.5 12/10/2014 1341   GFRNONAA >60 05/18/2022 1340   GFRNONAA >60 03/25/2015 1453   GFRAA >60 08/19/2020 1324   GFRAA >60 03/25/2015 1453    No results found for: "SPEP", "UPEP"  Lab Results  Component Value Date   WBC 4.9 05/18/2022   NEUTROABS 2.5 05/18/2022   HGB 14.1 05/18/2022   HCT 41.1 05/18/2022   MCV 94.1 05/18/2022   PLT 158 05/18/2022      Chemistry      Component Value Date/Time   NA 136 05/18/2022 1340   NA 137 06/19/2021 1121   NA 138 12/10/2014 1341   K 3.9 05/18/2022 1340   K 3.6 12/10/2014 1341   CL 100 05/18/2022 1340   CL 102 12/10/2014 1341   CO2 27 05/18/2022 1340   CO2 30 12/10/2014 1341   BUN 15 05/18/2022 1340   BUN 18 06/19/2021 1121   BUN 21 (H) 12/10/2014 1341   CREATININE 1.09 05/18/2022 1340   CREATININE 1.10 03/25/2015 1453      Component Value Date/Time   CALCIUM 9.1 05/18/2022 1340   CALCIUM 8.4 (L) 12/10/2014 1341   ALKPHOS 41 05/18/2022 1340   ALKPHOS 50 12/10/2014 1341   AST 25 05/18/2022 1340   AST 17 12/10/2014 1341   ALT 17 05/18/2022 1340   ALT 23 12/10/2014 1341   BILITOT 0.6 05/18/2022 1340  BILITOT 0.2 06/19/2021 1121   BILITOT 0.5 12/10/2014 1341       RADIOGRAPHIC STUDIES: I have personally reviewed the radiological images as listed and agreed with the findings in the report. No results found.   ASSESSMENT & PLAN:  Cancer of hilus of left lung ([place]) # Adenocarcinoma the lung; stage IV;  SEP 2022- CT No evidence of recurrent or metastatic carcinoma within the chest, abdomen, or pelvis. STABLE  # proceed with Alimta chemotherapy. Labs today reviewed;   acceptable for treatment today. Repeat CT CAP until July 2023;  [pt awaiting for deductibles]; will order imaging at next visit.  # Thoracic aneurysm-approximately 4.5 cm in size; on surveillance-stable. SEP 12th CT scan- STABLE.  Awaiting cardiology evaluation in the next few weeks.  #Chronic fluid retention-as needed Lasix Aldactone.  No worsening of renal function.- Stable.  # port malfunction: fibrin sheath [2022] s/p TPA-; dysfunction noted- OK today.   b12 -6/16- q9W  # DISPOSITION:  # proceed with Alimta; B12 injection # Follow up in 3 weeks MD-labs- cbc/cmp; alimta- -Dr.B    No orders of the defined types were placed in this encounter.    All questions were answered. The patient knows to call the clinic with any problems, questions or concerns.      [name], MD 05/18/2022 3:16 PM

## 2022-05-18 NOTE — Patient Instructions (Signed)
[facility]  Discharge Instructions: Thank you for choosing [facility] to provide your oncology and hematology care.  If you have a lab appointment with the Cancer Center, please go directly to the Cancer Center and check in at the registration area.  Wear comfortable clothing and clothing appropriate for easy access to any Portacath or PICC line.   We strive to give you quality time with your provider. You may need to reschedule your appointment if you arrive late (15 or more minutes).  Arriving late affects you and other patients whose appointments are after yours.  Also, if you miss three or more appointments without notifying the office, you may be dismissed from the clinic at the provider's discretion.      For prescription refill requests, have your pharmacy contact our office and allow 72 hours for refills to be completed.    Today you received the following chemotherapy and/or immunotherapy agents: Alimta      To help prevent nausea and vomiting after your treatment, we encourage you to take your nausea medication as directed.  BELOW ARE SYMPTOMS THAT SHOULD BE REPORTED IMMEDIATELY: *FEVER GREATER THAN 100.4 F (38 C) OR HIGHER *CHILLS OR SWEATING *NAUSEA AND VOMITING THAT IS NOT CONTROLLED WITH YOUR NAUSEA MEDICATION *UNUSUAL SHORTNESS OF BREATH *UNUSUAL BRUISING OR BLEEDING *URINARY PROBLEMS (pain or burning when urinating, or frequent urination) *BOWEL PROBLEMS (unusual diarrhea, constipation, pain near the anus) TENDERNESS IN MOUTH AND THROAT WITH OR WITHOUT PRESENCE OF ULCERS (sore throat, sores in mouth, or a toothache) UNUSUAL RASH, SWELLING OR PAIN  UNUSUAL VAGINAL DISCHARGE OR ITCHING   Items with * indicate a potential emergency and should be followed up as soon as possible or go to the Emergency Department if any problems should occur.  Please show the CHEMOTHERAPY ALERT CARD or IMMUNOTHERAPY ALERT CARD at check-in to the  Emergency Department and triage nurse.  Should you have questions after your visit or need to cancel or reschedule your appointment, please contact [facility]  [phone] and follow the prompts.  Office hours are 8:00 a.m. to 4:30 p.m. Monday - Friday. Please note that voicemails left after 4:00 p.m. may not be returned until the following business day.  We are closed weekends and major holidays. You have access to a nurse at all times for urgent questions. Please call the main number to the clinic [phone] and follow the prompts.  For any non-urgent questions, you may also contact your provider using MyChart. We now offer e-Visits for anyone 18 and older to request care online for non-urgent symptoms. For details visit [email or web address].   Also download the MyChart app! Go to the app store, search "MyChart", open the app, select [facility], and log in with your MyChart username and password.  Masks are optional in the cancer centers. If you would like for your care team to wear a mask while they are taking care of you, please let them know. For doctor visits, patients may have with them one support person who is at least 60 years old. At this time, visitors are not allowed in the infusion area.   

## 2022-05-18 NOTE — Assessment & Plan Note (Addendum)
#   Adenocarcinoma the lung; stage IV;  SEP 2022- CT No evidence of recurrent or metastatic carcinoma within the chest, abdomen, or pelvis. STABLE  # proceed with Alimta chemotherapy. Labs today reviewed;  acceptable for treatment today. Repeat CT CAP until July 2023;  [pt awaiting for deductibles]; will order imaging at next visit.  # Thoracic aneurysm-approximately 4.5 cm in size; on surveillance-stable. SEP 12th CT scan- STABLE.  Awaiting cardiology evaluation in the next few weeks.  #Chronic fluid retention-as needed Lasix Aldactone.  No worsening of renal function.- Stable.  # port malfunction: fibrin sheath [2022] s/p TPA-; dysfunction noted- OK today.   b12 -6/16- q9W  # DISPOSITION:  # proceed with Alimta; B12 injection # Follow up in 3 weeks MD-labs- cbc/cmp; alimta- -Dr.B

## 2022-05-21 DIAGNOSIS — R011 Cardiac murmur, unspecified: Secondary | ICD-10-CM | POA: Diagnosis not present

## 2022-05-21 DIAGNOSIS — I351 Nonrheumatic aortic (valve) insufficiency: Secondary | ICD-10-CM | POA: Insufficient documentation

## 2022-05-21 DIAGNOSIS — R609 Edema, unspecified: Secondary | ICD-10-CM | POA: Diagnosis not present

## 2022-06-08 ENCOUNTER — Inpatient Hospital Stay (HOSPITAL_BASED_OUTPATIENT_CLINIC_OR_DEPARTMENT_OTHER): Payer: BC Managed Care – PPO | Admitting: Internal Medicine

## 2022-06-08 ENCOUNTER — Inpatient Hospital Stay: Payer: BC Managed Care – PPO

## 2022-06-08 ENCOUNTER — Encounter: Payer: Self-pay | Admitting: Internal Medicine

## 2022-06-08 ENCOUNTER — Inpatient Hospital Stay: Payer: BC Managed Care – PPO | Attending: Internal Medicine

## 2022-06-08 DIAGNOSIS — C3402 Malignant neoplasm of left main bronchus: Secondary | ICD-10-CM | POA: Diagnosis not present

## 2022-06-08 DIAGNOSIS — I712 Thoracic aortic aneurysm, without rupture, unspecified: Secondary | ICD-10-CM | POA: Insufficient documentation

## 2022-06-08 DIAGNOSIS — E538 Deficiency of other specified B group vitamins: Secondary | ICD-10-CM | POA: Insufficient documentation

## 2022-06-08 DIAGNOSIS — Z8679 Personal history of other diseases of the circulatory system: Secondary | ICD-10-CM | POA: Insufficient documentation

## 2022-06-08 DIAGNOSIS — Z806 Family history of leukemia: Secondary | ICD-10-CM | POA: Diagnosis not present

## 2022-06-08 DIAGNOSIS — R609 Edema, unspecified: Secondary | ICD-10-CM | POA: Diagnosis not present

## 2022-06-08 DIAGNOSIS — Z79899 Other long term (current) drug therapy: Secondary | ICD-10-CM | POA: Diagnosis not present

## 2022-06-08 DIAGNOSIS — Z5111 Encounter for antineoplastic chemotherapy: Secondary | ICD-10-CM | POA: Diagnosis not present

## 2022-06-08 LAB — CBC WITH DIFFERENTIAL/PLATELET
Abs Immature Granulocytes: 0.02 10*3/uL (ref 0.00–0.07)
Basophils Absolute: 0 10*3/uL (ref 0.0–0.1)
Basophils Relative: 1 %
Eosinophils Absolute: 0.2 10*3/uL (ref 0.0–0.5)
Eosinophils Relative: 3 %
HCT: 42.8 % (ref 39.0–52.0)
Hemoglobin: 14.7 g/dL (ref 13.0–17.0)
Immature Granulocytes: 0 %
Lymphocytes Relative: 34 %
Lymphs Abs: 1.9 10*3/uL (ref 0.7–4.0)
MCH: 32.3 pg (ref 26.0–34.0)
MCHC: 34.3 g/dL (ref 30.0–36.0)
MCV: 94.1 fL (ref 80.0–100.0)
Monocytes Absolute: 0.5 10*3/uL (ref 0.1–1.0)
Monocytes Relative: 9 %
Neutro Abs: 3 10*3/uL (ref 1.7–7.7)
Neutrophils Relative %: 53 %
Platelets: 168 10*3/uL (ref 150–400)
RBC: 4.55 MIL/uL (ref 4.22–5.81)
RDW: 13 % (ref 11.5–15.5)
WBC: 5.6 10*3/uL (ref 4.0–10.5)
nRBC: 0 % (ref 0.0–0.2)

## 2022-06-08 LAB — COMPREHENSIVE METABOLIC PANEL
ALT: 17 U/L (ref 0–44)
AST: 26 U/L (ref 15–41)
Albumin: 4 g/dL (ref 3.5–5.0)
Alkaline Phosphatase: 42 U/L (ref 38–126)
Anion gap: 7 (ref 5–15)
BUN: 18 mg/dL (ref 6–20)
CO2: 28 mmol/L (ref 22–32)
Calcium: 8.9 mg/dL (ref 8.9–10.3)
Chloride: 100 mmol/L (ref 98–111)
Creatinine, Ser: 1.25 mg/dL — ABNORMAL HIGH (ref 0.61–1.24)
GFR, Estimated: >60 mL/min (ref >60)
Glucose, Bld: 146 mg/dL — ABNORMAL HIGH (ref 70–99)
Potassium: 3.8 mmol/L (ref 3.5–5.1)
Sodium: 135 mmol/L (ref 135–145)
Total Bilirubin: 0.6 mg/dL (ref 0.3–1.2)
Total Protein: 7.2 g/dL (ref 6.5–8.1)

## 2022-06-08 MED ORDER — PROCHLORPERAZINE MALEATE 10 MG PO TABS
10.0000 mg | ORAL_TABLET | Freq: Once | ORAL | Status: AC
  Administered 2022-06-08: 10 mg via ORAL
  Filled 2022-06-08: qty 1

## 2022-06-08 MED ORDER — SODIUM CHLORIDE 0.9 % IV SOLN
500.0000 mg/m2 | Freq: Once | INTRAVENOUS | Status: AC
  Administered 2022-06-08: 1100 mg via INTRAVENOUS
  Filled 2022-06-08: qty 40

## 2022-06-08 MED ORDER — HEPARIN SOD (PORK) LOCK FLUSH 100 UNIT/ML IV SOLN
500.0000 [IU] | Freq: Once | INTRAVENOUS | Status: AC | PRN
  Filled 2022-06-08: qty 5

## 2022-06-08 MED ORDER — DEXAMETHASONE SODIUM PHOSPHATE 10 MG/ML IJ SOLN
6.0000 mg | Freq: Once | INTRAMUSCULAR | Status: AC
  Administered 2022-06-08: 6 mg via INTRAVENOUS
  Filled 2022-06-08: qty 1

## 2022-06-08 MED ORDER — HEPARIN SOD (PORK) LOCK FLUSH 100 UNIT/ML IV SOLN
INTRAVENOUS | Status: AC
  Administered 2022-06-08: 500 [IU]
  Filled 2022-06-08: qty 5

## 2022-06-08 MED ORDER — SODIUM CHLORIDE 0.9 % IV SOLN
Freq: Once | INTRAVENOUS | Status: AC
  Filled 2022-06-08: qty 250

## 2022-06-08 NOTE — Progress Notes (Signed)
[facility] OFFICE PROGRESS NOTE  Patient Care Team: [name], MD as PCP - General (Family Medicine) [name], MD as Consulting Physician (Internal Medicine)   Cancer Staging  No matching staging information was found for the patient.   Oncology History Overview Note  # 2012- METASTATIC ADENO CA of LEFT LUNG [acinar pattern] s/p MED LN [facility]x [NEG- EGFR/K-ras/ALK mutation];[name]; [name]; CT JULY  2016- NED; JAN 2018- NED;   # AUG 2020-clinical trial closed; continue maintenance Alimta  # Thoracic aortic aneurysm-4.0 cm in 2016; currently 4.6 cm.  s/p referral to Dr. Faith Rogue.; MRI in 6 months planned.  ----------------------------------------------------------    DIAGNOSIS: Mitchell.Mosley ] Adenocarcinoma lung  STAGE: 4       ;GOALS: Palliative  CURRENT/MOST RECENT THERAPY '[ ]'  Alimta maintenance    Cancer of hilus of left lung ([place])  07/20/2016 Initial Diagnosis   Cancer of hilus of left lung ([place])   12/11/2019 -  Chemotherapy   Patient is on Treatment Plan : LUNG Pemetrexed (Alimta) q21d       INTERVAL HISTORY: Alone.  Ambulating independently.  Mitchell Mosley 61 y.o.  male pleasant patient above history of metastatic adenocarcinoma the lung-NED currently on maintenance Alimta is here for follow-up.  In the interim status post evaluation with cardiology.  Awaiting 2D echo.  No new shortness of breath or cough he denies any worsening joint pains. Currently  on Lasix/Aldactone for chronic lower extremity swelling.  Review of Systems  Constitutional:  Negative for chills, diaphoresis, fever, malaise/fatigue and weight loss.  HENT:  Negative for nosebleeds and sore throat.   Eyes:  Negative for double vision.  Respiratory:  Negative for hemoptysis, shortness of breath and wheezing.   Cardiovascular:  Positive for leg swelling. Negative for chest  pain, palpitations and orthopnea.  Gastrointestinal:  Negative for abdominal pain, blood in stool, constipation, diarrhea, heartburn, melena, nausea and vomiting.  Genitourinary:  Negative for dysuria, frequency and urgency.  Musculoskeletal:  Positive for joint pain.  Skin: Negative.  Negative for itching and rash.  Neurological:  Negative for dizziness, tingling, focal weakness, weakness and headaches.  Endo/Heme/Allergies:  Does not bruise/bleed easily.  Psychiatric/Behavioral:  Negative for depression. The patient is not nervous/anxious and does not have insomnia.       PAST MEDICAL HISTORY :  Past Medical History:  Diagnosis Date   Allergy    Lung cancer ([place])    Mild valvular heart disease Nov. 2014   per 2 D Echocardiogram done for persistent leg edema, monitored by cardiology   Peripheral edema    Thoracic aortic aneurysm (TAA) ([place])     PAST SURGICAL HISTORY :   Past Surgical History:  Procedure Laterality Date   ELBOW SURGERY Right 2011   IR CV LINE INJECTION  09/11/2021   Left anterior thoracotomy with biopsy  March 2012   PORTACATH PLACEMENT  March 2012   TONSILLECTOMY  as child    FAMILY HISTORY :   Family History  Problem Relation Age of Onset   Leukemia Father    Diabetes Mother     SOCIAL HISTORY:   Social History   Tobacco Use   Smoking status: Never   Smokeless tobacco: Never  Vaping Use   Vaping Use: Never used  Substance Use Topics   Alcohol use: Yes    Alcohol/week: 0.0 standard drinks of alcohol    Comment: "social drinker"  Drug use: No    ALLERGIES:  is allergic to penicillins.  MEDICATIONS:  Current Outpatient Medications  Medication Sig Dispense Refill   fluticasone (FLONASE) 50 MCG/ACT nasal spray Place 2 sprays into both nostrils daily. 16 g 6   folic acid (FOLVITE) 1 MG tablet TAKE 1 TABLET BY MOUTH EVERY DAY 90 tablet 3   furosemide (LASIX) 20 MG tablet TAKE 1 TABLET (20 MG TOTAL) BY MOUTH DAILY AS NEEDED FOR SWELLING 90  tablet 1   Multiple Vitamin (MULTIVITAMIN) capsule Take 1 capsule by mouth daily. Reported on 02/24/2016     NON FORMULARY Relief Factor with vitamin D and Turmeric     Omega-3 Fatty Acids (FISH OIL) 1000 MG CAPS Take 1 capsule by mouth daily. Reported on 02/24/2016     senna (SENOKOT) 8.6 MG tablet Take 1 tablet by mouth daily. Take as needed around time of chemotherapy     spironolactone (ALDACTONE) 25 MG tablet TAKE 1 TABLET BY MOUTH EVERY DAY 90 tablet 3   tadalafil (CIALIS) 20 MG tablet Take one tablet every 2 days as needed 50 tablet 5   vitamin B-12 (CYANOCOBALAMIN) 1000 MCG tablet Take 1,000 mcg by mouth daily.     HYDROcodone-acetaminophen (NORCO) 5-325 MG tablet Take 1 tablet by mouth every 6 (six) hours as needed for moderate pain. (Patient not taking: Reported on 03/16/2022) 30 tablet 0   meloxicam (MOBIC) 15 MG tablet Take 15 mg by mouth daily. (Patient not taking: Reported on 03/16/2022)     sildenafil (VIAGRA) 100 MG tablet TAKE 1 TABLET BY MOUTH EVERY DAY AS NEEDED FOR ERECTILE DYSFUNCTION (Patient not taking: Reported on 03/16/2022) 20 tablet 11   No current facility-administered medications for this visit.   Facility-Administered Medications Ordered in Other Visits  Medication Dose Route Frequency Provider Last Rate Last Admin   heparin lock flush 100 unit/mL  500 Units Intravenous Once Charlaine Dalton R, MD       sodium chloride 0.9 % injection 10 mL  10 mL Intracatheter PRN [name], MD   10 mL at 05/27/15 1543   sodium chloride 0.9 % injection 10 mL  10 mL Intracatheter PRN [name], MD   10 mL at 06/17/15 1510   sodium chloride 0.9 % injection 10 mL  10 mL Intravenous PRN [name], MD   10 mL at 07/08/15 1340   sodium chloride flush (NS) 0.9 % injection 10 mL  10 mL Intravenous PRN Verlon Au, NP   10 mL at 09/30/20 1412    PHYSICAL EXAMINATION: ECOG PERFORMANCE STATUS: 0 - Asymptomatic  BP 120/74 (BP Location: Left Arm, Patient Position:  Sitting, Cuff Size: Normal)   Pulse 85   Temp (!) 97.4 F (36.3 C) (Tympanic)   Ht '5\' 10"'  (1.778 m)   Wt 206 lb (93.4 kg)   SpO2 100%   BMI 29.56 kg/m   Filed Weights   06/08/22 1506  Weight: 206 lb (93.4 kg)    Physical Exam HENT:     Head: Normocephalic and atraumatic.     Mouth/Throat:     Pharynx: No oropharyngeal exudate.  Eyes:     Pupils: Pupils are equal, round, and reactive to light.  Cardiovascular:     Rate and Rhythm: Normal rate and regular rhythm.  Pulmonary:     Effort: No respiratory distress.     Breath sounds: No wheezing.  Abdominal:     General: Bowel sounds are normal. There is no distension.  Palpations: Abdomen is soft. There is no mass.     Tenderness: There is no abdominal tenderness. There is no guarding or rebound.  Musculoskeletal:        General: No tenderness. Normal range of motion.     Cervical back: Normal range of motion and neck supple.     Comments: Grade 1 swelling in the legs bilaterally.  Skin:    General: Skin is warm.  Neurological:     Mental Status: He is alert and oriented to person, place, and time.  Psychiatric:        Mood and Affect: Affect normal.      LABORATORY DATA:  I have reviewed the data as listed    Component Value Date/Time   NA 135 06/08/2022 1452   NA 137 06/19/2021 1121   NA 138 12/10/2014 1341   K 3.8 06/08/2022 1452   K 3.6 12/10/2014 1341   CL 100 06/08/2022 1452   CL 102 12/10/2014 1341   CO2 28 06/08/2022 1452   CO2 30 12/10/2014 1341   GLUCOSE 146 (H) 06/08/2022 1452   GLUCOSE 160 (H) 12/10/2014 1341   BUN 18 06/08/2022 1452   BUN 18 06/19/2021 1121   BUN 21 (H) 12/10/2014 1341   CREATININE 1.25 (H) 06/08/2022 1452   CREATININE 1.10 03/25/2015 1453   CALCIUM 8.9 06/08/2022 1452   CALCIUM 8.4 (L) 12/10/2014 1341   PROT 7.2 06/08/2022 1452   PROT 7.3 06/19/2021 1121   PROT 6.6 12/10/2014 1341   ALBUMIN 4.0 06/08/2022 1452   ALBUMIN 4.5 06/19/2021 1121   ALBUMIN 3.4 12/10/2014  1341   AST 26 06/08/2022 1452   AST 17 12/10/2014 1341   ALT 17 06/08/2022 1452   ALT 23 12/10/2014 1341   ALKPHOS 42 06/08/2022 1452   ALKPHOS 50 12/10/2014 1341   BILITOT 0.6 06/08/2022 1452   BILITOT 0.2 06/19/2021 1121   BILITOT 0.5 12/10/2014 1341   GFRNONAA >60 06/08/2022 1452   GFRNONAA >60 03/25/2015 1453   GFRAA >60 08/19/2020 1324   GFRAA >60 03/25/2015 1453    No results found for: "SPEP", "UPEP"  Lab Results  Component Value Date   WBC 5.6 06/08/2022   NEUTROABS 3.0 06/08/2022   HGB 14.7 06/08/2022   HCT 42.8 06/08/2022   MCV 94.1 06/08/2022   PLT 168 06/08/2022      Chemistry      Component Value Date/Time   NA 135 06/08/2022 1452   NA 137 06/19/2021 1121   NA 138 12/10/2014 1341   K 3.8 06/08/2022 1452   K 3.6 12/10/2014 1341   CL 100 06/08/2022 1452   CL 102 12/10/2014 1341   CO2 28 06/08/2022 1452   CO2 30 12/10/2014 1341   BUN 18 06/08/2022 1452   BUN 18 06/19/2021 1121   BUN 21 (H) 12/10/2014 1341   CREATININE 1.25 (H) 06/08/2022 1452   CREATININE 1.10 03/25/2015 1453      Component Value Date/Time   CALCIUM 8.9 06/08/2022 1452   CALCIUM 8.4 (L) 12/10/2014 1341   ALKPHOS 42 06/08/2022 1452   ALKPHOS 50 12/10/2014 1341   AST 26 06/08/2022 1452   AST 17 12/10/2014 1341   ALT 17 06/08/2022 1452   ALT 23 12/10/2014 1341   BILITOT 0.6 06/08/2022 1452   BILITOT 0.2 06/19/2021 1121   BILITOT 0.5 12/10/2014 1341       RADIOGRAPHIC STUDIES: I have personally reviewed the radiological images as listed and agreed with the findings in the  report. No results found.   ASSESSMENT & PLAN:  Cancer of hilus of left lung ([place]) # Adenocarcinoma the lung; stage IV;  SEP 2022- CT No evidence of recurrent or metastatic carcinoma within the chest, abdomen, or pelvis. STABLE  # proceed with Alimta chemotherapy. Labs today reviewed;  acceptable for treatment today.  Will order imaging/6 weeks.   # Thoracic aneurysm-approximately 4.5 cm in size; on  surveillance-stable. SEP 12th CT scan- STABLE; awaiting 2 D Echo [Dr.Parashoes].  Await repeat imaging in August 2023.  #Chronic fluid retention-as needed Lasix Aldactone.  No worsening of renal function.- STABLE.   # port malfunction: fibrin sheath [2022] s/p TPA-; dysfunction noted- OK today. STABLE.   b12 -6/16- q9W  # DISPOSITION:  # proceed with Alimta;   # Follow up in 3 weeks NP -labs- cbc/cmp; alimta; B12   # Follow up in 6 weeks MD-labs- cbc/cmp; alimta; CT CAP-  -Dr.B    Orders Placed This Encounter  Procedures   CT CHEST ABDOMEN PELVIS W CONTRAST    Standing Status:   Future    Standing Expiration Date:   06/09/2023    Order Specific Question:   Preferred imaging location?    Answer:   [facility]    Order Specific Question:   Radiology Contrast Protocol - do NOT remove file path    Answer:   [email or web address]     All questions were answered. The patient knows to call the clinic with any problems, questions or concerns.      [name], MD 06/08/2022 4:42 PM

## 2022-06-08 NOTE — Assessment & Plan Note (Addendum)
#   Adenocarcinoma the lung; stage IV;  SEP 2022- CT No evidence of recurrent or metastatic carcinoma within the chest, abdomen, or pelvis. STABLE  # proceed with Alimta chemotherapy. Labs today reviewed;  acceptable for treatment today.  Will order imaging/6 weeks.   # Thoracic aneurysm-approximately 4.5 cm in size; on surveillance-stable. SEP 12th CT scan- STABLE; awaiting 2 D Echo [Dr.Parashoes].  Await repeat imaging in August 2023.  #Chronic fluid retention-as needed Lasix Aldactone.  No worsening of renal function.- STABLE.   # port malfunction: fibrin sheath [2022] s/p TPA-; dysfunction noted- OK today. STABLE.   b12 -6/16- q9W  # DISPOSITION:  # proceed with Alimta;   # Follow up in 3 weeks NP -labs- cbc/cmp; alimta; B12   # Follow up in 6 weeks MD-labs- cbc/cmp; alimta; CT CAP-  -Dr.B

## 2022-06-08 NOTE — Patient Instructions (Signed)
[facility] [place]  Discharge Instructions: Thank you for choosing [facility] to provide your oncology and hematology care.  If you have a lab appointment with the Cancer Center, please go directly to the Cancer Center and check in at the registration area.  Wear comfortable clothing and clothing appropriate for easy access to any Portacath or PICC line.   We strive to give you quality time with your provider. You may need to reschedule your appointment if you arrive late (15 or more minutes).  Arriving late affects you and other patients whose appointments are after yours.  Also, if you miss three or more appointments without notifying the office, you may be dismissed from the clinic at the provider's discretion.      For prescription refill requests, have your pharmacy contact our office and allow 72 hours for refills to be completed.    Today you received the following chemotherapy and/or immunotherapy agents: Alimta      To help prevent nausea and vomiting after your treatment, we encourage you to take your nausea medication as directed.  BELOW ARE SYMPTOMS THAT SHOULD BE REPORTED IMMEDIATELY: *FEVER GREATER THAN 100.4 F (38 C) OR HIGHER *CHILLS OR SWEATING *NAUSEA AND VOMITING THAT IS NOT CONTROLLED WITH YOUR NAUSEA MEDICATION *UNUSUAL SHORTNESS OF BREATH *UNUSUAL BRUISING OR BLEEDING *URINARY PROBLEMS (pain or burning when urinating, or frequent urination) *BOWEL PROBLEMS (unusual diarrhea, constipation, pain near the anus) TENDERNESS IN MOUTH AND THROAT WITH OR WITHOUT PRESENCE OF ULCERS (sore throat, sores in mouth, or a toothache) UNUSUAL RASH, SWELLING OR PAIN  UNUSUAL VAGINAL DISCHARGE OR ITCHING   Items with * indicate a potential emergency and should be followed up as soon as possible or go to the Emergency Department if any problems should occur.  Please show the CHEMOTHERAPY ALERT CARD or IMMUNOTHERAPY ALERT CARD at check-in to the  Emergency Department and triage nurse.  Should you have questions after your visit or need to cancel or reschedule your appointment, please contact [facility]  [phone] and follow the prompts.  Office hours are 8:00 a.m. to 4:30 p.m. Monday - Friday. Please note that voicemails left after 4:00 p.m. may not be returned until the following business day.  We are closed weekends and major holidays. You have access to a nurse at all times for urgent questions. Please call the main number to the clinic [phone] and follow the prompts.  For any non-urgent questions, you may also contact your provider using MyChart. We now offer e-Visits for anyone 18 and older to request care online for non-urgent symptoms. For details visit [email or web address].   Also download the MyChart app! Go to the app store, search "MyChart", open the app, select [facility], and log in with your MyChart username and password.  Masks are optional in the cancer centers. If you would like for your care team to wear a mask while they are taking care of you, please let them know. For doctor visits, patients may have with them one support person who is at least 60 years old. At this time, visitors are not allowed in the infusion area.   

## 2022-06-14 ENCOUNTER — Other Ambulatory Visit: Payer: Self-pay | Admitting: Family Medicine

## 2022-06-14 DIAGNOSIS — N529 Male erectile dysfunction, unspecified: Secondary | ICD-10-CM

## 2022-06-18 DIAGNOSIS — R011 Cardiac murmur, unspecified: Secondary | ICD-10-CM | POA: Diagnosis not present

## 2022-06-20 ENCOUNTER — Encounter: Payer: Self-pay | Admitting: Family Medicine

## 2022-06-25 ENCOUNTER — Other Ambulatory Visit: Payer: Self-pay

## 2022-06-25 ENCOUNTER — Encounter: Payer: Self-pay | Admitting: Family Medicine

## 2022-06-26 ENCOUNTER — Other Ambulatory Visit: Payer: Self-pay

## 2022-06-27 DIAGNOSIS — I351 Nonrheumatic aortic (valve) insufficiency: Secondary | ICD-10-CM | POA: Diagnosis not present

## 2022-06-27 DIAGNOSIS — R609 Edema, unspecified: Secondary | ICD-10-CM | POA: Diagnosis not present

## 2022-06-27 DIAGNOSIS — I7121 Aneurysm of the ascending aorta, without rupture: Secondary | ICD-10-CM | POA: Diagnosis not present

## 2022-06-28 ENCOUNTER — Other Ambulatory Visit: Payer: Self-pay

## 2022-06-29 ENCOUNTER — Inpatient Hospital Stay (HOSPITAL_BASED_OUTPATIENT_CLINIC_OR_DEPARTMENT_OTHER): Payer: BC Managed Care – PPO | Admitting: Nurse Practitioner

## 2022-06-29 ENCOUNTER — Encounter: Payer: Self-pay | Admitting: Nurse Practitioner

## 2022-06-29 ENCOUNTER — Inpatient Hospital Stay: Payer: BC Managed Care – PPO

## 2022-06-29 VITALS — BP 136/86 | HR 81 | Temp 96.6°F | Resp 16 | Ht 70.0 in | Wt 217.0 lb

## 2022-06-29 DIAGNOSIS — C3402 Malignant neoplasm of left main bronchus: Secondary | ICD-10-CM | POA: Diagnosis not present

## 2022-06-29 DIAGNOSIS — Z806 Family history of leukemia: Secondary | ICD-10-CM | POA: Diagnosis not present

## 2022-06-29 DIAGNOSIS — Z8679 Personal history of other diseases of the circulatory system: Secondary | ICD-10-CM | POA: Diagnosis not present

## 2022-06-29 DIAGNOSIS — Z79899 Other long term (current) drug therapy: Secondary | ICD-10-CM | POA: Diagnosis not present

## 2022-06-29 DIAGNOSIS — I712 Thoracic aortic aneurysm, without rupture, unspecified: Secondary | ICD-10-CM | POA: Diagnosis not present

## 2022-06-29 DIAGNOSIS — Z5111 Encounter for antineoplastic chemotherapy: Secondary | ICD-10-CM

## 2022-06-29 DIAGNOSIS — E538 Deficiency of other specified B group vitamins: Secondary | ICD-10-CM | POA: Diagnosis not present

## 2022-06-29 DIAGNOSIS — R609 Edema, unspecified: Secondary | ICD-10-CM | POA: Diagnosis not present

## 2022-06-29 LAB — CBC WITH DIFFERENTIAL/PLATELET
Abs Immature Granulocytes: 0.01 K/uL (ref 0.00–0.07)
Basophils Absolute: 0 K/uL (ref 0.0–0.1)
Basophils Relative: 1 %
Eosinophils Absolute: 0.2 K/uL (ref 0.0–0.5)
Eosinophils Relative: 3 %
HCT: 39.7 % (ref 39.0–52.0)
Hemoglobin: 13.7 g/dL (ref 13.0–17.0)
Immature Granulocytes: 0 %
Lymphocytes Relative: 35 %
Lymphs Abs: 1.8 K/uL (ref 0.7–4.0)
MCH: 32.5 pg (ref 26.0–34.0)
MCHC: 34.5 g/dL (ref 30.0–36.0)
MCV: 94.1 fL (ref 80.0–100.0)
Monocytes Absolute: 0.6 K/uL (ref 0.1–1.0)
Monocytes Relative: 11 %
Neutro Abs: 2.6 K/uL (ref 1.7–7.7)
Neutrophils Relative %: 50 %
Platelets: 158 K/uL (ref 150–400)
RBC: 4.22 MIL/uL (ref 4.22–5.81)
RDW: 13.2 % (ref 11.5–15.5)
WBC: 5.3 K/uL (ref 4.0–10.5)
nRBC: 0 % (ref 0.0–0.2)

## 2022-06-29 LAB — COMPREHENSIVE METABOLIC PANEL WITH GFR
ALT: 15 U/L (ref 0–44)
AST: 27 U/L (ref 15–41)
Albumin: 3.9 g/dL (ref 3.5–5.0)
Alkaline Phosphatase: 43 U/L (ref 38–126)
Anion gap: 7 (ref 5–15)
BUN: 17 mg/dL (ref 6–20)
CO2: 27 mmol/L (ref 22–32)
Calcium: 8.9 mg/dL (ref 8.9–10.3)
Chloride: 104 mmol/L (ref 98–111)
Creatinine, Ser: 1.11 mg/dL (ref 0.61–1.24)
GFR, Estimated: >60 mL/min
Glucose, Bld: 175 mg/dL — ABNORMAL HIGH (ref 70–99)
Potassium: 3.8 mmol/L (ref 3.5–5.1)
Sodium: 138 mmol/L (ref 135–145)
Total Bilirubin: 0.6 mg/dL (ref 0.3–1.2)
Total Protein: 7 g/dL (ref 6.5–8.1)

## 2022-06-29 MED ORDER — PROCHLORPERAZINE MALEATE 10 MG PO TABS
10.0000 mg | ORAL_TABLET | Freq: Once | ORAL | Status: AC
  Administered 2022-06-29: 10 mg via ORAL
  Filled 2022-06-29: qty 1

## 2022-06-29 MED ORDER — DEXAMETHASONE SODIUM PHOSPHATE 10 MG/ML IJ SOLN
6.0000 mg | Freq: Once | INTRAMUSCULAR | Status: AC
  Administered 2022-06-29: 6 mg via INTRAVENOUS
  Filled 2022-06-29: qty 1

## 2022-06-29 MED ORDER — HEPARIN SOD (PORK) LOCK FLUSH 100 UNIT/ML IV SOLN
500.0000 [IU] | Freq: Once | INTRAVENOUS | Status: AC | PRN
  Administered 2022-06-29: 500 [IU]
  Filled 2022-06-29: qty 5

## 2022-06-29 MED ORDER — SODIUM CHLORIDE 0.9 % IV SOLN
500.0000 mg/m2 | Freq: Once | INTRAVENOUS | Status: AC
  Administered 2022-06-29: 1100 mg via INTRAVENOUS
  Filled 2022-06-29: qty 40

## 2022-06-29 MED ORDER — SODIUM CHLORIDE 0.9 % IV SOLN
Freq: Once | INTRAVENOUS | Status: AC
  Filled 2022-06-29: qty 250

## 2022-06-29 NOTE — Patient Instructions (Signed)
[facility] AT [place]  Discharge Instructions: Thank you for choosing [facility] to provide your oncology and hematology care.  If you have a lab appointment with the [facility], please go directly to the [facility] and check in at the registration area.  Wear comfortable clothing and clothing appropriate for easy access to any Portacath or PICC line.   We strive to give you quality time with your provider. You may need to reschedule your appointment if you arrive late (15 or more minutes).  Arriving late affects you and other patients whose appointments are after yours.  Also, if you miss three or more appointments without notifying the office, you may be dismissed from the clinic at the provider's discretion.      For prescription refill requests, have your pharmacy contact our office and allow 72 hours for refills to be completed.    Today you received the following chemotherapy and/or immunotherapy agents alimta      To help prevent nausea and vomiting after your treatment, we encourage you to take your nausea medication as directed.  BELOW ARE SYMPTOMS THAT SHOULD BE REPORTED IMMEDIATELY: *FEVER GREATER THAN 100.4 F (38 C) OR HIGHER *CHILLS OR SWEATING *NAUSEA AND VOMITING THAT IS NOT CONTROLLED WITH YOUR NAUSEA MEDICATION *UNUSUAL SHORTNESS OF BREATH *UNUSUAL BRUISING OR BLEEDING *URINARY PROBLEMS (pain or burning when urinating, or frequent urination) *BOWEL PROBLEMS (unusual diarrhea, constipation, pain near the anus) TENDERNESS IN MOUTH AND THROAT WITH OR WITHOUT PRESENCE OF ULCERS (sore throat, sores in mouth, or a toothache) UNUSUAL RASH, SWELLING OR PAIN  UNUSUAL VAGINAL DISCHARGE OR ITCHING   Items with * indicate a potential emergency and should be followed up as soon as possible or go to the Emergency Department if any problems should occur.  Please show the CHEMOTHERAPY ALERT CARD or IMMUNOTHERAPY ALERT CARD at check-in to the  Emergency Department and triage nurse.  Should you have questions after your visit or need to cancel or reschedule your appointment, please contact [facility] [place] AT [place]  [phone] and follow the prompts.  Office hours are 8:00 a.m. to 4:30 p.m. Monday - Friday. Please note that voicemails left after 4:00 p.m. may not be returned until the following business day.  We are closed weekends and major holidays. You have access to a nurse at all times for urgent questions. Please call the main number to the clinic [phone] and follow the prompts.  For any non-urgent questions, you may also contact your provider using MyChart. We now offer e-Visits for anyone 60 and older to request care online for non-urgent symptoms. For details visit [email or web address].   Also download the MyChart app! Go to the app store, search "MyChart", open the app, select [facility], and log in with your MyChart username and password.  Masks are optional in the cancer centers. If you would like for your care team to wear a mask while they are taking care of you, please let them know. For doctor visits, patients may have with them one support person who is at least 60 years old. At this time, visitors are not allowed in the infusion area.

## 2022-06-29 NOTE — Progress Notes (Signed)
[facility] OFFICE PROGRESS NOTE  Patient Care Team: [name], MD as PCP - General (Family Medicine) [name], MD as Consulting Physician (Internal Medicine)   Cancer Staging  No matching staging information was found for the patient.  Oncology History Overview Note  # 2012- METASTATIC ADENO CA of LEFT LUNG [acinar pattern] s/p MED LN Bx [NEG- EGFR/K-ras/ALK mutation];[name]; [name]; CT JULY  2016- NED; JAN 2018- NED;   # AUG 2020-clinical trial closed; continue maintenance Alimta  # Thoracic aortic aneurysm-4.0 cm in 2016; currently 4.6 cm.  s/p referral to Dr. Faith Rogue.; MRI in 6 months planned.  ----------------------------------------------------------    DIAGNOSIS: Mitchell Mosley ] Adenocarcinoma lung  STAGE: 4       ;GOALS: Palliative  CURRENT/MOST RECENT THERAPY [ ] Alimta maintenance    Cancer of hilus of left lung ([place])  07/20/2016 Initial Diagnosis   Cancer of hilus of left lung ([place])   12/11/2019 -  Chemotherapy   Patient is on Treatment Plan : LUNG Pemetrexed (Alimta) q21d       INTERVAL HISTORY: Alone.  Ambulating independently.  Mitchell Mosley 61 y.o.  male pleasant patient above history of metastatic adenocarcinoma the lung-NED currently on maintenance Alimta is here for follow-up. No new shortness of breath, cough, or hemoptysis. No joint pains. Continues lasix/aldactone for chronic lower extremity swelling. Continues to be active.   Review of Systems  Constitutional:  Negative for chills, diaphoresis, fever, malaise/fatigue and weight loss.  HENT:  Negative for nosebleeds and sore throat.   Eyes:  Negative for double vision.  Respiratory:  Negative for hemoptysis, shortness of breath and wheezing.   Cardiovascular:  Negative for chest pain, palpitations, orthopnea and leg swelling.  Gastrointestinal:  Negative for abdominal  pain, blood in stool, constipation, diarrhea, heartburn, melena, nausea and vomiting.  Genitourinary:  Negative for dysuria, frequency and urgency.  Musculoskeletal:  Negative for joint pain.  Skin: Negative.  Negative for itching and rash.  Neurological:  Negative for dizziness, tingling, focal weakness, weakness and headaches.  Endo/Heme/Allergies:  Does not bruise/bleed easily.  Psychiatric/Behavioral:  Negative for depression. The patient is not nervous/anxious and does not have insomnia.    PAST MEDICAL HISTORY :  Past Medical History:  Diagnosis Date   Allergy    Lung cancer ([place])    Mild valvular heart disease Nov. 2014   per 2 D Echocardiogram done for persistent leg edema, monitored by cardiology   Peripheral edema    Thoracic aortic aneurysm (TAA) ([place])     PAST SURGICAL HISTORY :   Past Surgical History:  Procedure Laterality Date   ELBOW SURGERY Right 2011   IR CV LINE INJECTION  09/11/2021   Left anterior thoracotomy with biopsy  March 2012   PORTACATH PLACEMENT  March 2012   TONSILLECTOMY  as child    FAMILY HISTORY :   Family History  Problem Relation Age of Onset   Leukemia Father    Diabetes Mother     SOCIAL HISTORY:   Social History   Tobacco Use   Smoking status: Never   Smokeless tobacco: Never  Vaping Use   Vaping Use: Never used  Substance Use Topics   Alcohol use: Yes    Alcohol/week: 0.0 standard drinks of alcohol    Comment: "social drinker"    Drug use: No    ALLERGIES:  is allergic to penicillins.  MEDICATIONS:  Current  Outpatient Medications  Medication Sig Dispense Refill   fluticasone (FLONASE) 50 MCG/ACT nasal spray Place 2 sprays into both nostrils daily. 16 g 6   folic acid (FOLVITE) 1 MG tablet TAKE 1 TABLET BY MOUTH EVERY DAY 90 tablet 3   furosemide (LASIX) 20 MG tablet TAKE 1 TABLET (20 MG TOTAL) BY MOUTH DAILY AS NEEDED FOR SWELLING 90 tablet 1   Multiple Vitamin (MULTIVITAMIN) capsule Take 1 capsule by mouth daily.  Reported on 02/24/2016     NON FORMULARY Relief Factor with vitamin D and Turmeric     Omega-3 Fatty Acids (FISH OIL) 1000 MG CAPS Take 1 capsule by mouth daily. Reported on 02/24/2016     senna (SENOKOT) 8.6 MG tablet Take 1 tablet by mouth daily. Take as needed around time of chemotherapy     sildenafil (VIAGRA) 100 MG tablet TAKE 1 TABLET BY MOUTH EVERY DAY AS NEEDED FOR ERECTILE DYSFUNCTION 15 tablet 11   spironolactone (ALDACTONE) 25 MG tablet TAKE 1 TABLET BY MOUTH EVERY DAY 90 tablet 3   tadalafil (CIALIS) 20 MG tablet Take one tablet every 2 days as needed 50 tablet 5   vitamin B-12 (CYANOCOBALAMIN) 1000 MCG tablet Take 1,000 mcg by mouth daily.     HYDROcodone-acetaminophen (NORCO) 5-325 MG tablet Take 1 tablet by mouth every 6 (six) hours as needed for moderate pain. (Patient not taking: Reported on 03/16/2022) 30 tablet 0   meloxicam (MOBIC) 15 MG tablet Take 15 mg by mouth daily. (Patient not taking: Reported on 03/16/2022)     No current facility-administered medications for this visit.   Facility-Administered Medications Ordered in Other Visits  Medication Dose Route Frequency Provider Last Rate Last Admin   heparin lock flush 100 unit/mL  500 Units Intravenous Once Charlaine Dalton R, MD       sodium chloride 0.9 % injection 10 mL  10 mL Intracatheter PRN [name], MD   10 mL at 05/27/15 1543   sodium chloride 0.9 % injection 10 mL  10 mL Intracatheter PRN [name], MD   10 mL at 06/17/15 1510   sodium chloride 0.9 % injection 10 mL  10 mL Intravenous PRN [name], MD   10 mL at 07/08/15 1340   sodium chloride flush (NS) 0.9 % injection 10 mL  10 mL Intravenous PRN Verlon Au, NP   10 mL at 09/30/20 1412    PHYSICAL EXAMINATION: ECOG PERFORMANCE STATUS: 0 - Asymptomatic  BP 136/86 (BP Location: Left Arm, Patient Position: Sitting, Cuff Size: Normal)   Pulse 81   Temp (!) 96.6 F (35.9 C) (Tympanic)   Resp 16   Ht 5' 10" (1.778 m)   Wt 217 lb (98.4  kg)   SpO2 100%   BMI 31.14 kg/m   Filed Weights   06/29/22 1410  Weight: 217 lb (98.4 kg)    Physical Exam Constitutional:      Appearance: He is not ill-appearing.  Eyes:     General: No scleral icterus.    Conjunctiva/sclera: Conjunctivae normal.  Cardiovascular:     Rate and Rhythm: Normal rate and regular rhythm.  Abdominal:     General: There is no distension.     Palpations: Abdomen is soft.     Tenderness: There is no abdominal tenderness. There is no guarding.  Musculoskeletal:        General: No deformity.     Right lower leg: No edema.     Left lower leg: No edema.  Lymphadenopathy:  Cervical: No cervical adenopathy.  Skin:    General: Skin is warm and dry.  Neurological:     Mental Status: He is alert and oriented to person, place, and time. Mental status is at baseline.  Psychiatric:        Mood and Affect: Mood normal.        Behavior: Behavior normal.    LABORATORY DATA:  I have reviewed the data as listed Lab Results  Component Value Date   WBC 5.3 06/29/2022   NEUTROABS 2.6 06/29/2022   HGB 13.7 06/29/2022   HCT 39.7 06/29/2022   MCV 94.1 06/29/2022   PLT 158 06/29/2022      Chemistry      Component Value Date/Time   NA 138 06/29/2022 1354   NA 137 06/19/2021 1121   NA 138 12/10/2014 1341   K 3.8 06/29/2022 1354   K 3.6 12/10/2014 1341   CL 104 06/29/2022 1354   CL 102 12/10/2014 1341   CO2 27 06/29/2022 1354   CO2 30 12/10/2014 1341   BUN 17 06/29/2022 1354   BUN 18 06/19/2021 1121   BUN 21 (H) 12/10/2014 1341   CREATININE 1.11 06/29/2022 1354   CREATININE 1.10 03/25/2015 1453      Component Value Date/Time   CALCIUM 8.9 06/29/2022 1354   CALCIUM 8.4 (L) 12/10/2014 1341   ALKPHOS 43 06/29/2022 1354   ALKPHOS 50 12/10/2014 1341   AST 27 06/29/2022 1354   AST 17 12/10/2014 1341   ALT 15 06/29/2022 1354   ALT 23 12/10/2014 1341   BILITOT 0.6 06/29/2022 1354   BILITOT 0.2 06/19/2021 1121   BILITOT 0.5 12/10/2014 1341        RADIOGRAPHIC STUDIES: I have personally reviewed the radiological images as listed and agreed with the findings in the report. No results found.   ASSESSMENT & PLAN:  No problem-specific Assessment & Plan notes found for this encounter.  Cancer of hilus of left lung ([place]) # Adenocarcinoma the lung; stage IV;  SEP 2022- CT No evidence of recurrent or metastatic carcinoma within the chest, abdomen, or pelvis. STABLE   # Proceed with Alimta chemotherapy. Labs today reviewed;  acceptable for treatment today. Imaging scheduled for 3 weeks.    # Thoracic aneurysm-approximately 4.5 cm in size; on surveillance-stable. SEP 12th CT scan- STABLE; awaiting 2 D Echo [Dr.Parashoes].  Await repeat imaging in August 2023.   #Chronic fluid retention-as needed Lasix Aldactone. No worsening of renal function.- STABLE.    # port malfunction: fibrin sheath [2022] s/p TPA-; dysfunction noted- OK today. STABLE.    b12 -6/16- q9W (August 2023 appointment)   DISPOSITION:  Proceed with Alimta 3 weeks - labs- (cbc/cmp), Dr. Rogue Bussing, alimta, b12 - la  No orders of the defined types were placed in this encounter.  All questions were answered. The patient knows to call the clinic with any problems, questions or concerns.   Verlon Au, NP 06/29/2022 2:41 PM

## 2022-07-07 ENCOUNTER — Other Ambulatory Visit: Payer: Self-pay

## 2022-07-16 ENCOUNTER — Ambulatory Visit
Admission: RE | Admit: 2022-07-16 | Discharge: 2022-07-16 | Disposition: A | Discharge status: Home / Self Care | Payer: BC Managed Care – PPO | Source: Ambulatory Visit | Attending: Internal Medicine | Admitting: Internal Medicine

## 2022-07-16 DIAGNOSIS — I712 Thoracic aortic aneurysm, without rupture, unspecified: Secondary | ICD-10-CM | POA: Diagnosis not present

## 2022-07-16 DIAGNOSIS — I700 Atherosclerosis of aorta: Secondary | ICD-10-CM | POA: Diagnosis not present

## 2022-07-16 DIAGNOSIS — M4316 Spondylolisthesis, lumbar region: Secondary | ICD-10-CM | POA: Diagnosis not present

## 2022-07-16 DIAGNOSIS — J984 Other disorders of lung: Secondary | ICD-10-CM | POA: Diagnosis not present

## 2022-07-16 DIAGNOSIS — C3402 Malignant neoplasm of left main bronchus: Secondary | ICD-10-CM | POA: Insufficient documentation

## 2022-07-16 DIAGNOSIS — C3490 Malignant neoplasm of unspecified part of unspecified bronchus or lung: Secondary | ICD-10-CM | POA: Diagnosis not present

## 2022-07-16 MED ORDER — IOHEXOL 300 MG/ML  SOLN
85.0000 mL | Freq: Once | INTRAMUSCULAR | Status: AC | PRN
  Administered 2022-07-16: 85 mL via INTRAVENOUS

## 2022-07-19 ENCOUNTER — Other Ambulatory Visit: Payer: BC Managed Care – PPO

## 2022-07-20 ENCOUNTER — Other Ambulatory Visit: Payer: BC Managed Care – PPO

## 2022-07-20 ENCOUNTER — Inpatient Hospital Stay (HOSPITAL_BASED_OUTPATIENT_CLINIC_OR_DEPARTMENT_OTHER): Payer: BC Managed Care – PPO | Admitting: Internal Medicine

## 2022-07-20 ENCOUNTER — Encounter: Payer: Self-pay | Admitting: Internal Medicine

## 2022-07-20 ENCOUNTER — Inpatient Hospital Stay: Payer: BC Managed Care – PPO | Attending: Internal Medicine

## 2022-07-20 ENCOUNTER — Ambulatory Visit: Payer: BC Managed Care – PPO | Admitting: Internal Medicine

## 2022-07-20 ENCOUNTER — Inpatient Hospital Stay: Payer: BC Managed Care – PPO

## 2022-07-20 ENCOUNTER — Ambulatory Visit: Payer: BC Managed Care – PPO

## 2022-07-20 VITALS — BP 117/77 | HR 74 | Temp 98.0°F | Ht 70.0 in | Wt 211.6 lb

## 2022-07-20 DIAGNOSIS — I712 Thoracic aortic aneurysm, without rupture, unspecified: Secondary | ICD-10-CM | POA: Insufficient documentation

## 2022-07-20 DIAGNOSIS — E538 Deficiency of other specified B group vitamins: Secondary | ICD-10-CM | POA: Insufficient documentation

## 2022-07-20 DIAGNOSIS — M7989 Other specified soft tissue disorders: Secondary | ICD-10-CM | POA: Diagnosis not present

## 2022-07-20 DIAGNOSIS — C3402 Malignant neoplasm of left main bronchus: Secondary | ICD-10-CM

## 2022-07-20 DIAGNOSIS — R609 Edema, unspecified: Secondary | ICD-10-CM | POA: Insufficient documentation

## 2022-07-20 DIAGNOSIS — Z5111 Encounter for antineoplastic chemotherapy: Secondary | ICD-10-CM

## 2022-07-20 LAB — CBC WITH DIFFERENTIAL/PLATELET
Abs Immature Granulocytes: 0.02 10*3/uL (ref 0.00–0.07)
Basophils Absolute: 0 10*3/uL (ref 0.0–0.1)
Basophils Relative: 0 %
Eosinophils Absolute: 0.1 10*3/uL (ref 0.0–0.5)
Eosinophils Relative: 2 %
HCT: 40.5 % (ref 39.0–52.0)
Hemoglobin: 13.7 g/dL (ref 13.0–17.0)
Immature Granulocytes: 0 %
Lymphocytes Relative: 36 %
Lymphs Abs: 1.8 10*3/uL (ref 0.7–4.0)
MCH: 32.3 pg (ref 26.0–34.0)
MCHC: 33.8 g/dL (ref 30.0–36.0)
MCV: 95.5 fL (ref 80.0–100.0)
Monocytes Absolute: 0.6 10*3/uL (ref 0.1–1.0)
Monocytes Relative: 12 %
Neutro Abs: 2.5 10*3/uL (ref 1.7–7.7)
Neutrophils Relative %: 50 %
Platelets: 145 10*3/uL — ABNORMAL LOW (ref 150–400)
RBC: 4.24 MIL/uL (ref 4.22–5.81)
RDW: 13.2 % (ref 11.5–15.5)
WBC: 5.1 10*3/uL (ref 4.0–10.5)
nRBC: 0 % (ref 0.0–0.2)

## 2022-07-20 LAB — COMPREHENSIVE METABOLIC PANEL
ALT: 19 U/L (ref 0–44)
AST: 25 U/L (ref 15–41)
Albumin: 4 g/dL (ref 3.5–5.0)
Alkaline Phosphatase: 37 U/L — ABNORMAL LOW (ref 38–126)
Anion gap: 5 (ref 5–15)
BUN: 24 mg/dL — ABNORMAL HIGH (ref 6–20)
CO2: 27 mmol/L (ref 22–32)
Calcium: 8.7 mg/dL — ABNORMAL LOW (ref 8.9–10.3)
Chloride: 105 mmol/L (ref 98–111)
Creatinine, Ser: 1.05 mg/dL (ref 0.61–1.24)
GFR, Estimated: >60 mL/min (ref >60)
Glucose, Bld: 131 mg/dL — ABNORMAL HIGH (ref 70–99)
Potassium: 3.5 mmol/L (ref 3.5–5.1)
Sodium: 137 mmol/L (ref 135–145)
Total Bilirubin: 1 mg/dL (ref 0.3–1.2)
Total Protein: 7.5 g/dL (ref 6.5–8.1)

## 2022-07-20 MED ORDER — HEPARIN SOD (PORK) LOCK FLUSH 100 UNIT/ML IV SOLN
500.0000 [IU] | Freq: Once | INTRAVENOUS | Status: AC | PRN
  Administered 2022-07-20: 500 [IU]
  Filled 2022-07-20: qty 5

## 2022-07-20 MED ORDER — PROCHLORPERAZINE MALEATE 10 MG PO TABS
10.0000 mg | ORAL_TABLET | Freq: Once | ORAL | Status: AC
  Administered 2022-07-20: 10 mg via ORAL
  Filled 2022-07-20: qty 1

## 2022-07-20 MED ORDER — SODIUM CHLORIDE 0.9 % IV SOLN: 6.0000 mg | Freq: Once | INTRAVENOUS | Status: DC

## 2022-07-20 MED ORDER — SODIUM CHLORIDE 0.9 % IV SOLN
500.0000 mg/m2 | Freq: Once | INTRAVENOUS | Status: AC
  Administered 2022-07-20: 1100 mg via INTRAVENOUS
  Filled 2022-07-20: qty 4

## 2022-07-20 MED ORDER — SODIUM CHLORIDE 0.9 % IV SOLN
Freq: Once | INTRAVENOUS | Status: AC
  Filled 2022-07-20: qty 250

## 2022-07-20 MED ORDER — DEXAMETHASONE SODIUM PHOSPHATE 10 MG/ML IJ SOLN
6.0000 mg | Freq: Once | INTRAMUSCULAR | Status: AC
  Administered 2022-07-20: 6 mg via INTRAVENOUS
  Filled 2022-07-20: qty 1

## 2022-07-20 NOTE — Assessment & Plan Note (Addendum)
#   Adenocarcinoma the lung; stage IV;  CT JULY 2023- CT No evidence of recurrent or metastatic carcinoma within the chest, abdomen, or pelvis. STABLE  # proceed with Alimta chemotherapy. Labs today reviewed;  acceptable for treatment today.   # Thoracic aneurysm-approximately 4.5 cm in size; on surveillance; CT JULY 2023-4.7 cm.  Previously followed by Dr. Faith Rogue.  Currently none.  Recommend referral to Dr. Lucky Cowboy.  #Chronic fluid retention-as needed Lasix Aldactone.  No worsening of renal function.- STABLE.   # port malfunction: fibrin sheath [2022] s/p TPA-; dysfunction noted- OK today. STABLE.  b12 -7/28- q9W  # DISPOSITION:  # referral to Dr.Dew re: thoracic aneurysm   # proceed with Alimta today   # Follow up in 3 weeks MD-labs- cbc/cmp; Alimta ; B12 injection -Dr.B

## 2022-07-20 NOTE — Progress Notes (Signed)
[facility] OFFICE PROGRESS NOTE  Patient Care Team: [name], MD as PCP - General (Family Medicine) [name], MD as Consulting Physician (Internal Medicine)   Cancer Staging  No matching staging information was found for the patient.   Oncology History Overview Note  # 2012- METASTATIC ADENO CA of LEFT LUNG [acinar pattern] s/p MED LN Bx [NEG- EGFR/K-ras/ALK mutation];[name]; [name]; CT JULY  2016- NED; JAN 2018- NED;   # AUG 2020-clinical trial closed; continue maintenance Alimta  # Thoracic aortic aneurysm-4.0 cm in 2016; currently 4.6 cm.  s/p referral to Dr. Faith Rogue.; MRI in 6 months planned.  ----------------------------------------------------------    DIAGNOSIS: [name]  STAGE: 4       ;GOALS: Palliative  CURRENT/MOST RECENT THERAPY '[ ]'  Alimta maintenance    Cancer of hilus of left lung ([place])  07/20/2016 Initial Diagnosis   Cancer of hilus of left lung ([place])   12/11/2019 - 06/29/2022 Chemotherapy   Patient is on Treatment Plan : LUNG Pemetrexed (Alimta) q21d      12/11/2019 -  Chemotherapy   Patient is on Treatment Plan : LUNG Pemetrexed (500) q21d       INTERVAL HISTORY: Alone.  Ambulating independently.  Mitchell Mosley 61 y.o.  male pleasant patient above history of metastatic adenocarcinoma the lung-NED currently on maintenance Alimta is here for follow-up/review results of the CT scan.  In the interim status post evaluation with cardiology. S/p 2D echo.  No new shortness of breath or cough he denies any worsening joint pains. Currently  on Lasix/Aldactone for chronic lower extremity swelling.  Review of Systems  Constitutional:  Negative for chills, diaphoresis, fever, malaise/fatigue and weight loss.  HENT:  Negative for nosebleeds and sore throat.   Eyes:  Negative for double vision.  Respiratory:   Negative for hemoptysis, shortness of breath and wheezing.   Cardiovascular:  Positive for leg swelling. Negative for chest pain, palpitations and orthopnea.  Gastrointestinal:  Negative for abdominal pain, blood in stool, constipation, diarrhea, heartburn, melena, nausea and vomiting.  Genitourinary:  Negative for dysuria, frequency and urgency.  Musculoskeletal:  Positive for joint pain.  Skin: Negative.  Negative for itching and rash.  Neurological:  Negative for dizziness, tingling, focal weakness, weakness and headaches.  Endo/Heme/Allergies:  Does not bruise/bleed easily.  Psychiatric/Behavioral:  Negative for depression. The patient is not nervous/anxious and does not have insomnia.       PAST MEDICAL HISTORY :  Past Medical History:  Diagnosis Date   Allergy    Lung cancer ([place])    Mild valvular heart disease Nov. 2014   per 2 D Echocardiogram done for persistent leg edema, monitored by cardiology   Peripheral edema    Thoracic aortic aneurysm (TAA) ([place])     PAST SURGICAL HISTORY :   Past Surgical History:  Procedure Laterality Date   ELBOW SURGERY Right 2011   IR CV LINE INJECTION  09/11/2021   Left anterior thoracotomy with biopsy  March 2012   PORTACATH PLACEMENT  March 2012   TONSILLECTOMY  as child    FAMILY HISTORY :   Family History  Problem Relation Age of Onset   Leukemia Father    Diabetes Mother     SOCIAL HISTORY:   Social History   Tobacco Use   Smoking status: Never   Smokeless tobacco: Never  Vaping Use   Vaping Use: Never  used  Substance Use Topics   Alcohol use: Yes    Alcohol/week: 0.0 standard drinks of alcohol    Comment: "social drinker"    Drug use: No    ALLERGIES:  is allergic to penicillins.  MEDICATIONS:  Current Outpatient Medications  Medication Sig Dispense Refill   fluticasone (FLONASE) 50 MCG/ACT nasal spray Place 2 sprays into both nostrils daily. 16 g 6   folic acid (FOLVITE) 1 MG tablet TAKE 1 TABLET BY MOUTH  EVERY DAY 90 tablet 3   furosemide (LASIX) 20 MG tablet TAKE 1 TABLET (20 MG TOTAL) BY MOUTH DAILY AS NEEDED FOR SWELLING 90 tablet 1   Multiple Vitamin (MULTIVITAMIN) capsule Take 1 capsule by mouth daily. Reported on 02/24/2016     NON FORMULARY Relief Factor with vitamin D and Turmeric     Omega-3 Fatty Acids (FISH OIL) 1000 MG CAPS Take 1 capsule by mouth daily. Reported on 02/24/2016     senna (SENOKOT) 8.6 MG tablet Take 1 tablet by mouth daily. Take as needed around time of chemotherapy     sildenafil (VIAGRA) 100 MG tablet TAKE 1 TABLET BY MOUTH EVERY DAY AS NEEDED FOR ERECTILE DYSFUNCTION 15 tablet 11   spironolactone (ALDACTONE) 25 MG tablet TAKE 1 TABLET BY MOUTH EVERY DAY 90 tablet 3   tadalafil (CIALIS) 20 MG tablet Take one tablet every 2 days as needed 50 tablet 5   vitamin B-12 (CYANOCOBALAMIN) 1000 MCG tablet Take 1,000 mcg by mouth daily.     HYDROcodone-acetaminophen (NORCO) 5-325 MG tablet Take 1 tablet by mouth every 6 (six) hours as needed for moderate pain. (Patient not taking: Reported on 03/16/2022) 30 tablet 0   meloxicam (MOBIC) 15 MG tablet Take 15 mg by mouth daily. (Patient not taking: Reported on 03/16/2022)     No current facility-administered medications for this visit.   Facility-Administered Medications Ordered in Other Visits  Medication Dose Route Frequency Provider Last Rate Last Admin   dexamethasone (DECADRON) injection 6 mg  6 mg Intravenous Once Charlaine Dalton R, MD       heparin lock flush 100 unit/mL  500 Units Intravenous Once Charlaine Dalton R, MD       heparin lock flush 100 unit/mL  500 Units Intracatheter Once PRN [name], MD       PEMEtrexed (ALIMTA) 1,100 mg in sodium chloride 0.9 % 100 mL chemo infusion  500 mg/m2 (Order-Specific) Intravenous Once Charlaine Dalton R, MD       sodium chloride 0.9 % injection 10 mL  10 mL Intracatheter PRN [name], MD   10 mL at 05/27/15 1543   sodium chloride 0.9 % injection 10 mL   10 mL Intracatheter PRN [name], MD   10 mL at 06/17/15 1510   sodium chloride 0.9 % injection 10 mL  10 mL Intravenous PRN [name], MD   10 mL at 07/08/15 1340   sodium chloride flush (NS) 0.9 % injection 10 mL  10 mL Intravenous PRN Verlon Au, NP   10 mL at 09/30/20 1412    PHYSICAL EXAMINATION: ECOG PERFORMANCE STATUS: 0 - Asymptomatic  BP 117/77 (BP Location: Left Arm, Patient Position: Sitting, Cuff Size: Normal)   Pulse 74   Temp 98 F (36.7 C) (Tympanic)   Ht '5\' 10"'  (1.778 m)   Wt 211 lb 9.6 oz (96 kg)   SpO2 100%   BMI 30.36 kg/m   Filed Weights   07/20/22 1311  Weight: 211 lb 9.6 oz (  96 kg)    Physical Exam HENT:     Head: Normocephalic and atraumatic.     Mouth/Throat:     Pharynx: No oropharyngeal exudate.  Eyes:     Pupils: Pupils are equal, round, and reactive to light.  Cardiovascular:     Rate and Rhythm: Normal rate and regular rhythm.  Pulmonary:     Effort: No respiratory distress.     Breath sounds: No wheezing.  Abdominal:     General: Bowel sounds are normal. There is no distension.     Palpations: Abdomen is soft. There is no mass.     Tenderness: There is no abdominal tenderness. There is no guarding or rebound.  Musculoskeletal:        General: No tenderness. Normal range of motion.     Cervical back: Normal range of motion and neck supple.     Comments: Grade 1 swelling in the legs bilaterally.  Skin:    General: Skin is warm.  Neurological:     Mental Status: He is alert and oriented to person, place, and time.  Psychiatric:        Mood and Affect: Affect normal.      LABORATORY DATA:  I have reviewed the data as listed    Component Value Date/Time   NA 137 07/20/2022 1254   NA 137 06/19/2021 1121   NA 138 12/10/2014 1341   K 3.5 07/20/2022 1254   K 3.6 12/10/2014 1341   CL 105 07/20/2022 1254   CL 102 12/10/2014 1341   CO2 27 07/20/2022 1254   CO2 30 12/10/2014 1341   GLUCOSE 131 (H) 07/20/2022 1254    GLUCOSE 160 (H) 12/10/2014 1341   BUN 24 (H) 07/20/2022 1254   BUN 18 06/19/2021 1121   BUN 21 (H) 12/10/2014 1341   CREATININE 1.05 07/20/2022 1254   CREATININE 1.10 03/25/2015 1453   CALCIUM 8.7 (L) 07/20/2022 1254   CALCIUM 8.4 (L) 12/10/2014 1341   PROT 7.5 07/20/2022 1254   PROT 7.3 06/19/2021 1121   PROT 6.6 12/10/2014 1341   ALBUMIN 4.0 07/20/2022 1254   ALBUMIN 4.5 06/19/2021 1121   ALBUMIN 3.4 12/10/2014 1341   AST 25 07/20/2022 1254   AST 17 12/10/2014 1341   ALT 19 07/20/2022 1254   ALT 23 12/10/2014 1341   ALKPHOS 37 (L) 07/20/2022 1254   ALKPHOS 50 12/10/2014 1341   BILITOT 1.0 07/20/2022 1254   BILITOT 0.2 06/19/2021 1121   BILITOT 0.5 12/10/2014 1341   GFRNONAA >60 07/20/2022 1254   GFRNONAA >60 03/25/2015 1453   GFRAA >60 08/19/2020 1324   GFRAA >60 03/25/2015 1453    No results found for: "SPEP", "UPEP"  Lab Results  Component Value Date   WBC 5.1 07/20/2022   NEUTROABS 2.5 07/20/2022   HGB 13.7 07/20/2022   HCT 40.5 07/20/2022   MCV 95.5 07/20/2022   PLT 145 (L) 07/20/2022      Chemistry      Component Value Date/Time   NA 137 07/20/2022 1254   NA 137 06/19/2021 1121   NA 138 12/10/2014 1341   K 3.5 07/20/2022 1254   K 3.6 12/10/2014 1341   CL 105 07/20/2022 1254   CL 102 12/10/2014 1341   CO2 27 07/20/2022 1254   CO2 30 12/10/2014 1341   BUN 24 (H) 07/20/2022 1254   BUN 18 06/19/2021 1121   BUN 21 (H) 12/10/2014 1341   CREATININE 1.05 07/20/2022 1254   CREATININE 1.10 03/25/2015 1453  Component Value Date/Time   CALCIUM 8.7 (L) 07/20/2022 1254   CALCIUM 8.4 (L) 12/10/2014 1341   ALKPHOS 37 (L) 07/20/2022 1254   ALKPHOS 50 12/10/2014 1341   AST 25 07/20/2022 1254   AST 17 12/10/2014 1341   ALT 19 07/20/2022 1254   ALT 23 12/10/2014 1341   BILITOT 1.0 07/20/2022 1254   BILITOT 0.2 06/19/2021 1121   BILITOT 0.5 12/10/2014 1341       RADIOGRAPHIC STUDIES: I have personally reviewed the radiological images as listed and  agreed with the findings in the report. No results found.   ASSESSMENT & PLAN:  Cancer of hilus of left lung ([place]) # Adenocarcinoma the lung; stage IV;  CT JULY 2023- CT No evidence of recurrent or metastatic carcinoma within the chest, abdomen, or pelvis. STABLE  # proceed with Alimta chemotherapy. Labs today reviewed;  acceptable for treatment today.   # Thoracic aneurysm-approximately 4.5 cm in size; on surveillance; CT JULY 2023-4.7 cm.  Previously followed by Dr. Faith Rogue.  Currently none.  Recommend referral to Dr. Lucky Cowboy.  #Chronic fluid retention-as needed Lasix Aldactone.  No worsening of renal function.- STABLE.   # port malfunction: fibrin sheath [2022] s/p TPA-; dysfunction noted- OK today. STABLE.  b12 -7/28- q9W  # DISPOSITION:  # referral to Dr.Dew re: thoracic aneurysm   # proceed with Alimta today   # Follow up in 3 weeks MD-labs- cbc/cmp; Alimta ; B12 injection -Dr.B    Orders Placed This Encounter  Procedures   CBC with Differential    Standing Status:   Future    Standing Expiration Date:   07/21/2023   Comprehensive metabolic panel    Standing Status:   Future    Standing Expiration Date:   07/21/2023   CBC with Differential    Standing Status:   Future    Standing Expiration Date:   08/11/2023   Comprehensive metabolic panel    Standing Status:   Future    Standing Expiration Date:   08/11/2023   Ambulatory referral to Vascular Surgery    Referral Priority:   Routine    Referral Type:   Surgical    Referral Reason:   Specialty Services Required    Referred to Provider:   Algernon Huxley, MD    Requested Specialty:   Vascular Surgery    Number of Visits Requested:   1     All questions were answered. The patient knows to call the clinic with any problems, questions or concerns.      [name], MD 07/20/2022 2:18 PM

## 2022-07-20 NOTE — Progress Notes (Signed)
ON PATHWAY REGIMEN - Non-Small Cell Lung  No Change  Continue With Treatment as Ordered.  Original Decision Date/Time: 10/19/2019 16:46     A cycle is every 21 days:     Pemetrexed   **Always confirm dose/schedule in your pharmacy ordering system**  Patient Characteristics: Stage IV Metastatic, Nonsquamous, Maintenance - Chemotherapy/Immunotherapy, PS = 0, 1, Initial Pemetrexed + Platinum Agent AJCC T Category: T3 Current Disease Status: Distant Metastases AJCC N Category: N2 AJCC M Category: M1b AJCC 8 Stage Grouping: IVA Histology: Nonsquamous Cell ROS1 Rearrangement Status: Awaiting Test Results T790M Mutation Status: Not Applicable - EGFR Mutation Negative/Unknown Other Mutations/Biomarkers: No Other Actionable Mutations Chemotherapy/Immunotherapy LOT: Maintenance Chemotherapy/Immunotherapy Molecular Targeted Therapy: Not Appropriate MET Exon 14 Mutation Status: Awaiting Test Results RET Gene Fusion Status: Awaiting Test Results EGFR Mutation Status: Awaiting Test Results NTRK Gene Fusion Status: Awaiting Test Results PD-L1 Expression Status: Awaiting Test Results ALK Rearrangement Status: Awaiting Test Results BRAF V600E Mutation Status: Awaiting Test Results ECOG Performance Status: 0 Intent of Therapy: Non-Curative / Palliative Intent, Discussed with Patient

## 2022-07-20 NOTE — Patient Instructions (Signed)
[facility] AT [place]  Discharge Instructions: Thank you for choosing [facility] to provide your oncology and hematology care.  If you have a lab appointment with the [facility], please go directly to the [facility] and check in at the registration area.  Wear comfortable clothing and clothing appropriate for easy access to any Portacath or PICC line.   We strive to give you quality time with your provider. You may need to reschedule your appointment if you arrive late (15 or more minutes).  Arriving late affects you and other patients whose appointments are after yours.  Also, if you miss three or more appointments without notifying the office, you may be dismissed from the clinic at the provider's discretion.      For prescription refill requests, have your pharmacy contact our office and allow 72 hours for refills to be completed.    Today you received the following chemotherapy and/or immunotherapy agents ALIMTA      To help prevent nausea and vomiting after your treatment, we encourage you to take your nausea medication as directed.  BELOW ARE SYMPTOMS THAT SHOULD BE REPORTED IMMEDIATELY: *FEVER GREATER THAN 100.4 F (38 C) OR HIGHER *CHILLS OR SWEATING *NAUSEA AND VOMITING THAT IS NOT CONTROLLED WITH YOUR NAUSEA MEDICATION *UNUSUAL SHORTNESS OF BREATH *UNUSUAL BRUISING OR BLEEDING *URINARY PROBLEMS (pain or burning when urinating, or frequent urination) *BOWEL PROBLEMS (unusual diarrhea, constipation, pain near the anus) TENDERNESS IN MOUTH AND THROAT WITH OR WITHOUT PRESENCE OF ULCERS (sore throat, sores in mouth, or a toothache) UNUSUAL RASH, SWELLING OR PAIN  UNUSUAL VAGINAL DISCHARGE OR ITCHING   Items with * indicate a potential emergency and should be followed up as soon as possible or go to the Emergency Department if any problems should occur.  Please show the CHEMOTHERAPY ALERT CARD or IMMUNOTHERAPY ALERT CARD at check-in to the  Emergency Department and triage nurse.  Should you have questions after your visit or need to cancel or reschedule your appointment, please contact [facility] [place] AT [place]  [phone] and follow the prompts.  Office hours are 8:00 a.m. to 4:30 p.m. Monday - Friday. Please note that voicemails left after 4:00 p.m. may not be returned until the following business day.  We are closed weekends and major holidays. You have access to a nurse at all times for urgent questions. Please call the main number to the clinic [phone] and follow the prompts.  For any non-urgent questions, you may also contact your provider using MyChart. We now offer e-Visits for anyone 55 and older to request care online for non-urgent symptoms. For details visit [email or web address].   Also download the MyChart app! Go to the app store, search "MyChart", open the app, select [facility], and log in with your MyChart username and password.  Masks are optional in the cancer centers. If you would like for your care team to wear a mask while they are taking care of you, please let them know. For doctor visits, patients may have with them one support person who is at least 60 years old. At this time, visitors are not allowed in the infusion area.  Pemetrexed Injection What is this medication? PEMETREXED (PEM e TREX ed) treats some types of cancer. It works by slowing down the growth of cancer cells. This medicine may be used for other purposes; ask your health care provider or pharmacist if you have questions. COMMON BRAND NAME(S): Alimta, PEMFEXY What should I tell my care  team before I take this medication? They need to know if you have any of these conditions: Infection, such as chickenpox, cold sores, or herpes Kidney disease Low blood cell levels (white cells, red cells, and platelets) Lung or breathing disease, such as asthma Radiation therapy An unusual or allergic reaction to pemetrexed,  other medications, foods, dyes, or preservatives If you or your partner are pregnant or trying to get pregnant Breast-feeding How should I use this medication? This medication is injected into a vein. It is given by your care team in a hospital or clinic setting. Talk to your care team about the use of this medication in children. Special care may be needed. Overdosage: If you think you have taken too much of this medicine contact a poison control center or emergency room at once. NOTE: This medicine is only for you. Do not share this medicine with others. What if I miss a dose? Keep appointments for follow-up doses. It is important not to miss your dose. Call your care team if you are unable to keep an appointment. What may interact with this medication? Do not take this medication with any of the following: Live virus vaccines This medication may also interact with the following: Ibuprofen This list may not describe all possible interactions. Give your health care provider a list of all the medicines, herbs, non-prescription drugs, or dietary supplements you use. Also tell them if you smoke, drink alcohol, or use illegal drugs. Some items may interact with your medicine. What should I watch for while using this medication? Your condition will be monitored carefully while you are receiving this medication. This medication may make you feel generally unwell. This is not uncommon as chemotherapy can affect healthy cells as well as cancer cells. Report any side effects. Continue your course of treatment even though you feel ill unless your care team tells you to stop. This medication can cause serious side effects. To reduce the risk, your care team may give you other medications to take before receiving this one. Be sure to follow the directions from your care team. This medication can cause a rash or redness in areas of the body that have previously had radiation therapy. If you have had  radiation therapy, tell your care team if you notice a rash in this area. This medication may increase your risk of getting an infection. Call your care team for advice if you get a fever, chills, sore throat, or other symptoms of a cold or flu. Do not treat yourself. Try to avoid being around people who are sick. Be careful brushing or flossing your teeth or using a toothpick because you may get an infection or bleed more easily. If you have any dental work done, tell your dentist you are receiving this medication. Avoid taking medications that contain aspirin, acetaminophen, ibuprofen, naproxen, or ketoprofen unless instructed by your care team. These medications may hide a fever. Check with your care team if you have severe diarrhea, nausea, and vomiting, or if you sweat a lot. The loss of too much body fluid may make it dangerous for you to take this medication. Talk to your care team if you or your partner wish to become pregnant or think either of you might be pregnant. This medication can cause serious birth defects if taken during pregnancy and for 6 months after the last dose. A negative pregnancy test is required before starting this medication. A reliable form of contraception is recommended while taking this medication and  for 6 months after the last dose. Talk to your care team about reliable forms of contraception. Do not father a child while taking this medication and for 3 months after the last dose. Use a condom while having sex during this time period. Do not breastfeed while taking this medication and for 1 week after the last dose. This medication may cause infertility. Talk to your care team if you are concerned about your fertility. What side effects may I notice from receiving this medication? Side effects that you should report to your care team as soon as possible: Allergic reactions--skin rash, itching, hives, swelling of the face, lips, tongue, or throat Dry cough, shortness of  breath or trouble breathing Infection--fever, chills, cough, sore throat, wounds that don't heal, pain or trouble when passing urine, general feeling of discomfort or being unwell Kidney injury--decrease in the amount of urine, swelling of the ankles, hands, or feet Low red blood cell level--unusual weakness or fatigue, dizziness, headache, trouble breathing Redness, blistering, peeling, or loosening of the skin, including inside the mouth Unusual bruising or bleeding Side effects that usually do not require medical attention (report to your care team if they continue or are bothersome): Fatigue Loss of appetite Nausea Vomiting This list may not describe all possible side effects. Call your doctor for medical advice about side effects. You may report side effects to FDA at 1-800-FDA-1088. Where should I keep my medication? This medication is given in a hospital or clinic. It will not be stored at home. NOTE: This sheet is a summary. It may not cover all possible information. If you have questions about this medicine, talk to your doctor, pharmacist, or health care provider.  2023 Elsevier/Gold Standard (2022-03-26 00:00:00)

## 2022-07-21 ENCOUNTER — Other Ambulatory Visit: Payer: Self-pay

## 2022-07-25 ENCOUNTER — Other Ambulatory Visit: Payer: Self-pay

## 2022-08-07 ENCOUNTER — Telehealth: Payer: Self-pay

## 2022-08-07 NOTE — Patient Outreach (Signed)
  Care Coordination   08/07/2022 Name: Mitchell Mosley MRN: [id number] DOB: 30-Jun-1961   Care Coordination Outreach Attempts:  An unsuccessful telephone outreach was attempted today to offer the patient information about available care coordination services as a benefit of their health plan.   Follow Up Plan:  Additional outreach attempts will be made to offer the patient care coordination information and services.   Encounter Outcome:  No Answer  Care Coordination Interventions Activated:  No   Care Coordination Interventions:  No, not indicated    Noreene Larsson RN, MSN, CCM Community Care Coordinator [facility] Mobile: [phone]

## 2022-08-10 ENCOUNTER — Encounter: Payer: Self-pay | Admitting: Internal Medicine

## 2022-08-10 ENCOUNTER — Inpatient Hospital Stay: Payer: BC Managed Care – PPO

## 2022-08-10 ENCOUNTER — Inpatient Hospital Stay: Payer: BC Managed Care – PPO | Attending: Internal Medicine

## 2022-08-10 ENCOUNTER — Inpatient Hospital Stay (HOSPITAL_BASED_OUTPATIENT_CLINIC_OR_DEPARTMENT_OTHER): Payer: BC Managed Care – PPO | Admitting: Internal Medicine

## 2022-08-10 ENCOUNTER — Inpatient Hospital Stay: Payer: BC Managed Care – PPO | Admitting: Internal Medicine

## 2022-08-10 VITALS — BP 127/74 | HR 87 | Temp 96.9°F | Ht 70.0 in | Wt 212.4 lb

## 2022-08-10 DIAGNOSIS — Z5111 Encounter for antineoplastic chemotherapy: Secondary | ICD-10-CM | POA: Insufficient documentation

## 2022-08-10 DIAGNOSIS — C3402 Malignant neoplasm of left main bronchus: Secondary | ICD-10-CM

## 2022-08-10 DIAGNOSIS — E538 Deficiency of other specified B group vitamins: Secondary | ICD-10-CM | POA: Insufficient documentation

## 2022-08-10 DIAGNOSIS — R609 Edema, unspecified: Secondary | ICD-10-CM | POA: Diagnosis not present

## 2022-08-10 DIAGNOSIS — D696 Thrombocytopenia, unspecified: Secondary | ICD-10-CM

## 2022-08-10 DIAGNOSIS — I712 Thoracic aortic aneurysm, without rupture, unspecified: Secondary | ICD-10-CM | POA: Diagnosis not present

## 2022-08-10 LAB — CBC WITH DIFFERENTIAL/PLATELET
Abs Immature Granulocytes: 0.02 10*3/uL (ref 0.00–0.07)
Basophils Absolute: 0.1 10*3/uL (ref 0.0–0.1)
Basophils Relative: 1 %
Eosinophils Absolute: 0.2 10*3/uL (ref 0.0–0.5)
Eosinophils Relative: 3 %
HCT: 41 % (ref 39.0–52.0)
Hemoglobin: 14 g/dL (ref 13.0–17.0)
Immature Granulocytes: 0 %
Lymphocytes Relative: 26 %
Lymphs Abs: 1.6 10*3/uL (ref 0.7–4.0)
MCH: 32.7 pg (ref 26.0–34.0)
MCHC: 34.1 g/dL (ref 30.0–36.0)
MCV: 95.8 fL (ref 80.0–100.0)
Monocytes Absolute: 0.6 10*3/uL (ref 0.1–1.0)
Monocytes Relative: 9 %
Neutro Abs: 3.7 10*3/uL (ref 1.7–7.7)
Neutrophils Relative %: 61 %
Platelets: 181 10*3/uL (ref 150–400)
RBC: 4.28 MIL/uL (ref 4.22–5.81)
RDW: 13.2 % (ref 11.5–15.5)
WBC: 6.1 10*3/uL (ref 4.0–10.5)
nRBC: 0 % (ref 0.0–0.2)

## 2022-08-10 LAB — COMPREHENSIVE METABOLIC PANEL
ALT: 15 U/L (ref 0–44)
AST: 27 U/L (ref 15–41)
Albumin: 3.9 g/dL (ref 3.5–5.0)
Alkaline Phosphatase: 47 U/L (ref 38–126)
Anion gap: 7 (ref 5–15)
BUN: 21 mg/dL — ABNORMAL HIGH (ref 6–20)
CO2: 25 mmol/L (ref 22–32)
Calcium: 9 mg/dL (ref 8.9–10.3)
Chloride: 104 mmol/L (ref 98–111)
Creatinine, Ser: 1.06 mg/dL (ref 0.61–1.24)
GFR, Estimated: >60 mL/min (ref >60)
Glucose, Bld: 186 mg/dL — ABNORMAL HIGH (ref 70–99)
Potassium: 3.4 mmol/L — ABNORMAL LOW (ref 3.5–5.1)
Sodium: 136 mmol/L (ref 135–145)
Total Bilirubin: 0.5 mg/dL (ref 0.3–1.2)
Total Protein: 7.2 g/dL (ref 6.5–8.1)

## 2022-08-10 MED ORDER — SODIUM CHLORIDE 0.9 % IV SOLN: 6.0000 mg | Freq: Once | INTRAVENOUS | Status: DC

## 2022-08-10 MED ORDER — CYANOCOBALAMIN 1000 MCG/ML IJ SOLN
1000.0000 ug | Freq: Once | INTRAMUSCULAR | Status: AC
  Administered 2022-08-10: 1000 ug via INTRAMUSCULAR
  Filled 2022-08-10: qty 1

## 2022-08-10 MED ORDER — HEPARIN SOD (PORK) LOCK FLUSH 100 UNIT/ML IV SOLN
INTRAVENOUS | Status: AC
  Administered 2022-08-10: 500 [IU]
  Filled 2022-08-10: qty 5

## 2022-08-10 MED ORDER — HEPARIN SOD (PORK) LOCK FLUSH 100 UNIT/ML IV SOLN
500.0000 [IU] | Freq: Once | INTRAVENOUS | Status: AC | PRN
  Filled 2022-08-10: qty 5

## 2022-08-10 MED ORDER — PROCHLORPERAZINE MALEATE 10 MG PO TABS
10.0000 mg | ORAL_TABLET | Freq: Once | ORAL | Status: AC
  Administered 2022-08-10: 10 mg via ORAL
  Filled 2022-08-10: qty 1

## 2022-08-10 MED ORDER — DEXAMETHASONE SODIUM PHOSPHATE 10 MG/ML IJ SOLN
6.0000 mg | Freq: Once | INTRAMUSCULAR | Status: AC
  Administered 2022-08-10: 6 mg via INTRAVENOUS
  Filled 2022-08-10: qty 1

## 2022-08-10 MED ORDER — SODIUM CHLORIDE 0.9 % IV SOLN
Freq: Once | INTRAVENOUS | Status: AC
  Filled 2022-08-10: qty 250

## 2022-08-10 MED ORDER — SODIUM CHLORIDE 0.9 % IV SOLN
500.0000 mg/m2 | Freq: Once | INTRAVENOUS | Status: AC
  Administered 2022-08-10: 1100 mg via INTRAVENOUS
  Filled 2022-08-10: qty 4

## 2022-08-10 NOTE — Assessment & Plan Note (Addendum)
#   Adenocarcinoma the lung; stage IV;  CT JULY 2023- CT No evidence of recurrent or metastatic carcinoma within the chest, abdomen, or pelvis. STABLE  # proceed with Alimta chemotherapy. Labs today reviewed;  acceptable for treatment today.   # Thoracic aneurysm-approximately 4.5 cm in size; on surveillance; CT JULY 2023-4.7 cm.  Previously followed by Dr. Faith Rogue.  Currently none.  Awaiting referral with Dr. Lucky Cowboy.  # Chronic fluid retention-as needed Lasix Aldactone.  No worsening of renal function.- STABLE.   # port malfunction: fibrin sheath [2022] s/p TPA-; dysfunction noted- OK today. STABLE.  b12 -9/8- q9W;   # DISPOSITION:   # proceed with Alimta today; B12 injection   # Follow up on OCT 2nd- MD-labs- cbc/cmp; Alimta-Dr.B

## 2022-08-10 NOTE — Progress Notes (Unsigned)
Mitchell Mosley OFFICE PROGRESS NOTE  Patient Care Team: [name], MD as PCP - General (Family Medicine) [name], MD as Consulting Physician (Internal Medicine)   Cancer Staging  No matching staging information was found for the patient.   Oncology History Overview Note  # 2012- METASTATIC ADENO CA of LEFT LUNG [acinar pattern] s/p MED LN Bx [NEG- EGFR/K-ras/ALK mutation];[name]; [name]; CT JULY  2016- NED; JAN 2018- NED;   # AUG 2020-clinical trial closed; continue maintenance Alimta  # Thoracic aortic aneurysm-4.0 cm in 2016; currently 4.6 cm.  s/p referral to Dr. Faith Rogue.; MRI in 6 months planned.  ----------------------------------------------------------    DIAGNOSIS: Mitchell.Mosley ] Adenocarcinoma lung  STAGE: 4       ;GOALS: Palliative  CURRENT/MOST RECENT THERAPY '[ ]'  Alimta maintenance    Cancer of hilus of left lung ([place])  07/20/2016 Initial Diagnosis   Cancer of hilus of left lung ([place])   12/11/2019 - 06/29/2022 Chemotherapy   Patient is on Treatment Plan : LUNG Pemetrexed (Alimta) q21d      12/11/2019 -  Chemotherapy   Patient is on Treatment Plan : LUNG Pemetrexed (500) q21d       INTERVAL HISTORY: Alone.  Ambulating independently.  Mitchell Mosley 61 y.o.  male pleasant patient above history of metastatic adenocarcinoma the lung-NED currently on maintenance Alimta is here for follow-up.   No new shortness of breath or cough he denies any worsening joint pains. Currently  on Lasix/Aldactone for chronic lower extremity swelling.  Review of Systems  Constitutional:  Negative for chills, diaphoresis, fever, malaise/fatigue and weight loss.  HENT:  Negative for nosebleeds and sore throat.   Eyes:  Negative for double vision.  Respiratory:  Negative for hemoptysis, shortness of breath and wheezing.   Cardiovascular:  Positive for leg  swelling. Negative for chest pain, palpitations and orthopnea.  Gastrointestinal:  Negative for abdominal pain, blood in stool, constipation, diarrhea, heartburn, melena, nausea and vomiting.  Genitourinary:  Negative for dysuria, frequency and urgency.  Musculoskeletal:  Positive for joint pain.  Skin: Negative.  Negative for itching and rash.  Neurological:  Negative for dizziness, tingling, focal weakness, weakness and headaches.  Endo/Heme/Allergies:  Does not bruise/bleed easily.  Psychiatric/Behavioral:  Negative for depression. The patient is not nervous/anxious and does not have insomnia.       PAST MEDICAL HISTORY :  Past Medical History:  Diagnosis Date   Allergy    Lung cancer ([place])    Mild valvular heart disease Nov. 2014   per 2 D Echocardiogram done for persistent leg edema, monitored by cardiology   Peripheral edema    Thoracic aortic aneurysm (TAA) ([place])     PAST SURGICAL HISTORY :   Past Surgical History:  Procedure Laterality Date   ELBOW SURGERY Right 2011   IR CV LINE INJECTION  09/11/2021   Left anterior thoracotomy with biopsy  March 2012   PORTACATH PLACEMENT  March 2012   TONSILLECTOMY  as child    FAMILY HISTORY :   Family History  Problem Relation Age of Onset   Leukemia Father    Diabetes Mother     SOCIAL HISTORY:   Social History   Tobacco Use   Smoking status: Never   Smokeless tobacco: Never  Vaping Use   Vaping Use: Never used  Substance Use Topics   Alcohol use: Yes    Alcohol/week: 0.0 standard  drinks of alcohol    Comment: "social drinker"    Drug use: No    ALLERGIES:  is allergic to penicillins.  MEDICATIONS:  Current Outpatient Medications  Medication Sig Dispense Refill   fluticasone (FLONASE) 50 MCG/ACT nasal spray Place 2 sprays into both nostrils daily. 16 g 6   folic acid (FOLVITE) 1 MG tablet TAKE 1 TABLET BY MOUTH EVERY DAY 90 tablet 3   furosemide (LASIX) 20 MG tablet TAKE 1 TABLET (20 MG TOTAL) BY MOUTH DAILY  AS NEEDED FOR SWELLING 90 tablet 1   Multiple Vitamin (MULTIVITAMIN) capsule Take 1 capsule by mouth daily. Reported on 02/24/2016     NON FORMULARY Relief Factor with vitamin D and Turmeric     Omega-3 Fatty Acids (FISH OIL) 1000 MG CAPS Take 1 capsule by mouth daily. Reported on 02/24/2016     senna (SENOKOT) 8.6 MG tablet Take 1 tablet by mouth daily. Take as needed around time of chemotherapy     sildenafil (VIAGRA) 100 MG tablet TAKE 1 TABLET BY MOUTH EVERY DAY AS NEEDED FOR ERECTILE DYSFUNCTION 15 tablet 11   spironolactone (ALDACTONE) 25 MG tablet TAKE 1 TABLET BY MOUTH EVERY DAY 90 tablet 3   tadalafil (CIALIS) 20 MG tablet Take one tablet every 2 days as needed 50 tablet 5   vitamin B-12 (CYANOCOBALAMIN) 1000 MCG tablet Take 1,000 mcg by mouth daily.     HYDROcodone-acetaminophen (NORCO) 5-325 MG tablet Take 1 tablet by mouth every 6 (six) hours as needed for moderate pain. (Patient not taking: Reported on 03/16/2022) 30 tablet 0   meloxicam (MOBIC) 15 MG tablet Take 15 mg by mouth daily. (Patient not taking: Reported on 03/16/2022)     No current facility-administered medications for this visit.   Facility-Administered Medications Ordered in Other Visits  Medication Dose Route Frequency Provider Last Rate Last Admin   heparin lock flush 100 unit/mL  500 Units Intravenous Once Charlaine Dalton R, MD       sodium chloride 0.9 % injection 10 mL  10 mL Intracatheter PRN [name], MD   10 mL at 05/27/15 1543   sodium chloride 0.9 % injection 10 mL  10 mL Intracatheter PRN [name], MD   10 mL at 06/17/15 1510   sodium chloride 0.9 % injection 10 mL  10 mL Intravenous PRN [name], MD   10 mL at 07/08/15 1340   sodium chloride flush (NS) 0.9 % injection 10 mL  10 mL Intravenous PRN Verlon Au, NP   10 mL at 09/30/20 1412    PHYSICAL EXAMINATION: ECOG PERFORMANCE STATUS: 0 - Asymptomatic  BP 127/74 (BP Location: Left Arm, Patient Position: Sitting, Cuff Size:  Normal)   Pulse 87   Temp (!) 96.9 F (36.1 C) (Tympanic)   Ht '5\' 10"'  (1.778 m)   Wt 212 lb 6.4 oz (96.3 kg)   SpO2 99%   BMI 30.48 kg/m   Filed Weights   08/10/22 1434  Weight: 212 lb 6.4 oz (96.3 kg)     Physical Exam HENT:     Head: Normocephalic and atraumatic.     Mouth/Throat:     Pharynx: No oropharyngeal exudate.  Eyes:     Pupils: Pupils are equal, round, and reactive to light.  Cardiovascular:     Rate and Rhythm: Normal rate and regular rhythm.  Pulmonary:     Effort: No respiratory distress.     Breath sounds: No wheezing.  Abdominal:     General: Bowel  sounds are normal. There is no distension.     Palpations: Abdomen is soft. There is no mass.     Tenderness: There is no abdominal tenderness. There is no guarding or rebound.  Musculoskeletal:        General: No tenderness. Normal range of motion.     Cervical back: Normal range of motion and neck supple.     Comments: Grade 1 swelling in the legs bilaterally.  Skin:    General: Skin is warm.  Neurological:     Mental Status: He is alert and oriented to person, place, and time.  Psychiatric:        Mood and Affect: Affect normal.      LABORATORY DATA:  I have reviewed the data as listed    Component Value Date/Time   NA 136 08/10/2022 1425   NA 137 06/19/2021 1121   NA 138 12/10/2014 1341   K 3.4 (L) 08/10/2022 1425   K 3.6 12/10/2014 1341   CL 104 08/10/2022 1425   CL 102 12/10/2014 1341   CO2 25 08/10/2022 1425   CO2 30 12/10/2014 1341   GLUCOSE 186 (H) 08/10/2022 1425   GLUCOSE 160 (H) 12/10/2014 1341   BUN 21 (H) 08/10/2022 1425   BUN 18 06/19/2021 1121   BUN 21 (H) 12/10/2014 1341   CREATININE 1.06 08/10/2022 1425   CREATININE 1.10 03/25/2015 1453   CALCIUM 9.0 08/10/2022 1425   CALCIUM 8.4 (L) 12/10/2014 1341   PROT 7.2 08/10/2022 1425   PROT 7.3 06/19/2021 1121   PROT 6.6 12/10/2014 1341   ALBUMIN 3.9 08/10/2022 1425   ALBUMIN 4.5 06/19/2021 1121   ALBUMIN 3.4 12/10/2014  1341   AST 27 08/10/2022 1425   AST 17 12/10/2014 1341   ALT 15 08/10/2022 1425   ALT 23 12/10/2014 1341   ALKPHOS 47 08/10/2022 1425   ALKPHOS 50 12/10/2014 1341   BILITOT 0.5 08/10/2022 1425   BILITOT 0.2 06/19/2021 1121   BILITOT 0.5 12/10/2014 1341   GFRNONAA >60 08/10/2022 1425   GFRNONAA >60 03/25/2015 1453   GFRAA >60 08/19/2020 1324   GFRAA >60 03/25/2015 1453    No results found for: "SPEP", "UPEP"  Lab Results  Component Value Date   WBC 6.1 08/10/2022   NEUTROABS 3.7 08/10/2022   HGB 14.0 08/10/2022   HCT 41.0 08/10/2022   MCV 95.8 08/10/2022   PLT 181 08/10/2022      Chemistry      Component Value Date/Time   NA 136 08/10/2022 1425   NA 137 06/19/2021 1121   NA 138 12/10/2014 1341   K 3.4 (L) 08/10/2022 1425   K 3.6 12/10/2014 1341   CL 104 08/10/2022 1425   CL 102 12/10/2014 1341   CO2 25 08/10/2022 1425   CO2 30 12/10/2014 1341   BUN 21 (H) 08/10/2022 1425   BUN 18 06/19/2021 1121   BUN 21 (H) 12/10/2014 1341   CREATININE 1.06 08/10/2022 1425   CREATININE 1.10 03/25/2015 1453      Component Value Date/Time   CALCIUM 9.0 08/10/2022 1425   CALCIUM 8.4 (L) 12/10/2014 1341   ALKPHOS 47 08/10/2022 1425   ALKPHOS 50 12/10/2014 1341   AST 27 08/10/2022 1425   AST 17 12/10/2014 1341   ALT 15 08/10/2022 1425   ALT 23 12/10/2014 1341   BILITOT 0.5 08/10/2022 1425   BILITOT 0.2 06/19/2021 1121   BILITOT 0.5 12/10/2014 1341       RADIOGRAPHIC STUDIES: I have personally  reviewed the radiological images as listed and agreed with the findings in the report. No results found.   ASSESSMENT & PLAN:  Cancer of hilus of left lung ([place]) # Adenocarcinoma the lung; stage IV;  CT JULY 2023- CT No evidence of recurrent or metastatic carcinoma within the chest, abdomen, or pelvis. STABLE  # proceed with Alimta chemotherapy. Labs today reviewed;  acceptable for treatment today.   # Thoracic aneurysm-approximately 4.5 cm in size; on surveillance; CT JULY  2023-4.7 cm.  Previously followed by Dr. Faith Rogue.  Currently none.  Awaiting referral with Dr. Lucky Cowboy.  # Chronic fluid retention-as needed Lasix Aldactone.  No worsening of renal function.- STABLE.   # port malfunction: fibrin sheath [2022] s/p TPA-; dysfunction noted- OK today. STABLE.  b12 -9/8- q9W;   # DISPOSITION:   # proceed with Alimta today; B12 injection   # Follow up on OCT 2nd- MD-labs- cbc/cmp; Alimta-Dr.B    Orders Placed This Encounter  Procedures   CBC with Differential    Standing Status:   Future    Standing Expiration Date:   09/04/2023   Comprehensive metabolic panel    Standing Status:   Future    Standing Expiration Date:   09/04/2023     All questions were answered. The patient knows to call the clinic with any problems, questions or concerns.      [name], MD 08/13/2022 9:14 PM

## 2022-08-10 NOTE — Progress Notes (Unsigned)
No concerns. 

## 2022-08-13 ENCOUNTER — Encounter: Payer: Self-pay | Admitting: Internal Medicine

## 2022-08-15 ENCOUNTER — Ambulatory Visit: Payer: Self-pay | Admitting: *Deleted

## 2022-08-15 NOTE — Telephone Encounter (Signed)
Pt  called for  Summary: stomach discomfort   The patient is currently undergoing chemotherapy   The patient experienced slight stomach discomfort and tiredness on Monday 08/13/22   The patient would like to speak with a member of clinical staff when possible        Chief Complaint: abd pain Symptoms: diarrhea Frequency: 2 days but now better Pertinent Negatives: Patient denies fever, pain Disposition: [] ED /[] Urgent Care (no appt availability in office) / [] Appointment(In office/virtual)/ []  [facility]/ [] Home Care/ [] Refused Recommended Disposition /[] [facility]/ [x]  Follow-up with PCP Additional Notes: Pt just wanted these symptoms on his chart in case he feels like that again there will be some documentation of this episode that he is now over.  Answer Assessment - Initial Assessment Questions 1. LOCATION: "Where does it hurt?"      Diarrhea, some abd pain 2. RADIATION: "Does the pain shoot anywhere else?" (e.g., chest, back)     no 3. ONSET: "When did the pain begin?" (Minutes, hours or days ago)      Two days, better 4. SUDDEN: "Gradual or sudden onset?"     gradual 5. PATTERN "Does the pain come and go, or is it constant?"    - If it comes and goes: "How long does it last?" "Do you have pain now?"     (Note: Comes and goes means the pain is intermittent. It goes away completely between bouts.)    - If constant: "Is it getting better, staying the same, or getting worse?"      (Note: Constant means the pain never goes away completely; most serious pain is constant and gets worse.)      Getting better 6. SEVERITY: "How bad is the pain?"  (e.g., Scale 1-10; mild, moderate, or severe)    - MILD (1-3): Doesn't interfere with normal activities, abdomen soft and not tender to touch.     - MODERATE (4-7): Interferes with normal activities or awakens from sleep, abdomen tender to touch.     - SEVERE (8-10): Excruciating pain, doubled over, unable to do any  normal activities.       mild  Protocols used: Abdominal Pain - Male-A-AH

## 2022-09-03 ENCOUNTER — Inpatient Hospital Stay: Payer: BC Managed Care – PPO | Attending: Internal Medicine | Admitting: Internal Medicine

## 2022-09-03 ENCOUNTER — Inpatient Hospital Stay: Payer: BC Managed Care – PPO

## 2022-09-03 ENCOUNTER — Encounter: Payer: Self-pay | Admitting: Internal Medicine

## 2022-09-03 VITALS — BP 147/77 | HR 98 | Temp 98.5°F | Resp 16 | Ht 70.0 in | Wt 212.8 lb

## 2022-09-03 DIAGNOSIS — Z5111 Encounter for antineoplastic chemotherapy: Secondary | ICD-10-CM | POA: Insufficient documentation

## 2022-09-03 DIAGNOSIS — E538 Deficiency of other specified B group vitamins: Secondary | ICD-10-CM | POA: Insufficient documentation

## 2022-09-03 DIAGNOSIS — C3402 Malignant neoplasm of left main bronchus: Secondary | ICD-10-CM | POA: Insufficient documentation

## 2022-09-03 DIAGNOSIS — M7989 Other specified soft tissue disorders: Secondary | ICD-10-CM | POA: Diagnosis not present

## 2022-09-03 DIAGNOSIS — R609 Edema, unspecified: Secondary | ICD-10-CM | POA: Diagnosis not present

## 2022-09-03 DIAGNOSIS — I712 Thoracic aortic aneurysm, without rupture, unspecified: Secondary | ICD-10-CM | POA: Insufficient documentation

## 2022-09-03 DIAGNOSIS — Z79899 Other long term (current) drug therapy: Secondary | ICD-10-CM | POA: Insufficient documentation

## 2022-09-03 LAB — COMPREHENSIVE METABOLIC PANEL
ALT: 20 U/L (ref 0–44)
AST: 30 U/L (ref 15–41)
Albumin: 4.1 g/dL (ref 3.5–5.0)
Alkaline Phosphatase: 49 U/L (ref 38–126)
Anion gap: 8 (ref 5–15)
BUN: 20 mg/dL (ref 6–20)
CO2: 25 mmol/L (ref 22–32)
Calcium: 9.2 mg/dL (ref 8.9–10.3)
Chloride: 104 mmol/L (ref 98–111)
Creatinine, Ser: 1.14 mg/dL (ref 0.61–1.24)
GFR, Estimated: >60 mL/min (ref >60)
Glucose, Bld: 175 mg/dL — ABNORMAL HIGH (ref 70–99)
Potassium: 4.2 mmol/L (ref 3.5–5.1)
Sodium: 137 mmol/L (ref 135–145)
Total Bilirubin: 0.7 mg/dL (ref 0.3–1.2)
Total Protein: 7.7 g/dL (ref 6.5–8.1)

## 2022-09-03 LAB — CBC WITH DIFFERENTIAL/PLATELET
Abs Immature Granulocytes: 0.03 10*3/uL (ref 0.00–0.07)
Basophils Absolute: 0 10*3/uL (ref 0.0–0.1)
Basophils Relative: 1 %
Eosinophils Absolute: 0.1 10*3/uL (ref 0.0–0.5)
Eosinophils Relative: 2 %
HCT: 42.8 % (ref 39.0–52.0)
Hemoglobin: 15 g/dL (ref 13.0–17.0)
Immature Granulocytes: 1 %
Lymphocytes Relative: 30 %
Lymphs Abs: 1.8 10*3/uL (ref 0.7–4.0)
MCH: 32.4 pg (ref 26.0–34.0)
MCHC: 35 g/dL (ref 30.0–36.0)
MCV: 92.4 fL (ref 80.0–100.0)
Monocytes Absolute: 0.6 10*3/uL (ref 0.1–1.0)
Monocytes Relative: 10 %
Neutro Abs: 3.3 10*3/uL (ref 1.7–7.7)
Neutrophils Relative %: 56 %
Platelets: 178 10*3/uL (ref 150–400)
RBC: 4.63 MIL/uL (ref 4.22–5.81)
RDW: 13 % (ref 11.5–15.5)
WBC: 5.8 10*3/uL (ref 4.0–10.5)
nRBC: 0 % (ref 0.0–0.2)

## 2022-09-03 MED ORDER — SODIUM CHLORIDE 0.9 % IV SOLN: 6.0000 mg | Freq: Once | INTRAVENOUS | Status: DC

## 2022-09-03 MED ORDER — HEPARIN SOD (PORK) LOCK FLUSH 100 UNIT/ML IV SOLN
500.0000 [IU] | Freq: Once | INTRAVENOUS | Status: AC | PRN
  Administered 2022-09-03: 500 [IU]
  Filled 2022-09-03: qty 5

## 2022-09-03 MED ORDER — DEXAMETHASONE SODIUM PHOSPHATE 10 MG/ML IJ SOLN
6.0000 mg | Freq: Once | INTRAMUSCULAR | Status: AC
  Administered 2022-09-03: 6 mg via INTRAVENOUS
  Filled 2022-09-03: qty 1

## 2022-09-03 MED ORDER — PROCHLORPERAZINE MALEATE 10 MG PO TABS
10.0000 mg | ORAL_TABLET | Freq: Once | ORAL | Status: AC
  Administered 2022-09-03: 10 mg via ORAL

## 2022-09-03 MED ORDER — SODIUM CHLORIDE 0.9 % IV SOLN
Freq: Once | INTRAVENOUS | Status: AC
  Filled 2022-09-03: qty 250

## 2022-09-03 MED ORDER — SODIUM CHLORIDE 0.9 % IV SOLN
500.0000 mg/m2 | Freq: Once | INTRAVENOUS | Status: AC
  Administered 2022-09-03: 1100 mg via INTRAVENOUS
  Filled 2022-09-03: qty 4

## 2022-09-03 NOTE — Progress Notes (Signed)
[place] OFFICE PROGRESS NOTE  Patient Care Team: [name], MD as PCP - General (Family Medicine) [name], MD as Consulting Physician (Internal Medicine)   Cancer Staging  No matching staging information was found for the patient.   Oncology History Overview Note  # 2012- METASTATIC ADENO CA of LEFT LUNG [acinar pattern] s/p MED LN Bx [NEG- EGFR/K-ras/ALK mutation];[name]; [name]; CT JULY  2016- NED; JAN 2018- NED;   # AUG 2020-clinical trial closed; continue maintenance Alimta  # Thoracic aortic aneurysm-4.0 cm in 2016; currently 4.6 cm.  s/p referral to Dr. Faith Rogue.; MRI in 6 months planned.  ----------------------------------------------------------    DIAGNOSIS: Mitchell Mosley ] Adenocarcinoma lung  STAGE: 4       ;GOALS: Palliative  CURRENT/MOST RECENT THERAPY '[ ]'  Alimta maintenance    Cancer of hilus of left lung ([place])  07/20/2016 Initial Diagnosis   Cancer of hilus of left lung ([place])   12/11/2019 - 06/29/2022 Chemotherapy   Patient is on Treatment Plan : LUNG Pemetrexed (Alimta) q21d      12/11/2019 -  Chemotherapy   Patient is on Treatment Plan : LUNG Pemetrexed (500) q21d       INTERVAL HISTORY: Alone.  Ambulating independently.  Mitchell Mosley 61 y.o.  male pleasant patient above history of metastatic adenocarcinoma the lung-NED currently on maintenance Alimta is here for follow-up.   Patient denies any symptoms.  No new shortness of breath or cough he denies any worsening joint pains. Currently  on Lasix/Aldactone for chronic lower extremity swelling.  Review of Systems  Constitutional:  Negative for chills, diaphoresis, fever, malaise/fatigue and weight loss.  HENT:  Negative for nosebleeds and sore throat.   Eyes:  Negative for double vision.  Respiratory:  Negative for hemoptysis, shortness of breath and wheezing.    Cardiovascular:  Positive for leg swelling. Negative for chest pain, palpitations and orthopnea.  Gastrointestinal:  Negative for abdominal pain, blood in stool, constipation, diarrhea, heartburn, melena, nausea and vomiting.  Genitourinary:  Negative for dysuria, frequency and urgency.  Musculoskeletal:  Positive for joint pain.  Skin: Negative.  Negative for itching and rash.  Neurological:  Negative for dizziness, tingling, focal weakness, weakness and headaches.  Endo/Heme/Allergies:  Does not bruise/bleed easily.  Psychiatric/Behavioral:  Negative for depression. The patient is not nervous/anxious and does not have insomnia.       PAST MEDICAL HISTORY :  Past Medical History:  Diagnosis Date  . Allergy   . Lung cancer ([place])   . Mild valvular heart disease Nov. 2014   per 2 D Echocardiogram done for persistent leg edema, monitored by cardiology  . Peripheral edema   . Thoracic aortic aneurysm (TAA) ([place])     PAST SURGICAL HISTORY :   Past Surgical History:  Procedure Laterality Date  . ELBOW SURGERY Right 2011  . IR CV LINE INJECTION  09/11/2021  . Left anterior thoracotomy with biopsy  March 2012  . PORTACATH PLACEMENT  March 2012  . TONSILLECTOMY  as child    FAMILY HISTORY :   Family History  Problem Relation Age of Onset  . Leukemia Father   . Diabetes Mother     SOCIAL HISTORY:   Social History   Tobacco Use  . Smoking status: Never  . Smokeless tobacco: Never  Vaping Use  . Vaping Use: Never used  Substance Use Topics  . Alcohol use: Yes  Alcohol/week: 0.0 standard drinks of alcohol    Comment: "social drinker"   . Drug use: No    ALLERGIES:  is allergic to penicillins.  MEDICATIONS:  Current Outpatient Medications  Medication Sig Dispense Refill  . fluticasone (FLONASE) 50 MCG/ACT nasal spray Place 2 sprays into both nostrils daily. 16 g 6  . folic acid (FOLVITE) 1 MG tablet TAKE 1 TABLET BY MOUTH EVERY DAY 90 tablet 3  . furosemide (LASIX)  20 MG tablet TAKE 1 TABLET (20 MG TOTAL) BY MOUTH DAILY AS NEEDED FOR SWELLING 90 tablet 1  . Multiple Vitamin (MULTIVITAMIN) capsule Take 1 capsule by mouth daily. Reported on 02/24/2016    . NON FORMULARY Relief Factor with vitamin D and Turmeric    . Omega-3 Fatty Acids (FISH OIL) 1000 MG CAPS Take 1 capsule by mouth daily. Reported on 02/24/2016    . senna (SENOKOT) 8.6 MG tablet Take 1 tablet by mouth daily. Take as needed around time of chemotherapy    . sildenafil (VIAGRA) 100 MG tablet TAKE 1 TABLET BY MOUTH EVERY DAY AS NEEDED FOR ERECTILE DYSFUNCTION 15 tablet 11  . spironolactone (ALDACTONE) 25 MG tablet TAKE 1 TABLET BY MOUTH EVERY DAY 90 tablet 3  . tadalafil (CIALIS) 20 MG tablet Take one tablet every 2 days as needed 50 tablet 5  . vitamin B-12 (CYANOCOBALAMIN) 1000 MCG tablet Take 1,000 mcg by mouth daily.    Marland Kitchen HYDROcodone-acetaminophen (NORCO) 5-325 MG tablet Take 1 tablet by mouth every 6 (six) hours as needed for moderate pain. (Patient not taking: Reported on 03/16/2022) 30 tablet 0  . meloxicam (MOBIC) 15 MG tablet Take 15 mg by mouth daily. (Patient not taking: Reported on 03/16/2022)     No current facility-administered medications for this visit.   Facility-Administered Medications Ordered in Other Visits  Medication Dose Route Frequency Provider Last Rate Last Admin  . heparin lock flush 100 unit/mL  500 Units Intravenous Once Charlaine Dalton R, [name], MD   10 mL at 05/27/15 1543  . sodium chloride 0.9 % injection 10 mL  10 mL Intracatheter PRN [name], MD   10 mL at 06/17/15 1510  . sodium chloride 0.9 % injection 10 mL  10 mL Intravenous PRN Choksi, Delorise Shiner, MD   10 mL at 07/08/15 1340  . sodium chloride flush (NS) 0.9 % injection 10 mL  10 mL Intravenous PRN Verlon Au, NP   10 mL at 09/30/20 1412    PHYSICAL EXAMINATION: ECOG PERFORMANCE STATUS: 0 - Asymptomatic  BP (!)  147/77 (BP Location: Left Arm, Patient Position: Sitting)   Pulse 98   Temp 98.5 F (36.9 C) (Tympanic)   Resp 16   Ht '5\' 10"'  (1.778 m)   Wt 212 lb 12.8 oz (96.5 kg)   BMI 30.53 kg/m   Filed Weights   09/03/22 1000  Weight: 212 lb 12.8 oz (96.5 kg)     Physical Exam HENT:     Head: Normocephalic and atraumatic.     Mouth/Throat:     Pharynx: No oropharyngeal exudate.  Eyes:     Pupils: Pupils are equal, round, and reactive to light.  Cardiovascular:     Rate and Rhythm: Normal rate and regular rhythm.  Pulmonary:     Effort: No respiratory distress.     Breath sounds: No wheezing.  Abdominal:     General: Bowel  sounds are normal. There is no distension.     Palpations: Abdomen is soft. There is no mass.     Tenderness: There is no abdominal tenderness. There is no guarding or rebound.  Musculoskeletal:        General: No tenderness. Normal range of motion.     Cervical back: Normal range of motion and neck supple.     Comments: Grade 1 swelling in the legs bilaterally.  Skin:    General: Skin is warm.  Neurological:     Mental Status: He is alert and oriented to person, place, and time.  Psychiatric:        Mood and Affect: Affect normal.     LABORATORY DATA:  I have reviewed the data as listed    Component Value Date/Time   NA 137 09/03/2022 0937   NA 137 06/19/2021 1121   NA 138 12/10/2014 1341   K 4.2 09/03/2022 0937   K 3.6 12/10/2014 1341   CL 104 09/03/2022 0937   CL 102 12/10/2014 1341   CO2 25 09/03/2022 0937   CO2 30 12/10/2014 1341   GLUCOSE 175 (H) 09/03/2022 0937   GLUCOSE 160 (H) 12/10/2014 1341   BUN 20 09/03/2022 0937   BUN 18 06/19/2021 1121   BUN 21 (H) 12/10/2014 1341   CREATININE 1.14 09/03/2022 0937   CREATININE 1.10 03/25/2015 1453   CALCIUM 9.2 09/03/2022 0937   CALCIUM 8.4 (L) 12/10/2014 1341   PROT 7.7 09/03/2022 0937   PROT 7.3 06/19/2021 1121   PROT 6.6 12/10/2014 1341   ALBUMIN 4.1 09/03/2022 0937   ALBUMIN 4.5  06/19/2021 1121   ALBUMIN 3.4 12/10/2014 1341   AST 30 09/03/2022 0937   AST 17 12/10/2014 1341   ALT 20 09/03/2022 0937   ALT 23 12/10/2014 1341   ALKPHOS 49 09/03/2022 0937   ALKPHOS 50 12/10/2014 1341   BILITOT 0.7 09/03/2022 0937   BILITOT 0.2 06/19/2021 1121   BILITOT 0.5 12/10/2014 1341   GFRNONAA >60 09/03/2022 0937   GFRNONAA >60 03/25/2015 1453   GFRAA >60 08/19/2020 1324   GFRAA >60 03/25/2015 1453    No results found for: "SPEP", "UPEP"  Lab Results  Component Value Date   WBC 5.8 09/03/2022   NEUTROABS 3.3 09/03/2022   HGB 15.0 09/03/2022   HCT 42.8 09/03/2022   MCV 92.4 09/03/2022   PLT 178 09/03/2022      Chemistry      Component Value Date/Time   NA 137 09/03/2022 0937   NA 137 06/19/2021 1121   NA 138 12/10/2014 1341   K 4.2 09/03/2022 0937   K 3.6 12/10/2014 1341   CL 104 09/03/2022 0937   CL 102 12/10/2014 1341   CO2 25 09/03/2022 0937   CO2 30 12/10/2014 1341   BUN 20 09/03/2022 0937   BUN 18 06/19/2021 1121   BUN 21 (H) 12/10/2014 1341   CREATININE 1.14 09/03/2022 0937   CREATININE 1.10 03/25/2015 1453      Component Value Date/Time   CALCIUM 9.2 09/03/2022 0937   CALCIUM 8.4 (L) 12/10/2014 1341   ALKPHOS 49 09/03/2022 0937   ALKPHOS 50 12/10/2014 1341   AST 30 09/03/2022 0937   AST 17 12/10/2014 1341   ALT 20 09/03/2022 0937   ALT 23 12/10/2014 1341   BILITOT 0.7 09/03/2022 0937   BILITOT 0.2 06/19/2021 1121   BILITOT 0.5 12/10/2014 1341       RADIOGRAPHIC STUDIES: I have personally reviewed the radiological images as  listed and agreed with the findings in the report. No results found.   ASSESSMENT & PLAN:  Cancer of hilus of left lung ([place]) # Adenocarcinoma the lung; stage IV;  CT JULY 2023- CT No evidence of recurrent or metastatic carcinoma within the chest, abdomen, or pelvis. STABLE  # proceed with Alimta chemotherapy. Labs today reviewed;  acceptable for treatment today.   # Thoracic aneurysm-approximately 4.5 cm in  size; on surveillance; CT JULY 2023-4.7 cm.  Awaiting evaluation with 10/17- Dr. Lucky Cowboy.  # Chronic fluid retention-as needed Lasix Aldactone.  No worsening of renal function.- STABLE.   # port malfunction: fibrin sheath [2022] s/p TPA-; dysfunction noted- OK today. STABLE.   b12 -9/8- q9W;   # DISPOSITION:   # proceed with Alimta today;   # Follow up in 20th- /Friday MD-labs- cbc/cmp; Alimta-Dr.B    Orders Placed This Encounter  Procedures  . CBC with Differential    Standing Status:   Future    Standing Expiration Date:   09/22/2023  . Comprehensive metabolic panel    Standing Status:   Future    Standing Expiration Date:   09/22/2023     All questions were answered. The patient knows to call the clinic with any problems, questions or concerns.      [name], MD 09/03/2022 10:18 AM

## 2022-09-03 NOTE — Assessment & Plan Note (Addendum)
#   Adenocarcinoma the lung; stage IV;  CT JULY 2023- CT No evidence of recurrent or metastatic carcinoma within the chest, abdomen, or pelvis. STABLE  # proceed with Alimta chemotherapy. Labs today reviewed;  acceptable for treatment today.   # Thoracic aneurysm-approximately 4.5 cm in size; on surveillance; CT JULY 2023-4.7 cm.  Awaiting evaluation with 10/17- Dr. Lucky Cowboy.  # Chronic fluid retention-as needed Lasix Aldactone.  No worsening of renal function.- STABLE.   # port malfunction: fibrin sheath [2022] s/p TPA-; dysfunction noted- OK today. STABLE.   b12 -9/8- q9W;   # DISPOSITION:   # proceed with Alimta today;   # Follow up in 20th- /Friday MD-labs- cbc/cmp; Alimta-Dr.B

## 2022-09-03 NOTE — Progress Notes (Signed)
Patient denies new problems/concerns today.   °

## 2022-09-04 ENCOUNTER — Other Ambulatory Visit: Payer: Self-pay

## 2022-09-05 ENCOUNTER — Other Ambulatory Visit: Payer: Self-pay

## 2022-09-17 ENCOUNTER — Other Ambulatory Visit: Payer: Self-pay

## 2022-09-18 ENCOUNTER — Encounter (INDEPENDENT_AMBULATORY_CARE_PROVIDER_SITE_OTHER): Payer: Self-pay | Admitting: Vascular Surgery

## 2022-09-18 ENCOUNTER — Ambulatory Visit (INDEPENDENT_AMBULATORY_CARE_PROVIDER_SITE_OTHER): Payer: BC Managed Care – PPO | Admitting: Vascular Surgery

## 2022-09-18 VITALS — BP 147/93 | HR 90 | Resp 16 | Wt 214.2 lb

## 2022-09-18 DIAGNOSIS — C3402 Malignant neoplasm of left main bronchus: Secondary | ICD-10-CM

## 2022-09-18 DIAGNOSIS — I351 Nonrheumatic aortic (valve) insufficiency: Secondary | ICD-10-CM

## 2022-09-18 DIAGNOSIS — I712 Thoracic aortic aneurysm, without rupture, unspecified: Secondary | ICD-10-CM | POA: Diagnosis not present

## 2022-09-18 NOTE — Progress Notes (Signed)
Patient ID: Mitchell Mosley, male   DOB: 1961/07/16, 61 y.o.   MRN: [id number]  Chief Complaint  Patient presents with   New Patient (Initial Visit)    Ref Cleda Mccreedy 4.7cm Thoracic aortic aneurysm up from 4.5cm 08/2021    HPI Mitchell Mosley is a 61 y.o. male.  I am asked to see the patient by Dr. Rogue Bussing for evaluation of his thoracic aortic aneurysm.  The patient has had a known ascending thoracic aortic aneurysm that was previously followed by cardiothoracic surgeon in the community who has recently retired.  This was seen on CT scans of the chest that he has been getting for lung cancer.  He follows with medical oncology at this point for this.  Currently doing well.  No aneurysm related symptoms. Specifically, the patient denies new back or abdominal pain, or signs of peripheral embolization.  I have independently reviewed his recent CT scan which demonstrates a 4.7 cm ascending thoracic aortic aneurysm without complicating features.   Past Medical History:  Diagnosis Date   Allergy    Lung cancer ([place])    Mild valvular heart disease Nov. 2014   per 2 D Echocardiogram done for persistent leg edema, monitored by cardiology   Peripheral edema    Thoracic aortic aneurysm (TAA) [facility])     Past Surgical History:  Procedure Laterality Date   ELBOW SURGERY Right 2011   IR CV LINE INJECTION  09/11/2021   Left anterior thoracotomy with biopsy  March 2012   PORTACATH PLACEMENT  March 2012   TONSILLECTOMY  as child     Family History  Problem Relation Age of Onset   Leukemia Father    Diabetes Mother   No bleeding or clotting disorders   Social History   Tobacco Use   Smoking status: Never   Smokeless tobacco: Never  Vaping Use   Vaping Use: Never used  Substance Use Topics   Alcohol use: Yes    Alcohol/week: 0.0 standard drinks of alcohol    Comment: "social drinker"    Drug use: No     Allergies  Allergen Reactions   Penicillins     Reaction and  severity unknown    Current Outpatient Medications  Medication Sig Dispense Refill   fluticasone (FLONASE) 50 MCG/ACT nasal spray Place 2 sprays into both nostrils daily. 16 g 6   folic acid (FOLVITE) 1 MG tablet TAKE 1 TABLET BY MOUTH EVERY DAY 90 tablet 3   furosemide (LASIX) 20 MG tablet TAKE 1 TABLET (20 MG TOTAL) BY MOUTH DAILY AS NEEDED FOR SWELLING 90 tablet 1   Multiple Vitamin (MULTIVITAMIN) capsule Take 1 capsule by mouth daily. Reported on 02/24/2016     NON FORMULARY Relief Factor with vitamin D and Turmeric     Omega-3 Fatty Acids (FISH OIL) 1000 MG CAPS Take 1 capsule by mouth daily. Reported on 02/24/2016     senna (SENOKOT) 8.6 MG tablet Take 1 tablet by mouth daily. Take as needed around time of chemotherapy     sildenafil (VIAGRA) 100 MG tablet TAKE 1 TABLET BY MOUTH EVERY DAY AS NEEDED FOR ERECTILE DYSFUNCTION 15 tablet 11   spironolactone (ALDACTONE) 25 MG tablet TAKE 1 TABLET BY MOUTH EVERY DAY 90 tablet 3   tadalafil (CIALIS) 20 MG tablet Take one tablet every 2 days as needed 50 tablet 5   vitamin B-12 (CYANOCOBALAMIN) 1000 MCG tablet Take 1,000 mcg by mouth daily.     HYDROcodone-acetaminophen (NORCO) 5-325 MG  tablet Take 1 tablet by mouth every 6 (six) hours as needed for moderate pain. (Patient not taking: Reported on 03/16/2022) 30 tablet 0   meloxicam (MOBIC) 15 MG tablet Take 15 mg by mouth daily. (Patient not taking: Reported on 03/16/2022)     No current facility-administered medications for this visit.   Facility-Administered Medications Ordered in Other Visits  Medication Dose Route Frequency Provider Last Rate Last Admin   heparin lock flush 100 unit/mL  500 Units Intravenous Once Charlaine Dalton R, MD       sodium chloride 0.9 % injection 10 mL  10 mL Intracatheter PRN [name], MD   10 mL at 05/27/15 1543   sodium chloride 0.9 % injection 10 mL  10 mL Intracatheter PRN [name], MD   10 mL at 06/17/15 1510   sodium chloride 0.9 % injection  10 mL  10 mL Intravenous PRN [name], MD   10 mL at 07/08/15 1340   sodium chloride flush (NS) 0.9 % injection 10 mL  10 mL Intravenous PRN Verlon Au, NP   10 mL at 09/30/20 1412      REVIEW OF SYSTEMS (Negative unless checked)  Constitutional: [] Weight loss  [] Fever  [] Chills Cardiac: [] Chest pain   [] Chest pressure   [] Palpitations   [] Shortness of breath when laying flat   [] Shortness of breath at rest   [] Shortness of breath with exertion. Vascular:  [] Pain in legs with walking   [] Pain in legs at rest   [] Pain in legs when laying flat   [] Claudication   [] Pain in feet when walking  [] Pain in feet at rest  [] Pain in feet when laying flat   [] History of DVT   [] Phlebitis   [x] Swelling in legs   [] Varicose veins   [] Non-healing ulcers Pulmonary:   [] Uses home oxygen   [] Productive cough   [] Hemoptysis   [] Wheeze  [] COPD   [] Asthma Neurologic:  [] Dizziness  [] Blackouts   [] Seizures   [] History of stroke   [] History of TIA  [] Aphasia   [] Temporary blindness   [] Dysphagia   [] Weakness or numbness in arms   [] Weakness or numbness in legs Musculoskeletal:  [] Arthritis   [] Joint swelling   [] Joint pain   [] Low back pain Hematologic:  [] Easy bruising  [] Easy bleeding   [] Hypercoagulable state   [] Anemic  [] Hepatitis Gastrointestinal:  [] Blood in stool   [] Vomiting blood  [] Gastroesophageal reflux/heartburn   [] Abdominal pain Genitourinary:  [] Chronic kidney disease   [] Difficult urination  [] Frequent urination  [] Burning with urination   [] Hematuria Skin:  [] Rashes   [] Ulcers   [] Wounds Psychological:  [] History of anxiety   []  History of major depression.    Physical Exam BP (!) 147/93 (BP Location: Right Arm)   Pulse 90   Resp 16   Wt 214 lb 3.2 oz (97.2 kg)   BMI 30.73 kg/m  Gen:  WD/WN, NAD Head: [place]/AT, No temporalis wasting.  Ear/Nose/Throat: Hearing grossly intact, nares w/o erythema or drainage, oropharynx w/o Erythema/Exudate Eyes: Conjunctiva clear, sclera non-icteric   Neck: trachea midline.  No JVD.  Pulmonary:  Good air movement, respirations not labored, no use of accessory muscles  Cardiac: RRR, no JVD Vascular:  Vessel Right Left  Radial Palpable Palpable                                   Gastrointestinal:. No masses, surgical incisions, or scars. Musculoskeletal: M/S  5/5 throughout.  Extremities without ischemic changes.  No deformity or atrophy. Mild LE edema. Neurologic: Sensation grossly intact in extremities.  Symmetrical.  Speech is fluent. Motor exam as listed above. Psychiatric: Judgment intact, Mood & affect appropriate for pt's clinical situation. Dermatologic: No rashes or ulcers noted.  No cellulitis or open wounds.    Radiology No results found.  Labs Recent Results (from the past 2160 hour(s))  Comprehensive metabolic panel     Status: Abnormal   Collection Time: 06/29/22  1:54 PM  Result Value Ref Range   Sodium 138 135 - 145 mmol/L   Potassium 3.8 3.5 - 5.1 mmol/L   Chloride 104 98 - 111 mmol/L   CO2 27 22 - 32 mmol/L   Glucose, Bld 175 (H) 70 - 99 mg/dL    Comment: Glucose reference range applies only to samples taken after fasting for at least 8 hours.   BUN 17 6 - 20 mg/dL   Creatinine, Ser 1.11 0.61 - 1.24 mg/dL   Calcium 8.9 8.9 - 10.3 mg/dL   Total Protein 7.0 6.5 - 8.1 g/dL   Albumin 3.9 3.5 - 5.0 g/dL   AST 27 15 - 41 U/L   ALT 15 0 - 44 U/L   Alkaline Phosphatase 43 38 - 126 U/L   Total Bilirubin 0.6 0.3 - 1.2 mg/dL   GFR, Estimated >60 >60 mL/min    Comment: (NOTE) Calculated using the CKD-EPI Creatinine Equation (2021)    Anion gap 7 5 - 15    Comment: Performed at [facility], [address] [place], [place] [address]  CBC with Differential     Status: None   Collection Time: 06/29/22  1:54 PM  Result Value Ref Range   WBC 5.3 4.0 - 10.5 K/uL   RBC 4.22 4.22 - 5.81 MIL/uL   Hemoglobin 13.7 13.0 - 17.0 g/dL   HCT 39.7 39.0 - 52.0 %   MCV 94.1 80.0 - 100.0 fL   MCH 32.5 26.0 -  34.0 pg   MCHC 34.5 30.0 - 36.0 g/dL   RDW 13.2 11.5 - 15.5 %   Platelets 158 150 - 400 K/uL   nRBC 0.0 0.0 - 0.2 %   Neutrophils Relative % 50 %   Neutro Abs 2.6 1.7 - 7.7 K/uL   Lymphocytes Relative 35 %   Lymphs Abs 1.8 0.7 - 4.0 K/uL   Monocytes Relative 11 %   Monocytes Absolute 0.6 0.1 - 1.0 K/uL   Eosinophils Relative 3 %   Eosinophils Absolute 0.2 0.0 - 0.5 K/uL   Basophils Relative 1 %   Basophils Absolute 0.0 0.0 - 0.1 K/uL   Immature Granulocytes 0 %   Abs Immature Granulocytes 0.01 0.00 - 0.07 K/uL    Comment: Performed at [facility], [address] [place], [place] [address]  Comprehensive metabolic panel     Status: Abnormal   Collection Time: 07/20/22 12:54 PM  Result Value Ref Range   Sodium 137 135 - 145 mmol/L   Potassium 3.5 3.5 - 5.1 mmol/L   Chloride 105 98 - 111 mmol/L   CO2 27 22 - 32 mmol/L   Glucose, Bld 131 (H) 70 - 99 mg/dL    Comment: Glucose reference range applies only to samples taken after fasting for at least 8 hours.   BUN 24 (H) 6 - 20 mg/dL   Creatinine, Ser 1.05 0.61 - 1.24 mg/dL   Calcium 8.7 (L) 8.9 - 10.3 mg/dL   Total  Protein 7.5 6.5 - 8.1 g/dL   Albumin 4.0 3.5 - 5.0 g/dL   AST 25 15 - 41 U/L   ALT 19 0 - 44 U/L   Alkaline Phosphatase 37 (L) 38 - 126 U/L   Total Bilirubin 1.0 0.3 - 1.2 mg/dL   GFR, Estimated >60 >60 mL/min    Comment: (NOTE) Calculated using the CKD-EPI Creatinine Equation (2021)    Anion gap 5 5 - 15    Comment: Performed at [facility], [address] [place], [place] [address]  CBC with Differential/Platelet     Status: Abnormal   Collection Time: 07/20/22 12:54 PM  Result Value Ref Range   WBC 5.1 4.0 - 10.5 K/uL   RBC 4.24 4.22 - 5.81 MIL/uL   Hemoglobin 13.7 13.0 - 17.0 g/dL   HCT 40.5 39.0 - 52.0 %   MCV 95.5 80.0 - 100.0 fL   MCH 32.3 26.0 - 34.0 pg   MCHC 33.8 30.0 - 36.0 g/dL   RDW 13.2 11.5 - 15.5 %   Platelets 145 (L) 150 - 400 K/uL   nRBC 0.0 0.0 - 0.2 %   Neutrophils  Relative % 50 %   Neutro Abs 2.5 1.7 - 7.7 K/uL   Lymphocytes Relative 36 %   Lymphs Abs 1.8 0.7 - 4.0 K/uL   Monocytes Relative 12 %   Monocytes Absolute 0.6 0.1 - 1.0 K/uL   Eosinophils Relative 2 %   Eosinophils Absolute 0.1 0.0 - 0.5 K/uL   Basophils Relative 0 %   Basophils Absolute 0.0 0.0 - 0.1 K/uL   Immature Granulocytes 0 %   Abs Immature Granulocytes 0.02 0.00 - 0.07 K/uL    Comment: Performed at [facility], [address] [place], [place] [address]  Comprehensive metabolic panel     Status: Abnormal   Collection Time: 08/10/22  2:25 PM  Result Value Ref Range   Sodium 136 135 - 145 mmol/L   Potassium 3.4 (L) 3.5 - 5.1 mmol/L   Chloride 104 98 - 111 mmol/L   CO2 25 22 - 32 mmol/L   Glucose, Bld 186 (H) 70 - 99 mg/dL    Comment: Glucose reference range applies only to samples taken after fasting for at least 8 hours.   BUN 21 (H) 6 - 20 mg/dL   Creatinine, Ser 1.06 0.61 - 1.24 mg/dL   Calcium 9.0 8.9 - 10.3 mg/dL   Total Protein 7.2 6.5 - 8.1 g/dL   Albumin 3.9 3.5 - 5.0 g/dL   AST 27 15 - 41 U/L   ALT 15 0 - 44 U/L   Alkaline Phosphatase 47 38 - 126 U/L   Total Bilirubin 0.5 0.3 - 1.2 mg/dL   GFR, Estimated >60 >60 mL/min    Comment: (NOTE) Calculated using the CKD-EPI Creatinine Equation (2021)    Anion gap 7 5 - 15    Comment: Performed at [facility], [address] [place], [place] [address]  CBC with Differential     Status: None   Collection Time: 08/10/22  2:25 PM  Result Value Ref Range   WBC 6.1 4.0 - 10.5 K/uL   RBC 4.28 4.22 - 5.81 MIL/uL   Hemoglobin 14.0 13.0 - 17.0 g/dL   HCT 41.0 39.0 - 52.0 %   MCV 95.8 80.0 - 100.0 fL   MCH 32.7 26.0 - 34.0 pg   MCHC 34.1 30.0 - 36.0 g/dL   RDW 13.2 11.5 - 15.5 %  Platelets 181 150 - 400 K/uL   nRBC 0.0 0.0 - 0.2 %   Neutrophils Relative % 61 %   Neutro Abs 3.7 1.7 - 7.7 K/uL   Lymphocytes Relative 26 %   Lymphs Abs 1.6 0.7 - 4.0 K/uL   Monocytes Relative 9 %   Monocytes Absolute  0.6 0.1 - 1.0 K/uL   Eosinophils Relative 3 %   Eosinophils Absolute 0.2 0.0 - 0.5 K/uL   Basophils Relative 1 %   Basophils Absolute 0.1 0.0 - 0.1 K/uL   Immature Granulocytes 0 %   Abs Immature Granulocytes 0.02 0.00 - 0.07 K/uL    Comment: Performed at [facility], [address] [place], [place] [address]  Comprehensive metabolic panel     Status: Abnormal   Collection Time: 09/03/22  9:37 AM  Result Value Ref Range   Sodium 137 135 - 145 mmol/L   Potassium 4.2 3.5 - 5.1 mmol/L   Chloride 104 98 - 111 mmol/L   CO2 25 22 - 32 mmol/L   Glucose, Bld 175 (H) 70 - 99 mg/dL    Comment: Glucose reference range applies only to samples taken after fasting for at least 8 hours.   BUN 20 6 - 20 mg/dL   Creatinine, Ser 1.14 0.61 - 1.24 mg/dL   Calcium 9.2 8.9 - 10.3 mg/dL   Total Protein 7.7 6.5 - 8.1 g/dL   Albumin 4.1 3.5 - 5.0 g/dL   AST 30 15 - 41 U/L   ALT 20 0 - 44 U/L   Alkaline Phosphatase 49 38 - 126 U/L   Total Bilirubin 0.7 0.3 - 1.2 mg/dL   GFR, Estimated >60 >60 mL/min    Comment: (NOTE) Calculated using the CKD-EPI Creatinine Equation (2021)    Anion gap 8 5 - 15    Comment: Performed at [facility], [address] [place], [place] [address]  CBC with Differential     Status: None   Collection Time: 09/03/22  9:37 AM  Result Value Ref Range   WBC 5.8 4.0 - 10.5 K/uL   RBC 4.63 4.22 - 5.81 MIL/uL   Hemoglobin 15.0 13.0 - 17.0 g/dL   HCT 42.8 39.0 - 52.0 %   MCV 92.4 80.0 - 100.0 fL   MCH 32.4 26.0 - 34.0 pg   MCHC 35.0 30.0 - 36.0 g/dL   RDW 13.0 11.5 - 15.5 %   Platelets 178 150 - 400 K/uL   nRBC 0.0 0.0 - 0.2 %   Neutrophils Relative % 56 %   Neutro Abs 3.3 1.7 - 7.7 K/uL   Lymphocytes Relative 30 %   Lymphs Abs 1.8 0.7 - 4.0 K/uL   Monocytes Relative 10 %   Monocytes Absolute 0.6 0.1 - 1.0 K/uL   Eosinophils Relative 2 %   Eosinophils Absolute 0.1 0.0 - 0.5 K/uL   Basophils Relative 1 %   Basophils Absolute 0.0 0.0 - 0.1 K/uL   Immature  Granulocytes 1 %   Abs Immature Granulocytes 0.03 0.00 - 0.07 K/uL    Comment: Performed at [facility], [address] [place], [place] [address]    Assessment/Plan:  Cancer of hilus of left lung [facility]) Follows with Oncology. Already getting CT scans of the chest twice a year and we can follow his TAA with this as well.  Nonrheumatic aortic valve insufficiency Likely the primary cause of his ascending aortic dilatation.   Thoracic aortic aneurysm (TAA) ([place]) I have independently reviewed his  recent CT scan.  He has an a sending aortic aneurysm that measures approximately 4.7 cm in maximal diameter.  This had previously been followed by a cardiothoracic surgeon who has retired.  We will be happy to follow this.  It does not require any intervention or surgery at this time.  We discussed that if it grows to a size of greater than 6 cm, he would likely have to be referred for evaluation by cardiothoracic surgery for repair.  He is already getting CT scans every 6 months for his lung cancer so we can follow his aneurysm with those scans as well.  I will see him annually.      Leotis Pain 09/18/2022, 10:23 AM   This note was created with Dragon medical transcription system.  Any errors from dictation are unintentional.

## 2022-09-18 NOTE — Patient Instructions (Signed)
Thoracic Aortic Aneurysm  An aneurysm is a bulge in an artery. It happens when blood pushes up against a weakened or damaged artery wall. A thoracic aortic aneurysm is an aneurysm that occurs in the first part of the aorta, between the heart and the diaphragm. The aorta is the main artery of the body. It supplies blood from the heart to the rest of the body. Some aneurysms may not cause symptoms or problems. However, a thoracic aortic aneurysm can cause two serious problems: It can enlarge and burst (rupture). It can cause blood to flow between the layers of the wall of the aorta through a tear (aortic dissection). Both of these problems are medical emergencies. They can cause bleeding inside the body and can be life-threatening if they are not diagnosed and treated right away. What are the causes? The exact cause of this condition is not known. What increases the risk? The following factors may make you more likely to develop this condition: Being 51 years of age or older. Having a family history of aneurysms. Using tobacco. Having any of these conditions: Hardening of the arteries caused by the buildup of fat and other substances in the lining of a blood vessel (arteriosclerosis). Inflammation of the walls of an artery (arteritis). A genetic disease that weakens the body's connective tissue, such as Marfan syndrome. An injury or trauma to the aorta. High blood pressure (hypertension). High cholesterol. An infection from bacteria, such as syphilis or staphylococcus, in the wall of the aorta (infectious aortitis). What are the signs or symptoms? Symptoms of this condition vary depending on the size of the aneurysm and how fast it is growing. Most grow slowly and do not cause symptoms. When symptoms do occur, they may include: Pain in the chest, back, sides, or abdomen. The pain may vary in intensity. Sudden, severe pain may indicate that the aneurysm has  ruptured. Hoarseness. Cough. Shortness of breath. Swallowing problems. Swelling in the face, arms, or legs. Fever. Unexplained weight loss. How is this diagnosed? This condition may be diagnosed with: An ultrasound. X-rays. CT scan. MRI. A test to check the arteries for damage or blockages (angiogram). Most unruptured thoracic aortic aneurysms cause no symptoms, so they are often found during exams for other conditions. How is this treated? Treatment for this condition depends on: The size of the aneurysm. How fast the aneurysm is growing. Your age. Risk factors for rupture. Small aneurysms (2.2 inches, or 5.5 cm, or less) may be managed with: Medicines to: Control blood pressure. Manage pain. Fight infection. Regular monitoring. This may include an ultrasound or CT scan every year or every 6 months to see if the aneurysm is getting bigger. Large or fast-growing aneurysms may be treated with surgery. Follow these instructions at home: Eating and drinking  Eat a healthy diet. Your health care provider may recommend that you: Lower your salt (sodium) intake. In some people, too much salt can raise blood pressure and increase the risk for thoracic aortic aneurysm. Avoid foods that are high in saturated fat and cholesterol, such as red meat and full-fat dairy. Eat a diet that is low in sugar. Increase your fiber intake by including whole grains, vegetables, and fruits in your diet. Eating these foods may help to lower your blood pressure. Do not drink alcohol if your health care provider tells you not to drink. If you drink alcohol: Limit how much you use to: 0-1 drink a day for women who are not pregnant. 0-2 drinks a day  for men. Be aware of how much alcohol is in your drink. In the [place] one drink equals one 12 oz bottle of beer (355 mL), one 5 oz glass of wine (148 mL), or one 1 oz glass of hard liquor (44 mL). Lifestyle Do not use any products that contain nicotine or  tobacco, such as cigarettes, e-cigarettes, and chewing tobacco. If you need help quitting, ask your health care provider. Maintain a healthy weight. Check your blood pressure regularly. Follow your health care provider's instructions on how to keep your blood pressure within normal limits. Have your blood sugar (glucose) level and cholesterol levels checked regularly. Follow your health care provider's instructions on how to keep levels within normal limits. Activity  Stay physically active and exercise regularly. Talk with your health care provider about how often you should exercise and ask which types of exercise are best for you. Avoid heavy lifting and activities that take a lot of effort. Ask your health care provider what activities are safe for you. General instructions Take over-the-counter and prescription medicines only as told by your health care provider. Talk with your health care provider about regular screenings to see if the aneurysm is getting bigger. Keep all follow-up visits as told by your health care provider. This is important. Contact a health care provider if you have: Unexplained weight loss. Get help right away if you have: Pain in your upper back, neck, or abdomen. This pain may move into your chest and arms. Trouble swallowing. A cough or hoarseness. Shortness of breath. Summary A thoracic aortic aneurysm is an aneurysm that occurs in the first part of the aorta, between the heart and the diaphragm. As a thoracic aortic aneurysm becomes larger, it can burst (rupture), or blood can flow between the layers of the wall of the aorta through a tear (aorticdissection). These conditions can be life-threatening if they are not diagnosed and treated right away. If you have a thoracic aortic aneurysm, its growth will be closely monitored. Surgical repair may be needed for larger or faster-growing aneurysms. This information is not intended to replace advice given to you by  your health care provider. Make sure you discuss any questions you have with your health care provider. Document Revised: 01/30/2021 Document Reviewed: 01/30/2021 Elsevier Patient Education  [employer].

## 2022-09-18 NOTE — Assessment & Plan Note (Signed)
Follows with Oncology. Already getting CT scans of the chest twice a year and we can follow his TAA with this as well.

## 2022-09-18 NOTE — Assessment & Plan Note (Signed)
Likely the primary cause of his ascending aortic dilatation.

## 2022-09-18 NOTE — Assessment & Plan Note (Signed)
I have independently reviewed his recent CT scan.  He has an a sending aortic aneurysm that measures approximately 4.7 cm in maximal diameter.  This had previously been followed by a cardiothoracic surgeon who has retired.  We will be happy to follow this.  It does not require any intervention or surgery at this time.  We discussed that if it grows to a size of greater than 6 cm, he would likely have to be referred for evaluation by cardiothoracic surgery for repair.  He is already getting CT scans every 6 months for his lung cancer so we can follow his aneurysm with those scans as well.  I will see him annually.

## 2022-09-19 ENCOUNTER — Other Ambulatory Visit: Payer: Self-pay

## 2022-09-21 ENCOUNTER — Inpatient Hospital Stay (HOSPITAL_BASED_OUTPATIENT_CLINIC_OR_DEPARTMENT_OTHER): Payer: BC Managed Care – PPO | Admitting: Internal Medicine

## 2022-09-21 ENCOUNTER — Encounter: Payer: Self-pay | Admitting: Internal Medicine

## 2022-09-21 ENCOUNTER — Inpatient Hospital Stay: Payer: BC Managed Care – PPO

## 2022-09-21 VITALS — BP 144/82 | HR 100 | Temp 98.1°F | Resp 18 | Ht 70.0 in | Wt 215.8 lb

## 2022-09-21 DIAGNOSIS — C3402 Malignant neoplasm of left main bronchus: Secondary | ICD-10-CM

## 2022-09-21 DIAGNOSIS — Z5111 Encounter for antineoplastic chemotherapy: Secondary | ICD-10-CM

## 2022-09-21 DIAGNOSIS — E538 Deficiency of other specified B group vitamins: Secondary | ICD-10-CM | POA: Diagnosis not present

## 2022-09-21 DIAGNOSIS — Z79899 Other long term (current) drug therapy: Secondary | ICD-10-CM | POA: Diagnosis not present

## 2022-09-21 DIAGNOSIS — I712 Thoracic aortic aneurysm, without rupture, unspecified: Secondary | ICD-10-CM | POA: Diagnosis not present

## 2022-09-21 DIAGNOSIS — R609 Edema, unspecified: Secondary | ICD-10-CM | POA: Diagnosis not present

## 2022-09-21 DIAGNOSIS — M7989 Other specified soft tissue disorders: Secondary | ICD-10-CM | POA: Diagnosis not present

## 2022-09-21 LAB — CBC WITH DIFFERENTIAL/PLATELET
Abs Immature Granulocytes: 0.03 10*3/uL (ref 0.00–0.07)
Basophils Absolute: 0 10*3/uL (ref 0.0–0.1)
Basophils Relative: 1 %
Eosinophils Absolute: 0.1 10*3/uL (ref 0.0–0.5)
Eosinophils Relative: 2 %
HCT: 41.7 % (ref 39.0–52.0)
Hemoglobin: 14.4 g/dL (ref 13.0–17.0)
Immature Granulocytes: 1 %
Lymphocytes Relative: 30 %
Lymphs Abs: 1.9 10*3/uL (ref 0.7–4.0)
MCH: 33 pg (ref 26.0–34.0)
MCHC: 34.5 g/dL (ref 30.0–36.0)
MCV: 95.4 fL (ref 80.0–100.0)
Monocytes Absolute: 0.6 10*3/uL (ref 0.1–1.0)
Monocytes Relative: 9 %
Neutro Abs: 3.5 10*3/uL (ref 1.7–7.7)
Neutrophils Relative %: 57 %
Platelets: 147 10*3/uL — ABNORMAL LOW (ref 150–400)
RBC: 4.37 MIL/uL (ref 4.22–5.81)
RDW: 12.6 % (ref 11.5–15.5)
WBC: 6.1 10*3/uL (ref 4.0–10.5)
nRBC: 0 % (ref 0.0–0.2)

## 2022-09-21 LAB — COMPREHENSIVE METABOLIC PANEL
ALT: 21 U/L (ref 0–44)
AST: 32 U/L (ref 15–41)
Albumin: 3.8 g/dL (ref 3.5–5.0)
Alkaline Phosphatase: 47 U/L (ref 38–126)
Anion gap: 8 (ref 5–15)
BUN: 17 mg/dL (ref 6–20)
CO2: 24 mmol/L (ref 22–32)
Calcium: 8.7 mg/dL — ABNORMAL LOW (ref 8.9–10.3)
Chloride: 101 mmol/L (ref 98–111)
Creatinine, Ser: 1.06 mg/dL (ref 0.61–1.24)
GFR, Estimated: >60 mL/min (ref >60)
Glucose, Bld: 220 mg/dL — ABNORMAL HIGH (ref 70–99)
Potassium: 3.6 mmol/L (ref 3.5–5.1)
Sodium: 133 mmol/L — ABNORMAL LOW (ref 135–145)
Total Bilirubin: 0.7 mg/dL (ref 0.3–1.2)
Total Protein: 7.3 g/dL (ref 6.5–8.1)

## 2022-09-21 MED ORDER — PROCHLORPERAZINE MALEATE 10 MG PO TABS
10.0000 mg | ORAL_TABLET | Freq: Once | ORAL | Status: AC
  Administered 2022-09-21: 10 mg via ORAL
  Filled 2022-09-21: qty 1

## 2022-09-21 MED ORDER — SODIUM CHLORIDE 0.9 % IV SOLN
Freq: Once | INTRAVENOUS | Status: AC
  Filled 2022-09-21: qty 250

## 2022-09-21 MED ORDER — SODIUM CHLORIDE 0.9 % IV SOLN: 6.0000 mg | Freq: Once | INTRAVENOUS | Status: DC

## 2022-09-21 MED ORDER — SODIUM CHLORIDE 0.9 % IV SOLN
500.0000 mg/m2 | Freq: Once | INTRAVENOUS | Status: AC
  Administered 2022-09-21: 1100 mg via INTRAVENOUS
  Filled 2022-09-21: qty 4

## 2022-09-21 MED ORDER — HEPARIN SOD (PORK) LOCK FLUSH 100 UNIT/ML IV SOLN
500.0000 [IU] | Freq: Once | INTRAVENOUS | Status: AC | PRN
  Administered 2022-09-21: 500 [IU]
  Filled 2022-09-21: qty 5

## 2022-09-21 MED ORDER — DEXAMETHASONE SODIUM PHOSPHATE 10 MG/ML IJ SOLN
6.0000 mg | Freq: Once | INTRAMUSCULAR | Status: AC
  Administered 2022-09-21: 6 mg via INTRAVENOUS
  Filled 2022-09-21: qty 1

## 2022-09-21 MED ORDER — SODIUM CHLORIDE 0.9% FLUSH
10.0000 mL | Freq: Once | INTRAVENOUS | Status: AC
  Administered 2022-09-21: 10 mL via INTRAVENOUS
  Filled 2022-09-21: qty 10

## 2022-09-21 NOTE — Patient Instructions (Signed)
[facility] CANCER CTR AT [place]  Discharge Instructions: Thank you for choosing [facility] to provide your oncology and hematology care.  If you have a lab appointment with the [facility], please go directly to the [facility] and check in at the registration area.  Wear comfortable clothing and clothing appropriate for easy access to any Portacath or PICC line.   We strive to give you quality time with your provider. You may need to reschedule your appointment if you arrive late (15 or more minutes).  Arriving late affects you and other patients whose appointments are after yours.  Also, if you miss three or more appointments without notifying the office, you may be dismissed from the clinic at the provider's discretion.      For prescription refill requests, have your pharmacy contact our office and allow 72 hours for refills to be completed.    Today you received the following chemotherapy and/or immunotherapy agents Pemetrexed.      To help prevent nausea and vomiting after your treatment, we encourage you to take your nausea medication as directed.  BELOW ARE SYMPTOMS THAT SHOULD BE REPORTED IMMEDIATELY: *FEVER GREATER THAN 100.4 F (38 C) OR HIGHER *CHILLS OR SWEATING *NAUSEA AND VOMITING THAT IS NOT CONTROLLED WITH YOUR NAUSEA MEDICATION *UNUSUAL SHORTNESS OF BREATH *UNUSUAL BRUISING OR BLEEDING *URINARY PROBLEMS (pain or burning when urinating, or frequent urination) *BOWEL PROBLEMS (unusual diarrhea, constipation, pain near the anus) TENDERNESS IN MOUTH AND THROAT WITH OR WITHOUT PRESENCE OF ULCERS (sore throat, sores in mouth, or a toothache) UNUSUAL RASH, SWELLING OR PAIN  UNUSUAL VAGINAL DISCHARGE OR ITCHING   Items with * indicate a potential emergency and should be followed up as soon as possible or go to the Emergency Department if any problems should occur.  Please show the CHEMOTHERAPY ALERT CARD or IMMUNOTHERAPY ALERT CARD at check-in to  the Emergency Department and triage nurse.  Should you have questions after your visit or need to cancel or reschedule your appointment, please contact [facility] CANCER [place] AT [place]  [phone] and follow the prompts.  Office hours are 8:00 a.m. to 4:30 p.m. Monday - Friday. Please note that voicemails left after 4:00 p.m. may not be returned until the following business day.  We are closed weekends and major holidays. You have access to a nurse at all times for urgent questions. Please call the main number to the clinic [phone] and follow the prompts.  For any non-urgent questions, you may also contact your provider using MyChart. We now offer e-Visits for anyone 60 and older to request care online for non-urgent symptoms. For details visit [email or web address].   Also download the MyChart app! Go to the app store, search "MyChart", open the app, select [facility], and log in with your MyChart username and password.  Masks are optional in the cancer centers. If you would like for your care team to wear a mask while they are taking care of you, please let them know. For doctor visits, patients may have with them one support person who is at least 60 years old. At this time, visitors are not allowed in the infusion area.

## 2022-09-21 NOTE — Progress Notes (Signed)
[facility] OFFICE PROGRESS NOTE  Patient Care Team: [name], MD as PCP - General (Family Medicine) [name], MD as Consulting Physician (Internal Medicine)   Cancer Staging  No matching staging information was found for the patient.   Oncology History Overview Note  # 2012- METASTATIC ADENO CA of LEFT LUNG [acinar pattern] s/p MED LN Bx [NEG- EGFR/K-ras/ALK mutation];[name]; [name]; CT JULY  2016- NED; JAN 2018- NED;   # AUG 2020-clinical trial closed; continue maintenance Alimta  # Thoracic aortic aneurysm-4.0 cm in 2016; currently 4.6 cm.  s/p referral to Dr. Faith Rogue.; MRI in 6 months planned.  ----------------------------------------------------------    DIAGNOSIS: Mitchell Mosley ] Adenocarcinoma lung  STAGE: 4       ;GOALS: Palliative  CURRENT/MOST RECENT THERAPY '[ ]'  Alimta maintenance    Cancer of hilus of left lung ([place])  07/20/2016 Initial Diagnosis   Cancer of hilus of left lung ([place])   12/11/2019 - 06/29/2022 Chemotherapy   Patient is on Treatment Plan : LUNG Pemetrexed (Alimta) q21d      12/11/2019 -  Chemotherapy   Patient is on Treatment Plan : LUNG Pemetrexed (500) q21d       INTERVAL HISTORY: Alone.  Ambulating independently.  Mitchell Mosley 61 y.o.  male pleasant patient above history of metastatic adenocarcinoma the lung-NED currently on maintenance Alimta is here for follow-up.   Patient denies new problems/concerns today.   No new shortness of breath or cough he denies any worsening joint pains. Currently  on Lasix/Aldactone for chronic lower extremity swelling.  Review of Systems  Constitutional:  Negative for chills, diaphoresis, fever, malaise/fatigue and weight loss.  HENT:  Negative for nosebleeds and sore throat.   Eyes:  Negative for double vision.  Respiratory:  Negative for hemoptysis, shortness of breath and  wheezing.   Cardiovascular:  Positive for leg swelling. Negative for chest pain, palpitations and orthopnea.  Gastrointestinal:  Negative for abdominal pain, blood in stool, constipation, diarrhea, heartburn, melena, nausea and vomiting.  Genitourinary:  Negative for dysuria, frequency and urgency.  Musculoskeletal:  Positive for joint pain.  Skin: Negative.  Negative for itching and rash.  Neurological:  Negative for dizziness, tingling, focal weakness, weakness and headaches.  Endo/Heme/Allergies:  Does not bruise/bleed easily.  Psychiatric/Behavioral:  Negative for depression. The patient is not nervous/anxious and does not have insomnia.       PAST MEDICAL HISTORY :  Past Medical History:  Diagnosis Date  . Allergy   . Lung cancer ([place])   . Mild valvular heart disease Nov. 2014   per 2 D Echocardiogram done for persistent leg edema, monitored by cardiology  . Peripheral edema   . Thoracic aortic aneurysm (TAA) ([facility])     PAST SURGICAL HISTORY :   Past Surgical History:  Procedure Laterality Date  . ELBOW SURGERY Right 2011  . IR CV LINE INJECTION  09/11/2021  . Left anterior thoracotomy with biopsy  March 2012  . PORTACATH PLACEMENT  March 2012  . TONSILLECTOMY  as child    FAMILY HISTORY :   Family History  Problem Relation Age of Onset  . Leukemia Father   . Diabetes Mother     SOCIAL HISTORY:   Social History   Tobacco Use  . Smoking status: Never  . Smokeless tobacco: Never  Vaping Use  . Vaping Use: Never used  Substance Use Topics  . Alcohol use:  Yes    Alcohol/week: 0.0 standard drinks of alcohol    Comment: "social drinker"   . Drug use: No    ALLERGIES:  is allergic to penicillins.  MEDICATIONS:  Current Outpatient Medications  Medication Sig Dispense Refill  . fluticasone (FLONASE) 50 MCG/ACT nasal spray Place 2 sprays into both nostrils daily. 16 g 6  . folic acid (FOLVITE) 1 MG tablet TAKE 1 TABLET BY MOUTH EVERY DAY 90 tablet 3  .  furosemide (LASIX) 20 MG tablet TAKE 1 TABLET (20 MG TOTAL) BY MOUTH DAILY AS NEEDED FOR SWELLING 90 tablet 1  . Multiple Vitamin (MULTIVITAMIN) capsule Take 1 capsule by mouth daily. Reported on 02/24/2016    . NON FORMULARY Relief Factor with vitamin D and Turmeric    . Omega-3 Fatty Acids (FISH OIL) 1000 MG CAPS Take 1 capsule by mouth daily. Reported on 02/24/2016    . senna (SENOKOT) 8.6 MG tablet Take 1 tablet by mouth daily. Take as needed around time of chemotherapy    . sildenafil (VIAGRA) 100 MG tablet TAKE 1 TABLET BY MOUTH EVERY DAY AS NEEDED FOR ERECTILE DYSFUNCTION 15 tablet 11  . spironolactone (ALDACTONE) 25 MG tablet TAKE 1 TABLET BY MOUTH EVERY DAY 90 tablet 3  . tadalafil (CIALIS) 20 MG tablet Take one tablet every 2 days as needed 50 tablet 5  . vitamin B-12 (CYANOCOBALAMIN) 1000 MCG tablet Take 1,000 mcg by mouth daily.    Marland Kitchen HYDROcodone-acetaminophen (NORCO) 5-325 MG tablet Take 1 tablet by mouth every 6 (six) hours as needed for moderate pain. (Patient not taking: Reported on 03/16/2022) 30 tablet 0  . meloxicam (MOBIC) 15 MG tablet Take 15 mg by mouth daily. (Patient not taking: Reported on 03/16/2022)     No current facility-administered medications for this visit.   Facility-Administered Medications Ordered in Other Visits  Medication Dose Route Frequency Provider Last Rate Last Admin  . 0.9 %  sodium chloride infusion   Intravenous Once Charlaine Dalton R, MD      . dexamethasone (DECADRON) 6 mg in sodium chloride 0.9 % 50 mL IVPB  6 mg Intravenous Once Charlaine Dalton R, MD      . heparin lock flush 100 unit/mL  500 Units Intravenous Once Charlaine Dalton R, MD      . PEMEtrexed (ALIMTA) 1,100 mg in sodium chloride 0.9 % 100 mL chemo infusion  500 mg/m2 (Order-Specific) Intravenous Once Charlaine Dalton R, MD      . prochlorperazine (COMPAZINE) tablet 10 mg  10 mg Oral Once Charlaine Dalton R, MD      . sodium chloride 0.9 % injection 10 mL  10 mL  Intracatheter PRN [name], MD   10 mL at 05/27/15 1543  . sodium chloride 0.9 % injection 10 mL  10 mL Intracatheter PRN [name], MD   10 mL at 06/17/15 1510  . sodium chloride 0.9 % injection 10 mL  10 mL Intravenous PRN Choksi, Delorise Shiner, MD   10 mL at 07/08/15 1340  . sodium chloride flush (NS) 0.9 % injection 10 mL  10 mL Intravenous PRN Verlon Au, NP   10 mL at 09/30/20 1412    PHYSICAL EXAMINATION: ECOG PERFORMANCE STATUS: 0 - Asymptomatic  BP (!) 144/82 (BP Location: Left Arm, Patient Position: Sitting)   Pulse 100   Temp 98.1 F (36.7 C) (Tympanic)   Resp 18   Ht '5\' 10"'  (1.778 m)   Wt 215 lb 12.8 oz (97.9 kg)   BMI 30.96  kg/m   Filed Weights   09/21/22 1300  Weight: 215 lb 12.8 oz (97.9 kg)     Physical Exam HENT:     Head: Normocephalic and atraumatic.     Mouth/Throat:     Pharynx: No oropharyngeal exudate.  Eyes:     Pupils: Pupils are equal, round, and reactive to light.  Cardiovascular:     Rate and Rhythm: Normal rate and regular rhythm.  Pulmonary:     Effort: No respiratory distress.     Breath sounds: No wheezing.  Abdominal:     General: Bowel sounds are normal. There is no distension.     Palpations: Abdomen is soft. There is no mass.     Tenderness: There is no abdominal tenderness. There is no guarding or rebound.  Musculoskeletal:        General: No tenderness. Normal range of motion.     Cervical back: Normal range of motion and neck supple.     Comments: Grade 1 swelling in the legs bilaterally.  Skin:    General: Skin is warm.  Neurological:     Mental Status: He is alert and oriented to person, place, and time.  Psychiatric:        Mood and Affect: Affect normal.     LABORATORY DATA:  I have reviewed the data as listed    Component Value Date/Time   NA 133 (L) 09/21/2022 1314   NA 137 06/19/2021 1121   NA 138 12/10/2014 1341   K 3.6 09/21/2022 1314   K 3.6 12/10/2014 1341   CL 101 09/21/2022 1314   CL 102  12/10/2014 1341   CO2 24 09/21/2022 1314   CO2 30 12/10/2014 1341   GLUCOSE 220 (H) 09/21/2022 1314   GLUCOSE 160 (H) 12/10/2014 1341   BUN 17 09/21/2022 1314   BUN 18 06/19/2021 1121   BUN 21 (H) 12/10/2014 1341   CREATININE 1.06 09/21/2022 1314   CREATININE 1.10 03/25/2015 1453   CALCIUM 8.7 (L) 09/21/2022 1314   CALCIUM 8.4 (L) 12/10/2014 1341   PROT 7.3 09/21/2022 1314   PROT 7.3 06/19/2021 1121   PROT 6.6 12/10/2014 1341   ALBUMIN 3.8 09/21/2022 1314   ALBUMIN 4.5 06/19/2021 1121   ALBUMIN 3.4 12/10/2014 1341   AST 32 09/21/2022 1314   AST 17 12/10/2014 1341   ALT 21 09/21/2022 1314   ALT 23 12/10/2014 1341   ALKPHOS 47 09/21/2022 1314   ALKPHOS 50 12/10/2014 1341   BILITOT 0.7 09/21/2022 1314   BILITOT 0.2 06/19/2021 1121   BILITOT 0.5 12/10/2014 1341   GFRNONAA >60 09/21/2022 1314   GFRNONAA >60 03/25/2015 1453   GFRAA >60 08/19/2020 1324   GFRAA >60 03/25/2015 1453    No results found for: "SPEP", "UPEP"  Lab Results  Component Value Date   WBC 6.1 09/21/2022   NEUTROABS 3.5 09/21/2022   HGB 14.4 09/21/2022   HCT 41.7 09/21/2022   MCV 95.4 09/21/2022   PLT 147 (L) 09/21/2022      Chemistry      Component Value Date/Time   NA 133 (L) 09/21/2022 1314   NA 137 06/19/2021 1121   NA 138 12/10/2014 1341   K 3.6 09/21/2022 1314   K 3.6 12/10/2014 1341   CL 101 09/21/2022 1314   CL 102 12/10/2014 1341   CO2 24 09/21/2022 1314   CO2 30 12/10/2014 1341   BUN 17 09/21/2022 1314   BUN 18 06/19/2021 1121   BUN 21 (H) 12/10/2014  1341   CREATININE 1.06 09/21/2022 1314   CREATININE 1.10 03/25/2015 1453      Component Value Date/Time   CALCIUM 8.7 (L) 09/21/2022 1314   CALCIUM 8.4 (L) 12/10/2014 1341   ALKPHOS 47 09/21/2022 1314   ALKPHOS 50 12/10/2014 1341   AST 32 09/21/2022 1314   AST 17 12/10/2014 1341   ALT 21 09/21/2022 1314   ALT 23 12/10/2014 1341   BILITOT 0.7 09/21/2022 1314   BILITOT 0.2 06/19/2021 1121   BILITOT 0.5 12/10/2014 1341        RADIOGRAPHIC STUDIES: I have personally reviewed the radiological images as listed and agreed with the findings in the report. No results found.   ASSESSMENT & PLAN:  Cancer of hilus of left lung ([place]) # Adenocarcinoma the lung; stage IV;  CT JULY 2023- CT No evidence of recurrent or metastatic carcinoma within the chest, abdomen, or pelvis. STABLE  # proceed with Alimta chemotherapy. Labs today reviewed;  acceptable for treatment today.   # Thoracic aneurysm-approximately 4.5 cm in size; on surveillance; CT JULY 2023-4.7 cm.  S/p evaluation with 10/17- Dr. Lucky Cowboy.- STABLE.   # Chronic fluid retention-as needed Lasix Aldactone.  No worsening of renal function.- STABLE.   # port malfunction: fibrin sheath [2022] s/p TPA-; dysfunction noted- OK today. STABLE.   b12 -9/8- q9W;    # DISPOSITION:   # proceed with Alimta today;   # Follow up 3 weeks- Friday MD-labs- cbc/cmp; Alimta; B12 injection Dr.B     Orders Placed This Encounter  Procedures  . CBC with Differential    Standing Status:   Future    Standing Expiration Date:   10/13/2023  . Comprehensive metabolic panel    Standing Status:   Future    Standing Expiration Date:   10/13/2023     All questions were answered. The patient knows to call the clinic with any problems, questions or concerns.      [name], MD 09/21/2022 2:13 PM

## 2022-09-21 NOTE — Progress Notes (Signed)
Patient denies new problems/concerns today.    BP 144/82, HR 100

## 2022-09-21 NOTE — Assessment & Plan Note (Addendum)
#   Adenocarcinoma the lung; stage IV;  CT JULY 2023- CT No evidence of recurrent or metastatic carcinoma within the chest, abdomen, or pelvis. STABLE  # proceed with Alimta chemotherapy. Labs today reviewed;  acceptable for treatment today.   # Thoracic aneurysm-approximately 4.5 cm in size; on surveillance; CT JULY 2023-4.7 cm.  S/p evaluation with 10/17- Dr. Lucky Cowboy.- STABLE.   # Chronic fluid retention-as needed Lasix Aldactone.  No worsening of renal function.- STABLE.   # port malfunction: fibrin sheath [2022] s/p TPA-; dysfunction noted- OK today. STABLE.   b12 -9/8- q9W;    # DISPOSITION:   # proceed with Alimta today;   # Follow up 3 weeks- Friday MD-labs- cbc/cmp; Alimta; B12 injection Dr.B

## 2022-10-01 ENCOUNTER — Encounter (INDEPENDENT_AMBULATORY_CARE_PROVIDER_SITE_OTHER): Payer: Self-pay

## 2022-10-03 ENCOUNTER — Other Ambulatory Visit: Payer: Self-pay | Admitting: Internal Medicine

## 2022-10-12 ENCOUNTER — Ambulatory Visit: Payer: BC Managed Care – PPO | Admitting: Internal Medicine

## 2022-10-12 ENCOUNTER — Other Ambulatory Visit: Payer: BC Managed Care – PPO

## 2022-10-12 ENCOUNTER — Ambulatory Visit: Payer: BC Managed Care – PPO

## 2022-11-01 ENCOUNTER — Other Ambulatory Visit: Payer: Self-pay | Admitting: *Deleted

## 2022-11-01 DIAGNOSIS — C3402 Malignant neoplasm of left main bronchus: Secondary | ICD-10-CM

## 2022-11-02 ENCOUNTER — Inpatient Hospital Stay: Payer: BC Managed Care – PPO | Attending: Internal Medicine

## 2022-11-02 ENCOUNTER — Inpatient Hospital Stay: Payer: BC Managed Care – PPO

## 2022-11-02 ENCOUNTER — Inpatient Hospital Stay (HOSPITAL_BASED_OUTPATIENT_CLINIC_OR_DEPARTMENT_OTHER): Payer: BC Managed Care – PPO | Admitting: Internal Medicine

## 2022-11-02 VITALS — BP 130/79 | HR 85 | Temp 96.3°F | Resp 18 | Ht 70.0 in | Wt 219.6 lb

## 2022-11-02 DIAGNOSIS — R609 Edema, unspecified: Secondary | ICD-10-CM | POA: Insufficient documentation

## 2022-11-02 DIAGNOSIS — E538 Deficiency of other specified B group vitamins: Secondary | ICD-10-CM | POA: Diagnosis present

## 2022-11-02 DIAGNOSIS — M549 Dorsalgia, unspecified: Secondary | ICD-10-CM | POA: Insufficient documentation

## 2022-11-02 DIAGNOSIS — Z5111 Encounter for antineoplastic chemotherapy: Secondary | ICD-10-CM | POA: Insufficient documentation

## 2022-11-02 DIAGNOSIS — C3402 Malignant neoplasm of left main bronchus: Secondary | ICD-10-CM

## 2022-11-02 DIAGNOSIS — M7989 Other specified soft tissue disorders: Secondary | ICD-10-CM | POA: Diagnosis not present

## 2022-11-02 DIAGNOSIS — Z79899 Other long term (current) drug therapy: Secondary | ICD-10-CM | POA: Diagnosis not present

## 2022-11-02 DIAGNOSIS — D696 Thrombocytopenia, unspecified: Secondary | ICD-10-CM

## 2022-11-02 DIAGNOSIS — M25569 Pain in unspecified knee: Secondary | ICD-10-CM | POA: Diagnosis not present

## 2022-11-02 DIAGNOSIS — I712 Thoracic aortic aneurysm, without rupture, unspecified: Secondary | ICD-10-CM | POA: Insufficient documentation

## 2022-11-02 LAB — CBC WITH DIFFERENTIAL/PLATELET
Abs Immature Granulocytes: 0.02 10*3/uL (ref 0.00–0.07)
Basophils Absolute: 0.1 10*3/uL (ref 0.0–0.1)
Basophils Relative: 1 %
Eosinophils Absolute: 0.2 10*3/uL (ref 0.0–0.5)
Eosinophils Relative: 3 %
HCT: 42.8 % (ref 39.0–52.0)
Hemoglobin: 14.7 g/dL (ref 13.0–17.0)
Immature Granulocytes: 0 %
Lymphocytes Relative: 33 %
Lymphs Abs: 2.1 10*3/uL (ref 0.7–4.0)
MCH: 32.5 pg (ref 26.0–34.0)
MCHC: 34.3 g/dL (ref 30.0–36.0)
MCV: 94.5 fL (ref 80.0–100.0)
Monocytes Absolute: 0.5 10*3/uL (ref 0.1–1.0)
Monocytes Relative: 8 %
Neutro Abs: 3.4 10*3/uL (ref 1.7–7.7)
Neutrophils Relative %: 55 %
Platelets: 143 10*3/uL — ABNORMAL LOW (ref 150–400)
RBC: 4.53 MIL/uL (ref 4.22–5.81)
RDW: 12.1 % (ref 11.5–15.5)
WBC: 6.3 10*3/uL (ref 4.0–10.5)
nRBC: 0 % (ref 0.0–0.2)

## 2022-11-02 LAB — COMPREHENSIVE METABOLIC PANEL
ALT: 19 U/L (ref 0–44)
AST: 27 U/L (ref 15–41)
Albumin: 3.8 g/dL (ref 3.5–5.0)
Alkaline Phosphatase: 48 U/L (ref 38–126)
Anion gap: 8 (ref 5–15)
BUN: 21 mg/dL — ABNORMAL HIGH (ref 6–20)
CO2: 24 mmol/L (ref 22–32)
Calcium: 8.8 mg/dL — ABNORMAL LOW (ref 8.9–10.3)
Chloride: 102 mmol/L (ref 98–111)
Creatinine, Ser: 1.34 mg/dL — ABNORMAL HIGH (ref 0.61–1.24)
GFR, Estimated: >60 mL/min (ref >60)
Glucose, Bld: 126 mg/dL — ABNORMAL HIGH (ref 70–99)
Potassium: 4.1 mmol/L (ref 3.5–5.1)
Sodium: 134 mmol/L — ABNORMAL LOW (ref 135–145)
Total Bilirubin: 0.7 mg/dL (ref 0.3–1.2)
Total Protein: 7.2 g/dL (ref 6.5–8.1)

## 2022-11-02 MED ORDER — SODIUM CHLORIDE 0.9 % IV SOLN: 6.0000 mg | Freq: Once | INTRAVENOUS | Status: DC

## 2022-11-02 MED ORDER — DEXAMETHASONE SODIUM PHOSPHATE 10 MG/ML IJ SOLN
6.0000 mg | Freq: Once | INTRAMUSCULAR | Status: AC
  Administered 2022-11-02: 6 mg via INTRAVENOUS
  Filled 2022-11-02: qty 1

## 2022-11-02 MED ORDER — SODIUM CHLORIDE 0.9 % IV SOLN
Freq: Once | INTRAVENOUS | Status: AC
  Filled 2022-11-02: qty 250

## 2022-11-02 MED ORDER — CYANOCOBALAMIN 1000 MCG/ML IJ SOLN
1000.0000 ug | Freq: Once | INTRAMUSCULAR | Status: AC
  Administered 2022-11-02: 1000 ug via INTRAMUSCULAR
  Filled 2022-11-02: qty 1

## 2022-11-02 MED ORDER — SODIUM CHLORIDE 0.9 % IV SOLN
500.0000 mg/m2 | Freq: Once | INTRAVENOUS | Status: AC
  Administered 2022-11-02: 1100 mg via INTRAVENOUS
  Filled 2022-11-02: qty 4

## 2022-11-02 MED ORDER — PROCHLORPERAZINE MALEATE 10 MG PO TABS
10.0000 mg | ORAL_TABLET | Freq: Once | ORAL | Status: AC
  Administered 2022-11-02: 10 mg via ORAL
  Filled 2022-11-02: qty 1

## 2022-11-02 MED ORDER — HEPARIN SOD (PORK) LOCK FLUSH 100 UNIT/ML IV SOLN
500.0000 [IU] | Freq: Once | INTRAVENOUS | Status: AC | PRN
  Administered 2022-11-02: 500 [IU]
  Filled 2022-11-02: qty 5

## 2022-11-02 NOTE — Progress Notes (Signed)
[place] OFFICE PROGRESS NOTE  Patient Care Team: [name], MD as PCP - General (Family Medicine) [name], MD as Consulting Physician (Internal Medicine)   Cancer Staging  No matching staging information was found for the patient.   Oncology History Overview Note  # 2012- METASTATIC ADENO CA of LEFT LUNG [acinar pattern] s/p MED LN Bx [NEG- EGFR/K-ras/ALK mutation];[name]; [name]; CT JULY  2016- NED; JAN 2018- NED;   # AUG 2020-clinical trial closed; continue maintenance Alimta  # Thoracic aortic aneurysm-4.0 cm in 2016; currently 4.6 cm.  s/p referral to Dr. Faith Rogue.; MRI in 6 months planned.  ----------------------------------------------------------    DIAGNOSIS: Mitchell Mosley ] Adenocarcinoma lung  STAGE: 4       ;GOALS: Palliative  CURRENT/MOST RECENT THERAPY _0  Alimta maintenance    Cancer of hilus of left lung ([place])  07/20/2016 Initial Diagnosis   Cancer of hilus of left lung ([facility])   12/11/2019 - 06/29/2022 Chemotherapy   Patient is on Treatment Plan : LUNG Pemetrexed (Alimta) q21d      12/11/2019 -  Chemotherapy   Patient is on Treatment Plan : LUNG Pemetrexed (500) q21d       INTERVAL HISTORY: Alone.  Ambulating independently.  Mitchell Mosley 61 y.o.  male pleasant patient above history of metastatic adenocarcinoma the lung-NED currently on maintenance Alimta is here for follow-up.   The interim patient noted to have back pain.  Which is chronic.  He is currently being followed by chiropractor.  Improving.  Also minor knee pain not any worse.  No new shortness of breath or cough. Currently  on Lasix/Aldactone for chronic lower extremity swelling.  Review of Systems  Constitutional:  Negative for chills, diaphoresis, fever, malaise/fatigue and weight loss.  HENT:  Negative for nosebleeds and sore throat.   Eyes:  Negative for  double vision.  Respiratory:  Negative for hemoptysis, shortness of breath and wheezing.   Cardiovascular:  Positive for leg swelling. Negative for chest pain, palpitations and orthopnea.  Gastrointestinal:  Negative for abdominal pain, blood in stool, constipation, diarrhea, heartburn, melena, nausea and vomiting.  Genitourinary:  Negative for dysuria, frequency and urgency.  Musculoskeletal:  Positive for joint pain.  Skin: Negative.  Negative for itching and rash.  Neurological:  Negative for dizziness, tingling, focal weakness, weakness and headaches.  Endo/Heme/Allergies:  Does not bruise/bleed easily.  Psychiatric/Behavioral:  Negative for depression. The patient is not nervous/anxious and does not have insomnia.       PAST MEDICAL HISTORY :  Past Medical History:  Diagnosis Date   Allergy    Lung cancer ([place])    Mild valvular heart disease Nov. 2014   per 2 D Echocardiogram done for persistent leg edema, monitored by cardiology   Peripheral edema    Thoracic aortic aneurysm (TAA) ([place])     PAST SURGICAL HISTORY :   Past Surgical History:  Procedure Laterality Date   ELBOW SURGERY Right 2011   IR CV LINE INJECTION  09/11/2021   Left anterior thoracotomy with biopsy  March 2012   PORTACATH PLACEMENT  March 2012   TONSILLECTOMY  as child    FAMILY HISTORY :   Family History  Problem Relation Age of Onset   Leukemia Father    Diabetes Mother     SOCIAL HISTORY:   Social History   Tobacco Use   Smoking status: Never   Smokeless tobacco:  Never  Vaping Use   Vaping Use: Never used  Substance Use Topics   Alcohol use: Yes    Alcohol/week: 0.0 standard drinks of alcohol    Comment: "social drinker"    Drug use: No    ALLERGIES:  is allergic to penicillins.  MEDICATIONS:  Current Outpatient Medications  Medication Sig Dispense Refill   fluticasone (FLONASE) 50 MCG/ACT nasal spray Place 2 sprays into both nostrils daily. 16 g 6   folic acid (FOLVITE) 1 MG  tablet TAKE 1 TABLET BY MOUTH EVERY DAY 90 tablet 3   furosemide (LASIX) 20 MG tablet TAKE 1 TABLET (20 MG TOTAL) BY MOUTH DAILY AS NEEDED FOR SWELLING 90 tablet 1   HYDROcodone-acetaminophen (NORCO) 5-325 MG tablet Take 1 tablet by mouth every 6 (six) hours as needed for moderate pain. 30 tablet 0   meloxicam (MOBIC) 15 MG tablet Take 15 mg by mouth daily.     Multiple Vitamin (MULTIVITAMIN) capsule Take 1 capsule by mouth daily. Reported on 02/24/2016     NON FORMULARY Relief Factor with vitamin D and Turmeric     Omega-3 Fatty Acids (FISH OIL) 1000 MG CAPS Take 1 capsule by mouth daily. Reported on 02/24/2016     senna (SENOKOT) 8.6 MG tablet Take 1 tablet by mouth daily. Take as needed around time of chemotherapy     sildenafil (VIAGRA) 100 MG tablet TAKE 1 TABLET BY MOUTH EVERY DAY AS NEEDED FOR ERECTILE DYSFUNCTION 15 tablet 11   spironolactone (ALDACTONE) 25 MG tablet TAKE 1 TABLET BY MOUTH EVERY DAY 90 tablet 3   tadalafil (CIALIS) 20 MG tablet Take one tablet every 2 days as needed 50 tablet 5   vitamin B-12 (CYANOCOBALAMIN) 1000 MCG tablet Take 1,000 mcg by mouth daily.     No current facility-administered medications for this visit.   Facility-Administered Medications Ordered in Other Visits  Medication Dose Route Frequency Provider Last Rate Last Admin   cyanocobalamin (VITAMIN B12) injection 1,000 mcg  1,000 mcg Intramuscular Once Charlaine Dalton R, MD       heparin lock flush 100 unit/mL  500 Units Intravenous Once Charlaine Dalton R, MD       heparin lock flush 100 unit/mL  500 Units Intracatheter Once PRN [name], MD       PEMEtrexed (ALIMTA) 1,100 mg in sodium chloride 0.9 % 100 mL chemo infusion  500 mg/m2 (Order-Specific) Intravenous Once Charlaine Dalton R, MD       sodium chloride 0.9 % injection 10 mL  10 mL Intracatheter PRN [name], MD   10 mL at 05/27/15 1543   sodium chloride 0.9 % injection 10 mL  10 mL Intracatheter PRN [name],  MD   10 mL at 06/17/15 1510   sodium chloride 0.9 % injection 10 mL  10 mL Intravenous PRN [name], MD   10 mL at 07/08/15 1340   sodium chloride flush (NS) 0.9 % injection 10 mL  10 mL Intravenous PRN Verlon Au, NP   10 mL at 09/30/20 1412    PHYSICAL EXAMINATION: ECOG PERFORMANCE STATUS: 0 - Asymptomatic  There were no vitals taken for this visit.  There were no vitals filed for this visit.    Physical Exam HENT:     Head: Normocephalic and atraumatic.     Mouth/Throat:     Pharynx: No oropharyngeal exudate.  Eyes:     Pupils: Pupils are equal, round, and reactive to light.  Cardiovascular:  Rate and Rhythm: Normal rate and regular rhythm.  Pulmonary:     Effort: No respiratory distress.     Breath sounds: No wheezing.  Abdominal:     General: Bowel sounds are normal. There is no distension.     Palpations: Abdomen is soft. There is no mass.     Tenderness: There is no abdominal tenderness. There is no guarding or rebound.  Musculoskeletal:        General: No tenderness. Normal range of motion.     Cervical back: Normal range of motion and neck supple.     Comments: Grade 1 swelling in the legs bilaterally.  Skin:    General: Skin is warm.  Neurological:     Mental Status: He is alert and oriented to person, place, and time.  Psychiatric:        Mood and Affect: Affect normal.      LABORATORY DATA:  I have reviewed the data as listed    Component Value Date/Time   NA 134 (L) 11/02/2022 1337   NA 137 06/19/2021 1121   NA 138 12/10/2014 1341   K 4.1 11/02/2022 1337   K 3.6 12/10/2014 1341   CL 102 11/02/2022 1337   CL 102 12/10/2014 1341   CO2 24 11/02/2022 1337   CO2 30 12/10/2014 1341   GLUCOSE 126 (H) 11/02/2022 1337   GLUCOSE 160 (H) 12/10/2014 1341   BUN 21 (H) 11/02/2022 1337   BUN 18 06/19/2021 1121   BUN 21 (H) 12/10/2014 1341   CREATININE 1.34 (H) 11/02/2022 1337   CREATININE 1.10 03/25/2015 1453   CALCIUM 8.8 (L) 11/02/2022 1337    CALCIUM 8.4 (L) 12/10/2014 1341   PROT 7.2 11/02/2022 1337   PROT 7.3 06/19/2021 1121   PROT 6.6 12/10/2014 1341   ALBUMIN 3.8 11/02/2022 1337   ALBUMIN 4.5 06/19/2021 1121   ALBUMIN 3.4 12/10/2014 1341   AST 27 11/02/2022 1337   AST 17 12/10/2014 1341   ALT 19 11/02/2022 1337   ALT 23 12/10/2014 1341   ALKPHOS 48 11/02/2022 1337   ALKPHOS 50 12/10/2014 1341   BILITOT 0.7 11/02/2022 1337   BILITOT 0.2 06/19/2021 1121   BILITOT 0.5 12/10/2014 1341   GFRNONAA >60 11/02/2022 1337   GFRNONAA >60 03/25/2015 1453   GFRAA >60 08/19/2020 1324   GFRAA >60 03/25/2015 1453    No results found for: "SPEP", "UPEP"  Lab Results  Component Value Date   WBC 6.3 11/02/2022   NEUTROABS 3.4 11/02/2022   HGB 14.7 11/02/2022   HCT 42.8 11/02/2022   MCV 94.5 11/02/2022   PLT 143 (L) 11/02/2022      Chemistry      Component Value Date/Time   NA 134 (L) 11/02/2022 1337   NA 137 06/19/2021 1121   NA 138 12/10/2014 1341   K 4.1 11/02/2022 1337   K 3.6 12/10/2014 1341   CL 102 11/02/2022 1337   CL 102 12/10/2014 1341   CO2 24 11/02/2022 1337   CO2 30 12/10/2014 1341   BUN 21 (H) 11/02/2022 1337   BUN 18 06/19/2021 1121   BUN 21 (H) 12/10/2014 1341   CREATININE 1.34 (H) 11/02/2022 1337   CREATININE 1.10 03/25/2015 1453      Component Value Date/Time   CALCIUM 8.8 (L) 11/02/2022 1337   CALCIUM 8.4 (L) 12/10/2014 1341   ALKPHOS 48 11/02/2022 1337   ALKPHOS 50 12/10/2014 1341   AST 27 11/02/2022 1337   AST 17 12/10/2014 1341  ALT 19 11/02/2022 1337   ALT 23 12/10/2014 1341   BILITOT 0.7 11/02/2022 1337   BILITOT 0.2 06/19/2021 1121   BILITOT 0.5 12/10/2014 1341       RADIOGRAPHIC STUDIES: I have personally reviewed the radiological images as listed and agreed with the findings in the report. No results found.   ASSESSMENT & PLAN:  Cancer of hilus of left lung ([place]) # Adenocarcinoma the lung; stage IV;  CT JULY 2023- CT No evidence of recurrent or metastatic carcinoma  within the chest, abdomen, or pelvis. STABLE  # proceed with Alimta chemotherapy. Labs today reviewed;  acceptable for treatment today.   # Thoracic aneurysm-approximately 4.5 cm in size; on surveillance; CT JULY 2023-4.7 cm.  S/p evaluation with 10/17- Dr. Lucky Cowboy.- STABLE.   # Chronic fluid retention-as needed Lasix Aldactone.  No worsening of renal function.- STABLE.   # port malfunction: fibrin sheath [2022] s/p TPA-; dysfunction noted- OK today. STABLE.   b12 -12/01; q9W;    # DISPOSITION:   # proceed with Alimta today; B12 injection   # Follow up 5 weeks- Friday MD-labs- cbc/cmp; Alimta; B12 injection Dr.B     Orders Placed This Encounter  Procedures   CBC with Differential    Standing Status:   Future    Standing Expiration Date:   12/08/2023   Comprehensive metabolic panel    Standing Status:   Future    Standing Expiration Date:   12/08/2023     All questions were answered. The patient knows to call the clinic with any problems, questions or concerns.      [name], MD 11/02/2022 2:47 PM

## 2022-11-02 NOTE — Assessment & Plan Note (Addendum)
#   Adenocarcinoma the lung; stage IV;  CT JULY 2023- CT No evidence of recurrent or metastatic carcinoma within the chest, abdomen, or pelvis. STABLE  # proceed with Alimta chemotherapy. Labs today reviewed;  acceptable for treatment today.   # Thoracic aneurysm-approximately 4.5 cm in size; on surveillance; CT JULY 2023-4.7 cm.  S/p evaluation with 10/17- Dr. Lucky Cowboy.- STABLE.   # Chronic fluid retention-as needed Lasix Aldactone.  No worsening of renal function.- STABLE.   # port malfunction: fibrin sheath [2022] s/p TPA-; dysfunction noted- OK today. STABLE.   b12 -12/01; q9W;    # DISPOSITION:   # proceed with Alimta today; B12 injection   # Follow up 5 weeks- Friday MD-labs- cbc/cmp; Alimta; Dr.B

## 2022-11-02 NOTE — Patient Instructions (Signed)
[facility]  Discharge Instructions: Thank you for choosing [facility] to provide your oncology and hematology care.  If you have a lab appointment with the Cancer Center, please go directly to the Cancer Center and check in at the registration area.  Wear comfortable clothing and clothing appropriate for easy access to any Portacath or PICC line.   We strive to give you quality time with your provider. You may need to reschedule your appointment if you arrive late (15 or more minutes).  Arriving late affects you and other patients whose appointments are after yours.  Also, if you miss three or more appointments without notifying the office, you may be dismissed from the clinic at the provider's discretion.      For prescription refill requests, have your pharmacy contact our office and allow 72 hours for refills to be completed.    Today you received the following chemotherapy and/or immunotherapy agents: Alimta      To help prevent nausea and vomiting after your treatment, we encourage you to take your nausea medication as directed.  BELOW ARE SYMPTOMS THAT SHOULD BE REPORTED IMMEDIATELY: *FEVER GREATER THAN 100.4 F (38 C) OR HIGHER *CHILLS OR SWEATING *NAUSEA AND VOMITING THAT IS NOT CONTROLLED WITH YOUR NAUSEA MEDICATION *UNUSUAL SHORTNESS OF BREATH *UNUSUAL BRUISING OR BLEEDING *URINARY PROBLEMS (pain or burning when urinating, or frequent urination) *BOWEL PROBLEMS (unusual diarrhea, constipation, pain near the anus) TENDERNESS IN MOUTH AND THROAT WITH OR WITHOUT PRESENCE OF ULCERS (sore throat, sores in mouth, or a toothache) UNUSUAL RASH, SWELLING OR PAIN  UNUSUAL VAGINAL DISCHARGE OR ITCHING   Items with * indicate a potential emergency and should be followed up as soon as possible or go to the Emergency Department if any problems should occur.  Please show the CHEMOTHERAPY ALERT CARD or IMMUNOTHERAPY ALERT CARD at check-in to the  Emergency Department and triage nurse.  Should you have questions after your visit or need to cancel or reschedule your appointment, please contact [facility]  [phone] and follow the prompts.  Office hours are 8:00 a.m. to 4:30 p.m. Monday - Friday. Please note that voicemails left after 4:00 p.m. may not be returned until the following business day.  We are closed weekends and major holidays. You have access to a nurse at all times for urgent questions. Please call the main number to the clinic [phone] and follow the prompts.  For any non-urgent questions, you may also contact your provider using MyChart. We now offer e-Visits for anyone 18 and older to request care online for non-urgent symptoms. For details visit [email or web address].   Also download the MyChart app! Go to the app store, search "MyChart", open the app, select [facility], and log in with your MyChart username and password.  Masks are optional in the cancer centers. If you would like for your care team to wear a mask while they are taking care of you, please let them know. For doctor visits, patients may have with them one support person who is at least 60 years old. At this time, visitors are not allowed in the infusion area.   

## 2022-11-13 ENCOUNTER — Other Ambulatory Visit: Payer: Self-pay | Admitting: Nurse Practitioner

## 2022-11-13 DIAGNOSIS — R609 Edema, unspecified: Secondary | ICD-10-CM

## 2022-11-13 DIAGNOSIS — C33 Malignant neoplasm of trachea: Secondary | ICD-10-CM

## 2022-11-21 ENCOUNTER — Other Ambulatory Visit: Payer: Self-pay | Admitting: *Deleted

## 2022-11-21 DIAGNOSIS — R609 Edema, unspecified: Secondary | ICD-10-CM

## 2022-11-21 DIAGNOSIS — C33 Malignant neoplasm of trachea: Secondary | ICD-10-CM

## 2022-11-21 MED ORDER — SPIRONOLACTONE 25 MG PO TABS: 25.0000 mg | ORAL_TABLET | Freq: Every day | ORAL | 3 refills | Status: DC

## 2022-12-07 ENCOUNTER — Inpatient Hospital Stay (HOSPITAL_BASED_OUTPATIENT_CLINIC_OR_DEPARTMENT_OTHER): Payer: BC Managed Care – PPO | Admitting: Internal Medicine

## 2022-12-07 ENCOUNTER — Other Ambulatory Visit: Payer: BC Managed Care – PPO

## 2022-12-07 ENCOUNTER — Ambulatory Visit: Payer: BC Managed Care – PPO

## 2022-12-07 ENCOUNTER — Encounter: Payer: Self-pay | Admitting: Internal Medicine

## 2022-12-07 ENCOUNTER — Inpatient Hospital Stay: Payer: BC Managed Care – PPO | Attending: Internal Medicine

## 2022-12-07 ENCOUNTER — Other Ambulatory Visit: Payer: Self-pay

## 2022-12-07 ENCOUNTER — Inpatient Hospital Stay: Payer: BC Managed Care – PPO

## 2022-12-07 ENCOUNTER — Ambulatory Visit: Payer: BC Managed Care – PPO | Admitting: Internal Medicine

## 2022-12-07 VITALS — BP 133/86 | HR 78 | Temp 97.1°F | Resp 16 | Ht 70.0 in | Wt 218.5 lb

## 2022-12-07 DIAGNOSIS — Z5111 Encounter for antineoplastic chemotherapy: Secondary | ICD-10-CM

## 2022-12-07 DIAGNOSIS — M7989 Other specified soft tissue disorders: Secondary | ICD-10-CM | POA: Insufficient documentation

## 2022-12-07 DIAGNOSIS — C3402 Malignant neoplasm of left main bronchus: Secondary | ICD-10-CM | POA: Insufficient documentation

## 2022-12-07 DIAGNOSIS — Z79899 Other long term (current) drug therapy: Secondary | ICD-10-CM | POA: Diagnosis not present

## 2022-12-07 DIAGNOSIS — I712 Thoracic aortic aneurysm, without rupture, unspecified: Secondary | ICD-10-CM | POA: Diagnosis not present

## 2022-12-07 DIAGNOSIS — R609 Edema, unspecified: Secondary | ICD-10-CM | POA: Insufficient documentation

## 2022-12-07 DIAGNOSIS — E538 Deficiency of other specified B group vitamins: Secondary | ICD-10-CM | POA: Insufficient documentation

## 2022-12-07 LAB — COMPREHENSIVE METABOLIC PANEL
ALT: 21 U/L (ref 0–44)
AST: 30 U/L (ref 15–41)
Albumin: 3.9 g/dL (ref 3.5–5.0)
Alkaline Phosphatase: 46 U/L (ref 38–126)
Anion gap: 11 (ref 5–15)
BUN: 20 mg/dL (ref 6–20)
CO2: 26 mmol/L (ref 22–32)
Calcium: 8.5 mg/dL — ABNORMAL LOW (ref 8.9–10.3)
Chloride: 98 mmol/L (ref 98–111)
Creatinine, Ser: 1.02 mg/dL (ref 0.61–1.24)
GFR, Estimated: >60 mL/min (ref >60)
Glucose, Bld: 219 mg/dL — ABNORMAL HIGH (ref 70–99)
Potassium: 3.6 mmol/L (ref 3.5–5.1)
Sodium: 135 mmol/L (ref 135–145)
Total Bilirubin: 0.8 mg/dL (ref 0.3–1.2)
Total Protein: 7.1 g/dL (ref 6.5–8.1)

## 2022-12-07 LAB — CBC WITH DIFFERENTIAL/PLATELET
Abs Immature Granulocytes: 0.02 10*3/uL (ref 0.00–0.07)
Basophils Absolute: 0 10*3/uL (ref 0.0–0.1)
Basophils Relative: 1 %
Eosinophils Absolute: 0.2 10*3/uL (ref 0.0–0.5)
Eosinophils Relative: 3 %
HCT: 41.6 % (ref 39.0–52.0)
Hemoglobin: 14.5 g/dL (ref 13.0–17.0)
Immature Granulocytes: 0 %
Lymphocytes Relative: 30 %
Lymphs Abs: 1.5 10*3/uL (ref 0.7–4.0)
MCH: 31.9 pg (ref 26.0–34.0)
MCHC: 34.9 g/dL (ref 30.0–36.0)
MCV: 91.6 fL (ref 80.0–100.0)
Monocytes Absolute: 0.4 10*3/uL (ref 0.1–1.0)
Monocytes Relative: 9 %
Neutro Abs: 2.8 10*3/uL (ref 1.7–7.7)
Neutrophils Relative %: 57 %
Platelets: 128 10*3/uL — ABNORMAL LOW (ref 150–400)
RBC: 4.54 MIL/uL (ref 4.22–5.81)
RDW: 12.2 % (ref 11.5–15.5)
WBC: 4.9 10*3/uL (ref 4.0–10.5)
nRBC: 0 % (ref 0.0–0.2)

## 2022-12-07 MED ORDER — SODIUM CHLORIDE 0.9% FLUSH
10.0000 mL | INTRAVENOUS | Status: DC | PRN
  Filled 2022-12-07: qty 10

## 2022-12-07 MED ORDER — SODIUM CHLORIDE 0.9 % IV SOLN: 6.0000 mg | Freq: Once | INTRAVENOUS | Status: DC

## 2022-12-07 MED ORDER — PROCHLORPERAZINE MALEATE 10 MG PO TABS
10.0000 mg | ORAL_TABLET | Freq: Once | ORAL | Status: AC
  Administered 2022-12-07: 10 mg via ORAL
  Filled 2022-12-07: qty 1

## 2022-12-07 MED ORDER — SODIUM CHLORIDE 0.9 % IV SOLN
Freq: Once | INTRAVENOUS | Status: AC
  Filled 2022-12-07: qty 250

## 2022-12-07 MED ORDER — HEPARIN SOD (PORK) LOCK FLUSH 100 UNIT/ML IV SOLN
500.0000 [IU] | Freq: Once | INTRAVENOUS | Status: AC | PRN
  Administered 2022-12-07: 500 [IU]
  Filled 2022-12-07: qty 5

## 2022-12-07 MED ORDER — SODIUM CHLORIDE 0.9 % IV SOLN
500.0000 mg/m2 | Freq: Once | INTRAVENOUS | Status: AC
  Administered 2022-12-07: 1100 mg via INTRAVENOUS
  Filled 2022-12-07: qty 4

## 2022-12-07 MED ORDER — DEXAMETHASONE SODIUM PHOSPHATE 10 MG/ML IJ SOLN
6.0000 mg | Freq: Once | INTRAMUSCULAR | Status: AC
  Administered 2022-12-07: 6 mg via INTRAVENOUS
  Filled 2022-12-07: qty 1

## 2022-12-07 NOTE — Progress Notes (Signed)
Patient denies new problems/concerns today.   °

## 2022-12-07 NOTE — Assessment & Plan Note (Addendum)
#   Adenocarcinoma the lung; stage IV;  CT AUG 14th 2023- CT No evidence of recurrent or metastatic carcinoma within the chest, abdomen, or pelvis. STABLE  # proceed with Alimta chemotherapy. Labs today reviewed;  acceptable for treatment today.  Platelets 128.  Will plan imaging in February.  # Thoracic aneurysm-approximately 4.5 cm in size; on surveillance; CT JULY 2023-4.7 cm.  S/p evaluation with 10/17- Dr. Lucky Cowboy.- STABLE.   # Chronic fluid retention-as needed Lasix Aldactone.  No worsening of renal function.- STABLE.   # port malfunction: fibrin sheath [2022] s/p TPA-; dysfunction noted- OK today. STABLE   b12 -12/01; q9W;    # DISPOSITION:  # proceed with Alimta today   # Follow up 4 weeks- Friday MD-labs- cbc/cmp; Alimta; b12 injection- Dr.B

## 2022-12-07 NOTE — Progress Notes (Signed)
[facility] OFFICE PROGRESS NOTE  Patient Care Team: [name], MD as PCP - General (Family Medicine) [name], MD as Consulting Physician (Internal Medicine)   Cancer Staging  No matching staging information was found for the patient.   Oncology History Overview Note  # 2012- METASTATIC ADENO CA of LEFT LUNG [acinar pattern] s/p MED LN Bx [NEG- EGFR/K-ras/ALK mutation];[name]; [name]; CT JULY  2016- NED; JAN 2018- NED;   # AUG 2020-clinical trial closed; continue maintenance Alimta  # Thoracic aortic aneurysm-4.0 cm in 2016; currently 4.6 cm.  s/p referral to Dr. Faith Rogue.; MRI in 6 months planned.  ----------------------------------------------------------    DIAGNOSIS: Mitchell Mosley ] Adenocarcinoma lung  STAGE: 4       ;GOALS: Palliative  CURRENT/MOST RECENT THERAPY [ ]  Alimta maintenance    Cancer of hilus of left lung ([place])  07/20/2016 Initial Diagnosis   Cancer of hilus of left lung ([place])   12/11/2019 - 06/29/2022 Chemotherapy   Patient is on Treatment Plan : LUNG Pemetrexed (Alimta) q21d      12/11/2019 -  Chemotherapy   Patient is on Treatment Plan : LUNG Pemetrexed (500) q21d       INTERVAL HISTORY: Alone.  Ambulating independently.  Mitchell Mosley 62 y.o.  male pleasant patient above history of metastatic adenocarcinoma the lung-NED currently on maintenance Alimta is here for follow-up.   No new shortness of breath or cough. Currently  on Lasix/Aldactone for chronic lower extremity swelling.  Review of Systems  Constitutional:  Negative for chills, diaphoresis, fever, malaise/fatigue and weight loss.  HENT:  Negative for nosebleeds and sore throat.   Eyes:  Negative for double vision.  Respiratory:  Negative for hemoptysis, shortness of breath and wheezing.   Cardiovascular:  Positive for leg swelling. Negative for chest pain,  palpitations and orthopnea.  Gastrointestinal:  Negative for abdominal pain, blood in stool, constipation, diarrhea, heartburn, melena, nausea and vomiting.  Genitourinary:  Negative for dysuria, frequency and urgency.  Musculoskeletal:  Positive for joint pain.  Skin: Negative.  Negative for itching and rash.  Neurological:  Negative for dizziness, tingling, focal weakness, weakness and headaches.  Endo/Heme/Allergies:  Does not bruise/bleed easily.  Psychiatric/Behavioral:  Negative for depression. The patient is not nervous/anxious and does not have insomnia.       PAST MEDICAL HISTORY :  Past Medical History:  Diagnosis Date   Allergy    Lung cancer ([place])    Mild valvular heart disease Nov. 2014   per 2 D Echocardiogram done for persistent leg edema, monitored by cardiology   Peripheral edema    Thoracic aortic aneurysm (TAA) ([place])     PAST SURGICAL HISTORY :   Past Surgical History:  Procedure Laterality Date   ELBOW SURGERY Right 2011   IR CV LINE INJECTION  09/11/2021   Left anterior thoracotomy with biopsy  March 2012   PORTACATH PLACEMENT  March 2012   TONSILLECTOMY  as child    FAMILY HISTORY :   Family History  Problem Relation Age of Onset   Leukemia Father    Diabetes Mother     SOCIAL HISTORY:   Social History   Tobacco Use   Smoking status: Never   Smokeless tobacco: Never  Vaping Use   Vaping Use: Never used  Substance Use Topics   Alcohol use: Yes    Alcohol/week: 0.0 standard drinks of alcohol  Comment: "social drinker"    Drug use: No    ALLERGIES:  is allergic to penicillins.  MEDICATIONS:  Current Outpatient Medications  Medication Sig Dispense Refill   fluticasone (FLONASE) 50 MCG/ACT nasal spray Place 2 sprays into both nostrils daily. 16 g 6   folic acid (FOLVITE) 1 MG tablet TAKE 1 TABLET BY MOUTH EVERY DAY 90 tablet 3   furosemide (LASIX) 20 MG tablet TAKE 1 TABLET (20 MG TOTAL) BY MOUTH DAILY AS NEEDED FOR SWELLING 90 tablet 1    HYDROcodone-acetaminophen (NORCO) 5-325 MG tablet Take 1 tablet by mouth every 6 (six) hours as needed for moderate pain. 30 tablet 0   meloxicam (MOBIC) 15 MG tablet Take 15 mg by mouth daily.     Multiple Vitamin (MULTIVITAMIN) capsule Take 1 capsule by mouth daily. Reported on 02/24/2016     NON FORMULARY Relief Factor with vitamin D and Turmeric     Omega-3 Fatty Acids (FISH OIL) 1000 MG CAPS Take 1 capsule by mouth daily. Reported on 02/24/2016     senna (SENOKOT) 8.6 MG tablet Take 1 tablet by mouth daily. Take as needed around time of chemotherapy     sildenafil (VIAGRA) 100 MG tablet TAKE 1 TABLET BY MOUTH EVERY DAY AS NEEDED FOR ERECTILE DYSFUNCTION 15 tablet 11   spironolactone (ALDACTONE) 25 MG tablet Take 1 tablet (25 mg total) by mouth daily. 90 tablet 3   tadalafil (CIALIS) 20 MG tablet Take one tablet every 2 days as needed 50 tablet 5   vitamin B-12 (CYANOCOBALAMIN) 1000 MCG tablet Take 1,000 mcg by mouth daily.     No current facility-administered medications for this visit.   Facility-Administered Medications Ordered in Other Visits  Medication Dose Route Frequency Provider Last Rate Last Admin   heparin lock flush 100 unit/mL  500 Units Intravenous Once Charlaine Dalton R, MD       sodium chloride 0.9 % injection 10 mL  10 mL Intracatheter PRN [name], MD   10 mL at 05/27/15 1543   sodium chloride 0.9 % injection 10 mL  10 mL Intracatheter PRN [name], MD   10 mL at 06/17/15 1510   sodium chloride 0.9 % injection 10 mL  10 mL Intravenous PRN [name], MD   10 mL at 07/08/15 1340   sodium chloride flush (NS) 0.9 % injection 10 mL  10 mL Intravenous PRN Verlon Au, NP   10 mL at 09/30/20 1412   sodium chloride flush (NS) 0.9 % injection 10 mL  10 mL Intracatheter PRN [name], MD        PHYSICAL EXAMINATION: ECOG PERFORMANCE STATUS: 0 - Asymptomatic  BP 133/86 (BP Location: Left Arm, Patient Position: Sitting)   Pulse 78   Temp  (!) 97.1 F (36.2 C) (Tympanic)   Resp 16   Ht 5\' 10"  (1.778 m)   Wt 218 lb 8 oz (99.1 kg)   BMI 31.35 kg/m   Filed Weights   12/07/22 1100  Weight: 218 lb 8 oz (99.1 kg)      Physical Exam HENT:     Head: Normocephalic and atraumatic.     Mouth/Throat:     Pharynx: No oropharyngeal exudate.  Eyes:     Pupils: Pupils are equal, round, and reactive to light.  Cardiovascular:     Rate and Rhythm: Normal rate and regular rhythm.  Pulmonary:     Effort: No respiratory distress.     Breath sounds: No wheezing.  Abdominal:  General: Bowel sounds are normal. There is no distension.     Palpations: Abdomen is soft. There is no mass.     Tenderness: There is no abdominal tenderness. There is no guarding or rebound.  Musculoskeletal:        General: No tenderness. Normal range of motion.     Cervical back: Normal range of motion and neck supple.     Comments: Grade 1 swelling in the legs bilaterally.  Skin:    General: Skin is warm.  Neurological:     Mental Status: He is alert and oriented to person, place, and time.  Psychiatric:        Mood and Affect: Affect normal.      LABORATORY DATA:  I have reviewed the data as listed    Component Value Date/Time   NA 135 12/07/2022 1054   NA 137 06/19/2021 1121   NA 138 12/10/2014 1341   K 3.6 12/07/2022 1054   K 3.6 12/10/2014 1341   CL 98 12/07/2022 1054   CL 102 12/10/2014 1341   CO2 26 12/07/2022 1054   CO2 30 12/10/2014 1341   GLUCOSE 219 (H) 12/07/2022 1054   GLUCOSE 160 (H) 12/10/2014 1341   BUN 20 12/07/2022 1054   BUN 18 06/19/2021 1121   BUN 21 (H) 12/10/2014 1341   CREATININE 1.02 12/07/2022 1054   CREATININE 1.10 03/25/2015 1453   CALCIUM 8.5 (L) 12/07/2022 1054   CALCIUM 8.4 (L) 12/10/2014 1341   PROT 7.1 12/07/2022 1054   PROT 7.3 06/19/2021 1121   PROT 6.6 12/10/2014 1341   ALBUMIN 3.9 12/07/2022 1054   ALBUMIN 4.5 06/19/2021 1121   ALBUMIN 3.4 12/10/2014 1341   AST 30 12/07/2022 1054   AST  17 12/10/2014 1341   ALT 21 12/07/2022 1054   ALT 23 12/10/2014 1341   ALKPHOS 46 12/07/2022 1054   ALKPHOS 50 12/10/2014 1341   BILITOT 0.8 12/07/2022 1054   BILITOT 0.2 06/19/2021 1121   BILITOT 0.5 12/10/2014 1341   GFRNONAA >60 12/07/2022 1054   GFRNONAA >60 03/25/2015 1453   GFRAA >60 08/19/2020 1324   GFRAA >60 03/25/2015 1453    No results found for: "SPEP", "UPEP"  Lab Results  Component Value Date   WBC 4.9 12/07/2022   NEUTROABS 2.8 12/07/2022   HGB 14.5 12/07/2022   HCT 41.6 12/07/2022   MCV 91.6 12/07/2022   PLT 128 (L) 12/07/2022      Chemistry      Component Value Date/Time   NA 135 12/07/2022 1054   NA 137 06/19/2021 1121   NA 138 12/10/2014 1341   K 3.6 12/07/2022 1054   K 3.6 12/10/2014 1341   CL 98 12/07/2022 1054   CL 102 12/10/2014 1341   CO2 26 12/07/2022 1054   CO2 30 12/10/2014 1341   BUN 20 12/07/2022 1054   BUN 18 06/19/2021 1121   BUN 21 (H) 12/10/2014 1341   CREATININE 1.02 12/07/2022 1054   CREATININE 1.10 03/25/2015 1453      Component Value Date/Time   CALCIUM 8.5 (L) 12/07/2022 1054   CALCIUM 8.4 (L) 12/10/2014 1341   ALKPHOS 46 12/07/2022 1054   ALKPHOS 50 12/10/2014 1341   AST 30 12/07/2022 1054   AST 17 12/10/2014 1341   ALT 21 12/07/2022 1054   ALT 23 12/10/2014 1341   BILITOT 0.8 12/07/2022 1054   BILITOT 0.2 06/19/2021 1121   BILITOT 0.5 12/10/2014 1341       RADIOGRAPHIC STUDIES: I have  personally reviewed the radiological images as listed and agreed with the findings in the report. No results found.   ASSESSMENT & PLAN:  Cancer of hilus of left lung ([place]) # Adenocarcinoma the lung; stage IV;  CT AUG 14th 2023- CT No evidence of recurrent or metastatic carcinoma within the chest, abdomen, or pelvis. STABLE  # proceed with Alimta chemotherapy. Labs today reviewed;  acceptable for treatment today.  Platelets 128.  Will plan imaging in February.  # Thoracic aneurysm-approximately 4.5 cm in size; on surveillance;  CT JULY 2023-4.7 cm.  S/p evaluation with 10/17- Dr. Lucky Cowboy.- STABLE.   # Chronic fluid retention-as needed Lasix Aldactone.  No worsening of renal function.- STABLE.   # port malfunction: fibrin sheath [2022] s/p TPA-; dysfunction noted- OK today. STABLE   b12 -12/01; q9W;    # DISPOSITION:  # proceed with Alimta today   # Follow up 4 weeks- Friday MD-labs- cbc/cmp; Alimta; b12 injection- Dr.B     Orders Placed This Encounter  Procedures   CBC with Differential/Platelet    Standing Status:   Future    Standing Expiration Date:   12/08/2023   Comprehensive metabolic panel    Standing Status:   Future    Standing Expiration Date:   12/08/2023   CBC with Differential    Standing Status:   Future    Standing Expiration Date:   01/05/2024   Comprehensive metabolic panel    Standing Status:   Future    Standing Expiration Date:   01/05/2024     All questions were answered. The patient knows to call the clinic with any problems, questions or concerns.      [name], MD 12/07/2022 2:58 PM

## 2022-12-07 NOTE — Patient Instructions (Signed)
[facility] AT [place]  Discharge Instructions: Thank you for choosing [facility] to provide your oncology and hematology care.  If you have a lab appointment with the [facility], please go directly to the [facility] and check in at the registration area.  Wear comfortable clothing and clothing appropriate for easy access to any Portacath or PICC line.   We strive to give you quality time with your provider. You may need to reschedule your appointment if you arrive late (15 or more minutes).  Arriving late affects you and other patients whose appointments are after yours.  Also, if you miss three or more appointments without notifying the office, you may be dismissed from the clinic at the provider's discretion.      For prescription refill requests, have your pharmacy contact our office and allow 72 hours for refills to be completed.    Today you received the following chemotherapy and/or immunotherapy agents- Alimta      To help prevent nausea and vomiting after your treatment, we encourage you to take your nausea medication as directed.  BELOW ARE SYMPTOMS THAT SHOULD BE REPORTED IMMEDIATELY: *FEVER GREATER THAN 100.4 F (38 C) OR HIGHER *CHILLS OR SWEATING *NAUSEA AND VOMITING THAT IS NOT CONTROLLED WITH YOUR NAUSEA MEDICATION *UNUSUAL SHORTNESS OF BREATH *UNUSUAL BRUISING OR BLEEDING *URINARY PROBLEMS (pain or burning when urinating, or frequent urination) *BOWEL PROBLEMS (unusual diarrhea, constipation, pain near the anus) TENDERNESS IN MOUTH AND THROAT WITH OR WITHOUT PRESENCE OF ULCERS (sore throat, sores in mouth, or a toothache) UNUSUAL RASH, SWELLING OR PAIN  UNUSUAL VAGINAL DISCHARGE OR ITCHING   Items with * indicate a potential emergency and should be followed up as soon as possible or go to the Emergency Department if any problems should occur.  Please show the CHEMOTHERAPY ALERT CARD or IMMUNOTHERAPY ALERT CARD at check-in to the  Emergency Department and triage nurse.  Should you have questions after your visit or need to cancel or reschedule your appointment, please contact [facility] CANCER [place] AT [place]  [phone] and follow the prompts.  Office hours are 8:00 a.m. to 4:30 p.m. Monday - Friday. Please note that voicemails left after 4:00 p.m. may not be returned until the following business day.  We are closed weekends and major holidays. You have access to a nurse at all times for urgent questions. Please call the main number to the clinic [phone] and follow the prompts.  For any non-urgent questions, you may also contact your provider using MyChart. We now offer e-Visits for anyone 43 and older to request care online for non-urgent symptoms. For details visit [email or web address].   Also download the MyChart app! Go to the app store, search "MyChart", open the app, select [facility], and log in with your MyChart username and password.

## 2022-12-28 ENCOUNTER — Ambulatory Visit: Payer: BC Managed Care – PPO

## 2022-12-28 ENCOUNTER — Other Ambulatory Visit: Payer: BC Managed Care – PPO

## 2022-12-28 ENCOUNTER — Ambulatory Visit: Payer: BC Managed Care – PPO | Admitting: Internal Medicine

## 2023-01-04 ENCOUNTER — Inpatient Hospital Stay (HOSPITAL_BASED_OUTPATIENT_CLINIC_OR_DEPARTMENT_OTHER): Payer: BC Managed Care – PPO | Admitting: Internal Medicine

## 2023-01-04 ENCOUNTER — Encounter: Payer: Self-pay | Admitting: Internal Medicine

## 2023-01-04 ENCOUNTER — Inpatient Hospital Stay: Payer: BC Managed Care – PPO

## 2023-01-04 ENCOUNTER — Inpatient Hospital Stay: Payer: BC Managed Care – PPO | Attending: Internal Medicine

## 2023-01-04 DIAGNOSIS — Z5111 Encounter for antineoplastic chemotherapy: Secondary | ICD-10-CM

## 2023-01-04 DIAGNOSIS — C3402 Malignant neoplasm of left main bronchus: Secondary | ICD-10-CM | POA: Diagnosis present

## 2023-01-04 DIAGNOSIS — M2550 Pain in unspecified joint: Secondary | ICD-10-CM | POA: Diagnosis not present

## 2023-01-04 DIAGNOSIS — R252 Cramp and spasm: Secondary | ICD-10-CM | POA: Insufficient documentation

## 2023-01-04 DIAGNOSIS — E538 Deficiency of other specified B group vitamins: Secondary | ICD-10-CM | POA: Diagnosis present

## 2023-01-04 DIAGNOSIS — I712 Thoracic aortic aneurysm, without rupture, unspecified: Secondary | ICD-10-CM | POA: Diagnosis not present

## 2023-01-04 DIAGNOSIS — R109 Unspecified abdominal pain: Secondary | ICD-10-CM | POA: Insufficient documentation

## 2023-01-04 DIAGNOSIS — R609 Edema, unspecified: Secondary | ICD-10-CM | POA: Insufficient documentation

## 2023-01-04 DIAGNOSIS — Z79899 Other long term (current) drug therapy: Secondary | ICD-10-CM | POA: Diagnosis not present

## 2023-01-04 LAB — CBC WITH DIFFERENTIAL/PLATELET
Abs Immature Granulocytes: 0.02 10*3/uL (ref 0.00–0.07)
Basophils Absolute: 0 10*3/uL (ref 0.0–0.1)
Basophils Relative: 1 %
Eosinophils Absolute: 0 10*3/uL (ref 0.0–0.5)
Eosinophils Relative: 1 %
HCT: 41.9 % (ref 39.0–52.0)
Hemoglobin: 15 g/dL (ref 13.0–17.0)
Immature Granulocytes: 0 %
Lymphocytes Relative: 25 %
Lymphs Abs: 1.5 10*3/uL (ref 0.7–4.0)
MCH: 32.3 pg (ref 26.0–34.0)
MCHC: 35.8 g/dL (ref 30.0–36.0)
MCV: 90.3 fL (ref 80.0–100.0)
Monocytes Absolute: 0.5 10*3/uL (ref 0.1–1.0)
Monocytes Relative: 9 %
Neutro Abs: 4 10*3/uL (ref 1.7–7.7)
Neutrophils Relative %: 64 %
Platelets: 159 10*3/uL (ref 150–400)
RBC: 4.64 MIL/uL (ref 4.22–5.81)
RDW: 12.5 % (ref 11.5–15.5)
WBC: 6.1 10*3/uL (ref 4.0–10.5)
nRBC: 0 % (ref 0.0–0.2)

## 2023-01-04 LAB — COMPREHENSIVE METABOLIC PANEL
ALT: 21 U/L (ref 0–44)
AST: 29 U/L (ref 15–41)
Albumin: 4.4 g/dL (ref 3.5–5.0)
Alkaline Phosphatase: 48 U/L (ref 38–126)
Anion gap: 10 (ref 5–15)
BUN: 22 mg/dL — ABNORMAL HIGH (ref 6–20)
CO2: 26 mmol/L (ref 22–32)
Calcium: 9.3 mg/dL (ref 8.9–10.3)
Chloride: 98 mmol/L (ref 98–111)
Creatinine, Ser: 1.13 mg/dL (ref 0.61–1.24)
GFR, Estimated: >60 mL/min (ref >60)
Glucose, Bld: 119 mg/dL — ABNORMAL HIGH (ref 70–99)
Potassium: 3.6 mmol/L (ref 3.5–5.1)
Sodium: 134 mmol/L — ABNORMAL LOW (ref 135–145)
Total Bilirubin: 0.7 mg/dL (ref 0.3–1.2)
Total Protein: 8 g/dL (ref 6.5–8.1)

## 2023-01-04 MED ORDER — HEPARIN SOD (PORK) LOCK FLUSH 100 UNIT/ML IV SOLN
500.0000 [IU] | Freq: Once | INTRAVENOUS | Status: AC
  Administered 2023-01-04: 500 [IU] via INTRAVENOUS
  Filled 2023-01-04: qty 5

## 2023-01-04 MED ORDER — TRIAMCINOLONE ACETONIDE 0.5 % EX OINT: 1.0000 | TOPICAL_OINTMENT | Freq: Every day | CUTANEOUS | 0 refills | Status: DC

## 2023-01-04 NOTE — Assessment & Plan Note (Addendum)
#   Adenocarcinoma the lung; stage IV;  CT AUG 14th 2023- CT No evidence of recurrent or metastatic carcinoma within the chest, abdomen, or pelvis. Stable.  # I had long discussion the patient regarding his multitude of complaints including abdominal cramps/leg cramps/worsening arthralgias/bilateral palm and soles itching [see below]-likely secondary to ongoing Alimta chemotherapy.  Patient has been on therapy for the last 12 years.  Most recent CAT scan July 16, 2022 NED.  Recommend a CT scan approximately month.  If CAT scan is negative for any recurrent disease-I would recommend withholding further therapy at this time.  I would recommend surveillance imaging every 3 months so.  Patient is in agreement.  # Hold chemotherapy today.  Otherwise labs adequate.   #Bilateral soles/palms- itches-likely secondary to Alimta.  Recommend kenalog.   # Thoracic aneurysm-approximately 4.5 cm in size; on surveillance; CT JULY 2023-4.7 cm.  S/p evaluation with 10/17- Dr. Lucky Cowboy.- Stable.   # Chronic fluid retention-as needed Lasix Aldactone.  No worsening of renal function.- Stable.   # port malfunction: fibrin sheath [2022] s/p TPA-; dysfunction noted- OK today. Stable.   b12 -12/01; q9W;    # DISPOSITION:  # HOLD Alimta today; HOLD B12 injection; de-access    # Follow up 4 weeks- Friday MD-labs- cbc/cmp; Magnesium; Alimta; b12 injection; CT CAP prior-- Dr.B

## 2023-01-04 NOTE — Progress Notes (Signed)
Increasing joint pain with activity.  Itching on bottom of feet getting worse.    Right arm/hand will go numb after sitting for a while.  New bilateral cramping in calves  When hands feel cold he will have pain that is relieved with warmth applied to hands.

## 2023-01-04 NOTE — Progress Notes (Signed)
[place] OFFICE PROGRESS NOTE  Patient Care Team: Mitchell Post, Mosley as PCP - General (Family Medicine) Mitchell Sickle, Mosley as Consulting Physician (Internal Medicine)   Cancer Staging  No matching staging information was found for the patient.   Oncology History Overview Note  # 2012- METASTATIC ADENO CA of LEFT LUNG [acinar pattern] s/p MED LN Bx [NEG- EGFR/K-ras/ALK mutation];[name]; [name]; CT JULY  2016- NED; JAN 2018- NED;   # AUG 2020-clinical trial closed; continue maintenance Alimta  # Thoracic aortic aneurysm-Mosley.0 cm in 2016; currently Mosley.6 cm.  s/p referral to Dr. Faith Mosley.; MRI in 6 months planned.  ----------------------------------------------------------    DIAGNOSIS: Mitchell Mosley       ;GOALS: Palliative  CURRENT/MOST RECENT THERAPY [ ]  Alimta maintenance    Cancer of hilus of left lung ([place])  07/20/2016 Initial Diagnosis   Cancer of hilus of left lung ([place])   12/11/2019 - 06/29/2022 Chemotherapy   Patient is on Treatment Plan : LUNG Pemetrexed (Alimta) q21d      12/11/2019 -  Chemotherapy   Patient is on Treatment Plan : LUNG Pemetrexed (500) q21d       INTERVAL HISTORY: Alone.  Ambulating independently.  Mitchell Mosley 62 y.o.  male pleasant patient above history of metastatic adenocarcinoma the lung-NED currently on maintenance Alimta is here for follow-up.   Increasing joint pain with activity.  Itching on bottom of feet getting worse.  Right arm/hand will go numb after sitting for a while.   New bilateral cramping in calves. When hands feel cold he will have pain that is relieved with warmth applied to hands.  No new shortness of breath or cough. Currently  on Lasix/Aldactone for chronic lower extremity swelling.  Review of Systems  Constitutional:  Negative for chills, diaphoresis, fever,  malaise/fatigue and weight loss.  HENT:  Negative for nosebleeds and sore throat.   Eyes:  Negative for double vision.  Respiratory:  Negative for hemoptysis, shortness of breath and wheezing.   Cardiovascular:  Positive for leg swelling. Negative for chest pain, palpitations and orthopnea.  Gastrointestinal:  Negative for abdominal pain, blood in stool, constipation, diarrhea, heartburn, melena, nausea and vomiting.  Genitourinary:  Negative for dysuria, frequency and urgency.  Musculoskeletal:  Positive for joint pain.  Skin: Negative.  Negative for itching and rash.  Neurological:  Negative for dizziness, tingling, focal weakness, weakness and headaches.  Endo/Heme/Allergies:  Does not bruise/bleed easily.  Psychiatric/Behavioral:  Negative for depression. The patient is not nervous/anxious and does not have insomnia.       PAST MEDICAL HISTORY :  Past Medical History:  Diagnosis Date   Allergy    Lung cancer ([place])    Mild valvular heart disease Nov. 2014   per 2 D Echocardiogram done for persistent leg edema, monitored by cardiology   Peripheral edema    Thoracic aortic aneurysm (TAA) ([place])     PAST SURGICAL HISTORY :   Past Surgical History:  Procedure Laterality Date   ELBOW SURGERY Right 2011   IR CV LINE INJECTION  09/11/2021   Left anterior thoracotomy with biopsy  March 2012   PORTACATH PLACEMENT  March 2012   TONSILLECTOMY  as child    FAMILY HISTORY :   Family History  Problem Relation Age of Onset   Leukemia Father    Diabetes Mother     SOCIAL HISTORY:  Social History   Tobacco Use   Smoking status: Never   Smokeless tobacco: Never  Vaping Use   Vaping Use: Never used  Substance Use Topics   Alcohol use: Yes    Alcohol/week: 0.0 standard drinks of alcohol    Comment: "social drinker"    Drug use: No    ALLERGIES:  is allergic to penicillins.  MEDICATIONS:  Current Outpatient Medications  Medication Sig Dispense Refill   fluticasone  (FLONASE) 50 MCG/ACT nasal spray Place 2 sprays into both nostrils daily. 16 g 6   folic acid (FOLVITE) 1 MG tablet TAKE 1 TABLET BY MOUTH EVERY DAY 90 tablet 3   furosemide (LASIX) 20 MG tablet TAKE 1 TABLET (20 MG TOTAL) BY MOUTH DAILY AS NEEDED FOR SWELLING 90 tablet 1   HYDROcodone-acetaminophen (NORCO) 5-325 MG tablet Take 1 tablet by mouth every 6 (six) hours as needed for moderate pain. 30 tablet 0   meloxicam (MOBIC) 15 MG tablet Take 15 mg by mouth daily.     Multiple Vitamin (MULTIVITAMIN) capsule Take 1 capsule by mouth daily. Reported on 02/24/2016     NON FORMULARY Relief Factor with vitamin D and Turmeric     Omega-3 Fatty Acids (FISH OIL) 1000 MG CAPS Take 1 capsule by mouth daily. Reported on 02/24/2016     senna (SENOKOT) 8.6 MG tablet Take 1 tablet by mouth daily. Take as needed around time of chemotherapy     sildenafil (VIAGRA) 100 MG tablet TAKE 1 TABLET BY MOUTH EVERY DAY AS NEEDED FOR ERECTILE DYSFUNCTION 15 tablet 11   spironolactone (ALDACTONE) 25 MG tablet Take 1 tablet (25 mg total) by mouth daily. 90 tablet 3   tadalafil (CIALIS) 20 MG tablet Take one tablet every 2 days as needed 50 tablet 5   triamcinolone ointment (KENALOG) 0.5 % Apply 1 Application topically at bedtime. 30 g 0   vitamin B-12 (CYANOCOBALAMIN) 1000 MCG tablet Take 1,000 mcg by mouth daily.     No current facility-administered medications for this visit.   Facility-Administered Medications Ordered in Other Visits  Medication Dose Route Frequency Provider Last Rate Last Admin   heparin lock flush 100 unit/mL  500 Units Intravenous Once Mitchell Mosley       heparin lock flush 100 unit/mL  500 Units Intravenous Once Mitchell Mosley       sodium chloride 0.9 % injection 10 mL  10 mL Intracatheter PRN Mitchell Mosley   10 mL at 05/27/15 1543   sodium chloride 0.9 % injection 10 mL  10 mL Intracatheter PRN Mitchell Mosley   10 mL at 06/17/15 1510   sodium chloride 0.9 %  injection 10 mL  10 mL Intravenous PRN Mitchell Mosley   10 mL at 07/08/15 1340   sodium chloride flush (NS) 0.9 % injection 10 mL  10 mL Intravenous PRN Mitchell Mosley   10 mL at 09/30/20 1412    PHYSICAL EXAMINATION: ECOG PERFORMANCE STATUS: 0 - Asymptomatic  BP (!) 135/90 (BP Location: Left Arm, Patient Position: Sitting)   Pulse (!) 105   Temp 98.1 F (36.7 C) (Tympanic)   Resp 18   Ht 5\' 10"  (1.778 m)   Wt 214 lb 9.6 oz (97.3 kg)   BMI 30.79 kg/m   Filed Weights   01/04/23 1300  Weight: 214 lb 9.6 oz (97.3 kg)      Physical Exam HENT:     Head: Normocephalic and atraumatic.  Mouth/Throat:     Pharynx: No oropharyngeal exudate.  Eyes:     Pupils: Pupils are equal, round, and reactive to light.  Cardiovascular:     Rate and Rhythm: Normal rate and regular rhythm.  Pulmonary:     Effort: No respiratory distress.     Breath sounds: No wheezing.  Abdominal:     General: Bowel sounds are normal. There is no distension.     Palpations: Abdomen is soft. There is no mass.     Tenderness: There is no abdominal tenderness. There is no guarding or rebound.  Musculoskeletal:        General: No tenderness. Normal range of motion.     Cervical back: Normal range of motion and neck supple.     Comments: Grade 1 swelling in the legs bilaterally.  Skin:    General: Skin is warm.  Neurological:     Mental Status: He is alert and oriented to person, place, and time.  Psychiatric:        Mood and Affect: Affect normal.      LABORATORY DATA:  I have reviewed the data as listed    Component Value Date/Time   NA 134 (L) 01/04/2023 1252   NA 137 06/19/2021 1121   NA 138 12/10/2014 1341   K 3.6 01/04/2023 1252   K 3.6 12/10/2014 1341   CL 98 01/04/2023 1252   CL 102 12/10/2014 1341   CO2 26 01/04/2023 1252   CO2 30 12/10/2014 1341   GLUCOSE 119 (H) 01/04/2023 1252   GLUCOSE 160 (H) 12/10/2014 1341   BUN 22 (H) 01/04/2023 1252   BUN 18 06/19/2021 1121   BUN  21 (H) 12/10/2014 1341   CREATININE 1.13 01/04/2023 1252   CREATININE 1.10 03/25/2015 1453   CALCIUM 9.3 01/04/2023 1252   CALCIUM 8.Mosley (L) 12/10/2014 1341   PROT 8.0 01/04/2023 1252   PROT 7.3 06/19/2021 1121   PROT 6.6 12/10/2014 1341   ALBUMIN Mosley.Mosley 01/04/2023 1252   ALBUMIN Mosley.5 06/19/2021 1121   ALBUMIN 3.Mosley 12/10/2014 1341   AST 29 01/04/2023 1252   AST 17 12/10/2014 1341   ALT 21 01/04/2023 1252   ALT 23 12/10/2014 1341   ALKPHOS 48 01/04/2023 1252   ALKPHOS 50 12/10/2014 1341   BILITOT 0.7 01/04/2023 1252   BILITOT 0.2 06/19/2021 1121   BILITOT 0.5 12/10/2014 1341   GFRNONAA >60 01/04/2023 1252   GFRNONAA >60 03/25/2015 1453   GFRAA >60 08/19/2020 1324   GFRAA >60 03/25/2015 1453    No results found for: "SPEP", "UPEP"  Lab Results  Component Value Date   WBC 6.1 01/04/2023   NEUTROABS Mosley.0 01/04/2023   HGB 15.0 01/04/2023   HCT 41.9 01/04/2023   MCV 90.3 01/04/2023   PLT 159 01/04/2023      Chemistry      Component Value Date/Time   NA 134 (L) 01/04/2023 1252   NA 137 06/19/2021 1121   NA 138 12/10/2014 1341   K 3.6 01/04/2023 1252   K 3.6 12/10/2014 1341   CL 98 01/04/2023 1252   CL 102 12/10/2014 1341   CO2 26 01/04/2023 1252   CO2 30 12/10/2014 1341   BUN 22 (H) 01/04/2023 1252   BUN 18 06/19/2021 1121   BUN 21 (H) 12/10/2014 1341   CREATININE 1.13 01/04/2023 1252   CREATININE 1.10 03/25/2015 1453      Component Value Date/Time   CALCIUM 9.3 01/04/2023 1252   CALCIUM 8.Mosley (L) 12/10/2014 1341  ALKPHOS 48 01/04/2023 1252   ALKPHOS 50 12/10/2014 1341   AST 29 01/04/2023 1252   AST 17 12/10/2014 1341   ALT 21 01/04/2023 1252   ALT 23 12/10/2014 1341   BILITOT 0.7 01/04/2023 1252   BILITOT 0.2 06/19/2021 1121   BILITOT 0.5 12/10/2014 1341       RADIOGRAPHIC STUDIES: I have personally reviewed the radiological images as listed and agreed with the findings in the report. No results found.   ASSESSMENT & PLAN:  Cancer of hilus of left lung  ([place]) # Adenocarcinoma the lung; stage IV;  CT AUG 14th 2023- CT No evidence of recurrent or metastatic carcinoma within the chest, abdomen, or pelvis. Stable.  # I had long discussion the patient regarding his multitude of complaints including abdominal cramps/leg cramps/worsening arthralgias/bilateral palm and soles itching [see below]-likely secondary to ongoing Alimta chemotherapy.  Patient has been on therapy for the last 12 years.  Most recent CAT scan July 16, 2022 NED.  Recommend a CT scan approximately month.  If CAT scan is negative for any recurrent disease-I would recommend withholding further therapy at this time.  I would recommend surveillance imaging every 3 months so.  Patient is in agreement.  # Hold chemotherapy today.  Otherwise labs adequate.   #Bilateral soles/palms- itches-likely secondary to Alimta.  Recommend kenalog.   # Thoracic aneurysm-approximately Mosley.5 cm in size; on surveillance; CT JULY 2023-Mosley.7 cm.  S/p evaluation with 10/17- Dr. Lucky Cowboy.- Stable.   # Chronic fluid retention-as needed Lasix Aldactone.  No worsening of renal function.- Stable.   # port malfunction: fibrin sheath [2022] s/p TPA-; dysfunction noted- OK today. Stable.   b12 -12/01; q9W;    # DISPOSITION:  # HOLD Alimta today; HOLD B12 injection; de-access    # Follow up Mosley weeks- Friday Mosley-labs- cbc/cmp; Magnesium; Alimta; b12 injection; CT CAP prior-- Dr.B     Orders Placed This Encounter  Procedures   CT CHEST ABDOMEN PELVIS W CONTRAST    Standing Status:   Future    Standing Expiration Date:   01/05/2024    Order Specific Question:   Preferred imaging location?    Answer:   [facility]    Order Specific Question:   Radiology Contrast Protocol - do NOT remove file path    Answer:   [email or web address]     All questions were answered. The patient knows to call the clinic with any problems, questions or concerns.      Mitchell Sickle,  Mosley 01/04/2023 1:46 PM

## 2023-01-28 ENCOUNTER — Ambulatory Visit
Admission: RE | Admit: 2023-01-28 | Discharge: 2023-01-28 | Disposition: A | Discharge status: Home / Self Care | Payer: BC Managed Care – PPO | Source: Ambulatory Visit | Attending: Internal Medicine | Admitting: Internal Medicine

## 2023-01-28 DIAGNOSIS — C3402 Malignant neoplasm of left main bronchus: Secondary | ICD-10-CM | POA: Insufficient documentation

## 2023-01-28 MED ORDER — IOHEXOL 300 MG/ML  SOLN
100.0000 mL | Freq: Once | INTRAMUSCULAR | Status: AC | PRN
  Administered 2023-01-28: 100 mL via INTRAVENOUS

## 2023-02-01 ENCOUNTER — Inpatient Hospital Stay: Payer: BC Managed Care – PPO

## 2023-02-01 ENCOUNTER — Encounter: Payer: Self-pay | Admitting: Internal Medicine

## 2023-02-01 ENCOUNTER — Ambulatory Visit: Payer: BC Managed Care – PPO

## 2023-02-01 ENCOUNTER — Other Ambulatory Visit: Payer: BC Managed Care – PPO

## 2023-02-01 ENCOUNTER — Ambulatory Visit: Payer: BC Managed Care – PPO | Admitting: Internal Medicine

## 2023-02-01 ENCOUNTER — Other Ambulatory Visit: Payer: Self-pay | Admitting: Internal Medicine

## 2023-02-01 ENCOUNTER — Inpatient Hospital Stay: Payer: BC Managed Care – PPO | Attending: Internal Medicine

## 2023-02-01 ENCOUNTER — Inpatient Hospital Stay (HOSPITAL_BASED_OUTPATIENT_CLINIC_OR_DEPARTMENT_OTHER): Payer: BC Managed Care – PPO | Admitting: Internal Medicine

## 2023-02-01 VITALS — BP 137/91 | HR 81 | Temp 96.9°F | Resp 18 | Wt 211.9 lb

## 2023-02-01 DIAGNOSIS — E538 Deficiency of other specified B group vitamins: Secondary | ICD-10-CM | POA: Insufficient documentation

## 2023-02-01 DIAGNOSIS — I712 Thoracic aortic aneurysm, without rupture, unspecified: Secondary | ICD-10-CM | POA: Insufficient documentation

## 2023-02-01 DIAGNOSIS — C3402 Malignant neoplasm of left main bronchus: Secondary | ICD-10-CM

## 2023-02-01 DIAGNOSIS — Z5111 Encounter for antineoplastic chemotherapy: Secondary | ICD-10-CM | POA: Diagnosis not present

## 2023-02-01 DIAGNOSIS — K760 Fatty (change of) liver, not elsewhere classified: Secondary | ICD-10-CM | POA: Diagnosis not present

## 2023-02-01 DIAGNOSIS — M2550 Pain in unspecified joint: Secondary | ICD-10-CM | POA: Insufficient documentation

## 2023-02-01 DIAGNOSIS — Z79899 Other long term (current) drug therapy: Secondary | ICD-10-CM | POA: Diagnosis not present

## 2023-02-01 DIAGNOSIS — R609 Edema, unspecified: Secondary | ICD-10-CM | POA: Insufficient documentation

## 2023-02-01 LAB — CBC WITH DIFFERENTIAL/PLATELET
Abs Immature Granulocytes: 0.02 10*3/uL (ref 0.00–0.07)
Basophils Absolute: 0.1 10*3/uL (ref 0.0–0.1)
Basophils Relative: 1 %
Eosinophils Absolute: 0.1 10*3/uL (ref 0.0–0.5)
Eosinophils Relative: 2 %
HCT: 45.4 % (ref 39.0–52.0)
Hemoglobin: 15.1 g/dL (ref 13.0–17.0)
Immature Granulocytes: 0 %
Lymphocytes Relative: 30 %
Lymphs Abs: 1.7 10*3/uL (ref 0.7–4.0)
MCH: 31.6 pg (ref 26.0–34.0)
MCHC: 33.3 g/dL (ref 30.0–36.0)
MCV: 95 fL (ref 80.0–100.0)
Monocytes Absolute: 0.5 10*3/uL (ref 0.1–1.0)
Monocytes Relative: 10 %
Neutro Abs: 3.1 10*3/uL (ref 1.7–7.7)
Neutrophils Relative %: 57 %
Platelets: 147 10*3/uL — ABNORMAL LOW (ref 150–400)
RBC: 4.78 MIL/uL (ref 4.22–5.81)
RDW: 12.3 % (ref 11.5–15.5)
WBC: 5.5 10*3/uL (ref 4.0–10.5)
nRBC: 0 % (ref 0.0–0.2)

## 2023-02-01 LAB — COMPREHENSIVE METABOLIC PANEL
ALT: 22 U/L (ref 0–44)
AST: 27 U/L (ref 15–41)
Albumin: 4.1 g/dL (ref 3.5–5.0)
Alkaline Phosphatase: 37 U/L — ABNORMAL LOW (ref 38–126)
Anion gap: 9 (ref 5–15)
BUN: 23 mg/dL — ABNORMAL HIGH (ref 6–20)
CO2: 26 mmol/L (ref 22–32)
Calcium: 9.4 mg/dL (ref 8.9–10.3)
Chloride: 96 mmol/L — ABNORMAL LOW (ref 98–111)
Creatinine, Ser: 1.12 mg/dL (ref 0.61–1.24)
GFR, Estimated: >60 mL/min (ref >60)
Glucose, Bld: 140 mg/dL — ABNORMAL HIGH (ref 70–99)
Potassium: 3.9 mmol/L (ref 3.5–5.1)
Sodium: 131 mmol/L — ABNORMAL LOW (ref 135–145)
Total Bilirubin: 0.6 mg/dL (ref 0.3–1.2)
Total Protein: 7.4 g/dL (ref 6.5–8.1)

## 2023-02-01 LAB — MAGNESIUM: Magnesium: 2 mg/dL (ref 1.7–2.4)

## 2023-02-01 NOTE — Progress Notes (Signed)
Patient denies new problems/concerns today.   Feeling much better than last visit.

## 2023-02-01 NOTE — Assessment & Plan Note (Addendum)
#   Adenocarcinoma the lung; stage IV;  CT CAP- FEB 24th, 2024-  No evidence of metastatic disease.  Stable right lung nodules noted 2 to 3 mm in size however one small right lower lobe pulmonary nodule measuring 4 mm, slightly increased in size from 3 mm on the prior.  Other lung nodules stable.   # I reviewed the above findings with the patient in detail.  Discussed that repeat scan in 3 months would be reasonable to assess/monitor. Patient is in agreement.   # Continue to hold chemotherapy today.  Otherwise labs adequate.   # march 2024-CT scan incidental fatty liver infiltration-discussed with the patient; counseled the patient re: diet.   # Thoracic aneurysm-approximately 4.5 cm in size; on surveillance; CT FEB 2024-4.7 cm.  S/p evaluation with 09/18/2022 Dr. Lucky Cowboy.- stable; continue every 6 months imaging.Stable.   # Chronic fluid retention-as needed Lasix Aldactone.  No worsening of renal function.- Stable.   # port malfunction: fibrin sheath [2022] s/p TPA-; dysfunction noted- OK today. Stable.   b12 -12/01; q9W;    # DISPOSITION:  # HOLD Alimta today; HOLD B12 injection  # Follow up 3 months-  Friday MD-port flush- labs- cbc/cmp; Magnesium; possible Alimta; b12 injection; CT chest prior-- Dr.B  # I reviewed the blood work- with the patient in detail; also reviewed the imaging independently [as summarized above]; and with the patient in detail.

## 2023-02-01 NOTE — Progress Notes (Signed)
[place] OFFICE PROGRESS NOTE  Patient Care Team: Eulas Post, MD as PCP - General (Family Medicine) [name], MD as Consulting Physician (Internal Medicine)   Cancer Staging  No matching staging information was found for the patient.   Oncology History Overview Note  # 2012- METASTATIC ADENO CA of LEFT LUNG [acinar pattern] s/p MED LN Bx [NEG- EGFR/K-ras/ALK mutation];[name]; [name]; CT JULY  2016- NED; JAN 2018- NED;   # AUG 2020-clinical trial closed; continue maintenance Alimta; HELD JAN 2024- [leg cramps/leg swelling/worsening arthralgias/bilateral palm and soles itching].   # Thoracic aortic aneurysm-4.0 cm in 2016; currently 4.6 cm.  s/p referral to Dr. Faith Rogue.; MRI in 6 months planned.  ----------------------------------------------------------    DIAGNOSIS: [name]  STAGE: 4       ;GOALS: Palliative  CURRENT/MOST RECENT THERAPY '[ ]'$  Alimta maintenance    Cancer of hilus of left lung ([place])  07/20/2016 Initial Diagnosis   Cancer of hilus of left lung ([place])   12/11/2019 - 06/29/2022 Chemotherapy   Patient is on Treatment Plan : LUNG Pemetrexed (Alimta) q21d      12/11/2019 -  Chemotherapy   Patient is on Treatment Plan : LUNG Pemetrexed (500) q21d       INTERVAL HISTORY: Alone.  Ambulating independently.  [name] 62 y.o.  male pleasant patient above history of metastatic adenocarcinoma the lung-NED currently on maintenance Alimta [HELD JAN 2024] is here for follow-up/reviews of the CT scan.  Alimta was held 1 month ago-ongoing complaints-of cramping in the legs swelling of the legs joint pain L.  Patient notes improvement of his joint pains.  And also cramping in the legs is better.  No new shortness of breath or cough. Currently  on Lasix/Aldactone for chronic lower extremity swelling.  Review of Systems   Constitutional:  Negative for chills, diaphoresis, fever, malaise/fatigue and weight loss.  HENT:  Negative for nosebleeds and sore throat.   Eyes:  Negative for double vision.  Respiratory:  Negative for hemoptysis, shortness of breath and wheezing.   Cardiovascular:  Positive for leg swelling. Negative for chest pain, palpitations and orthopnea.  Gastrointestinal:  Negative for abdominal pain, blood in stool, constipation, diarrhea, heartburn, melena, nausea and vomiting.  Genitourinary:  Negative for dysuria, frequency and urgency.  Musculoskeletal:  Positive for joint pain.  Skin: Negative.  Negative for itching and rash.  Neurological:  Negative for dizziness, tingling, focal weakness, weakness and headaches.  Endo/Heme/Allergies:  Does not bruise/bleed easily.  Psychiatric/Behavioral:  Negative for depression. The patient is not nervous/anxious and does not have insomnia.       PAST MEDICAL HISTORY :  Past Medical History:  Diagnosis Date   Allergy    Lung cancer ([place])    Mild valvular heart disease Nov. 2014   per 2 D Echocardiogram done for persistent leg edema, monitored by cardiology   Peripheral edema    Thoracic aortic aneurysm (TAA) ([place])     PAST SURGICAL HISTORY :   Past Surgical History:  Procedure Laterality Date   ELBOW SURGERY Right 2011   IR CV LINE INJECTION  09/11/2021   Left anterior thoracotomy with biopsy  March 2012   PORTACATH PLACEMENT  March 2012   TONSILLECTOMY  as child    FAMILY HISTORY :   Family History  Problem Relation Age of Onset   Leukemia Father    Diabetes Mother  SOCIAL HISTORY:   Social History   Tobacco Use   Smoking status: Never   Smokeless tobacco: Never  Vaping Use   Vaping Use: Never used  Substance Use Topics   Alcohol use: Yes    Alcohol/week: 0.0 standard drinks of alcohol    Comment: "social drinker"    Drug use: No    ALLERGIES:  is allergic to penicillins.  MEDICATIONS:  Current Outpatient  Medications  Medication Sig Dispense Refill   fluticasone (FLONASE) 50 MCG/ACT nasal spray Place 2 sprays into both nostrils daily. 16 g 6   folic acid (FOLVITE) 1 MG tablet TAKE 1 TABLET BY MOUTH EVERY DAY 90 tablet 3   furosemide (LASIX) 20 MG tablet TAKE 1 TABLET (20 MG TOTAL) BY MOUTH DAILY AS NEEDED FOR SWELLING 90 tablet 1   HYDROcodone-acetaminophen (NORCO) 5-325 MG tablet Take 1 tablet by mouth every 6 (six) hours as needed for moderate pain. 30 tablet 0   meloxicam (MOBIC) 15 MG tablet Take 15 mg by mouth daily.     Multiple Vitamin (MULTIVITAMIN) capsule Take 1 capsule by mouth daily. Reported on 02/24/2016     NON FORMULARY Relief Factor with vitamin D and Turmeric     Omega-3 Fatty Acids (FISH OIL) 1000 MG CAPS Take 1 capsule by mouth daily. Reported on 02/24/2016     senna (SENOKOT) 8.6 MG tablet Take 1 tablet by mouth daily. Take as needed around time of chemotherapy     sildenafil (VIAGRA) 100 MG tablet TAKE 1 TABLET BY MOUTH EVERY DAY AS NEEDED FOR ERECTILE DYSFUNCTION 15 tablet 11   spironolactone (ALDACTONE) 25 MG tablet Take 1 tablet (25 mg total) by mouth daily. 90 tablet 3   tadalafil (CIALIS) 20 MG tablet Take one tablet every 2 days as needed 50 tablet 5   triamcinolone ointment (KENALOG) 0.5 % Apply 1 Application topically at bedtime. 30 g 0   vitamin B-12 (CYANOCOBALAMIN) 1000 MCG tablet Take 1,000 mcg by mouth daily.     No current facility-administered medications for this visit.   Facility-Administered Medications Ordered in Other Visits  Medication Dose Route Frequency Provider Last Rate Last Admin   heparin lock flush 100 unit/mL  500 Units Intravenous Once Charlaine Dalton R, MD       sodium chloride 0.9 % injection 10 mL  10 mL Intracatheter PRN [name], MD   10 mL at 05/27/15 1543   sodium chloride 0.9 % injection 10 mL  10 mL Intracatheter PRN [name], MD   10 mL at 06/17/15 1510   sodium chloride 0.9 % injection 10 mL  10 mL Intravenous PRN  [name], MD   10 mL at 07/08/15 1340   sodium chloride flush (NS) 0.9 % injection 10 mL  10 mL Intravenous PRN Verlon Au, NP   10 mL at 09/30/20 1412    PHYSICAL EXAMINATION: ECOG PERFORMANCE STATUS: 0 - Asymptomatic  BP (!) 137/91 (BP Location: Left Arm, Patient Position: Sitting)   Pulse 81   Temp (!) 96.9 F (36.1 C) (Tympanic)   Resp 18   Wt 211 lb 14.4 oz (96.1 kg)   BMI 30.40 kg/m   Filed Weights   02/01/23 1100  Weight: 211 lb 14.4 oz (96.1 kg)      Physical Exam HENT:     Head: Normocephalic and atraumatic.     Mouth/Throat:     Pharynx: No oropharyngeal exudate.  Eyes:     Pupils: Pupils are equal, round, and reactive  to light.  Cardiovascular:     Rate and Rhythm: Normal rate and regular rhythm.  Pulmonary:     Effort: No respiratory distress.     Breath sounds: No wheezing.  Abdominal:     General: Bowel sounds are normal. There is no distension.     Palpations: Abdomen is soft. There is no mass.     Tenderness: There is no abdominal tenderness. There is no guarding or rebound.  Musculoskeletal:        General: No tenderness. Normal range of motion.     Cervical back: Normal range of motion and neck supple.     Comments: Grade 1 swelling in the legs bilaterally.  Skin:    General: Skin is warm.  Neurological:     Mental Status: He is alert and oriented to person, place, and time.  Psychiatric:        Mood and Affect: Affect normal.      LABORATORY DATA:  I have reviewed the data as listed    Component Value Date/Time   NA 131 (L) 02/01/2023 1128   NA 137 06/19/2021 1121   NA 138 12/10/2014 1341   K 3.9 02/01/2023 1128   K 3.6 12/10/2014 1341   CL 96 (L) 02/01/2023 1128   CL 102 12/10/2014 1341   CO2 26 02/01/2023 1128   CO2 30 12/10/2014 1341   GLUCOSE 140 (H) 02/01/2023 1128   GLUCOSE 160 (H) 12/10/2014 1341   BUN 23 (H) 02/01/2023 1128   BUN 18 06/19/2021 1121   BUN 21 (H) 12/10/2014 1341   CREATININE 1.12 02/01/2023 1128    CREATININE 1.10 03/25/2015 1453   CALCIUM 9.4 02/01/2023 1128   CALCIUM 8.4 (L) 12/10/2014 1341   PROT 7.4 02/01/2023 1128   PROT 7.3 06/19/2021 1121   PROT 6.6 12/10/2014 1341   ALBUMIN 4.1 02/01/2023 1128   ALBUMIN 4.5 06/19/2021 1121   ALBUMIN 3.4 12/10/2014 1341   AST 27 02/01/2023 1128   AST 17 12/10/2014 1341   ALT 22 02/01/2023 1128   ALT 23 12/10/2014 1341   ALKPHOS 37 (L) 02/01/2023 1128   ALKPHOS 50 12/10/2014 1341   BILITOT 0.6 02/01/2023 1128   BILITOT 0.2 06/19/2021 1121   BILITOT 0.5 12/10/2014 1341   GFRNONAA >60 02/01/2023 1128   GFRNONAA >60 03/25/2015 1453   GFRAA >60 08/19/2020 1324   GFRAA >60 03/25/2015 1453    No results found for: "SPEP", "UPEP"  Lab Results  Component Value Date   WBC 5.5 02/01/2023   NEUTROABS 3.1 02/01/2023   HGB 15.1 02/01/2023   HCT 45.4 02/01/2023   MCV 95.0 02/01/2023   PLT 147 (L) 02/01/2023      Chemistry      Component Value Date/Time   NA 131 (L) 02/01/2023 1128   NA 137 06/19/2021 1121   NA 138 12/10/2014 1341   K 3.9 02/01/2023 1128   K 3.6 12/10/2014 1341   CL 96 (L) 02/01/2023 1128   CL 102 12/10/2014 1341   CO2 26 02/01/2023 1128   CO2 30 12/10/2014 1341   BUN 23 (H) 02/01/2023 1128   BUN 18 06/19/2021 1121   BUN 21 (H) 12/10/2014 1341   CREATININE 1.12 02/01/2023 1128   CREATININE 1.10 03/25/2015 1453      Component Value Date/Time   CALCIUM 9.4 02/01/2023 1128   CALCIUM 8.4 (L) 12/10/2014 1341   ALKPHOS 37 (L) 02/01/2023 1128   ALKPHOS 50 12/10/2014 1341   AST 27 02/01/2023 1128  AST 17 12/10/2014 1341   ALT 22 02/01/2023 1128   ALT 23 12/10/2014 1341   BILITOT 0.6 02/01/2023 1128   BILITOT 0.2 06/19/2021 1121   BILITOT 0.5 12/10/2014 1341       RADIOGRAPHIC STUDIES: I have personally reviewed the radiological images as listed and agreed with the findings in the report. No results found.   ASSESSMENT & PLAN:  Cancer of hilus of left lung ([place]) # Adenocarcinoma the lung; stage IV;   CT CAP- FEB 24th, 2024-  No evidence of metastatic disease.  Stable right lung nodules noted 2 to 3 mm in size however one small right lower lobe pulmonary nodule measuring 4 mm, slightly increased in size from 3 mm on the prior.  Other lung nodules stable.   # I reviewed the above findings with the patient in detail.  Discussed that repeat scan in 3 months would be reasonable to assess/monitor. Patient is in agreement.   # Continue to hold chemotherapy today.  Otherwise labs adequate.   # march 2024-CT scan incidental fatty liver infiltration-discussed with the patient; counseled the patient re: diet.   # Thoracic aneurysm-approximately 4.5 cm in size; on surveillance; CT FEB 2024-4.7 cm.  S/p evaluation with 09/18/2022 Dr. Lucky Cowboy.- stable; continue every 6 months imaging.Stable.   # Chronic fluid retention-as needed Lasix Aldactone.  No worsening of renal function.- Stable.   # port malfunction: fibrin sheath [2022] s/p TPA-; dysfunction noted- OK today. Stable.   b12 -12/01; q9W;    # DISPOSITION:  # HOLD Alimta today; HOLD B12 injection  # Follow up 3 months-  Friday MD-port flush- labs- cbc/cmp; Magnesium; possible Alimta; b12 injection; CT chest prior-- Dr.B  # I reviewed the blood work- with the patient in detail; also reviewed the imaging independently [as summarized above]; and with the patient in detail.     Orders Placed This Encounter  Procedures   CT CHEST W CONTRAST    Standing Status:   Future    Standing Expiration Date:   02/01/2024    Order Specific Question:   If indicated for the ordered procedure, I authorize the administration of contrast media per Radiology protocol    Answer:   Yes    Order Specific Question:   Preferred imaging location?    Answer:   [facility]   CBC with Differential    Standing Status:   Future    Standing Expiration Date:   05/09/2024   Comprehensive metabolic panel    Standing Status:   Future    Standing Expiration Date:   05/09/2024    Magnesium    Standing Status:   Future    Standing Expiration Date:   02/01/2024     All questions were answered. The patient knows to call the clinic with any problems, questions or concerns.      [name], MD 02/01/2023 1:10 PM

## 2023-02-04 ENCOUNTER — Other Ambulatory Visit: Payer: Self-pay | Admitting: Internal Medicine

## 2023-02-04 DIAGNOSIS — C3402 Malignant neoplasm of left main bronchus: Secondary | ICD-10-CM

## 2023-02-05 ENCOUNTER — Encounter: Payer: Self-pay | Admitting: Internal Medicine

## 2023-03-14 ENCOUNTER — Telehealth: Payer: Self-pay | Admitting: *Deleted

## 2023-03-14 NOTE — Telephone Encounter (Signed)
Patient called questioning his appts for tomorrow. He states he has a scan at the end of may and was to see DR B after the scan. RN looked at Dr Sharlette Dense note from 02/01/23 . It does say Follow up in 3 months after CT scan. Told patient we will cancel tomm appts and we would reschedule his appts after CT scan end of May or early June.

## 2023-03-14 NOTE — Telephone Encounter (Signed)
Error

## 2023-03-15 ENCOUNTER — Inpatient Hospital Stay: Payer: BC Managed Care – PPO

## 2023-03-15 ENCOUNTER — Other Ambulatory Visit: Payer: Self-pay | Admitting: Internal Medicine

## 2023-03-15 ENCOUNTER — Inpatient Hospital Stay: Payer: BC Managed Care – PPO | Admitting: Internal Medicine

## 2023-03-31 ENCOUNTER — Other Ambulatory Visit: Payer: Self-pay | Admitting: Internal Medicine

## 2023-04-01 ENCOUNTER — Encounter: Payer: Self-pay | Admitting: Internal Medicine

## 2023-04-22 ENCOUNTER — Ambulatory Visit
Admission: RE | Admit: 2023-04-22 | Discharge: 2023-04-22 | Disposition: A | Discharge status: Home / Self Care | Payer: BC Managed Care – PPO | Source: Ambulatory Visit | Attending: Internal Medicine | Admitting: Internal Medicine

## 2023-04-22 DIAGNOSIS — C3402 Malignant neoplasm of left main bronchus: Secondary | ICD-10-CM | POA: Insufficient documentation

## 2023-04-22 MED ORDER — IOHEXOL 300 MG/ML  SOLN
75.0000 mL | Freq: Once | INTRAMUSCULAR | Status: AC | PRN
  Administered 2023-04-22: 75 mL via INTRAVENOUS

## 2023-04-26 ENCOUNTER — Inpatient Hospital Stay: Payer: BC Managed Care – PPO | Attending: Internal Medicine | Admitting: Internal Medicine

## 2023-04-26 ENCOUNTER — Inpatient Hospital Stay: Payer: BC Managed Care – PPO

## 2023-04-26 ENCOUNTER — Encounter: Payer: Self-pay | Admitting: Internal Medicine

## 2023-04-26 DIAGNOSIS — R609 Edema, unspecified: Secondary | ICD-10-CM | POA: Diagnosis not present

## 2023-04-26 DIAGNOSIS — C3402 Malignant neoplasm of left main bronchus: Secondary | ICD-10-CM

## 2023-04-26 DIAGNOSIS — M7989 Other specified soft tissue disorders: Secondary | ICD-10-CM | POA: Insufficient documentation

## 2023-04-26 DIAGNOSIS — E538 Deficiency of other specified B group vitamins: Secondary | ICD-10-CM | POA: Diagnosis present

## 2023-04-26 DIAGNOSIS — Z5111 Encounter for antineoplastic chemotherapy: Secondary | ICD-10-CM

## 2023-04-26 DIAGNOSIS — Z79899 Other long term (current) drug therapy: Secondary | ICD-10-CM | POA: Insufficient documentation

## 2023-04-26 DIAGNOSIS — K760 Fatty (change of) liver, not elsewhere classified: Secondary | ICD-10-CM | POA: Diagnosis not present

## 2023-04-26 DIAGNOSIS — M2550 Pain in unspecified joint: Secondary | ICD-10-CM | POA: Diagnosis not present

## 2023-04-26 LAB — COMPREHENSIVE METABOLIC PANEL
ALT: 21 U/L (ref 0–44)
AST: 32 U/L (ref 15–41)
Albumin: 4.2 g/dL (ref 3.5–5.0)
Alkaline Phosphatase: 39 U/L (ref 38–126)
Anion gap: 11 (ref 5–15)
BUN: 19 mg/dL (ref 6–20)
CO2: 26 mmol/L (ref 22–32)
Calcium: 9.2 mg/dL (ref 8.9–10.3)
Chloride: 96 mmol/L — ABNORMAL LOW (ref 98–111)
Creatinine, Ser: 1 mg/dL (ref 0.61–1.24)
GFR, Estimated: >60 mL/min (ref >60)
Glucose, Bld: 101 mg/dL — ABNORMAL HIGH (ref 70–99)
Potassium: 3.8 mmol/L (ref 3.5–5.1)
Sodium: 133 mmol/L — ABNORMAL LOW (ref 135–145)
Total Bilirubin: 0.9 mg/dL (ref 0.3–1.2)
Total Protein: 7.6 g/dL (ref 6.5–8.1)

## 2023-04-26 LAB — CBC WITH DIFFERENTIAL/PLATELET
Abs Immature Granulocytes: 0.01 10*3/uL (ref 0.00–0.07)
Basophils Absolute: 0 10*3/uL (ref 0.0–0.1)
Basophils Relative: 1 %
Eosinophils Absolute: 0.1 10*3/uL (ref 0.0–0.5)
Eosinophils Relative: 2 %
HCT: 42.5 % (ref 39.0–52.0)
Hemoglobin: 14.6 g/dL (ref 13.0–17.0)
Immature Granulocytes: 0 %
Lymphocytes Relative: 34 %
Lymphs Abs: 1.7 10*3/uL (ref 0.7–4.0)
MCH: 31.7 pg (ref 26.0–34.0)
MCHC: 34.4 g/dL (ref 30.0–36.0)
MCV: 92.2 fL (ref 80.0–100.0)
Monocytes Absolute: 0.5 10*3/uL (ref 0.1–1.0)
Monocytes Relative: 10 %
Neutro Abs: 2.6 10*3/uL (ref 1.7–7.7)
Neutrophils Relative %: 53 %
Platelets: 171 10*3/uL (ref 150–400)
RBC: 4.61 MIL/uL (ref 4.22–5.81)
RDW: 12.1 % (ref 11.5–15.5)
WBC: 4.9 10*3/uL (ref 4.0–10.5)
nRBC: 0 % (ref 0.0–0.2)

## 2023-04-26 LAB — MAGNESIUM: Magnesium: 2 mg/dL (ref 1.7–2.4)

## 2023-04-26 MED ORDER — HEPARIN SOD (PORK) LOCK FLUSH 100 UNIT/ML IV SOLN
500.0000 [IU] | Freq: Once | INTRAVENOUS | Status: AC
  Administered 2023-04-26: 500 [IU] via INTRAVENOUS
  Filled 2023-04-26: qty 5

## 2023-04-26 MED ORDER — SODIUM CHLORIDE 0.9% FLUSH
10.0000 mL | INTRAVENOUS | Status: DC | PRN
  Administered 2023-04-26: 10 mL via INTRAVENOUS
  Filled 2023-04-26: qty 10

## 2023-04-26 NOTE — Progress Notes (Signed)
[facility] OFFICE PROGRESS NOTE  Patient Care Team: Bosie Clos, MD as PCP - General (Family Medicine) Earna Coder, MD as Consulting Physician (Internal Medicine)   Cancer Staging  No matching staging information was found for the patient.   Oncology History Overview Note  # 2012- METASTATIC ADENO CA of LEFT LUNG [acinar pattern] s/p MED LN Bx [NEG- EGFR/K-ras/ALK mutation];[name]; [name]; CT JULY  2016- NED; JAN 2018- NED;   # AUG 2020-clinical trial closed; continue maintenance Alimta; HELD JAN 2024- [leg cramps/leg swelling/worsening arthralgias/bilateral palm and soles itching].   # Thoracic aortic aneurysm-4.0 cm in 2016; currently 4.6 cm.  s/p referral to Dr. Inez Catalina.; MRI in 6 months planned.  ----------------------------------------------------------    DIAGNOSIS: [place] ] Adenocarcinoma lung  STAGE: 4       ;GOALS: Palliative  CURRENT/MOST RECENT THERAPY [ ]  Alimta maintenance    Cancer of hilus of left lung ([facility])  07/20/2016 Initial Diagnosis   Cancer of hilus of left lung ([facility])   12/11/2019 - 06/29/2022 Chemotherapy   Patient is on Treatment Plan : LUNG Pemetrexed (Alimta) q21d      12/11/2019 -  Chemotherapy   Patient is on Treatment Plan : LUNG Pemetrexed (500) q21d       INTERVAL HISTORY: Alone.  Ambulating independently.  Mitchell Mosley 62 y.o.  male pleasant patient above history of metastatic adenocarcinoma the lung-NED currently on surveillance off Alimta [HELD JAN 2024] is here for follow-up/reviews of the CT scan.  Deep internal itch in bottom of feet and both palms of hands. No pain. Feels well otherwise.   Patient notes improvement of his joint pains.  And also cramping in the legs is better. No new shortness of breath or cough. Currently  on Lasix/Aldactone for chronic lower extremity swelling.  Review of Systems   Constitutional:  Negative for chills, diaphoresis, fever, malaise/fatigue and weight loss.  HENT:  Negative for nosebleeds and sore throat.   Eyes:  Negative for double vision.  Respiratory:  Negative for hemoptysis, shortness of breath and wheezing.   Cardiovascular:  Positive for leg swelling. Negative for chest pain, palpitations and orthopnea.  Gastrointestinal:  Negative for abdominal pain, blood in stool, constipation, diarrhea, heartburn, melena, nausea and vomiting.  Genitourinary:  Negative for dysuria, frequency and urgency.  Musculoskeletal:  Positive for joint pain.  Skin: Negative.  Negative for itching and rash.  Neurological:  Negative for dizziness, tingling, focal weakness, weakness and headaches.  Endo/Heme/Allergies:  Does not bruise/bleed easily.  Psychiatric/Behavioral:  Negative for depression. The patient is not nervous/anxious and does not have insomnia.       PAST MEDICAL HISTORY :  Past Medical History:  Diagnosis Date   Allergy    Lung cancer ([facility])    Mild valvular heart disease Nov. 2014   per 2 D Echocardiogram done for persistent leg edema, monitored by cardiology   Peripheral edema    Thoracic aortic aneurysm (TAA) ([facility])     PAST SURGICAL HISTORY :   Past Surgical History:  Procedure Laterality Date   ELBOW SURGERY Right 2011   IR CV LINE INJECTION  09/11/2021   Left anterior thoracotomy with biopsy  March 2012   PORTACATH PLACEMENT  March 2012   TONSILLECTOMY  as child    FAMILY HISTORY :   Family History  Problem Relation Age of Onset   Leukemia Father    Diabetes Mother  SOCIAL HISTORY:   Social History   Tobacco Use   Smoking status: Never   Smokeless tobacco: Never  Vaping Use   Vaping Use: Never used  Substance Use Topics   Alcohol use: Yes    Alcohol/week: 0.0 standard drinks of alcohol    Comment: "social drinker"    Drug use: No    ALLERGIES:  is allergic to penicillins.  MEDICATIONS:  Current Outpatient  Medications  Medication Sig Dispense Refill   folic acid (FOLVITE) 1 MG tablet TAKE 1 TABLET BY MOUTH EVERY DAY 90 tablet 3   furosemide (LASIX) 20 MG tablet TAKE 1 TABLET (20 MG TOTAL) BY MOUTH DAILY AS NEEDED FOR SWELLING 90 tablet 1   Multiple Vitamin (MULTIVITAMIN) capsule Take 1 capsule by mouth daily. Reported on 02/24/2016     NON FORMULARY Relief Factor with vitamin D and Turmeric     Omega-3 Fatty Acids (FISH OIL) 1000 MG CAPS Take 1 capsule by mouth daily. Reported on 02/24/2016     senna (SENOKOT) 8.6 MG tablet Take 1 tablet by mouth daily. Take as needed around time of chemotherapy     spironolactone (ALDACTONE) 25 MG tablet Take 1 tablet (25 mg total) by mouth daily. 90 tablet 3   sildenafil (VIAGRA) 100 MG tablet TAKE 1 TABLET BY MOUTH EVERY DAY AS NEEDED FOR ERECTILE DYSFUNCTION (Patient not taking: Reported on 04/26/2023) 15 tablet 11   tadalafil (CIALIS) 20 MG tablet Take one tablet every 2 days as needed (Patient not taking: Reported on 04/26/2023) 50 tablet 5   triamcinolone ointment (KENALOG) 0.5 % Apply 1 Application topically at bedtime. (Patient not taking: Reported on 04/26/2023) 30 g 0   vitamin B-12 (CYANOCOBALAMIN) 1000 MCG tablet Take 1,000 mcg by mouth daily. (Patient not taking: Reported on 04/26/2023)     No current facility-administered medications for this visit.   Facility-Administered Medications Ordered in Other Visits  Medication Dose Route Frequency Provider Last Rate Last Admin   heparin lock flush 100 unit/mL  500 Units Intravenous Once Louretta Shorten R, MD       sodium chloride 0.9 % injection 10 mL  10 mL Intracatheter PRN Janese Banks, MD   10 mL at 05/27/15 1543   sodium chloride 0.9 % injection 10 mL  10 mL Intracatheter PRN Janese Banks, MD   10 mL at 06/17/15 1510   sodium chloride 0.9 % injection 10 mL  10 mL Intravenous PRN Johney Maine, MD   10 mL at 07/08/15 1340   sodium chloride flush (NS) 0.9 % injection 10 mL  10 mL Intravenous PRN  Alinda Dooms, NP   10 mL at 09/30/20 1412   sodium chloride flush (NS) 0.9 % injection 10 mL  10 mL Intravenous PRN Earna Coder, MD   10 mL at 04/26/23 1308    PHYSICAL EXAMINATION: ECOG PERFORMANCE STATUS: 0 - Asymptomatic  BP 124/69 (BP Location: Left Arm, Patient Position: Sitting)   Pulse 80   Temp 97.9 F (36.6 C) (Tympanic)   Resp 18   Ht 5\' 10"  (1.778 m)   Wt 195 lb 3.2 oz (88.5 kg)   SpO2 100%   BMI 28.01 kg/m   Filed Weights   04/26/23 1319  Weight: 195 lb 3.2 oz (88.5 kg)      Physical Exam HENT:     Head: Normocephalic and atraumatic.     Mouth/Throat:     Pharynx: No oropharyngeal exudate.  Eyes:     Pupils: Pupils are equal,  round, and reactive to light.  Cardiovascular:     Rate and Rhythm: Normal rate and regular rhythm.  Pulmonary:     Effort: No respiratory distress.     Breath sounds: No wheezing.  Abdominal:     General: Bowel sounds are normal. There is no distension.     Palpations: Abdomen is soft. There is no mass.     Tenderness: There is no abdominal tenderness. There is no guarding or rebound.  Musculoskeletal:        General: No tenderness. Normal range of motion.     Cervical back: Normal range of motion and neck supple.     Comments: Grade 1 swelling in the legs bilaterally.  Skin:    General: Skin is warm.  Neurological:     Mental Status: He is alert and oriented to person, place, and time.  Psychiatric:        Mood and Affect: Affect normal.      LABORATORY DATA:  I have reviewed the data as listed    Component Value Date/Time   NA 133 (L) 04/26/2023 1255   NA 137 06/19/2021 1121   NA 138 12/10/2014 1341   K 3.8 04/26/2023 1255   K 3.6 12/10/2014 1341   CL 96 (L) 04/26/2023 1255   CL 102 12/10/2014 1341   CO2 26 04/26/2023 1255   CO2 30 12/10/2014 1341   GLUCOSE 101 (H) 04/26/2023 1255   GLUCOSE 160 (H) 12/10/2014 1341   BUN 19 04/26/2023 1255   BUN 18 06/19/2021 1121   BUN 21 (H) 12/10/2014 1341    CREATININE 1.00 04/26/2023 1255   CREATININE 1.10 03/25/2015 1453   CALCIUM 9.2 04/26/2023 1255   CALCIUM 8.4 (L) 12/10/2014 1341   PROT 7.6 04/26/2023 1255   PROT 7.3 06/19/2021 1121   PROT 6.6 12/10/2014 1341   ALBUMIN 4.2 04/26/2023 1255   ALBUMIN 4.5 06/19/2021 1121   ALBUMIN 3.4 12/10/2014 1341   AST 32 04/26/2023 1255   AST 17 12/10/2014 1341   ALT 21 04/26/2023 1255   ALT 23 12/10/2014 1341   ALKPHOS 39 04/26/2023 1255   ALKPHOS 50 12/10/2014 1341   BILITOT 0.9 04/26/2023 1255   BILITOT 0.2 06/19/2021 1121   BILITOT 0.5 12/10/2014 1341   GFRNONAA >60 04/26/2023 1255   GFRNONAA >60 03/25/2015 1453   GFRAA >60 08/19/2020 1324   GFRAA >60 03/25/2015 1453    No results found for: "SPEP", "UPEP"  Lab Results  Component Value Date   WBC 4.9 04/26/2023   NEUTROABS 2.6 04/26/2023   HGB 14.6 04/26/2023   HCT 42.5 04/26/2023   MCV 92.2 04/26/2023   PLT 171 04/26/2023      Chemistry      Component Value Date/Time   NA 133 (L) 04/26/2023 1255   NA 137 06/19/2021 1121   NA 138 12/10/2014 1341   K 3.8 04/26/2023 1255   K 3.6 12/10/2014 1341   CL 96 (L) 04/26/2023 1255   CL 102 12/10/2014 1341   CO2 26 04/26/2023 1255   CO2 30 12/10/2014 1341   BUN 19 04/26/2023 1255   BUN 18 06/19/2021 1121   BUN 21 (H) 12/10/2014 1341   CREATININE 1.00 04/26/2023 1255   CREATININE 1.10 03/25/2015 1453      Component Value Date/Time   CALCIUM 9.2 04/26/2023 1255   CALCIUM 8.4 (L) 12/10/2014 1341   ALKPHOS 39 04/26/2023 1255   ALKPHOS 50 12/10/2014 1341   AST 32 04/26/2023 1255  AST 17 12/10/2014 1341   ALT 21 04/26/2023 1255   ALT 23 12/10/2014 1341   BILITOT 0.9 04/26/2023 1255   BILITOT 0.2 06/19/2021 1121   BILITOT 0.5 12/10/2014 1341       RADIOGRAPHIC STUDIES: I have personally reviewed the radiological images as listed and agreed with the findings in the report. No results found.   ASSESSMENT & PLAN:  Cancer of hilus of left lung ([facility]) # Adenocarcinoma the  lung; stage IV;  CT CAP- FEB 24th, 2024-  No evidence of metastatic disease.  Stable right lung nodules noted 2 to 3 mm in size however one small right lower lobe pulmonary nodule measuring 4 mm, slightly increased in size from 3 mm on the prior.  Other lung nodules stable. CT MAY 20th, 2024-  No evidence of recurrent or metastatic disease.  New mild multilobar peribronchovascular patchy ground-glass, infectious/inflammatory in etiology.   # Continue to hold chemotherapy today.  Otherwise labs adequate. I reviewed the above findings with the patient in detail.  Discussed that repeat scan in 6 months would be reasonable to assess/monitor. Patient is in agreement.    # March 2024-CT scan incidental fatty liver infiltration-discussed with the patient; counseled the patient re: diet/losing weight.   # Thoracic aneurysm-approximately 4.5 cm in size; on surveillance; CT FEB 2024-4.7 cm.  S/p evaluation with 09/18/2022 Dr. Wyn Quaker.- stable; continue every 6 months imaging.Stable.   # Chronic fluid retention/ venous stasis--as needed Lasix Aldactone.  No worsening of renal function.- Stable.   # port malfunction: fibrin sheath [2022] s/p TPA-; dysfunction noted- OK today. Stable.   b12 -12/01; q9W;    # DISPOSITION:  # HOLD Alimta today; # port flush in 3 months  # Follow up 6 months-  Fridays-  MD-port flush- labs- cbc/cmp; Magnesium;  CT CAP prior- Dr.B  # I reviewed the blood work- with the patient in detail; also reviewed the imaging independently [as summarized above]; and with the patient in detail.     Orders Placed This Encounter  Procedures   CT CHEST ABDOMEN PELVIS W CONTRAST    Standing Status:   Future    Standing Expiration Date:   04/25/2024    Order Specific Question:   If indicated for the ordered procedure, I authorize the administration of contrast media per Radiology protocol    Answer:   Yes    Order Specific Question:   Does the patient have a contrast media/X-ray dye allergy?     Answer:   Yes    Order Specific Question:   Preferred imaging location?    Answer:   [facility]    Order Specific Question:   If indicated for the ordered procedure, I authorize the administration of oral contrast media per Radiology protocol    Answer:   Yes     All questions were answered. The patient knows to call the clinic with any problems, questions or concerns.      Earna Coder, MD 04/26/2023 1:41 PM

## 2023-04-26 NOTE — Assessment & Plan Note (Signed)
#   Adenocarcinoma the lung; stage IV;  CT CAP- FEB 24th, 2024-  No evidence of metastatic disease.  Stable right lung nodules noted 2 to 3 mm in size however one small right lower lobe pulmonary nodule measuring 4 mm, slightly increased in size from 3 mm on the prior.  Other lung nodules stable. CT MAY 20th, 2024-  No evidence of recurrent or metastatic disease.  New mild multilobar peribronchovascular patchy ground-glass, infectious/inflammatory in etiology.   # Continue to hold chemotherapy today.  Otherwise labs adequate. I reviewed the above findings with the patient in detail.  Discussed that repeat scan in 6 months would be reasonable to assess/monitor. Patient is in agreement.    # March 2024-CT scan incidental fatty liver infiltration-discussed with the patient; counseled the patient re: diet/losing weight.   # Thoracic aneurysm-approximately 4.5 cm in size; on surveillance; CT FEB 2024-4.7 cm.  S/p evaluation with 09/18/2022 Dr. Wyn Quaker.- stable; continue every 6 months imaging.Stable.   # Chronic fluid retention/ venous stasis--as needed Lasix Aldactone.  No worsening of renal function.- Stable.   # port malfunction: fibrin sheath [2022] s/p TPA-; dysfunction noted- OK today. Stable.   b12 -12/01; q9W;    # DISPOSITION:  # HOLD Alimta today; # port flush in 3 months  # Follow up 6 months-  Fridays-  MD-port flush- labs- cbc/cmp; Magnesium;  CT CAP prior- Dr.B  # I reviewed the blood work- with the patient in detail; also reviewed the imaging independently [as summarized above]; and with the patient in detail.

## 2023-04-26 NOTE — Progress Notes (Signed)
Deep internal itch in bottom of feet and both palms of hands. No pain. Feels well.

## 2023-04-27 ENCOUNTER — Other Ambulatory Visit: Payer: Self-pay

## 2023-07-26 ENCOUNTER — Inpatient Hospital Stay: Payer: BC Managed Care – PPO | Attending: Internal Medicine

## 2023-07-26 DIAGNOSIS — Z95828 Presence of other vascular implants and grafts: Secondary | ICD-10-CM

## 2023-07-26 DIAGNOSIS — Z452 Encounter for adjustment and management of vascular access device: Secondary | ICD-10-CM | POA: Insufficient documentation

## 2023-07-26 DIAGNOSIS — C3492 Malignant neoplasm of unspecified part of left bronchus or lung: Secondary | ICD-10-CM | POA: Diagnosis present

## 2023-07-26 MED ORDER — SODIUM CHLORIDE 0.9% FLUSH
10.0000 mL | Freq: Once | INTRAVENOUS | Status: AC
  Administered 2023-07-26: 10 mL via INTRAVENOUS
  Filled 2023-07-26: qty 10

## 2023-07-26 MED ORDER — HEPARIN SOD (PORK) LOCK FLUSH 100 UNIT/ML IV SOLN
500.0000 [IU] | Freq: Once | INTRAVENOUS | Status: AC
  Administered 2023-07-26: 500 [IU] via INTRAVENOUS
  Filled 2023-07-26: qty 5

## 2023-09-01 ENCOUNTER — Other Ambulatory Visit: Payer: Self-pay | Admitting: Internal Medicine

## 2023-09-02 ENCOUNTER — Encounter: Payer: Self-pay | Admitting: Internal Medicine

## 2023-09-17 ENCOUNTER — Ambulatory Visit (INDEPENDENT_AMBULATORY_CARE_PROVIDER_SITE_OTHER): Payer: BC Managed Care – PPO | Admitting: Vascular Surgery

## 2023-09-17 VITALS — BP 135/78 | HR 75 | Resp 19 | Ht 70.0 in | Wt 189.2 lb

## 2023-09-17 DIAGNOSIS — I712 Thoracic aortic aneurysm, without rupture, unspecified: Secondary | ICD-10-CM

## 2023-09-17 DIAGNOSIS — I739 Peripheral vascular disease, unspecified: Secondary | ICD-10-CM | POA: Insufficient documentation

## 2023-09-17 NOTE — Assessment & Plan Note (Signed)
Patient describes symptoms consistent with claudication.  We discussed that this could be arterial insufficiency or neurogenic claudication as the 2 most common diagnoses.  We will get noninvasive studies in the near future at his convenience to assess for arterial insufficiency.

## 2023-09-17 NOTE — Progress Notes (Signed)
MRN : [id number]  Mitchell Mosley is a 62 y.o. ([birth year]) male who presents with chief complaint of  Chief Complaint  Patient presents with   Venous Insufficiency  .  History of Present Illness: Patient returns today in follow up of his thoracic aortic aneurysm.  He has no current symptoms referable to his aneurysm.  He has been having some cramping in his legs and symptoms that could be concerning for claudication.  He has not had a vascular assessment to the lower extremities to his knowledge.  He gets CT scans to follow his malignancy and his last CT scan was about 4 5 months ago and I have reviewed this.  This demonstrates a stable ascending thoracic aortic aneurysm just below 4.5 cm in maximal diameter.  No growth from his study last year.  Current Outpatient Medications  Medication Sig Dispense Refill   folic acid (FOLVITE) 1 MG tablet TAKE 1 TABLET BY MOUTH EVERY DAY 90 tablet 3   furosemide (LASIX) 20 MG tablet TAKE 1 TABLET (20 MG TOTAL) BY MOUTH DAILY AS NEEDED FOR SWELLING 90 tablet 1   Multiple Vitamin (MULTIVITAMIN) capsule Take 1 capsule by mouth daily. Reported on 02/24/2016     NON FORMULARY Relief Factor with vitamin D and Turmeric     spironolactone (ALDACTONE) 25 MG tablet Take 1 tablet (25 mg total) by mouth daily. 90 tablet 3   No current facility-administered medications for this visit.   Facility-Administered Medications Ordered in Other Visits  Medication Dose Route Frequency Provider Last Rate Last Admin   heparin lock flush 100 unit/mL  500 Units Intravenous Once Louretta Shorten R, MD       sodium chloride 0.9 % injection 10 mL  10 mL Intracatheter PRN Janese Banks, MD   10 mL at 05/27/15 1543   sodium chloride 0.9 % injection 10 mL  10 mL Intracatheter PRN Janese Banks, MD   10 mL at 06/17/15 1510   sodium chloride 0.9 % injection 10 mL  10 mL Intravenous PRN Johney Maine, MD   10 mL at 07/08/15 1340   sodium chloride flush (NS) 0.9 % injection 10  mL  10 mL Intravenous PRN Alinda Dooms, NP   10 mL at 09/30/20 1412    Past Medical History:  Diagnosis Date   Allergy    Lung cancer (HCC)    Mild valvular heart disease Nov. 2014   per 2 D Echocardiogram done for persistent leg edema, monitored by cardiology   Peripheral edema    Thoracic aortic aneurysm (TAA) [facility])     Past Surgical History:  Procedure Laterality Date   ELBOW SURGERY Right 2011   IR CV LINE INJECTION  09/11/2021   Left anterior thoracotomy with biopsy  March 2012   PORTACATH PLACEMENT  March 2012   TONSILLECTOMY  as child     Social History   Tobacco Use   Smoking status: Never   Smokeless tobacco: Never  Vaping Use   Vaping status: Never Used  Substance Use Topics   Alcohol use: Yes    Alcohol/week: 0.0 standard drinks of alcohol    Comment: "social drinker"    Drug use: No       Family History  Problem Relation Age of Onset   Leukemia Father    Diabetes Mother      Allergies  Allergen Reactions   Penicillins     Reaction and severity unknown     REVIEW OF SYSTEMS (Negative unless  checked)  Constitutional: [] Weight loss  [] Fever  [] Chills Cardiac: [] Chest pain   [] Chest pressure   [] Palpitations   [] Shortness of breath when laying flat   [] Shortness of breath at rest   [] Shortness of breath with exertion. Vascular:  [x] Pain in legs with walking   [x] Pain in legs at rest   [] Pain in legs when laying flat   [x] Claudication   [] Pain in feet when walking  [] Pain in feet at rest  [] Pain in feet when laying flat   [] History of DVT   [] Phlebitis   [x] Swelling in legs   [] Varicose veins   [] Non-healing ulcers Pulmonary:   [] Uses home oxygen   [] Productive cough   [] Hemoptysis   [] Wheeze  [] COPD   [] Asthma Neurologic:  [] Dizziness  [] Blackouts   [] Seizures   [] History of stroke   [] History of TIA  [] Aphasia   [] Temporary blindness   [] Dysphagia   [] Weakness or numbness in arms   [] Weakness or numbness in legs Musculoskeletal:  [] Arthritis    [] Joint swelling   [x] Joint pain   [] Low back pain Hematologic:  [] Easy bruising  [] Easy bleeding   [] Hypercoagulable state   [] Anemic   Gastrointestinal:  [] Blood in stool   [] Vomiting blood  [] Gastroesophageal reflux/heartburn   [] Abdominal pain Genitourinary:  [] Chronic kidney disease   [] Difficult urination  [] Frequent urination  [] Burning with urination   [] Hematuria Skin:  [] Rashes   [] Ulcers   [] Wounds Psychological:  [] History of anxiety   []  History of major depression.  Physical Examination  BP 135/78 (BP Location: Left Arm)   Pulse 75   Resp 19   Ht 5\' 10"  (1.778 m)   Wt 189 lb 3.2 oz (85.8 kg)   BMI 27.15 kg/m  Gen:  WD/WN, NAD Head: [place]/AT, No temporalis wasting. Ear/Nose/Throat: Hearing grossly intact, nares w/o erythema or drainage Eyes: Conjunctiva clear. Sclera non-icteric Neck: Supple.  Trachea midline Pulmonary:  Good air movement, no use of accessory muscles.  Cardiac: RRR, no JVD Vascular:  Vessel Right Left  Radial Palpable Palpable           Musculoskeletal: M/S 5/5 throughout.  No deformity or atrophy. Trace LE edema. Neurologic: Sensation grossly intact in extremities.  Symmetrical.  Speech is fluent.  Psychiatric: Judgment intact, Mood & affect appropriate for pt's clinical situation. Dermatologic: No rashes or ulcers noted.  No cellulitis or open wounds.      Labs No results found for this or any previous visit (from the past 2160 hour(s)).  Radiology No results found.  Assessment/Plan  Thoracic aortic aneurysm (TAA) (HCC) He gets CT scans to follow his malignancy and his last CT scan was about 4 5 months ago and I have reviewed this.  This demonstrates a stable ascending thoracic aortic aneurysm just below 4.5 cm in maximal diameter.  No growth from his study last year. He gets CT scans regularly to follow his malignancy and I can use these to assess the size of the aneurysm.  Recheck this in 1 year.  Claudication [facility]) Patient describes  symptoms consistent with claudication.  We discussed that this could be arterial insufficiency or neurogenic claudication as the 2 most common diagnoses.  We will get noninvasive studies in the near future at his convenience to assess for arterial insufficiency.    Festus Barren, MD  09/17/2023 1:10 PM    This note was created with Dragon medical transcription system.  Any errors from dictation are purely unintentional

## 2023-09-17 NOTE — Assessment & Plan Note (Signed)
He gets CT scans to follow his malignancy and his last CT scan was about 4 5 months ago and I have reviewed this.  This demonstrates a stable ascending thoracic aortic aneurysm just below 4.5 cm in maximal diameter.  No growth from his study last year. He gets CT scans regularly to follow his malignancy and I can use these to assess the size of the aneurysm.  Recheck this in 1 year.

## 2023-09-18 ENCOUNTER — Other Ambulatory Visit: Payer: Self-pay

## 2023-10-18 ENCOUNTER — Encounter: Payer: Self-pay | Admitting: Internal Medicine

## 2023-10-21 ENCOUNTER — Ambulatory Visit
Admission: RE | Admit: 2023-10-21 | Discharge: 2023-10-21 | Disposition: A | Discharge status: Home / Self Care | Payer: BC Managed Care – PPO | Source: Ambulatory Visit | Attending: Internal Medicine | Admitting: Internal Medicine

## 2023-10-21 DIAGNOSIS — C3402 Malignant neoplasm of left main bronchus: Secondary | ICD-10-CM | POA: Diagnosis present

## 2023-10-21 MED ORDER — IOHEXOL 300 MG/ML  SOLN
85.0000 mL | Freq: Once | INTRAMUSCULAR | Status: AC | PRN
  Administered 2023-10-21: 85 mL via INTRAVENOUS

## 2023-10-25 ENCOUNTER — Inpatient Hospital Stay (HOSPITAL_BASED_OUTPATIENT_CLINIC_OR_DEPARTMENT_OTHER): Payer: BC Managed Care – PPO | Admitting: Internal Medicine

## 2023-10-25 ENCOUNTER — Inpatient Hospital Stay: Payer: BC Managed Care – PPO | Attending: Internal Medicine

## 2023-10-25 ENCOUNTER — Encounter: Payer: Self-pay | Admitting: Internal Medicine

## 2023-10-25 VITALS — BP 132/84 | HR 76 | Temp 97.0°F | Ht 70.0 in | Wt 190.8 lb

## 2023-10-25 DIAGNOSIS — M2550 Pain in unspecified joint: Secondary | ICD-10-CM | POA: Insufficient documentation

## 2023-10-25 DIAGNOSIS — Z9221 Personal history of antineoplastic chemotherapy: Secondary | ICD-10-CM | POA: Diagnosis not present

## 2023-10-25 DIAGNOSIS — C3402 Malignant neoplasm of left main bronchus: Secondary | ICD-10-CM | POA: Diagnosis not present

## 2023-10-25 DIAGNOSIS — R609 Edema, unspecified: Secondary | ICD-10-CM | POA: Diagnosis not present

## 2023-10-25 DIAGNOSIS — C3492 Malignant neoplasm of unspecified part of left bronchus or lung: Secondary | ICD-10-CM | POA: Insufficient documentation

## 2023-10-25 DIAGNOSIS — Z452 Encounter for adjustment and management of vascular access device: Secondary | ICD-10-CM | POA: Insufficient documentation

## 2023-10-25 DIAGNOSIS — K760 Fatty (change of) liver, not elsewhere classified: Secondary | ICD-10-CM | POA: Insufficient documentation

## 2023-10-25 DIAGNOSIS — Z79899 Other long term (current) drug therapy: Secondary | ICD-10-CM | POA: Diagnosis not present

## 2023-10-25 DIAGNOSIS — N400 Enlarged prostate without lower urinary tract symptoms: Secondary | ICD-10-CM | POA: Insufficient documentation

## 2023-10-25 DIAGNOSIS — Z95828 Presence of other vascular implants and grafts: Secondary | ICD-10-CM

## 2023-10-25 LAB — CBC WITH DIFFERENTIAL (CANCER CENTER ONLY)
Abs Immature Granulocytes: 0.01 10*3/uL (ref 0.00–0.07)
Basophils Absolute: 0 10*3/uL (ref 0.0–0.1)
Basophils Relative: 1 %
Eosinophils Absolute: 0.1 10*3/uL (ref 0.0–0.5)
Eosinophils Relative: 2 %
HCT: 43.1 % (ref 39.0–52.0)
Hemoglobin: 15 g/dL (ref 13.0–17.0)
Immature Granulocytes: 0 %
Lymphocytes Relative: 34 %
Lymphs Abs: 1.7 10*3/uL (ref 0.7–4.0)
MCH: 32.4 pg (ref 26.0–34.0)
MCHC: 34.8 g/dL (ref 30.0–36.0)
MCV: 93.1 fL (ref 80.0–100.0)
Monocytes Absolute: 0.4 10*3/uL (ref 0.1–1.0)
Monocytes Relative: 9 %
Neutro Abs: 2.7 10*3/uL (ref 1.7–7.7)
Neutrophils Relative %: 54 %
Platelet Count: 149 10*3/uL — ABNORMAL LOW (ref 150–400)
RBC: 4.63 MIL/uL (ref 4.22–5.81)
RDW: 12.6 % (ref 11.5–15.5)
WBC Count: 4.9 10*3/uL (ref 4.0–10.5)
nRBC: 0 % (ref 0.0–0.2)

## 2023-10-25 LAB — CMP (CANCER CENTER ONLY)
ALT: 19 U/L (ref 0–44)
AST: 24 U/L (ref 15–41)
Albumin: 4.2 g/dL (ref 3.5–5.0)
Alkaline Phosphatase: 46 U/L (ref 38–126)
Anion gap: 13 (ref 5–15)
BUN: 19 mg/dL (ref 8–23)
CO2: 25 mmol/L (ref 22–32)
Calcium: 9.3 mg/dL (ref 8.9–10.3)
Chloride: 98 mmol/L (ref 98–111)
Creatinine: 0.87 mg/dL (ref 0.61–1.24)
GFR, Estimated: >60 mL/min (ref >60)
Glucose, Bld: 144 mg/dL — ABNORMAL HIGH (ref 70–99)
Potassium: 4.1 mmol/L (ref 3.5–5.1)
Sodium: 136 mmol/L (ref 135–145)
Total Bilirubin: 0.6 mg/dL (ref <1.2)
Total Protein: 7.5 g/dL (ref 6.5–8.1)

## 2023-10-25 LAB — MAGNESIUM: Magnesium: 2.1 mg/dL (ref 1.7–2.4)

## 2023-10-25 MED ORDER — HEPARIN SOD (PORK) LOCK FLUSH 100 UNIT/ML IV SOLN
500.0000 [IU] | Freq: Once | INTRAVENOUS | Status: AC
  Administered 2023-10-25: 500 [IU] via INTRAVENOUS
  Filled 2023-10-25: qty 5

## 2023-10-25 NOTE — Progress Notes (Signed)
[facility] OFFICE PROGRESS NOTE  Patient Care Team: Bosie Clos, MD as PCP - General (Family Medicine) Earna Coder, MD as Consulting Physician (Internal Medicine)   Cancer Staging  No matching staging information was found for the patient.    Oncology History Overview Note  # 2012- METASTATIC ADENO CA of LEFT LUNG [acinar pattern] s/p MED LN Bx [NEG- EGFR/K-ras/ALK mutation];[name]; [name]; CT JULY  2016- NED; JAN 2018- NED;   # AUG 2020-clinical trial closed; continue maintenance Alimta; HELD JAN 2024- [leg cramps/leg swelling/worsening arthralgias/bilateral palm and soles itching].   # Thoracic aortic aneurysm-4.0 cm in 2016; currently 4.6 cm.  s/p referral to Dr. Inez Catalina.; MRI in 6 months planned.  ----------------------------------------------------------    DIAGNOSIS: [place] ] Adenocarcinoma lung  STAGE: 4       ;GOALS: Palliative  CURRENT/MOST RECENT THERAPY [ ]  Alimta maintenance    Cancer of hilus of left lung (HCC)  07/20/2016 Initial Diagnosis   Cancer of hilus of left lung (HCC)   12/11/2019 - 06/29/2022 Chemotherapy   Patient is on Treatment Plan : LUNG Pemetrexed (Alimta) q21d      12/11/2019 -  Chemotherapy   Patient is on Treatment Plan : LUNG Pemetrexed (500) q21d       INTERVAL HISTORY: Alone.  Ambulating independently.  Mitchell Mosley 62 y.o.  male pleasant patient above history of metastatic adenocarcinoma the lung-NED currently on surveillance off Alimta [HELD  since JAN 2024] is here for follow-up/reviews of the CT scan.  Patient denies any urinary symptoms.  Denies any shortness of breath or cough.  Patient notes improvement of his joint pains.  Currently  on Lasix/Aldactone for chronic lower extremity swelling.  Review of Systems  Constitutional:  Negative for chills, diaphoresis, fever, malaise/fatigue and weight loss.   HENT:  Negative for nosebleeds and sore throat.   Eyes:  Negative for double vision.  Respiratory:  Negative for hemoptysis, shortness of breath and wheezing.   Cardiovascular:  Positive for leg swelling. Negative for chest pain, palpitations and orthopnea.  Gastrointestinal:  Negative for abdominal pain, blood in stool, constipation, diarrhea, heartburn, melena, nausea and vomiting.  Genitourinary:  Negative for dysuria, frequency and urgency.  Musculoskeletal:  Positive for joint pain.  Skin: Negative.  Negative for itching and rash.  Neurological:  Negative for dizziness, tingling, focal weakness, weakness and headaches.  Endo/Heme/Allergies:  Does not bruise/bleed easily.  Psychiatric/Behavioral:  Negative for depression. The patient is not nervous/anxious and does not have insomnia.       PAST MEDICAL HISTORY :  Past Medical History:  Diagnosis Date   Allergy    Lung cancer (HCC)    Mild valvular heart disease Nov. 2014   per 2 D Echocardiogram done for persistent leg edema, monitored by cardiology   Peripheral edema    Thoracic aortic aneurysm (TAA) (HCC)     PAST SURGICAL HISTORY :   Past Surgical History:  Procedure Laterality Date   ELBOW SURGERY Right 2011   IR CV LINE INJECTION  09/11/2021   Left anterior thoracotomy with biopsy  March 2012   PORTACATH PLACEMENT  March 2012   TONSILLECTOMY  as child    FAMILY HISTORY :   Family History  Problem Relation Age of Onset   Leukemia Father    Diabetes Mother     SOCIAL HISTORY:   Social History   Tobacco Use   Smoking status:  Never   Smokeless tobacco: Never  Vaping Use   Vaping status: Never Used  Substance Use Topics   Alcohol use: Yes    Alcohol/week: 0.0 standard drinks of alcohol    Comment: "social drinker"    Drug use: No    ALLERGIES:  is allergic to penicillins.  MEDICATIONS:  Current Outpatient Medications  Medication Sig Dispense Refill   folic acid (FOLVITE) 1 MG tablet TAKE 1 TABLET BY  MOUTH EVERY DAY 90 tablet 3   furosemide (LASIX) 20 MG tablet TAKE 1 TABLET (20 MG TOTAL) BY MOUTH DAILY AS NEEDED FOR SWELLING 90 tablet 1   Multiple Vitamin (MULTIVITAMIN) capsule Take 1 capsule by mouth daily. Reported on 02/24/2016     NON FORMULARY Relief Factor with vitamin D and Turmeric     spironolactone (ALDACTONE) 25 MG tablet Take 1 tablet (25 mg total) by mouth daily. 90 tablet 3   No current facility-administered medications for this visit.   Facility-Administered Medications Ordered in Other Visits  Medication Dose Route Frequency Provider Last Rate Last Admin   heparin lock flush 100 unit/mL  500 Units Intravenous Once Louretta Shorten R, MD       sodium chloride 0.9 % injection 10 mL  10 mL Intracatheter PRN Janese Banks, MD   10 mL at 05/27/15 1543   sodium chloride 0.9 % injection 10 mL  10 mL Intracatheter PRN Janese Banks, MD   10 mL at 06/17/15 1510   sodium chloride 0.9 % injection 10 mL  10 mL Intravenous PRN Johney Maine, MD   10 mL at 07/08/15 1340   sodium chloride flush (NS) 0.9 % injection 10 mL  10 mL Intravenous PRN Alinda Dooms, NP   10 mL at 09/30/20 1412    PHYSICAL EXAMINATION: ECOG PERFORMANCE STATUS: 0 - Asymptomatic  BP 132/84 (BP Location: Left Arm, Patient Position: Sitting, Cuff Size: Normal)   Pulse 76   Temp (!) 97 F (36.1 C) (Tympanic)   Ht 5\' 10"  (1.778 m)   Wt 190 lb 12.8 oz (86.5 kg)   SpO2 100%   BMI 27.38 kg/m   Filed Weights   10/25/23 1243  Weight: 190 lb 12.8 oz (86.5 kg)      Physical Exam HENT:     Head: Normocephalic and atraumatic.     Mouth/Throat:     Pharynx: No oropharyngeal exudate.  Eyes:     Pupils: Pupils are equal, round, and reactive to light.  Cardiovascular:     Rate and Rhythm: Normal rate and regular rhythm.  Pulmonary:     Effort: No respiratory distress.     Breath sounds: No wheezing.  Abdominal:     General: Bowel sounds are normal. There is no distension.     Palpations: Abdomen  is soft. There is no mass.     Tenderness: There is no abdominal tenderness. There is no guarding or rebound.  Musculoskeletal:        General: No tenderness. Normal range of motion.     Cervical back: Normal range of motion and neck supple.     Comments: Grade 1 swelling in the legs bilaterally.  Skin:    General: Skin is warm.  Neurological:     Mental Status: He is alert and oriented to person, place, and time.  Psychiatric:        Mood and Affect: Affect normal.      LABORATORY DATA:  I have reviewed the data as listed  Component Value Date/Time   NA 136 10/25/2023 1247   NA 137 06/19/2021 1121   NA 138 12/10/2014 1341   K 4.1 10/25/2023 1247   K 3.6 12/10/2014 1341   CL 98 10/25/2023 1247   CL 102 12/10/2014 1341   CO2 25 10/25/2023 1247   CO2 30 12/10/2014 1341   GLUCOSE 144 (H) 10/25/2023 1247   GLUCOSE 160 (H) 12/10/2014 1341   BUN 19 10/25/2023 1247   BUN 18 06/19/2021 1121   BUN 21 (H) 12/10/2014 1341   CREATININE 0.87 10/25/2023 1247   CREATININE 1.10 03/25/2015 1453   CALCIUM 9.3 10/25/2023 1247   CALCIUM 8.4 (L) 12/10/2014 1341   PROT 7.5 10/25/2023 1247   PROT 7.3 06/19/2021 1121   PROT 6.6 12/10/2014 1341   ALBUMIN 4.2 10/25/2023 1247   ALBUMIN 4.5 06/19/2021 1121   ALBUMIN 3.4 12/10/2014 1341   AST 24 10/25/2023 1247   ALT 19 10/25/2023 1247   ALT 23 12/10/2014 1341   ALKPHOS 46 10/25/2023 1247   ALKPHOS 50 12/10/2014 1341   BILITOT 0.6 10/25/2023 1247   GFRNONAA >60 10/25/2023 1247   GFRNONAA >60 03/25/2015 1453   GFRAA >60 08/19/2020 1324   GFRAA >60 03/25/2015 1453    No results found for: "SPEP", "UPEP"  Lab Results  Component Value Date   WBC 4.9 10/25/2023   NEUTROABS 2.7 10/25/2023   HGB 15.0 10/25/2023   HCT 43.1 10/25/2023   MCV 93.1 10/25/2023   PLT 149 (L) 10/25/2023      Chemistry      Component Value Date/Time   NA 136 10/25/2023 1247   NA 137 06/19/2021 1121   NA 138 12/10/2014 1341   K 4.1 10/25/2023 1247   K  3.6 12/10/2014 1341   CL 98 10/25/2023 1247   CL 102 12/10/2014 1341   CO2 25 10/25/2023 1247   CO2 30 12/10/2014 1341   BUN 19 10/25/2023 1247   BUN 18 06/19/2021 1121   BUN 21 (H) 12/10/2014 1341   CREATININE 0.87 10/25/2023 1247   CREATININE 1.10 03/25/2015 1453      Component Value Date/Time   CALCIUM 9.3 10/25/2023 1247   CALCIUM 8.4 (L) 12/10/2014 1341   ALKPHOS 46 10/25/2023 1247   ALKPHOS 50 12/10/2014 1341   AST 24 10/25/2023 1247   ALT 19 10/25/2023 1247   ALT 23 12/10/2014 1341   BILITOT 0.6 10/25/2023 1247       RADIOGRAPHIC STUDIES: I have personally reviewed the radiological images as listed and agreed with the findings in the report. No results found.   ASSESSMENT & PLAN:  Cancer of hilus of left lung (HCC) # Adenocarcinoma the lung; stage IV;  CT CAP- NOV 11th, 2024-  Unchanged scarring of the dependent left lung base with a trace, loculated left pleural effusion;  Interval resolution of previously seen scattered infectious or inflammatory ground-glass in the right lung. No evidence of lymphadenopathy or metastatic disease in the chest, abdomen, or pelvis.  # Continue to hold chemotherapy today.  Otherwise labs adequate. I reviewed the above findings with the patient in detail.  Discussed that repeat scan in 6 months would be reasonable to assess/monitor. Patient is in agreement.    # NOV 2024- CT scan incidental fatty liver infiltration-discussed with the patient; counseled the patient re: diet/losing weight.   # Thoracic aneurysm-approximately 4.5 cm in size; on surveillance; CT FEB 2024-4.7 cm.  S/p evaluation with 09/18/2022 Dr. Feliberto Harts.  Currently awaiting PVD workup with Dr.  Dew.  # Chronic fluid retention/ venous stasis--as needed Lasix Aldactone.  No worsening of renal function.- Stable.   # port malfunction: fibrin sheath [2022] s/p TPA-; dysfunction noted- OK today. Stable.   # Prostatomegaly: no symtoms; check PSA at next visit.   b12 -12/01;  q9W;    # DISPOSITION:  # port flush in 3 months  # Follow up 6 months-  Fridays-  MD-port flush- labs- cbc/cmp; Magnesium; PSA; CT CAP prior- - Dr.B  # I reviewed the blood work- with the patient in detail; also reviewed the imaging independently [as summarized above]; and with the patient in detail.     Orders Placed This Encounter  Procedures   CT CHEST ABDOMEN PELVIS W CONTRAST    Standing Status:   Future    Standing Expiration Date:   10/24/2024    Order Specific Question:   If indicated for the ordered procedure, I authorize the administration of contrast media per Radiology protocol    Answer:   Yes    Order Specific Question:   Does the patient have a contrast media/X-ray dye allergy?    Answer:   Yes    Order Specific Question:   Preferred imaging location?    Answer:   [facility]    Order Specific Question:   If indicated for the ordered procedure, I authorize the administration of oral contrast media per Radiology protocol    Answer:   Yes   CBC with Differential (Cancer Center Only)    Standing Status:   Future    Standing Expiration Date:   10/24/2024   CMP (Cancer Center only)    Standing Status:   Future    Standing Expiration Date:   10/24/2024   Magnesium    Standing Status:   Future    Standing Expiration Date:   10/24/2024     All questions were answered. The patient knows to call the clinic with any problems, questions or concerns.      Earna Coder, MD 10/25/2023 1:37 PM

## 2023-10-25 NOTE — Progress Notes (Signed)
CT CAP 10/21/2023.

## 2023-10-25 NOTE — Assessment & Plan Note (Addendum)
#   Adenocarcinoma the lung; stage IV;  CT CAP- NOV 11th, 2024-  Unchanged scarring of the dependent left lung base with a trace, loculated left pleural effusion;  Interval resolution of previously seen scattered infectious or inflammatory ground-glass in the right lung. No evidence of lymphadenopathy or metastatic disease in the chest, abdomen, or pelvis.  # Continue to hold chemotherapy today.  Otherwise labs adequate. I reviewed the above findings with the patient in detail.  Discussed that repeat scan in 6 months would be reasonable to assess/monitor. Patient is in agreement.    # NOV 2024- CT scan incidental fatty liver infiltration-discussed with the patient; counseled the patient re: diet/losing weight.   # Thoracic aneurysm-approximately 4.5 cm in size; on surveillance; CT FEB 2024-4.7 cm.  S/p evaluation with 09/18/2022 Dr. Feliberto Harts.  Currently awaiting PVD workup with Dr. Wyn Quaker.  # Chronic fluid retention/ venous stasis--as needed Lasix Aldactone.  No worsening of renal function.- Stable.   # port malfunction: fibrin sheath [2022] s/p TPA-; dysfunction noted- OK today. Stable.   # Prostatomegaly: no symtoms; check PSA at next visit.   b12 -12/01; q9W;    # DISPOSITION:  # port flush in 3 months  # Follow up 6 months-  Fridays-  MD-port flush- labs- cbc/cmp; Magnesium; PSA; CT CAP prior- - Dr.B  # I reviewed the blood work- with the patient in detail; also reviewed the imaging independently [as summarized above]; and with the patient in detail.

## 2023-10-29 ENCOUNTER — Ambulatory Visit (INDEPENDENT_AMBULATORY_CARE_PROVIDER_SITE_OTHER): Payer: BC Managed Care – PPO | Admitting: Vascular Surgery

## 2023-10-29 ENCOUNTER — Ambulatory Visit (INDEPENDENT_AMBULATORY_CARE_PROVIDER_SITE_OTHER): Payer: BC Managed Care – PPO

## 2023-10-29 ENCOUNTER — Encounter (INDEPENDENT_AMBULATORY_CARE_PROVIDER_SITE_OTHER): Payer: Self-pay | Admitting: Vascular Surgery

## 2023-10-29 VITALS — BP 139/81 | HR 71 | Resp 16 | Wt 188.0 lb

## 2023-10-29 DIAGNOSIS — C3492 Malignant neoplasm of unspecified part of left bronchus or lung: Secondary | ICD-10-CM

## 2023-10-29 DIAGNOSIS — I712 Thoracic aortic aneurysm, without rupture, unspecified: Secondary | ICD-10-CM

## 2023-10-29 DIAGNOSIS — I739 Peripheral vascular disease, unspecified: Secondary | ICD-10-CM

## 2023-10-29 LAB — VAS US ABI WITH/WO TBI
Left ABI: 1.17
Right ABI: 1.27

## 2023-10-29 NOTE — Assessment & Plan Note (Signed)
I have independently reviewed his recent CT scan of the chest abdomen pelvis.  His ascending thoracic aorta is stable at 4.3 cm in maximal diameter.  No other aneurysmal disease or significant occlusive disease is seen in the aortoiliac segments.  He is getting serial scans for his malignancy.  We can continue to follow him on an annual basis to review his CT scan for his a ascending thoracic aortic aneurysm.  No role for intervention at this size.

## 2023-10-29 NOTE — Progress Notes (Signed)
MRN : [id number]  Mitchell Mosley is a 62 y.o. (May 14, 1961) male who presents with chief complaint of  Chief Complaint  Patient presents with   Follow-up    Ultrasound follow up  .  History of Present Illness: Patient returns today in follow up of multiple issues.  He is followed for an ascending thoracic aortic aneurysm.  He has not recently undergone his surveillance CT scan of the chest abdomen pelvis for his lung cancer. I have independently reviewed his recent CT scan of the chest abdomen pelvis.  His ascending thoracic aorta is stable at 4.3 cm in maximal diameter.  No other aneurysmal disease or significant occlusive disease is seen in the aortoiliac segments.  He was complaining of significant leg pain so to evaluate this we did an aortoiliac duplex and ABIs today.  He has some ectasia of the abdominal aorta but no frank aneurysm.  His ABIs were normal with multiphasic waveforms and good digital pressures consistent with no significant arterial insufficiency as the cause of his leg pain.  Current Outpatient Medications  Medication Sig Dispense Refill   folic acid (FOLVITE) 1 MG tablet TAKE 1 TABLET BY MOUTH EVERY DAY 90 tablet 3   furosemide (LASIX) 20 MG tablet TAKE 1 TABLET (20 MG TOTAL) BY MOUTH DAILY AS NEEDED FOR SWELLING 90 tablet 1   Multiple Vitamin (MULTIVITAMIN) capsule Take 1 capsule by mouth daily. Reported on 02/24/2016     NON FORMULARY Relief Factor with vitamin D and Turmeric     spironolactone (ALDACTONE) 25 MG tablet Take 1 tablet (25 mg total) by mouth daily. 90 tablet 3   No current facility-administered medications for this visit.   Facility-Administered Medications Ordered in Other Visits  Medication Dose Route Frequency Provider Last Rate Last Admin   heparin lock flush 100 unit/mL  500 Units Intravenous Once Louretta Shorten R, MD       sodium chloride 0.9 % injection 10 mL  10 mL Intracatheter PRN Janese Banks, MD   10 mL at 05/27/15 1543   sodium  chloride 0.9 % injection 10 mL  10 mL Intracatheter PRN Janese Banks, MD   10 mL at 06/17/15 1510   sodium chloride 0.9 % injection 10 mL  10 mL Intravenous PRN Johney Maine, MD   10 mL at 07/08/15 1340   sodium chloride flush (NS) 0.9 % injection 10 mL  10 mL Intravenous PRN Alinda Dooms, NP   10 mL at 09/30/20 1412    Past Medical History:  Diagnosis Date   Allergy    Lung cancer (HCC)    Mild valvular heart disease Nov. 2014   per 2 D Echocardiogram done for persistent leg edema, monitored by cardiology   Peripheral edema    Thoracic aortic aneurysm (TAA) [facility])     Past Surgical History:  Procedure Laterality Date   ELBOW SURGERY Right 2011   IR CV LINE INJECTION  09/11/2021   Left anterior thoracotomy with biopsy  March 2012   PORTACATH PLACEMENT  March 2012   TONSILLECTOMY  as child     Social History   Tobacco Use   Smoking status: Never   Smokeless tobacco: Never  Vaping Use   Vaping status: Never Used  Substance Use Topics   Alcohol use: Yes    Alcohol/week: 0.0 standard drinks of alcohol    Comment: "social drinker"    Drug use: No      Family History  Problem Relation Age of Onset  Leukemia Father    Diabetes Mother   No bleeding or clotting disorders  Allergies  Allergen Reactions   Penicillins     Reaction and severity unknown     REVIEW OF SYSTEMS (Negative unless checked)   Constitutional: [] Weight loss  [] Fever  [] Chills Cardiac: [] Chest pain   [] Chest pressure   [] Palpitations   [] Shortness of breath when laying flat   [] Shortness of breath at rest   [] Shortness of breath with exertion. Vascular:  [x] Pain in legs with walking   [x] Pain in legs at rest   [] Pain in legs when laying flat   [x] Claudication   [] Pain in feet when walking  [] Pain in feet at rest  [] Pain in feet when laying flat   [] History of DVT   [] Phlebitis   [x] Swelling in legs   [] Varicose veins   [] Non-healing ulcers Pulmonary:   [] Uses home oxygen   [] Productive cough    [] Hemoptysis   [] Wheeze  [] COPD   [] Asthma Neurologic:  [] Dizziness  [] Blackouts   [] Seizures   [] History of stroke   [] History of TIA  [] Aphasia   [] Temporary blindness   [] Dysphagia   [] Weakness or numbness in arms   [] Weakness or numbness in legs Musculoskeletal:  [] Arthritis   [] Joint swelling   [x] Joint pain   [] Low back pain Hematologic:  [] Easy bruising  [] Easy bleeding   [] Hypercoagulable state   [] Anemic   Gastrointestinal:  [] Blood in stool   [] Vomiting blood  [] Gastroesophageal reflux/heartburn   [] Abdominal pain Genitourinary:  [] Chronic kidney disease   [] Difficult urination  [] Frequent urination  [] Burning with urination   [] Hematuria Skin:  [] Rashes   [] Ulcers   [] Wounds Psychological:  [] History of anxiety   []  History of major depression.  Physical Examination  BP 139/81 (BP Location: Right Arm)   Pulse 71   Resp 16   Wt 188 lb (85.3 kg)   BMI 26.98 kg/m  Gen:  WD/WN, NAD Head: [place]/AT, No temporalis wasting. Ear/Nose/Throat: Hearing grossly intact, nares w/o erythema or drainage Eyes: Conjunctiva clear. Sclera non-icteric Neck: Supple.  Trachea midline Pulmonary:  Good air movement, no use of accessory muscles.  Cardiac: RRR, no JVD Vascular:  Vessel Right Left  Radial Palpable Palpable                          PT Palpable Palpable  DP Palpable Palpable   Gastrointestinal: soft, non-tender/non-distended. No guarding/reflex.  Musculoskeletal: M/S 5/5 throughout.  No deformity or atrophy. No edema. Neurologic: Sensation grossly intact in extremities.  Symmetrical.  Speech is fluent.  Psychiatric: Judgment intact, Mood & affect appropriate for pt's clinical situation. Dermatologic: No rashes or ulcers noted.  No cellulitis or open wounds.      Labs Recent Results (from the past 2160 hour(s))  Magnesium     Status: None   Collection Time: 10/25/23 12:47 PM  Result Value Ref Range   Magnesium 2.1 1.7 - 2.4 mg/dL    Comment: Performed at [facility], [address] [place], [place] [address]  CMP (Cancer Center only)     Status: Abnormal   Collection Time: 10/25/23 12:47 PM  Result Value Ref Range   Sodium 136 135 - 145 mmol/L   Potassium 4.1 3.5 - 5.1 mmol/L   Chloride 98 98 - 111 mmol/L   CO2 25 22 - 32 mmol/L   Glucose, Bld 144 (H) 70 - 99 mg/dL    Comment: Glucose reference range applies only  to samples taken after fasting for at least 8 hours.   BUN 19 8 - 23 mg/dL   Creatinine 9.56 2.13 - 1.24 mg/dL   Calcium 9.3 8.9 - 08.6 mg/dL   Total Protein 7.5 6.5 - 8.1 g/dL   Albumin 4.2 3.5 - 5.0 g/dL   AST 24 15 - 41 U/L   ALT 19 0 - 44 U/L   Alkaline Phosphatase 46 38 - 126 U/L   Total Bilirubin 0.6 <1.2 mg/dL   GFR, Estimated >57 >84 mL/min    Comment: (NOTE) Calculated using the CKD-EPI Creatinine Equation (2021)    Anion gap 13 5 - 15    Comment: Performed at [facility], [address] [place], [place] [address]  CBC with Differential (Cancer Center Only)     Status: Abnormal   Collection Time: 10/25/23 12:47 PM  Result Value Ref Range   WBC Count 4.9 4.0 - 10.5 K/uL   RBC 4.63 4.22 - 5.81 MIL/uL   Hemoglobin 15.0 13.0 - 17.0 g/dL   HCT 52.8 41.3 - 24.4 %   MCV 93.1 80.0 - 100.0 fL   MCH 32.4 26.0 - 34.0 pg   MCHC 34.8 30.0 - 36.0 g/dL   RDW 01.0 27.2 - 53.6 %   Platelet Count 149 (L) 150 - 400 K/uL   nRBC 0.0 0.0 - 0.2 %   Neutrophils Relative % 54 %   Neutro Abs 2.7 1.7 - 7.7 K/uL   Lymphocytes Relative 34 %   Lymphs Abs 1.7 0.7 - 4.0 K/uL   Monocytes Relative 9 %   Monocytes Absolute 0.4 0.1 - 1.0 K/uL   Eosinophils Relative 2 %   Eosinophils Absolute 0.1 0.0 - 0.5 K/uL   Basophils Relative 1 %   Basophils Absolute 0.0 0.0 - 0.1 K/uL   Immature Granulocytes 0 %   Abs Immature Granulocytes 0.01 0.00 - 0.07 K/uL    Comment: Performed at [facility], [address] [place], [place] [address]    Radiology CT CHEST ABDOMEN PELVIS W CONTRAST  Result Date: 10/24/2023 CLINICAL  DATA:  Non-small cell lung cancer restaging, chemotherapy complete * Tracking Code: BO * EXAM: CT CHEST, ABDOMEN, AND PELVIS WITH CONTRAST TECHNIQUE: Multidetector CT imaging of the chest, abdomen and pelvis was performed following the standard protocol during bolus administration of intravenous contrast. RADIATION DOSE REDUCTION: This exam was performed according to the departmental dose-optimization program which includes automated exposure control, adjustment of the mA and/or kV according to patient size and/or use of iterative reconstruction technique. CONTRAST:  85mL OMNIPAQUE IOHEXOL 300 MG/ML  SOLN COMPARISON:  04/22/2023 FINDINGS: CT CHEST FINDINGS Cardiovascular: Left chest port catheter. Unchanged enlargement of the tubular ascending thoracic aorta, measuring up to 4.3 x 4.2 cm. Aortic atherosclerosis. Normal heart size. No pericardial effusion. Mediastinum/Nodes: No enlarged mediastinal, hilar, or axillary lymph nodes. Thyroid gland, trachea, and esophagus demonstrate no significant findings. Lungs/Pleura: Unchanged scarring of the dependent left lung base with a trace, loculated left pleural effusion. Interval resolution of previously seen scattered ground-glass in the right lung. No pleural effusion or pneumothorax. Musculoskeletal: No chest wall abnormality. No acute osseous findings. CT ABDOMEN PELVIS FINDINGS Hepatobiliary: No solid liver abnormality is seen. Hepatic steatosis. No gallstones, gallbladder wall thickening, or biliary dilatation. Pancreas: Unremarkable. No pancreatic ductal dilatation or surrounding inflammatory changes. Spleen: Normal in size without significant abnormality. Adrenals/Urinary Tract: Adrenal glands are unremarkable. Kidneys are normal, without renal calculi, solid lesion, or hydronephrosis. Mild urinary bladder wall thickening, likely  secondary to chronic outlet obstruction. Stomach/Bowel: Stomach is within normal limits. Appendix appears normal. No evidence of bowel  wall thickening, distention, or inflammatory changes. Vascular/Lymphatic: Aortic atherosclerosis. No enlarged abdominal or pelvic lymph nodes. Reproductive: Prostatomegaly. Other: Small fat containing bilateral inguinal hernias.  No ascites. Musculoskeletal: No acute osseous findings. IMPRESSION: 1. Unchanged scarring of the dependent left lung base with a trace, loculated left pleural effusion. 2. Interval resolution of previously seen scattered infectious or inflammatory ground-glass in the right lung. 3. No evidence of lymphadenopathy or metastatic disease in the chest, abdomen, or pelvis. 4. Hepatic steatosis. 5. Prostatomegaly. 6. Unchanged enlargement of the tubular ascending thoracic aorta measuring up to 4.3 cm. Recommend annual imaging followup by CTA or MRA if not otherwise imaged and if clinically appropriate. This recommendation follows 2010 ACCF/AHA/AATS/ACR/ASA/SCA/SCAI/SIR/STS/SVM Guidelines for the Diagnosis and Management of Patients with Thoracic Aortic Disease. Circulation. 2010; 121: E952-W413. Aortic aneurysm NOS (ICD10-I71.9) Aortic Atherosclerosis (ICD10-I70.0). Electronically Signed   By: Jearld Lesch M.D.   On: 10/24/2023 12:31    Assessment/Plan  Thoracic aortic aneurysm (TAA) (HCC) I have independently reviewed his recent CT scan of the chest abdomen pelvis.  His ascending thoracic aorta is stable at 4.3 cm in maximal diameter.  No other aneurysmal disease or significant occlusive disease is seen in the aortoiliac segments.  He is getting serial scans for his malignancy.  We can continue to follow him on an annual basis to review his CT scan for his a ascending thoracic aortic aneurysm.  No role for intervention at this size.  Lung cancer [facility]) Follows with oncology.  Has had resection and chemotherapy.  Claudication [facility]) His ABIs were normal with multiphasic waveforms and good digital pressures consistent with no significant arterial insufficiency as the cause of his leg  pain.  His pain is much more likely musculoskeletal or neurogenic in nature.  No further vascular workup planned at this time.    Festus Barren, MD  10/29/2023 10:19 AM    This note was created with Dragon medical transcription system.  Any errors from dictation are purely unintentional

## 2023-10-29 NOTE — Assessment & Plan Note (Signed)
His ABIs were normal with multiphasic waveforms and good digital pressures consistent with no significant arterial insufficiency as the cause of his leg pain.  His pain is much more likely musculoskeletal or neurogenic in nature.  No further vascular workup planned at this time.

## 2023-10-29 NOTE — Assessment & Plan Note (Signed)
Follows with oncology.  Has had resection and chemotherapy.

## 2023-11-08 ENCOUNTER — Other Ambulatory Visit: Payer: Self-pay | Admitting: Oncology

## 2023-11-08 DIAGNOSIS — R600 Localized edema: Secondary | ICD-10-CM

## 2023-11-08 DIAGNOSIS — C33 Malignant neoplasm of trachea: Secondary | ICD-10-CM

## 2024-01-27 ENCOUNTER — Inpatient Hospital Stay: Payer: BC Managed Care – PPO | Attending: Internal Medicine

## 2024-01-27 DIAGNOSIS — Z95828 Presence of other vascular implants and grafts: Secondary | ICD-10-CM

## 2024-01-27 DIAGNOSIS — C3402 Malignant neoplasm of left main bronchus: Secondary | ICD-10-CM | POA: Insufficient documentation

## 2024-01-27 DIAGNOSIS — Z452 Encounter for adjustment and management of vascular access device: Secondary | ICD-10-CM | POA: Insufficient documentation

## 2024-01-27 MED ORDER — SODIUM CHLORIDE 0.9% FLUSH
10.0000 mL | Freq: Once | INTRAVENOUS | Status: AC
  Administered 2024-01-27: 10 mL via INTRAVENOUS
  Filled 2024-01-27: qty 10

## 2024-01-27 MED ORDER — HEPARIN SOD (PORK) LOCK FLUSH 100 UNIT/ML IV SOLN
500.0000 [IU] | Freq: Once | INTRAVENOUS | Status: AC
  Administered 2024-01-27: 500 [IU] via INTRAVENOUS
  Filled 2024-01-27: qty 5

## 2024-02-24 ENCOUNTER — Ambulatory Visit: Payer: BC Managed Care – PPO

## 2024-03-01 ENCOUNTER — Other Ambulatory Visit: Payer: Self-pay | Admitting: Internal Medicine

## 2024-03-03 ENCOUNTER — Encounter: Payer: Self-pay | Admitting: Internal Medicine

## 2024-04-02 ENCOUNTER — Ambulatory Visit: Payer: Self-pay

## 2024-04-02 NOTE — Telephone Encounter (Signed)
 Chief Complaint: rectal bleeding Symptoms: rectal bleeding Frequency: x 2 days Pertinent Negatives: Patient denies constipation Disposition: [] ED /[] Urgent Care (no appt availability in office) / [] Appointment(In office/virtual)/ []  [facility]/ [] Home Care/ [] Refused Recommended Disposition /[] [place] [facility]/ [x]  Follow-up with PCP Additional Notes: pt states he noticed blood in the toilet. States he first noticed yesterday and has had 2 BM with blood in them. States that it was only a couple drops in the water. Pt sees Dr. Gwynne Less, instructed pt to call his PCP and scheduled an appt to be seen within the next 3 days.    Copied from CRM [id number]. Topic: Clinical - Red Word Triage >> Apr 02, 2024  9:16 AM [place] B wrote: [facility] that prompted transfer to Nurse Triage: pt has blood in stool for the past 2 days Reason for Disposition  MILD rectal bleeding (more than just a few drops or streaks)  Answer Assessment - Initial Assessment Questions 1. APPEARANCE of BLOOD: "What color is it?" "Is it passed separately, on the surface of the stool, or mixed in with the stool?"      separated 2. AMOUNT: "How much blood was passed?"      Less than teaspoon, just a couple drops  3. FREQUENCY: "How many times has blood been passed with the stools?"      2 times  4. ONSET: "When was the blood first seen in the stools?" (Days or weeks)      yesterday 5. DIARRHEA: "Is there also some diarrhea?" If Yes, ask: "How many diarrhea stools in the past 24 hours?"      no 6. CONSTIPATION: "Do you have constipation?" If Yes, ask: "How bad is it?"     no 7. RECURRENT SYMPTOMS: "Have you had blood in your stools before?" If Yes, ask: "When was the last time?" and "What happened that time?"      no 8. BLOOD THINNERS: "Do you take any blood thinners?" (e.g., Coumadin/warfarin, Pradaxa/dabigatran, aspirin)     no 9. OTHER SYMPTOMS: "Do you have any other symptoms?"  (e.g., abdomen pain,  vomiting, dizziness, fever)     no  Protocols used: Rectal Bleeding-A-AH

## 2024-04-20 ENCOUNTER — Ambulatory Visit: Admission: RE | Admit: 2024-04-20 | Payer: BC Managed Care – PPO | Source: Ambulatory Visit

## 2024-04-21 ENCOUNTER — Ambulatory Visit
Admission: RE | Admit: 2024-04-21 | Discharge: 2024-04-21 | Disposition: A | Discharge status: Home / Self Care | Source: Ambulatory Visit | Attending: Internal Medicine | Admitting: Internal Medicine

## 2024-04-21 DIAGNOSIS — C3402 Malignant neoplasm of left main bronchus: Secondary | ICD-10-CM | POA: Insufficient documentation

## 2024-04-21 MED ORDER — IOHEXOL 300 MG/ML  SOLN
85.0000 mL | Freq: Once | INTRAMUSCULAR | Status: AC | PRN
  Administered 2024-04-21: 85 mL via INTRAVENOUS

## 2024-04-24 ENCOUNTER — Other Ambulatory Visit: Payer: BC Managed Care – PPO

## 2024-04-24 ENCOUNTER — Ambulatory Visit: Payer: BC Managed Care – PPO | Admitting: Internal Medicine

## 2024-04-28 ENCOUNTER — Other Ambulatory Visit: Payer: BC Managed Care – PPO

## 2024-04-28 ENCOUNTER — Other Ambulatory Visit: Payer: Self-pay

## 2024-04-28 ENCOUNTER — Ambulatory Visit: Payer: BC Managed Care – PPO | Admitting: Internal Medicine

## 2024-04-28 DIAGNOSIS — N529 Male erectile dysfunction, unspecified: Secondary | ICD-10-CM

## 2024-04-29 ENCOUNTER — Encounter: Payer: Self-pay | Admitting: Internal Medicine

## 2024-04-29 ENCOUNTER — Inpatient Hospital Stay: Payer: BC Managed Care – PPO | Admitting: Internal Medicine

## 2024-04-29 ENCOUNTER — Inpatient Hospital Stay: Payer: BC Managed Care – PPO | Attending: Internal Medicine

## 2024-04-29 VITALS — BP 131/98 | HR 77 | Temp 97.7°F | Resp 16 | Ht 70.0 in | Wt 196.1 lb

## 2024-04-29 DIAGNOSIS — N529 Male erectile dysfunction, unspecified: Secondary | ICD-10-CM

## 2024-04-29 DIAGNOSIS — C3402 Malignant neoplasm of left main bronchus: Secondary | ICD-10-CM

## 2024-04-29 DIAGNOSIS — Z452 Encounter for adjustment and management of vascular access device: Secondary | ICD-10-CM | POA: Insufficient documentation

## 2024-04-29 DIAGNOSIS — R609 Edema, unspecified: Secondary | ICD-10-CM | POA: Diagnosis not present

## 2024-04-29 DIAGNOSIS — N400 Enlarged prostate without lower urinary tract symptoms: Secondary | ICD-10-CM | POA: Insufficient documentation

## 2024-04-29 DIAGNOSIS — I712 Thoracic aortic aneurysm, without rupture, unspecified: Secondary | ICD-10-CM | POA: Insufficient documentation

## 2024-04-29 DIAGNOSIS — I700 Atherosclerosis of aorta: Secondary | ICD-10-CM | POA: Insufficient documentation

## 2024-04-29 DIAGNOSIS — Z95828 Presence of other vascular implants and grafts: Secondary | ICD-10-CM

## 2024-04-29 LAB — CMP (CANCER CENTER ONLY)
ALT: 23 U/L (ref 0–44)
AST: 32 U/L (ref 15–41)
Albumin: 4.3 g/dL (ref 3.5–5.0)
Alkaline Phosphatase: 37 U/L — ABNORMAL LOW (ref 38–126)
Anion gap: 9 (ref 5–15)
BUN: 15 mg/dL (ref 8–23)
CO2: 26 mmol/L (ref 22–32)
Calcium: 8.9 mg/dL (ref 8.9–10.3)
Chloride: 96 mmol/L — ABNORMAL LOW (ref 98–111)
Creatinine: 0.91 mg/dL (ref 0.61–1.24)
GFR, Estimated: >60 mL/min (ref >60)
Glucose, Bld: 134 mg/dL — ABNORMAL HIGH (ref 70–99)
Potassium: 3.9 mmol/L (ref 3.5–5.1)
Sodium: 131 mmol/L — ABNORMAL LOW (ref 135–145)
Total Bilirubin: 1.1 mg/dL (ref 0.0–1.2)
Total Protein: 7.5 g/dL (ref 6.5–8.1)

## 2024-04-29 LAB — CBC WITH DIFFERENTIAL (CANCER CENTER ONLY)
Abs Immature Granulocytes: 0.02 10*3/uL (ref 0.00–0.07)
Basophils Absolute: 0 10*3/uL (ref 0.0–0.1)
Basophils Relative: 1 %
Eosinophils Absolute: 0.1 10*3/uL (ref 0.0–0.5)
Eosinophils Relative: 2 %
HCT: 43.7 % (ref 39.0–52.0)
Hemoglobin: 15.1 g/dL (ref 13.0–17.0)
Immature Granulocytes: 0 %
Lymphocytes Relative: 32 %
Lymphs Abs: 1.5 10*3/uL (ref 0.7–4.0)
MCH: 31.5 pg (ref 26.0–34.0)
MCHC: 34.6 g/dL (ref 30.0–36.0)
MCV: 91.2 fL (ref 80.0–100.0)
Monocytes Absolute: 0.4 10*3/uL (ref 0.1–1.0)
Monocytes Relative: 9 %
Neutro Abs: 2.7 10*3/uL (ref 1.7–7.7)
Neutrophils Relative %: 56 %
Platelet Count: 159 10*3/uL (ref 150–400)
RBC: 4.79 MIL/uL (ref 4.22–5.81)
RDW: 12.2 % (ref 11.5–15.5)
WBC Count: 4.7 10*3/uL (ref 4.0–10.5)
nRBC: 0 % (ref 0.0–0.2)

## 2024-04-29 LAB — PSA: Prostatic Specific Antigen: 3.99 ng/mL (ref 0.00–4.00)

## 2024-04-29 LAB — MAGNESIUM: Magnesium: 2.1 mg/dL (ref 1.7–2.4)

## 2024-04-29 MED ORDER — HEPARIN SOD (PORK) LOCK FLUSH 100 UNIT/ML IV SOLN
500.0000 [IU] | Freq: Once | INTRAVENOUS | Status: AC
  Administered 2024-04-29: 500 [IU] via INTRAVENOUS
  Filled 2024-04-29: qty 5

## 2024-04-29 MED ORDER — SODIUM CHLORIDE 0.9% FLUSH
10.0000 mL | Freq: Once | INTRAVENOUS | Status: AC
  Administered 2024-04-29: 10 mL via INTRAVENOUS
  Filled 2024-04-29: qty 10

## 2024-04-29 NOTE — Progress Notes (Signed)
 [facility] OFFICE PROGRESS NOTE  Patient Care Team: Nikki Barters, MD as PCP - General (Family Medicine) Gwyn Leos, MD as Consulting Physician (Internal Medicine)   Cancer Staging  No matching staging information was found for the patient.    Oncology History Overview Note  # 2012- METASTATIC ADENO CA of LEFT LUNG [acinar pattern] s/p MED LN Bx [NEG- EGFR/K-ras/ALK mutation];PET 2012-neck/Chest/Chest wall/T1/Left Iliac on EliLilly protocol- Carbo-Alimta  x4; on Maint Alimta  [since June 2012]; CT JULY  2016- NED; JAN 2018- NED;   # AUG 2020-clinical trial closed; continue maintenance Alimta ; HELD JAN 2024- [leg cramps/leg swelling/worsening arthralgias/bilateral palm and soles itching].   # Thoracic aortic aneurysm-4.0 cm in 2016; currently 4.6 cm.  s/p referral to Dr. Estefana Heinz.; MRI in 6 months planned.  ----------------------------------------------------------    DIAGNOSIS: Charo.Chase ] Adenocarcinoma lung  STAGE: 4       ;GOALS: Palliative  CURRENT/MOST RECENT THERAPY [ ]  Alimta  maintenance    Cancer of hilus of left lung (HCC)  07/20/2016 Initial Diagnosis   Cancer of hilus of left lung (HCC)   12/11/2019 - 06/29/2022 Chemotherapy   Patient is on Treatment Plan : LUNG Pemetrexed  (Alimta ) q21d      12/11/2019 -  Chemotherapy   Patient is on Treatment Plan : LUNG Pemetrexed  (500) q21d       INTERVAL HISTORY: Alone.  Ambulating independently.  Mitchell Mosley 63 y.o.  male pleasant patient above history of metastatic adenocarcinoma the lung-NED currently on surveillance off Alimta  [HELD  since JAN 2024] is here for follow-up/reviews of the CT scan.   C/o bottom of feet and palms of hands itchy. Uses Cetaphil.  Patient denies any urinary symptoms.  Denies any shortness of breath or cough.  Patient notes improvement of his joint pains.  Currently  on Lasix /Aldactone  for chronic lower extremity swelling. Pt states he is taking his lasix  every day.    Review of Systems  Constitutional:  Negative for chills, diaphoresis, fever, malaise/fatigue and weight loss.  HENT:  Negative for nosebleeds and sore throat.   Eyes:  Negative for double vision.  Respiratory:  Negative for hemoptysis, shortness of breath and wheezing.   Cardiovascular:  Positive for leg swelling. Negative for chest pain, palpitations and orthopnea.  Gastrointestinal:  Negative for abdominal pain, blood in stool, constipation, diarrhea, heartburn, melena, nausea and vomiting.  Genitourinary:  Negative for dysuria, frequency and urgency.  Musculoskeletal:  Positive for joint pain.  Skin: Negative.  Negative for itching and rash.  Neurological:  Negative for dizziness, tingling, focal weakness, weakness and headaches.  Endo/Heme/Allergies:  Does not bruise/bleed easily.  Psychiatric/Behavioral:  Negative for depression. The patient is not nervous/anxious and does not have insomnia.       PAST MEDICAL HISTORY :  Past Medical History:  Diagnosis Date   Allergy    Lung cancer (HCC)    Mild valvular heart disease Nov. 2014   per 2 D Echocardiogram done for persistent leg edema, monitored by cardiology   Peripheral edema    Thoracic aortic aneurysm (TAA) (HCC)     PAST SURGICAL HISTORY :   Past Surgical History:  Procedure Laterality Date   ELBOW SURGERY Right 2011   IR CV LINE INJECTION  09/11/2021   Left anterior thoracotomy with biopsy  March 2012   PORTACATH PLACEMENT  March 2012   TONSILLECTOMY  as child    FAMILY HISTORY :   Family History  Problem Relation Age of Onset   Leukemia Father  Diabetes Mother     SOCIAL HISTORY:   Social History   Tobacco Use   Smoking status: Never   Smokeless tobacco: Never  Vaping Use   Vaping status: Never Used  Substance Use Topics   Alcohol use: Yes    Alcohol/week: 0.0 standard drinks of alcohol    Comment: "social drinker"    Drug use: No    ALLERGIES:  is allergic to penicillins.  MEDICATIONS:   Current Outpatient Medications  Medication Sig Dispense Refill   furosemide  (LASIX ) 20 MG tablet TAKE 1 TABLET (20 MG TOTAL) BY MOUTH DAILY AS NEEDED FOR SWELLING (Patient taking differently: Take 20 mg by mouth daily.) 90 tablet 1   Multiple Vitamin (MULTIVITAMIN) capsule Take 1 capsule by mouth daily. Reported on 02/24/2016     NON FORMULARY Relief Factor with vitamin D and Turmeric     spironolactone  (ALDACTONE ) 25 MG tablet TAKE 1 TABLET (25 MG TOTAL) BY MOUTH DAILY. 90 tablet 3   No current facility-administered medications for this visit.   Facility-Administered Medications Ordered in Other Visits  Medication Dose Route Frequency Provider Last Rate Last Admin   heparin  lock flush 100 unit/mL  500 Units Intravenous Once Nashae Maudlin R, MD       sodium chloride  0.9 % injection 10 mL  10 mL Intracatheter PRN Rufus Council, MD   10 mL at 05/27/15 1543   sodium chloride  0.9 % injection 10 mL  10 mL Intracatheter PRN Rufus Council, MD   10 mL at 06/17/15 1510   sodium chloride  0.9 % injection 10 mL  10 mL Intravenous PRN Malone Sear, MD   10 mL at 07/08/15 1340   sodium chloride  flush (NS) 0.9 % injection 10 mL  10 mL Intravenous PRN Nelda Balsam, NP   10 mL at 09/30/20 1412    PHYSICAL EXAMINATION: ECOG PERFORMANCE STATUS: 0 - Asymptomatic  BP (!) 131/98 (BP Location: Left Arm, Patient Position: Sitting, Cuff Size: Large) Comment: pt told bp elevated, keep check at home, contact pcp if con't to stay elevated  Pulse 77   Temp 97.7 F (36.5 C) (Tympanic)   Resp 16   Ht 5\' 10"  (1.778 m)   Wt 196 lb 1.6 oz (89 kg)   SpO2 100%   BMI 28.14 kg/m   Filed Weights   04/29/24 1301  Weight: 196 lb 1.6 oz (89 kg)      Physical Exam HENT:     Head: Normocephalic and atraumatic.     Mouth/Throat:     Pharynx: No oropharyngeal exudate.  Eyes:     Pupils: Pupils are equal, round, and reactive to light.  Cardiovascular:     Rate and Rhythm: Normal rate and regular  rhythm.  Pulmonary:     Effort: No respiratory distress.     Breath sounds: No wheezing.  Abdominal:     General: Bowel sounds are normal. There is no distension.     Palpations: Abdomen is soft. There is no mass.     Tenderness: There is no abdominal tenderness. There is no guarding or rebound.  Musculoskeletal:        General: No tenderness. Normal range of motion.     Cervical back: Normal range of motion and neck supple.     Comments: Grade 1 swelling in the legs bilaterally.  Skin:    General: Skin is warm.  Neurological:     Mental Status: He is alert and oriented to person, place, and time.  Psychiatric:  Mood and Affect: Affect normal.      LABORATORY DATA:  I have reviewed the data as listed    Component Value Date/Time   NA 131 (L) 04/29/2024 1240   NA 137 06/19/2021 1121   NA 138 12/10/2014 1341   K 3.9 04/29/2024 1240   K 3.6 12/10/2014 1341   CL 96 (L) 04/29/2024 1240   CL 102 12/10/2014 1341   CO2 26 04/29/2024 1240   CO2 30 12/10/2014 1341   GLUCOSE 134 (H) 04/29/2024 1240   GLUCOSE 160 (H) 12/10/2014 1341   BUN 15 04/29/2024 1240   BUN 18 06/19/2021 1121   BUN 21 (H) 12/10/2014 1341   CREATININE 0.91 04/29/2024 1240   CREATININE 1.10 03/25/2015 1453   CALCIUM 8.9 04/29/2024 1240   CALCIUM 8.4 (L) 12/10/2014 1341   PROT 7.5 04/29/2024 1240   PROT 7.3 06/19/2021 1121   PROT 6.6 12/10/2014 1341   ALBUMIN 4.3 04/29/2024 1240   ALBUMIN 4.5 06/19/2021 1121   ALBUMIN 3.4 12/10/2014 1341   AST 32 04/29/2024 1240   ALT 23 04/29/2024 1240   ALT 23 12/10/2014 1341   ALKPHOS 37 (L) 04/29/2024 1240   ALKPHOS 50 12/10/2014 1341   BILITOT 1.1 04/29/2024 1240   GFRNONAA >60 04/29/2024 1240   GFRNONAA >60 03/25/2015 1453   GFRAA >60 08/19/2020 1324   GFRAA >60 03/25/2015 1453    No results found for: "SPEP", "UPEP"  Lab Results  Component Value Date   WBC 4.7 04/29/2024   NEUTROABS 2.7 04/29/2024   HGB 15.1 04/29/2024   HCT 43.7 04/29/2024    MCV 91.2 04/29/2024   PLT 159 04/29/2024      Chemistry      Component Value Date/Time   NA 131 (L) 04/29/2024 1240   NA 137 06/19/2021 1121   NA 138 12/10/2014 1341   K 3.9 04/29/2024 1240   K 3.6 12/10/2014 1341   CL 96 (L) 04/29/2024 1240   CL 102 12/10/2014 1341   CO2 26 04/29/2024 1240   CO2 30 12/10/2014 1341   BUN 15 04/29/2024 1240   BUN 18 06/19/2021 1121   BUN 21 (H) 12/10/2014 1341   CREATININE 0.91 04/29/2024 1240   CREATININE 1.10 03/25/2015 1453      Component Value Date/Time   CALCIUM 8.9 04/29/2024 1240   CALCIUM 8.4 (L) 12/10/2014 1341   ALKPHOS 37 (L) 04/29/2024 1240   ALKPHOS 50 12/10/2014 1341   AST 32 04/29/2024 1240   ALT 23 04/29/2024 1240   ALT 23 12/10/2014 1341   BILITOT 1.1 04/29/2024 1240       RADIOGRAPHIC STUDIES: I have personally reviewed the radiological images as listed and agreed with the findings in the report. No results found.   ASSESSMENT & PLAN:  Cancer of hilus of left lung (HCC) # Adenocarcinoma the lung; stage IV;  CT CAP- MAY 20th, 2025- Unchanged, flattened scarring of the medial left lung base with associated trace pleural effusion. Similar, scattered irregular and fine nodular opacity in the azygoesophageal recess of the right lower lobe and perihilar right middle lobe, residua of nonspecific infection or inflammation. No evidence of lymphadenopathy or metastatic disease in the chest, abdomen, or pelvis.  # Continue continued surveillance of any therapy at this time.  Otherwise labs adequate. I reviewed the above findings with the patient in detail.  Discussed that repeat scan in 6 months would be reasonable to assess/monitor. Patient is in agreement.    # bottom of feet and  palms of hands itchy- about 20 sec/ each day- intermittently- monitor for now. Hold off derm referral.   # Thoracic aneurysm-approximately 4.5 cm in size; on surveillance; CT FEB 2024-4.7 cm.  S/p evaluation with 09/18/2022 Dr. Viviano Ground.    #  Chronic fluid retention/ venous stasis--as needed Lasix  Aldactone .  No worsening of renal function.- Stable.   # port malfunction: fibrin sheath [2022] s/p TPA-; dysfunction noted- OK today. Stable.  Discussed regarding port explantation-await next imaging.  # Prostatomegaly: no symtoms; awaiting PSA today  #Incidental findings on Imaging  CT , 2025:Aortic Atherosclerosis.  I reviewed/discussed/counseled the patient.   b12 -12/01; q9W;    # DISPOSITION:  # port flush in 3 months  # Follow up 6 months-  Fridays-  MD-port flush- labs- cbc/cmp; Magnesium; B12;  CT CAP prior- - Dr.B  # I reviewed the blood work- with the patient in detail; also reviewed the imaging independently [as summarized above]; and with the patient in detail.     Orders Placed This Encounter  Procedures   CT CHEST ABDOMEN PELVIS W CONTRAST    Standing Status:   Future    Expected Date:   07/30/2024    Expiration Date:   04/29/2025    If indicated for the ordered procedure, I authorize the administration of contrast media per Radiology protocol:   Yes    Does the patient have a contrast media/X-ray dye allergy?:   No    Preferred imaging location?:   [facility]    If indicated for the ordered procedure, I authorize the administration of oral contrast media per Radiology protocol:   Yes   CBC with Differential (Cancer Center Only)    Standing Status:   Future    Expected Date:   10/30/2024    Expiration Date:   04/29/2025   CMP (Cancer Center only)    Standing Status:   Future    Expected Date:   10/30/2024    Expiration Date:   04/29/2025   Magnesium    Standing Status:   Future    Expected Date:   10/30/2024    Expiration Date:   04/29/2025   Vitamin B12    Standing Status:   Future    Expected Date:   10/30/2024    Expiration Date:   04/29/2025     All questions were answered. The patient knows to call the clinic with any problems, questions or concerns.      Gwyn Leos, MD 04/29/2024 1:46  PM

## 2024-04-29 NOTE — Progress Notes (Signed)
 CT CAP 04/20/24.  Pt wants to know if he needs to stay on folic acid , if so, needs refill, pended.  C/o bottom of feet and palms of hands itchy. Uses Cetaphil.  Pt states he is taking his lasix  every day. No refill needed.

## 2024-04-29 NOTE — Assessment & Plan Note (Addendum)
#   Adenocarcinoma the lung; stage IV;  CT CAP- MAY 20th, 2025- Unchanged, flattened scarring of the medial left lung base with associated trace pleural effusion. Similar, scattered irregular and fine nodular opacity in the azygoesophageal recess of the right lower lobe and perihilar right middle lobe, residua of nonspecific infection or inflammation. No evidence of lymphadenopathy or metastatic disease in the chest, abdomen, or pelvis.  # Continue continued surveillance of any therapy at this time.  Otherwise labs adequate. I reviewed the above findings with the patient in detail.  Discussed that repeat scan in 6 months would be reasonable to assess/monitor. Patient is in agreement.    # bottom of feet and palms of hands itchy- about 20 sec/ each day- intermittently- monitor for now. Hold off derm referral.   # Thoracic aneurysm-approximately 4.5 cm in size; on surveillance; CT FEB 2024-4.7 cm.  S/p evaluation with 09/18/2022 Dr. Viviano Ground.    # Chronic fluid retention/ venous stasis--as needed Lasix  Aldactone .  No worsening of renal function.- Stable.   # port malfunction: fibrin sheath [2022] s/p TPA-; dysfunction noted- OK today. Stable.  Discussed regarding port explantation-await next imaging.  # Prostatomegaly: no symtoms; awaiting PSA today  #Incidental findings on Imaging  CT , 2025:Aortic Atherosclerosis.  I reviewed/discussed/counseled the patient.   b12 -12/01; q9W;    # DISPOSITION:  # port flush in 3 months  # Follow up 6 months-  Fridays-  MD-port flush- labs- cbc/cmp; Magnesium; B12;  CT CAP prior- - Dr.B  # I reviewed the blood work- with the patient in detail; also reviewed the imaging independently [as summarized above]; and with the patient in detail.

## 2024-04-30 ENCOUNTER — Other Ambulatory Visit: Payer: Self-pay

## 2024-05-16 ENCOUNTER — Other Ambulatory Visit: Payer: Self-pay

## 2024-07-10 ENCOUNTER — Other Ambulatory Visit: Payer: Self-pay | Admitting: Neurology

## 2024-07-10 DIAGNOSIS — Z1331 Encounter for screening for depression: Secondary | ICD-10-CM

## 2024-07-10 DIAGNOSIS — M545 Low back pain, unspecified: Secondary | ICD-10-CM

## 2024-07-10 DIAGNOSIS — M9904 Segmental and somatic dysfunction of sacral region: Secondary | ICD-10-CM

## 2024-07-14 ENCOUNTER — Ambulatory Visit
Admission: RE | Admit: 2024-07-14 | Discharge: 2024-07-14 | Disposition: A | Discharge status: Home / Self Care | Source: Ambulatory Visit | Attending: Neurology | Admitting: Neurology

## 2024-07-14 DIAGNOSIS — M9904 Segmental and somatic dysfunction of sacral region: Secondary | ICD-10-CM | POA: Diagnosis present

## 2024-07-14 DIAGNOSIS — M545 Low back pain, unspecified: Secondary | ICD-10-CM | POA: Diagnosis present

## 2024-07-14 DIAGNOSIS — G8929 Other chronic pain: Secondary | ICD-10-CM | POA: Diagnosis present

## 2024-07-14 DIAGNOSIS — Z1331 Encounter for screening for depression: Secondary | ICD-10-CM | POA: Insufficient documentation

## 2024-07-30 ENCOUNTER — Inpatient Hospital Stay: Attending: Internal Medicine

## 2024-07-30 DIAGNOSIS — Z85118 Personal history of other malignant neoplasm of bronchus and lung: Secondary | ICD-10-CM | POA: Diagnosis present

## 2024-07-30 DIAGNOSIS — Z452 Encounter for adjustment and management of vascular access device: Secondary | ICD-10-CM | POA: Insufficient documentation

## 2024-08-26 ENCOUNTER — Other Ambulatory Visit: Payer: Self-pay | Admitting: Internal Medicine

## 2024-08-27 ENCOUNTER — Encounter: Payer: Self-pay | Admitting: Internal Medicine

## 2024-09-01 ENCOUNTER — Encounter: Payer: Self-pay | Admitting: Internal Medicine

## 2024-09-15 ENCOUNTER — Ambulatory Visit (INDEPENDENT_AMBULATORY_CARE_PROVIDER_SITE_OTHER): Payer: BC Managed Care – PPO | Admitting: Vascular Surgery

## 2024-10-05 ENCOUNTER — Ambulatory Visit

## 2024-10-05 ENCOUNTER — Ambulatory Visit
Admission: RE | Admit: 2024-10-05 | Discharge: 2024-10-05 | Disposition: A | Discharge status: Home / Self Care | Attending: Gastroenterology | Admitting: Gastroenterology

## 2024-10-05 ENCOUNTER — Encounter: Payer: Self-pay | Admitting: Gastroenterology

## 2024-10-05 ENCOUNTER — Encounter
Admission: RE | Disposition: A | Discharge status: Home / Self Care | Payer: Self-pay | Source: Home / Self Care | Attending: Gastroenterology

## 2024-10-05 DIAGNOSIS — D125 Benign neoplasm of sigmoid colon: Secondary | ICD-10-CM | POA: Diagnosis not present

## 2024-10-05 DIAGNOSIS — D128 Benign neoplasm of rectum: Secondary | ICD-10-CM | POA: Diagnosis not present

## 2024-10-05 DIAGNOSIS — K640 First degree hemorrhoids: Secondary | ICD-10-CM | POA: Diagnosis not present

## 2024-10-05 DIAGNOSIS — K625 Hemorrhage of anus and rectum: Secondary | ICD-10-CM | POA: Insufficient documentation

## 2024-10-05 DIAGNOSIS — K760 Fatty (change of) liver, not elsewhere classified: Secondary | ICD-10-CM | POA: Diagnosis not present

## 2024-10-05 HISTORY — PX: POLYPECTOMY: SHX149

## 2024-10-05 HISTORY — PX: COLONOSCOPY: SHX5424

## 2024-10-05 SURGERY — COLONOSCOPY
Anesthesia: General

## 2024-10-05 MED ORDER — LIDOCAINE HCL (PF) 2 % IJ SOLN
INTRAMUSCULAR | Status: AC
  Filled 2024-10-05: qty 5

## 2024-10-05 MED ORDER — SODIUM CHLORIDE 0.9 % IV SOLN: INTRAVENOUS | Status: DC

## 2024-10-05 MED ORDER — PROPOFOL 10 MG/ML IV BOLUS
INTRAVENOUS | Status: AC
  Filled 2024-10-05: qty 20

## 2024-10-05 MED ORDER — LIDOCAINE HCL (CARDIAC) PF 100 MG/5ML IV SOSY
PREFILLED_SYRINGE | INTRAVENOUS | Status: DC | PRN
  Administered 2024-10-05: 100 mg via INTRAVENOUS

## 2024-10-05 MED ORDER — PROPOFOL 10 MG/ML IV BOLUS
INTRAVENOUS | Status: DC | PRN
  Administered 2024-10-05: 100 mg via INTRAVENOUS

## 2024-10-05 MED ORDER — PROPOFOL 500 MG/50ML IV EMUL
INTRAVENOUS | Status: DC | PRN
  Administered 2024-10-05: 150 ug/kg/min via INTRAVENOUS

## 2024-10-05 NOTE — Transfer of Care (Signed)
 Immediate Anesthesia Transfer of Care Note  Patient: Mitchell Mosley  Procedure(s) Performed: COLONOSCOPY POLYPECTOMY, INTESTINE  Patient Location: Endoscopy Unit  Anesthesia Type:General  Level of Consciousness: sedated and drowsy  Airway & Oxygen Therapy: Patient Spontanous Breathing  Post-op Assessment: Report given to RN and Post -op Vital signs reviewed and stable  Post vital signs: Reviewed and stable  Last Vitals:  Vitals Value Taken Time  BP 92/52 1017  Temp    Pulse 63 10/05/24 10:17  Resp 15 10/05/24 10:17  SpO2 100 % 10/05/24 10:17  Vitals shown include unfiled device data.  Last Pain:  Vitals:   10/05/24 0939  TempSrc: Temporal  PainSc: 0-No pain         Complications: No notable events documented.

## 2024-10-05 NOTE — Interval H&P Note (Signed)
 History and Physical Interval Note: Preprocedure H&P from 10/05/24  was reviewed and there was no interval change after seeing and examining the patient.  Written consent was obtained from the patient after discussion of risks, benefits, and alternatives. Patient has consented to proceed with Colonoscopy with possible intervention   10/05/2024 9:47 AM  Mitchell Mosley  has presented today for surgery, with the diagnosis of Rectal bleeding (K62.5).  The various methods of treatment have been discussed with the patient and family. After consideration of risks, benefits and other options for treatment, the patient has consented to  Procedure(s): COLONOSCOPY (N/A) as a surgical intervention.  The patient's history has been reviewed, patient examined, no change in status, stable for surgery.  I have reviewed the patient's chart and labs.  Questions were answered to the patient's satisfaction.     [name]

## 2024-10-05 NOTE — Anesthesia Preprocedure Evaluation (Signed)
 Anesthesia Evaluation  Patient identified by MRN, date of birth, ID band Patient awake    Reviewed: Allergy & Precautions, NPO status , Patient's Chart, lab work & pertinent test results  History of Anesthesia Complications Negative for: history of anesthetic complications  Airway Mallampati: III  TM Distance: >3 FB Neck ROM: full    Dental  (+) Chipped   Pulmonary neg pulmonary ROS   Pulmonary exam normal        Cardiovascular Normal cardiovascular exam+ Valvular Problems/Murmurs      Neuro/Psych negative neurological ROS  negative psych ROS   GI/Hepatic negative GI ROS, Neg liver ROS,,,  Endo/Other  negative endocrine ROS    Renal/GU negative Renal ROS  negative genitourinary   Musculoskeletal   Abdominal   Peds  Hematology negative hematology ROS (+)   Anesthesia Other Findings Past Medical History: No date: Allergy No date: Lung cancer [facility]) Nov. 2014: Mild valvular heart disease     Comment:  per 2 D Echocardiogram done for persistent leg edema,               monitored by cardiology No date: Peripheral edema No date: Thoracic aortic aneurysm (TAA)  Past Surgical History: 2011: ELBOW SURGERY; Right 09/11/2021: IR CV LINE INJECTION March 2012: Left anterior thoracotomy with biopsy March 2012: PORTACATH PLACEMENT as child: TONSILLECTOMY  BMI    Body Mass Index: 28.70 kg/m      Reproductive/Obstetrics negative OB ROS                              Anesthesia Physical Anesthesia Plan  ASA: 2  Anesthesia Plan: General   Post-op Pain Management: Minimal or no pain anticipated   Induction: Intravenous  PONV Risk Score and Plan: 1 and Propofol infusion and TIVA  Airway Management Planned: Natural Airway and Nasal Cannula  Additional Equipment:   Intra-op Plan:   Post-operative Plan:   Informed Consent: I have reviewed the patients History and Physical, chart,  labs and discussed the procedure including the risks, benefits and alternatives for the proposed anesthesia with the patient or authorized representative who has indicated his/her understanding and acceptance.     Dental Advisory Given  Plan Discussed with: Anesthesiologist, CRNA and Surgeon  Anesthesia Plan Comments: (Patient consented for risks of anesthesia including but not limited to:  - adverse reactions to medications - risk of airway placement if required - damage to eyes, teeth, lips or other oral mucosa - nerve damage due to positioning  - sore throat or hoarseness - Damage to heart, brain, nerves, lungs, other parts of body or loss of life  Patient voiced understanding and assent.)        Anesthesia Quick Evaluation

## 2024-10-05 NOTE — Anesthesia Postprocedure Evaluation (Signed)
 Anesthesia Post Note  Patient: Mitchell Mosley  Procedure(s) Performed: COLONOSCOPY POLYPECTOMY, INTESTINE  Patient location during evaluation: Endoscopy Anesthesia Type: General Level of consciousness: awake and alert Pain management: pain level controlled Vital Signs Assessment: post-procedure vital signs reviewed and stable Respiratory status: spontaneous breathing, nonlabored ventilation, respiratory function stable and patient connected to nasal cannula oxygen Cardiovascular status: blood pressure returned to baseline and stable Postop Assessment: no apparent nausea or vomiting Anesthetic complications: no   No notable events documented.   Last Vitals:  Vitals:   10/05/24 1027 10/05/24 1037  BP: 104/67 (!) 140/74  Pulse: 63 60  Resp: (!) 26 16  Temp:    SpO2: 100% 100%    Last Pain:  Vitals:   10/05/24 1037  TempSrc:   PainSc: 0-No pain                 Lendia LITTIE Mae

## 2024-10-05 NOTE — H&P (Signed)
 Pre-Procedure H&P   Patient ID: Mitchell Mosley is a 63 y.o. male.  Gastroenterology Provider: [name], DO  Referring Provider: Romero Antigua, PA PCP: Bertrum Charlie CROME, MD  Date: 10/05/2024  HPI [name] is a 63 y.o. male who presents today for Colonoscopy for Rectal bleeding .  Patient experienced 1 episode of bleeding with abdominal discomfort in May 2025.  No further events.  Reports daily bowel movement without further melena hematochezia diarrhea or constipation.  Recent CT was unremarkable aside from some fatty liver disease No previous colonoscopy Hemoglobin 15.1 MCV 91 platelets 159,000   Past Medical History:  Diagnosis Date   Allergy    Lung cancer (HCC)    Mild valvular heart disease Nov. 2014   per 2 D Echocardiogram done for persistent leg edema, monitored by cardiology   Peripheral edema    Thoracic aortic aneurysm (TAA)     Past Surgical History:  Procedure Laterality Date   ELBOW SURGERY Right 2011   IR CV LINE INJECTION  09/11/2021   Left anterior thoracotomy with biopsy  March 2012   PORTACATH PLACEMENT  March 2012   TONSILLECTOMY  as child    Family History No h/o GI disease or malignancy  Review of Systems  Constitutional:  Negative for activity change, appetite change, chills, diaphoresis, fatigue, fever and unexpected weight change.  HENT:  Negative for trouble swallowing and voice change.   Respiratory:  Negative for shortness of breath and wheezing.   Cardiovascular:  Negative for chest pain, palpitations and leg swelling.  Gastrointestinal:  Positive for anal bleeding. Negative for abdominal distention, abdominal pain, blood in stool, constipation, diarrhea, nausea and vomiting.  Musculoskeletal:  Negative for arthralgias and myalgias.  Skin:  Negative for color change and pallor.  Neurological:  Negative for dizziness, syncope and weakness.  Psychiatric/Behavioral:  Negative for confusion. The patient is not  nervous/anxious.   All other systems reviewed and are negative.    Medications Current Facility-Administered Medications on File Prior to Encounter  Medication Dose Route Frequency Provider Last Rate Last Admin   heparin  lock flush 100 unit/mL  500 Units Intravenous Once Brahmanday, Govinda R, MD       sodium chloride  0.9 % injection 10 mL  10 mL Intracatheter PRN Marina Cruise, MD   10 mL at 05/27/15 1543   sodium chloride  0.9 % injection 10 mL  10 mL Intracatheter PRN Marina Cruise, MD   10 mL at 06/17/15 1510   sodium chloride  0.9 % injection 10 mL  10 mL Intravenous PRN Wilder Pink, MD   10 mL at 07/08/15 1340   sodium chloride  flush (NS) 0.9 % injection 10 mL  10 mL Intravenous PRN Dasie Tinnie MATSU, NP   10 mL at 09/30/20 1412   Current Outpatient Medications on File Prior to Encounter  Medication Sig Dispense Refill   furosemide  (LASIX ) 20 MG tablet TAKE 1 TABLET (20 MG TOTAL) BY MOUTH DAILY AS NEEDED FOR SWELLING 90 tablet 1   Multiple Vitamin (MULTIVITAMIN) capsule Take 1 capsule by mouth daily. Reported on 02/24/2016     NON FORMULARY Relief Factor with vitamin D and Turmeric     spironolactone  (ALDACTONE ) 25 MG tablet TAKE 1 TABLET (25 MG TOTAL) BY MOUTH DAILY. 90 tablet 3    Pertinent medications related to GI and procedure were reviewed by me with the patient prior to the procedure   Current Facility-Administered Medications:    0.9 %  sodium chloride  infusion, ,  Intravenous, Continuous, Onita Elspeth Sharper, DO, Last Rate: 20 mL/hr at 10/05/24 0946, New Bag at 10/05/24 0946  Facility-Administered Medications Ordered in Other Encounters:    heparin  lock flush 100 unit/mL, 500 Units, Intravenous, Once, Brahmanday, Govinda R, MD   sodium chloride  0.9 % injection 10 mL, 10 mL, Intracatheter, PRN, Marina Cruise, MD, 10 mL at 05/27/15 1543   sodium chloride  0.9 % injection 10 mL, 10 mL, Intracatheter, PRN, Marina Cruise, MD, 10 mL at 06/17/15 1510   sodium chloride  0.9  % injection 10 mL, 10 mL, Intravenous, PRN, Choksi, Janak, MD, 10 mL at 07/08/15 1340   sodium chloride  flush (NS) 0.9 % injection 10 mL, 10 mL, Intravenous, PRN, Dasie Tinnie MATSU, NP, 10 mL at 09/30/20 1412  sodium chloride  20 mL/hr at 10/05/24 0946       Allergies  Allergen Reactions   Penicillins     Reaction and severity unknown   Allergies were reviewed by me prior to the procedure  Objective   Body mass index is 28.7 kg/m. Vitals:   10/05/24 0926 10/05/24 0939  BP:  (!) 146/91  Pulse:  71  Resp:  18  Temp:  (!) 96.3 F (35.7 C)  TempSrc:  Temporal  SpO2:  100%  Weight: 90.7 kg   Height: 5' 10 (1.778 m)      Physical Exam Vitals and nursing note reviewed.  Constitutional:      General: He is not in acute distress.    Appearance: Normal appearance. He is not ill-appearing, toxic-appearing or diaphoretic.  HENT:     Head: Normocephalic and atraumatic.     Nose: Nose normal.     Mouth/Throat:     Mouth: Mucous membranes are moist.     Pharynx: Oropharynx is clear.  Eyes:     General: No scleral icterus.    Extraocular Movements: Extraocular movements intact.  Cardiovascular:     Rate and Rhythm: Normal rate and regular rhythm.     Heart sounds: Normal heart sounds. No murmur heard.    No friction rub. No gallop.  Pulmonary:     Effort: Pulmonary effort is normal. No respiratory distress.     Breath sounds: Normal breath sounds. No wheezing, rhonchi or rales.  Abdominal:     General: Bowel sounds are normal. There is no distension.     Palpations: Abdomen is soft.     Tenderness: There is no abdominal tenderness. There is no guarding or rebound.  Musculoskeletal:     Cervical back: Neck supple.     Right lower leg: No edema.     Left lower leg: No edema.  Skin:    General: Skin is warm and dry.     Coloration: Skin is not jaundiced or pale.  Neurological:     General: No focal deficit present.     Mental Status: He is alert and oriented to person,  place, and time. Mental status is at baseline.  Psychiatric:        Mood and Affect: Mood normal.        Behavior: Behavior normal.        Thought Content: Thought content normal.        Judgment: Judgment normal.      Assessment:  [name] is a 63 y.o. male  who presents today for Colonoscopy for Rectal bleeding .  Plan:  Colonoscopy with possible intervention today  Colonoscopy with possible biopsy, control of bleeding, polypectomy, and interventions as necessary has  been discussed with the patient/patient representative. Informed consent was obtained from the patient/patient representative after explaining the indication, nature, and risks of the procedure including but not limited to death, bleeding, perforation, missed neoplasm/lesions, cardiorespiratory compromise, and reaction to medications. Opportunity for questions was given and appropriate answers were provided. Patient/patient representative has verbalized understanding is amenable to undergoing the procedure.   [name], DO  [facility] Gastroenterology  Portions of the record may have been created with voice recognition software. Occasional wrong-word or 'sound-a-like' substitutions may have occurred due to the inherent limitations of voice recognition software.  Read the chart carefully and recognize, using context, where substitutions may have occurred.

## 2024-10-05 NOTE — Op Note (Signed)
 [facility] Gastroenterology Patient Name: Mitchell Mosley Procedure Date: 10/05/2024 9:50 AM MRN: [id number] Account #: [id number] Date of Birth: 1961/04/03 Admit Type: Outpatient Age: 63 Room: [facility] ENDO ROOM 2 Gender: Male Note Status: Finalized Instrument Name: Colon Scope [id number] Procedure:             Colonoscopy Indications:           Rectal bleeding Providers:             Elspeth Ozell Onita ROSALEA, DO Referring MD:          Elspeth Ozell Onita DO, DO (Referring MD), Charlie CROME.                         Bertrum, MD (Referring MD) Medicines:             Monitored Anesthesia Care Complications:         No immediate complications. Estimated blood loss:                         Minimal. Procedure:             Pre-Anesthesia Assessment:                        - Prior to the procedure, a History and Physical was                         performed, and patient medications and allergies were                         reviewed. The patient is competent. The risks and                         benefits of the procedure and the sedation options and                         risks were discussed with the patient. All questions                         were answered and informed consent was obtained.                         Patient identification and proposed procedure were                         verified by the physician, the nurse, the anesthetist                         and the technician in the endoscopy suite. Mental                         Status Examination: alert and oriented. Airway                         Examination: normal oropharyngeal airway and neck                         mobility. Respiratory Examination: clear to  auscultation. CV Examination: RRR, no murmurs, no S3                         or S4. Prophylactic Antibiotics: The patient does not                         require prophylactic antibiotics. Prior                          Anticoagulants: The patient has taken no anticoagulant                         or antiplatelet agents. ASA Grade Assessment: II - A                         patient with mild systemic disease. After reviewing                         the risks and benefits, the patient was deemed in                         satisfactory condition to undergo the procedure. The                         anesthesia plan was to use monitored anesthesia care                         (MAC). Immediately prior to administration of                         medications, the patient was re-assessed for adequacy                         to receive sedatives. The heart rate, respiratory                         rate, oxygen saturations, blood pressure, adequacy of                         pulmonary ventilation, and response to care were                         monitored throughout the procedure. The physical                         status of the patient was re-assessed after the                         procedure.                        After obtaining informed consent, the colonoscope was                         passed under direct vision. Throughout the procedure,                         the patient's blood pressure, pulse, and oxygen  saturations were monitored continuously. The                         Colonoscope was introduced through the anus and                         advanced to the the terminal ileum, with                         identification of the appendiceal orifice and IC                         valve. The colonoscopy was performed without                         difficulty. The patient tolerated the procedure well.                         The quality of the bowel preparation was evaluated                         using the BBPS [facility] Bowel Preparation Scale) with                         scores of: Right Colon = 3, Transverse Colon = 3 and                         Left Colon = 3 (entire  mucosa seen well with no                         residual staining, small fragments of stool or opaque                         liquid). The total BBPS score equals 9. The terminal                         ileum, ileocecal valve, appendiceal orifice, and                         rectum were photographed. Findings:      The perianal and digital rectal examinations were normal. Pertinent       negatives include normal sphincter tone.      The terminal ileum appeared normal. Estimated blood loss: none.      A 1 to 2 mm polyp was found in the sigmoid colon. The polyp was sessile.       The polyp was removed with a jumbo cold forceps. Resection and retrieval       were complete. Estimated blood loss was minimal.      A 4 to 5 mm polyp was found in the rectum. The polyp was sessile. The       polyp was removed with a cold snare. Resection and retrieval were       complete. Estimated blood loss was minimal.      Non-bleeding internal hemorrhoids were found during retroflexion. The       hemorrhoids were Grade I (internal hemorrhoids that do not prolapse).       Estimated blood loss: none.  The exam was otherwise without abnormality on direct and retroflexion       views. Impression:            - The examined portion of the ileum was normal.                        - One 1 to 2 mm polyp in the sigmoid colon, removed                         with a jumbo cold forceps. Resected and retrieved.                        - One 4 to 5 mm polyp in the rectum, removed with a                         cold snare. Resected and retrieved.                        - Non-bleeding internal hemorrhoids.                        - The examination was otherwise normal on direct and                         retroflexion views. Recommendation:        - Patient has a contact number available for                         emergencies. The signs and symptoms of potential                         delayed complications were  discussed with the patient.                         Return to normal activities tomorrow. Written                         discharge instructions were provided to the patient.                        - Discharge patient to home.                        - Resume previous diet.                        - Continue present medications.                        - No ibuprofen, naproxen, or other non-steroidal                         anti-inflammatory drugs for 5 days after polyp removal.                        - Await pathology results.                        - Repeat colonoscopy for surveillance based on  pathology results.                        - Return to referring physician as previously                         scheduled.                        - The findings and recommendations were discussed with                         the patient. Procedure Code(s):     --- Professional ---                        [phone], Colonoscopy, flexible; with removal of                         tumor(s), polyp(s), or other lesion(s) by snare                         technique                        45380, 59, Colonoscopy, flexible; with biopsy, single                         or multiple Diagnosis Code(s):     --- Professional ---                        K64.0, First degree hemorrhoids                        D12.5, Benign neoplasm of sigmoid colon                        D12.8, Benign neoplasm of rectum CPT copyright 2022 American Medical Association. All rights reserved. The codes documented in this report are preliminary and upon coder review may  be revised to meet current compliance requirements. Attending Participation:      I personally performed the entire procedure. Elspeth Jungling, DO [name] DO, DO 10/05/2024 10:16:58 AM This report has been signed electronically. Number of Addenda: 0 Note Initiated On: 10/05/2024 9:50 AM Scope Withdrawal Time: 0 hours 15 minutes 15 seconds   Total Procedure Duration: 0 hours 17 minutes 57 seconds  Estimated Blood Loss:  Estimated blood loss was minimal.      [facility]

## 2024-10-06 LAB — SURGICAL PATHOLOGY

## 2024-10-16 ENCOUNTER — Telehealth: Payer: Self-pay | Admitting: Internal Medicine

## 2024-10-16 NOTE — Telephone Encounter (Signed)
 Called pt to confirm CT appt - left vm w/appt details - LH

## 2024-10-20 ENCOUNTER — Other Ambulatory Visit

## 2024-10-23 ENCOUNTER — Ambulatory Visit
Admission: RE | Admit: 2024-10-23 | Discharge: 2024-10-23 | Disposition: A | Discharge status: Home / Self Care | Source: Ambulatory Visit | Attending: Internal Medicine | Admitting: Internal Medicine

## 2024-10-23 DIAGNOSIS — C3402 Malignant neoplasm of left main bronchus: Secondary | ICD-10-CM

## 2024-10-23 MED ORDER — IOPAMIDOL (ISOVUE-300) INJECTION 61%
100.0000 mL | Freq: Once | INTRAVENOUS | Status: AC | PRN
  Administered 2024-10-23: 100 mL via INTRAVENOUS

## 2024-10-27 ENCOUNTER — Encounter (INDEPENDENT_AMBULATORY_CARE_PROVIDER_SITE_OTHER): Payer: Self-pay | Admitting: Vascular Surgery

## 2024-10-27 ENCOUNTER — Ambulatory Visit (INDEPENDENT_AMBULATORY_CARE_PROVIDER_SITE_OTHER): Admitting: Vascular Surgery

## 2024-10-27 VITALS — BP 122/83 | HR 92 | Resp 18 | Ht 70.0 in | Wt 204.0 lb

## 2024-10-27 DIAGNOSIS — C3492 Malignant neoplasm of unspecified part of left bronchus or lung: Secondary | ICD-10-CM | POA: Diagnosis not present

## 2024-10-27 NOTE — Progress Notes (Signed)
 MRN : [id number]  Mitchell Mosley is a 63 y.o. (1961/07/19) male who presents with chief complaint of  Chief Complaint  Patient presents with   Follow-up    1 year follow up no studies/CT results from oncology dept.   SABRA  History of Present Illness: Patient returns today in follow up of his thoracic aortic aneurysm.  He is followed with oncology for his lung cancer with twice yearly CT scans.  This was identified incidentally on those.  He does not have any aneurysm related symptoms such as back or chest pain or signs of peripheral embolization. I have independently reviewed his recent CT scan of the chest abdomen pelvis.  His ascending thoracic aorta is stable at 4.3 to 4.4 cm in maximal diameter. This has yet to be read by radiology, but I have reviewed this study.  This has not changed significantly from his previous studies earlier this year or last year.  No other aneurysmal disease or significant occlusive disease is seen in the aortoiliac segments.   History of Present Illness     Results   Current Outpatient Medications  Medication Sig Dispense Refill   furosemide  (LASIX ) 20 MG tablet TAKE 1 TABLET (20 MG TOTAL) BY MOUTH DAILY AS NEEDED FOR SWELLING 90 tablet 1   Multiple Vitamin (MULTIVITAMIN) capsule Take 1 capsule by mouth daily. Reported on 02/24/2016     NON FORMULARY Relief Factor with vitamin D and Turmeric     spironolactone  (ALDACTONE ) 25 MG tablet TAKE 1 TABLET (25 MG TOTAL) BY MOUTH DAILY. 90 tablet 3   No current facility-administered medications for this visit.   Facility-Administered Medications Ordered in Other Visits  Medication Dose Route Frequency Provider Last Rate Last Admin   heparin  lock flush 100 unit/mL  500 Units Intravenous Once Brahmanday, Govinda R, MD       sodium chloride  0.9 % injection 10 mL  10 mL Intracatheter PRN Marina Cruise, MD   10 mL at 05/27/15 1543   sodium chloride  0.9 % injection 10 mL  10 mL Intracatheter PRN Marina Cruise,  MD   10 mL at 06/17/15 1510   sodium chloride  0.9 % injection 10 mL  10 mL Intravenous PRN Wilder Pink, MD   10 mL at 07/08/15 1340   sodium chloride  flush (NS) 0.9 % injection 10 mL  10 mL Intravenous PRN Dasie Tinnie MATSU, NP   10 mL at 09/30/20 1412    Past Medical History:  Diagnosis Date   Allergy    Lung cancer (HCC)    Mild valvular heart disease Nov. 2014   per 2 D Echocardiogram done for persistent leg edema, monitored by cardiology   Peripheral edema    Thoracic aortic aneurysm (TAA)     Past Surgical History:  Procedure Laterality Date   COLONOSCOPY N/A 10/05/2024   Procedure: COLONOSCOPY;  Surgeon: Onita Elspeth Sharper, DO;  Location: [facility] ENDOSCOPY;  Service: Gastroenterology;  Laterality: N/A;   ELBOW SURGERY Right 2011   IR CV LINE INJECTION  09/11/2021   Left anterior thoracotomy with biopsy  March 2012   POLYPECTOMY  10/05/2024   Procedure: POLYPECTOMY, INTESTINE;  Surgeon: Onita Elspeth Sharper, DO;  Location: [facility] ENDOSCOPY;  Service: Gastroenterology;;   [facility] PLACEMENT  March 2012   TONSILLECTOMY  as child     Social History   Tobacco Use   Smoking status: Never   Smokeless tobacco: Never  Vaping Use   Vaping status: Never Used  Substance Use Topics  Alcohol use: Yes    Alcohol/week: 0.0 standard drinks of alcohol    Comment: social drinker    Drug use: No      Family History  Problem Relation Age of Onset   Leukemia Father    Diabetes Mother      Allergies  Allergen Reactions   Penicillins     Reaction and severity unknown      REVIEW OF SYSTEMS (Negative unless checked)   Constitutional: [] Weight loss  [] Fever  [] Chills Cardiac: [] Chest pain   [] Chest pressure   [] Palpitations   [] Shortness of breath when laying flat   [] Shortness of breath at rest   [] Shortness of breath with exertion. Vascular:  [x] Pain in legs with walking   [x] Pain in legs at rest   [] Pain in legs when laying flat   [x] Claudication   [] Pain in feet when  walking  [] Pain in feet at rest  [] Pain in feet when laying flat   [] History of DVT   [] Phlebitis   [x] Swelling in legs   [] Varicose veins   [] Non-healing ulcers Pulmonary:   [] Uses home oxygen   [] Productive cough   [] Hemoptysis   [] Wheeze  [] COPD   [] Asthma Neurologic:  [] Dizziness  [] Blackouts   [] Seizures   [] History of stroke   [] History of TIA  [] Aphasia   [] Temporary blindness   [] Dysphagia   [] Weakness or numbness in arms   [] Weakness or numbness in legs Musculoskeletal:  [] Arthritis   [] Joint swelling   [x] Joint pain   [] Low back pain Hematologic:  [] Easy bruising  [] Easy bleeding   [] Hypercoagulable state   [] Anemic   Gastrointestinal:  [] Blood in stool   [] Vomiting blood  [] Gastroesophageal reflux/heartburn   [] Abdominal pain Genitourinary:  [] Chronic kidney disease   [] Difficult urination  [] Frequent urination  [] Burning with urination   [] Hematuria Skin:  [] Rashes   [] Ulcers   [] Wounds Psychological:  [] History of anxiety   []  History of major depression.   Physical Examination  BP 122/83 (BP Location: Right Arm, Patient Position: Sitting, Cuff Size: Normal)   Pulse 92   Resp 18   Ht 5' 10 (1.778 m)   Wt 204 lb (92.5 kg)   BMI 29.27 kg/m  Gen:  WD/WN, NAD. Appears younger than stated age. Head: [place]/AT, No temporalis wasting. Ear/Nose/Throat: Hearing grossly intact, nares w/o erythema or drainage Eyes: Conjunctiva clear. Sclera non-icteric Neck: Supple.  Trachea midline Pulmonary:  Good air movement, no use of accessory muscles.  Cardiac: RRR, no JVD Vascular:  Vessel Right Left  Radial Palpable Palpable               Musculoskeletal: M/S 5/5 throughout.  No deformity or atrophy. No edema. Neurologic: Sensation grossly intact in extremities.  Symmetrical.  Speech is fluent.  Psychiatric: Judgment intact, Mood & affect appropriate for pt's clinical situation. Dermatologic: No rashes or ulcers noted.  No cellulitis or open wounds.  Physical Exam     Labs Recent  Results (from the past 2160 hours)  Surgical pathology     Status: None   Collection Time: 10/05/24 12:00 AM  Result Value Ref Range   SURGICAL PATHOLOGY      SURGICAL PATHOLOGY [facility] [address], [address] [place], [place] [address] Telephone [phone] or [phone] Fax [phone]  REPORT OF SURGICAL PATHOLOGY   Accession #: [id number] Patient Name: Mitchell Mosley, Mitchell Mosley Visit # : [id number]  MRN: [id number] Physician: Onita Standing DOB/Age 11-Mar-1961 (Age: 5) Gender: M Collected Date: 10/05/2024  Received Date: 10/05/2024  FINAL DIAGNOSIS       1. Sigmoid  Colon Polyp, cbx :       - TUBULAR ADENOMA, ONE FRAGMENT.NEGATIVE FOR HIGH GRADE DYSPLASIA OR MALIGNANCY.      - POLYPOID COLONIC MUCOSA, ONE FRAGMENT.  NEGATIVE FOR DYSPLASIA OR MALIGNANCY.       2. Rectum, polyp(s), cold snare :       - TUBULAR ADENOMA, ONE FRAGMENT.NEGATIVE FOR HIGH GRADE DYSPLASIA OR MALIGNANCY.       ELECTRONIC SIGNATURE : Jordan Md, [employer]chant [employer] Officer, Sports Administrator, International Aid/development Worker  MICROSCOPIC DESCRIPTION  CASE COMMENTS STAINS USED IN DIAGNOSIS: H&E H&E    CLINICAL HISTORY  SPECIMEN(S) OBTAINED 1. Si gmoid  Colon Polyp, Cbx 2. Rectum, polyp(s), Cold Snare  SPECIMEN COMMENTS: SPECIMEN CLINICAL INFORMATION: 1. Rectal bleeding    Gross Description 1. Received in formalin are tan, soft tissue fragments that are submitted in toto.Number:  two  Size:  0.3 cm, 1 block 2. Received in formalin is a tan, soft tissue fragment that is submitted in toto.Size:  0.5 cm, 1 block submitted.mb 10-05-24        Report signed out from the following location(s) [place] [address] [place], [place] [address] CLIA #: [id number]  [facility] [address] [place], [place] [address] CLIA #: [id number]     Radiology No results found.  Assessment/Plan   Thoracic aortic aneurysm (TAA) (HCC) I have independently reviewed  his recent CT scan of the chest abdomen pelvis.  His ascending thoracic aorta is stable at 4.3 to 4.4 cm in maximal diameter. This has yet to be read by radiology, but I have reviewed this study.  No other aneurysmal disease or significant occlusive disease is seen in the aortoiliac segments.  He is getting serial scans for his malignancy.  We can continue to follow him on an annual basis to review his CT scan for his a ascending thoracic aortic aneurysm.  No role for intervention at this size.   Lung cancer [facility]) Follows with oncology.  Has had resection and chemotherapy. Assessment & Plan      Selinda Gu, MD  10/27/2024 3:20 PM    This note was created with Dragon medical transcription system.  Any errors from dictation are purely unintentional

## 2024-10-28 ENCOUNTER — Other Ambulatory Visit: Payer: Self-pay

## 2024-11-06 ENCOUNTER — Encounter: Payer: Self-pay | Admitting: Internal Medicine

## 2024-11-06 ENCOUNTER — Inpatient Hospital Stay: Attending: Internal Medicine

## 2024-11-06 ENCOUNTER — Inpatient Hospital Stay: Admitting: Internal Medicine

## 2024-11-06 VITALS — BP 129/86 | HR 77 | Temp 97.0°F | Resp 18 | Ht 70.0 in | Wt 201.5 lb

## 2024-11-06 DIAGNOSIS — I878 Other specified disorders of veins: Secondary | ICD-10-CM | POA: Insufficient documentation

## 2024-11-06 DIAGNOSIS — I7121 Aneurysm of the ascending aorta, without rupture: Secondary | ICD-10-CM | POA: Diagnosis not present

## 2024-11-06 DIAGNOSIS — C3402 Malignant neoplasm of left main bronchus: Secondary | ICD-10-CM

## 2024-11-06 DIAGNOSIS — N400 Enlarged prostate without lower urinary tract symptoms: Secondary | ICD-10-CM | POA: Diagnosis not present

## 2024-11-06 DIAGNOSIS — R609 Edema, unspecified: Secondary | ICD-10-CM | POA: Diagnosis not present

## 2024-11-06 DIAGNOSIS — Z79899 Other long term (current) drug therapy: Secondary | ICD-10-CM | POA: Insufficient documentation

## 2024-11-06 DIAGNOSIS — Z85118 Personal history of other malignant neoplasm of bronchus and lung: Secondary | ICD-10-CM | POA: Diagnosis present

## 2024-11-06 DIAGNOSIS — I700 Atherosclerosis of aorta: Secondary | ICD-10-CM | POA: Insufficient documentation

## 2024-11-06 NOTE — Progress Notes (Signed)
 CT CAP 10/23/24.

## 2024-11-06 NOTE — Assessment & Plan Note (Signed)
#   Adenocarcinoma the lung; stage IV;  CT scan CT CAP- No evidence of recurrent or metastatic disease in the chest, abdomen, or pelvis.    # Continue continued surveillance of any therapy at this time.  Otherwise labs adequate. I reviewed the above findings with the patient in detail.  Discussed that repeat scan in 6 months would be reasonable to assess/monitor. Patient is in agreement.    # Thoracic aneurysm- Unchanged ascending thoracic aortic aneurysm measuring 4.6 cmUnchanged ascending thoracic aortic aneurysm measuring 4.6 cm.  S/p evaluation with 09/18/2022 Dr. Donne.    # Chronic fluid retention/ venous stasis--as needed Lasix  Aldactone .  No worsening of renal function.- Stable.   # port malfunction: fibrin sheath [2022] s/p TPA-; dysfunction noted- OK today. Stable.  Discussed regarding port explantation-await next imaging.  # Prostatomegaly: no symtoms; awaiting PSA today  #Incidental findings on Imaging  CT , 2025:Aortic Atherosclerosis.  I reviewed/discussed/counseled the patient.    # DISPOSITION:  # port flush in 3 months  # Follow up 6 months-  Fridays-  MD-port flush- labs- cbc/cmp; Magnesium; B12;  CT CAP prior- - Dr.B  # I reviewed the blood work- with the patient in detail; also reviewed the imaging independently [as summarized above]; and with the patient in detail.

## 2024-11-06 NOTE — Addendum Note (Signed)
 Addended by: JOSHUA ALFONSO CROME on: 11/06/2024 01:25 PM   Modules accepted: Orders

## 2024-11-06 NOTE — Progress Notes (Signed)
 [facility] OFFICE PROGRESS NOTE  Patient Care Team: Bertrum Charlie CROME, MD as PCP - General (Family Medicine) Rennie Cindy SAUNDERS, MD as Consulting Physician (Internal Medicine)   Cancer Staging  No matching staging information was found for the patient.    Oncology History Overview Note  # 2012- METASTATIC ADENO CA of LEFT LUNG [acinar pattern] s/p MED LN Bx [NEG- EGFR/K-ras/ALK mutation];PET 2012-neck/Chest/Chest wall/T1/Left Iliac on EliLilly protocol- Carbo-Alimta  x4; on Maint Alimta  [since June 2012]; CT JULY  2016- NED; JAN 2018- NED;   # AUG 2020-clinical trial closed; continue maintenance Alimta ; HELD JAN 2024- [leg cramps/leg swelling/worsening arthralgias/bilateral palm and soles itching].   # Thoracic aortic aneurysm-4.0 cm in 2016; currently 4.6 cm.  s/p referral to Dr. Luann.; MRI in 6 months planned.  ----------------------------------------------------------    DIAGNOSIS: CHARO.CHASE ] Adenocarcinoma lung  STAGE: 4       ;GOALS: Palliative  CURRENT/MOST RECENT THERAPY [ ]  Alimta  maintenance    Cancer of hilus of left lung (HCC)  07/20/2016 Initial Diagnosis   Cancer of hilus of left lung (HCC)   12/11/2019 - 06/29/2022 Chemotherapy   Patient is on Treatment Plan : LUNG Pemetrexed  (Alimta ) q21d      12/11/2019 -  Chemotherapy   Patient is on Treatment Plan : LUNG Pemetrexed  (500) q21d       INTERVAL HISTORY: Alone.  Ambulating independently.  Mitchell Mosley 63 y.o.  male pleasant patient above history of metastatic adenocarcinoma the lung-NED currently on surveillance off Alimta  [HELD  since JAN 2024] is here for follow-up/reviews of the CT scan.   Patient notes to have intermittent cramping in his hands in the morning after he takes his diuretics.  Otherwise denies any bone pain.  Patient notes improvement of his joint pains.   Review of Systems  Constitutional:  Negative for chills, diaphoresis, fever, malaise/fatigue and weight loss.  HENT:   Negative for nosebleeds and sore throat.   Eyes:  Negative for double vision.  Respiratory:  Negative for hemoptysis, shortness of breath and wheezing.   Cardiovascular:  Positive for leg swelling. Negative for chest pain, palpitations and orthopnea.  Gastrointestinal:  Negative for abdominal pain, blood in stool, constipation, diarrhea, heartburn, melena, nausea and vomiting.  Genitourinary:  Negative for dysuria, frequency and urgency.  Musculoskeletal:  Positive for joint pain.  Skin: Negative.  Negative for itching and rash.  Neurological:  Negative for dizziness, tingling, focal weakness, weakness and headaches.  Endo/Heme/Allergies:  Does not bruise/bleed easily.  Psychiatric/Behavioral:  Negative for depression. The patient is not nervous/anxious and does not have insomnia.       PAST MEDICAL HISTORY :  Past Medical History:  Diagnosis Date   Allergy    Lung cancer (HCC)    Mild valvular heart disease Nov. 2014   per 2 D Echocardiogram done for persistent leg edema, monitored by cardiology   Peripheral edema    Thoracic aortic aneurysm (TAA)     PAST SURGICAL HISTORY :   Past Surgical History:  Procedure Laterality Date   COLONOSCOPY N/A 10/05/2024   Procedure: COLONOSCOPY;  Surgeon: Onita Elspeth Sharper, DO;  Location: [facility] ENDOSCOPY;  Service: Gastroenterology;  Laterality: N/A;   ELBOW SURGERY Right 2011   IR CV LINE INJECTION  09/11/2021   Left anterior thoracotomy with biopsy  March 2012   POLYPECTOMY  10/05/2024   Procedure: POLYPECTOMY, INTESTINE;  Surgeon: Onita Elspeth Sharper, DO;  Location: [facility] ENDOSCOPY;  Service: Gastroenterology;;   [facility] PLACEMENT  March 2012  TONSILLECTOMY  as child    FAMILY HISTORY :   Family History  Problem Relation Age of Onset   Leukemia Father    Diabetes Mother     SOCIAL HISTORY:   Social History   Tobacco Use   Smoking status: Never   Smokeless tobacco: Never  Vaping Use   Vaping status: Never Used  Substance  Use Topics   Alcohol use: Yes    Alcohol/week: 0.0 standard drinks of alcohol    Comment: social drinker    Drug use: No    ALLERGIES:  is allergic to penicillins.  MEDICATIONS:  Current Outpatient Medications  Medication Sig Dispense Refill   furosemide  (LASIX ) 20 MG tablet TAKE 1 TABLET (20 MG TOTAL) BY MOUTH DAILY AS NEEDED FOR SWELLING 90 tablet 1   Multiple Vitamin (MULTIVITAMIN) capsule Take 1 capsule by mouth daily. Reported on 02/24/2016     spironolactone  (ALDACTONE ) 25 MG tablet TAKE 1 TABLET (25 MG TOTAL) BY MOUTH DAILY. 90 tablet 3   No current facility-administered medications for this visit.   Facility-Administered Medications Ordered in Other Visits  Medication Dose Route Frequency Provider Last Rate Last Admin   heparin  lock flush 100 unit/mL  500 Units Intravenous Once Thalia Turkington R, MD       sodium chloride  0.9 % injection 10 mL  10 mL Intracatheter PRN Marina Cruise, MD   10 mL at 05/27/15 1543   sodium chloride  0.9 % injection 10 mL  10 mL Intracatheter PRN Marina Cruise, MD   10 mL at 06/17/15 1510   sodium chloride  0.9 % injection 10 mL  10 mL Intravenous PRN Wilder Pink, MD   10 mL at 07/08/15 1340   sodium chloride  flush (NS) 0.9 % injection 10 mL  10 mL Intravenous PRN Dasie Tinnie MATSU, NP   10 mL at 09/30/20 1412    PHYSICAL EXAMINATION: ECOG PERFORMANCE STATUS: 0 - Asymptomatic  BP 129/86 (BP Location: Left Arm, Patient Position: Sitting, Cuff Size: Large)   Pulse 77   Temp (!) 97 F (36.1 C) (Tympanic)   Resp 18   Ht 5' 10 (1.778 m)   Wt 201 lb 8 oz (91.4 kg)   SpO2 100%   BMI 28.91 kg/m   Filed Weights   11/06/24 1246  Weight: 201 lb 8 oz (91.4 kg)      Physical Exam HENT:     Head: Normocephalic and atraumatic.     Mouth/Throat:     Pharynx: No oropharyngeal exudate.  Eyes:     Pupils: Pupils are equal, round, and reactive to light.  Cardiovascular:     Rate and Rhythm: Normal rate and regular rhythm.  Pulmonary:      Effort: No respiratory distress.     Breath sounds: No wheezing.  Abdominal:     General: Bowel sounds are normal. There is no distension.     Palpations: Abdomen is soft. There is no mass.     Tenderness: There is no abdominal tenderness. There is no guarding or rebound.  Musculoskeletal:        General: No tenderness. Normal range of motion.     Cervical back: Normal range of motion and neck supple.     Comments: Grade 1 swelling in the legs bilaterally.  Skin:    General: Skin is warm.  Neurological:     Mental Status: He is alert and oriented to person, place, and time.  Psychiatric:        Mood and Affect:  Affect normal.      LABORATORY DATA:  I have reviewed the data as listed    Component Value Date/Time   NA 131 (L) 04/29/2024 1240   NA 137 06/19/2021 1121   NA 138 12/10/2014 1341   K 3.9 04/29/2024 1240   K 3.6 12/10/2014 1341   CL 96 (L) 04/29/2024 1240   CL 102 12/10/2014 1341   CO2 26 04/29/2024 1240   CO2 30 12/10/2014 1341   GLUCOSE 134 (H) 04/29/2024 1240   GLUCOSE 160 (H) 12/10/2014 1341   BUN 15 04/29/2024 1240   BUN 18 06/19/2021 1121   BUN 21 (H) 12/10/2014 1341   CREATININE 0.91 04/29/2024 1240   CREATININE 1.10 03/25/2015 1453   CALCIUM 8.9 04/29/2024 1240   CALCIUM 8.4 (L) 12/10/2014 1341   PROT 7.5 04/29/2024 1240   PROT 7.3 06/19/2021 1121   PROT 6.6 12/10/2014 1341   ALBUMIN 4.3 04/29/2024 1240   ALBUMIN 4.5 06/19/2021 1121   ALBUMIN 3.4 12/10/2014 1341   AST 32 04/29/2024 1240   ALT 23 04/29/2024 1240   ALT 23 12/10/2014 1341   ALKPHOS 37 (L) 04/29/2024 1240   ALKPHOS 50 12/10/2014 1341   BILITOT 1.1 04/29/2024 1240   GFRNONAA >60 04/29/2024 1240   GFRNONAA >60 03/25/2015 1453   GFRAA >60 08/19/2020 1324   GFRAA >60 03/25/2015 1453    No results found for: SPEP, UPEP  Lab Results  Component Value Date   WBC 4.7 04/29/2024   NEUTROABS 2.7 04/29/2024   HGB 15.1 04/29/2024   HCT 43.7 04/29/2024   MCV 91.2 04/29/2024    PLT 159 04/29/2024      Chemistry      Component Value Date/Time   NA 131 (L) 04/29/2024 1240   NA 137 06/19/2021 1121   NA 138 12/10/2014 1341   K 3.9 04/29/2024 1240   K 3.6 12/10/2014 1341   CL 96 (L) 04/29/2024 1240   CL 102 12/10/2014 1341   CO2 26 04/29/2024 1240   CO2 30 12/10/2014 1341   BUN 15 04/29/2024 1240   BUN 18 06/19/2021 1121   BUN 21 (H) 12/10/2014 1341   CREATININE 0.91 04/29/2024 1240   CREATININE 1.10 03/25/2015 1453      Component Value Date/Time   CALCIUM 8.9 04/29/2024 1240   CALCIUM 8.4 (L) 12/10/2014 1341   ALKPHOS 37 (L) 04/29/2024 1240   ALKPHOS 50 12/10/2014 1341   AST 32 04/29/2024 1240   ALT 23 04/29/2024 1240   ALT 23 12/10/2014 1341   BILITOT 1.1 04/29/2024 1240       RADIOGRAPHIC STUDIES: I have personally reviewed the radiological images as listed and agreed with the findings in the report. No results found.   ASSESSMENT & PLAN:  Cancer of hilus of left lung (HCC) # Adenocarcinoma the lung; stage IV;  CT scan CT CAP- No evidence of recurrent or metastatic disease in the chest, abdomen, or pelvis.    # Continue continued surveillance of any therapy at this time.  Otherwise labs adequate. I reviewed the above findings with the patient in detail.  Discussed that repeat scan in 6 months would be reasonable to assess/monitor. Patient is in agreement.    # Thoracic aneurysm- Unchanged ascending thoracic aortic aneurysm measuring 4.6 cmUnchanged ascending thoracic aortic aneurysm measuring 4.6 cm.  S/p evaluation with 09/18/2022 Dr. Donne.    # Chronic fluid retention/ venous stasis--as needed Lasix  Aldactone .  No worsening of renal function.- Stable.   # port  malfunction: fibrin sheath [2022] s/p TPA-; dysfunction noted- OK today. Stable.  Discussed regarding port explantation-await next imaging.  # Prostatomegaly: no symtoms; awaiting PSA today  #Incidental findings on Imaging  CT , 2025:Aortic Atherosclerosis.  I  reviewed/discussed/counseled the patient.    # DISPOSITION:  # port flush in 3 months  # Follow up 6 months-  Fridays-  MD-port flush- labs- cbc/cmp; Magnesium; B12;  CT CAP prior- - Dr.B  # I reviewed the blood work- with the patient in detail; also reviewed the imaging independently [as summarized above]; and with the patient in detail.     Orders Placed This Encounter  Procedures   CT CHEST ABDOMEN PELVIS W CONTRAST    Standing Status:   Future    Expected Date:   05/07/2025    Expiration Date:   11/06/2025    If indicated for the ordered procedure, I authorize the administration of contrast media per Radiology protocol:   Yes    Does the patient have a contrast media/X-ray dye allergy?:   Yes    Preferred imaging location?:   [facility]    If indicated for the ordered procedure, I authorize the administration of oral contrast media per Radiology protocol:   Yes     All questions were answered. The patient knows to call the clinic with any problems, questions or concerns.      Cindy JONELLE Joe, MD 11/06/2024 1:20 PM

## 2024-12-25 ENCOUNTER — Other Ambulatory Visit: Payer: Self-pay | Admitting: Internal Medicine

## 2024-12-25 DIAGNOSIS — R600 Localized edema: Secondary | ICD-10-CM

## 2024-12-25 DIAGNOSIS — C33 Malignant neoplasm of trachea: Secondary | ICD-10-CM

## 2025-05-03 ENCOUNTER — Other Ambulatory Visit

## 2025-05-10 ENCOUNTER — Inpatient Hospital Stay

## 2025-05-10 ENCOUNTER — Inpatient Hospital Stay: Admitting: Internal Medicine

## 2025-10-26 ENCOUNTER — Ambulatory Visit (INDEPENDENT_AMBULATORY_CARE_PROVIDER_SITE_OTHER): Admitting: Vascular Surgery
